# Patient Record
Sex: Female | Born: 1935 | Race: White | Hispanic: No | Smoking: Former smoker
Health system: Southern US, Community
[De-identification: ages and names within clinical notes are randomized; demographics above are authoritative.]

## PROBLEM LIST (undated history)

## (undated) DIAGNOSIS — E039 Hypothyroidism, unspecified: Secondary | ICD-10-CM

## (undated) DIAGNOSIS — E119 Type 2 diabetes mellitus without complications: Secondary | ICD-10-CM

## (undated) DIAGNOSIS — K222 Esophageal obstruction: Secondary | ICD-10-CM

## (undated) DIAGNOSIS — I739 Peripheral vascular disease, unspecified: Secondary | ICD-10-CM

## (undated) DIAGNOSIS — M1990 Unspecified osteoarthritis, unspecified site: Secondary | ICD-10-CM

## (undated) DIAGNOSIS — R0989 Other specified symptoms and signs involving the circulatory and respiratory systems: Secondary | ICD-10-CM

## (undated) DIAGNOSIS — I10 Essential (primary) hypertension: Secondary | ICD-10-CM

## (undated) DIAGNOSIS — I2510 Atherosclerotic heart disease of native coronary artery without angina pectoris: Secondary | ICD-10-CM

## (undated) DIAGNOSIS — K635 Polyp of colon: Secondary | ICD-10-CM

## (undated) DIAGNOSIS — K449 Diaphragmatic hernia without obstruction or gangrene: Secondary | ICD-10-CM

## (undated) DIAGNOSIS — E785 Hyperlipidemia, unspecified: Secondary | ICD-10-CM

## (undated) DIAGNOSIS — I5189 Other ill-defined heart diseases: Secondary | ICD-10-CM

## (undated) DIAGNOSIS — I639 Cerebral infarction, unspecified: Secondary | ICD-10-CM

## (undated) DIAGNOSIS — H9190 Unspecified hearing loss, unspecified ear: Secondary | ICD-10-CM

## (undated) DIAGNOSIS — D649 Anemia, unspecified: Secondary | ICD-10-CM

## (undated) DIAGNOSIS — F419 Anxiety disorder, unspecified: Secondary | ICD-10-CM

## (undated) DIAGNOSIS — Z9289 Personal history of other medical treatment: Secondary | ICD-10-CM

## (undated) DIAGNOSIS — I255 Ischemic cardiomyopathy: Secondary | ICD-10-CM

## (undated) DIAGNOSIS — K219 Gastro-esophageal reflux disease without esophagitis: Secondary | ICD-10-CM

## (undated) DIAGNOSIS — I219 Acute myocardial infarction, unspecified: Secondary | ICD-10-CM

## (undated) DIAGNOSIS — Z950 Presence of cardiac pacemaker: Secondary | ICD-10-CM

## (undated) DIAGNOSIS — K5730 Diverticulosis of large intestine without perforation or abscess without bleeding: Secondary | ICD-10-CM

## (undated) HISTORY — DX: Peripheral vascular disease, unspecified: I73.9

## (undated) HISTORY — DX: Anxiety disorder, unspecified: F41.9

## (undated) HISTORY — PX: BACK SURGERY: SHX140

## (undated) HISTORY — PX: ESOPHAGOGASTRODUODENOSCOPY (EGD) WITH ESOPHAGEAL DILATION: SHX5812

## (undated) HISTORY — PX: CHOLECYSTECTOMY OPEN: SUR202

## (undated) HISTORY — PX: TONSILLECTOMY: SUR1361

## (undated) HISTORY — DX: Cerebral infarction, unspecified: I63.9

## (undated) HISTORY — DX: Gastro-esophageal reflux disease without esophagitis: K21.9

## (undated) HISTORY — DX: Anemia, unspecified: D64.9

## (undated) HISTORY — DX: Other specified symptoms and signs involving the circulatory and respiratory systems: R09.89

## (undated) HISTORY — PX: UPPER GASTROINTESTINAL ENDOSCOPY: SHX188

## (undated) HISTORY — DX: Diaphragmatic hernia without obstruction or gangrene: K44.9

## (undated) HISTORY — DX: Unspecified osteoarthritis, unspecified site: M19.90

## (undated) HISTORY — DX: Acute myocardial infarction, unspecified: I21.9

## (undated) HISTORY — DX: Esophageal obstruction: K22.2

## (undated) HISTORY — DX: Diverticulosis of large intestine without perforation or abscess without bleeding: K57.30

## (undated) HISTORY — PX: COLONOSCOPY: SHX174

## (undated) HISTORY — PX: APPENDECTOMY: SHX54

## (undated) HISTORY — PX: CATARACT EXTRACTION W/ INTRAOCULAR LENS  IMPLANT, BILATERAL: SHX1307

## (undated) HISTORY — DX: Hyperlipidemia, unspecified: E78.5

## (undated) HISTORY — PX: INSERT / REPLACE / REMOVE PACEMAKER: SUR710

## (undated) HISTORY — DX: Polyp of colon: K63.5

## (undated) HISTORY — DX: Unspecified hearing loss, unspecified ear: H91.90

## (undated) HISTORY — PX: CORONARY ARTERY BYPASS GRAFT: SHX141

## (undated) HISTORY — PX: OOPHORECTOMY: SHX86

## (undated) HISTORY — DX: Essential (primary) hypertension: I10

## (undated) HISTORY — PX: ABDOMINAL HYSTERECTOMY: SHX81

---

## 1996-12-06 ENCOUNTER — Encounter (INDEPENDENT_AMBULATORY_CARE_PROVIDER_SITE_OTHER): Payer: Self-pay | Admitting: *Deleted

## 1996-12-06 LAB — CONVERTED CEMR LAB

## 1997-12-31 ENCOUNTER — Encounter (INDEPENDENT_AMBULATORY_CARE_PROVIDER_SITE_OTHER): Payer: Self-pay | Admitting: Gastroenterology

## 1997-12-31 DIAGNOSIS — K649 Unspecified hemorrhoids: Secondary | ICD-10-CM | POA: Insufficient documentation

## 1997-12-31 DIAGNOSIS — K5730 Diverticulosis of large intestine without perforation or abscess without bleeding: Secondary | ICD-10-CM | POA: Insufficient documentation

## 1998-09-20 ENCOUNTER — Ambulatory Visit (HOSPITAL_COMMUNITY): Admission: RE | Admit: 1998-09-20 | Discharge: 1998-09-20 | Payer: Self-pay | Admitting: *Deleted

## 1998-09-26 ENCOUNTER — Ambulatory Visit (HOSPITAL_COMMUNITY): Admission: RE | Admit: 1998-09-26 | Discharge: 1998-09-26 | Payer: Self-pay | Admitting: Internal Medicine

## 1998-09-26 ENCOUNTER — Encounter: Payer: Self-pay | Admitting: Internal Medicine

## 1999-02-09 ENCOUNTER — Other Ambulatory Visit: Admission: RE | Admit: 1999-02-09 | Discharge: 1999-02-09 | Payer: Self-pay | Admitting: *Deleted

## 1999-11-12 ENCOUNTER — Ambulatory Visit (HOSPITAL_COMMUNITY): Admission: RE | Admit: 1999-11-12 | Discharge: 1999-11-12 | Payer: Self-pay | Admitting: Orthopedic Surgery

## 1999-11-12 ENCOUNTER — Encounter: Payer: Self-pay | Admitting: Orthopedic Surgery

## 1999-12-31 ENCOUNTER — Encounter: Payer: Self-pay | Admitting: Orthopedic Surgery

## 1999-12-31 ENCOUNTER — Ambulatory Visit (HOSPITAL_COMMUNITY): Admission: RE | Admit: 1999-12-31 | Discharge: 1999-12-31 | Payer: Self-pay | Admitting: Orthopedic Surgery

## 2000-01-07 HISTORY — PX: LUMBAR DISC SURGERY: SHX700

## 2000-03-31 ENCOUNTER — Encounter: Payer: Self-pay | Admitting: Internal Medicine

## 2000-03-31 ENCOUNTER — Ambulatory Visit (HOSPITAL_COMMUNITY): Admission: RE | Admit: 2000-03-31 | Discharge: 2000-03-31 | Payer: Self-pay | Admitting: Internal Medicine

## 2000-04-05 ENCOUNTER — Encounter: Payer: Self-pay | Admitting: Internal Medicine

## 2000-04-05 ENCOUNTER — Ambulatory Visit (HOSPITAL_COMMUNITY): Admission: RE | Admit: 2000-04-05 | Discharge: 2000-04-05 | Payer: Self-pay | Admitting: Internal Medicine

## 2000-07-05 ENCOUNTER — Other Ambulatory Visit: Admission: RE | Admit: 2000-07-05 | Discharge: 2000-07-05 | Payer: Self-pay | Admitting: *Deleted

## 2000-09-16 ENCOUNTER — Encounter: Payer: Self-pay | Admitting: Orthopedic Surgery

## 2000-09-16 ENCOUNTER — Ambulatory Visit (HOSPITAL_COMMUNITY): Admission: RE | Admit: 2000-09-16 | Discharge: 2000-09-16 | Payer: Self-pay | Admitting: Orthopedic Surgery

## 2000-09-30 ENCOUNTER — Encounter: Payer: Self-pay | Admitting: Orthopedic Surgery

## 2000-09-30 ENCOUNTER — Ambulatory Visit (HOSPITAL_COMMUNITY): Admission: RE | Admit: 2000-09-30 | Discharge: 2000-09-30 | Payer: Self-pay | Admitting: Orthopedic Surgery

## 2000-10-21 ENCOUNTER — Ambulatory Visit (HOSPITAL_COMMUNITY): Admission: RE | Admit: 2000-10-21 | Discharge: 2000-10-21 | Payer: Self-pay | Admitting: Orthopedic Surgery

## 2000-10-21 ENCOUNTER — Encounter: Payer: Self-pay | Admitting: Orthopedic Surgery

## 2001-01-03 ENCOUNTER — Encounter: Admission: RE | Admit: 2001-01-03 | Discharge: 2001-01-03 | Payer: Self-pay | Admitting: Neurology

## 2001-01-03 ENCOUNTER — Encounter: Payer: Self-pay | Admitting: Neurology

## 2001-01-17 ENCOUNTER — Encounter: Admission: RE | Admit: 2001-01-17 | Discharge: 2001-01-17 | Payer: Self-pay | Admitting: Neurology

## 2001-01-17 ENCOUNTER — Encounter: Payer: Self-pay | Admitting: Neurology

## 2001-02-01 ENCOUNTER — Ambulatory Visit (HOSPITAL_COMMUNITY): Admission: RE | Admit: 2001-02-01 | Discharge: 2001-02-01 | Payer: Self-pay | Admitting: Neurology

## 2001-02-01 ENCOUNTER — Encounter: Payer: Self-pay | Admitting: Neurology

## 2001-02-16 ENCOUNTER — Ambulatory Visit (HOSPITAL_COMMUNITY): Admission: RE | Admit: 2001-02-16 | Discharge: 2001-02-16 | Payer: Self-pay | Admitting: Cardiology

## 2001-02-22 ENCOUNTER — Encounter: Payer: Self-pay | Admitting: Surgery

## 2001-02-24 ENCOUNTER — Encounter: Payer: Self-pay | Admitting: Surgery

## 2001-02-24 ENCOUNTER — Inpatient Hospital Stay (HOSPITAL_COMMUNITY): Admission: RE | Admit: 2001-02-24 | Discharge: 2001-02-28 | Payer: Self-pay | Admitting: Surgery

## 2001-02-25 ENCOUNTER — Encounter: Payer: Self-pay | Admitting: Surgery

## 2001-02-26 ENCOUNTER — Encounter: Payer: Self-pay | Admitting: Surgery

## 2001-06-27 ENCOUNTER — Encounter: Payer: Self-pay | Admitting: Internal Medicine

## 2001-06-27 ENCOUNTER — Ambulatory Visit (HOSPITAL_COMMUNITY): Admission: RE | Admit: 2001-06-27 | Discharge: 2001-06-27 | Payer: Self-pay | Admitting: Internal Medicine

## 2001-08-02 ENCOUNTER — Ambulatory Visit (HOSPITAL_COMMUNITY): Admission: RE | Admit: 2001-08-02 | Discharge: 2001-08-02 | Payer: Self-pay | Admitting: Neurological Surgery

## 2001-08-02 ENCOUNTER — Encounter: Payer: Self-pay | Admitting: Neurological Surgery

## 2001-08-24 ENCOUNTER — Encounter: Payer: Self-pay | Admitting: Neurological Surgery

## 2001-08-24 ENCOUNTER — Encounter: Admission: RE | Admit: 2001-08-24 | Discharge: 2001-08-24 | Payer: Self-pay | Admitting: Neurological Surgery

## 2001-12-01 ENCOUNTER — Encounter: Payer: Self-pay | Admitting: Neurological Surgery

## 2001-12-01 ENCOUNTER — Encounter: Admission: RE | Admit: 2001-12-01 | Discharge: 2001-12-01 | Payer: Self-pay | Admitting: Neurological Surgery

## 2002-04-25 ENCOUNTER — Ambulatory Visit (HOSPITAL_COMMUNITY): Admission: RE | Admit: 2002-04-25 | Discharge: 2002-04-25 | Payer: Self-pay | Admitting: Internal Medicine

## 2002-04-26 ENCOUNTER — Ambulatory Visit (HOSPITAL_COMMUNITY): Admission: RE | Admit: 2002-04-26 | Discharge: 2002-04-26 | Payer: Self-pay | Admitting: Internal Medicine

## 2002-04-26 ENCOUNTER — Encounter: Payer: Self-pay | Admitting: Internal Medicine

## 2002-05-30 ENCOUNTER — Ambulatory Visit (HOSPITAL_COMMUNITY): Admission: RE | Admit: 2002-05-30 | Discharge: 2002-05-30 | Payer: Self-pay | Admitting: Internal Medicine

## 2002-05-30 ENCOUNTER — Encounter: Payer: Self-pay | Admitting: Internal Medicine

## 2002-10-19 ENCOUNTER — Encounter: Admission: RE | Admit: 2002-10-19 | Discharge: 2002-10-19 | Payer: Self-pay | Admitting: Internal Medicine

## 2002-10-19 ENCOUNTER — Encounter: Payer: Self-pay | Admitting: Internal Medicine

## 2003-01-21 ENCOUNTER — Encounter: Payer: Self-pay | Admitting: Neurological Surgery

## 2003-01-21 ENCOUNTER — Encounter: Admission: RE | Admit: 2003-01-21 | Discharge: 2003-01-21 | Payer: Self-pay | Admitting: Neurological Surgery

## 2003-01-28 ENCOUNTER — Encounter: Payer: Self-pay | Admitting: Neurological Surgery

## 2003-01-28 ENCOUNTER — Encounter: Admission: RE | Admit: 2003-01-28 | Discharge: 2003-01-28 | Payer: Self-pay | Admitting: Neurological Surgery

## 2003-06-17 ENCOUNTER — Encounter: Payer: Self-pay | Admitting: Neurological Surgery

## 2003-06-17 ENCOUNTER — Encounter: Admission: RE | Admit: 2003-06-17 | Discharge: 2003-06-17 | Payer: Self-pay | Admitting: Neurological Surgery

## 2003-08-05 ENCOUNTER — Encounter: Admission: RE | Admit: 2003-08-05 | Discharge: 2003-08-05 | Payer: Self-pay | Admitting: Neurological Surgery

## 2003-08-05 ENCOUNTER — Encounter: Payer: Self-pay | Admitting: Neurological Surgery

## 2003-10-03 ENCOUNTER — Encounter: Admission: RE | Admit: 2003-10-03 | Discharge: 2003-10-03 | Payer: Self-pay | Admitting: Neurological Surgery

## 2003-12-15 ENCOUNTER — Emergency Department (HOSPITAL_COMMUNITY): Admission: AD | Admit: 2003-12-15 | Discharge: 2003-12-15 | Payer: Self-pay | Admitting: Family Medicine

## 2004-01-06 ENCOUNTER — Encounter: Admission: RE | Admit: 2004-01-06 | Discharge: 2004-01-06 | Payer: Self-pay | Admitting: Neurological Surgery

## 2004-05-06 ENCOUNTER — Emergency Department (HOSPITAL_COMMUNITY): Admission: EM | Admit: 2004-05-06 | Discharge: 2004-05-06 | Payer: Self-pay | Admitting: Family Medicine

## 2004-05-11 ENCOUNTER — Emergency Department (HOSPITAL_COMMUNITY): Admission: EM | Admit: 2004-05-11 | Discharge: 2004-05-11 | Payer: Self-pay | Admitting: Family Medicine

## 2004-06-10 ENCOUNTER — Ambulatory Visit (HOSPITAL_COMMUNITY): Admission: RE | Admit: 2004-06-10 | Discharge: 2004-06-10 | Payer: Self-pay | Admitting: Plastic Surgery

## 2004-06-10 ENCOUNTER — Encounter (INDEPENDENT_AMBULATORY_CARE_PROVIDER_SITE_OTHER): Payer: Self-pay | Admitting: Specialist

## 2004-06-10 ENCOUNTER — Ambulatory Visit (HOSPITAL_BASED_OUTPATIENT_CLINIC_OR_DEPARTMENT_OTHER): Admission: RE | Admit: 2004-06-10 | Discharge: 2004-06-10 | Payer: Self-pay | Admitting: Plastic Surgery

## 2004-06-11 ENCOUNTER — Encounter: Admission: RE | Admit: 2004-06-11 | Discharge: 2004-06-11 | Payer: Self-pay | Admitting: Neurological Surgery

## 2004-10-21 ENCOUNTER — Ambulatory Visit: Payer: Self-pay | Admitting: Internal Medicine

## 2004-11-18 ENCOUNTER — Encounter: Admission: RE | Admit: 2004-11-18 | Discharge: 2004-11-18 | Payer: Self-pay | Admitting: Neurological Surgery

## 2005-01-08 ENCOUNTER — Ambulatory Visit: Payer: Self-pay | Admitting: Internal Medicine

## 2005-01-28 ENCOUNTER — Encounter: Admission: RE | Admit: 2005-01-28 | Discharge: 2005-01-28 | Payer: Self-pay | Admitting: Neurological Surgery

## 2005-03-30 ENCOUNTER — Ambulatory Visit: Payer: Self-pay | Admitting: *Deleted

## 2005-04-05 ENCOUNTER — Ambulatory Visit: Payer: Self-pay | Admitting: Internal Medicine

## 2005-04-13 ENCOUNTER — Ambulatory Visit: Payer: Self-pay | Admitting: Internal Medicine

## 2005-05-22 ENCOUNTER — Emergency Department (HOSPITAL_COMMUNITY): Admission: EM | Admit: 2005-05-22 | Discharge: 2005-05-22 | Payer: Self-pay | Admitting: Emergency Medicine

## 2005-05-25 ENCOUNTER — Emergency Department (HOSPITAL_COMMUNITY): Admission: EM | Admit: 2005-05-25 | Discharge: 2005-05-25 | Payer: Self-pay | Admitting: Family Medicine

## 2005-07-14 ENCOUNTER — Emergency Department (HOSPITAL_COMMUNITY): Admission: EM | Admit: 2005-07-14 | Discharge: 2005-07-14 | Payer: Self-pay | Admitting: Emergency Medicine

## 2005-08-17 ENCOUNTER — Ambulatory Visit: Payer: Self-pay | Admitting: *Deleted

## 2005-08-24 ENCOUNTER — Ambulatory Visit: Payer: Self-pay | Admitting: Internal Medicine

## 2005-09-07 ENCOUNTER — Ambulatory Visit: Payer: Self-pay

## 2005-09-20 ENCOUNTER — Encounter: Admission: RE | Admit: 2005-09-20 | Discharge: 2005-09-20 | Payer: Self-pay | Admitting: Neurosurgery

## 2005-10-06 ENCOUNTER — Ambulatory Visit: Payer: Self-pay | Admitting: Internal Medicine

## 2005-10-18 ENCOUNTER — Ambulatory Visit: Payer: Self-pay | Admitting: Internal Medicine

## 2005-12-08 ENCOUNTER — Ambulatory Visit: Payer: Self-pay | Admitting: *Deleted

## 2005-12-15 ENCOUNTER — Ambulatory Visit: Payer: Self-pay | Admitting: Internal Medicine

## 2005-12-21 ENCOUNTER — Ambulatory Visit: Payer: Self-pay | Admitting: Internal Medicine

## 2006-01-14 ENCOUNTER — Ambulatory Visit: Payer: Self-pay | Admitting: Internal Medicine

## 2006-01-25 ENCOUNTER — Ambulatory Visit: Payer: Self-pay | Admitting: *Deleted

## 2006-02-16 ENCOUNTER — Encounter (HOSPITAL_COMMUNITY): Admission: RE | Admit: 2006-02-16 | Discharge: 2006-05-17 | Payer: Self-pay | Admitting: Obstetrics and Gynecology

## 2006-02-22 ENCOUNTER — Ambulatory Visit: Payer: Self-pay | Admitting: Internal Medicine

## 2006-03-15 ENCOUNTER — Encounter: Admission: RE | Admit: 2006-03-15 | Discharge: 2006-03-15 | Payer: Self-pay | Admitting: Neurosurgery

## 2006-04-14 ENCOUNTER — Ambulatory Visit: Payer: Self-pay | Admitting: Internal Medicine

## 2006-04-26 ENCOUNTER — Ambulatory Visit: Payer: Self-pay | Admitting: *Deleted

## 2006-04-28 ENCOUNTER — Ambulatory Visit: Payer: Self-pay | Admitting: Internal Medicine

## 2006-04-28 ENCOUNTER — Ambulatory Visit: Payer: Self-pay | Admitting: *Deleted

## 2006-05-18 ENCOUNTER — Encounter (HOSPITAL_COMMUNITY): Admission: RE | Admit: 2006-05-18 | Discharge: 2006-08-16 | Payer: Self-pay | Admitting: Obstetrics and Gynecology

## 2006-06-14 ENCOUNTER — Ambulatory Visit: Payer: Self-pay | Admitting: Internal Medicine

## 2006-06-28 ENCOUNTER — Ambulatory Visit (HOSPITAL_COMMUNITY): Admission: RE | Admit: 2006-06-28 | Discharge: 2006-06-28 | Payer: Self-pay | Admitting: Internal Medicine

## 2006-06-28 ENCOUNTER — Encounter: Payer: Self-pay | Admitting: Internal Medicine

## 2006-07-05 ENCOUNTER — Ambulatory Visit: Payer: Self-pay | Admitting: Internal Medicine

## 2006-09-07 ENCOUNTER — Encounter: Admission: RE | Admit: 2006-09-07 | Discharge: 2006-09-07 | Payer: Self-pay | Admitting: Neurosurgery

## 2006-11-01 ENCOUNTER — Ambulatory Visit: Payer: Self-pay | Admitting: *Deleted

## 2006-11-15 ENCOUNTER — Ambulatory Visit: Payer: Self-pay

## 2006-11-15 ENCOUNTER — Ambulatory Visit: Payer: Self-pay | Admitting: *Deleted

## 2006-12-01 ENCOUNTER — Encounter (HOSPITAL_COMMUNITY): Admission: RE | Admit: 2006-12-01 | Discharge: 2007-03-01 | Payer: Self-pay | Admitting: Obstetrics and Gynecology

## 2007-01-07 ENCOUNTER — Emergency Department (HOSPITAL_COMMUNITY): Admission: EM | Admit: 2007-01-07 | Discharge: 2007-01-08 | Payer: Self-pay | Admitting: Emergency Medicine

## 2007-01-16 ENCOUNTER — Emergency Department (HOSPITAL_COMMUNITY): Admission: EM | Admit: 2007-01-16 | Discharge: 2007-01-16 | Payer: Self-pay | Admitting: Family Medicine

## 2007-01-26 ENCOUNTER — Encounter: Payer: Self-pay | Admitting: Internal Medicine

## 2007-01-26 ENCOUNTER — Ambulatory Visit (HOSPITAL_COMMUNITY): Admission: EM | Admit: 2007-01-26 | Discharge: 2007-01-27 | Payer: Self-pay | Admitting: Emergency Medicine

## 2007-02-02 ENCOUNTER — Ambulatory Visit: Payer: Self-pay | Admitting: Internal Medicine

## 2007-02-16 ENCOUNTER — Ambulatory Visit (HOSPITAL_COMMUNITY): Admission: RE | Admit: 2007-02-16 | Discharge: 2007-02-16 | Payer: Self-pay | Admitting: Internal Medicine

## 2007-02-16 ENCOUNTER — Encounter: Payer: Self-pay | Admitting: Internal Medicine

## 2007-02-16 DIAGNOSIS — K449 Diaphragmatic hernia without obstruction or gangrene: Secondary | ICD-10-CM | POA: Insufficient documentation

## 2007-02-21 ENCOUNTER — Encounter: Admission: RE | Admit: 2007-02-21 | Discharge: 2007-02-21 | Payer: Self-pay | Admitting: Neurosurgery

## 2007-03-16 DIAGNOSIS — I252 Old myocardial infarction: Secondary | ICD-10-CM

## 2007-03-16 DIAGNOSIS — E1142 Type 2 diabetes mellitus with diabetic polyneuropathy: Secondary | ICD-10-CM

## 2007-03-16 DIAGNOSIS — E781 Pure hyperglyceridemia: Secondary | ICD-10-CM | POA: Insufficient documentation

## 2007-03-16 DIAGNOSIS — K222 Esophageal obstruction: Secondary | ICD-10-CM | POA: Insufficient documentation

## 2007-03-16 DIAGNOSIS — K219 Gastro-esophageal reflux disease without esophagitis: Secondary | ICD-10-CM

## 2007-03-28 ENCOUNTER — Ambulatory Visit: Payer: Self-pay | Admitting: *Deleted

## 2007-04-04 ENCOUNTER — Ambulatory Visit: Payer: Self-pay | Admitting: *Deleted

## 2007-04-04 LAB — CONVERTED CEMR LAB
Chloride: 104 meq/L (ref 96–112)
Creatinine, Ser: 0.9 mg/dL (ref 0.4–1.2)
GFR calc non Af Amer: 65 mL/min
Glucose, Bld: 115 mg/dL — ABNORMAL HIGH (ref 70–99)
Potassium: 4.3 meq/L (ref 3.5–5.1)
Sodium: 140 meq/L (ref 135–145)

## 2007-04-13 ENCOUNTER — Ambulatory Visit: Payer: Self-pay | Admitting: Internal Medicine

## 2007-04-13 ENCOUNTER — Observation Stay (HOSPITAL_COMMUNITY): Admission: EM | Admit: 2007-04-13 | Discharge: 2007-04-14 | Payer: Self-pay | Admitting: Emergency Medicine

## 2007-04-27 ENCOUNTER — Ambulatory Visit: Payer: Self-pay

## 2007-05-02 ENCOUNTER — Ambulatory Visit: Payer: Self-pay | Admitting: Cardiovascular Disease

## 2007-05-11 ENCOUNTER — Encounter: Payer: Self-pay | Admitting: Internal Medicine

## 2007-05-16 ENCOUNTER — Telehealth (INDEPENDENT_AMBULATORY_CARE_PROVIDER_SITE_OTHER): Payer: Self-pay | Admitting: *Deleted

## 2007-05-17 ENCOUNTER — Encounter: Payer: Self-pay | Admitting: Internal Medicine

## 2007-05-19 ENCOUNTER — Telehealth (INDEPENDENT_AMBULATORY_CARE_PROVIDER_SITE_OTHER): Payer: Self-pay | Admitting: *Deleted

## 2007-06-14 ENCOUNTER — Ambulatory Visit: Payer: Self-pay | Admitting: *Deleted

## 2007-06-14 ENCOUNTER — Ambulatory Visit: Payer: Self-pay | Admitting: Internal Medicine

## 2007-06-14 LAB — CONVERTED CEMR LAB
ALT: 33 units/L (ref 0–35)
AST: 29 units/L (ref 0–37)
Alkaline Phosphatase: 74 units/L (ref 39–117)
Cholesterol: 204 mg/dL (ref 0–200)
Total CHOL/HDL Ratio: 4.9
VLDL: 43 mg/dL — ABNORMAL HIGH (ref 0–40)

## 2007-06-19 ENCOUNTER — Telehealth (INDEPENDENT_AMBULATORY_CARE_PROVIDER_SITE_OTHER): Payer: Self-pay | Admitting: *Deleted

## 2007-07-11 ENCOUNTER — Encounter (HOSPITAL_COMMUNITY): Admission: RE | Admit: 2007-07-11 | Discharge: 2007-09-11 | Payer: Self-pay | Admitting: Obstetrics and Gynecology

## 2007-07-11 ENCOUNTER — Encounter: Payer: Self-pay | Admitting: Internal Medicine

## 2007-08-01 ENCOUNTER — Encounter: Admission: RE | Admit: 2007-08-01 | Discharge: 2007-08-01 | Payer: Self-pay | Admitting: Neurological Surgery

## 2007-09-04 ENCOUNTER — Ambulatory Visit: Payer: Self-pay | Admitting: Psychology

## 2007-09-04 ENCOUNTER — Encounter: Admission: RE | Admit: 2007-09-04 | Discharge: 2007-09-04 | Payer: Self-pay | Admitting: Neurology

## 2007-09-07 ENCOUNTER — Ambulatory Visit: Payer: Self-pay | Admitting: Internal Medicine

## 2007-09-07 DIAGNOSIS — I10 Essential (primary) hypertension: Secondary | ICD-10-CM

## 2007-09-07 LAB — CONVERTED CEMR LAB
HDL goal, serum: 40 mg/dL
LDL Goal: 100 mg/dL

## 2007-09-08 ENCOUNTER — Ambulatory Visit: Payer: Self-pay | Admitting: Internal Medicine

## 2007-09-12 ENCOUNTER — Encounter (INDEPENDENT_AMBULATORY_CARE_PROVIDER_SITE_OTHER): Payer: Self-pay | Admitting: *Deleted

## 2007-09-12 LAB — CONVERTED CEMR LAB
AST: 44 units/L — ABNORMAL HIGH (ref 0–37)
Basophils Absolute: 0 10*3/uL (ref 0.0–0.1)
Cholesterol: 185 mg/dL (ref 0–200)
Creatinine,U: 141.2 mg/dL
Eosinophils Absolute: 0.2 10*3/uL (ref 0.0–0.6)
Eosinophils Relative: 2.5 % (ref 0.0–5.0)
HDL: 35.7 mg/dL — ABNORMAL LOW (ref >39.0)
Lymphocytes Relative: 34.7 % (ref 12.0–46.0)
MCHC: 34.6 g/dL (ref 30.0–36.0)
MCV: 89.7 fL (ref 78.0–100.0)
Microalb, Ur: 1 mg/dL (ref 0.0–1.9)
Monocytes Relative: 10.1 % (ref 3.0–11.0)
Neutro Abs: 3.4 10*3/uL (ref 1.4–7.7)
Platelets: 266 10*3/uL (ref 150–400)
Potassium: 4.3 meq/L (ref 3.5–5.1)
RBC: 4.67 M/uL (ref 3.87–5.11)
TSH: 0.03 microintl units/mL — ABNORMAL LOW (ref 0.35–5.50)
Total CHOL/HDL Ratio: 5.2
Triglycerides: 181 mg/dL — ABNORMAL HIGH (ref 0–149)

## 2007-09-14 ENCOUNTER — Encounter: Payer: Self-pay | Admitting: Internal Medicine

## 2007-09-25 ENCOUNTER — Ambulatory Visit: Payer: Self-pay | Admitting: Internal Medicine

## 2007-09-25 DIAGNOSIS — E782 Mixed hyperlipidemia: Secondary | ICD-10-CM | POA: Insufficient documentation

## 2007-09-25 DIAGNOSIS — E039 Hypothyroidism, unspecified: Secondary | ICD-10-CM | POA: Insufficient documentation

## 2007-10-09 ENCOUNTER — Ambulatory Visit: Payer: Self-pay | Admitting: Cardiovascular Disease

## 2008-01-04 ENCOUNTER — Ambulatory Visit: Payer: Self-pay | Admitting: Internal Medicine

## 2008-01-04 ENCOUNTER — Encounter (INDEPENDENT_AMBULATORY_CARE_PROVIDER_SITE_OTHER): Payer: Self-pay | Admitting: *Deleted

## 2008-01-04 DIAGNOSIS — H9190 Unspecified hearing loss, unspecified ear: Secondary | ICD-10-CM | POA: Insufficient documentation

## 2008-01-04 DIAGNOSIS — I739 Peripheral vascular disease, unspecified: Secondary | ICD-10-CM

## 2008-01-07 LAB — CONVERTED CEMR LAB
Creatinine, Ser: 0.9 mg/dL (ref 0.4–1.2)
Microalb, Ur: 2.1 mg/dL — ABNORMAL HIGH (ref 0.0–1.9)

## 2008-01-08 ENCOUNTER — Encounter (INDEPENDENT_AMBULATORY_CARE_PROVIDER_SITE_OTHER): Payer: Self-pay | Admitting: *Deleted

## 2008-01-10 ENCOUNTER — Encounter: Payer: Self-pay | Admitting: Internal Medicine

## 2008-01-10 ENCOUNTER — Ambulatory Visit: Payer: Self-pay

## 2008-01-12 ENCOUNTER — Encounter: Admission: RE | Admit: 2008-01-12 | Discharge: 2008-01-12 | Payer: Self-pay | Admitting: Neurological Surgery

## 2008-02-01 ENCOUNTER — Ambulatory Visit: Payer: Self-pay | Admitting: Cardiology

## 2008-02-14 DIAGNOSIS — J209 Acute bronchitis, unspecified: Secondary | ICD-10-CM | POA: Insufficient documentation

## 2008-02-14 DIAGNOSIS — K208 Other esophagitis: Secondary | ICD-10-CM

## 2008-02-14 DIAGNOSIS — M1990 Unspecified osteoarthritis, unspecified site: Secondary | ICD-10-CM | POA: Insufficient documentation

## 2008-02-14 DIAGNOSIS — K5909 Other constipation: Secondary | ICD-10-CM

## 2008-02-14 DIAGNOSIS — F419 Anxiety disorder, unspecified: Secondary | ICD-10-CM

## 2008-02-21 ENCOUNTER — Ambulatory Visit: Payer: Self-pay | Admitting: Internal Medicine

## 2008-02-29 ENCOUNTER — Ambulatory Visit: Payer: Self-pay

## 2008-03-01 ENCOUNTER — Ambulatory Visit: Payer: Self-pay | Admitting: Internal Medicine

## 2008-03-02 LAB — CONVERTED CEMR LAB
BUN: 18 mg/dL (ref 6–23)
Creatinine, Ser: 0.8 mg/dL (ref 0.4–1.2)
Potassium: 4.6 meq/L (ref 3.5–5.1)

## 2008-03-04 ENCOUNTER — Encounter (INDEPENDENT_AMBULATORY_CARE_PROVIDER_SITE_OTHER): Payer: Self-pay | Admitting: *Deleted

## 2008-03-05 ENCOUNTER — Encounter (HOSPITAL_COMMUNITY): Admission: RE | Admit: 2008-03-05 | Discharge: 2008-03-05 | Payer: Self-pay | Admitting: Obstetrics and Gynecology

## 2008-03-07 ENCOUNTER — Telehealth: Payer: Self-pay | Admitting: Internal Medicine

## 2008-03-07 ENCOUNTER — Telehealth (INDEPENDENT_AMBULATORY_CARE_PROVIDER_SITE_OTHER): Payer: Self-pay | Admitting: *Deleted

## 2008-04-11 ENCOUNTER — Telehealth (INDEPENDENT_AMBULATORY_CARE_PROVIDER_SITE_OTHER): Payer: Self-pay | Admitting: *Deleted

## 2008-06-03 ENCOUNTER — Telehealth (INDEPENDENT_AMBULATORY_CARE_PROVIDER_SITE_OTHER): Payer: Self-pay | Admitting: *Deleted

## 2008-06-03 ENCOUNTER — Encounter: Payer: Self-pay | Admitting: Internal Medicine

## 2008-06-19 ENCOUNTER — Ambulatory Visit: Payer: Self-pay | Admitting: Internal Medicine

## 2008-06-19 DIAGNOSIS — R1310 Dysphagia, unspecified: Secondary | ICD-10-CM | POA: Insufficient documentation

## 2008-06-19 DIAGNOSIS — R1319 Other dysphagia: Secondary | ICD-10-CM | POA: Insufficient documentation

## 2008-06-25 ENCOUNTER — Ambulatory Visit (HOSPITAL_COMMUNITY): Admission: RE | Admit: 2008-06-25 | Discharge: 2008-06-25 | Payer: Self-pay | Admitting: Internal Medicine

## 2008-06-26 ENCOUNTER — Encounter: Payer: Self-pay | Admitting: Internal Medicine

## 2008-07-01 ENCOUNTER — Ambulatory Visit: Payer: Self-pay | Admitting: Internal Medicine

## 2008-07-05 ENCOUNTER — Encounter: Payer: Self-pay | Admitting: Internal Medicine

## 2008-07-08 ENCOUNTER — Telehealth (INDEPENDENT_AMBULATORY_CARE_PROVIDER_SITE_OTHER): Payer: Self-pay | Admitting: *Deleted

## 2008-07-11 ENCOUNTER — Telehealth (INDEPENDENT_AMBULATORY_CARE_PROVIDER_SITE_OTHER): Payer: Self-pay | Admitting: *Deleted

## 2008-07-17 ENCOUNTER — Ambulatory Visit: Payer: Self-pay | Admitting: Internal Medicine

## 2008-07-18 ENCOUNTER — Ambulatory Visit: Payer: Self-pay | Admitting: Internal Medicine

## 2008-07-21 LAB — CONVERTED CEMR LAB
ALT: 31 units/L (ref 0–35)
Cholesterol: 169 mg/dL (ref 0–200)
Eosinophils Relative: 4 % (ref 0.0–5.0)
HCT: 40.4 % (ref 36.0–46.0)
Hemoglobin: 14.3 g/dL (ref 12.0–15.0)
Hgb A1c MFr Bld: 6.9 % — ABNORMAL HIGH (ref 4.6–6.0)
Microalb Creat Ratio: 22.8 mg/g (ref 0.0–30.0)
Monocytes Absolute: 0.5 10*3/uL (ref 0.1–1.0)
Monocytes Relative: 8.5 % (ref 3.0–12.0)
Neutro Abs: 3.8 10*3/uL (ref 1.4–7.7)
Potassium: 4.2 meq/L (ref 3.5–5.1)
RDW: 12.5 % (ref 11.5–14.6)
Total CHOL/HDL Ratio: 5.7
Total Protein: 6.6 g/dL (ref 6.0–8.3)
WBC: 6.1 10*3/uL (ref 4.5–10.5)

## 2008-07-22 ENCOUNTER — Encounter (INDEPENDENT_AMBULATORY_CARE_PROVIDER_SITE_OTHER): Payer: Self-pay | Admitting: *Deleted

## 2008-07-25 ENCOUNTER — Encounter (INDEPENDENT_AMBULATORY_CARE_PROVIDER_SITE_OTHER): Payer: Self-pay | Admitting: *Deleted

## 2008-07-25 ENCOUNTER — Ambulatory Visit: Payer: Self-pay | Admitting: Internal Medicine

## 2008-07-25 LAB — CONVERTED CEMR LAB: LDL Goal: 70 mg/dL

## 2008-07-26 ENCOUNTER — Encounter: Admission: RE | Admit: 2008-07-26 | Discharge: 2008-07-26 | Payer: Self-pay | Admitting: Neurological Surgery

## 2008-07-29 ENCOUNTER — Encounter (INDEPENDENT_AMBULATORY_CARE_PROVIDER_SITE_OTHER): Payer: Self-pay | Admitting: *Deleted

## 2008-09-10 ENCOUNTER — Encounter: Payer: Self-pay | Admitting: Internal Medicine

## 2008-09-25 ENCOUNTER — Encounter: Payer: Self-pay | Admitting: Internal Medicine

## 2008-10-01 ENCOUNTER — Ambulatory Visit: Payer: Self-pay | Admitting: Cardiovascular Disease

## 2008-12-04 ENCOUNTER — Telehealth: Payer: Self-pay | Admitting: Internal Medicine

## 2009-01-02 ENCOUNTER — Encounter: Payer: Self-pay | Admitting: Internal Medicine

## 2009-01-03 ENCOUNTER — Telehealth (INDEPENDENT_AMBULATORY_CARE_PROVIDER_SITE_OTHER): Payer: Self-pay | Admitting: *Deleted

## 2009-01-14 ENCOUNTER — Encounter: Payer: Self-pay | Admitting: Internal Medicine

## 2009-01-16 ENCOUNTER — Encounter: Admission: RE | Admit: 2009-01-16 | Discharge: 2009-01-16 | Payer: Self-pay | Admitting: Neurological Surgery

## 2009-01-22 ENCOUNTER — Ambulatory Visit (HOSPITAL_COMMUNITY): Admission: RE | Admit: 2009-01-22 | Discharge: 2009-01-22 | Payer: Self-pay | Admitting: Obstetrics and Gynecology

## 2009-01-30 ENCOUNTER — Ambulatory Visit: Payer: Self-pay | Admitting: Internal Medicine

## 2009-01-30 DIAGNOSIS — E119 Type 2 diabetes mellitus without complications: Secondary | ICD-10-CM

## 2009-01-30 LAB — CONVERTED CEMR LAB
Bilirubin Urine: NEGATIVE
Glucose, Urine, Semiquant: NEGATIVE
Urobilinogen, UA: 0.2
pH: 6

## 2009-01-31 ENCOUNTER — Encounter: Payer: Self-pay | Admitting: Internal Medicine

## 2009-02-01 ENCOUNTER — Encounter: Payer: Self-pay | Admitting: Internal Medicine

## 2009-02-05 ENCOUNTER — Telehealth (INDEPENDENT_AMBULATORY_CARE_PROVIDER_SITE_OTHER): Payer: Self-pay | Admitting: *Deleted

## 2009-03-05 ENCOUNTER — Encounter: Admission: RE | Admit: 2009-03-05 | Discharge: 2009-03-05 | Payer: Self-pay | Admitting: Neurological Surgery

## 2009-03-12 ENCOUNTER — Encounter: Payer: Self-pay | Admitting: Internal Medicine

## 2009-03-31 ENCOUNTER — Telehealth: Payer: Self-pay | Admitting: Internal Medicine

## 2009-04-02 ENCOUNTER — Encounter: Payer: Self-pay | Admitting: Internal Medicine

## 2009-04-07 ENCOUNTER — Ambulatory Visit: Payer: Self-pay | Admitting: Internal Medicine

## 2009-04-08 LAB — CONVERTED CEMR LAB
AST: 33 units/L (ref 0–37)
Albumin: 3.9 g/dL (ref 3.5–5.2)
Alkaline Phosphatase: 34 units/L — ABNORMAL LOW (ref 39–117)
Basophils Absolute: 0 10*3/uL (ref 0.0–0.1)
Bilirubin, Direct: 0.2 mg/dL (ref 0.0–0.3)
Calcium: 9.4 mg/dL (ref 8.4–10.5)
GFR calc non Af Amer: 57.07 mL/min (ref >60)
Glucose, Bld: 126 mg/dL — ABNORMAL HIGH (ref 70–99)
Hemoglobin: 13.5 g/dL (ref 12.0–15.0)
Lymphocytes Relative: 29.6 % (ref 12.0–46.0)
Monocytes Relative: 8.3 % (ref 3.0–12.0)
Neutro Abs: 2.9 10*3/uL (ref 1.4–7.7)
RDW: 13.5 % (ref 11.5–14.6)
Sodium: 141 meq/L (ref 135–145)
Total Bilirubin: 0.7 mg/dL (ref 0.3–1.2)

## 2009-04-10 ENCOUNTER — Ambulatory Visit: Payer: Self-pay | Admitting: Internal Medicine

## 2009-04-21 ENCOUNTER — Telehealth: Payer: Self-pay | Admitting: Internal Medicine

## 2009-04-22 ENCOUNTER — Encounter: Payer: Self-pay | Admitting: Internal Medicine

## 2009-04-22 ENCOUNTER — Ambulatory Visit: Payer: Self-pay | Admitting: Internal Medicine

## 2009-05-19 ENCOUNTER — Telehealth (INDEPENDENT_AMBULATORY_CARE_PROVIDER_SITE_OTHER): Payer: Self-pay | Admitting: *Deleted

## 2009-05-21 ENCOUNTER — Encounter: Payer: Self-pay | Admitting: Internal Medicine

## 2009-05-22 ENCOUNTER — Telehealth (INDEPENDENT_AMBULATORY_CARE_PROVIDER_SITE_OTHER): Payer: Self-pay | Admitting: *Deleted

## 2009-06-05 ENCOUNTER — Encounter: Admission: RE | Admit: 2009-06-05 | Discharge: 2009-06-05 | Payer: Self-pay | Admitting: Neurological Surgery

## 2009-06-26 ENCOUNTER — Telehealth: Payer: Self-pay | Admitting: Internal Medicine

## 2009-06-27 ENCOUNTER — Telehealth (INDEPENDENT_AMBULATORY_CARE_PROVIDER_SITE_OTHER): Payer: Self-pay | Admitting: *Deleted

## 2009-07-02 ENCOUNTER — Encounter: Payer: Self-pay | Admitting: Internal Medicine

## 2009-07-31 ENCOUNTER — Encounter: Admission: RE | Admit: 2009-07-31 | Discharge: 2009-07-31 | Payer: Self-pay | Admitting: Orthopedic Surgery

## 2009-07-31 ENCOUNTER — Encounter: Payer: Self-pay | Admitting: Internal Medicine

## 2009-08-04 ENCOUNTER — Telehealth: Payer: Self-pay | Admitting: Cardiovascular Disease

## 2009-08-06 ENCOUNTER — Telehealth (INDEPENDENT_AMBULATORY_CARE_PROVIDER_SITE_OTHER): Payer: Self-pay | Admitting: *Deleted

## 2009-08-20 ENCOUNTER — Ambulatory Visit: Payer: Self-pay | Admitting: Internal Medicine

## 2009-09-24 DIAGNOSIS — I25810 Atherosclerosis of coronary artery bypass graft(s) without angina pectoris: Secondary | ICD-10-CM | POA: Insufficient documentation

## 2009-09-24 DIAGNOSIS — R0989 Other specified symptoms and signs involving the circulatory and respiratory systems: Secondary | ICD-10-CM

## 2009-10-09 ENCOUNTER — Ambulatory Visit: Payer: Self-pay | Admitting: Internal Medicine

## 2009-11-06 ENCOUNTER — Ambulatory Visit: Payer: Self-pay | Admitting: Cardiovascular Disease

## 2009-11-12 ENCOUNTER — Ambulatory Visit: Payer: Self-pay | Admitting: Cardiovascular Disease

## 2009-11-25 LAB — CONVERTED CEMR LAB
ALT: 19 units/L (ref 0–35)
AST: 24 units/L (ref 0–37)
Albumin: 4.1 g/dL (ref 3.5–5.2)
Alkaline Phosphatase: 71 units/L (ref 39–117)
Bilirubin, Direct: 0 mg/dL (ref 0.0–0.3)
Cholesterol: 191 mg/dL (ref 0–200)
Direct LDL: 116.8 mg/dL
HDL: 34 mg/dL — ABNORMAL LOW (ref >39.00)
Total Bilirubin: 1.2 mg/dL (ref 0.3–1.2)
Total CHOL/HDL Ratio: 6
Total Protein: 7 g/dL (ref 6.0–8.3)
Triglycerides: 313 mg/dL — ABNORMAL HIGH (ref 0.0–149.0)
VLDL: 62.6 mg/dL — ABNORMAL HIGH (ref 0.0–40.0)

## 2009-12-01 ENCOUNTER — Telehealth: Payer: Self-pay | Admitting: Internal Medicine

## 2009-12-02 ENCOUNTER — Encounter (INDEPENDENT_AMBULATORY_CARE_PROVIDER_SITE_OTHER): Payer: Self-pay | Admitting: *Deleted

## 2009-12-03 ENCOUNTER — Ambulatory Visit: Payer: Self-pay | Admitting: Internal Medicine

## 2009-12-03 ENCOUNTER — Encounter (INDEPENDENT_AMBULATORY_CARE_PROVIDER_SITE_OTHER): Payer: Self-pay | Admitting: *Deleted

## 2009-12-10 ENCOUNTER — Ambulatory Visit: Payer: Self-pay | Admitting: Internal Medicine

## 2009-12-11 ENCOUNTER — Ambulatory Visit: Payer: Self-pay | Admitting: Internal Medicine

## 2009-12-11 DIAGNOSIS — G479 Sleep disorder, unspecified: Secondary | ICD-10-CM | POA: Insufficient documentation

## 2009-12-17 ENCOUNTER — Telehealth: Payer: Self-pay | Admitting: Internal Medicine

## 2009-12-17 DIAGNOSIS — R259 Unspecified abnormal involuntary movements: Secondary | ICD-10-CM | POA: Insufficient documentation

## 2009-12-23 ENCOUNTER — Encounter (INDEPENDENT_AMBULATORY_CARE_PROVIDER_SITE_OTHER): Payer: Self-pay | Admitting: *Deleted

## 2009-12-30 ENCOUNTER — Telehealth: Payer: Self-pay | Admitting: Internal Medicine

## 2010-01-09 ENCOUNTER — Encounter: Payer: Self-pay | Admitting: Internal Medicine

## 2010-01-12 ENCOUNTER — Telehealth (INDEPENDENT_AMBULATORY_CARE_PROVIDER_SITE_OTHER): Payer: Self-pay | Admitting: *Deleted

## 2010-01-16 ENCOUNTER — Encounter: Payer: Self-pay | Admitting: Internal Medicine

## 2010-01-23 ENCOUNTER — Ambulatory Visit (HOSPITAL_COMMUNITY): Admission: RE | Admit: 2010-01-23 | Discharge: 2010-01-23 | Payer: Self-pay | Admitting: Obstetrics and Gynecology

## 2010-01-27 ENCOUNTER — Encounter: Payer: Self-pay | Admitting: Internal Medicine

## 2010-02-05 ENCOUNTER — Encounter: Payer: Self-pay | Admitting: Internal Medicine

## 2010-02-23 ENCOUNTER — Ambulatory Visit: Payer: Self-pay | Admitting: Cardiovascular Disease

## 2010-03-06 LAB — CONVERTED CEMR LAB
ALT: 16 units/L (ref 0–35)
AST: 17 units/L (ref 0–37)
Alkaline Phosphatase: 60 units/L (ref 39–117)
Bilirubin, Direct: 0.1 mg/dL (ref 0.0–0.3)
Direct LDL: 98.6 mg/dL
Total Bilirubin: 0.8 mg/dL (ref 0.3–1.2)
Total CHOL/HDL Ratio: 3
VLDL: 41.2 mg/dL — ABNORMAL HIGH (ref 0.0–40.0)

## 2010-03-13 ENCOUNTER — Encounter: Admission: RE | Admit: 2010-03-13 | Discharge: 2010-03-13 | Payer: Self-pay | Admitting: Neurological Surgery

## 2010-04-06 ENCOUNTER — Ambulatory Visit: Payer: Self-pay | Admitting: Internal Medicine

## 2010-04-07 ENCOUNTER — Ambulatory Visit: Payer: Self-pay | Admitting: Internal Medicine

## 2010-05-12 ENCOUNTER — Encounter: Admission: RE | Admit: 2010-05-12 | Discharge: 2010-05-12 | Payer: Self-pay | Admitting: Neurological Surgery

## 2010-05-28 ENCOUNTER — Ambulatory Visit: Payer: Self-pay | Admitting: Internal Medicine

## 2010-05-28 DIAGNOSIS — R079 Chest pain, unspecified: Secondary | ICD-10-CM | POA: Insufficient documentation

## 2010-06-01 LAB — CONVERTED CEMR LAB
Basophils Relative: 1.9 % (ref 0.0–3.0)
Creatinine, Ser: 0.7 mg/dL (ref 0.4–1.2)
Creatinine,U: 94.7 mg/dL
Eosinophils Absolute: 0.1 10*3/uL (ref 0.0–0.7)
Eosinophils Relative: 1.5 % (ref 0.0–5.0)
Hemoglobin: 13.8 g/dL (ref 12.0–15.0)
Lymphocytes Relative: 38.5 % (ref 12.0–46.0)
MCHC: 34 g/dL (ref 30.0–36.0)
MCV: 93 fL (ref 78.0–100.0)
Neutro Abs: 3.9 10*3/uL (ref 1.4–7.7)
Potassium: 3.7 meq/L (ref 3.5–5.1)
RBC: 4.36 M/uL (ref 3.87–5.11)
WBC: 8.2 10*3/uL (ref 4.5–10.5)

## 2010-06-02 ENCOUNTER — Ambulatory Visit: Payer: Self-pay | Admitting: Cardiovascular Disease

## 2010-06-09 ENCOUNTER — Telehealth (INDEPENDENT_AMBULATORY_CARE_PROVIDER_SITE_OTHER): Payer: Self-pay | Admitting: *Deleted

## 2010-06-10 ENCOUNTER — Encounter: Payer: Self-pay | Admitting: Cardiology

## 2010-06-10 ENCOUNTER — Ambulatory Visit: Payer: Self-pay

## 2010-06-10 ENCOUNTER — Encounter (HOSPITAL_COMMUNITY): Admission: RE | Admit: 2010-06-10 | Discharge: 2010-08-12 | Payer: Self-pay | Admitting: Cardiovascular Disease

## 2010-06-10 ENCOUNTER — Ambulatory Visit: Payer: Self-pay | Admitting: Cardiology

## 2010-07-14 ENCOUNTER — Telehealth (INDEPENDENT_AMBULATORY_CARE_PROVIDER_SITE_OTHER): Payer: Self-pay | Admitting: *Deleted

## 2010-07-28 ENCOUNTER — Encounter: Payer: Self-pay | Admitting: Internal Medicine

## 2010-08-04 ENCOUNTER — Encounter: Payer: Self-pay | Admitting: Internal Medicine

## 2010-08-04 ENCOUNTER — Ambulatory Visit: Payer: Self-pay | Admitting: Internal Medicine

## 2010-08-04 DIAGNOSIS — R05 Cough: Secondary | ICD-10-CM

## 2010-08-04 DIAGNOSIS — R059 Cough, unspecified: Secondary | ICD-10-CM | POA: Insufficient documentation

## 2010-08-13 ENCOUNTER — Ambulatory Visit: Payer: Self-pay | Admitting: Internal Medicine

## 2010-08-17 ENCOUNTER — Encounter: Payer: Self-pay | Admitting: Internal Medicine

## 2010-08-26 ENCOUNTER — Encounter: Admission: RE | Admit: 2010-08-26 | Discharge: 2010-08-26 | Payer: Self-pay | Admitting: Neurological Surgery

## 2010-11-24 ENCOUNTER — Encounter: Payer: Self-pay | Admitting: Internal Medicine

## 2010-12-10 ENCOUNTER — Ambulatory Visit
Admission: RE | Admit: 2010-12-10 | Discharge: 2010-12-10 | Payer: Self-pay | Source: Home / Self Care | Attending: Cardiovascular Disease | Admitting: Cardiovascular Disease

## 2010-12-10 ENCOUNTER — Encounter: Payer: Self-pay | Admitting: Cardiovascular Disease

## 2010-12-25 ENCOUNTER — Telehealth: Payer: Self-pay | Admitting: Internal Medicine

## 2010-12-26 ENCOUNTER — Encounter: Payer: Self-pay | Admitting: Interventional Radiology

## 2010-12-27 ENCOUNTER — Encounter: Payer: Self-pay | Admitting: Neurological Surgery

## 2010-12-28 ENCOUNTER — Ambulatory Visit
Admission: RE | Admit: 2010-12-28 | Discharge: 2010-12-28 | Payer: Self-pay | Source: Home / Self Care | Attending: Internal Medicine | Admitting: Internal Medicine

## 2010-12-29 ENCOUNTER — Telehealth: Payer: Self-pay | Admitting: Internal Medicine

## 2010-12-29 ENCOUNTER — Encounter (INDEPENDENT_AMBULATORY_CARE_PROVIDER_SITE_OTHER): Payer: Self-pay | Admitting: *Deleted

## 2010-12-30 ENCOUNTER — Ambulatory Visit
Admission: RE | Admit: 2010-12-30 | Discharge: 2010-12-30 | Payer: Self-pay | Source: Home / Self Care | Attending: Internal Medicine | Admitting: Internal Medicine

## 2010-12-30 ENCOUNTER — Encounter (INDEPENDENT_AMBULATORY_CARE_PROVIDER_SITE_OTHER): Payer: Self-pay | Admitting: *Deleted

## 2011-01-05 ENCOUNTER — Ambulatory Visit
Admission: RE | Admit: 2011-01-05 | Discharge: 2011-01-05 | Payer: Self-pay | Source: Home / Self Care | Attending: Internal Medicine | Admitting: Internal Medicine

## 2011-01-05 ENCOUNTER — Encounter: Payer: Self-pay | Admitting: Internal Medicine

## 2011-01-05 NOTE — Procedures (Signed)
Summary: Upper Endoscopy  Patient: [name] Note: All result statuses are Final unless otherwise noted.  Tests: (1) Upper Endoscopy (EGD)   EGD Upper Endoscopy       [facility].     [place], [place]  [address]           ENDOSCOPY PROCEDURE REPORT           PATIENT:  Janet Steele, Janet Steele  MR#:  [id number]     BIRTHDATE:  22-Jan-1929, 82 yrs. old  GENDER:  female           ENDOSCOPIST:  Docia Chuck. Geri Seminole, MD     Referred by:  .Direct Self,           PROCEDURE DATE:  12/10/2009     PROCEDURE:  EGD with balloon dilatation (TTS 18-19-20mm)     ASA CLASS:  Class III     INDICATIONS:  dysphagia, dilation of esophageal stricture           MEDICATIONS:   Fentanyl 75 mcg IV, Versed 8 mg IV, Benadryl 25 mg     IV     TOPICAL ANESTHETIC:  Exactacain Spray           DESCRIPTION OF PROCEDURE:   After the risks benefits and     alternatives of the procedure were thoroughly explained, informed     consent was obtained.  The LB GIF-H180 [id number] endoscope was     introduced through the mouth and advanced to the second portion of     the duodenum, without limitations.  The instrument was slowly     withdrawn as the mucosa was fully examined.     <<PROCEDUREIMAGES>>           A31mm ringlike stricture was found in the distal esophagus.  A     hiatal hernia was found.  Otherwise the examination was normal.     Retroflexed views revealed a hiatal hernia.           THERAPY: SEQUENTIAL TTS BALLOON (18-19-20MM); MILD RESISTANCE AND     TRACE HEME W/ 20MM ONLY. TOLERATED WELL           The scope was then withdrawn from the patient and the procedure     completed.           COMPLICATIONS:  None           ENDOSCOPIC IMPRESSION:     1) Stricture in the distal esophagus - S/P BALLOON DILATION TO     20MM     2) Hiatal hernia     3) Otherwise normal examination           RECOMMENDATIONS:     1) Clear liquids until 1PM, then soft foods rest of day. Resume  prior diet tomorrow.     2) continue current medications           ______________________________     Docia Chuck. Geri Seminole, MD           CC:  Hendricks Limes, MD, The Patient           n.     eSIGNED:   Docia Chuck. Geri Seminole at 12/10/2009 11:04 AM           Janet Steele, [id number]  Note: An exclamation mark (!) indicates a result that was not dispersed into the flowsheet. Document Creation Date: 12/10/2009 11:04 AM  _______________________________________________________________________  (1) Order result status: Final Collection or observation date-time: 12/10/2009 10:54 Requested date-time:  Receipt date-time:  Reported date-time:  Referring Physician:   Ordering Physician: Lavena Bullion [phone]) Specimen Source:  Source: Tawanna Cooler Order Number: [id number] Lab site:

## 2011-01-05 NOTE — Assessment & Plan Note (Signed)
Summary: cough/cbs   Vital Signs:  Patient profile:   77 year old female Weight:      142 pounds BMI:     27.15 Temp:     98.0 degrees F oral Pulse rate:   64 / minute Resp:     18 per minute BP sitting:   112 / 60  (left arm) Cuff size:   large  Vitals Entered By: Georgette Dover CMA (August 04, 2010 1:31 PM) CC: Cough x 4 months (only went away x 1 week)    Primary Care Rubylee Zamarripa:  Unice Cobble, MD  CC:  Cough x 4 months (only went away x 1 week) .  History of Present Illness:  Cough      This is a 77 year old woman who presents with Cough x 4 months.  The patient reports productive cough with thick white sputum , exertional dyspnea with stairs , and malaise, but denies pleuritic chest pain, shortness of breath, wheezing, fever, and hemoptysis.  Associated symtpoms include acid reflux symptoms.  The patient denies the following symptoms: cold/URI symptoms, sore throat, nasal congestion, chronic rhinitis, weight loss, and peripheral edema.  The cough is worse with lying down.  Ineffective prior treatments have included other asthma medication(Singul;air).  Risk factors include history of reflux.  Diagnostic testing to date has included CXR; today's film revealed NAD.Marland Kitchen    Current Medications (verified): 1)  Glimepiride 2 Mg Tabs (Glimepiride) .... Take 1 Am and 1/2 Pm, Appointment Due 2)  Crestor 40 Mg Tabs (Rosuvastatin Calcium) .... Take One Tablet By Mouth Daily. 3)  Freestyle Lite Test   Strp (Glucose Blood) .... Test 1-2 Times A Day 4)  Vitamin D 1000 Unit  Tabs (Cholecalciferol) .Marland Kitchen.. 1 By Mouth Once Daily 5)  Cymbalta 60 Mg  Cpep (Duloxetine Hcl) .... As Needed 6)  Metformin Hcl 500 Mg Tabs (Metformin Hcl) .Marland Kitchen.. 1 By Mouth Once Daily 7)  Nitroglycerin 0.4 Mg Subl (Nitroglycerin) .... One Tablet Under Tongue Every 5 Minutes As Needed For Chest Pain---May Repeat Times Three 8)  Levothyroxine Sodium 150 Mcg Tabs (Levothyroxine Sodium) .... Take 1/2 Tablet Daily 9)  Aspirin Ec 325  Mg Tbec (Aspirin) .... Take One Tablet By Mouth Daily 10)  Trazodone Hcl 50 Mg Tabs (Trazodone Hcl) .Marland Kitchen.. 1 By Mouth At Bedtime 11)  Reclast 5 Mg/145ml Soln (Zoledronic Acid) .... Once A Year 12)  Prochlorperazine Maleate 10 Mg Tabs (Prochlorperazine Maleate) .... Take 1 Tablet By Mouth Every 4-6 Hours As Needed Nausea  Allergies: 1)  ! Codeine 2)  ! Percodan 3)  ! Pcn 4)  ! Sulfa 5)  ! Morphine 6)  ! Talwin 7)  ! Demerol 8)  ! Ultram 9)  ! * Avelox 10)  ! Reglan 11)  ! Vicodin 12)  ! * Actos 13)  ! Lortab 14)  ! Metformin Hcl (Metformin Hcl) 15)  ! Metoprolol Succinate (Metoprolol Succinate)  Review of Systems General:  Denies chills and sweats. ENT:  No frontal headache , facial pain or purulence. GI:  Complains of indigestion; denies bloody stools and dark tarry stools.  Physical Exam  General:  well-nourished,in no acute distress; alert,appropriate and cooperative throughout examination Ears:  External ear exam shows no significant lesions or deformities.  Otoscopic examination reveals clear canals, tympanic membranes are intact bilaterally without bulging, retraction, inflammation or discharge. Hearing is grossly normal bilaterally. Nose:  External nasal examination shows no deformity or inflammation. Nasal mucosa are pink and moist without lesions or exudates.  Mouth:  Oral mucosa and oropharynx without lesions or exudates.  Teeth in  fair repair ; upper plate & lower partial Lungs:  Normal respiratory effort, chest expands symmetrically. Lungs are clear to auscultation, no crackles or wheezes. Heart:  normal rate, regular rhythm, no gallop, no rub, no JVD, no HJR, and grade 1/2 /6 systolic murmur.   Abdomen:  Bowel sounds positive,abdomen soft  slightly tender mid abdomen  without masses, organomegaly or hernias noted. Pulses:  R and L carotid,radial  and posterior tibial pulses are full and equal bilaterally. Decreased DPP  Extremities:  No cyanosis, edema. Slight  clubbing Neurologic:  alert & oriented X3.   Skin:  Dependent plethora RLE  Cervical Nodes:  No lymphadenopathy noted Axillary Nodes:  No palpable lymphadenopathy Psych:  memory intact for recent and remote, normally interactive, and good eye contact.     Impression & Recommendations:  Problem # 1:  COUGH (Z2878448.2)  probably due to #2  Orders: Misc. Referral (Misc. Ref)  Problem # 2:  GERD (ICD-530.81)  The following medications were removed from the medication list:    Nexium 40 Mg Cpdr (Esomeprazole magnesium) .Marland Kitchen... 1-2- times a day Her updated medication list for this problem includes:    Nexium 40 Mg Cpdr (Esomeprazole magnesium) .Marland Kitchen... 1 two times a day pre meals  Complete Medication List: 1)  Glimepiride 2 Mg Tabs (Glimepiride) .... Take 1 am and 1/2 pm, appointment due 2)  Crestor 40 Mg Tabs (Rosuvastatin calcium) .... Take one tablet by mouth daily. 3)  Freestyle Lite Test Strp (Glucose blood) .... Test 1-2 times a day 4)  Vitamin D 1000 Unit Tabs (Cholecalciferol) .Marland Kitchen.. 1 by mouth once daily 5)  Cymbalta 60 Mg Cpep (Duloxetine hcl) .... As needed 6)  Metformin Hcl 500 Mg Tabs (Metformin hcl) .Marland Kitchen.. 1 by mouth once daily 7)  Nitroglycerin 0.4 Mg Subl (Nitroglycerin) .... One tablet under tongue every 5 minutes as needed for chest pain---may repeat times three 8)  Levothyroxine Sodium 150 Mcg Tabs (Levothyroxine sodium) .... Take 1/2 tablet daily 9)  Aspirin Ec 325 Mg Tbec (Aspirin) .... Take one tablet by mouth daily 10)  Trazodone Hcl 50 Mg Tabs (Trazodone hcl) .Marland Kitchen.. 1 by mouth at bedtime 11)  Reclast 5 Mg/140ml Soln (Zoledronic acid) .... Once a year 44)  Prochlorperazine Maleate 10 Mg Tabs (Prochlorperazine maleate) .... Take 1 tablet by mouth every 4-6 hours as needed nausea 13)  Nexium 40 Mg Cpdr (Esomeprazole magnesium) .Marland Kitchen.. 1 two times a day pre meals  Patient Instructions: 1)  Hold Ranitidine ; take Nexium 30 min pre b'fast & eve meal. 2)  Avoid foods high in  acid (tomatoes, citrus juices, spicy foods). Avoid eating within two hours of lying down or before exercising. Do not over eat; try smaller more frequent meals. Elevate head of bed twelve inches when sleeping. Prescriptions: NEXIUM 40 MG CPDR (ESOMEPRAZOLE MAGNESIUM) 1 two times a day pre meals  #30 x 0   Entered and Authorized by:   Unice Cobble MD   Signed by:   Unice Cobble MD on 08/04/2010   Method used:   Samples Given   RxID:   [id number]

## 2011-01-05 NOTE — Progress Notes (Signed)
Summary: Controlled med refill request  Phone Note Refill Request Message from:  Patient  Refills Requested: Medication #1:  ZOLPIDEM TARTRATE 5 MG TABS 1 at bedtime as needed.   Last Refilled: 12/11/2009 Dr.Hopper patient states she is only taking every 3rd night and she really needs more of this med to help her with sleep. Would you like to increase the dispense number?   [facility]   Method Requested: Electronic Initial call taken by: Georgette Dover,  December 30, 2009 11:29 AM  Follow-up for Phone Call        #10 , 1 every 3rd night as needed only Follow-up by: Unice Cobble MD,  December 30, 2009 11:38 AM    Prescriptions: ZOLPIDEM TARTRATE 5 MG TABS (ZOLPIDEM TARTRATE) 1 at bedtime as needed  #10 x 0   Entered by:   Georgette Dover   Authorized by:   Unice Cobble MD   Signed by:   Georgette Dover on 12/30/2009   Method used:   Printed then faxed to ...       [facility] [address] [phone]* (retail)       [address]       [place], [place]  [address]       Ph: [phone] or [phone]       Fax: [phone]   RxID:   [id number]

## 2011-01-05 NOTE — Assessment & Plan Note (Signed)
Summary: coughing x3 months//lch   Vital Signs:  Patient profile:   77 year old female Weight:      136 pounds Temp:     98.4 degrees F oral Pulse rate:   60 / minute Resp:     17 per minute BP sitting:   106 / 62  (left arm) Cuff size:   large  Vitals Entered By: Georgette Dover (Apr 06, 2010 2:49 PM) CC: Cough x 3 months (vomitting at times) Comments REVIEWED MED LIST, PATIENT AGREED DOSE AND INSTRUCTION CORRECT    Primary Care Provider:  Unice Cobble, MD  CC:  Cough x 3 months (vomitting at times).  History of Present Illness:  Cough      This is a 77 year old woman who presents with Cough X 4 months . The patient reports productive cough with clear sputum, shortness of breath, exertional dyspnea, and malaise, but denies pleuritic chest pain, wheezing, fever, and hemoptysis.  Associated symtpoms include acid reflux symptoms.  The patient denies the following symptoms: cold/URI symptoms, sore throat, nasal congestion, chronic rhinitis, weight loss, and peripheral edema.  The cough is worse with lying down.  Ineffective prior treatments have included PPI once daily(previously on Nexium & Omeprazole).  Risk factors include history of reflux;S/P esophageal dilation 2010 by Dr Henrene Pastor.Now on OTC Zantac  daily. Neti pot, Nasonex  & Mucinex DM help. Not on ACE-I.  Allergies: 1)  ! Codeine 2)  ! Percodan 3)  ! Pcn 4)  ! Sulfa 5)  ! Morphine 6)  ! Talwin 7)  ! Demerol 8)  ! Ultram 9)  ! * Avelox 10)  ! Reglan 11)  ! Vicodin 12)  ! * Actos 13)  ! Lortab 14)  ! Metformin Hcl (Metformin Hcl) 15)  ! Metoprolol Succinate (Metoprolol Succinate)  Review of Systems ENT:  Complains of hoarseness; denies difficulty swallowing. GI:  Denies abdominal pain, bloody stools, and dark tarry stools. Allergy:  Complains of sneezing; denies itching eyes.  Physical Exam  General:  in no acute distress; alert,appropriate and cooperative throughout examination Ears:  External ear exam shows no  significant lesions or deformities.  Otoscopic examination reveals clear canals, tympanic membranes are intact bilaterally without bulging, retraction, inflammation or discharge. Hearing is grossly normal bilaterally. Nose:  External nasal examination shows no deformity or inflammation. Nasal mucosa are pink and moist without lesions or exudates. Mouth:  Oral mucosa and oropharynx without lesions or exudates.  Upper plate & lower partial Neck:  No deformities, masses, or tenderness noted. Lungs:  Normal respiratory effort, chest expands symmetrically. Lungs are clear to auscultation, no crackles or wheezes. Heart:  regular rhythm, bradycardia, and grade 1 /6 systolic murmur.   Skin:  Erythema of hands Cervical Nodes:  No lymphadenopathy noted Axillary Nodes:  No palpable lymphadenopathy Psych:  memory intact for recent and remote, normally interactive, and good eye contact.     Impression & Recommendations:  Problem # 1:  COUGH (L2106332.2) Very blikely related to chronic ERD Orders: T-2 View CXR ([id number])  Problem # 2:  RHINITIS ([id number])  Sneezing post cough  Her updated medication list for this problem includes:    Cetirizine Hcl 10 Mg Tabs (Cetirizine hcl) .Marland Kitchen... 1 at bedtime  Problem # 3:  GERD (ICD-530.81) S/P esophageal dilation 2010, Dr Henrene Pastor The following medications were removed from the medication list:    Nexium 40 Mg Cpdr (Esomeprazole magnesium) .Marland Kitchen... As needed    Prilosec Otc 20 Mg Tbec (Omeprazole magnesium) .Marland KitchenMarland KitchenMarland KitchenMarland Kitchen  As needed Her updated medication list for this problem includes:    Ranitidine Hcl 150 Mg Tabs (Ranitidine hcl) .Marland Kitchen... 1 two times a day pre meals  Complete Medication List: 1)  Glimepiride 2 Mg Tabs (Glimepiride) .... Take 1 am and 1/2 pm 2)  Crestor 40 Mg Tabs (Rosuvastatin calcium) .... Take one tablet by mouth daily. 3)  Freestyle Lite Test Strp (Glucose blood) .... Test 1-2 times a day 4)  Vitamin D 1000 Unit Tabs (Cholecalciferol) .Marland Kitchen.. 1 by mouth  once daily 5)  Cymbalta 60 Mg Cpep (Duloxetine hcl) .... As needed 6)  Metformin Hcl 500 Mg Tabs (Metformin hcl) .Marland Kitchen.. 1 by mouth once daily 7)  Nitroglycerin 0.4 Mg Subl (Nitroglycerin) .... One tablet under tongue every 5 minutes as needed for chest pain---may repeat times three 8)  Levothyroxine Sodium 150 Mcg Tabs (Levothyroxine sodium) .... Take 1/2 tablet daily 9)  Aspirin Ec 325 Mg Tbec (Aspirin) .... Take one tablet by mouth daily 10)  Zolpidem Tartrate 5 Mg Tabs (Zolpidem tartrate) .Marland Kitchen.. 1 at bedtime as needed 11)  Delsym 30 Mg/17ml Lqcr (Dextromethorphan polistirex) .... As needed cough 12)  Mucinex D 60-600 Mg Xr12h-tab (Pseudoephedrine-guaifenesin) .... As needed for cough 13)  Singulair 10 Mg Tabs (Montelukast sodium) .Marland Kitchen.. 1 each am for cough 14)  Cetirizine Hcl 10 Mg Tabs (Cetirizine hcl) .Marland Kitchen.. 1 at bedtime 15)  Ranitidine Hcl 150 Mg Tabs (Ranitidine hcl) .Marland Kitchen.. 1 two times a day pre meals  Patient Instructions: 1)  Avoid foods high in acid (tomatoes, citrus juices, spicy foods). Avoid eating within two hours of lying down or before exercising. Do not over eat; try smaller more frequent meals. Elevate head of bed twelve inches when sleeping.Call if no better with Ranitidine 150 mg two times a day pre meals. Prescriptions: RANITIDINE HCL 150 MG TABS (RANITIDINE HCL) 1 two times a day pre meals  #180 x 1   Entered and Authorized by:   Unice Cobble MD   Signed by:   Unice Cobble MD on 04/06/2010   Method used:   Print then Give to Patient   RxID:   [id number] SINGULAIR 10 MG TABS (MONTELUKAST SODIUM) 1 each am for cough  #30 x 5   Entered and Authorized by:   Unice Cobble MD   Signed by:   Unice Cobble MD on 04/06/2010   Method used:   Print then Give to Patient   RxID:   [id number]

## 2011-01-05 NOTE — Letter (Signed)
Summary: [facility]   Imported By: Edmonia James 08/18/2010 08:54:16  _____________________________________________________________________  External Attachment:    Type:   Image     Comment:   External Document

## 2011-01-05 NOTE — Progress Notes (Signed)
Summary: Referral Request to [facility]  Phone Note Call from Patient Call back at Home Phone [phone]   Caller: Patient Summary of Call: Patient would like a referral to [facility], patient said she spoke with Dr.Madelein Mahadeo sometime last week and should would like to persue treatment for her head.  Patient said when we call back to let her know about appointment do **NOT** leave where we are calling from on her VM, just leave the number for her to call back   Dr.Sadeel Fiddler please advise  Georgette Dover  December 17, 2009 3:03 PM   Follow-up for Phone Call        Patient already established w/Dr. Erling Cruz - [facility].  I faxed completed referral form, referral, and notes.  Now awaiting an appt. Follow-up by: Phylliss Bob [facility],  December 18, 2009 9:20 AM  Additional Follow-up for Phone Call Additional follow up Details #1::        PT APPT IS 01-16-2010 @ 9:30AM W/DR Jeanmarie Plant & MAILED LETTER.  WHEN I LM, PER PT REQUEST, I DID NOT STATE WHERE I WAS CALLING FROM OR WHAT I WAS CALLING ABOUT, JUST LEFT MY NAME & PHONE #  Phylliss Bob [facility]  December 23, 2009 10:08 AM   New Problems: ABNORMAL INVOLUNTARY MOVEMENTS (ICD-781.0)   New Problems: ABNORMAL INVOLUNTARY MOVEMENTS (ICD-781.0)

## 2011-01-05 NOTE — Miscellaneous (Signed)
Summary: Orders Update  Clinical Lists Changes  Problems: Added new problem of COUGH (ICD-786.2) Orders: Added new Test order of T-2 View CXR ([id number]) - Signed

## 2011-01-05 NOTE — Letter (Signed)
Summary: [facility]   Imported By: Edmonia James 02/24/2010 13:09:00  _____________________________________________________________________  External Attachment:    Type:   Image     Comment:   External Document

## 2011-01-05 NOTE — Miscellaneous (Signed)
Summary: Orders Update  Clinical Lists Changes  Orders: Added new Referral order of Neurology Referral (Neuro) - Signed

## 2011-01-05 NOTE — Assessment & Plan Note (Signed)
Summary: Cardiology Nuclear Study  Nuclear Med Background Indications for Stress Test: Evaluation for Ischemia, Graft Patency   History: Asthma, CABG, COPD, Echo, Heart Catheterization, Myocardial Infarction, Myocardial Perfusion Study  History Comments: '82 CABG x4; re-do '02 x2 '99 Echo: EF=55%, NL '08 MPS: EF=68%, piror infarct in Lat/post.Lat. wall w/ mild-mod P.I.  Symptoms: Chest Pain, Chest Pain with Exertion, Chest Pressure, Chest Pressure with Exertion, DOE, Fatigue, Palpitations, SOB    Nuclear Pre-Procedure Cardiac Risk Factors: Family History - CAD, History of Smoking, Hypertension, Lipids, NIDDM, PVD Caffeine/Decaff Intake: None NPO After: 7:00 PM Lungs: clear IV 0.9% NS with Angio Cath: 22g     IV Site: (R) AC IV Started by: Irven [place] RN Chest Size (in) 38     Cup Size C     Height (in): 60.75 Weight (lb): 134 BMI: 25.62  Nuclear Med Study 1 or 2 day study:  1 day     Stress Test Type:  Carlton Adam Reading MD:  Loralie Champagne, MD     Referring MD:  M.Cooper Resting Radionuclide:  Technetium 60m Tetrofosmin     Resting Radionuclide Dose:  11.0 mCi  Stress Radionuclide:  Technetium 17m Tetrofosmin     Stress Radionuclide Dose:  33.0 mCi   Stress Protocol   Lexiscan: 0.4 mg   Stress Test Technologist:  Perrin Maltese EMT-P     Nuclear Technologist:  Charlton Amor CNMT  Rest Procedure  Myocardial perfusion imaging was performed at rest 45 minutes following the intravenous administration of Myoview Technetium 39m Tetrofosmin.  Stress Procedure  The patient received IV Lexiscan 0.4 mg over 15-seconds.  Myoview injected at 30-seconds.  There were no significant changes and rare pvcs with infusion.  Quantitative spect images were obtained after a 45 minute delay.  QPS Raw Data Images:  Normal; no motion artifact; normal heart/lung ratio. Stress Images:  Mild to moderate perfusion defect involving the basal to mid inferolateral and anterolateral walls Rest  Images:  Same as stress but less pronounced perfusion defect Subtraction (SDS):  Mild to moderate partially reversible basal to mid inferolateral and anterolateral perfusion defect.   Transient Ischemic Dilatation:  1.02  (Normal <1.22)  Lung/Heart Ratio:  .33  (Normal <0.45)  Quantitative Gated Spect Images QGS EDV:  76 ml QGS ESV:  28 ml QGS EF:  63 % QGS cine images:  Normal wall motion   Overall Impression  Exercise Capacity: Lexiscan study BP Response: Normal blood pressure response. Clinical Symptoms: Headache ECG Impression: No significant ST segment change suggestive of ischemia. Overall Impression: Mild to moderate partially reversible basal to mid inferolateral and anterolateral perfusion defect.  Overall Impression Comments: This is suggestive of mixed ischemia and infarction in the basal to mid anterolateral and inferolateral walls.  EF is preserved.  I compared the old study from 2006 and there does not appear to be significant change from that time.   Appended Document: Cardiology Nuclear Study Reviewed - unchanged from 2006 study. continue with medical therapy unless progressive anginal symptoms.  Appended Document: Cardiology Nuclear Study Pt aware of results by phone.  Dr Burt Knack had also recommended having the pt try Isosorbide MN 30mg  daily.  I spoke with the pt about this medication and she does not want to try this medication because of possible HA.  The pt said she would think about it and if she does want to start Isosorbide MN then she will call back.  I told the pt to contact our office if she has any other  problems with chest pain. The pt states she has not had any episodes since last week.

## 2011-01-05 NOTE — Progress Notes (Signed)
Summary: nausea med  Phone Note Outgoing Call   Summary of Call: Gave husband a message to have pt. call me as she has requested a refill on her Compazine.He says she has been coughing a lot and is on Prednisone so he isn't sure why she requested Compazine. Initial call taken by: Abel Presto RN,  July 14, 2010 9:24 AM  Follow-up for Phone Call        Pt. says she was treated for sinus infection but has completed those meds. Has no acute symptoms but still gets intermittent nausea and has an episode now and  she is out of her med. which helps when nausea occurs. rx. re-newed. Follow-up by: Abel Presto RN,  July 14, 2010 12:05 PM

## 2011-01-05 NOTE — Progress Notes (Signed)
Summary: --Medication Concerns  Phone Note Call from Patient Call back at Home Phone [phone]   Caller: Patient Summary of Call: Message left on VM: Patient states that she would really appreciate it if Dr.Hopper would give her more than #10 for Ambien Refill. Patient said its only 5mg .  [facility]  Dr.Hopper Please advise.   Chrae Malloy  January 12, 2010 11:09 AM   Follow-up for Phone Call        Please see Dr Beacher May for sleep evaluation . I am concerned about risks due to multiple meds & health issues with this med  daily . I discussed the options with her husband 02/04 Follow-up by: Unice Cobble MD,  January 12, 2010 4:06 PM  Additional Follow-up for Phone Call Additional follow up Details #1::        tried to call pt no answer or machine will try again later.................Marland KitchenFelecia Deloach CMA  January 12, 2010 4:20 PM     Additional Follow-up for Phone Call Additional follow up Details #2::    pt aware...............................Marland KitchenFelecia Deloach CMA  January 12, 2010 5:37 PM

## 2011-01-05 NOTE — Letter (Signed)
Summary: Patient Declined Study/[facility]  Patient Declined Study/[facility] @ [place]   Imported By: Edmonia James 02/13/2010 08:50:14  _____________________________________________________________________  External Attachment:    Type:   Image     Comment:   External Document

## 2011-01-05 NOTE — Assessment & Plan Note (Signed)
Summary: med refill/cbs   Vital Signs:  Patient profile:   77 year old female Height:      60.75 inches Weight:      136.2 pounds Temp:     97.9 degrees F oral Pulse rate:   60 / minute Resp:     16 per minute BP sitting:   116 / 60  (left arm) Cuff size:   large  Vitals Entered By: Georgette Dover (May 28, 2010 2:30 PM) CC: Yearly follow-up , heart followed up on by heart Dr., Type 2 diabetes mellitus follow-up Comments REVIEWED MED LIST, PATIENT AGREED DOSE AND INSTRUCTION CORRECT    Primary Care Provider:  Unice Cobble, MD  CC:  Yearly follow-up , heart followed up on by heart Dr., and Type 2 diabetes mellitus follow-up.  History of Present Illness: Type 2 Diabetes Mellitus Follow-Up      This is a 77 year old woman who presents for Type 2 diabetes mellitus follow-up.  The patient reports  intermittent numbness of extremities RLE > LLE esp @ night, but denies polyuria, polydipsia, blurred vision, self managed hypoglycemia, weight loss, and weight gain.  Other symptoms include neuropathic pain (on Cymbalta with benefit). She had chest pain last week unresponsive to NTG.  The patient denies the following symptoms: vomiting, orthostatic symptoms, poor wound healing, intermittent claudication, vision loss, and foot ulcer.  Since the last visit the patient reports good dietary compliance with low carb diet, compliance with medications, not exercising regularly, and monitoring blood glucose.  The patient has been measuring capillary blood glucose before breakfast 91-126  and 2 hrs  after dinner < 135.  Since the last visit, the patient reports having had eye care by an ophthalmologist, cataract surgery,  but no foot care.  Complications from diabetes include ASCVD, PVD, and peripheral neuropathy.       The patient also complains of Chest pain last week.  The patient reports resting chest pain after lying down. Also she  describes intermittent  exertional chest pain  , SOB , and palpitations  after exercise such as walking & then climbing stairs quickly. She  denies nausea, vomiting, diaphoresis, dizziness, syncope, and indigestion.  The resting  pain is described as intermittent, pressure-like, and dull.  The pain is located in the substernal area.  Episodes of chest pain last 2-5 minutes.  The pain is brought on or made worse by lying down.  The pain is relieved or improved with eructation; "it was like a balloon deflating". She takes Zantac two times a day irregularly. She has appt with Dr Burt Knack next week.  Allergies: 1)  ! Codeine 2)  ! Percodan 3)  ! Pcn 4)  ! Sulfa 5)  ! Morphine 6)  ! Talwin 7)  ! Demerol 8)  ! Ultram 9)  ! * Avelox 10)  ! Reglan 11)  ! Vicodin 12)  ! * Actos 13)  ! Lortab 14)  ! Metformin Hcl (Metformin Hcl) 15)  ! Metoprolol Succinate (Metoprolol Succinate)  Past History:  Past Medical History: CAD, ARTERY BYPASS GRAFT (ICD-414.04)x 2 MYOCARDIAL INFARCTION, HX OF (ICD-412) HYPERTENSION, ESSENTIAL NOS (ICD-401.9) HYPERLIPIDEMIA (ICD-272.2) PVD (ICD-443.9) CAROTID BRUIT (ICD-785.9) GERD (ICD-530.81) ANXIETY (ICD-300.00) DIABETES MELLITUS, TYPE II, CONTROLLED, MILD (ICD-250.00) UNSPECIFIED MYALGIA AND MYOSITIS (ICD-729.1) DYSPHAGIA (ICD-787.29) EROSIVE ESOPHAGITIS (ICD-530.19), PMH of HEMORRHOIDS (ICD-455.6) CONSTIPATION, CHRONIC (ICD-564.09) DIVERTICULOSIS, COLON (ICD-562.10) HIATAL HERNIA (ICD-553.3) DEGENERATIVE JOINT DISEASE (ICD-715.90) ESOPHAGEAL STRICTURE (ICD-530.3), PMH of HEARING LOSS (ICD-389.9) HYPOTHYROIDISM (ICD-244.9)  Review of Systems ENT:  Complains  of difficulty swallowing; denies hoarseness; PMH of repeated dysphagia monitored by Dr. Henrene Pastor. GI:  Denies abdominal pain, bloody stools, and dark tarry stools.  Physical Exam  General:  in no acute distress; alert,appropriate and cooperative throughout examination Neck:  No deformities, masses, or tenderness noted. Lungs:  Normal respiratory effort, chest expands  symmetrically. Lungs are clear to auscultation, no crackles or wheezes. Heart:  Normal rate and regular rhythm. S1 and S2 normal without gallop, murmur, click, rub or other extra sounds.S4 Abdomen:  Bowel sounds positive,abdomen soft but minimally tender in epigastriumwithout masses, organomegaly or hernias noted. Pulses:  R and L carotid,radial  pulses are full and equal bilaterally. Decreased pedal pulses , esp DPP Extremities:  No  cyanosis, edema. Slight clubbing. Good nail health. Neurologic:  alert & oriented X3 and DTRs symmetrical and normal.   Skin:  Faint plethora R foot ; feet cool w/o ischemic change Psych:  memory intact for recent and remote, normally interactive, and good eye contact.     Impression & Recommendations:  Problem # 1:  DIABETES MELLITUS, TYPE II, CONTROLLED, MILD (ICD-250.00)  Her updated medication list for this problem includes:    Glimepiride 2 Mg Tabs (Glimepiride) .Marland Kitchen... Take 1 am and 1/2 pm, appointment due    Metformin Hcl 500 Mg Tabs (Metformin hcl) .Marland Kitchen... 1 by mouth once daily    Aspirin Ec 325 Mg Tbec (Aspirin) .Marland Kitchen... Take one tablet by mouth daily  Orders: Venipuncture HR:[id number]) TLB-Creatinine, Blood (82565-CREA) TLB-Potassium (K+) (84132-K) TLB-BUN (Urea Nitrogen) (84520-BUN) TLB-A1C / Hgb A1C (Glycohemoglobin) (83036-A1C) TLB-Microalbumin/Creat Ratio, Urine (82043-MALB)  Problem # 2:  CHEST PAIN (ICD-786.50) Both ERD & CAD components  suggested (see History)  Problem # 3:  HYPOTHYROIDISM (ICD-244.9)  Her updated medication list for this problem includes:    Levothyroxine Sodium 150 Mcg Tabs (Levothyroxine sodium) .Marland Kitchen... Take 1/2 tablet daily  Orders: TLB-TSH (Thyroid Stimulating Hormone) (84443-TSH)  Problem # 4:  HYPERTENSION, ESSENTIAL NOS (ICD-401.9) controlled Orders: TLB-Creatinine, Blood (82565-CREA) TLB-Potassium (K+) (84132-K) TLB-BUN (Urea Nitrogen) (84520-BUN)  Problem # 5:  DYSPHAGIA [id number]) as per Dr  Henrene Pastor Orders: TLB-CBC Platelet - w/Differential (85025-CBCD)  Complete Medication List: 1)  Glimepiride 2 Mg Tabs (Glimepiride) .... Take 1 am and 1/2 pm, appointment due 2)  Crestor 40 Mg Tabs (Rosuvastatin calcium) .... Take one tablet by mouth daily. 3)  Freestyle Lite Test Strp (Glucose blood) .... Test 1-2 times a day 4)  Vitamin D 1000 Unit Tabs (Cholecalciferol) .Marland Kitchen.. 1 by mouth once daily 5)  Cymbalta 60 Mg Cpep (Duloxetine hcl) .... As needed 6)  Metformin Hcl 500 Mg Tabs (Metformin hcl) .Marland Kitchen.. 1 by mouth once daily 7)  Nitroglycerin 0.4 Mg Subl (Nitroglycerin) .... One tablet under tongue every 5 minutes as needed for chest pain---may repeat times three 8)  Levothyroxine Sodium 150 Mcg Tabs (Levothyroxine sodium) .... Take 1/2 tablet daily 9)  Aspirin Ec 325 Mg Tbec (Aspirin) .... Take one tablet by mouth daily 10)  Ranitidine Hcl 150 Mg Tabs (Ranitidine hcl) .Marland Kitchen.. 1 two times a day pre meals 11)  Trazodone Hcl 50 Mg Tabs (Trazodone hcl) .Marland Kitchen.. 1 by mouth at bedtime  Other Orders: Pneumococcal Vaccine [id number]) Admin 1st Vaccine [phone])  Patient Instructions: 1)  Avoid foods high in acid (tomatoes, citrus juices, spicy foods). Avoid eating within two hours of lying down or before exercising. Do not over eat; try smaller more frequent meals. Elevate head of bed twelve inches when sleeping.   Immunizations Administered:  Pneumonia Vaccine:  Vaccine Type: Pneumovax (Medicare)    Site: right deltoid    Mfr: Merck    Dose: 0.5 ml    Route: IM    Given by: Georgette Dover    Exp. Date: 12/23/2011    Lot #: [id number]

## 2011-01-05 NOTE — Assessment & Plan Note (Signed)
Summary: chest pain   Visit Type:  Follow-up Primary Provider:  Unice Cobble, MD  CC:  Chest pains.  History of Present Illness: This is a 77 year old woman with coronary artery disease status post CABG, presenting for followup evaluation. She underwent initial coronary bypass surgery in 1982, and had a redo surgery in 2002. She continues to complain of exertional dyspnea, and relates this to deconditioning. She also admits to nocturnal chest pain, but denies exertional pain or pressure. She has not required nitroglycerin. Denies edema, palps, or other complaints.  Current Medications (verified): 1)  Glimepiride 2 Mg Tabs (Glimepiride) .... Take 1 Am and 1/2 Pm, Appointment Due 2)  Crestor 40 Mg Tabs (Rosuvastatin Calcium) .... Take One Tablet By Mouth Daily. 3)  Freestyle Lite Test   Strp (Glucose Blood) .... Test 1-2 Times A Day 4)  Vitamin D 1000 Unit  Tabs (Cholecalciferol) .Marland Kitchen.. 1 By Mouth Once Daily 5)  Cymbalta 60 Mg  Cpep (Duloxetine Hcl) .... As Needed 6)  Metformin Hcl 500 Mg Tabs (Metformin Hcl) .Marland Kitchen.. 1 By Mouth Once Daily 7)  Nitroglycerin 0.4 Mg Subl (Nitroglycerin) .... One Tablet Under Tongue Every 5 Minutes As Needed For Chest Pain---May Repeat Times Three 8)  Levothyroxine Sodium 150 Mcg Tabs (Levothyroxine Sodium) .... Take 1/2 Tablet Daily 9)  Aspirin Ec 325 Mg Tbec (Aspirin) .... Take One Tablet By Mouth Daily 10)  Nexium 40 Mg Cpdr (Esomeprazole Magnesium) .Marland Kitchen.. 1-2- Times A Day 11)  Trazodone Hcl 50 Mg Tabs (Trazodone Hcl) .Marland Kitchen.. 1 By Mouth At Bedtime 12)  Reclast 5 Mg/148ml Soln (Zoledronic Acid) .... Once A Year  Allergies: 1)  ! Codeine 2)  ! Percodan 3)  ! Pcn 4)  ! Sulfa 5)  ! Morphine 6)  ! Talwin 7)  ! Demerol 8)  ! Ultram 9)  ! * Avelox 10)  ! Reglan 11)  ! Vicodin 12)  ! * Actos 13)  ! Lortab 14)  ! Metformin Hcl (Metformin Hcl) 15)  ! Metoprolol Succinate (Metoprolol Succinate)  Past History:  Past medical history reviewed for relevance to  current acute and chronic problems.  Past Medical History: Reviewed history from 05/28/2010 and no changes required. CAD, ARTERY BYPASS GRAFT (ICD-414.04)x 2 MYOCARDIAL INFARCTION, HX OF (ICD-412) HYPERTENSION, ESSENTIAL NOS (ICD-401.9) HYPERLIPIDEMIA (ICD-272.2) PVD (ICD-443.9) CAROTID BRUIT (ICD-785.9) GERD (ICD-530.81) ANXIETY (ICD-300.00) DIABETES MELLITUS, TYPE II, CONTROLLED, MILD (ICD-250.00) UNSPECIFIED MYALGIA AND MYOSITIS (ICD-729.1) DYSPHAGIA (ICD-787.29) EROSIVE ESOPHAGITIS (ICD-530.19), PMH of HEMORRHOIDS (ICD-455.6) CONSTIPATION, CHRONIC (ICD-564.09) DIVERTICULOSIS, COLON (ICD-562.10) HIATAL HERNIA (ICD-553.3) DEGENERATIVE JOINT DISEASE (ICD-715.90) ESOPHAGEAL STRICTURE (ICD-530.3), PMH of HEARING LOSS (ICD-389.9) HYPOTHYROIDISM (ICD-244.9)  Review of Systems       Negative except as per HPI   Vital Signs:  Patient profile:   77 year old female Height:      60.75 inches Weight:      135 pounds BMI:     25.81 Pulse rate:   61 / minute Pulse rhythm:   regular Resp:     18 per minute BP sitting:   144 / 90  (left arm) Cuff size:   large  Vitals Entered By: Sidney Ace (June 02, 2010 4:27 PM)  Physical Exam  General:  Pt is alert and oriented, elderly woman, in no acute distress. HEENT: normal Neck: normal carotid upstrokes without bruits, JVP normal Lungs: CTA CV: RRR without murmur or gallop Abd: soft, NT, positive BS, no bruit, no organomegaly Ext: no clubbing, cyanosis, or edema. peripheral pulses 2+ and equal Skin: warm and  dry without rash    EKG  Procedure date:  06/02/2010  Findings:      NSR 61 bpm, nonspecific T wave abnormality  Impression & Recommendations:  Problem # 1:  CAD, ARTERY BYPASS GRAFT (ICD-414.04)  Pt wiht nocturnal chest pain, uncertain etiology. She has extensive CAD and 2 prior CABG surgeries. Recommend Myoview stress scan to rule-out significant ischemic burden. Otherwise continue current medical Rx with  high-dose crestor and ASA. Consider adding a long-acting nitrate. HR is borderline for starting a beta-blocker.  Problem # 2:  HYPERTENSION, ESSENTIAL NOS (ICD-401.9) stable. Her updated medication list for this problem includes:    Aspirin Ec 325 Mg Tbec (Aspirin) .Marland Kitchen... Take one tablet by mouth daily  BP today: 144/90 Prior BP: 116/60 (05/28/2010)  Prior 10 Yr Risk Heart Disease: N/A (09/07/2007)  Labs Reviewed: K+: 3.7 (05/28/2010) Creat: : 0.7 (05/28/2010)   Chol: 165 (02/23/2010)   HDL: 51.70 (02/23/2010)   LDL: DEL (07/17/2008)   TG: 206.0 (02/23/2010)  Problem # 3:  HYPERLIPIDEMIA (ICD-272.2) Lipids at goal on current therapy.  Her updated medication list for this problem includes:    Crestor 40 Mg Tabs (Rosuvastatin calcium) .Marland Kitchen... Take one tablet by mouth daily.  CHOL: 165 (02/23/2010)   LDL: DEL (07/17/2008)   HDL: 51.70 (02/23/2010)   TG: 206.0 (02/23/2010) CHOL (goal): 200 (09/07/2007)   LDL (goal): 70 (07/25/2008)   HDL (goal): 40 (09/07/2007)   TG (goal): 150 (09/07/2007)  Other Orders: EKG w/ Interpretation (93000) Nuclear Stress Test (Nuc Stress Test)  Patient Instructions: 1)  Your physician recommends that you continue on your current medications as directed. Please refer to the Current Medication list given to you today. 2)  Your physician wants you to follow-up in:  6 MONTHS. You will receive a reminder letter in the mail two months in advance. If you don't receive a letter, please call our office to schedule the follow-up appointment. 3)  Your physician has requested that you have a Lexiscan myoview.  For further information please visit [email or web address].  Please follow instruction sheet, as given.

## 2011-01-05 NOTE — Progress Notes (Signed)
Summary: Nuclear Pre-Procedure  Phone Note Outgoing Call Call back at [facility] Phone [phone]   Call placed by: Eliezer Lofts, EMT-P,  June 09, 2010 1:41 PM Action Taken: Phone Call Completed Summary of Call: Left message with information on Myoview Information Sheet (see scanned document for details).     Nuclear Med Background Indications for Stress Test: Evaluation for Ischemia, Graft Patency   History: Asthma, CABG, COPD, Echo, Heart Catheterization, Myocardial Infarction, Myocardial Perfusion Study  History Comments: '82 CABG x4; re-do '02 x2 '99 Echo: EF=55%, NL '08 MPS: EF=68%, piror infarct in Lat/post.Lat. wall w/ mild-mod P.I.  Symptoms: Chest Pain, Chest Pain with Exertion, Chest Pressure, Chest Pressure with Exertion    Nuclear Pre-Procedure Cardiac Risk Factors: Family History - CAD, History of Smoking, Hypertension, Lipids, NIDDM, PVD Height (in): 60.75

## 2011-01-05 NOTE — Letter (Signed)
Summary: Primary Care Consult Scheduled Letter  [facility]   [place], [place] [address]   Phone: [phone]  Fax: [phone]      12/23/2009 MRN: [id number]  [facility] [address] [place], [place]  [address]    Dear Ms. Merleen Nicely,    We have scheduled an appointment for you.  At the recommendation of Dr. Unice Cobble, we have scheduled you a consult with Dr. Morene Antu with [facility] on 01-16-2010 at 9:30am.  Their address is [address], [place], [place] [address]. The office phone number is [phone].  If this appointment day and time is not convenient for you, please feel free to call the office of the doctor you are being referred to at the number listed above and reschedule the appointment.    It is important for you to keep your scheduled appointments. We are here to make sure you are given good patient care.   Thank you,    Renee, Patient Care Coordinator [facility]

## 2011-01-05 NOTE — Miscellaneous (Signed)
Summary: Orders Update pft charges  Clinical Lists Changes  Orders: Added new Service order of Carbon Monoxide diffusing w/capacity (94720) - Signed Added new Service order of Lung Volumes (94240) - Signed Added new Service order of Spirometry (Pre & Post) (94060) - Signed 

## 2011-01-05 NOTE — Assessment & Plan Note (Signed)
Summary: DISCUSS LACK OF SLEEPING/KDC   Vital Signs:  Patient profile:   77 year old female Weight:      136.4 pounds Pulse rate:   64 / minute Resp:     18 per minute BP sitting:   104 / 66  (left arm) Cuff size:   regular  Vitals Entered By: Georgette Dover (December 11, 2009 4:07 PM) CC: Patient is here with concerns of being restless due to a lack of sleep. Patient states "Im a nervours wreck and very irritated, I can't go another day like this." Patient said she would like a med that is specifically for sleep. Patient would also like to discuss restarting Cymbalta Comments REVIEWED MED LIST, PATIENT AGREED DOSE AND INSTRUCTION CORRECT    Primary Care Provider:  Unice Cobble, MD  CC:  Patient is here with concerns of being restless due to a lack of sleep. Patient states "Im a nervours wreck and very irritated and I can't go another day like this." Patient said she would like a med that is specifically for sleep. Patient would also like to discuss restarting Cymbalta.  History of Present Illness: Unable to sleep or sleeping only 3 hours  since 10/2009 w/o trigger. Dr Erling Cruz Rxed Xanax 0.25 mg  at bedtime w/o benefit ; Trazadone  was then Rxed but  not taken  due to concerns about adverse effects. Cymbalta D/Ced in 09/2009 by Dr Irven Baltimore  to see if it  was cause of of  sweats.  Cymbalta  helped the color changes& burning in feet . Lyrica had caused diffuse swelling.  Allergies: 1)  ! Codeine 2)  ! Percodan 3)  ! Pcn 4)  ! Sulfa 5)  ! Morphine 6)  ! Talwin 7)  ! Demerol 8)  ! Ultram 9)  ! * Avelox 10)  ! Reglan 11)  ! Vicodin 12)  ! * Actos 13)  ! Lortab 14)  ! Metformin Hcl (Metformin Hcl) 15)  ! Metoprolol Succinate (Metoprolol Succinate)  Review of Systems General:  Complains of fatigue and sleep disorder. Resp:  Denies excessive snoring, hypersomnolence, and morning headaches. Psych:  Complains of anxiety; denies depression, easily angered, easily tearful, and  irritability.  Physical Exam  General:  Coarse jerking of head ; agitated about insomnia Eyes:  No lid lag  Neck:  No deformities, masses, or tenderness noted. Lungs:  Normal respiratory effort, chest expands symmetrically. Lungs are clear to auscultation, no crackles or wheezes. Heart:  Normal rate and regular rhythm. S1 and S2 normal without gallop, murmur, click, rub . Extremities:  No clubbing, cyanosis, edema. Plethora of hands Neurologic:  alert & oriented X3.   No tremor    Impression & Recommendations:  Problem # 1:  SLEEP DISORDER (ICD-780.50)  Complete Medication List: 1)  Glimepiride 2 Mg Tabs (Glimepiride) .... Take 1 am and 1/2 pm 2)  Crestor 40 Mg Tabs (Rosuvastatin calcium) .... Take one tablet by mouth daily. 3)  Freestyle Lite Test Strp (Glucose blood) .... Test 1-2 times a day 4)  Vitamin D 1000 Unit Tabs (Cholecalciferol) .Marland Kitchen.. 1 by mouth once daily 5)  Cymbalta 60 Mg Cpep (Duloxetine hcl) .... As needed 6)  Metformin Hcl 500 Mg Tabs (Metformin hcl) .Marland Kitchen.. 1 by mouth once daily 7)  Nitroglycerin 0.4 Mg Subl (Nitroglycerin) .... One tablet under tongue every 5 minutes as needed for chest pain---may repeat times three 8)  Levothyroxine Sodium 150 Mcg Tabs (Levothyroxine sodium) .... Take 1/2 tablet daily 9)  Aspirin Ec 325 Mg Tbec (Aspirin) .... Take one tablet by mouth daily 10)  Nexium 40 Mg Cpdr (Esomeprazole magnesium) .... As needed 11)  Prilosec Otc 20 Mg Tbec (Omeprazole magnesium) .... As needed 12)  Zolpidem Tartrate 5 Mg Tabs (Zolpidem tartrate) .Marland Kitchen.. 1 at bedtime as needed  Patient Instructions: 1)  Sleeping med trial as discussed. Prescriptions: CYMBALTA 60 MG  CPEP (DULOXETINE HCL) as needed  #30 Capsule x 5   Entered and Authorized by:   Unice Cobble MD   Signed by:   Unice Cobble MD on 12/11/2009   Method used:   Print then Give to Patient   RxID:   [id number] ZOLPIDEM TARTRATE 5 MG TABS (ZOLPIDEM TARTRATE) 1 at bedtime as needed  #10 x 0    Entered and Authorized by:   Unice Cobble MD   Signed by:   Unice Cobble MD on 12/11/2009   Method used:   Printed then faxed to ...       [facility] [address] [phone]* (retail)       [address]       [place], [place]  [address]       Ph: [phone] or [phone]       Fax: [phone]   RxID:   [id number]

## 2011-01-07 NOTE — Progress Notes (Signed)
Summary: Propofol sedation  Phone Note Call from Patient Call back at Home Phone [phone]   Caller: Patient Call For: Dr. Henrene Pastor Reason for Call: Talk to Nurse Summary of Call: Pt needs to speak with Malachy Mood again Initial call taken by: Martinique Johnson,  December 29, 2010 1:43 PM  Follow-up for Phone Call        Pt. says after thinking about having Propofol she said she would rather have conscious sedation because she had all of her problems before and did fine and she is afraid since her daughter was oversedated and almost died. Wants D.r. Henrene Pastor to be aware of her concern  but will agree to Propofol if he rec. it  I had explained your reasoning for Propofol when I scheduled her. Follow-up by: Abel Presto RN,  December 29, 2010 3:29 PM  Additional Follow-up for Phone Call Additional follow up Details #1::        I think propofol with CRNA is safer Additional Follow-up by: Irene Shipper MD,  December 29, 2010 3:34 PM    Additional Follow-up for Phone Call Additional follow up Details #2::    She is agreeable to Propofol sedation. Follow-up by: Abel Presto RN,  December 29, 2010 3:51 PM

## 2011-01-07 NOTE — Assessment & Plan Note (Signed)
Summary: f20m   Visit Type:  6 months follow up Primary Provider:  Unice Cobble, MD  CC:  No complaints.  History of Present Illness: This is a 77 year old woman with coronary artery disease status post CABG, presenting for followup evaluation. She underwent initial coronary bypass surgery in 1982, and had a redo surgery in 2002.       Current Medications (verified): 1)  Glimepiride 2 Mg Tabs (Glimepiride) .... Take 1 Am and 1/2 Pm, **labs Due Now** 2)  Crestor 40 Mg Tabs (Rosuvastatin Calcium) .... Take One Tablet By Mouth Daily. 3)  Freestyle Lite Test   Strp (Glucose Blood) .... Test 1-2 Times A Day 4)  Vitamin D 1000 Unit  Tabs (Cholecalciferol) .Marland Kitchen.. 1 By Mouth Once Daily 5)  Cymbalta 60 Mg  Cpep (Duloxetine Hcl) .... As Needed 6)  Metformin Hcl 500 Mg Tabs (Metformin Hcl) .Marland Kitchen.. 1 By Mouth Once Daily**appointment Due12/2011** 7)  Nitroglycerin 0.4 Mg Subl (Nitroglycerin) .... One Tablet Under Tongue Every 5 Minutes As Needed For Chest Pain---May Repeat Times Three 8)  Levothyroxine Sodium 150 Mcg Tabs (Levothyroxine Sodium) .... Take 1/2 Tablet Daily 9)  Aspirin Ec 325 Mg Tbec (Aspirin) .... Take One Tablet By Mouth Daily 10)  Trazodone Hcl 50 Mg Tabs (Trazodone Hcl) .... Take 1 Tablet By Mouth Two Times A Day 11)  Reclast 5 Mg/157ml Soln (Zoledronic Acid) .... Once A Year 12)  Prochlorperazine Maleate 10 Mg Tabs (Prochlorperazine Maleate) .... Take 1 Tablet By Mouth Every 4-6 Hours As Needed Nausea 13)  Nexium 40 Mg Cpdr (Esomeprazole Magnesium) .... Take 1 Tablet By Mouth Once A Day  Allergies: 1)  ! Codeine 2)  ! Percodan 3)  ! Pcn 4)  ! Sulfa 5)  ! Morphine 6)  ! Talwin 7)  ! Demerol 8)  ! Ultram 9)  ! * Avelox 10)  ! Reglan 11)  ! Vicodin 12)  ! * Actos 13)  ! Lortab 14)  ! Metformin Hcl (Metformin Hcl) 15)  ! Metoprolol Succinate (Metoprolol Succinate) 16)  ! Penicillin  Past History:  Past medical history reviewed for relevance to current acute and chronic  problems.  Past Medical History: Reviewed history from 05/28/2010 and no changes required. CAD, ARTERY BYPASS GRAFT (ICD-414.04)x 2 MYOCARDIAL INFARCTION, HX OF (ICD-412) HYPERTENSION, ESSENTIAL NOS (ICD-401.9) HYPERLIPIDEMIA (ICD-272.2) PVD (ICD-443.9) CAROTID BRUIT (ICD-785.9) GERD (ICD-530.81) ANXIETY (ICD-300.00) DIABETES MELLITUS, TYPE II, CONTROLLED, MILD (ICD-250.00) UNSPECIFIED MYALGIA AND MYOSITIS (ICD-729.1) DYSPHAGIA (ICD-787.29) EROSIVE ESOPHAGITIS (ICD-530.19), PMH of HEMORRHOIDS (ICD-455.6) CONSTIPATION, CHRONIC (ICD-564.09) DIVERTICULOSIS, COLON (ICD-562.10) HIATAL HERNIA (ICD-553.3) DEGENERATIVE JOINT DISEASE (ICD-715.90) ESOPHAGEAL STRICTURE (ICD-530.3), PMH of HEARING LOSS (ICD-389.9) HYPOTHYROIDISM (ICD-244.9)  Review of Systems       Bilateral foot numbness and pain secondary to neuropathy, otherwise negative except as per HPI   Vital Signs:  Patient profile:   77 year old female Height:      60.75 inches Weight:      139.50 pounds BMI:     26.67 Pulse rate:   61 / minute Pulse rhythm:   regular Resp:     18 per minute BP sitting:   120 / 64  (left arm) Cuff size:   regular  Vitals Entered By: Sidney Ace (December 10, 2010 2:36 PM)  Physical Exam  General:  General:  Pt is alert and oriented, elderly woman, in no acute distress. HEENT: normal Neck: normal carotid upstrokes without bruits, JVP normal Lungs: CTA CV: RRR without murmur or gallop Abd: soft, NT, positive BS,  no bruit, no organomegaly Ext: no clubbing, cyanosis, or edema. peripheral pulses 2+ and equal Skin: warm and dry without rash   Nuclear Study  Procedure date:  06/10/2010  Findings:      Overall Impression   Exercise Capacity: Lexiscan study BP Response: Normal blood pressure response. Clinical Symptoms: Headache ECG Impression: No significant ST segment change suggestive of ischemia. Overall Impression: Mild to moderate partially reversible basal to mid  inferolateral and anterolateral perfusion defect.  Overall Impression Comments: This is suggestive of mixed ischemia and infarction in the basal to mid anterolateral and inferolateral walls.  EF is preserved.  I compared the old study from 2006 and there does not appear to be significant change from that time.     Signed by Loralie Champagne, MD on 06/10/2010 at 4:39 PM  EKG  Procedure date:  12/10/2010  Findings:      NSR 61 bpm, inferior infarct age-indeterminate, ST-T changes consider inferolateral ischemia.  Impression & Recommendations:  Problem # 1:  CAD, ARTERY BYPASS GRAFT (ICD-414.04) Pt is stable without angina. Myoview reviewed and was unchanged compared with 2006 study. Continue current medical program.  Her updated medication list for this problem includes:    Nitroglycerin 0.4 Mg Subl (Nitroglycerin) ..... One tablet under tongue every 5 minutes as needed for chest pain---may repeat times three    Aspirin Ec 325 Mg Tbec (Aspirin) .Marland Kitchen... Take one tablet by mouth daily  Orders: EKG w/ Interpretation (93000)  Problem # 2:  HYPERTENSION, ESSENTIAL NOS (ICD-401.9) BP controlled.  Her updated medication list for this problem includes:    Aspirin Ec 325 Mg Tbec (Aspirin) .Marland Kitchen... Take one tablet by mouth daily  BP today: 120/64 Prior BP: 112/60 (08/04/2010)  Prior 10 Yr Risk Heart Disease: N/A (09/07/2007)  Labs Reviewed: K+: 3.7 (05/28/2010) Creat: : 0.7 (05/28/2010)   Chol: 165 (02/23/2010)   HDL: 51.70 (02/23/2010)   LDL: DEL (07/17/2008)   TG: 206.0 (02/23/2010)  Problem # 3:  HYPERLIPIDEMIA (ICD-272.2) Direct LDL 99 mg/dL. Pt tolerating high-dose statin Rx without adverse effects.  Her updated medication list for this problem includes:    Crestor 40 Mg Tabs (Rosuvastatin calcium) .Marland Kitchen... Take one tablet by mouth daily.  CHOL: 165 (02/23/2010)   LDL: DEL (07/17/2008)   HDL: 51.70 (02/23/2010)   TG: 206.0 (02/23/2010) CHOL (goal): 200 (09/07/2007)   LDL (goal): 70  (07/25/2008)   HDL (goal): 40 (09/07/2007)   TG (goal): 150 (09/07/2007)  Patient Instructions: 1)  Your physician recommends that you continue on your current medications as directed. Please refer to the Current Medication list given to you today. 2)  Your physician wants you to follow-up in:   1 YEAR. You will receive a reminder letter in the mail two months in advance. If you don't receive a letter, please call our office to schedule the follow-up appointment.  Appended Document: f29m HPI: She is doing well at present. Denies chest pain, dyspnea, palpitations, lightheadedness, or syncope. Has no specific complaints today. Reports good medication adherence.

## 2011-01-07 NOTE — Letter (Signed)
Summary: EGD Instructions  [facility]  [place], [place] [address]   Phone: [phone]  Fax: [phone]       Janet Steele    [birth year]    MRN: [id number]       Procedure Day [name]:  Duke Salvia 01/05/11     Arrival Time: 10:30am     Procedure Time: 11:30am     Location of Procedure:                    _ X _ [place] (4th Floor)   PREPARATION FOR ENDOSCOPY   On TUESDAY, 01/05/11  THE DAY OF THE PROCEDURE:  1.   No solid foods, milk or milk products are allowed after midnight the night before your procedure.  2.   Do not drink anything colored red or purple.  Avoid juices with pulp.  No orange juice.  3.  You may drink clear liquids until 9:30am , which is 2 hours before your procedure.                                                                                                CLEAR LIQUIDS INCLUDE: Water Jello Ice Popsicles Tea (sugar ok, no milk/cream) Powdered fruit flavored drinks Coffee (sugar ok, no milk/cream) Gatorade Juice: apple, white grape, white cranberry  Lemonade Clear bullion, consomm, broth Carbonated beverages (any kind) Strained chicken noodle soup Hard Candy   MEDICATION INSTRUCTIONS  Unless otherwise instructed, you should take regular prescription medications with a small sip of water as early as possible the morning of your procedure.  Diabetic patients - see separate instructions.            OTHER INSTRUCTIONS  You will need a responsible adult at least 75 years of age to accompany you and drive you home.   This person must remain in the waiting room during your procedure.  Wear loose fitting clothing that is easily removed.  Leave jewelry and other valuables at home.  However, you may wish to bring a book to read or an iPod/MP3 player to listen to music as you wait for your procedure to start.  Remove all body piercing jewelry and leave at home.  Total time from sign-in until  discharge is approximately 2-3 hours.  You should go home directly after your procedure and rest.  You can resume normal activities the day after your procedure.  The day of your procedure you should not:   Drive   Make legal decisions   Operate machinery   Drink alcohol   Return to work  You will receive specific instructions about eating, activities and medications before you leave.    The above instructions have been reviewed and explained to me by   Emerson Monte RN  December 30, 2010 2:07 PM     I fully understand and can verbalize these instructions _____________________________ Date _________

## 2011-01-07 NOTE — Miscellaneous (Signed)
Summary: [facility] Previsit/prep  Clinical Lists Changes  Allergies: Changed allergy or adverse reaction from PENICILLIN to PENICILLIN Observations: Added new observation of ALLERGY REV: Done (12/30/2010 13:28)

## 2011-01-07 NOTE — Progress Notes (Signed)
Summary: Pain in esophagus  Phone Note Call from Patient Call back at Home Phone [phone]   Call For: Dr Henrene Pastor Reason for Call: Talk to Nurse Summary of Call: Has pain in her esophagus and everytime she coughs she throws up, has a cough. Would like an appt as soon as possible. Initial call taken by: Irwin Brakeman [facility],  December 25, 2010 8:23 AM  Follow-up for Phone Call        Patient c/o dysphagia to liquids and solids.  patient reports symptoms have been worsening for the last few months.  She discribes it as a sticking sensation "between my brests".  She c/o continuous pain in her esophagus.  I have scheduled her for an appointment with Dr Henrene Pastor on Monday 12/28/10. Follow-up by: Barb Merino RN, [place],  December 25, 2010 8:53 AM

## 2011-01-07 NOTE — Progress Notes (Signed)
Summary: Possible testing  Phone Note Call from Patient Call back at Home Phone [phone]   Caller: Patient Call For: Dr. Henrene Pastor Reason for Call: Talk to Nurse Summary of Call: Pt was seen yesterday and wants to speak with nurse to discuss procedure we suggested she have Initial call taken by: Martinique Johnson,  December 29, 2010 9:43 AM  Follow-up for Phone Call         Pleas advise on what test  you discussed with [place]  and if she decides she wants it done should I go ahead and schedule it? Follow-up by: Abel Presto RN,  December 29, 2010 9:57 AM  Additional Follow-up for Phone Call Additional follow up Details #1::        We taked about repeat EGD with dilation. She wanted to wait and see how it went w/ new med first Additional Follow-up by: Irene Shipper MD,  December 29, 2010 10:20 AM    Additional Follow-up for Phone Call Additional follow up Details #2::    She wants to go aheadwith EGD/Dil because about 4 pm she starts coughing and then throws up.Can she be scheduled on 1/31 it is your propofol day but I don't have another spot.What type of dilators do you plan to use? Follow-up by: Abel Presto RN,  December 29, 2010 11:50 AM  Additional Follow-up for Phone Call Additional follow up Details #3:: Details for Additional Follow-up Action Taken: balloon dilators. she is a good propofol canidate " difficult to sedate and cardiopulmonary comorbidities" Additional Follow-up by: Irene Shipper MD,  December 29, 2010 12:03 PM  Scheduled for balloon/dil on1/31/12 at [facility] under Propofol.Will come for pre-visit tomorrow.

## 2011-01-07 NOTE — Assessment & Plan Note (Signed)
Summary: GERD and dysphagia   History of Present Illness Visit Type: Follow-up Visit Primary GI MD: Scarlette Shorts MD Primary Provider: Unice Cobble, MD Requesting Provider: na Chief Complaint: Pt c/o epigastric pain, dysphagia with solids and liquids and vomiting because feels like food or whatever she is drinkning is getting stuck  History of Present Illness:   77 year old female with multiple significant medical problems including severe coronary artery disease status post coronary artery bypass grafting on 2 separate occasions, hypertension, diabetes mellitus, dyslipidemia, hypothyroidism, and GERD with associated esophageal dysmotility and peptic stricture. She presents today with complaints of regurgitation with coughing, pyrosis, liquid and solid food dysphagia with associated chest discomfort. She was last seen December 10, 2009 when she underwent upper endoscopy with balloon dilation to a maximal diameter of 20 mm for complaints of dysphagia. She does not think that this was helpful. She was to have stayed on PPI but currently takes Zantac 75 once daily. She is not on Nexium currently. No lower GI complaints. Last colonoscopy May 2010   GI Review of Systems    Reports abdominal pain, dysphagia with liquids, dysphagia with solids, and  vomiting.     Location of  Abdominal pain: epigastric area.    Denies acid reflux, belching, bloating, chest pain, heartburn, loss of appetite, nausea, vomiting blood, weight loss, and  weight gain.        Denies anal fissure, black tarry stools, change in bowel habit, constipation, diarrhea, diverticulosis, fecal incontinence, heme positive stool, hemorrhoids, irritable bowel syndrome, jaundice, light color stool, liver problems, rectal bleeding, and  rectal pain.    Current Medications (verified): 1)  Glimepiride 2 Mg Tabs (Glimepiride) .... Take 1 Am and 1/2 Pm, **labs Due Now** 2)  Crestor 40 Mg Tabs (Rosuvastatin Calcium) .... Take One Tablet By Mouth  Daily. 3)  Freestyle Lite Test   Strp (Glucose Blood) .... Test 1-2 Times A Day 4)  Vitamin D 1000 Unit  Tabs (Cholecalciferol) .Marland Kitchen.. 1 By Mouth Once Daily 5)  Cymbalta 60 Mg  Cpep (Duloxetine Hcl) .... As Needed 6)  Metformin Hcl 500 Mg Tabs (Metformin Hcl) .Marland Kitchen.. 1 By Mouth Once Daily**appointment Due12/2011** 7)  Nitroglycerin 0.4 Mg Subl (Nitroglycerin) .... One Tablet Under Tongue Every 5 Minutes As Needed For Chest Pain---May Repeat Times Three 8)  Levothyroxine Sodium 150 Mcg Tabs (Levothyroxine Sodium) .... Take 1/2 Tablet Daily 9)  Aspirin Ec 325 Mg Tbec (Aspirin) .... Take One Tablet By Mouth Daily 10)  Trazodone Hcl 50 Mg Tabs (Trazodone Hcl) .... Take 2  Tablets By Mouth At Bedtime 11)  Reclast 5 Mg/189ml Soln (Zoledronic Acid) .... Once A Year 12)  Prochlorperazine Maleate 10 Mg Tabs (Prochlorperazine Maleate) .... Take 1 Tablet By Mouth Every 4-6 Hours As Needed Nausea 13)  Nexium 40 Mg Cpdr (Esomeprazole Magnesium) .... Take 1 Tablet By Mouth Once A Day  Allergies (verified): 1)  ! Codeine 2)  ! Percodan 3)  ! Pcn 4)  ! Sulfa 5)  ! Morphine 6)  ! Talwin 7)  ! Demerol 8)  ! Ultram 9)  ! * Avelox 10)  ! Reglan 11)  ! Vicodin 12)  ! * Actos 13)  ! Lortab 14)  ! Metformin Hcl (Metformin Hcl) 15)  ! Metoprolol Succinate (Metoprolol Succinate) 16)  ! Penicillin  Past History:  Past Medical History: COUGH (ICD-786.2) CHEST PAIN (ICD-786.50) ENCOUNTER FOR LONG-TERM USE OF OTHER MEDICATIONS (ICD-V58.69) ABNORMAL INVOLUNTARY MOVEMENTS (ICD-781.0) SLEEP DISORDER (ICD-780.50) CAD, ARTERY BYPASS GRAFT (ICD-414.04) x2 MYOCARDIAL  INFARCTION, HX OF (ICD-412) HYPERTENSION, ESSENTIAL NOS (ICD-401.9) HYPERLIPIDEMIA (ICD-272.2) PVD (ICD-443.9) CAROTID BRUIT (ICD-785.9) GERD (ICD-530.81) ANXIETY (ICD-300.00) DIABETES MELLITUS, TYPE II, CONTROLLED, MILD (ICD-250.00) DYSPHAGIA (ICD-787.29) EROSIVE ESOPHAGITIS (ICD-530.19) HEMORRHOIDS (ICD-455.6) CONSTIPATION, CHRONIC  (ICD-564.09) DIVERTICULOSIS, COLON (ICD-562.10) HIATAL HERNIA (ICD-553.3) DEGENERATIVE JOINT DISEASE (ICD-715.90) ASTHMATIC BRONCHITIS, ACUTE (ICD-466.0) ESOPHAGEAL STRICTURE (ICD-530.3) HEARING LOSS (ICD-389.9) HYPOTHYROIDISM (ICD-244.9) Family Hx of HX, FAMILY, MALIGNANCY, KIDNEY (ICD-V16.51) Family Hx of HX, FAMILY, STROKE (ICD-V17.1) Family Hx of AMI NOS, UNSPECIFIED (ICD-410.90) STRICTURE, ESOPHAGEAL (ICD-530.3) POLYNEUROPATHY, DIABETIC (ICD-357.2) HYPERTRIGLYCERIDEMIA (ICD-272.1)  Past Surgical History: Reviewed history from 09/24/2009 and no changes required. Appendectomy Cholecystectomy Hysterectomy Oophorectomy bilat Coronary artery bypass graft X4 (1982), 02/2002 CABG  x 2 Back surgery (01/2000) Laproscopy  Family History:  Mother died at age 38 with no history of coronary  artery disease.  Her father died at age 60 of an MI.  She has one sister  who has had coronary artery disease.     No FH of Colon Cancer:  Social History: Reviewed history from 09/24/2009 and no changes required.  She lives in [place], [place], with her   husband and is a retired Agricultural engineer.  She quit tobacco in 1977 with  minimal tobacco history.  She has never abused alcohol or drugs.      Review of Systems       The patient complains of allergy/sinus and cough.  The patient denies anemia, anxiety-new, arthritis/joint pain, back pain, blood in urine, breast changes/lumps, change in vision, confusion, coughing up blood, depression-new, fainting, fatigue, fever, headaches-new, hearing problems, heart murmur, heart rhythm changes, itching, menstrual pain, muscle pains/cramps, night sweats, nosebleeds, pregnancy symptoms, shortness of breath, skin rash, sleeping problems, sore throat, swelling of feet/legs, swollen lymph glands, thirst - excessive , urination - excessive , urination changes/pain, urine leakage, vision changes, and voice change.    Vital Signs:  Patient profile:   77 year  old female Height:      60.75 inches Weight:      127 pounds BMI:     24.28 BSA:     1.55 Pulse rate:   60 / minute Pulse rhythm:   regular BP sitting:   132 / 74  (left arm) Cuff size:   regular  Vitals Entered By: Hope Pigeon CMA (December 28, 2010 2:35 PM)  Physical Exam  General:  Well developed, well nourished, no acute distress. Head:  Normocephalic and atraumatic. Eyes:  PERRLA, no icterus. Mouth:  No deformity or lesions. Neck:  Supple; no masses or thyromegaly. Lungs:  Clear throughout to auscultation. Heart:  Regular rate and rhythm; no murmurs, rubs,  or bruits. Abdomen:  Soft, nontender and nondistended. No masses, hepatosplenomegaly or hernias noted. Normal bowel sounds. Pulses:  Normal pulses noted. Extremities:  no edema Neurologic:  Alert and  oriented x4.. Skin:  Intact without significant lesions or rashes. Psych:  Alert and cooperative. Normal mood and affect.   Impression & Recommendations:  Problem # 1:  GERD (ICD-530.81) GERD exacerbated by esophageal dysmotility. Currently with significant symptoms on Zantac 75 once daily.  Plan: #1. Reflux precautions. Told elevate head of bed since she has significant symptoms when lying at night #2. Dexilant 60 mg daily. 40 days of samples have been given for her to try. She will let us know after completing the samples, if they were helpful in any manner #3. Follow up p.r.n.  Problem # 2:  DYSPHAGIA [id number]) recurrent dysphagia. Liquid dysphagia likely due to dysmotility. Solid dysphagia either due to dysmotility and/or  known stricture. I offered her repeat dilation. She wishes to wait this time. If dysphagia worsens we will schedule upper endoscopy with esophageal dilation  Problem # 3:  ESOPHAGEAL STRICTURE (ICD-530.3) Assessment: Comment Only  Patient Instructions: 1)  Dexilant samples given to patient  1 by mouth once daily  2)  Please schedule a follow-up appointment as needed.  3)  Copy sent to :  Unice Cobble, MD 4)  The medication list was reviewed and reconciled.  All changed / newly prescribed medications were explained.  A complete medication list was provided to the patient / caregiver.

## 2011-01-07 NOTE — Letter (Signed)
Summary: Diabetic Instructions  [facility]  [place], [place] [address]   Phone: [phone]  Fax: [phone]    Janet Steele [birth year] MRN: [id number]   _ x _   ORAL DIABETIC MEDICATION INSTRUCTIONS  The day before your procedure:   Take your diabetic pill as you do normally  The day of your procedure:   Do not take your diabetic pill    We will check your blood sugar levels during the admission process and again in Recovery before discharging you home  ________________________________________________________________________

## 2011-01-08 NOTE — Consult Note (Signed)
Summary: [facility]  [facility]   Imported By: Edmonia James 01/22/2010 13:58:52  _____________________________________________________________________  External Attachment:    Type:   Image     Comment:   External Document

## 2011-01-13 NOTE — Procedures (Addendum)
Summary: Upper Endoscopy  Patient: [name] Note: All result statuses are Final unless otherwise noted.  Tests: (1) Upper Endoscopy (EGD)   EGD Upper Endoscopy       [facility]     [place], [place]  [address]           ENDOSCOPY PROCEDURE REPORT           PATIENT:  Janet Steele, Janet Steele  MR#:  [id number]     BIRTHDATE:  1929-03-14, 34 yrs. old  GENDER:  female           ENDOSCOPIST:  Docia Chuck. Geri Seminole, MD     Referred by:  Office           PROCEDURE DATE:  01/05/2011     PROCEDURE:  EGD with balloon dilatation - 18-19-20mm     ASA CLASS:  Class III     INDICATIONS:  dysphagia           MEDICATIONS:   MAC sedation, administered by CRNA; propofol 120mg      IV     TOPICAL ANESTHETIC:  none           DESCRIPTION OF PROCEDURE:   After the risks benefits and     alternatives of the procedure were thoroughly explained, informed     consent was obtained.  The LB [id number] endoscope was     introduced through the mouth and advanced to the second portion of     the duodenum, without limitations.  The instrument was slowly     withdrawn as the mucosa was fully examined.     <<PROCEDUREIMAGES>>           A 43mm ringlike stricture was found in the distal esophagus with     mild  Esophagitis .The stomach was normal.    Retroflexed views     revealed a moderate hiatal hernia.  The duodenum was normal.     THERAPY: SEQUENTIAL TTS BALLOON USED TO DILATE STRICTURE 18-19-20.     mINIMAL RESISTANCE AND HEME. TOLERATED WELL.  The scope was then     withdrawn from the patient and the procedure completed.           COMPLICATIONS:  None           ENDOSCOPIC IMPRESSION:     1) Stricture in the distal esophagus - S/P DILATION TO 20MM     2) Esophagitis in the distal esophagus     3) A hiatal hernia     4) GERD           RECOMMENDATIONS:     1) Clear liquids until 2 PM, then soft foods rest of day. Resume     prior diet tomorrow.     2) Continue  DEXILANT     3) Follow-up PRN           ______________________________     Docia Chuck. Geri Seminole, MD           CC:  Hendricks Limes, MD; The Patient           n.     eSIGNED:   Docia Chuck. Geri Seminole at 01/05/2011 11:50 AM           Reather Littler, [id number]  Note: An exclamation mark (!) indicates a result that was not dispersed into the flowsheet. Document Creation Date: 01/05/2011 11:07 PM _______________________________________________________________________  (1) Order  result status: Final Collection or observation date-time: 01/05/2011 11:40 Requested date-time:  Receipt date-time:  Reported date-time:  Referring Physician:   Ordering Physician: Lavena Bullion [phone]) Specimen Source:  Source: Tawanna Cooler Order Number: [id number] Lab site:

## 2011-02-10 ENCOUNTER — Encounter: Payer: Self-pay | Admitting: Internal Medicine

## 2011-02-10 ENCOUNTER — Telehealth: Payer: Self-pay | Admitting: Internal Medicine

## 2011-02-12 ENCOUNTER — Telehealth (INDEPENDENT_AMBULATORY_CARE_PROVIDER_SITE_OTHER): Payer: Self-pay | Admitting: *Deleted

## 2011-02-15 ENCOUNTER — Telehealth: Payer: Self-pay | Admitting: Internal Medicine

## 2011-02-16 NOTE — Progress Notes (Signed)
Summary: Medication  Phone Note Call from Patient Call back at Home Phone [phone]   Caller: Patient Call For: Dr. Henrene Pastor Reason for Call: Talk to Nurse Summary of Call: Needs a script for Dale...Marland KitchenMarland KitchenGrygla Initial call taken by: Webb Laws,  February 10, 2011 1:51 PM    Prescriptions: DEXILANT 60 MG CPDR (DEXLANSOPRAZOLE) 1 by mouth once daily  #30 x 11   Entered by:   Randye Lobo NCMA   Authorized by:   Irene Shipper MD   Signed by:   Randye Lobo NCMA on 02/10/2011   Method used:   [facility]        [facility] [address] [phone]* (retail)       [address]       [place], [place]  [address]       Ph: [phone] or [phone]       Fax: [phone]   RxID:   [id number]

## 2011-02-20 LAB — GLUCOSE, CAPILLARY: Glucose-Capillary: 145 mg/dL — ABNORMAL HIGH (ref 70–99)

## 2011-02-23 NOTE — Progress Notes (Signed)
Summary: resesnd rx  Phone Note Call from Patient Call back at Home Phone [phone]   Caller: Patient Call For: Dr Henrene Pastor Summary of Call: Patient states that her Dexilant was sent tot he wrong pharmacy, please [facility] it to [place] [phone] Initial call taken by: Ronalee Red,  February 15, 2011 2:09 PM  Follow-up for Phone Call        [facility] and telephoned in to [place].   Follow-up by: Randye Lobo NCMA,  February 15, 2011 2:40 PM    Prescriptions: DEXILANT 60 MG CPDR (DEXLANSOPRAZOLE) 1 by mouth once daily  #30 x 11   Entered by:   Randye Lobo NCMA   Authorized by:   Irene Shipper MD   Signed by:   Randye Lobo NCMA on 02/15/2011   Method used:   Telephoned to ...       [facility] (retail)       [address], [place]  [id number]       Ph: [phone]       Fax: [phone]   RxID:   [id number]

## 2011-02-23 NOTE — Progress Notes (Signed)
Summary: wants shingles shot-waiting for call 3/12--pt called back 3/12  Phone Note Call from Patient Call back at Home Phone [phone]   Caller: Patient Summary of Call: Pt left msg on voicemail would like to know if she is ok to have shingles shot.Malachi Bonds CMA  February 12, 2011 3:26 PM   Follow-up for Phone Call        the definite contraindication is gelatin or Neomycin allergy ; there is concern otherwise because of > 10 med allergies listed Follow-up by: Unice Cobble MD,  February 12, 2011 5:02 PM  Additional Follow-up for Phone Call Additional follow up Details #1::        left msg w/ husband to have patient return call.Marland KitchenMarland KitchenMalachi Bonds CMA  February 15, 2011 8:47 AM   spoke w/ patient aware of infomation.Malachi Bonds CMA  February 15, 2011 10:39 AM

## 2011-02-23 NOTE — Miscellaneous (Signed)
Summary: Shingles vaccine   Shingles vaccine   Imported By: Jamelle Haring 02/18/2011 10:27:49  _____________________________________________________________________  External Attachment:    Type:   Image     Comment:   External Document

## 2011-03-02 ENCOUNTER — Other Ambulatory Visit (INDEPENDENT_AMBULATORY_CARE_PROVIDER_SITE_OTHER): Payer: Medicare Other

## 2011-03-02 DIAGNOSIS — E119 Type 2 diabetes mellitus without complications: Secondary | ICD-10-CM

## 2011-03-02 LAB — HEMOGLOBIN A1C: Hgb A1c MFr Bld: 6.7 % — ABNORMAL HIGH (ref 4.6–6.5)

## 2011-04-02 ENCOUNTER — Other Ambulatory Visit: Payer: Self-pay | Admitting: Internal Medicine

## 2011-04-07 ENCOUNTER — Other Ambulatory Visit: Payer: Self-pay | Admitting: Internal Medicine

## 2011-04-20 NOTE — Assessment & Plan Note (Signed)
Janet Steele                         GASTROENTEROLOGY OFFICE NOTE   TIYANNA, ALERS                       MRN:          [id number]  DATE:06/14/2007                            DOB:          12/21/1928    HISTORY:  Collie Siad presents today for followup. She is a 77 year old with  coronary artery disease status post coronary artery bypass surgery x2,  hypertension, hyperlipidemia, gastroesophageal reflux disease  complicated by peptic stricture, hypothyroidism and chronic  constipation. I last saw the patient February 16, 2007 when she underwent  upper endoscopy for dysphagia and recent food impaction. Upper endoscopy  revealed a distal esophageal stricture which was injected with  triamcinolone. She was subsequently dilated with an 18-mm Savary  dilator. Since that time she has had no recurrent dysphagia. She states  she takes Nexium once daily. She continues intermittently to experience  breakthrough reflux at night. She will occasionally take an additional  Nexium. She tells me that she is compliant with reflux precautions  including elevation of the head of the bed avoiding large meals and  avoiding eating several hours before bedtime.   CURRENT MEDICATIONS:  Listed as aspirin, Nexium, glimepiride, vitamin  supplements, levothyroxine, Zocor, Compazine, and metoprolol. She also  takes Xanax and Cymbalta as well as Boniva.   PHYSICAL EXAMINATION:  GENERAL:  Well-appearing female in no acute  distress. She is alert and oriented. She has involuntary twitching of  her head and eyes.  VITAL SIGNS:  Blood pressure is 124/60, heart rate is 52 and regular,  weight is 141 pounds.  HEENT:  Sclera anicteric.  LUNGS:  Clear.  HEART:  Regular.  ABDOMEN:  Soft without tenderness, mass or hernia.   IMPRESSION:  Gastroesophageal reflux disease complicated by peptic  stricture, no recurrent dysphagia since her last dilation. Still with  some breakthrough reflux  symptoms despite once daily Nexium.   RECOMMENDATIONS:  1. Continue adherence to reflux precautions.  2. Continue Nexium once daily.  3. May use Nexium twice daily if breakthrough symptoms occur      frequently.  4. Ongoing general medical care with Dr. Linna Darner.  5. GI followup as needed.     Docia Chuck. Henrene Pastor, MD  Electronically Signed    JNP/MedQ  DD: 06/14/2007  DT: 06/15/2007  Job #: [id number]

## 2011-04-20 NOTE — Assessment & Plan Note (Signed)
[place]                            CARDIOLOGY OFFICE NOTE   Loreal, Valente Janet Steele                       MRN:          [id number]  DATE:05/02/2007                            DOB:          1929/02/04    Janet Steele was seen in outpatient follow-up at the [facility] Cardiology  office on May 02, 2007.  She is a delightful 77 year old woman who has  been a longtime patient of E. Raymond Gurney, MD.  I saw her at the time  of a recent hospitalization for chest pain.  She presented on May 8 with  an episode of upper abdominal and chest discomfort that occurred after  eating breakfast.  She took to sublingual nitroglycerin after the  discomfort started but they did not provide relief.  She was given an  additional nitroglycerin in the emergency department and that improved  her symptoms.  She was observed overnight and ruled out for a myocardial  infarction.  She did well the following day and was discharged home as  she was pain-free and had ruled out.  An outpatient adenosine Myoview  was performed, and this showed previous infarct in posterolateral wall  with mild to moderate peri-infarct ischemia.  The overall gated left  ventricular ejection fraction was 68%.  There was minimal change on this  study in comparison to the prior study from October 2006.   Since return home, Janet Steele has been doing well.  She complains of  generalized fatigue.  When questioned about chest pain or angina, she  admits to a feeling of tightness in her chest only when she really gets  in a hurry or has to run up the stairs, and otherwise she has no  symptoms during the course of the day.  When she does experience chest  pressure, it is very brief and it stops soon after she rests.  She has  not taken any sublingual nitroglycerin at home other than the one  episode as detailed.  She has no dyspnea or other complaints.   CURRENT MEDICATIONS:  1. Nexium 40 mg daily.  2.  Glimepiride.  3. Cymbalta.  4. Levothyroxine 150 mcg one-half on Monday, Wednesday, Friday and      Sunday and one full tablet on Tuesday, Thursday, and Saturday.  5. Aspirin 325 mg daily.  6. Simvastatin 80 mg daily.   Multiple allergies are listed and include MORPHINE, CODEINE, SULFA,  VICODIN, ULTRAM, PLETAL, PENICILLIN, DEMEROL, REGLAN, MORPHINE and  AVELOX.   PHYSICAL EXAMINATION:  GENERAL:  The patient is alert and oriented.  She  is in no acute distress.  VITAL SIGNS:  Weight is 137 pounds.  Blood pressure is 110/70 in the  right arm and 110/80 in the left arm, heart rate 68, respiratory rate is  16.  HEENT:  Normal.  NECK:  Normal carotid upstrokes without bruits.  Jugular venous pressure  is normal.  LUNGS:  Clear to auscultation bilaterally.  HEART:  Regular rate and rhythm without murmurs or gallops.  ABDOMEN:  Soft, nontender, no organomegaly.  EXTREMITIES:  No clubbing, cyanosis, or edema.  Peripheral pulses are 2+  and equal throughout.   ASSESSMENT:  Janet Steele is currently stable from a cardiovascular  standpoint.  She has known coronary artery disease and is status post  two bypass surgeries, the first of which was in 1983 and her redo  surgery was in 2002.  She has CCS class II stable angina.  Her stress  test results show a stable degree of posterolateral wall ischemia.  She  is not currently on much anti-ischemic therapy, and I elected to start  her on metoprolol ER 25 mg at bedtime.  If she tolerates this low dose,  I will plan on titrating her medication upward at her follow-up visit.  Her last lipid panel was in December of last year and showed a  cholesterol of 182 with triglycerides of 143 and HDL of 45 and LDL of  109.  This was on Vytorin 10/40 mg.  Now that she is on Zocor alone, we  will plan on rechecking her lipids and LFTs at the time of her return  visit and making necessary adjustments.   For follow-up I will plan on seeing Ms. Buelna back in 6  months or sooner  if any new problems arise.     Juanda Bond. Burt Knack, MD  Electronically Signed    MDC/MedQ  DD: 05/02/2007  DT: 05/02/2007  Job #: [id number]   cc:   Docia Chuck. Henrene Pastor, MD  Darrick Penna. Linna Darner, MD,FACP,FCCP

## 2011-04-20 NOTE — Assessment & Plan Note (Signed)
Janet Steele                            CARDIOLOGY OFFICE NOTE   Janet Steele                       MRN:          [id number]  DATE:02/01/2008                            DOB:          Aug 05, 1929    Janet Steele was seen in follow-up at [facility] Cardiology Office on  February 01, 2008.  This is a delightful 77 year old woman with coronary  artery disease and coronary bypass surgery x2 who presents today for  routine follow-up.  She had her initial CABG in 1992 with redo in 2002.  She is doing quite well from a symptomatic standpoint.  She specifically  denies chest pain, dyspnea or edema.  She has been very active lately  and has been doing about 2 miles a walking per day.  She has no symptoms  with this level of exertion.  She does complain of feeling of burning  on her feet and toes.  She underwent a lower extremity duplex scan on  February 4 that showed normal ABIs of 1.1 bilaterally, but she did have  abnormal toe brachial indices suggestive of a small vessel disease.  She  has started wearing socks at bedtime.  This helped alleviate her  symptoms almost completely.   CURRENT MEDICATIONS:  Aspirin 325 mg daily, Nexium 40 mg as needed,  glimepiride 2 mg the morning 1 mg in the evening, vitamin C, vitamin D,  metoprolol 25 mg at that time Crestor 20 mg daily, levothyroxine 75 mcg  daily, metformin 500 mg twice daily.   ALLERGIES:  Include MORPHINE, CODEINE, SULFA, VICODIN, ULTRAM, PLETAL,  PENICILLIN, DEMEROL, REGLAN and AVELOX.   EXAM:  The patient is alert and oriented.  She is in no acute distress.  Weight 138, blood pressure 120/74, heart rate 62, respiratory rate 16.  HEENT:  Normal.  NECK:  Normal carotid upstrokes with a right carotid bruit.  Jugular  venous pressure is normal.  LUNGS:  Clear to auscultation bilaterally.  HEART:  Regular rate and rhythm without murmurs or gallops.  ABDOMEN:  Soft, nontender no organomegaly.  EXTREMITIES:  No clubbing, cyanosis or edema.  Peripheral pulses 2+ and  equal throughout.   ASSESSMENT:  1. Coronary artery disease status post CABG.  Janet Steele is doing well      on her current therapy.  I would recommend any changes except for      reduction in her aspirin dose to 81 mg daily.  Otherwise she can      continue on her same medications without changes.  2. Dyslipidemia.  She is followed by Dr. Linna Darner and is treated with      Crestor.  The most recent copy of her lipids have on file are from      October 2008.  That showed a cholesterol of 185 with an LDL of 113      and HDL of 36.  Not sure if her Crestor dose was increased at that      point.  3. Diabetes managed by Dr. Linna Darner.  4. Carotid bruit.  Will check a carotid duplex to rule  out significant      carotid stenosis.   For follow-up I would like to see Janet Steele back in 6 months.     Juanda Bond. Burt Knack, MD  Electronically Signed    MDC/MedQ  DD: 02/01/2008  DT: 02/02/2008  Job #: [id number]   cc:   Darrick Penna. Linna Darner, MD,FACP,FCCP  Docia Chuck. Henrene Pastor, MD

## 2011-04-20 NOTE — Assessment & Plan Note (Signed)
[place]                         GASTROENTEROLOGY OFFICE NOTE   Janet Steele, Janet Steele                       MRN:          [id number]  DATE:09/08/2007                            DOB:          1929-04-21    REASON FOR EVALUATION:  Reflux.   HISTORY:  This is a 77 year old female with a history of hypertension,  diabetes, and severe coronary artery disease with coronary artery bypass  grafting with repeat bypass grafting, gastroesophageal reflux disease  complicated by peptic stricture.  Also, hypothyroidism and chronic  constipation.  The patient was last evaluated in the office June 14, 2007.  At that time, she was having breakthrough symptoms.  She was  asked to use Nexium for breakthrough.  Her last upper endoscopy was in  March.  At that time, triamcinolone was injected in her distal  stricture.  She was subsequently dilated with an 18 mm Savary dilator.  She has a 4 mm hiatal hernia.  She tells me now that her chief complaint  is that of breakthrough reflux and regurgitation.  She tells me that she  is compliant with twice-daily Nexium.  Despite this, she still gets  heartburn.  At night, she has regurgitation.  Also problems with chronic  cough.  She is having some recurrent dysphagia.  No other complaints.  No abdominal pain.   ALLERGIES:  MORPHINE, CODEINE, SULFA, VICODIN, ULTRAM, PLETAL,  PENICILLIN, DEMEROL, REGLAN, AVELOX.   CURRENT MEDICATIONS:  Aspirin.  Nexium.  Glimepiride.  Vitamin  supplements.  Levothyroxine.  Metoprolol.  Crestor.   PHYSICAL EXAM:  Well-appearing female in no acute distress.  Blood pressure 128/72, heart rate 70, weight 138 pounds.  HEENT:  Sclerae anicteric.  LUNGS:  Clear.  HEART:  Regular.  ABDOMEN:  Soft without tenderness, mass, or hernia.  Good bowel sounds  heard.   IMPRESSION:  1. Gastroesophageal reflux disease with significant symptoms despite      twice daily Nexium.  Breakthrough heartburn and  regurgitation.  2. History of peptic stricture requiring multiple esophageal dilations      as well as multiple endoscopies for acute food impactions.  3. Multiple significant medical problems including coronary artery      bypass grafting x2.   RECOMMENDATIONS:  This is an extremely difficult case.  This patient  would be best served with antireflux surgery.  Unfortunately, she tells  me that she is not interested, as she is not a surgical candidate due to  her heart disease (which may be true).  In terms of medical therapy, we  can increase her proton pump inhibitor to 3 times daily.  She is not a  candidate for Reglan, as she is allergic.  She tells me that she is  maximizing reflux precautions, as well as watching the portions of her  meals, the timing of her meals, and providing elevation of the head of  the bed.  We also discussed developing endoscopic techniques for reflux,  though these are not available to the community and only experimental.  As such, she will try t.i.d. Nexium and continue strict adherence to  reflux precautions.  I have given her multiple samples to assist with  drug cost concerns.     Docia Chuck. Henrene Pastor, MD  Electronically Signed    JNP/MedQ  DD: 09/08/2007  DT: 09/09/2007  Job #: [id number]   cc:   Darrick Penna. Linna Darner, MD,FACP,FCCP

## 2011-04-20 NOTE — Assessment & Plan Note (Signed)
[place]                            CARDIOLOGY OFFICE NOTE   Janet, Steele                       MRN:          [id number]  DATE:10/01/2008                            DOB:          Feb 16, 1929    Janet Steele was seen in followup at the [facility] Cardiology Office on  October 01, 2008.  She is a 77 year old woman with coronary artery  disease and 2 prior bypass surgeries who presents today for followup.  She underwent initial coronary bypass in 1992 and redo bypass in 2002.  She continues to do well from a symptomatic standpoint.  She denies  chest pain, dyspnea, orthopnea, PND, or edema.  She continues with an  active lifestyle.  At her last visit, she complained of burning  sensation in her feet and toes, but this is improved since she has  resumed Cymbalta.  She walks her dog regularly up to a distance of two  miles.   MEDICATIONS:  1. Nexium 40 mg daily as needed.  2. Glimepiride 2 mg one in the morning, one-half in the evening.  3. Metoprolol ER 25 mg at bedtime.  4. Crestor 20 mg daily.  5. Levothyroxine 75 mcg daily.  6. Metformin 500 mg twice daily.  7. Aspirin 81 mg daily.  8. Cymbalta 60 mg daily.  9. Vitamin D 1 g daily.   DRUG ALLERGIES:  MORPHINE, CODEINE, SULFA, VICODIN, ULTRAM, PLETAL,  PENICILLIN, DEMEROL, REGLAN, and AVELOX.   PHYSICAL EXAMINATION:  GENERAL:  The patient is alert and oriented.  She  is in no acute distress.  VITAL SIGNS:  Weight is 133 pounds, blood pressure 136/64, heart rate  57, respiratory rate 12.  HEENT:  Normal.  NECK:  Normal carotid upstrokes.  JVP normal.  There is a soft right  carotid bruit.  LUNGS:  Clear bilaterally.  HEART:  Regular rate and rhythm.  No murmurs or gallops.  ABDOMEN:  Soft, nontender.  No organomegaly.  EXTREMITIES:  No clubbing, cyanosis, or edema.  Pedal pulses are intact  and equal.   EKG shows normal sinus rhythm with age indeterminate inferoposterior  infarct,  unchanged from prior tracing.   ASSESSMENT:  1. Coronary artery disease status post coronary artery bypass      grafting.  The patient remains free of angina.  Her last stress      Myoview scan was in May 2008 and demonstrated previous infarct in      the lateral and posterolateral walls with mild-to-moderate peri-      infarct ischemia.  Gated left ventricular ejection fraction was      68%.  Continue on current medical regimen, which includes aspirin,      beta-blocker, and statin.  2. Dyslipidemia.  The patient is followed by Dr. Linna Darner.  She      continues on Crestor.  3. Type 2 diabetes.  4. Asymptomatic carotid bruit.  Carotid ultrasound from March 2009      showed no significant stenosis bilaterally.   Overall, Janet Steele remains remarkably stable with respect to her coronary  artery disease.  PLAN:  Followup in 12 months.     Juanda Bond. Burt Knack, MD  Electronically Signed    MDC/MedQ  DD: 10/01/2008  DT: 10/02/2008  Job #: [id number]   cc:   Darrick Penna. Linna Darner, MD,FACP,FCCP  Docia Chuck. Henrene Pastor, MD

## 2011-04-20 NOTE — H&P (Signed)
Janet, Steele NO.:  [id number]   MEDICAL RECORD NO.:  [id number]          PATIENT TYPE:  INP   LOCATION:  1823                         FACILITY:  [facility]   PHYSICIAN:  Shaune Pascal. Bensimhon, MDDATE OF BIRTH:  21-May-1929   DATE OF ADMISSION:  04/13/2007  DATE OF DISCHARGE:                              HISTORY & PHYSICAL   PRIMARY CARE PHYSICIAN:  Dr. Linna Darner.   PRIMARY CARDIOLOGIST:  Dr. Burt Knack   CHIEF COMPLAINT:  Chest pain.   HISTORY OF PRESENT ILLNESS:  [name] is a 77 year old female with a  history of coronary artery disease.  Yesterday she complained of a  sluggish feeling without any specific symptoms.  This a.m. she had the  same feeling and at approximately 12:30, which was an hour after she ate  a small bowl of oatmeal, she had onset of substernal chest pain.  She  states it radiated into her upper abdomen and back up into her chest.  There was no other radiation.  She took aspirin and some old sublingual  nitroglycerin x2.  The pain reached an 8/10 at its worst.  In the  emergency room she is currently at a 4/10.  She states that she has not  eaten since 11:30 this morning and drank only water today.  She did not  try any other medications for her pain.  She has some slight shortness  of breath with this as well as some nausea but no vomiting and  diaphoresis.  She states that she has had angina in the past that was  similar to these symptoms, but her MI symptoms were both left arm pain  with chest pain that did not start until later.  Currently she is still  having some discomfort but the discomfort is worse in her upper abdomen  and in her chest.  After one sublingual nitroglycerin for chest  discomfort was completely relieved but her abdominal discomfort is still  present.   PAST MEDICAL HISTORY:  1. Status post aortocoronary bypass surgery in 1982 with SVG to      diagonal and LAD, SVG to OM, SVG to PDA.  2. Status post aortocoronary  bypass surgery in 2002 with LIMA to LAD.  3. Status post cardiac catheterization in 2002 with SVG to OM total,      SVG to RCA patent with a 90% distal stenosis, SVG to diagonal      significantly deceased with the limbs from the diagonal to the LAD      patent and the LAD, OM and RCA all totaled proximally.  4. Preserved left ventricular function with an EF of 55% by Myoview in      2006, mild inferior defect with partial reversibility but unchanged      from a previous study, medical therapy recommended.  5. Status post MI in 65 and 1982.  6. Diabetes.  7. Hyperlipidemia  8. Family history of coronary artery disease.  9. Gastroesophageal reflux disease/esophageal stricture.  10.Hypothyroid.  11.Degenerative joint disease.  12.Diabetic neuropathy.   SURGICAL HISTORY:  She is status post cardiac catheterization  x2 as well  as bypass surgery x2, cholecystectomy, hysterectomy, back surgery and  EGD with dilatation.   ALLERGIES:  She gets a rash with MULTIPLE PAIN MEDICATIONS as well as  some ANTIBIOTICS.  She is allergic to REGLAN, PLENDIL, CODEINE,  MORPHINE, PERCODAN, SULFA, PENICILLIN, ULTRAM, HYDROCODONE, AVELOX,  TALWIN, DEMEROL.   CURRENT MEDICATIONS:  1. Synthroid 150 mcg daily.  2. Aspirin 325 mg daily.  3. Cymbalta 60 mg nightly.  4. Nexium 40 mg daily.  5. Xanax 0.5 mg nightly.  6. Glimepiride 2 mg tablets, half tablet on Monday, Wednesday and      Thursday with a one tablet on Tuesday Friday, Saturday, Sunday a.m.      and one half tablet nightly.  7. Zocor 20 mg daily.  8. Senna Plus two tablets nightly.  9. Dulcolax suppository p.r.n..   SOCIAL HISTORY:  She lives in [place], [place], with her  husband and is a retired Agricultural engineer.  She quit tobacco in 1977 with  minimal tobacco history.  She has never abused alcohol or drugs.   FAMILY HISTORY:  Mother died at age 47 with no history of coronary  artery disease.  Her father died at age 3 of an MI.  She  has one sister  who has had coronary artery disease.   REVIEW OF SYSTEMS:  Significant for mild vision loss.  She has seasonal  allergies.  She has had no recent fevers or chills.  She has had some  dizziness in the last couple of days.  She has chronic arthralgias.  She  has tripped and fallen a couple of times and has some sore areas from  that.  She has chronic constipation and reflux symptoms but they are  fairly well controlled.  Review of systems is otherwise negative.   PHYSICAL EXAMINATION:  VITAL SIGNS:  Temperature is 98.1, blood pressure  134/69, pulse 72, respiratory rate 24, O2 saturation 97% on room air.  GENERAL:  She is a well-developed elderly white female in no acute  distress.  HEENT:  Normal.  NECK:  There is no lymphadenopathy, thyromegaly, bruit or JVD noted.  CARDIOVASCULAR:  Her heart is regular in rate and rhythm with an S1 and  S2 and no significant murmur, rub or gallop is noted.  Distal pulses are  intact in all four extremities and no femoral bruits are appreciated.  SKIN:  No rashes are noted.  She has some areas of ecchymosis that are  healing appropriately.  ABDOMEN:  Soft and has active bowel sounds but is slightly tender to  palpation although there is no guarding.  EXTREMITIES:  There is no cyanosis, clubbing or edema noted.  MUSCULOSKELETAL:  There is no joint deformity or effusions and no spine  or CVA tenderness.  NEUROLOGIC:  She is alert and oriented.  Cranial nerves II-XII grossly  intact.   Chest x-ray is pending.   EKG is sinus bradycardia, rate 54 with slight ST depression which is  minimally changed from EKG dated August 2006.   IMPRESSION:  Ms. [name]'s history is concerning for angina, although  physical exam is much more consistent with a gastrointestinal problem.  She will be admitted and myocardial infarction will be ruled out.  We will also check an amylase and lipase.  She will be started on  intravenous nitroglycerin since a  sublingual nitroglycerin improved her  chest symptoms.  If her creatinine is normal, there is a low threshold  to scan her abdomen to rule  out gastrointestinal pathology or aortic  dissection.  She will need a cath if her gastrointestinal workup is  unrevealing or enzymes are positive.  She will be continued on her other  home medications.   Dr. Pierre Bali saw the patient and determined the plan of care.      Rosaria Ferries, PA-C      Shaune Pascal. Bensimhon, MD  Electronically Signed    RB/MEDQ  D:  04/13/2007  T:  04/13/2007  Job:  [id number]   cc:   Dr. Linna Darner

## 2011-04-20 NOTE — Discharge Summary (Signed)
NAMELINDA, VENTEICHER NO.:  [id number]   MEDICAL RECORD NO.:  [id number]          PATIENT TYPE:  INP   LOCATION:  2009                         FACILITY:  [facility]   PHYSICIAN:  Juanda Bond. Burt Knack, MD  DATE OF BIRTH:  [birth year]   DATE OF ADMISSION:  04/13/2007  DATE OF DISCHARGE:  04/14/2007                               DISCHARGE SUMMARY   PRIMARY CARE PHYSICIANS:  Primary cardiologist Dr. Burt Knack.  Primary care  Jermar Colter Dr. Linna Darner.  Primary gastroenterologist is Dr. Henrene Pastor.   PRINCIPAL DIAGNOSIS:  Chest and epigastric pain.   SECONDARY DIAGNOSES:  1. Coronary artery disease, status post coronary artery bypass      grafting times four in 1982 with redo coronary artery bypass      grafting times one in 2002.  2. Hyperlipidemia.  3. Type 2 diabetes mellitus.  4. Gastroesophageal reflux disease.  5. Esophageal stricture.  6. Hypothyroidism.  7. Degenerative joint disease.  8. Diabetic neuropathy.   ALLERGIES:  ALLERGIES TO MULTIPLE PAIN MEDICATIONS, MULTIPLE  ANTIBIOTICS:  REGLAN, PLENDIL, CODEINE, MORPHINE, PERCODAN, SULFA,  PENICILLIN, ULTRAM, HYDROCODONE, AVELOX, TALWIN AND DEMEROL.   PROCEDURE:  None.   HISTORY OF PRESENT ILLNESS:  A 77 year old Caucasian female with  problems with CAD as outlined above.  She was in her usual state of  health until Apr 13, 2007 when shortly after eating breakfast, she  developed upper abdominal and chest discomfort with radiation into her  back associated with mild shortness of breath, nausea and diaphoresis.  She took two sublingual nitroglycerin tablets, which she felt were old,  without much relief and then presented to the [facility] ED.  In the ED,  she did have some relief with nitroglycerin.  Her abdominal exam  revealed a slightly tender epigastric area with reproduction of the  discomfort.  She was placed on observation for rule out.   HOSPITAL COURSE:  The patient ruled out for MI.  She has been ambulating  today without any recurrent discomfort.  The plan is for discharge today  with follow-up outpatient Myoview in our office as well as follow-up  with Dr. Henrene Pastor in gastroenterology.   LABORATORY DATA:  Discharge labs:  Hemoglobin 13.5, hematocrit 39.7, WBC  16.3, platelets 282,000.  PT 13.1, INR 1.0, sodium of 140, potassium of  4.0, chloride 104, CO2 of 25, BUN 7, creatinine 0.59, glucose 124, total  bilirubin 1.1, alkaline phosphatase 74, AST 34, ALT 32, total protein  6.4, albumin 3.7, calcium 9.1, magnesium 2.0, amylase 34, hemoglobin A1C  6.2, lipase 27.  CK 43, MB 1.8, troponin-I is 0.02.  Total cholesterol  186, triglycerides 180, HDL 33, LDL of 117, TSH 0.077.   DISPOSITION:  The patient is being discharged home today in good  condition.   FOLLOW UP APPOINTMENTS:  She has a follow-up adenosine myogram on Apr 27, 2007 at 9:30 AM.  She is asked to follow-up with Dr. Sherren Mocha  on May 02, 2007 at 10:30 AM.  She is asked to follow-up with Dr. Linna Darner  in approximately one week with regards to hyperthyroidism with  low TSH  of 0.077 as we have noted staggered doses in the past.  We have asked  her to follow-up with Dr. Henrene Pastor in approximately two weeks for  epigastric and abdominal discomfort.   DISCHARGE MEDICATIONS:  1. Synthroid as previously prescribed.  2. Aspirin 325 mg daily.  3. Cymbalta 60 mg q.p.m.  4. Nexium 40 mg b.i.d.  5. Xanax 0.5 mg q.h.s.  6. Glimepiride 2 mg as previously prescribed.  7. Zocor 20 mg q.p.m.  8. Senna two tabs q.p.m.  9. Nitrocycline 0.4 mg sublingual p.r.n. chest pain.   Outstanding labs and studies:  None.   Duration of discharge encounter:  40 minutes including physician time.      Murray Hodgkins, ANP      Juanda Bond. Burt Knack, MD  Electronically Signed    CB/MEDQ  D:  04/14/2007  T:  04/14/2007  Job:  [id number]   cc:   Docia Chuck. Henrene Pastor, MD  Posey Boyer, MD

## 2011-04-20 NOTE — Assessment & Plan Note (Signed)
[facility]                         GASTROENTEROLOGY OFFICE NOTE   Janet Steele, Janet Steele                       MRN:          [id number]  DATE:02/21/2008                            DOB:          Jan 24, 1929    HISTORY:  Janet Steele presents today with complaints of worsening reflux.  She  is a 77 year old with hypertension, diabetes, severe coronary artery  disease with coronary artery bypass grafting and repeat coronary artery  bypass grafting, gastroesophageal reflux disease, complicated by  recurrent symptomatic peptic stricturing; hypothyroidism and chronic  constipation.  Patient was last evaluated in the office September 08, 2007,  for reflux.  See that dictation for details.  She has been on Nexium 40  mg b.i.d.  She takes her morning dose before breakfast and her evening  dose before bed.  She was doing well until January, when she felt  problems with nocturnal regurgitation and burning.  This lasted for  about two months and has since resolved.  She thought she would come in  today for a check-up.  She denies odynophagia.  She continues with  intermittent dysphagia to dry foods.  Her last endoscopy was performed  February 16, 2007.  At that time, her distal peptic stricture was injected  with triamcinolone.  She was subsequently dilated with an 18 mm Savary  dilator.  She did have her bed elevated, though does not have it  elevated at present.  Her appetite is good and weight fairly stable.   ALLERGIES:  Include MORPHINE, CODEINE, SULFA, VICODIN, ULTRAM, PLETAL,  PENICILLIN, DEMEROL, REGLAN, MORPHINE AND AVELOX.   CURRENT MEDICATIONS:  Nexium, glimepiride, vitamin C, vitamin D,  metoprolol, levothyroxine, metformin, aspirin, Cymbalta and Xanax.   PHYSICAL EXAM:  Finds a well-appearing female, in no acute distress.  Blood pressure is 120/62, heart rate is 80, weight is 138.4 pounds.  HEENT:  Sclerae anicteric, conjunctivae pink.  LUNGS:  Clear.  HEART:   Regular.  ABDOMEN:  Soft without tenderness, mass or hernia.   IMPRESSION:  1. Gastroesophageal reflux disease.  Recent problems representing      nocturnal flare.  Now resolved.  2. History of peptic stricture, status post triamcinolone injection      and Savary dilation, March of 2008.  Some dysphagia at present.  3. Multiple medical problems.   RECOMMENDATIONS:  1. Continue to take morning dose of Nexium 30 minutes before      breakfast.  However, I would like her to take her second dose of      Nexium 30 minutes before supper, as opposed to before bed.  2. Elevate the head of the bed six inches on bed blocks.  3. Avoid late-night meals.  4. Encouraged to consider repeat endoscopy with esophageal dilation,      as she is having recurrent dysphagia.  5. Antacids p.r.n.  6. Ongoing general medical care with Dr. Linna Darner.     Janet Chuck. Henrene Pastor, MD  Electronically Signed    JNP/MedQ  DD: 02/21/2008  DT: 02/21/2008  Job #: [id number]   cc:   Darrick Penna. Linna Darner, MD,FACP,FCCP

## 2011-04-20 NOTE — Assessment & Plan Note (Signed)
[facility]                            CARDIOLOGY OFFICE NOTE   Janet Steele, Janet Steele                       MRN:          [id number]  DATE:10/09/2007                            DOB:          July 19, 1929    PRIMARY CARDIOLOGIST:  Dr. Sherren Mocha.   PRIMARY CARE Janet Steele:  Dr. Linna Steele.   GI:  Dr. Henrene Steele.   PATIENT PROFILE:  A 77 year old Caucasian female with a prior history of  CAD status post CABG and redo CABG who presents for followup.   PROBLEM LIST:  1. CAD.      a.     Status post CABG in 1992 with redo bypass in 2002.      b.     Apr 27, 2007 adenosine Myoview.  EF 68%.  Previous infarct       on lateral and posterior lateral walls with mild to moderate peri-       infarct ischemia and very mild reversible defect in the mid to       distal anterior wall, which was felt to be shifting breast       attenuation.  There is minimal change from previous done in       October, 2006.  2. Hyperlipidemia.  3. Hypothyroidism.  4. Type 2 diabetes mellitus.  5. GERD.  6. History of peptic stricture requiring multiple esophageal      dilatations.   HISTORY OF PRESENT ILLNESS:  A 77 year old Caucasian female who presents  today for followup.  She was last seen by Dr. Burt Steele in May of this  year.  Since then, she has done well and has tried to remain active  without any chest pain, shortness of breath, or limitations in her usual  activities.  She says that, where she used to walk 2 miles a day, she is  now only walking every 2 to 3 times a week without any symptoms of  limitation, and plans to walk more often.   ALLERGIES:  MORPHINE, CODEINE, SULFA, VICODIN, AVELOX, ULTRAM, PLETAL,  PENICILLIN, DEMEROL, REGLAN, and INDOCIN.   CURRENT MEDICATIONS:  1. Nexium 40 mg daily.  2. Aspirin 325 mg daily.  3. Glimepiride 2 mg q. a.m. 1 mg q. p.m.  4. Vitamin C and D daily.  5. Synthroid 75 mcg daily.  6. Crestor 20 mg daily.  7. Metoprolol 25 mg  nightly.   PHYSICAL EXAM:  Blood pressure 134/72, heart rate 55, respirations 18,  weight 141 pounds, which is up about 4 pounds since her last visit.  [name] in no acute distress.  Alert and oriented x3.  NECK:  No bruits or JVD.  LUNGS:  Regular and unlabored.  Clear to auscultation.  CARDIAC:  Regular S1, S2.  No S3, S4 murmurs.  ABDOMEN:  Round, soft, and nontender, nondistended.  Bowel sounds  present x4.  EXTREMITIES:  Warm and dry, pink.  No cyanosis, clubbing, or edema.  Her  distal pulses are 2+ and equal bilaterally.   ACCESSORY CLINICAL FINDINGS:  EKG shows sinus bradycardia, rate of 55  beats per minute.  She has inferior Q waves as well as some small Q  waves in V4 through V6.  These are all old.  There are no acute ST-T  wave changes.  She had lab work drawn with Dr. Linna Steele on October 7.  Total cholesterol was 185, triglycerides 181, HDL 35.7, LDL 113.  Hemoglobin 14.5, hematocrit 41.9, WBCs 6.4, platelets 266,000,  hemoglobin A1c 6.7.  AST 44, ALT 57, BUN 15, creatinine 0.7, potassium  4.3, TSH 0.03.   ASSESSMENT AND PLAN:  1. Coronary artery disease.  The patient appears to be doing well from      this standpoint and denies any symptoms or limitations.  She      remains on aspirin, beta blocker, and Statin therapy.  2. Hyperlipidemia.  Recent lipid profile in October showed a total      cholesterol 185 with an LDL of 113.  She is on Crestor 20 mg with      mildly elevated AST and ALT, which will require followup.  3. Diabetes mellitus.  She is on an oral agent and followed by Dr.      Linna Steele.  4. Hypothyroidism.  Recent TSH was low, suggesting over-replacement      and her dose of Synthroid was cut in half by Dr. Linna Steele.  5. Gastroesophageal reflux disease, stable on Nexium.   DISPOSITION:  The patient will follow up with Dr. Burt Steele in 3 months or  sooner if necessary.      Murray Hodgkins, ANP  Electronically Signed      Juanda Bond.  Janet Knack, MD  Electronically Signed   CB/MedQ  DD: 10/09/2007  DT: 10/10/2007  Job #: [id number]

## 2011-04-23 NOTE — Discharge Summary (Signed)
[place]  Patient:    Janet Steele, Janet Steele                       MRN: [id number] Adm. Date:  [id number] Disc. Date: [id number] Attending:  Valla Leaver Dictator:   Sheliah Hatch, P.A. CCVanna Scotland Olevia Perches, M.D. [facility]  Darrick Penna. Linna Darner, M.D. [facility]   Discharge Summary  DATE OF BIRTH:  22-Sep-1929  SURGEON:  Gaye Pollack, M.D.  CARDIOLOGISTVanna Scotland Olevia Perches, M.D.  PRIMARY CARE PHYSICIAN:  Darrick Penna. Linna Darner, M.D.  ADMISSION DIAGNOSIS:  Severe native three-vessel coronary artery disease with occlusion of previous vein grafts.  DISCHARGE DIAGNOSIS:  Severe native three-vessel coronary artery disease with occlusion of previous vein grafts.  PREVIOUS MEDICAL HISTORY:  1. Coronary artery disease with CABG x 4 in 1982.  2. Recent abnormal Cardiolite on January 13, 2000.  3. Recent cardiac catheterization on February 16, 2001.  4. Hypertension.  5. Hypercholesterolemia.  6. Myocardial infarction x 2 in 1977 and 1982.  7. Degenerative joint disease and back pain.  8. Hypothyroidism.  9. GERD. 10. Smoking.  PROCEDURES:  Redo CABG x 1 on February 24, 2001 with LIMA to LAD.  BRIEF HISTORY:  [name] is a 77 year old white female who was referred to Dr. Cyndia Bent by Dr. Velora Heckler for evaluation of progression of her coronary artery disease.  She is status post CABG in 99 in [place], [place].  She has done well from a cardiac standpoint.  However, during a recent preoperative evaluation for back surgery a cardiogram showed abnormal ST changes.  She was subsequently catheterized which showed restenosis of the vein grafts.  Dr. Cyndia Bent evaluated the patient and the films and recommended redo CABG.  The risks, benefits, details, and alternatives were discussed and it was agreed to proceed.  HOSPITAL COURSE:  She came into the hospital for the elective procedure on February 24, 2001.  There were no complications.  She was transferred to SICU  in stable condition.  Postoperatively, she enjoyed a benign course with no complications.  Her Lopressor was discontinued secondary to bradycardia, asymptomatic.  She was transferred to unit 2000 where she made good progress working with cardiac rehab.  By February 28, 2001 she was doing very well.  She had no complaints.  Her vital signs were stable.  She was afebrile.  Wounds were healing well.  She was still a little volume-overloaded at 9 pounds over her preoperative weight.  She was deemed suitable and stable for discharge home and was subsequently discharged.  MEDICATIONS: 1. Lasix 40 mg one p.o. q.d. x 5 days. 2. KCl 20 mEq one p.o. q.d. x 5 days. 3. Ultram 50 mg 1-2 p.o. q.4-6h. p.r.n. for pain. 4. Enteric-coated aspirin 325 mg one p.o. q.d.  She is to resume her home medicines.  These included: 1. Synthroid 0.125 mg one p.o. q.d. 2. Lipitor 20 mg one p.o. q.d. 3. Altace 5 mg one p.o. q.d. 4. Vioxx 50 mg one p.o. q.d. 5. Fosamax. 6. Nexium. 7. Premarin 0.165 mg one p.o. q.d.  ALLERGIES:  CODEINE, MORPHINE, ______, PERCODAN, SULFA, PENICILLIN, and DEMEROL are listed as allergies.  SPECIAL INSTRUCTIONS:  ACTIVITY:  Ms. Parchment is told to do no driving, no lifting more than 10 pounds. She is told to walk daily and use her incentive spirometer daily.  WOUND CARE:  She is told to watch her wounds  for increasing redness, swelling, drainage, or fever, and to call the office if she notices anything unusual. She is told to clean her wounds gently daily with mild soap and water.  CONDITION:  Stable.  FOLLOW-UP: 1. She was told to call Dr. Olevia Perches to arrange a two-week appointment, at which time she was told to get a chest x-ray. 2. She was to see Dr. Cyndia Bent on Tuesday, March 21, 2001 at 10:30 a.m. DD:  03/13/01 TD:  03/13/01 Job: [id number]

## 2011-04-23 NOTE — Assessment & Plan Note (Signed)
[place]                           GASTROENTEROLOGY OFFICE NOTE   Janet Steele, Janet Steele                       MRN:          [id number]  DATE:06/14/2006                            DOB:          [birth year]    REFERRED BY:  The patient is self referred.   REASON FOR CONSULTATION:  Dysphagia and cough.   HISTORY:  This is a 77 year old female with a history of gastroesophageal  reflux disease complicated by erosive esophagitis, peptic stricture and food  impaction as well as chronic constipation and chronic back pain.  She also  has significant cardiac history and has had prior myocardial infarction as  well as redo coronary bypass grafting.  Other medical problems include  hyperlipidemia and diabetes.  I have not seen the patient since January  2005.  Her last upper endoscopy which was performed with esophageal  dilatation occurred in 1999.  She presents now with complaints of coughing x1 month as well, intermittent  solid food dysphagia with the occasional need to regurgitate food as well as  regurgitation without swallowing.  She has also had a sensation of  swallowing liquids and coughing or choking.  She states that she found a Z-  Pak at home which she took over the 4th of July and thinks this may have  helped her symptoms some.  She should be maintained on twice a day Nexium  though is maintained on Nexium about 4 times per week.  She states that  despite this, she has no heartburn or indigestion.  She has had no bleeding  or weight loss.  Her other general medical problems including her diabetes  have been stable by her report.   CURRENT MEDICATIONS:  1.  Nexium 40 mg about four times per week.  2.  Glimeprice 2 mg in the morning and 1 mg at night.  3.  Cymbalta 60 mg at night.  4.  Vytorin 10/40 mg daily.  5.  Allergy injections once weekly.  6.  Levothyroxine 150 mcg alternating with 75 mcg daily.  7.  Vitamin C.  8.  Vitamin D.  9.   Xanax p.r.n.   ALLERGIES OR DRUG INTOLERANCES:  MORPHINE, CODEINE, SULFA, VICODIN, AVALOX,  ULTRAM, PLENDIL, PENICILLIN and DEMEROL.   PHYSICAL EXAMINATION:  GENERAL:  Elderly female in no acute distress.  She  is alert and oriented.  She does seem to have an involuntarily associated  head movement.  VITAL SIGNS:  Blood pressure is 110/66, heart rate 70, weight is 127 pounds.  HEENT:  Sclerae are anicteric.  Conjunctivae are pink.  Oral mucosa is  intact.  No erythema at the posterior pharynx.  No thrush.  LUNGS:  The lungs are clear to auscultation and percussion.  HEART:  The heart is regular.  ABDOMEN:  The abdomen is soft and nontender with good bowel sounds.  No  organomegaly, mass or hernia.  EXTREMITIES:  The extremities are without edema.   IMPRESSION:  Gastroesophageal reflux disease.  Currently experiencing  problems with regurgitation and cough.  Also, recurrent solid food  dysphagia, likely due to peptic  stricture.   RECOMMENDATIONS:  1.  Increase Nexium to once daily (twice daily would be optimal though she      cannot afford twice daily therapy).  2.  Reflux precautions.  3.  Schedule upper endoscopy with esophageal dilatation.  The nature of the      procedure as well as the risks, benefits, and alternatives have been      reviewed.  She  understood and agreed to proceed.                                   Docia Chuck. Geri Seminole., MD   JNP/MedQ  DD:  06/14/2006  DT:  06/14/2006  Job #:  [id number]   cc:   Darrick Penna. Linna Darner, MD, FCCP  Signa Kell, MD, [place] Lucia Gaskins, MD

## 2011-04-23 NOTE — Assessment & Plan Note (Signed)
[place]                            CARDIOLOGY OFFICE NOTE   ANGLINA, GRAHAN                       MRN:          [id number]  DATE:03/28/2007                            DOB:          [birth year]    Mrs. Hanauer is a very pleasant 77 year old white married female with long  history of coronary artery disease and previous DMI.   She had a CABG in 1983, by Dr. Bronson Ing in [place] and re-do in  2002, by Dr. Gilford Raid. Her Myoview in October 2006, revealed mild  defect inferior wall with possible reversibility without change from two  years previously. The EF was 55%.   The patient also has a history of diabetes, hyperlipidemia.   She had lost, down to 122 a year, but now back up to 139.   Her LDL on simvastatin 80 is 109. She had been on Vytorin 10/80, but we  changed this to simvastatin 80.   The patient is having no cardiac symptoms.   MEDICATIONS:  1. Nexium 40.  2. Glimepiride 2 mg q a.m., 1 at h.s.  3. Cymbalta 60.  4. Allergy shots.  5. Levothyroxine 75 mg Thursday, Saturday and Sunday and 150 on      Tuesday, Wednesday and Friday.  6. Aspirin 362.  7. Boniva IV every three months.  8. Simvastatin 80.   The patient has had normal abdominal ultrasound in the past. Normal LV.   Blood pressure is 142/86, pulse 65 normal sinus rhythm.  GENERAL APPEARANCE: Normal. JVP is not elevated. Carotid pulse palpable  and equal without bruits.  LUNGS:  Clear.  CARDIAC:  No murmur or gallop. No  rub.  ABDOMEN: Liver, spleen, kidneys not palpable. No masses.  EXTREMITIES: Normal.   EKG: Reveals normal sinus rhythm of old inferior myocardial infarction  and unchanged.   DIAGNOSES:  As listed above.   I have suggested she increase her exercise and lose weight. Will recheck  her BMP today, her lipids and LFTs in three months and she will see my  associated Dr. Burt Knack for followup.     Signa Kell, MD, [facility]  Electronically  Signed    EJL/MedQ  DD: 03/28/2007  DT: 03/28/2007  Job #: [id number]

## 2011-04-23 NOTE — Op Note (Signed)
[place]  Patient:    NAZARET, ARRELLANO                       MRN: [id number] Proc. Date: 02/24/01 Adm. Date:  [id number] Attending:  Valla Leaver CC:         Vanna Scotland. Olevia Perches, M.D. [facility]  [facility]   Operative Report  PREOPERATIVE DIAGNOSIS:  Severe native three vessel coronary artery disease and high grade diagonal and left anterior descending vein graft stenosis, status post coronary bypass surgery in 1982.  POSTOPERATIVE DIAGNOSIS:  Severe native three vessel coronary artery disease and high grade diagonal and left anterior descending vein graft stenosis, status post coronary bypass surgery in 1982.  OPERATIVE PROCEDURE:  Redo median sternotomy, extracorporeal circulation, coronary artery bypass graft surgery x 1 using a left internal mammary artery graft to the left anterior descending coronary artery.  SURGEON:  Gaye Pollack, M.D.  ASSISTANT:  Nicole Kindred, P.A.-C.  ANESTHESIA:  General endotracheal.  CLINICAL HISTORY:  The patient is a 77 year old woman who underwent coronary artery bypass x 4 in 1982 in [place].  She had a sequential saphenous vein graft to the diagonal and LAD, a saphenous vein graft to the obtuse marginal, and saphenous vein graft to the distal right coronary artery or posterior descending branch.  She had done well and denies any symptoms at this time. She underwent a stress Cardiolite exam with Persantine on January 13, 2000, for cardiac evaluation prior to undergoing back surgery.  This showed mild inferolateral ischemia.  The study was clinically and electrically negative for ischemia.  Her ejection fraction was 67%.  She underwent cardiac catheterization on February 16, 2001, which showed severe native three vessel disease with occlusion of the proximal LAD, obtuse marginal, and right coronary artery.  The sequential saphenous vein graft to the diagonal and LAD had 30% and 40%  proximal stenosis, and then a severe 90% mid graft stenosis. The vein segment between the diagonal and LAD appeared normal.  The vein graft to the obtuse marginal was occluded.  There was no visualization of the obtuse marginal itself.  The vein graft to the right coronary artery appeared normal. It appeared to insert into the posterior descending branch.  There was a 90% stenosis between the posterior descending and a small posterolateral branch. The left ventricular ejection fraction was 55% with inferior hypokinesis. After review of the angiograms and examination of the patient, it was felt that redo coronary bypass surgery was the best treatment for this patient.  I told her that I thought the LAD and diagonal were certainly graftable arteries, but the obtuse marginal and posterolateral branch were small, and probably not graftable.  I discussed the operative procedure of coronary artery bypass graft surgery with her including alternatives, benefits, and risks including bleeding, possible blood transfusion, infection, stroke, myocardial infarction, and death.  She understood and agreed to proceed.  DESCRIPTION OF PROCEDURE:  The patient was taken to the operating room and placed on the table in the supine position.  After induction of general endotracheal anesthesia, a Foley catheter was placed in the bladder using sterile technique.  Then, the chest, abdomen, and both lower extremities were prepped and draped in the usual sterile manner.  The chest was reentered through the previous median sternotomy incision.  The pericardium had not been closed with the prior surgery, and the anterior surface of the right ventricle was adhesed to the  sternum.  Both sternal edges were retracted and the heart dissected off the back of the sternum using electrocautery.  Then, the left internal mammary artery was harvested from the chest wall as a pedicle graft.  This was a medium caliber vessel with  excellent blood flow through it.  Then, dissection was performed to expose the right atrium and ascending aorta. The previous vein grafts were identified and carefully preserved.  When the patient had been heparinized and when an adequate accurate declotting time was achieved, the distal ascending aorta was cannulated using a 20-French aortic cannula for arterial inflow.  Venous outflow was achieved using a two-stage venous cannula through the right atrial appendage.  An antegrade cardioplegia and vent cannula was inserted in the aortic root.  Attention was placed to cardiopulmonary bypass and the remainder of the heart was dissected from the pericardium.  The distal coronaries were identified. The LAD was a medium size graftable vessel with no visible distal disease in it.  The diagonal branch was likewise a medium size vessel with no distal disease in it.  The vein graft to the LAD was heavily diseased proximally, but the segment between the diagonal and the LAD was soft, purplish in color, and had no visible disease in it.  The obtuse marginal branch of the left circumflex coronary artery was a small diffusely diseased vessel with no visible lumen.  This was not graftable.  The posterior descending branch of the right coronary artery was a very small vessel that had no visible disease in it, but was certainly not a graftable size.  The vein graft to the right coronary artery was completely free of disease and was soft.  Then, the aorta was cross-clamped and 600 cc of cold blood antegrade cardioplegia was administered in the aortic root with quick arrest of the heart.  Systemic hypothermia at 20 degrees centigrade and topical hypothermia with ice saline was used.  A temperature probe was placed in the septum and an inflating pad in the pericardium.  Then, the distal anastomosis was performed.  The internal diameter of the LAD just beyond the previous anastomosis was about 2 mm.  The  conduit used was the  left internal mammary artery and this was brought through an opening made in the left pericardium anterior to the phrenic nerve.  It was anastomosed to the LAD in an end-to-side manner using continuous 8-0 Prolene suture.  The pedicle was tacked to the epicardium with 6-0 Prolene suture.  The patient was rewarmed to 37 degrees centigrade.  The clamp was removed from the mammary and pedicle.  There was rapid rewarming of the ventricular septum and return of spontaneous ventricular fibrillation.  The cross-clamp was removed with a time of 18 minutes.  The patient spontaneously converted to sinus rhythm.  Two temporary right ventricular and right atrial pacing wires were placed and brought out through the skin.  When the patient had rewarmed to 37 degrees centigrade, she was weaned from cardiopulmonary bypass on no inotropic agents.  Total bypass time was 54 minutes.  Cardiac function appeared excellent with a cardiac output of 6 L per minute.  Protamine was given and the venous and aortic cannula removed without difficulty.  Hemostasis was achieved.  Aprotinin was given for this case.  Four chest tubes were placed with bilateral pleural tubes, a tube in the posterior pericardium, and one in the anterior mediastinum.  There was no pericardium to close over the heart.  The sternum was closed with #  6 stainless steel wires.  The fascia was closed with a continuous #1 Vicryl suture.  The subcutaneous tissue was closed using continuous 2-0 Vicryl and the skin with 3-0 Vicryl subcuticular closure.  The lower extremity vein harvest site was closed in layers in a similar manner.  The sponge, needle, and instrument counts were correct according to the scrub nurse.  A dry sterile dressing was applied over the incision and around the chest tubes which were hooked to Pleuravac suction.  The patient remained hemodynamically stable and transported to the SICU in guarded, but  stable condition. DD:  02/24/01 TD:  02/26/01 Job: [id number]

## 2011-04-23 NOTE — Assessment & Plan Note (Signed)
[place]                                 ON-CALL NOTE   CYLEIGH, CIONI                       MRN:          [id number]  DATE:01/07/2007                            DOB:          June 09, 1929    PRIMARY GASTROENTEROLOGIST:  Docia Chuck. Henrene Pastor, M.D.   HISTORY:  Ms. Knodel called tonight at around 9:15.  She states that she  has had trouble with what sounds like bowel obstructions in the past as  well as esophageal food impaction.  She feels like her colon is twisted  and that she may have something also caught her esophagus.  She is  maintaining her secretions just fine.  Her  liquids go down.  She does  not sound distended to me, but I think to be safe, she should go the to  emergency room for evaluation.     Milus Banister, MD  Electronically Signed    DPJ/MedQ  DD: 01/07/2007  DT: 01/07/2007  Job #: [id number]   cc:   Docia Chuck. Henrene Pastor, MD

## 2011-04-23 NOTE — Assessment & Plan Note (Signed)
[place] OFFICE NOTE   NAME:NUTTFrancile, Pekala                       MRN:          [id number]  DATE:11/01/2006                            DOB:          06-21-1929    The patient is a 77 year old white married female with a long history of  coronary artery disease with previous DMI.  She had a CABG in 1983 with  a redo in 2002.  A Myoview in October 2006 revealed mild defect in  inferior wall with possible reversibility without change from two years  previously.  EF was 55%.  The patient also has history of diabetes and  hyperlipidemia.  She is having no cardiac symptoms and feels quite well,  though she is concerned that she gained approximately 20 pounds over the  summer.  Since May on our scale is 13 pounds.   MEDICATIONS:  1. Nexium 40.  2. Glimepiride 1 mg daily.  3. Cymbalta 60.  4. Vytorin 10/40.  5. Levothyroxine.  6. Aspirin.  7. Boniva which she takes IV every three months at [facility].   I should also note that she has had a chronic cough which she thinks is  somewhat improved since she has saw Dr. Henrene Pastor.   PHYSICAL EXAM:  Blood pressure 122/68, pulse 73 in normal sinus rhythm.  GENERAL APPEARANCE:  Normal.  JVP is dilated.  Carotid pulses are  palpable and equal without bruits.  LUNGS:  Clear.  CARDIAC:  Normal.  No murmur, gallop or rub.  ABDOMEN:  Normal.  Liver, spleen and kidney not palpated.  EXTREMITIES:  Normal with no edema.   EKG reveals normal sinus rhythm with old inferior MI and nonspecific ST  changes.  This is unchanged from prior EKG. She had not had an abdominal  ultrasound in the past.  We plan to perform this in the near future.  I  will see her back in six months.  She should have a BmP, lipids and  LFTs.     Signa Kell, MD, [facility]  Electronically Signed    EJL/MedQ  DD: 11/01/2006  DT: 11/01/2006  Job #: [id number]

## 2011-04-23 NOTE — Cardiovascular Report (Signed)
[place]. [facility]  Patient:    Janet Steele, Janet Steele                      MRN: [id number] Proc. Date: 02/16/01 Attending:  Vanna Steele. Janet Steele, M.D. [facility] CC:         Janet Steele, M.D. [facility]  Cardiopulmonary Lab  Janet Steele, M.D. [facility]   Cardiac Catheterization  CLINICAL HISTORY:  Janet Steele is 77 years old and had bypass surgery in [place] in 1982.  She has done well since that time.  She was scheduled for a back surgery in [place], and had a screening Cardiolite scan done prior to that, which showed anterior and anterolateral wall ischemia.  The ejection fraction was 67%.  She was seen by Janet Steele and scheduled for evaluation with catheterization and her back surgery was postponed.  PROCEDURAL NOTE:  The procedure was performed by the right femoral artery using arterial sheath and 6-French preformed coronary catheters.  A front wall arterial puncture was performed and Omnipaque contrast was used.  A JR4 catheter was used for injection of the vein graft to the circumflex and LAD. A right bypass catheter was used for injection of the vein graft to the right coronary artery.  A distal aortogram was performed to rule out abdominal aortic aneurysm.  The patient tolerated the procedure well and left the laboratory in satisfactory condition.  HEMODYNAMIC RESULTS: 1. Aortic pressure:  132/58 with mean 85. 2. Left ventricular pressure:  132/14.  ANGIOGRAPHIC RESULTS: 1. LEFT MAIN CORONARY ARTERY:  Free of significant disease. 2. LEFT ANTERIOR DESCENDING CORONARY:  Completely occluded after two    septal perforators. 3. CIRCUMFLEX CORONARY ARTERY:  Gave rise to an intermediate branch,    an AV branch and a marginal branch.  The marginal branch was    totally occluded. 4. RIGHT CORONARY ARTERY:  Totally occluded proximally. 5. GRAFTS:    a. The saphenous vein graft to the posterior descending branch of       the right coronary artery was patent and  functioned normally.    b. The vein graft also filled two posterolateral branches through       the posterior descending branch; but, there was a 90% stenosis       right at the ostium of the posterior descending branch with the       AV branch.    c. The saphenous vein graft to the diagonal branch of the LAD and the       LAD were patent; but there was a 90% stenosis at its mid portion.    d. There was a 40% left stenosis in the proximal to mid portion, with       an ulceration.  There was also irregularity in the proximal vein       graft with some irregularity and approximately 30% narrowing.    e. The saphenous vein graft to the marginal branch was completely       occluded near its origin, as compared to old.  LEFT VENTRICULOGRAPHY: Performed in the RAO projection, showed hypokinesis of the inferobasilar wall to the mid inferior wall.  The estimated ejection fraction was 55%.  SUBCLAVIAN INJECTION:  Showed patent subclavian artery and no internal mammary or vertebral stenosis.  DISTAL AORTOGRAPHY:  Showed patent renal arteries and no major aortoiliac obstruction; although the distal aorta was quite irregular.  CONCLUSIONS:  Coronary artery disease, status post coronary bypass graft surgery  in 1982, with: a. Total occlusion of a native LAD, right coronary artery and circumflex    marginal vessels. b. Patent vein graft to the posterior descending branch of the right coronary    artery, with 90% stenosis in a posterolateral branch. c. 90% stenosis in the vein graft to the diagonal branch of the LAD. d. Total occlusion of the vein graft to the marginal branch of the circumflex    artery, with inferior wall hypokinesis.  RECOMMENDATIONS:  Options include percutaneous intervention of the vein graft to the diagonal and LAD versus redo bypass surgery.  I reviewed these with Janet Steele.  Since she is 10 and otherwise in good health, I would favor a redo bypass.  Although we could  treat the saphenous vein graft with percutaneous intervention, the chances of progression of disease over the next few years is very high. DD:  02/16/01 TD:  02/16/01 Job: [id number]

## 2011-04-23 NOTE — Discharge Summary (Signed)
[place]  Patient:    Janet Steele, Janet Steele                      MRN: [id number] Adm. Date:  02/24/01 Attending:  Gaye Pollack, M.D. Dictator:   Sherrilyn Rist, P.A.-C. CC:         Signa Kell, M.D. [facility]  Darrick Penna. Linna Darner, M.D. [facility]   Discharge Summary  DATE OF BIRTH:  June 19, 1929  CHIEF COMPLAINT:  Coronary artery disease.  HISTORY OF PRESENT ILLNESS:  Janet Steele is a pleasant 77 year old white female referred by Dr. Velora Heckler for evaluation of three-vessel coronary artery disease, recurrent.  The patient is status post CABG in 85 in [place], [place].  Overall, the patient was doing well from a cardiac standpoint. However, during a recent preoperative evaluation for back surgery, an EKG showed abnormal ST changes.  Cardiac catheterization revealed restenosis of the obtuse marginal vein graft and high-grade restenosis of the LAD and diagonal vein grafts.  Dr. Cyndia Bent evaluated the patient and films and recommended redo CABG for management despite the patient being asymptomatic at this point.  No shortness of breath, dyspnea on exertion, or paroxysmal nocturnal dyspnea at this point.  No cough.  No sputum production or anginal symptoms.  No radiation to the arm or the jaw of any pain.  No nausea or vomiting.  No diaphoresis.  No fever or chills.  No symptoms of TIA, CVA, or amaurosis fugax.  PAST MEDICAL HISTORY: 1. CAD. 2. Hypertension. 3. Hypercholesterolemia. 4. Ejection fraction 67%. 5. Status post MI in 62 and 1982. 6. DJD, chronic lower back pain. 7. Hypothyroidism. 8. GERD symptoms.  PAST SURGICAL HISTORY:  Status post cardiac catheterization, Dr. Olevia Perches in 2002.  Status post CABG in 1982 in [place], [place].  Status post cholecystectomy.  Status post hysterectomy.  MEDICATIONS: 1. Synthroid 0.125 mg q.d. 2. Lipitor 20 mg q.d. 3. Altace 10 mg q.d. 4. Nexium 20 mg q.a.m. 5. Calcium, unknown dose q.d. 6.  Vitamin E and C, unknown dose q.d. 7. Vioxx 50 mg q.d. 8. Premarin 0.625 mg q.d.  ALLERGIES:  CODEINE, MORPHINE, TALWIN, PERANCON, PENICILLIN, SULFA, DEMEROL, HYDROCODONE, APAP.  REVIEW OF SYSTEMS:  See HPI and Past Medical History for significant positives.  FAMILY HISTORY:  Mother died at 61 of heart disease.  Sister, brother, and father died of heart disease.  SOCIAL HISTORY:  Married for 55 years, three children, one daughter in [place].  The patient works part time in [employer].  She quit tobacco in 1976.  She used to smoke one and one-half pack per day for about 30 years.  She drinks one glass of wine every night.  PHYSICAL EXAMINATION:  GENERAL:  A well-developed, well-nourished, 77 year old white female in no acute distress, alert and oriented x 3.  HEENT:  Normocephalic, atraumatic.  PERRLA. EOMI.  Left cataracts, no glaucoma or macular degeneration.  NECK:  Supple.  No JVD.  Left soft bruit.  No lymphadenopathy.  CHEST:  Symmetrical on inspiration.  Lungs clear to auscultation.  CARDIOVASCULAR:  Regular rate and rhythm without murmur.  There is a well-healed sternotomy site.  No rubs or gallops.  ABDOMEN:  Soft, nontender.  Bowel sounds present x 4.  No masses or bruits.  GU/RECTAL:  Deferred.  EXTREMITIES:  No clubbing, cyanosis, or edema.  SKIN:  No ulcerations.  There is a well-healed venectomy site on the left.  PERIPHERAL PULSES:  Carotid 2+ bilaterally.  Femoral, popliteal, dorsalis pedis, and posterior tibialis 2+ bilaterally.  NEUROLOGIC:  Nonfocal.  DTRs 2+ bilaterally.  Muscle strength 5/5.  Gait steady.  ASSESSMENT/PLAN:  A 77 year old white female with three-vessel coronary artery disease for redo coronary artery bypass graft on February 24, 2001, at [facility].  Dr. Cyndia Bent has seen and evaluated this patient prior to admission and has explained the risks and benefits involving the procedure, and the patient has agreed to  continue. DD:  02/22/01 TD:  02/22/01 Job: [id number]

## 2011-05-07 ENCOUNTER — Telehealth: Payer: Self-pay | Admitting: Internal Medicine

## 2011-05-07 MED ORDER — DULOXETINE HCL 60 MG PO CPEP: 60.0000 mg | ORAL_CAPSULE | ORAL | Status: DC | PRN

## 2011-05-07 NOTE — Telephone Encounter (Signed)
MED SENT IN 

## 2011-05-10 ENCOUNTER — Telehealth: Payer: Self-pay | Admitting: Internal Medicine

## 2011-05-10 MED ORDER — DULOXETINE HCL 60 MG PO CPEP: 60.0000 mg | ORAL_CAPSULE | ORAL | Status: DC | PRN

## 2011-05-10 NOTE — Telephone Encounter (Signed)
Went to wrong pharm.

## 2011-05-21 ENCOUNTER — Other Ambulatory Visit: Payer: Self-pay | Admitting: Neurological Surgery

## 2011-05-21 DIAGNOSIS — M545 Low back pain: Secondary | ICD-10-CM

## 2011-05-31 ENCOUNTER — Other Ambulatory Visit: Payer: Medicare Other

## 2011-06-10 ENCOUNTER — Other Ambulatory Visit: Payer: Self-pay | Admitting: Internal Medicine

## 2011-06-10 MED ORDER — DULOXETINE HCL 60 MG PO CPEP: 60.0000 mg | ORAL_CAPSULE | ORAL | Status: DC | PRN

## 2011-06-10 NOTE — Telephone Encounter (Signed)
RX sent to pharmacy  

## 2011-06-12 ENCOUNTER — Encounter: Payer: Self-pay | Admitting: Internal Medicine

## 2011-06-14 ENCOUNTER — Ambulatory Visit
Admission: RE | Admit: 2011-06-14 | Discharge: 2011-06-14 | Disposition: A | Discharge status: Home / Self Care | Payer: Medicare Other | Source: Ambulatory Visit | Attending: Neurological Surgery | Admitting: Neurological Surgery

## 2011-06-14 DIAGNOSIS — M545 Low back pain: Secondary | ICD-10-CM

## 2011-06-14 MED ORDER — METHYLPREDNISOLONE ACETATE 40 MG/ML IJ SUSP
120.0000 mg | Freq: Once | INTRAMUSCULAR | Status: AC
Start: 2011-06-14 — End: 2011-06-14
  Administered 2011-06-14: 120 mg via INTRA_ARTICULAR

## 2011-06-14 MED ORDER — METHYLPREDNISOLONE ACETATE 40 MG/ML IJ SUSP: 120.0000 mg | Freq: Once | INTRAMUSCULAR | Status: DC

## 2011-06-17 ENCOUNTER — Encounter: Payer: Self-pay | Admitting: Internal Medicine

## 2011-06-17 ENCOUNTER — Ambulatory Visit: Payer: Medicare Other | Admitting: Internal Medicine

## 2011-06-18 ENCOUNTER — Encounter: Payer: Self-pay | Admitting: Internal Medicine

## 2011-06-18 ENCOUNTER — Ambulatory Visit (INDEPENDENT_AMBULATORY_CARE_PROVIDER_SITE_OTHER): Payer: Medicare Other | Admitting: Internal Medicine

## 2011-06-18 VITALS — BP 110/70 | HR 60 | Wt 130.0 lb

## 2011-06-18 DIAGNOSIS — E781 Pure hyperglyceridemia: Secondary | ICD-10-CM

## 2011-06-18 DIAGNOSIS — E119 Type 2 diabetes mellitus without complications: Secondary | ICD-10-CM

## 2011-06-18 LAB — LIPID PANEL
Cholesterol: 166 mg/dL (ref 0–200)
HDL: 51 mg/dL (ref >39.00)
LDL Cholesterol: 90 mg/dL (ref 0–99)
Total CHOL/HDL Ratio: 3
Triglycerides: 127 mg/dL (ref 0.0–149.0)

## 2011-06-18 LAB — HEPATIC FUNCTION PANEL
ALT: 32 U/L (ref 0–35)
AST: 31 U/L (ref 0–37)
Bilirubin, Direct: 0.1 mg/dL (ref 0.0–0.3)
Total Bilirubin: 1.1 mg/dL (ref 0.3–1.2)
Total Protein: 7.4 g/dL (ref 6.0–8.3)

## 2011-06-18 NOTE — Progress Notes (Signed)
  Subjective:    Patient ID: Janet Steele, female    DOB: 02-20-1929, 77 y.o.   MRN: [id number]  HPI Her diabetes has been well controlled; she has seen Dr. Chalmers Cater. FBS  has been as low as 83 and as high as 137. She was asymptomatic with a value of 83.  Serial labs dating to 2/25/ 2011 to 02/29/ 2012 reveals slight progression of liver function test elevation. In February this year her AST was 72 and ALT 67. The respective values were 17 and 15 in February 2011.  Medications were reviewed; there is no specific medication which should cause this. She denies excess vitamin A, Tylenol, or alcohol. She went one glass of wine per day.    Review of Systems     Objective:   Physical Exam General appearance is one of good health and nourishment w/o distress.  Eyes: No conjunctival inflammation or scleral icterus is present.  Heart:  Slow  rate and regular rhythm. S1 and S2 normal without gallop, murmur, click, rub or other extra sounds     Lungs:Chest clear to auscultation; no wheezes, rhonchi,rales ,or rubs present.No increased work of breathing.   Abdomen: bowel sounds normal, soft and non-tender without masses, organomegaly or hernias noted.  No guarding or rebound   Skin:Warm & dry.  Intact without suspicious lesions or rashes ; no jaundice   Vasc: decreased DPP w/o ischemic changes           Assessment & Plan:   #1 mild elevation of liver function tests, progressive 2011 to  2012  #2 diabetes, controlled sounds excellent  Plan: Check hepatic function

## 2011-06-18 NOTE — Progress Notes (Signed)
  Subjective:    Patient ID: Janet Steele, female    DOB: [birth year], 77 y.o.   MRN: [id number]  HPI The initial entry concerning liver function tests elevation  was in error There is only one set of liver function tests and they were normal. She is on a significant dose of statin and her lipids and hepatic function have not been checked since March of 2011 according to the EMR data     Review of Systems     Objective:   Physical Exam        Assessment & Plan:

## 2011-06-18 NOTE — Patient Instructions (Addendum)
The most common cause of elevated triglycerides is the ingestion of sugar from high fructose corn syrup sources. You should consume less than 40 grams  of sugar per day from foods and drinks with high fructose corn syrup as number1, 2, 3, or #4 on the label. As TG go up, HDL or good cholesterol goes down. Also uric acid which causes gout will go up.

## 2011-06-18 NOTE — Progress Notes (Signed)
Addended byUnice Cobble F on: 06/18/2011 12:58 PM   Modules accepted: Orders

## 2011-06-18 NOTE — Progress Notes (Signed)
  Subjective:    Patient ID: Janet Steele, female    DOB: [birth year], 77 y.o.   MRN: [id number]  HPI    Review of Systems     Objective:   Physical Exam        Assessment & Plan:

## 2011-06-18 NOTE — Progress Notes (Signed)
  Subjective:    Patient ID: Janet Steele, female    DOB: November 11, 1929, 77 y.o.   MRN: [id number]  HPI Serial labs dating back to 02/24/2000 were reviewed. Liver function tests were actually normal. AST was 17 and ALT was 16. Triglycerides were 206 and March of 2011.  Her glucoses have ranged from 83-137. She was asymptomatic with the value of 83. She is being monitored by Dr.Balan. Her LDL was 98.6. A1c was 6.5% and June 2011 and 6.7% in March of 2012.    Review of Systems     Objective:   Physical Exam        Assessment & Plan:  Her lipids will be checked. If triglycerides are  elevated; but this will be made on the hyperintense course her sugars. The liver function tests will be evaluated.

## 2011-06-23 ENCOUNTER — Telehealth: Payer: Self-pay

## 2011-06-23 NOTE — Telephone Encounter (Signed)
I left message for with Janet Steele (Mrs.Bhat was unavailable) to discuss with his wife: Per Dr.Hopper we received order for back brace from [facility] and Dr.Hopper would like to know who ordered this ie- Neuro or Orthro (we would need to send this order to whom ever order back brace)

## 2011-06-24 NOTE — Telephone Encounter (Signed)
Spoke with patient and she indicated that she was on the phone with Janeece Riggers about her diabetes supplies and they asked her if she has any back problems and she responded yes(patient gets epidural injections 3 x yearly)  and they said they would send an order to the Doctor. Patient stated she does not need the back brace and to void the request for it .  (paper request shredded)

## 2011-06-28 ENCOUNTER — Ambulatory Visit (INDEPENDENT_AMBULATORY_CARE_PROVIDER_SITE_OTHER): Payer: Medicare Other | Admitting: Internal Medicine

## 2011-06-28 ENCOUNTER — Encounter: Payer: Self-pay | Admitting: Internal Medicine

## 2011-06-28 VITALS — BP 122/84 | HR 54 | Temp 97.7°F | Wt 129.0 lb

## 2011-06-28 DIAGNOSIS — J209 Acute bronchitis, unspecified: Secondary | ICD-10-CM

## 2011-06-28 MED ORDER — AZITHROMYCIN 250 MG PO TABS: ORAL_TABLET | ORAL | Status: AC

## 2011-06-28 MED ORDER — BENZONATATE 100 MG PO CAPS: 100.0000 mg | ORAL_CAPSULE | Freq: Three times a day (TID) | ORAL | Status: DC | PRN

## 2011-06-28 NOTE — Patient Instructions (Signed)
Mucinex DM for cough &  for thick secretions ;force NON dairy fluids for next 48 hrs. Use a Neti pot daily as needed for sinus congestion . Verify whether you can take  Tessalon  Perles for cough.

## 2011-06-28 NOTE — Progress Notes (Signed)
  Subjective:    Patient ID: Janet Steele, female    DOB: October 30, 1929, 77 y.o.   MRN: [id number]  HPI Cough Onset:7/18 as productive cough Extrinsic symptoms:itchy eyes, sneezing:no Infectious symptoms :fever, purulent secretions :yellow from throat & chest Chest symptoms: pain,  hemoptysis,dyspnea,wheezing:no GI symptoms: Dyspepsia, reflux:no Occupational/environmental exposures:no Smoking:quit 1977 ACE inhibitor:no Treatment/efficacy: Neti pot, Mucinex DM Past medical history/family history pulmonary disease: no    Review of Systems She denies nasal congestion/obstruction; nasal purulence; facial pain; anosmia; fatigue; fever; headache; halitosis; earache and dental pain.    Objective:   Physical Exam General appearance: well nourished; no acute distress or increased work of breathing is present.  No  lymphadenopathy about the head, neck, or axilla noted.   Eyes: No conjunctival inflammation or lid edema is present. There is no scleral icterus but sclerae slightly injected.  Ears:  External ear exam shows no significant lesions or deformities.  Otoscopic examination reveals clear canals, tympanic membranes are intact bilaterally without bulging, retraction, inflammation or discharge.Minor TM scarring  Nose:  External nasal examination shows no deformity or inflammation. Nasal mucosa are pink and moist without lesions or exudates. No septal dislocation or dislocation.No obstruction to airflow.   Oral exam: Oral hygiene is good; lips and gums are healthy appearing.There is no oropharyngeal erythema or exudate noted. Upper plate ; lower partial  Neck:  No deformities, thyromegaly, masses, or tenderness noted.    Heart:  Normal rate and regular rhythm. S1 and S2 normal without gallop, murmur, click, rub or other extra sounds. S4  Lungs:Chest clear to auscultation; no wheezes, rhonchi,rales ,or rubs present.No increased work of breathing.    Extremities:  No cyanosis, edema. Minor   clubbing  noted    Skin: Warm & dry w/o jaundice or tenting.         Assessment & Plan:  #1 bronchitis, acute. No history of suggest associated rhinosinusitis.  Plan: See orders

## 2011-08-25 ENCOUNTER — Telehealth: Payer: Self-pay | Admitting: Cardiovascular Disease

## 2011-08-25 DIAGNOSIS — E7800 Pure hypercholesterolemia, unspecified: Secondary | ICD-10-CM

## 2011-08-25 NOTE — Telephone Encounter (Signed)
Pt is on crestor and has reached donut hole and was wondering if we had samples or something cheaper she can take because it is $80

## 2011-08-26 NOTE — Telephone Encounter (Signed)
I spoke with the pt and she cannot afford Crestor ($83/month).  The pt said she has taken lipitor in the past without problems.  I instructed the pt to contact her pharmacy to find out how much atorvastatin would cost.  I will call the pt back to discuss cost of medication.

## 2011-08-27 MED ORDER — ATORVASTATIN CALCIUM 80 MG PO TABS: 80.0000 mg | ORAL_TABLET | Freq: Every day | ORAL | Status: DC

## 2011-08-27 NOTE — Telephone Encounter (Signed)
I called the pt's home and spoke with her husband.  The pt will be home after 12 Noon.

## 2011-08-27 NOTE — Telephone Encounter (Signed)
I spoke with the pt and made her aware of Dr Antionette Char recommendation to switch to Atorvastatin 80mg  daily.  Rx called into [facility].

## 2011-08-27 NOTE — Telephone Encounter (Signed)
The pt said that the pharmacy cannot tell her the cost of Atorvastatin.  The pt would like either Rx for Simvastatin or Atrovastatin called in per Dr Antionette Char recommendation. I will forward this information to Dr Burt Knack for review.

## 2011-08-30 ENCOUNTER — Encounter: Payer: Medicare Other | Admitting: Internal Medicine

## 2011-09-03 LAB — GLUCOSE, CAPILLARY: Glucose-Capillary: 128 — ABNORMAL HIGH

## 2011-09-09 ENCOUNTER — Ambulatory Visit (INDEPENDENT_AMBULATORY_CARE_PROVIDER_SITE_OTHER): Payer: Medicare Other | Admitting: Internal Medicine

## 2011-09-09 DIAGNOSIS — K449 Diaphragmatic hernia without obstruction or gangrene: Secondary | ICD-10-CM

## 2011-09-09 DIAGNOSIS — R1013 Epigastric pain: Secondary | ICD-10-CM

## 2011-09-09 DIAGNOSIS — R112 Nausea with vomiting, unspecified: Secondary | ICD-10-CM

## 2011-09-09 LAB — CBC WITH DIFFERENTIAL/PLATELET
Basophils Relative: 0.2 % (ref 0.0–3.0)
Eosinophils Absolute: 0.2 10*3/uL (ref 0.0–0.7)
HCT: 43 % (ref 36.0–46.0)
Hemoglobin: 14.4 g/dL (ref 12.0–15.0)
Lymphocytes Relative: 30.9 % (ref 12.0–46.0)
Lymphs Abs: 2 10*3/uL (ref 0.7–4.0)
MCHC: 33.6 g/dL (ref 30.0–36.0)
Monocytes Relative: 7.6 % (ref 3.0–12.0)
Neutro Abs: 3.9 10*3/uL (ref 1.4–7.7)
RBC: 4.76 Mil/uL (ref 3.87–5.11)

## 2011-09-09 LAB — HEPATIC FUNCTION PANEL
AST: 26 U/L (ref 0–37)
Albumin: 4.4 g/dL (ref 3.5–5.2)
Alkaline Phosphatase: 77 U/L (ref 39–117)
Total Protein: 7.2 g/dL (ref 6.0–8.3)

## 2011-09-09 LAB — AMYLASE: Amylase: 43 U/L (ref 27–131)

## 2011-09-09 NOTE — Patient Instructions (Addendum)
The triggers for nausea or "yucky"  include stress; the "aspirin family" ; alcohol; peppermint; and caffeine (coffee, tea, cola, and chocolate). The aspirin family would include aspirin and the nonsteroidal agents such as ibuprofen &  Naproxen. Tylenol would not cause reflux. If having symptoms ; food & drink should be avoided for @ least 2 hours before going to bed.   Take the omeprazole 30 minutes before breakfast and 30 minutes before the evening meal. If symptoms fail to resolve; Dr. Henrene Pastor should be seen.

## 2011-09-09 NOTE — Progress Notes (Signed)
  Subjective:    Patient ID: Janet Steele, female    DOB: Nov 11, 1929, 77 y.o.   MRN: [id number]  HPI ABDOMINAL PAIN: Location: epigastrium  Onset: 10 days ago   Radiation: no  Severity: up to 8 Quality: "yucky" & cramping  Duration: up to hrs  Better with: none  Worse with: water intake Symptoms Nausea/Vomiting: yes, vomited X 4  Diarrhea: no, loose stool  9 days ago Constipation: no  Melena/BRBPR: no  Hematemesis: no  Anorexia: yes,  Fever/Chills: no, but "hot & cold"  Dysuria/hematuria/pyuria: no  Wt loss: yes, 2#  EtOH use: no  NSAIDs/ASA: Aleve intermittently for headache  Past Surgeries: upper endoscopy: hiatal hernia; Dr Henrene Pastor. Note: off Dexilant "for months". She's been taking omeprazole as needed with some benefit       Review of Systems     Objective:   Physical Exam General appearance is one of good health and nourishment w/o distress.  Eyes: No conjunctival inflammation or scleral icterus is present.  Oral exam: Upper denture/ lower partial; lips and gums are healthy appearing.There is no oropharyngeal erythema or exudate noted.   Heart:  Normal rate and regular rhythm. S1 and S2 normal without gallop, murmur, click, rub.S4 with slurring    Lungs:Chest clear to auscultation; no wheezes, rhonchi,rales ,or rubs present.No increased work of breathing.   Abdomen: bowel sounds normal, soft  But tender in epigastrium  without masses, organomegaly or hernias noted.  No guarding or rebound   Skin:Warm & dry.  Intact without suspicious lesions or rashes ; no jaundice or tenting. Plethora of fingers  Lymphatic: No lymphadenopathy is noted about the head, neck, axilla  areas.              Assessment & Plan:  #1 epigastric pain in the context of past history of hernia  #2 nausea and vomiting  Plan: See orders and recommendations

## 2011-09-13 ENCOUNTER — Other Ambulatory Visit: Payer: Self-pay | Admitting: Internal Medicine

## 2011-09-13 MED ORDER — LEVOTHYROXINE SODIUM 150 MCG PO TABS: ORAL_TABLET | ORAL | Status: DC

## 2011-09-13 NOTE — Telephone Encounter (Signed)
RX sent, patient needs to have TSH 244.9

## 2011-09-20 ENCOUNTER — Telehealth: Payer: Self-pay | Admitting: Internal Medicine

## 2011-09-20 LAB — COMPREHENSIVE METABOLIC PANEL
ALT: 34
AST: 26
Alkaline Phosphatase: 82
CO2: 28
Calcium: 9.7
Chloride: 106
GFR calc Af Amer: >60
GFR calc non Af Amer: >60
Glucose, Bld: 92
Potassium: 4.1
Sodium: 140
Total Bilirubin: 1.1

## 2011-09-20 LAB — TSH: TSH: 0.055 — ABNORMAL LOW

## 2011-09-20 LAB — BASIC METABOLIC PANEL
Calcium: 9.6
GFR calc Af Amer: >60
GFR calc non Af Amer: >60
Potassium: 4.5
Sodium: 139

## 2011-09-20 LAB — CBC
Hemoglobin: 13.8
RBC: 4.51
WBC: 6.5

## 2011-09-20 LAB — VITAMIN B12: Vitamin B-12: 379 (ref 211–911)

## 2011-09-20 NOTE — Telephone Encounter (Signed)
Pt aware of appt date and time

## 2011-09-20 NOTE — Telephone Encounter (Signed)
Left message for pt to call back. Pt scheduled to see Amy Esterwood PA 09/23/11@3 :30pm.

## 2011-09-20 NOTE — Telephone Encounter (Signed)
Have her see midlevel this Thursday (let her know that I am supervising and I can see her after midlevel)

## 2011-09-20 NOTE — Telephone Encounter (Signed)
Pt last seen 12/28/10 OV for gerd, dysphagia. EGD done 01/05/11. Pt states that she saw Dr. Linna Darner last week and that she feels everything that she eats is getting stuck where her hiatal hernia is. Pt states Dr. Linna Darner also told her she may have gastritis and that she needs to see Dr. Henrene Pastor. Pt states she thinks there is something "bad wrong with her." Requesting to be seen. Offered pt an appt with the midlevel and pt states "No I have to see Dr. Henrene Pastor." Let her know there were no appts available in the near future and that Dr. Henrene Pastor would probably want her to see a midlevel and she refused. Dr. Henrene Pastor please advise.

## 2011-09-23 ENCOUNTER — Ambulatory Visit (INDEPENDENT_AMBULATORY_CARE_PROVIDER_SITE_OTHER): Payer: Medicare Other | Admitting: Physician Assistant

## 2011-09-23 ENCOUNTER — Encounter: Payer: Self-pay | Admitting: Physician Assistant

## 2011-09-23 DIAGNOSIS — K219 Gastro-esophageal reflux disease without esophagitis: Secondary | ICD-10-CM

## 2011-09-23 DIAGNOSIS — K5904 Chronic idiopathic constipation: Secondary | ICD-10-CM

## 2011-09-23 DIAGNOSIS — R1310 Dysphagia, unspecified: Secondary | ICD-10-CM

## 2011-09-23 DIAGNOSIS — K5909 Other constipation: Secondary | ICD-10-CM

## 2011-09-23 NOTE — Patient Instructions (Signed)
We have given you samples of Aciphex to take, 1 tab 30 min before breakfast.   We have scheduled the procedure with Dr. Scarlette Shorts for 09-28-2011. Directions provided.

## 2011-09-23 NOTE — Progress Notes (Signed)
Subjective:    Patient ID: Janet Steele, female    DOB: May 05, 1929, 77 y.o.   MRN: [id number]  HPI Janet Steele is a very nice 77 year old white female well-known to Dr. Henrene Pastor with history of chronic GERD, and esophageal stricture. She also has diverticular disease and chronic constipation. Her last endoscopy was done in January of 2012 for dysphagia and she underwent balloon dilation 18 through 20 mm. She says her swallowing improved for a couple of months and then gradually began having difficulty again. Over the past 2-3 weeks she has been having more significant problems and says basically she's been eating only liquids and thick liquids. She had an episode of odynophagia as well and was started on a trial of Prilosec per Dr. Linna Darner I believe. However she reports that the Prilosec got stuck and she had to regurgitate it and has not taken any since. At this point she is not complaining of odynophagia. Her appetite has been fair she does feel that she is lost a few pounds. She had also been having some abdominal discomfort and distention which she thinks was secondary to constipation. She says she was very bloated but after taking senna and Gas-X she has had good bowel movements and feels better.  Recent labs with CBC and hepatic panel were both normal.    Review of Systems  Constitutional: Negative.   HENT: Positive for trouble swallowing.   Eyes: Negative.   Respiratory: Negative.   Cardiovascular: Negative.   Gastrointestinal: Positive for abdominal pain, constipation and abdominal distention.  Genitourinary: Negative.   Musculoskeletal: Negative.   Skin: Negative.   Neurological: Negative.   Hematological: Negative.   Psychiatric/Behavioral: Negative.    Outpatient Prescriptions Prior to Visit  Medication Sig Dispense Refill  . aspirin 325 MG EC tablet Take 325 mg by mouth daily.        Marland Kitchen atorvastatin (LIPITOR) 80 MG tablet Take 1 tablet (80 mg total) by mouth daily.  90 tablet  3  .  benzonatate (TESSALON PERLES) 100 MG capsule Take 1 capsule (100 mg total) by mouth 3 (three) times daily as needed for cough.  15 capsule  0  . Cholecalciferol (VITAMIN D) 1000 UNITS capsule Take 1,000 Units by mouth daily.        . DULoxetine (CYMBALTA) 60 MG capsule Take 1 capsule (60 mg total) by mouth as needed.  30 capsule  2  . glimepiride (AMARYL) 2 MG tablet TAKE ONE TABLET BY MOUTH EVERY MORNING AND TAKE ONE-HALF TABLET BY MOUTH EVERY EVENING  45 tablet  3  . glucose blood (FREESTYLE LITE) test strip 1 each by Other route as needed. Use as instructed       . levothyroxine (SYNTHROID, LEVOTHROID) 150 MCG tablet 1/2 tablet daily  90 tablet  0  . metFORMIN (GLUCOPHAGE) 500 MG tablet TAKE ONE TABLET BY MOUTH EVERY DAY  90 tablet  3  . nitroGLYCERIN (NITROSTAT) 0.4 MG SL tablet Place 0.4 mg under the tongue every 5 (five) minutes as needed.        . prochlorperazine (COMPAZINE) 10 MG tablet Take 10 mg by mouth every 6 (six) hours as needed.        . traZODone (DESYREL) 50 MG tablet Take 50 mg by mouth 3 (three) times daily.       . zoledronic acid (RECLAST) 5 MG/100ML SOLN Inject 5 mg into the vein once.        Marland Kitchen dexlansoprazole (DEXILANT) 60 MG capsule Take 60 mg by  mouth daily.         Allergies  Allergen Reactions  . Codeine     REACTION: rash  . Hydrocodone-Acetaminophen     REACTION: unspecified  . Meperidine Hcl     REACTION: itching  . Metformin     REACTION: nervous  . Metoclopramide Hcl     REACTION: unspecified  . Metoprolol Succinate     REACTION: nervous  . Morphine     REACTION: hives  . Moxifloxacin     REACTION: itching  . Oxycodone-Aspirin     REACTION: rash  . Penicillins     REACTION: itching/rash  . Pentazocine Lactate     REACTION: ? nausea  . Pioglitazone     REACTION: bloating  . Sulfonamide Derivatives     REACTION: itching, rash  . Tramadol Hcl     REACTION: unspecified       Objective:   Physical Exam Well-developed elderly white female in  no acute distress, pleasant. She has involuntary twitching movements HEENT; nontraumatic normocephalic EOMI PERRLA sclera anicteric,Neck; Supple no JVD, Cardiovascular; regular rate and rhythm with S1-S2 occasional ectopic Pulmonary; clear bilaterally, Abdomen; soft, nontender, has multiple incisional scars, bowel sounds active, no palpable mass or hepatosplenomegaly. Rectal; not done, skin warm dry benign without lesion, Psych; mood and affect normal and appropriate        Assessment & Plan:  #26 77 year old female with recurrent dysphagia likely secondary to recurrent distal esophageal stricture. Patient has also had some odynophagia consistent with esophagitis.  Plan; Will give her   a trial of AcipHex 20 mg by mouth daily, samples given. This is a very small tablet which should not be difficult for her to swallow. Schedule for upper endoscopy with Dr. Henrene Pastor with repeat balloon dilation.  #2 chronic constipation/diverticulosis/IBS Continue supportive management with Gas-X and laxatives as needed.

## 2011-09-23 NOTE — Progress Notes (Signed)
Patient seen along with Amy Esterwood. Agree with plans for upper endoscopy with balloon dilation of the esophagus. She needs propofol sedation

## 2011-09-28 ENCOUNTER — Ambulatory Visit (AMBULATORY_SURGERY_CENTER): Payer: Medicare Other | Admitting: Internal Medicine

## 2011-09-28 ENCOUNTER — Encounter: Payer: Self-pay | Admitting: Internal Medicine

## 2011-09-28 VITALS — BP 108/58 | HR 53 | Temp 98.0°F | Resp 15 | Ht 61.0 in | Wt 130.0 lb

## 2011-09-28 DIAGNOSIS — K219 Gastro-esophageal reflux disease without esophagitis: Secondary | ICD-10-CM

## 2011-09-28 DIAGNOSIS — K222 Esophageal obstruction: Secondary | ICD-10-CM

## 2011-09-28 MED ORDER — SODIUM CHLORIDE 0.9 % IV SOLN: 500.0000 mL | INTRAVENOUS | Status: DC

## 2011-09-28 NOTE — Patient Instructions (Signed)
Discharge instructions given with verbal understanding.  Handouts on stricture,hiatal hernia,gerd, and a dilatation diet given.  Resume previous medications.

## 2011-09-29 ENCOUNTER — Telehealth: Payer: Self-pay | Admitting: *Deleted

## 2011-09-29 NOTE — Telephone Encounter (Signed)

## 2011-10-11 ENCOUNTER — Emergency Department (HOSPITAL_COMMUNITY)
Admission: EM | Admit: 2011-10-11 | Discharge: 2011-10-11 | Disposition: A | Discharge status: Home / Self Care | Payer: Medicare Other | Source: Home / Self Care

## 2011-10-11 NOTE — ED Notes (Signed)
Registration staff [facility] called stating that patient's husband picked her up and she has chosen to receive care at another facility.  [place] states patient walked to car of her own power.

## 2011-10-11 NOTE — ED Notes (Signed)
Called to the waiting area to evaluate this pt. Same is c/o "not feeling good. I have chills sometimes. I feel like I could throw up sometimes too. I know I've got the flu." Pt. COA x 4, speaks in lengthy multiple word sentences, denies CP & SOB. Pt. verbally begging me "please take me back.Marland KitchenMarland KitchenI don't want to wait that long." I explained to the pt that at this time I could not validate taking her in front of other people that have been here longer. Pt provided with a wheelchair, offered a blanket (denied same). Instructed the pt to inform registration staff if her condition changed. She verbalized her understanding of said instructions.

## 2011-10-20 ENCOUNTER — Other Ambulatory Visit: Payer: Self-pay | Admitting: Internal Medicine

## 2011-10-20 MED ORDER — DULOXETINE HCL 60 MG PO CPEP: 60.0000 mg | ORAL_CAPSULE | ORAL | Status: DC | PRN

## 2011-10-20 NOTE — Telephone Encounter (Signed)
RX sent

## 2011-11-03 ENCOUNTER — Other Ambulatory Visit: Payer: Self-pay | Admitting: Neurological Surgery

## 2011-11-03 DIAGNOSIS — M549 Dorsalgia, unspecified: Secondary | ICD-10-CM

## 2011-11-10 ENCOUNTER — Other Ambulatory Visit: Payer: Medicare Other

## 2011-11-11 ENCOUNTER — Encounter: Payer: Self-pay | Admitting: Internal Medicine

## 2011-11-11 ENCOUNTER — Ambulatory Visit (INDEPENDENT_AMBULATORY_CARE_PROVIDER_SITE_OTHER): Payer: Medicare Other | Admitting: Internal Medicine

## 2011-11-11 VITALS — BP 124/80 | HR 68 | Temp 98.2°F | Wt 127.0 lb

## 2011-11-11 DIAGNOSIS — R4182 Altered mental status, unspecified: Secondary | ICD-10-CM

## 2011-11-11 DIAGNOSIS — R5383 Other fatigue: Secondary | ICD-10-CM

## 2011-11-11 DIAGNOSIS — G454 Transient global amnesia: Secondary | ICD-10-CM

## 2011-11-11 DIAGNOSIS — D6489 Other specified anemias: Secondary | ICD-10-CM

## 2011-11-11 DIAGNOSIS — E039 Hypothyroidism, unspecified: Secondary | ICD-10-CM

## 2011-11-11 DIAGNOSIS — G589 Mononeuropathy, unspecified: Secondary | ICD-10-CM

## 2011-11-11 DIAGNOSIS — G629 Polyneuropathy, unspecified: Secondary | ICD-10-CM

## 2011-11-11 DIAGNOSIS — R5381 Other malaise: Secondary | ICD-10-CM

## 2011-11-11 DIAGNOSIS — I10 Essential (primary) hypertension: Secondary | ICD-10-CM

## 2011-11-11 NOTE — Progress Notes (Signed)
  Subjective:    Patient ID: Janet Steele, female    DOB: 1929/11/01, 77 y.o.   MRN: [id number]  HPI Memory Loss  :   Onset: 12/5 she awoke approximately 9:30 in the morning. She stated that her husband thought that she was" confused  & looked terrible". He instructed her to go back to bed which she did. She slept until 11:30 AM. Upon awakening  she had no memory of the events earlier that morning. She had no loss of consciousness, syncope, or seizure stigmata. She awoke approximately 1:00 this morning and remember that she had not gone to a funeral 12/5. She still does not remember the events described to her by her husband. No cardiac prodrome of heart racing, heart irregularity, or palpitations. She has had a frontal headache the evening before that the events. This resolved with 2 Bufferin. She denied symptoms of numbness & tingling; weakness; Vertigo;gait dysfunction/falling ; tremor; or urine / stool incontinence.    Review of Systems no  Diaphoresis, chest pain,  hemoptysis , or dyspnea .       Objective:   Physical Exam    Gen. appearance: Well-nourished, in no distress Eyes: Extraocular motion intact, field of vision normal, vision grossly intact, no nystagmus ENT: Canals clear, tympanic membranes dull w/o lesions, tuning fork exam normal, hearing grossly decreased bilaterally Neck: Normal range of motion, no masses, normal thyroid Cardiovascular: Rate and rhythm normal; no murmurs, gallops .S 4 Muscle skeletal:  tone, &  strength normal Neuro:no cranial nerve deficit, deep tendon  reflexes normal, gait normal except decreased arm swing Lymph: No cervical or axillary LA Skin: Warm and dry without suspicious lesions or rashes Psych: no anxiety or mood change. Normally interactive and cooperative.   Many mental state examination: 26/30        Assessment & Plan:  #1 transitory global amnesia  #2 past medical history of movement disorders for which she has seen Dr. Erling Cruz  #3  hypertension, controlled  #4 hypothyroidism  Plan: See orders recommendations.  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 Nonspecific ST-T wave changes. EKG appears stable compared to 12/10/10.

## 2011-11-11 NOTE — Patient Instructions (Signed)
.  Share results with Dr Erling Cruz

## 2011-11-12 LAB — BASIC METABOLIC PANEL
BUN: 17 mg/dL (ref 6–23)
CO2: 23 mEq/L (ref 19–32)
Glucose, Bld: 124 mg/dL — ABNORMAL HIGH (ref 70–99)
Potassium: 4.1 mEq/L (ref 3.5–5.1)
Sodium: 138 mEq/L (ref 135–145)

## 2011-12-01 ENCOUNTER — Ambulatory Visit
Admission: RE | Admit: 2011-12-01 | Discharge: 2011-12-01 | Disposition: A | Discharge status: Home / Self Care | Payer: Medicare Other | Source: Ambulatory Visit | Attending: Neurological Surgery | Admitting: Neurological Surgery

## 2011-12-01 DIAGNOSIS — M549 Dorsalgia, unspecified: Secondary | ICD-10-CM

## 2011-12-01 MED ORDER — METHYLPREDNISOLONE ACETATE 40 MG/ML INJ SUSP (RADIOLOG
120.0000 mg | Freq: Once | INTRAMUSCULAR | Status: AC
  Administered 2011-12-01: 120 mg via INTRA_ARTICULAR

## 2011-12-01 MED ORDER — IOHEXOL 180 MG/ML  SOLN
1.0000 mL | Freq: Once | INTRAMUSCULAR | Status: AC | PRN
  Administered 2011-12-01: 1 mL via INTRA_ARTICULAR

## 2011-12-01 NOTE — Progress Notes (Signed)
Ambulated w/ only minimal assistance.  Gait steady.  Reviewed discharge instructions w/ patient.  Pt states that she understands.  1440  Dr Maree Erie here to see pt.  1450  Pt discharged to home.  Husband here to drive.

## 2011-12-17 ENCOUNTER — Encounter: Payer: Self-pay | Admitting: Internal Medicine

## 2011-12-17 ENCOUNTER — Ambulatory Visit (INDEPENDENT_AMBULATORY_CARE_PROVIDER_SITE_OTHER): Payer: Medicare Other | Admitting: Internal Medicine

## 2011-12-17 VITALS — BP 128/72 | HR 64 | Ht 61.0 in | Wt 130.2 lb

## 2011-12-17 DIAGNOSIS — K219 Gastro-esophageal reflux disease without esophagitis: Secondary | ICD-10-CM

## 2011-12-17 DIAGNOSIS — R1310 Dysphagia, unspecified: Secondary | ICD-10-CM | POA: Diagnosis not present

## 2011-12-17 DIAGNOSIS — K222 Esophageal obstruction: Secondary | ICD-10-CM

## 2011-12-17 MED ORDER — OMEPRAZOLE MAGNESIUM 20 MG PO TBEC: 20.0000 mg | DELAYED_RELEASE_TABLET | Freq: Every day | ORAL | Status: DC

## 2011-12-17 NOTE — Patient Instructions (Signed)
You have been given some samples of Prilosec.  Follow up as needed

## 2011-12-17 NOTE — Progress Notes (Signed)
HISTORY OF PRESENT ILLNESS:  Janet Steele is a 78 y.o. female with multiple significant medical problems who is followed in the esophagus principally for GERD complicated by peptic stricture and chronic dysphagia due to stricture and dysmotility. She was last seen 09/28/2011 when she underwent upper endoscopy with balloon dilation of the esophagus to a maximum diameter of 20 mm. This for dysphagia. Shortly after that, she discontinued her proton pump inhibitor therapy for unclear reasons. About 7 weeks ago she began to have severe heartburn and indigestion. About 4 weeks ago this progressed to include severe chest discomfort. She had omeprazole at home and took 40 mg. The discomfort resolved in about 12 hours. Thereafter, she continued on omeprazole 20 mg daily with no recurrence of symptoms until yesterday when she noticed heartburn after eating chocolate. She denies significant recurrent dysphagia. "Choking" with water.  REVIEW OF SYSTEMS:  All non-GI ROS negative.  Past Medical History  Diagnosis Date  . Thyroid disease   . Hyperlipidemia   . GERD (gastroesophageal reflux disease)   . Anxiety   . Diabetes mellitus   . Hypertension   . PVD (peripheral vascular disease)   . Carotid bruit   . Other dysphagia   . Other esophagitis   . Hemorrhoid   . Other constipation   . Colon, diverticulosis   . Hiatal hernia   . Osteoarthrosis, unspecified whether generalized or localized, unspecified site   . Degenerative joint disease   . Esophageal stricture   . Unspecified hearing loss     Past Surgical History  Procedure Date  . Appendectomy   . Cholecystectomy   . Abdominal hysterectomy   . Oophorectomy     bilateral  . Coronary artery bypass graft     x4(1982),02-2002 CABG X2  . Back surgery 01-2000    Social History YONINA BASTION  reports that she has quit smoking. She has never used smokeless tobacco. She reports that she does not drink alcohol or use illicit drugs.  family  history includes Heart attack in her father and Heart disease in her sister.  There is no history of Colon cancer.  Allergies  Allergen Reactions  . Codeine     REACTION: rash  . Hydrocodone-Acetaminophen     REACTION: unspecified  . Meperidine Hcl     REACTION: itching  . Metformin     REACTION: nervous  . Metoclopramide Hcl     REACTION: unspecified  . Metoprolol Succinate     REACTION: nervous  . Morphine     REACTION: hives  . Moxifloxacin     REACTION: itching  . Oxycodone-Aspirin     REACTION: rash  . Penicillins     REACTION: itching/rash  . Pentazocine Lactate     REACTION: ? nausea  . Pioglitazone     REACTION: bloating  . Sulfonamide Derivatives     REACTION: itching, rash  . Tramadol Hcl     REACTION: unspecified       PHYSICAL EXAMINATION: Vital signs: BP 128/72  Pulse 64  Ht 5\' 1"  (1.549 m)  Wt 130 lb 3.2 oz (59.058 kg)  BMI 24.60 kg/m2  SpO2 95% General: Well-developed, well-nourished, no acute distress HEENT: Sclerae are anicteric, conjunctiva pink. Oral mucosa intact Lungs: Clear Heart: Regular Abdomen: soft, nontender, nondistended, no obvious ascites, no peritoneal signs, normal bowel sounds. No organomegaly. Extremities: No edema Psychiatric: alert and oriented x3. Cooperative    ASSESSMENT:  #1. Chronic GERD. Recent exacerbation symptoms after coming off PPI therapy #  2. History of peptic stricture #3. Esophageal dysmotility    PLAN:  #1. Reflux precautions #2. Omeprazole 20 mg daily. Multiple samples have been provided. She prefers purchasing omeprazole OTC. I reminded her, that she should take this daily. She may take the medication twice a day for refractory symptoms or breakthrough symptoms. #3. GI followup when necessary

## 2011-12-23 ENCOUNTER — Encounter: Payer: Self-pay | Admitting: Cardiovascular Disease

## 2011-12-23 ENCOUNTER — Ambulatory Visit (INDEPENDENT_AMBULATORY_CARE_PROVIDER_SITE_OTHER): Payer: Medicare Other | Admitting: Cardiovascular Disease

## 2011-12-23 DIAGNOSIS — E782 Mixed hyperlipidemia: Secondary | ICD-10-CM

## 2011-12-23 DIAGNOSIS — I25810 Atherosclerosis of coronary artery bypass graft(s) without angina pectoris: Secondary | ICD-10-CM | POA: Diagnosis not present

## 2011-12-23 DIAGNOSIS — R55 Syncope and collapse: Secondary | ICD-10-CM | POA: Diagnosis not present

## 2011-12-23 NOTE — Progress Notes (Signed)
HPI:  78 year old woman presenting for followup evaluation. The patient has coronary artery disease status post coronary bypass surgery in 1982 and redo CABG in 2002.  Her last nuclear scan in 2011 showed a gated left ventricular ejection fraction of 63% with normal wall motion. There was suggestion of mixed ischemia and infarction in the basal to mid anterolateral and inferolateral walls. There was no change from the last study done in 2006.  The patient complains of dizziness with positional changes. This is been progressive over the last few months. She has not had frank syncope but she feels very lightheaded when she stands up. She drinks plenty of water by her report. She does not salt her food. She denies chest pain or pressure. She denies dyspnea, orthopnea, PND, or edema.  Outpatient Encounter Prescriptions as of 12/23/2011  Medication Sig Dispense Refill  . aspirin 325 MG EC tablet Take 325 mg by mouth daily.        Marland Kitchen atorvastatin (LIPITOR) 80 MG tablet Take 1 tablet (80 mg total) by mouth daily.  90 tablet  3  . DULoxetine (CYMBALTA) 60 MG capsule Take 1 capsule (60 mg total) by mouth as needed.  30 capsule  6  . glimepiride (AMARYL) 2 MG tablet TAKE ONE TABLET BY MOUTH EVERY MORNING AND TAKE ONE-HALF TABLET BY MOUTH EVERY EVENING  45 tablet  3  . glucose blood (FREESTYLE LITE) test strip 1 each by Other route as needed. Use as instructed       . levothyroxine (SYNTHROID, LEVOTHROID) 150 MCG tablet 1/2 tablet daily  90 tablet  0  . metFORMIN (GLUCOPHAGE) 500 MG tablet TAKE ONE TABLET BY MOUTH EVERY DAY  90 tablet  3  . nitroGLYCERIN (NITROSTAT) 0.4 MG SL tablet Place 0.4 mg under the tongue every 5 (five) minutes as needed.        Marland Kitchen omeprazole (PRILOSEC OTC) 20 MG tablet Take 1 tablet (20 mg total) by mouth daily.  28 tablet  1  . traZODone (DESYREL) 50 MG tablet Take 50 mg by mouth 3 (three) times daily.       Marland Kitchen DISCONTD: prochlorperazine (COMPAZINE) 10 MG tablet Take 10 mg by mouth every 6  (six) hours as needed.        Marland Kitchen DISCONTD: CRESTOR 40 MG tablet       . DISCONTD: zoledronic acid (RECLAST) 5 MG/100ML SOLN Inject 5 mg into the vein once.          Allergies  Allergen Reactions  . Codeine     REACTION: rash  . Hydrocodone-Acetaminophen     REACTION: unspecified  . Meperidine Hcl     REACTION: itching  . Metformin     REACTION: nervous  . Metoclopramide Hcl     REACTION: unspecified  . Metoprolol Succinate     REACTION: nervous  . Morphine     REACTION: hives  . Moxifloxacin     REACTION: itching  . Oxycodone-Aspirin     REACTION: rash  . Penicillins     REACTION: itching/rash  . Pentazocine Lactate     REACTION: ? nausea  . Pioglitazone     REACTION: bloating  . Sulfonamide Derivatives     REACTION: itching, rash  . Tramadol Hcl     REACTION: unspecified    Past Medical History  Diagnosis Date  . Thyroid disease   . Hyperlipidemia   . GERD (gastroesophageal reflux disease)   . Anxiety   . Diabetes mellitus   . Hypertension   .  PVD (peripheral vascular disease)   . Carotid bruit   . Other dysphagia   . Other esophagitis   . Hemorrhoid   . Other constipation   . Colon, diverticulosis   . Hiatal hernia   . Osteoarthrosis, unspecified whether generalized or localized, unspecified site   . Degenerative joint disease   . Esophageal stricture   . Unspecified hearing loss     ROS: Negative except as per HPI  BP 126/84  Pulse 64  Ht 5\' 1"  (1.549 m)  Wt 59.476 kg (131 lb 1.9 oz)  BMI 24.78 kg/m2  PHYSICAL EXAM: Pt is alert and oriented, NAD HEENT: normal Neck: JVP - normal, carotids 2+= without bruits Lungs: CTA bilaterally CV: RRR with grade 2/6 systolic murmur at the right upper sternal border Abd: soft, NT, Positive BS, no hepatomegaly Ext: no C/C/E, distal pulses intact and equal Skin: warm/dry no rash  EKG:  November 11, 2011: Normal sinus rhythm with nonspecific IVCD, age indeterminate inferior infarct.  ASSESSMENT AND  PLAN:

## 2011-12-23 NOTE — Patient Instructions (Signed)
Your physician wants you to follow-up in: 6 MONTHS. You will receive a reminder letter in the mail two months in advance. If you don't receive a letter, please call our office to schedule the follow-up appointment.  Your physician has requested that you have a carotid duplex. This test is an ultrasound of the carotid arteries in your neck. It looks at blood flow through these arteries that supply the brain with blood. Allow one hour for this exam. There are no restrictions or special instructions.  Your physician has requested that you have an echocardiogram. Echocardiography is a painless test that uses sound waves to create images of your heart. It provides your doctor with information about the size and shape of your heart and how well your heart's chambers and valves are working. This procedure takes approximately one hour. There are no restrictions for this procedure.  Your physician recommends that you continue on your current medications as directed. Please refer to the Current Medication list given to you today.

## 2011-12-23 NOTE — Assessment & Plan Note (Signed)
Lipids reviewed. The patient is tolerating high-dose atorvastatin. Will continue with the same.

## 2011-12-23 NOTE — Assessment & Plan Note (Signed)
The patient is stable without angina. She will continue on her current medical program.

## 2011-12-23 NOTE — Assessment & Plan Note (Signed)
The patient is on no antihypertensive medication. She may be developing mild autonomic dysfunction. Considering her cardiac murmur, have recommended a 2-D echocardiogram to rule out valvular disease or outflow tract obstruction. Also recommended a carotid ultrasound to rule out hemodynamically significant carotid disease.  She was advised to continue to drink plenty of fluids and had a small amount of salt to her foods. We discussed the importance of caution with positional changes.

## 2012-01-05 ENCOUNTER — Ambulatory Visit (HOSPITAL_COMMUNITY): Payer: Medicare Other | Attending: Cardiology | Admitting: Radiology

## 2012-01-05 DIAGNOSIS — E785 Hyperlipidemia, unspecified: Secondary | ICD-10-CM | POA: Insufficient documentation

## 2012-01-05 DIAGNOSIS — R55 Syncope and collapse: Secondary | ICD-10-CM | POA: Diagnosis not present

## 2012-01-05 DIAGNOSIS — I739 Peripheral vascular disease, unspecified: Secondary | ICD-10-CM | POA: Diagnosis not present

## 2012-01-05 DIAGNOSIS — E782 Mixed hyperlipidemia: Secondary | ICD-10-CM

## 2012-01-05 DIAGNOSIS — R0989 Other specified symptoms and signs involving the circulatory and respiratory systems: Secondary | ICD-10-CM | POA: Insufficient documentation

## 2012-01-05 DIAGNOSIS — E119 Type 2 diabetes mellitus without complications: Secondary | ICD-10-CM | POA: Insufficient documentation

## 2012-01-05 DIAGNOSIS — I25810 Atherosclerosis of coronary artery bypass graft(s) without angina pectoris: Secondary | ICD-10-CM

## 2012-01-05 DIAGNOSIS — Z87891 Personal history of nicotine dependence: Secondary | ICD-10-CM | POA: Insufficient documentation

## 2012-01-12 ENCOUNTER — Encounter (INDEPENDENT_AMBULATORY_CARE_PROVIDER_SITE_OTHER): Payer: Medicare Other | Admitting: Cardiology

## 2012-01-12 DIAGNOSIS — I6529 Occlusion and stenosis of unspecified carotid artery: Secondary | ICD-10-CM

## 2012-01-12 DIAGNOSIS — E782 Mixed hyperlipidemia: Secondary | ICD-10-CM

## 2012-01-12 DIAGNOSIS — R55 Syncope and collapse: Secondary | ICD-10-CM

## 2012-01-12 DIAGNOSIS — I25810 Atherosclerosis of coronary artery bypass graft(s) without angina pectoris: Secondary | ICD-10-CM

## 2012-01-20 DIAGNOSIS — H01009 Unspecified blepharitis unspecified eye, unspecified eyelid: Secondary | ICD-10-CM | POA: Diagnosis not present

## 2012-01-20 DIAGNOSIS — H04129 Dry eye syndrome of unspecified lacrimal gland: Secondary | ICD-10-CM | POA: Diagnosis not present

## 2012-02-03 DIAGNOSIS — L259 Unspecified contact dermatitis, unspecified cause: Secondary | ICD-10-CM | POA: Diagnosis not present

## 2012-02-23 DIAGNOSIS — F29 Unspecified psychosis not due to a substance or known physiological condition: Secondary | ICD-10-CM | POA: Diagnosis not present

## 2012-03-09 DIAGNOSIS — E039 Hypothyroidism, unspecified: Secondary | ICD-10-CM | POA: Diagnosis not present

## 2012-03-09 DIAGNOSIS — G4700 Insomnia, unspecified: Secondary | ICD-10-CM | POA: Diagnosis not present

## 2012-03-09 DIAGNOSIS — E7800 Pure hypercholesterolemia, unspecified: Secondary | ICD-10-CM | POA: Diagnosis not present

## 2012-03-20 ENCOUNTER — Encounter: Payer: Self-pay | Admitting: Internal Medicine

## 2012-03-20 ENCOUNTER — Ambulatory Visit (INDEPENDENT_AMBULATORY_CARE_PROVIDER_SITE_OTHER): Payer: Medicare Other | Admitting: Internal Medicine

## 2012-03-20 VITALS — BP 122/68 | HR 70 | Temp 98.1°F | Wt 131.0 lb

## 2012-03-20 DIAGNOSIS — M62838 Other muscle spasm: Secondary | ICD-10-CM | POA: Diagnosis not present

## 2012-03-20 DIAGNOSIS — L749 Eccrine sweat disorder, unspecified: Secondary | ICD-10-CM

## 2012-03-20 DIAGNOSIS — M6240 Contracture of muscle, unspecified site: Secondary | ICD-10-CM

## 2012-03-20 DIAGNOSIS — L748 Other eccrine sweat disorders: Secondary | ICD-10-CM

## 2012-03-20 DIAGNOSIS — R6889 Other general symptoms and signs: Secondary | ICD-10-CM

## 2012-03-20 NOTE — Progress Notes (Signed)
  Subjective:    Patient ID: Janet Steele, female    DOB: March 23, 1929, 78 y.o.   MRN: [id number]  HPI Her major issue issue is "being hot" for 2 and  1/2 weeks associated with profuse sweating. She also had nausea with loss of appetite.  She denies any swallowing difficulties; she's had no abdominal pain, melena, or rectal bleeding. She has not had a signs of infection such as purulent secretions from chest, diarrhea,  dysuria or pyuria.  She had labs done her endocrinologist last week; thyroid dose  was increased. That physician also prescribed one has to; she has not had this filled as it was not covered by her insurance       Review of Systems She has had exacerbation of involuntary movements of both her head, trunk and extremities. She denies taking any medications for nausea such as Phenergan or Compazine.  She has seen Dr. Erling Cruz for the involuntary movements                  Objective:   Physical Exam The most striking finding is a voluntary jerking of her trunk and limbs; she also has some head trauma she he has no lymphadenopathy about the neck or the  axilla  Chest is clear with no increased work of breathing  She exhibits an S4 with slurring  She has no organomegaly or masses or abdominal tenderness. Bowel sounds are present  Deep tendon reflexes are equal.  Pedal pulses, particularly the dorsalis pedis pulses are decreased.    There is no jaundice or scleral icterus.  No increase in temperature the skin is. She has plethora of her hands        Assessment & Plan:  #1 involuntary movements  #2 subjective increased body temperature with sweats  Plan: See orders and recommendations. Coordination is critical among her endocrinologist, neurologist and myself. I would question possible rare conditions such as hyperthermia but she is afebrile at this time.

## 2012-03-20 NOTE — Patient Instructions (Signed)
Stop the generic Lipitor until labs are back. I will contact your other doctors to optimally assess the present issues. Please try to go on My Chart within the next 24 hours to allow me to release the results directly to you.

## 2012-03-21 LAB — CBC WITH DIFFERENTIAL/PLATELET
Basophils Relative: 0.2 % (ref 0.0–3.0)
Eosinophils Relative: 1.6 % (ref 0.0–5.0)
HCT: 43.2 % (ref 36.0–46.0)
Lymphs Abs: 2.2 10*3/uL (ref 0.7–4.0)
MCHC: 33.5 g/dL (ref 30.0–36.0)
MCV: 91.6 fl (ref 78.0–100.0)
Monocytes Absolute: 0.4 10*3/uL (ref 0.1–1.0)
Neutro Abs: 5.4 10*3/uL (ref 1.4–7.7)
RBC: 4.71 Mil/uL (ref 3.87–5.11)
WBC: 8.1 10*3/uL (ref 4.5–10.5)

## 2012-03-21 LAB — BASIC METABOLIC PANEL
BUN: 18 mg/dL (ref 6–23)
CO2: 26 mEq/L (ref 19–32)
Calcium: 9.7 mg/dL (ref 8.4–10.5)
Creatinine, Ser: 0.9 mg/dL (ref 0.4–1.2)
Glucose, Bld: 124 mg/dL — ABNORMAL HIGH (ref 70–99)

## 2012-03-24 ENCOUNTER — Telehealth: Payer: Self-pay

## 2012-03-24 NOTE — Telephone Encounter (Signed)
Message left on voicemail: Patient would like to inform Dr.Hopper that when he talks to [facility] make sure he stresses that her excessive tremors and involuntary muscle activity.   Patient states there is no need to call her back

## 2012-03-28 NOTE — Telephone Encounter (Signed)
Records were faxed to Dr. love. I requested that these be reFAXed  with a copy of this phone note

## 2012-03-28 NOTE — Telephone Encounter (Signed)
Dr.Hopper informed

## 2012-04-11 DIAGNOSIS — Z1231 Encounter for screening mammogram for malignant neoplasm of breast: Secondary | ICD-10-CM | POA: Diagnosis not present

## 2012-04-12 DIAGNOSIS — H10409 Unspecified chronic conjunctivitis, unspecified eye: Secondary | ICD-10-CM | POA: Diagnosis not present

## 2012-04-13 ENCOUNTER — Other Ambulatory Visit: Payer: Self-pay | Admitting: Neurological Surgery

## 2012-04-13 DIAGNOSIS — M545 Low back pain: Secondary | ICD-10-CM

## 2012-04-17 ENCOUNTER — Ambulatory Visit
Admission: RE | Admit: 2012-04-17 | Discharge: 2012-04-17 | Disposition: A | Discharge status: Home / Self Care | Payer: Medicare Other | Source: Ambulatory Visit | Attending: Neurological Surgery | Admitting: Neurological Surgery

## 2012-04-17 VITALS — BP 125/58 | HR 45

## 2012-04-17 DIAGNOSIS — M47817 Spondylosis without myelopathy or radiculopathy, lumbosacral region: Secondary | ICD-10-CM | POA: Diagnosis not present

## 2012-04-17 DIAGNOSIS — M545 Low back pain: Secondary | ICD-10-CM

## 2012-04-17 MED ORDER — METHYLPREDNISOLONE ACETATE 40 MG/ML INJ SUSP (RADIOLOG
120.0000 mg | Freq: Once | INTRAMUSCULAR | Status: AC
  Administered 2012-04-17: 120 mg via EPIDURAL

## 2012-04-17 MED ORDER — IOHEXOL 180 MG/ML  SOLN
1.0000 mL | Freq: Once | INTRAMUSCULAR | Status: AC | PRN
  Administered 2012-04-17: 1 mL via EPIDURAL

## 2012-04-21 ENCOUNTER — Encounter: Payer: Self-pay | Admitting: Internal Medicine

## 2012-04-26 DIAGNOSIS — E039 Hypothyroidism, unspecified: Secondary | ICD-10-CM | POA: Diagnosis not present

## 2012-06-14 DIAGNOSIS — H10409 Unspecified chronic conjunctivitis, unspecified eye: Secondary | ICD-10-CM | POA: Diagnosis not present

## 2012-06-16 ENCOUNTER — Encounter: Payer: Self-pay | Admitting: Cardiovascular Disease

## 2012-06-16 ENCOUNTER — Ambulatory Visit (INDEPENDENT_AMBULATORY_CARE_PROVIDER_SITE_OTHER): Payer: Medicare Other | Admitting: Cardiovascular Disease

## 2012-06-16 VITALS — BP 106/64 | HR 57 | Ht 60.0 in | Wt 132.0 lb

## 2012-06-16 DIAGNOSIS — E7800 Pure hypercholesterolemia, unspecified: Secondary | ICD-10-CM | POA: Diagnosis not present

## 2012-06-16 MED ORDER — ATORVASTATIN CALCIUM 80 MG PO TABS: 80.0000 mg | ORAL_TABLET | Freq: Every day | ORAL | Status: DC

## 2012-06-16 NOTE — Progress Notes (Signed)
HPI:  78 year old woman presenting for followup evaluation. The patient has extensive CAD and is status post redo CABG in 2002. Her last nuclear stress test was in 2011 and this demonstrated mixed ischemia and infarction in the basal to mid anterolateral and inferolateral walls. This is unchanged from her 2006 study. When she was seen last in January 2013 she complained of postural dizziness. An echocardiogram was done and it showed no major abnormalities. Carotid duplex scan demonstrated less than 40% carotid stenosis bilaterally.  The patient has multiple complaints today. She complains of vision loss, involuntary head movements, and aching all over. She's had no chest pain or pressure. She feels like she "gives out "when she goes up stairs. She denies orthopnea, PND, or edema. She denies palpitations or syncope.  Outpatient Encounter Prescriptions as of 06/16/2012  Medication Sig Dispense Refill  . aspirin 325 MG EC tablet Take 325 mg by mouth daily.        Marland Kitchen atorvastatin (LIPITOR) 80 MG tablet Take 1 tablet (80 mg total) by mouth daily.  90 tablet  3  . DULoxetine (CYMBALTA) 60 MG capsule Take 1 capsule (60 mg total) by mouth as needed.  30 capsule  6  . glimepiride (AMARYL) 2 MG tablet Take 2 mg by mouth daily before breakfast.       . glucose blood (FREESTYLE LITE) test strip 1 each by Other route as needed. Use as instructed       . levothyroxine (SYNTHROID, LEVOTHROID) 150 MCG tablet Take 150 mcg by mouth daily.      . metFORMIN (GLUCOPHAGE) 500 MG tablet Take 500 mg by mouth daily with breakfast.       . nitroGLYCERIN (NITROSTAT) 0.4 MG SL tablet Place 0.4 mg under the tongue every 5 (five) minutes as needed.        Marland Kitchen omeprazole (PRILOSEC OTC) 20 MG tablet Take 1 tablet (20 mg total) by mouth daily.  28 tablet  1  . traZODone (DESYREL) 50 MG tablet Take 150 mg by mouth at bedtime as needed.       Marland Kitchen DISCONTD: levothyroxine (SYNTHROID, LEVOTHROID) 150 MCG tablet 1/2 tablet daily  90 tablet   0    Allergies  Allergen Reactions  . Codeine     REACTION: rash  . Morphine     REACTION: hives  . Penicillins     REACTION: itching/rash  . Sulfonamide Derivatives     REACTION: itching, rash  . Hydrocodone-Acetaminophen     REACTION: unspecified  . Meperidine Hcl     REACTION: itching  . Metformin     REACTION: nervous  . Metoclopramide Hcl     REACTION: unspecified  . Metoprolol Succinate     REACTION: nervous  . Moxifloxacin     REACTION: itching  . Oxycodone-Aspirin     REACTION: rash  . Pentazocine Lactate     REACTION: ? nausea  . Pioglitazone     REACTION: bloating  . Tramadol Hcl     REACTION: unspecified    Past Medical History  Diagnosis Date  . Thyroid disease   . Hyperlipidemia   . GERD (gastroesophageal reflux disease)   . Anxiety   . Diabetes mellitus   . Hypertension   . PVD (peripheral vascular disease)   . Carotid bruit   . Other dysphagia   . Other esophagitis   . Hemorrhoid   . Other constipation   . Colon, diverticulosis   . Hiatal hernia   . Osteoarthrosis, unspecified  whether generalized or localized, unspecified site   . Degenerative joint disease   . Esophageal stricture   . Unspecified hearing loss     ROS: Negative except as per HPI  BP 106/64  Pulse 57  Ht 5' (1.524 m)  Wt 59.875 kg (132 lb)  BMI 25.78 kg/m2  SpO2 97%  PHYSICAL EXAM: Pt is alert and oriented, NAD, but she makes involuntary head shaking movements HEENT: normal Neck: JVP - normal, carotids 2+= without bruits Lungs: CTA bilaterally CV: RRR without murmur or gallop Abd: soft, NT, Positive BS, no hepatomegaly Ext: no C/C/E, distal pulses intact and equal Skin: warm/dry no rash  EKG:  Sinus rhythm with nonspecific intraventricular conduction delay, age indeterminate inferior infarct, and ST and T wave abnormality consider anterolateral ischemia.  ASSESSMENT AND PLAN: 1. CAD status post CABG. Most recent bypass surgery was in 2002. Her nuclear scan  results were reviewed from 2011 they were stable compared to her 2006 result. We'll continue with current medical management.  2. Myalgias. I agree that she should have a statin holiday. Will hold atorvastatin and call her in 3-4 weeks to see how she is doing.  Sherren Mocha 06/16/2012 6:01 PM

## 2012-06-16 NOTE — Patient Instructions (Addendum)
Your physician wants you to follow-up in: 6 months with Dr Emelda Fear will receive a reminder letter in the mail two months in advance. If you don't receive a letter, please call our office to schedule the follow-up appointment.  Your physician has recommended you make the following change in your medication:  1) DO NOT take Lipitor for 4 weeks---then call us and let us know if you are feeling better  [phone]

## 2012-06-20 DIAGNOSIS — E119 Type 2 diabetes mellitus without complications: Secondary | ICD-10-CM | POA: Diagnosis not present

## 2012-06-20 DIAGNOSIS — H04129 Dry eye syndrome of unspecified lacrimal gland: Secondary | ICD-10-CM | POA: Diagnosis not present

## 2012-06-20 DIAGNOSIS — R259 Unspecified abnormal involuntary movements: Secondary | ICD-10-CM | POA: Diagnosis not present

## 2012-06-23 ENCOUNTER — Other Ambulatory Visit: Payer: Self-pay | Admitting: Dermatology

## 2012-06-23 ENCOUNTER — Ambulatory Visit (INDEPENDENT_AMBULATORY_CARE_PROVIDER_SITE_OTHER): Payer: Medicare Other | Admitting: Internal Medicine

## 2012-06-23 ENCOUNTER — Encounter: Payer: Self-pay | Admitting: Internal Medicine

## 2012-06-23 VITALS — BP 118/66 | HR 71 | Temp 97.9°F | Wt 132.4 lb

## 2012-06-23 DIAGNOSIS — R5383 Other fatigue: Secondary | ICD-10-CM | POA: Diagnosis not present

## 2012-06-23 DIAGNOSIS — T148XXA Other injury of unspecified body region, initial encounter: Secondary | ICD-10-CM | POA: Diagnosis not present

## 2012-06-23 DIAGNOSIS — E039 Hypothyroidism, unspecified: Secondary | ICD-10-CM | POA: Diagnosis not present

## 2012-06-23 DIAGNOSIS — C44621 Squamous cell carcinoma of skin of unspecified upper limb, including shoulder: Secondary | ICD-10-CM | POA: Diagnosis not present

## 2012-06-23 DIAGNOSIS — L259 Unspecified contact dermatitis, unspecified cause: Secondary | ICD-10-CM | POA: Diagnosis not present

## 2012-06-23 DIAGNOSIS — D485 Neoplasm of uncertain behavior of skin: Secondary | ICD-10-CM | POA: Diagnosis not present

## 2012-06-23 DIAGNOSIS — R5381 Other malaise: Secondary | ICD-10-CM | POA: Diagnosis not present

## 2012-06-23 DIAGNOSIS — H579 Unspecified disorder of eye and adnexa: Secondary | ICD-10-CM

## 2012-06-23 DIAGNOSIS — D0460 Carcinoma in situ of skin of unspecified upper limb, including shoulder: Secondary | ICD-10-CM | POA: Diagnosis not present

## 2012-06-23 DIAGNOSIS — L988 Other specified disorders of the skin and subcutaneous tissue: Secondary | ICD-10-CM | POA: Diagnosis not present

## 2012-06-23 LAB — TSH: TSH: 24.32 u[IU]/mL — ABNORMAL HIGH (ref 0.35–5.50)

## 2012-06-23 LAB — CBC WITH DIFFERENTIAL/PLATELET
Basophils Absolute: 0 10*3/uL (ref 0.0–0.1)
HCT: 43.2 % (ref 36.0–46.0)
Lymphs Abs: 1.7 10*3/uL (ref 0.7–4.0)
Monocytes Absolute: 0.4 10*3/uL (ref 0.1–1.0)
Monocytes Relative: 5.5 % (ref 3.0–12.0)
Neutrophils Relative %: 66.8 % (ref 43.0–77.0)
Platelets: 256 10*3/uL (ref 150.0–400.0)
RDW: 14.8 % — ABNORMAL HIGH (ref 11.5–14.6)

## 2012-06-23 NOTE — Progress Notes (Signed)
  Subjective:    Patient ID: Janet Steele, female    DOB: 1929-11-01, 79 y.o.   MRN: [id number]  HPI She describes a haziness in her vision and itching in her eyes. Her Ophthalmologist recommended she have "urine and blood tests ".  Her most recent lab studies were 03/20/12. Glucose was 124; she is followed by Dr. Chalmers Cater, endocrinologist. All other studies were negative or normal except for a TSH of 11.93 on 11/12/11.    Review of Systems She is describing fatigue. She believes that she had thyroid function test done approximately 6 weeks ago at her endocrinologist's  office and these were "fine". Her cardiologist stopped her 80 mg of atorvastatin last week because of myalgias.  She does describe significant bruising or bleeding     Objective:   Physical Exam Gen.:  well-nourished; in no acute distress Eyes: Extraocular motion intact; no lid lag or proptosis Neck: Thyroid without nodules or enlargement Heart: Normal rhythm and rate without significant murmur, gallop, or extra heart sounds. S4 with slight slurring Lungs: Chest clear to auscultation without rales,rales, wheezes  Abdomen: No tenderness, masses, organomegaly Neuro:Deep tendon reflexes are equal and within normal limits; no tremor  Skin: Warm and dry without significant lesions or rashes; no onycholysis. Bruising is noted over the dorsum of the hands. Feet are cool  Pulses: No carotid bruits present. Dorsalis pedis pulses are decreased Psych: Normally communicative and interactive; no abnormal mood or affect clinically.         Assessment & Plan:  #1 visual dysfunction  #2 fatigue  #3 hypothyroidism; no current TSH in EMR  #4 bruising  Plan: See orders and recommendations

## 2012-06-23 NOTE — Patient Instructions (Addendum)
Please try to go on My Chart within the next 24 hours to allow me to release the results directly to you. Share results with all MDs seen

## 2012-07-04 ENCOUNTER — Telehealth: Payer: Self-pay | Admitting: *Deleted

## 2012-07-04 NOTE — Telephone Encounter (Signed)
Pt husband calling for the results of labs done on 06-23-12. Please advise

## 2012-07-05 ENCOUNTER — Ambulatory Visit (INDEPENDENT_AMBULATORY_CARE_PROVIDER_SITE_OTHER): Payer: Medicare Other | Admitting: Internal Medicine

## 2012-07-05 ENCOUNTER — Encounter (HOSPITAL_COMMUNITY): Payer: Self-pay | Admitting: *Deleted

## 2012-07-05 ENCOUNTER — Inpatient Hospital Stay (HOSPITAL_COMMUNITY)
Admission: EM | Admit: 2012-07-05 | Discharge: 2012-07-07 | DRG: 244 | Disposition: A | Discharge status: Home / Self Care | Payer: Medicare Other | Attending: Internal Medicine | Admitting: Internal Medicine

## 2012-07-05 VITALS — BP 100/58 | HR 34 | Temp 97.6°F | Wt 129.0 lb

## 2012-07-05 DIAGNOSIS — F3289 Other specified depressive episodes: Secondary | ICD-10-CM | POA: Diagnosis present

## 2012-07-05 DIAGNOSIS — K222 Esophageal obstruction: Secondary | ICD-10-CM

## 2012-07-05 DIAGNOSIS — R05 Cough: Secondary | ICD-10-CM

## 2012-07-05 DIAGNOSIS — R1319 Other dysphagia: Secondary | ICD-10-CM

## 2012-07-05 DIAGNOSIS — K449 Diaphragmatic hernia without obstruction or gangrene: Secondary | ICD-10-CM | POA: Diagnosis present

## 2012-07-05 DIAGNOSIS — Z79899 Other long term (current) drug therapy: Secondary | ICD-10-CM

## 2012-07-05 DIAGNOSIS — Z7982 Long term (current) use of aspirin: Secondary | ICD-10-CM | POA: Diagnosis not present

## 2012-07-05 DIAGNOSIS — E1149 Type 2 diabetes mellitus with other diabetic neurological complication: Secondary | ICD-10-CM | POA: Diagnosis present

## 2012-07-05 DIAGNOSIS — E119 Type 2 diabetes mellitus without complications: Secondary | ICD-10-CM | POA: Diagnosis present

## 2012-07-05 DIAGNOSIS — E782 Mixed hyperlipidemia: Secondary | ICD-10-CM

## 2012-07-05 DIAGNOSIS — I252 Old myocardial infarction: Secondary | ICD-10-CM | POA: Diagnosis not present

## 2012-07-05 DIAGNOSIS — R259 Unspecified abnormal involuntary movements: Secondary | ICD-10-CM

## 2012-07-05 DIAGNOSIS — Z950 Presence of cardiac pacemaker: Secondary | ICD-10-CM

## 2012-07-05 DIAGNOSIS — I10 Essential (primary) hypertension: Secondary | ICD-10-CM | POA: Diagnosis not present

## 2012-07-05 DIAGNOSIS — R42 Dizziness and giddiness: Secondary | ICD-10-CM

## 2012-07-05 DIAGNOSIS — I25810 Atherosclerosis of coronary artery bypass graft(s) without angina pectoris: Secondary | ICD-10-CM | POA: Diagnosis not present

## 2012-07-05 DIAGNOSIS — I2510 Atherosclerotic heart disease of native coronary artery without angina pectoris: Secondary | ICD-10-CM | POA: Diagnosis not present

## 2012-07-05 DIAGNOSIS — Z87891 Personal history of nicotine dependence: Secondary | ICD-10-CM

## 2012-07-05 DIAGNOSIS — E781 Pure hyperglyceridemia: Secondary | ICD-10-CM

## 2012-07-05 DIAGNOSIS — K5909 Other constipation: Secondary | ICD-10-CM | POA: Diagnosis present

## 2012-07-05 DIAGNOSIS — I498 Other specified cardiac arrhythmias: Secondary | ICD-10-CM

## 2012-07-05 DIAGNOSIS — K219 Gastro-esophageal reflux disease without esophagitis: Secondary | ICD-10-CM | POA: Diagnosis present

## 2012-07-05 DIAGNOSIS — G479 Sleep disorder, unspecified: Secondary | ICD-10-CM

## 2012-07-05 DIAGNOSIS — I739 Peripheral vascular disease, unspecified: Secondary | ICD-10-CM | POA: Diagnosis present

## 2012-07-05 DIAGNOSIS — E876 Hypokalemia: Secondary | ICD-10-CM | POA: Diagnosis not present

## 2012-07-05 DIAGNOSIS — F329 Major depressive disorder, single episode, unspecified: Secondary | ICD-10-CM | POA: Diagnosis present

## 2012-07-05 DIAGNOSIS — R001 Bradycardia, unspecified: Secondary | ICD-10-CM

## 2012-07-05 DIAGNOSIS — F411 Generalized anxiety disorder: Secondary | ICD-10-CM | POA: Diagnosis present

## 2012-07-05 DIAGNOSIS — R531 Weakness: Secondary | ICD-10-CM | POA: Diagnosis present

## 2012-07-05 DIAGNOSIS — R5383 Other fatigue: Secondary | ICD-10-CM | POA: Diagnosis not present

## 2012-07-05 DIAGNOSIS — E039 Hypothyroidism, unspecified: Secondary | ICD-10-CM | POA: Diagnosis not present

## 2012-07-05 DIAGNOSIS — K5730 Diverticulosis of large intestine without perforation or abscess without bleeding: Secondary | ICD-10-CM

## 2012-07-05 DIAGNOSIS — R5381 Other malaise: Secondary | ICD-10-CM | POA: Diagnosis not present

## 2012-07-05 DIAGNOSIS — E785 Hyperlipidemia, unspecified: Secondary | ICD-10-CM | POA: Diagnosis present

## 2012-07-05 DIAGNOSIS — R079 Chest pain, unspecified: Secondary | ICD-10-CM

## 2012-07-05 DIAGNOSIS — H9190 Unspecified hearing loss, unspecified ear: Secondary | ICD-10-CM | POA: Diagnosis present

## 2012-07-05 DIAGNOSIS — Z951 Presence of aortocoronary bypass graft: Secondary | ICD-10-CM

## 2012-07-05 DIAGNOSIS — R55 Syncope and collapse: Secondary | ICD-10-CM

## 2012-07-05 DIAGNOSIS — J209 Acute bronchitis, unspecified: Secondary | ICD-10-CM

## 2012-07-05 DIAGNOSIS — E1142 Type 2 diabetes mellitus with diabetic polyneuropathy: Secondary | ICD-10-CM | POA: Diagnosis present

## 2012-07-05 DIAGNOSIS — M1990 Unspecified osteoarthritis, unspecified site: Secondary | ICD-10-CM | POA: Diagnosis present

## 2012-07-05 DIAGNOSIS — R0989 Other specified symptoms and signs involving the circulatory and respiratory systems: Secondary | ICD-10-CM

## 2012-07-05 DIAGNOSIS — J90 Pleural effusion, not elsewhere classified: Secondary | ICD-10-CM | POA: Diagnosis not present

## 2012-07-05 DIAGNOSIS — K649 Unspecified hemorrhoids: Secondary | ICD-10-CM

## 2012-07-05 HISTORY — DX: Other ill-defined heart diseases: I51.89

## 2012-07-05 HISTORY — DX: Hypothyroidism, unspecified: E03.9

## 2012-07-05 HISTORY — DX: Atherosclerotic heart disease of native coronary artery without angina pectoris: I25.10

## 2012-07-05 LAB — COMPREHENSIVE METABOLIC PANEL
ALT: 12 U/L (ref 0–35)
Albumin: 3.7 g/dL (ref 3.5–5.2)
Alkaline Phosphatase: 83 U/L (ref 39–117)
Potassium: 3.9 mEq/L (ref 3.5–5.1)
Sodium: 136 mEq/L (ref 135–145)
Total Protein: 6.4 g/dL (ref 6.0–8.3)

## 2012-07-05 LAB — GLUCOSE, CAPILLARY: Glucose-Capillary: 131 mg/dL — ABNORMAL HIGH (ref 70–99)

## 2012-07-05 LAB — CBC
Hemoglobin: 12.3 g/dL (ref 12.0–15.0)
MCH: 29.3 pg (ref 26.0–34.0)
MCHC: 33.5 g/dL (ref 30.0–36.0)
MCHC: 34.1 g/dL (ref 30.0–36.0)
Platelets: 252 10*3/uL (ref 150–400)
RBC: 4.6 MIL/uL (ref 3.87–5.11)
RDW: 14.4 % (ref 11.5–15.5)

## 2012-07-05 LAB — CREATININE, SERUM
Creatinine, Ser: 0.84 mg/dL (ref 0.50–1.10)
GFR calc non Af Amer: 64 mL/min — ABNORMAL LOW (ref >90)

## 2012-07-05 LAB — CARDIAC PANEL(CRET KIN+CKTOT+MB+TROPI)
Relative Index: 3.3 — ABNORMAL HIGH (ref 0.0–2.5)
Troponin I: <0.3 ng/mL (ref <0.30)

## 2012-07-05 LAB — LIPID PANEL
LDL Cholesterol: 234 mg/dL — ABNORMAL HIGH (ref 0–99)
Total CHOL/HDL Ratio: 10.4 RATIO
VLDL: 66 mg/dL — ABNORMAL HIGH (ref 0–40)

## 2012-07-05 MED ORDER — NITROGLYCERIN 0.4 MG SL SUBL: 0.4000 mg | SUBLINGUAL_TABLET | SUBLINGUAL | Status: DC | PRN

## 2012-07-05 MED ORDER — ACETAMINOPHEN 325 MG PO TABS: 650.0000 mg | ORAL_TABLET | Freq: Four times a day (QID) | ORAL | Status: DC | PRN

## 2012-07-05 MED ORDER — GUANFACINE HCL 1 MG PO TABS
1.5000 mg | ORAL_TABLET | Freq: Every day | ORAL | Status: DC
  Filled 2012-07-05: qty 2

## 2012-07-05 MED ORDER — GUANFACINE HCL 1 MG PO TABS
1.0000 mg | ORAL_TABLET | Freq: Every day | ORAL | Status: DC
  Administered 2012-07-06: 22:00:00 1 mg via ORAL
  Filled 2012-07-05 (×3): qty 1

## 2012-07-05 MED ORDER — LEVOTHYROXINE SODIUM 100 MCG PO TABS
100.0000 ug | ORAL_TABLET | Freq: Every day | ORAL | Status: DC
  Administered 2012-07-06 – 2012-07-07 (×2): 100 ug via ORAL
  Filled 2012-07-05 (×4): qty 1

## 2012-07-05 MED ORDER — ACETAMINOPHEN 650 MG RE SUPP: 650.0000 mg | Freq: Four times a day (QID) | RECTAL | Status: DC | PRN

## 2012-07-05 MED ORDER — ONDANSETRON HCL 4 MG PO TABS: 4.0000 mg | ORAL_TABLET | Freq: Four times a day (QID) | ORAL | Status: DC | PRN

## 2012-07-05 MED ORDER — ASPIRIN EC 325 MG PO TBEC
325.0000 mg | DELAYED_RELEASE_TABLET | Freq: Every day | ORAL | Status: DC
  Administered 2012-07-05 – 2012-07-07 (×3): 325 mg via ORAL
  Filled 2012-07-05 (×3): qty 1

## 2012-07-05 MED ORDER — ONDANSETRON HCL 4 MG/2ML IJ SOLN: 4.0000 mg | Freq: Four times a day (QID) | INTRAMUSCULAR | Status: DC | PRN

## 2012-07-05 MED ORDER — TRAZODONE HCL 150 MG PO TABS
150.0000 mg | ORAL_TABLET | Freq: Every evening | ORAL | Status: DC | PRN
  Administered 2012-07-05: 150 mg via ORAL
  Filled 2012-07-05: qty 1

## 2012-07-05 MED ORDER — ASPIRIN 81 MG PO CHEW
CHEWABLE_TABLET | ORAL | Status: AC
  Filled 2012-07-05: qty 4

## 2012-07-05 MED ORDER — INSULIN ASPART 100 UNIT/ML ~~LOC~~ SOLN
0.0000 [IU] | Freq: Three times a day (TID) | SUBCUTANEOUS | Status: DC
  Administered 2012-07-06 (×2): 1 [IU] via SUBCUTANEOUS
  Administered 2012-07-07 (×2): 2 [IU] via SUBCUTANEOUS

## 2012-07-05 MED ORDER — SODIUM CHLORIDE 0.9 % IJ SOLN
3.0000 mL | Freq: Two times a day (BID) | INTRAMUSCULAR | Status: DC
  Administered 2012-07-05 – 2012-07-06 (×2): 3 mL via INTRAVENOUS

## 2012-07-05 MED ORDER — PANTOPRAZOLE SODIUM 40 MG PO TBEC
40.0000 mg | DELAYED_RELEASE_TABLET | Freq: Every day | ORAL | Status: DC
  Administered 2012-07-06 – 2012-07-07 (×2): 40 mg via ORAL
  Filled 2012-07-05 (×2): qty 1

## 2012-07-05 MED ORDER — HEPARIN SODIUM (PORCINE) 5000 UNIT/ML IJ SOLN
5000.0000 [IU] | Freq: Three times a day (TID) | INTRAMUSCULAR | Status: DC
  Administered 2012-07-05 – 2012-07-06 (×3): 5000 [IU] via SUBCUTANEOUS
  Filled 2012-07-05 (×5): qty 1

## 2012-07-05 NOTE — ED Provider Notes (Signed)
I saw and evaluated the patient, reviewed the resident's note and I agree with the findings and plan. 78 year old, female, with a history of myocardial infarction approximately 20 years ago, presents emergency department with bradycardia causing, lightheadedness.  She was seen by her primary care physician, for the lightheadedness.  He noticed her bradycardia and sent her here.  She denies pain anywhere.  She has not been sick recently.  On, examination.  She is in no distress.  Her neurological function is normal.  She has no carotid bruits.  Her lungs are clear.  She has a slow heart beat without murmurs.  Her abdomen is benign.  We will get EKG, and laboratory testing, and consult cardiology, for possible placement of a pacemaker  Barbara Cower, MD 07/05/12 1711

## 2012-07-05 NOTE — ED Notes (Signed)
Pt inquiring about the next steps of her visit; asked RN and informed pt that she is waiting on the cardiologist to come and see her; pt acknowledges and understands

## 2012-07-05 NOTE — ED Notes (Signed)
Feeling awful, weak, dizzy. Duration: x 2 weeks. Pt. Does jerk when she gets upset. No cp.

## 2012-07-05 NOTE — Progress Notes (Signed)
  Subjective:    Patient ID: Janet Steele, female    DOB: Sep 09, 1929, 78 y.o.   MRN: [id number]  HPI She continues to have non positional dizziness. Her most recent labs were reviewed. The CBC and differential was normal except for slightly elevated RDW of no significance. Her TSH was 24.3 to, up from a value of 11.93 on 11/12/11.  She states when the TSH was 11.93 she was on a half a pill of thyroid but was increased to a whole pill. Despite this history, as noted her TSH is now 24.37. she states that her Endocrinologist performed a thyroid test recently and she was notified that it " was fine"  She states that she is taking the same brand and dose of thyroid without change in how it is administered. Unfortunately she did not bring the actual pill bottles. She states it is prescribed by Dr. Chalmers Cater.  She brought her circular pill dispenser. What she describes as her diabetes pill is a small aqua green pill with 93 on one side and 72 55 on the other. This was in the section of the  pill dispenser label as "levothyroxin 150." There is also a chamber labeled" thyroid" which contains a small light pill with L 7 on one side and M on the other.   Review of Systems she describes "hazy" vision with this dizziness; she has been evaluated by her ophthalmologist      Objective:   Physical Exam  Her head myoclonic jerking has increased since last visit. Oriented X 3. Her  blood pressures are low; there is no exacerbation of this with position change. Pulse recheck 36. No carotid bruits.      Assessment & Plan:  #1 dizziness with relative hypotension  #2 profound bradycardia; EKG reveals profound sinus bradycardia with a rate of 36. I reviewed her last cardiology note; that time her pulse was 57 #3 severe hypothyroidism based on the TSH of 24.32.The pathophysiology of hypothyroidism  was discussed along with the TSH goals.   Plan: #1 and we'll contact her cardiologist this is a candidate for a  pacer  #2The only way to address this apparent profound hypothyroidism is to review the actual pill bottles and verify the prescribing physician . Additionally she was given copies of her labs to provide continuity of care  #3 Isometrics will be recommended prior to standing

## 2012-07-05 NOTE — Consult Note (Signed)
Cardiology Consult Note   Patient ID: YAKELIN HINDMON MRN: [id number], DOB/AGE: March 31, 1929   Admit date: 07/05/2012 Date of Consult: 07/05/2012  Primary Physician: Unice Cobble, MD Primary Cardiologist: Sherren Mocha, MD  Pt. Profile: Ms. Moomaw is a 78yo female with PMHx significant for CAD (s/p CABG 1982, redo 2002; nuc stress test 2011- mixed infarction/ischemia in basal to mid anterolateral and inferolateral walls, unchanged from 2006, medically managed), chronic diastolic CHF, type 2 DM, HTN, PVD and hypothyroidism who was admitted to [facility] on 07/05/12 for bradycardia and weakness in the setting of uncontrolled hypothyroidism.   Reason for consult: evaluation/management of sinus bradycardia in setting of hypothyroidism  RECENT CARDIAC STUDIES  2D ECHO 12/2011:   Study Conclusions  - Left ventricle: The cavity size was normal. Wall thickness was normal. Systolic function was normal. The estimated ejection fraction was in the range of 55% to 60%. Wall motion was normal; there were no regional wall motion abnormalities. Doppler parameters are consistent with abnormal left ventricular relaxation (grade 1 diastolic dysfunction). - Aortic valve: There was no stenosis. - Mitral valve: Mildly calcified annulus. Mild regurgitation. - Left atrium: The atrium was moderately dilated. - Right ventricle: The cavity size was normal. Systolic function was normal. - Pulmonary arteries: PA systolic pressure [id number]. - Systemic veins: IVC measured 1.9 cm with normal respirophasic variation, suggesting RA pressure 6-10 mmHg. Impressions:  - Normal LV size and systolic function, EF 0000000. Normal RV size and systolic function. Moderate left atrial enlargement. Mild mitral regurgitation.   LEXISCAN MYOVIEW - 06/2010  Overall Impression  Exercise Capacity: Lexiscan study  BP Response: Normal blood pressure response.  Clinical Symptoms: Headache  ECG Impression: No  significant ST segment change suggestive of ischemia.  Overall Impression: Mild to moderate partially reversible basal to mid inferolateral and anterolateral perfusion defect.  Overall Impression Comments: This is suggestive of mixed ischemia and infarction in the basal to mid anterolateral and inferolateral walls. EF is preserved. I compared the old study from 2006 and there does not appear to be significant change from that time.  Appended Document: Cardiology Nuclear Study  Reviewed - unchanged from 2006 study. continue with medical therapy unless progressive anginal symptoms.  HPI:  The patient reports experiencing progressive fatigue and weakness over the past 3 weeks, but most noticeably the last 10 days. She states she just did not have energy. She notes experiencing one syncopal episode after standing suddenly in the bathroom where she lost consciousness for several seconds. She hit her back and left leg, but not her head. She denies chest pain, shortness of breath, palpitations, lightheadedness, n/v, PND, LE edema, orthopnea, fevers, chills, diarrhea, sick contacts or active bleeding.   Upon ED arrival, EKG and telemetry reveals sinus bradycardia with some junctional beats (HR down to 30s) without ischemia changes. Trop-I WNL. BMET and CBC WNL. TSH on 06/23/2012 elevated at 24.32.   Problem List: Past Medical History  Diagnosis Date  . Thyroid disease   . Hyperlipidemia   . GERD (gastroesophageal reflux disease)   . Anxiety   . Diabetes mellitus   . Hypertension   . PVD (peripheral vascular disease)   . Carotid bruit   . Other dysphagia   . Other esophagitis   . Hemorrhoid   . Other constipation   . Colon, diverticulosis   . Hiatal hernia   . Osteoarthrosis, unspecified whether generalized or localized, unspecified site   . Degenerative joint disease   . Esophageal stricture   .  Unspecified hearing loss   . Diastolic dysfunction     Past Surgical History  Procedure Date    . Appendectomy   . Cholecystectomy   . Abdominal hysterectomy   . Oophorectomy     bilateral  . Coronary artery bypass graft     x4(1982),02-2002 CABG X2  . Back surgery 01-2000     Allergies:  Allergies  Allergen Reactions  . Codeine     REACTION: rash  . Morphine     REACTION: hives  . Penicillins     REACTION: itching/rash  . Sulfonamide Derivatives     REACTION: itching, rash  . Hydrocodone-Acetaminophen     REACTION: unspecified  . Meperidine Hcl     REACTION: itching  . Metformin     REACTION: nervous  . Metoclopramide Hcl     REACTION: unspecified  . Metoprolol Succinate     REACTION: nervous  . Moxifloxacin     REACTION: itching  . Oxycodone-Aspirin     REACTION: rash  . Pentazocine Lactate     REACTION: ? nausea  . Pioglitazone     REACTION: bloating  . Tramadol Hcl     REACTION: unspecified    Home Medications: Prior to Admission medications   Medication Sig Start Date End Date Taking? Authorizing Provider  aspirin 325 MG EC tablet Take 325 mg by mouth daily.     Yes Historical Provider, MD  Bepotastine Besilate 1.5 % SOLN Place 1 drop into both eyes daily as needed. For itchy eyes   Yes Historical Provider, MD  DULoxetine (CYMBALTA) 60 MG capsule Take 60 mg by mouth daily as needed. For foot pain 10/20/11 10/19/12 Yes Hendricks Limes, MD  glimepiride (AMARYL) 2 MG tablet Take 2 mg by mouth daily before breakfast.  04/02/11  Yes Hendricks Limes, MD  guanFACINE (TENEX) 1 MG tablet Take 1.5 mg by mouth at bedtime.    Yes Historical Provider, MD  levothyroxine (SYNTHROID, LEVOTHROID) 88 MCG tablet Take 88 mcg by mouth daily.   Yes Historical Provider, MD  loteprednol (ALREX) 0.2 % SUSP Place 1 drop into both eyes daily as needed. For itchy eyes   Yes Historical Provider, MD  metFORMIN (GLUCOPHAGE) 500 MG tablet Take 500 mg by mouth daily with breakfast.  04/07/11  Yes Hendricks Limes, MD  nitroGLYCERIN (NITROSTAT) 0.4 MG SL tablet Place 0.4 mg under the  tongue every 5 (five) minutes as needed. For chest pain   Yes Historical Provider, MD  omeprazole (PRILOSEC OTC) 20 MG tablet Take 20 mg by mouth daily as needed. For reflux 12/17/11 12/16/12 Yes Irene Shipper, MD  traZODone (DESYREL) 50 MG tablet Take 150 mg by mouth at bedtime as needed.    Yes Historical Provider, MD    Inpatient Medications:      (Not in a hospital admission)  Family History  Problem Relation Age of Onset  . Heart attack Father   . Heart disease Sister   . Colon cancer Neg Hx      History   Social History  . Marital Status: Married    Spouse Name: N/A    Number of Children: N/A  . Years of Education: N/A   Occupational History  . Retired     Social History Main Topics  . Smoking status: Former Research scientist (life sciences)  . Smokeless tobacco: Never Used   Comment: Quit in 1977   . Alcohol Use: No  . Drug Use: No  . Sexually Active: Not on file  Other Topics Concern  . Not on file   Social History Narrative  . No narrative on file     Review of Systems: General: positive for fatigue, weakness, negative for chills, fever, night sweats or weight changes.  Cardiovascular:  negative for chest pain, dyspnea on exertion, edema, orthopnea, palpitations, paroxysmal nocturnal dyspnea or shortness of breath Dermatological: negative for rash Respiratory: negative for cough or wheezing Urologic: negative for hematuria Abdominal:  negative for nausea, vomiting, diarrhea, bright red blood per rectum, melena, or hematemesis Neurologic: positive for syncope, negative for visual changes or dizziness All other systems reviewed and are otherwise negative except as noted above.  Physical Exam: Blood pressure 117/42, pulse 35, resp. rate 17, SpO2 99.00%.    General: Elderly, well developed, well nourished, in no acute distress. Head: Normocephalic, atraumatic, sclera non-icteric, no xanthomas, nares are without discharge.  Neck: Negative for carotid bruits. JVD not  elevated. Lungs: Clear bilaterally to auscultation without wheezes, rales, or rhonchi. Breathing is unlabored. Heart: Bradycardic, regular rhythm, with S1 S2. No murmurs, rubs, or gallops appreciated. Abdomen: Soft, non-tender, non-distended with normoactive bowel sounds. No hepatomegaly. No rebound/guarding. No obvious abdominal masses. Msk:  Strength and tone appears normal for age. Extremities: No clubbing, cyanosis or edema.  Distal pedal pulses are 2+ and equal bilaterally. Neuro: Alert and oriented X 3. Moves all extremities spontaneously. Psych:  Responds to questions appropriately with a normal affect.  Labs: Recent Labs  [facility] 07/05/12 1624   WBC 7.2   HGB 12.3   HCT 36.7   MCV 86.4   PLT 231   Lab 07/05/12 1624  NA 136  K 3.9  CL 100  CO2 22  BUN 20  CREATININE 0.98  CALCIUM 9.2  PROT 6.4  BILITOT 1.0  ALKPHOS 83  ALT 12  AST 17  AMYLASE --  LIPASE --  GLUCOSE 169*   Recent Labs  Basename 07/05/12 1624   CKTOTAL --   CKMB --   CKMBINDEX --   TROPONINI <0.30   Radiology/Studies: No results found.  EKG: sinus bradycardia, 36 bpm, evidence of questionable U waves, intervals WNL  ASSESSMENT:   1. Sinus bradycardia 2. Hypothyroidism 3. CAD 4. Chronic diastolic CHF 5. HTN 6. Type 2 DM 7. PVD  DISCUSSION/PLAN:   Patient presents with 3 weeks of worsening fatigue and weakness. Of concern is this episode of syncope during this time. Will need to monitor on telemetry. She does have a history of hypothyroidism on Synthroid. Recent TSH in 06/23/12 returned at 24. This will need to be better controlled and is likely contributing to her underlying bradycardia. She is normotensive, hemodynamically stable and asymptomatic otherwise in the ED. No evidence of unstable angina during this time. Does not endorse heart failure symptoms. She is euvolemic on exam. Troponin WNL. Cycle cardiac biomarkers and will monitor on telemetry. Hold off on rate-control agents.  Hypothyroidism and chronic comorbidity management by the primary team.  Signed, R. Valeria Batman, PA-C 07/05/2012, 6:33 PM   Attending note:  Patient seen and examined. Reviewed records and database as recorded by Mr. Arguello. Ms. Dipirro presents in referral from office visit with Dr. Linna Darner at which time severe bradycardia was noted. Patient states that she has been fatigued and weak over the last month, had an episode where she fell and briefly passed out while walking to her bathroom approximately one week ago. She was noted to have a heart rate in the 30s, confirmed on telemetry in the ER with some  junctional beats but baseline sinus bradycardia noted. ECG also confirms this with normal PR interval and QRS of 100 ms. No clear evidence of pauses or high-grade heart block seen. She denies any chest pain.  Blood pressure has been elevated during observation in the ER. She is otherwise comfortable with clear lung fields, slow rate on cardiac exam with soft murmur and no gallop, no pitting edema. Lab work shows potassium 3.9, BUN 20, creatinine 0.9, initial troponin I less than 0.30, hemoglobin 12.3, platelets 231, TSH 24.3.  She is being admitted to the hospitalist service. Patient does have evidence of hypothyroidism, states that she has been compliant with Synthroid 88 mcg daily, followed by an endocrinologist. Although hypothyroidism is possible etiology for sinus bradycardia, is not entirely clear that this is a definite explanation. She reports symptoms over the last month, her heart rate was 57 when she saw Dr. Burt Knack back on 7/12, also had a recent episode of apparent syncope. Perhaps she has conduction system disease and pauses that have not yet been seen. She is not on any heart rate lowering medications.  Further management of hypothyroidism per primary team. From cardiac perspective would continue telemetry monitoring, followup ECG in the morning. She had an echocardiogram done in January  that revealed normal LV function. May need to ask for EP consultation if heart rate does not improve.  Satira Sark, M.D., F.A.C.C.

## 2012-07-05 NOTE — H&P (Addendum)
PCP:   Unice Cobble, MD   Chief Complaint:  Weakness, low heart rate.   HPI: This is an 78 year old female, with known history of CAD, s/p aortocoronary bypass surgery in 1982 with SVG to diagonal and LAD, SVG to OM, SVG to PDA, s/p aortocoronary bypass surgery in 2002 with LIMA to LAD, EF of 55% by Myoview in 2006, s/p MI in 1977 and 1982, type 2 diabetes mellitus with neuropathy, hyperlipidemia, myoclonic spasms, GERD/esophageal stricture, chronic constipation, hypothyroidism, degenerative joint disease. Patient complains of progressive weakness over the past on month, worse in the last 2 weeks. Lately, she has also been plagued by dizziness. She was seen by her PMD on 06/23/12, when TSH was found to be 24.32, up from a value of 11.93 on 11/12/11. According to patient, her Synthroid was increased from 44 mcg daily, by Dr Chalmers Cater, to 88 mcg daily, about a month and a half ago. She insists that she has been compliant. Today, felt so bad, that she called PMD, was seen in office, and HR found to be in 45s. She was referred to the ED.    Allergies:   Allergies  Allergen Reactions  . Codeine     REACTION: rash  . Morphine     REACTION: hives  . Penicillins     REACTION: itching/rash  . Sulfonamide Derivatives     REACTION: itching, rash  . Hydrocodone-Acetaminophen     REACTION: unspecified  . Meperidine Hcl     REACTION: itching  . Metformin     REACTION: nervous  . Metoclopramide Hcl     REACTION: unspecified  . Metoprolol Succinate     REACTION: nervous  . Moxifloxacin     REACTION: itching  . Oxycodone-Aspirin     REACTION: rash  . Pentazocine Lactate     REACTION: ? nausea  . Pioglitazone     REACTION: bloating  . Tramadol Hcl     REACTION: unspecified      Past Medical History  Diagnosis Date  . Thyroid disease   . Hyperlipidemia   . GERD (gastroesophageal reflux disease)   . Anxiety   . Diabetes mellitus   . Hypertension   . PVD (peripheral vascular disease)   .  Carotid bruit   . Other dysphagia   . Other esophagitis   . Hemorrhoid   . Other constipation   . Colon, diverticulosis   . Hiatal hernia   . Osteoarthrosis, unspecified whether generalized or localized, unspecified site   . Degenerative joint disease   . Esophageal stricture   . Unspecified hearing loss   . Diastolic dysfunction     Past Surgical History  Procedure Date  . Appendectomy   . Cholecystectomy   . Abdominal hysterectomy   . Oophorectomy     bilateral  . Coronary artery bypass graft     x4(1982),02-2002 CABG X2  . Back surgery 01-2000    Prior to Admission medications   Medication Sig Start Date End Date Taking? Authorizing Provider  aspirin 325 MG EC tablet Take 325 mg by mouth daily.     Yes Historical Provider, MD  Bepotastine Besilate 1.5 % SOLN Place 1 drop into both eyes daily as needed. For itchy eyes   Yes Historical Provider, MD  DULoxetine (CYMBALTA) 60 MG capsule Take 60 mg by mouth daily as needed. For foot pain 10/20/11 10/19/12 Yes Hendricks Limes, MD  glimepiride (AMARYL) 2 MG tablet Take 2 mg by mouth daily before breakfast.  04/02/11  Yes Hendricks Limes, MD  guanFACINE (TENEX) 1 MG tablet Take 1.5 mg by mouth at bedtime.    Yes Historical Provider, MD  levothyroxine (SYNTHROID, LEVOTHROID) 88 MCG tablet Take 88 mcg by mouth daily.   Yes Historical Provider, MD  loteprednol (ALREX) 0.2 % SUSP Place 1 drop into both eyes daily as needed. For itchy eyes   Yes Historical Provider, MD  metFORMIN (GLUCOPHAGE) 500 MG tablet Take 500 mg by mouth daily with breakfast.  04/07/11  Yes Hendricks Limes, MD  nitroGLYCERIN (NITROSTAT) 0.4 MG SL tablet Place 0.4 mg under the tongue every 5 (five) minutes as needed. For chest pain   Yes Historical Provider, MD  omeprazole (PRILOSEC OTC) 20 MG tablet Take 20 mg by mouth daily as needed. For reflux 12/17/11 12/16/12 Yes Irene Shipper, MD  traZODone (DESYREL) 50 MG tablet Take 150 mg by mouth at bedtime as needed.    Yes  Historical Provider, MD    Social History: Married, has 3 offspring. She reports that she has quit smoking in 1977. She has never used smokeless tobacco. She reports that she does not drink alcohol or use illicit drugs.  Family History  Problem Relation Age of Onset  . Heart attack Father   . Heart disease Sister   . Colon cancer Neg Hx     Review of Systems:  As per HPI and chief complaint. Patent has fatigue, diminished appetite, denies weight loss, fever, chills, headache, blurred vision, difficulty in speaking, dysphagia, chest pain, cough, shortness of breath, orthopnea, paroxysmal nocturnal dyspnea, nausea, diaphoresis, abdominal pain, vomiting, diarrhea, belching, heartburn, hematemesis, melena, dysuria, nocturia, urinary frequency, hematochezia, lower extremity swelling, pain, or redness. The rest of the systems review is negative.  Physical Exam:  General:  Patient does not appear to be in obvious acute distress. Alert, communicative, fully oriented, talking in complete sentences, not short of breath at rest.  HEENT:  No clinical pallor, no jaundice, no conjunctival injection or discharge. Hydration status is satisfactory.  NECK:  Supple, JVP not seen, no carotid bruits, no palpable lymphadenopathy, no palpable goiter. CHEST:  Clinically clear to auscultation, no wheezes, no crackles. HEART:  Sounds 1 and 2 heard, normal, regular, bradycardic, no murmurs. ABDOMEN:  Full, soft, non-tender, no palpable organomegaly, no palpable masses, normal bowel sounds. GENITALIA:  Not examined. LOWER EXTREMITIES:  No pitting edema, palpable peripheral pulses. MUSCULOSKELETAL SYSTEM:  Generalized osteoarthritic changes, otherwise, normal. CENTRAL NERVOUS SYSTEM:  No focal neurologic deficit on gross examination.  Labs on Admission:  Results for orders placed during the hospital encounter of 07/05/12 (from the past 48 hour(s))  CBC     Status: Normal   Collection Time   07/05/12  4:24 PM       Component Value Range Comment   WBC 7.2  4.0 - 10.5 K/uL    RBC 4.25  3.87 - 5.11 MIL/uL    Hemoglobin 12.3  12.0 - 15.0 g/dL    HCT 36.7  36.0 - 46.0 %    MCV 86.4  78.0 - 100.0 fL    MCH 28.9  26.0 - 34.0 pg    MCHC 33.5  30.0 - 36.0 g/dL    RDW 14.4  11.5 - 15.5 %    Platelets 231  150 - 400 K/uL   COMPREHENSIVE METABOLIC PANEL     Status: Abnormal   Collection Time   07/05/12  4:24 PM      Component Value Range Comment  Sodium 136  135 - 145 mEq/L    Potassium 3.9  3.5 - 5.1 mEq/L    Chloride 100  96 - 112 mEq/L    CO2 22  19 - 32 mEq/L    Glucose, Bld 169 (*) 70 - 99 mg/dL    BUN 20  6 - 23 mg/dL    Creatinine, Ser 0.98  0.50 - 1.10 mg/dL    Calcium 9.2  8.4 - 10.5 mg/dL    Total Protein 6.4  6.0 - 8.3 g/dL    Albumin 3.7  3.5 - 5.2 g/dL    AST 17  0 - 37 U/L    ALT 12  0 - 35 U/L    Alkaline Phosphatase 83  39 - 117 U/L    Total Bilirubin 1.0  0.3 - 1.2 mg/dL    GFR calc non Af Amer 53 (*) >90 mL/min    GFR calc Af Amer 61 (*) >90 mL/min   TROPONIN I     Status: Normal   Collection Time   07/05/12  4:24 PM      Component Value Range Comment   Troponin I <0.30  <0.30 ng/mL     Radiological Exams on Admission: No results found.  Assessment/Plan Active Problems:  1. Weakness generalized: Patient has had progressive weakness and fatigue, for at least one month. This is likely attributable to her profound hypothyroidism, and should respond to adequate replacement therapy. For completeness, we shall also check cortisol levels.   2. Hypothyroidism: This is the likely culprit, for patient's presenting problems. She was seen by her PMD on 06/23/12, when TSH was found to be 24.32, up from a value of 11.93 on 11/12/11. We shall increase Synthroid dose to 100 microgram, with a view to further increase, after one week. More rapid escalation is not adviseable, in view of her age and history of CAD.  3. Bradycardia: Patient presented with a significant bradycardia, with HR of 34-44.  She has been symptomatic of late, with dizziness, but denies chest pain or shortness of breath. Hypothyroidism is of course contributory, but there is also concern for sino-artrial disease. Dr Domenic Polite, cardiologist, has been consulted, in case a pacemaker proves necessary. Meanwhile, will cycle cardiac enzymes. 4. Diabetes Mellitus: This is type 2, and appears sub-optimally controlled, based on random blood glucose. We shall hold oral hypoglycemics for now, and manage with diet/SSI.  5. HTN (hypertension): BP is borderline, so will observe only for now, and avoid antihypertensives.  6. CAD (coronary artery disease): Patient has a known history of CAD, s/p aortocoronary bypass surgery in 1982 with SVG to diagonal and LAD, SVG to OM, SVG to PDA, s/p aortocoronary bypass surgery in 2002 with LIMA to LAD, EF of 55% by Myoview in 2006, s/p MI in 1977 and 1982. She has no chest pain or dyspnea at this ime, so doubt ACS.  7. Hyperlipidemia: We shall check lipid profile.  8. GERD (gastroesophageal reflux disease): Appears asymptomatic at this time, on PPI.   Further management will depend on clinical course.    Time Spent on Admission: 45 mins.   Janet Steele,CHRISTOPHER 07/05/2012, 6:31 PM

## 2012-07-05 NOTE — ED Notes (Signed)
Pt denies complaints at this tie report called pt ready for transport to 2600

## 2012-07-05 NOTE — ED Provider Notes (Signed)
History     CSN: [id number]  Arrival date & time 07/05/12  37   First MD Initiated Contact with Patient 07/05/12 1501      Chief Complaint  Patient presents with  . Bradycardia  . Weakness  . Dizziness    (Consider location/radiation/quality/duration/timing/severity/associated sxs/prior treatment) Patient is a 78 y.o. female presenting with general illness. The history is provided by the patient and medical records.  Illness  The current episode started more than 1 week ago. The onset was gradual. The problem occurs continuously. The problem has been unchanged. The problem is moderate. Nothing relieves the symptoms. Nothing aggravates the symptoms. Pertinent negatives include no fever, no double vision, no photophobia, no abdominal pain, no nausea, no vomiting, no congestion, no headaches, no rhinorrhea, no sore throat, no cough, no URI and no rash. Recently, medical care has been given by the PCP. Services received include tests performed.    Past Medical History  Diagnosis Date  . Thyroid disease   . Hyperlipidemia   . GERD (gastroesophageal reflux disease)   . Anxiety   . Diabetes mellitus   . Hypertension   . PVD (peripheral vascular disease)   . Carotid bruit   . Other dysphagia   . Other esophagitis   . Hemorrhoid   . Other constipation   . Colon, diverticulosis   . Hiatal hernia   . Osteoarthrosis, unspecified whether generalized or localized, unspecified site   . Degenerative joint disease   . Esophageal stricture   . Unspecified hearing loss   . Diastolic dysfunction     Past Surgical History  Procedure Date  . Appendectomy   . Cholecystectomy   . Abdominal hysterectomy   . Oophorectomy     bilateral  . Coronary artery bypass graft     x4(1982),02-2002 CABG X2  . Back surgery 01-2000    Family History  Problem Relation Age of Onset  . Heart attack Father   . Heart disease Sister   . Colon cancer Neg Hx     History  Substance Use Topics  .  Smoking status: Former Research scientist (life sciences)  . Smokeless tobacco: Never Used   Comment: Quit in 1977   . Alcohol Use: No    OB History    Grav Para Term Preterm Abortions TAB SAB Ect Mult Living                  Review of Systems  Constitutional: Positive for fatigue. Negative for fever, chills and diaphoresis.  HENT: Negative for congestion, sore throat and rhinorrhea.   Eyes: Negative for double vision and photophobia.  Respiratory: Negative for cough and shortness of breath.   Cardiovascular: Negative for chest pain and palpitations.  Gastrointestinal: Negative for nausea, vomiting and abdominal pain.  Genitourinary: Negative for dysuria.  Musculoskeletal: Negative for myalgias, back pain and arthralgias.  Skin: Negative for color change and rash.  Neurological: Positive for dizziness. Negative for light-headedness and headaches.  Psychiatric/Behavioral: Negative for confusion.  All other systems reviewed and are negative.    Allergies  Codeine; Morphine; Penicillins; Sulfonamide derivatives; Hydrocodone-acetaminophen; Meperidine hcl; Metformin; Metoclopramide hcl; Metoprolol succinate; Moxifloxacin; Oxycodone-aspirin; Pentazocine lactate; Pioglitazone; and Tramadol hcl  Home Medications   Current Outpatient Rx  Name Route Sig Dispense Refill  . ASPIRIN 325 MG PO TBEC Oral Take 325 mg by mouth daily.      . DULOXETINE HCL 60 MG PO CPEP Oral Take 1 capsule (60 mg total) by mouth as needed. 30 capsule 6  .  GLIMEPIRIDE 2 MG PO TABS Oral Take 2 mg by mouth daily before breakfast.     . GLUCOSE BLOOD VI STRP Other 1 each by Other route as needed. Use as instructed     . GUANFACINE HCL 1 MG PO TABS Oral Take 1 mg by mouth. 1 1/2 Pills by mouth at bedtime (RX'ed by Dr.Love)    . LEVOTHYROXINE SODIUM 150 MCG PO TABS Oral Take 150 mcg by mouth daily.    Marland Kitchen LOTEPREDNOL ETABONATE 0.2 % OP SUSP  1 drop at bedtime.    Marland Kitchen METFORMIN HCL 500 MG PO TABS Oral Take 500 mg by mouth daily with breakfast.       . NITROGLYCERIN 0.4 MG SL SUBL Sublingual Place 0.4 mg under the tongue every 5 (five) minutes as needed.      Marland Kitchen OMEPRAZOLE MAGNESIUM 20 MG PO TBEC Oral Take 1 tablet (20 mg total) by mouth daily. 28 tablet 1    Multiple samples of Prilosec given to patient  . TRAZODONE HCL 50 MG PO TABS Oral Take 150 mg by mouth at bedtime as needed.       BP 132/44  Pulse 44  Physical Exam  Nursing note and vitals reviewed. Constitutional: She is oriented to person, place, and time. She appears well-developed and well-nourished.  HENT:  Head: Normocephalic and atraumatic.  Eyes: EOM are normal. Pupils are equal, round, and reactive to light.  Neck: No thyromegaly present.  Cardiovascular: Regular rhythm, normal heart sounds and intact distal pulses.  Bradycardia present.   Pulmonary/Chest: Effort normal and breath sounds normal. No respiratory distress.  Abdominal: Soft. She exhibits no distension. There is no tenderness.  Musculoskeletal: She exhibits no edema (arms or legs).  Neurological: She is alert and oriented to person, place, and time. She has normal strength. No cranial nerve deficit or sensory deficit. Coordination normal. GCS eye subscore is 4. GCS verbal subscore is 5. GCS motor subscore is 6.  Reflex Scores:      Bicep reflexes are 2+ on the right side and 2+ on the left side.      Patellar reflexes are 2+ on the right side and 2+ on the left side. Skin: Skin is warm and dry. No rash noted.  Psychiatric: She has a normal mood and affect.    ED Course  Procedures (including critical care time)  Labs Reviewed  COMPREHENSIVE METABOLIC PANEL - Abnormal; Notable for the following:    Glucose, Bld 169 (*)     GFR calc non Af Amer 53 (*)     GFR calc Af Amer 61 (*)     All other components within normal limits   No results found.   Date: 07/05/2012  Rate: 36  Rhythm: sinus bradycardia  QRS Axis: right  Intervals: normal  ST/T Wave abnormalities: nonspecific ST/T changes ST  depressions V5, V6, II, TWI III, aVF  Conduction Disutrbances:none  Narrative Interpretation:   Old EKG Reviewed: changes noted new bradycardia; similar ST/T changes    1. Weakness generalized   2. Bradycardia   3. Diabetes mellitus   4. Hypothyroidism   5. CAD (coronary artery disease)   6. HTN (hypertension)       MDM  Female who presents today from her PCPs office for bradycardia. The patient states that for the past week or so she has been feeling generalized fatigue and weakness. She also endorses feeling a spinning sensation when she is standing, however review of records indicate that she has been  previously evaluated several months ago for postural dizziness. She states that this has not changed recently, and has no feelings of dizziness when at rest. She does note that about one week ago she had a syncopal episode which occurred just after she got up to walk to the bathroom; she was not seen at that point in time. She denies any recent infectious complaints, no chest pain, no shortness of breath, no focal symptoms. She saw her regular doctor today, and is happy bradycardic in the 30s, so was sent here for further evaluation. Of note the patient does have a history of hypothyroidism, but has not had a recent change in her medication; review of labs show that this has previously been poorly controlled. The patient is well-appearing on exam, no signs of inducible nystagmus or vertigo. She is currently bradycardic, but the remainder of her vital signs are unremarkable. We'll check lab work to evaluate for possible underlying etiology, the patient will require admission.       Marcelino Scot, MD 07/06/12 0130

## 2012-07-05 NOTE — Patient Instructions (Addendum)
TSH (Thyroid Stimulating Hormone) normal range = 0.35- 5.50. Ideal value is 1-3. A  Value below 0.35 indicates excessive thyroid function or excessive thyroid medication supplementation (HYPERthyroid state) & a value > 5.50 indicates suboptimal thyroid function (HYPOthyroid state) . Your present value of 24.32 suggested severe hypothyroidism. It is critical to verify the actual  dose of thyroid medication and prescribing physician. Repeat the isometric exercises discussed 4- 5 times prior to standing if you've been seated for a period of time.

## 2012-07-05 NOTE — ED Notes (Signed)
Pt given a cup of ice water per RN

## 2012-07-06 ENCOUNTER — Encounter (HOSPITAL_COMMUNITY): Payer: Self-pay | Admitting: Internal Medicine

## 2012-07-06 ENCOUNTER — Encounter (HOSPITAL_COMMUNITY)
Admission: EM | Disposition: A | Discharge status: Home / Self Care | Payer: Self-pay | Source: Home / Self Care | Attending: Internal Medicine

## 2012-07-06 ENCOUNTER — Encounter: Payer: Self-pay | Admitting: Cardiovascular Disease

## 2012-07-06 DIAGNOSIS — R259 Unspecified abnormal involuntary movements: Secondary | ICD-10-CM | POA: Diagnosis not present

## 2012-07-06 DIAGNOSIS — I25810 Atherosclerosis of coronary artery bypass graft(s) without angina pectoris: Secondary | ICD-10-CM

## 2012-07-06 DIAGNOSIS — I498 Other specified cardiac arrhythmias: Secondary | ICD-10-CM

## 2012-07-06 DIAGNOSIS — R5383 Other fatigue: Secondary | ICD-10-CM | POA: Diagnosis not present

## 2012-07-06 HISTORY — PX: PERMANENT PACEMAKER INSERTION: SHX5480

## 2012-07-06 LAB — COMPREHENSIVE METABOLIC PANEL
ALT: 11 U/L (ref 0–35)
Alkaline Phosphatase: 81 U/L (ref 39–117)
BUN: 19 mg/dL (ref 6–23)
Chloride: 102 mEq/L (ref 96–112)
GFR calc Af Amer: 76 mL/min — ABNORMAL LOW (ref >90)
Glucose, Bld: 113 mg/dL — ABNORMAL HIGH (ref 70–99)
Potassium: 3.4 mEq/L — ABNORMAL LOW (ref 3.5–5.1)
Sodium: 138 mEq/L (ref 135–145)
Total Bilirubin: 0.8 mg/dL (ref 0.3–1.2)
Total Protein: 6.2 g/dL (ref 6.0–8.3)

## 2012-07-06 LAB — HEMOGLOBIN A1C
Hgb A1c MFr Bld: 7.1 % — ABNORMAL HIGH (ref <5.7)
Mean Plasma Glucose: 157 mg/dL — ABNORMAL HIGH (ref <117)

## 2012-07-06 LAB — CARDIAC PANEL(CRET KIN+CKTOT+MB+TROPI)
CK, MB: 3.9 ng/mL (ref 0.3–4.0)
Relative Index: 3.6 — ABNORMAL HIGH (ref 0.0–2.5)
Relative Index: 4 — ABNORMAL HIGH (ref 0.0–2.5)
Troponin I: <0.3 ng/mL (ref <0.30)

## 2012-07-06 LAB — CBC
HCT: 37.6 % (ref 36.0–46.0)
Hemoglobin: 13.2 g/dL (ref 12.0–15.0)
MCHC: 35.1 g/dL (ref 30.0–36.0)
RBC: 4.4 MIL/uL (ref 3.87–5.11)
WBC: 8.1 10*3/uL (ref 4.0–10.5)

## 2012-07-06 LAB — SODIUM, URINE, RANDOM: Sodium, Ur: 103 mEq/L

## 2012-07-06 LAB — GLUCOSE, CAPILLARY: Glucose-Capillary: 148 mg/dL — ABNORMAL HIGH (ref 70–99)

## 2012-07-06 SURGERY — PERMANENT PACEMAKER INSERTION
Anesthesia: LOCAL

## 2012-07-06 MED ORDER — SODIUM CHLORIDE 0.9 % IJ SOLN: 3.0000 mL | Freq: Two times a day (BID) | INTRAMUSCULAR | Status: DC

## 2012-07-06 MED ORDER — DIPHENHYDRAMINE HCL 50 MG/ML IJ SOLN
12.5000 mg | Freq: Once | INTRAMUSCULAR | Status: AC
  Administered 2012-07-06: 12.5 mg via INTRAVENOUS

## 2012-07-06 MED ORDER — POTASSIUM CHLORIDE CRYS ER 20 MEQ PO TBCR
40.0000 meq | EXTENDED_RELEASE_TABLET | Freq: Once | ORAL | Status: AC
  Administered 2012-07-06: 40 meq via ORAL
  Filled 2012-07-06: qty 2

## 2012-07-06 MED ORDER — SODIUM CHLORIDE 0.9 % IJ SOLN: 3.0000 mL | INTRAMUSCULAR | Status: DC | PRN

## 2012-07-06 MED ORDER — GLIMEPIRIDE 2 MG PO TABS
2.0000 mg | ORAL_TABLET | Freq: Every day | ORAL | Status: DC
  Administered 2012-07-07: 08:00:00 2 mg via ORAL
  Filled 2012-07-06 (×2): qty 1

## 2012-07-06 MED ORDER — CHLORHEXIDINE GLUCONATE 4 % EX LIQD
60.0000 mL | Freq: Once | CUTANEOUS | Status: DC
  Filled 2012-07-06: qty 60

## 2012-07-06 MED ORDER — SODIUM CHLORIDE 0.9 % IV SOLN: 250.0000 mL | INTRAVENOUS | Status: DC | PRN

## 2012-07-06 MED ORDER — SODIUM CHLORIDE 0.9 % IR SOLN
80.0000 mg | Status: DC
  Filled 2012-07-06: qty 2

## 2012-07-06 MED ORDER — VANCOMYCIN HCL IN DEXTROSE 1-5 GM/200ML-% IV SOLN
1000.0000 mg | INTRAVENOUS | Status: DC
  Filled 2012-07-06: qty 200

## 2012-07-06 MED ORDER — MIDAZOLAM HCL 5 MG/5ML IJ SOLN
INTRAMUSCULAR | Status: AC
  Filled 2012-07-06: qty 5

## 2012-07-06 MED ORDER — DIPHENHYDRAMINE HCL 50 MG/ML IJ SOLN
INTRAMUSCULAR | Status: AC
  Filled 2012-07-06: qty 1

## 2012-07-06 MED ORDER — SODIUM CHLORIDE 0.45 % IV SOLN
INTRAVENOUS | Status: DC
  Administered 2012-07-06: 10 mL/h via INTRAVENOUS

## 2012-07-06 MED ORDER — SODIUM CHLORIDE 0.9 % IV SOLN
INTRAVENOUS | Status: DC
  Administered 2012-07-06: 50 mL/h via INTRAVENOUS

## 2012-07-06 MED ORDER — ATORVASTATIN CALCIUM 10 MG PO TABS
10.0000 mg | ORAL_TABLET | Freq: Every day | ORAL | Status: DC
  Filled 2012-07-06: qty 1

## 2012-07-06 MED ORDER — VANCOMYCIN HCL IN DEXTROSE 1-5 GM/200ML-% IV SOLN
1000.0000 mg | Freq: Two times a day (BID) | INTRAVENOUS | Status: AC
  Administered 2012-07-06: 19:00:00 1000 mg via INTRAVENOUS
  Filled 2012-07-06: qty 200

## 2012-07-06 MED ORDER — LIDOCAINE HCL (PF) 1 % IJ SOLN
INTRAMUSCULAR | Status: AC
  Filled 2012-07-06: qty 60

## 2012-07-06 MED ORDER — FENTANYL CITRATE 0.05 MG/ML IJ SOLN
INTRAMUSCULAR | Status: AC
  Filled 2012-07-06: qty 2

## 2012-07-06 MED ORDER — ONDANSETRON HCL 4 MG/2ML IJ SOLN: 4.0000 mg | Freq: Four times a day (QID) | INTRAMUSCULAR | Status: DC | PRN

## 2012-07-06 MED ORDER — ZOLPIDEM TARTRATE 5 MG PO TABS
5.0000 mg | ORAL_TABLET | Freq: Every evening | ORAL | Status: DC | PRN
  Administered 2012-07-06: 23:00:00 5 mg via ORAL
  Filled 2012-07-06: qty 1

## 2012-07-06 MED ORDER — DULOXETINE HCL 60 MG PO CPEP
60.0000 mg | ORAL_CAPSULE | Freq: Every day | ORAL | Status: DC
  Administered 2012-07-06 – 2012-07-07 (×2): 60 mg via ORAL
  Filled 2012-07-06 (×2): qty 1

## 2012-07-06 MED ORDER — ACETAMINOPHEN 325 MG PO TABS
325.0000 mg | ORAL_TABLET | ORAL | Status: DC | PRN
  Administered 2012-07-06 – 2012-07-07 (×3): 650 mg via ORAL
  Filled 2012-07-06 (×2): qty 2

## 2012-07-06 NOTE — Care Management Note (Signed)
  Page 1 of 1   07/06/2012     12:42:11 PM   CARE MANAGEMENT NOTE 07/06/2012  Patient:  Janet Steele, Janet Steele   Account Number:  [id number]  Date Initiated:  07/06/2012  Documentation initiated by:  Felix Ahmadi Assessment:   78 yr-old female adm with dx of bradycardia; lives with spouse.     DC Planning Services  CM consult       07/06/12 1100 Fraser Busche RN MSN CCM Per pt, she and spouse manage without difficulty, are both independent and have supportive son in the area as well. States she has Medicare Part D plan for meds and can afford copays.  No d/c needs identified.

## 2012-07-06 NOTE — Telephone Encounter (Signed)
[facility] at [facility] , husband wants to talk with cooper re being told she needs a pacemaker

## 2012-07-06 NOTE — Progress Notes (Signed)
Utilization Review Completed.  Janet Steele, [place] T 07/06/2012

## 2012-07-06 NOTE — Progress Notes (Signed)
Patient Name: Janet Steele Date of Encounter: 07/06/2012  Active Problems:  Weakness generalized  Hypothyroidism  Bradycardia  HTN (hypertension)  CAD (coronary artery disease)  Hyperlipidemia  GERD (gastroesophageal reflux disease)  Diabetes mellitus    SUBJECTIVE: No more dizziness or presyncope. No chest pain.  OBJECTIVE Filed Vitals:   07/05/12 2100 07/05/12 2308 07/06/12 0311 07/06/12 0747  BP: 140/55 127/40 133/48 117/43  Pulse: 50 46 49 33  Temp:  98.1 F (36.7 C) 98 F (36.7 C) 97.7 F (36.5 C)  TempSrc:  Oral Oral Oral  Resp: 18 19 17 13   Height:      Weight:      SpO2: 100% 96% 97% 97%    Intake/Output Summary (Last 24 hours) at 07/06/12 0919 Last data filed at 07/06/12 [id number]  Gross per 24 hour  Intake    549 ml  Output      0 ml  Net    549 ml   Weight change:  Filed Weights   07/05/12 2011  Weight: 133 lb 2.5 oz (60.4 kg)     PHYSICAL EXAM General: Well developed, well nourished, female in no acute distress. Head: Normocephalic, atraumatic.  Neck: Supple without bruits, JVD not elevated. Lungs:  Resp regular and unlabored, CTA bilaterally. Heart: RRR, S1, S2, no S3, S4, 2/6 systolic murmur. Abdomen: Soft, non-tender, non-distended, BS + x 4.  Extremities: No clubbing, cyanosis, no edema.  Neuro: Alert and oriented X 3. Moves all extremities spontaneously. Psych: Normal affect.  LABS: CBC: Basename 07/06/12 0305 07/05/12 1934  WBC 8.1 8.1  NEUTROABS -- --  HGB 13.2 13.5  HCT 37.6 39.6  MCV 85.5 86.1  PLT 237 252   INR:No results found for this basename: INR in the last 72 hours Basic Metabolic Panel: Basename [id number] 0305 07/05/12 1934 07/05/12 1624  NA 138 -- 136  K 3.4* -- 3.9  CL 102 -- 100  CO2 24 -- 22  GLUCOSE 113* -- 169*  BUN 19 -- 20  CREATININE 0.82 0.84 --  CALCIUM 9.2 -- 9.2  MG -- 2.1 --  PHOS -- -- --   Liver Function Tests: Basename 07/06/12 0305 07/05/12 1624  AST 16 17  ALT 11 12  ALKPHOS 81 83    BILITOT 0.8 1.0  PROT 6.2 6.4  ALBUMIN 3.5 3.7   Cardiac Enzymes: Basename 07/06/12 0305 07/05/12 1933 07/05/12 1624  CKTOTAL 109 120 --  CKMB 3.9 3.9 --  CKMBINDEX -- -- --  TROPONINI <0.30 <0.30 <0.30   Hemoglobin A1C: Basename 07/05/12 1934  HGBA1C 7.1*   Fasting Lipid Panel: Basename 07/05/12 1934  CHOL 332*  HDL 32*  LDLCALC 234*  TRIG 331*  CHOLHDL 10.4  LDLDIRECT --   Thyroid Function Tests: Basename 07/05/12 1624  TSH 25.018*  T4TOTAL 6.0  T3FREE --  THYROIDAB --    TELE: SR, S brady - frequently in the 30s, once to 29; possible 2nd degree block at times but there are not clearly extra p waves.  ECG: 06-Jul-2012 08:08:53  Marked sinus bradycardia Rightward axis Cannot rule out Inferior infarct , age undetermined Abnormal ECG Vent. rate 32 BPM PR interval 164 ms QRS duration 98 ms QT/QTc 536/391 ms P-R-T axes 84 91 -49   Current Medications:   . aspirin  325 mg Oral Daily  . guanFACINE  1 mg Oral QHS  . heparin  5,000 Units Subcutaneous Q8H  . insulin aspart  0-9 Units Subcutaneous TID WC  . levothyroxine  100 mcg Oral QAC breakfast  . pantoprazole  40 mg Oral Q0600  . sodium chloride  3 mL Intravenous Q12H  . DISCONTD: guanFACINE  1.5 mg Oral QHS      ASSESSMENT AND PLAN: Bradycardia - may be cause of weakness. Will have EP eval for possible PPM. Keep NPO in case can do this pm.  Hypokalemia - 40 meq x 1, otherwise per primary MD.  Otherwise, per primary MD. Active Problems:  Weakness generalized  Hypothyroidism  Bradycardia  HTN (hypertension)  CAD (coronary artery disease)  Hyperlipidemia  GERD (gastroesophageal reflux disease)  Diabetes mellitus   Signed, Rosaria Ferries , PA-C 9:19 AM 07/06/2012  Patient seen, examined. Available data reviewed. Agree with findings, assessment, and plan as outlined by Rosaria Ferries, PA-C. Pt with symptomatic bradycardia. Fairly major change in heart rate compared to her baseline with heart  rate in 30's or 40's. I would for her to have an EP evaluation for consideration of permanent pacing considering her known, extensive CAD, and multitude of symptoms that may be related to bradycardia. Major question regarding her thyroid status with elevated TSH and normal fT4. Appreciate Dr Jackalyn Lombard consult in advance.  Sherren Mocha, M.D. 07/06/2012 2:12 PM

## 2012-07-06 NOTE — Plan of Care (Signed)
Problem: Phase II Progression Outcomes Goal: Progress activity as tolerated unless otherwise ordered Outcome: Progressing Pt able to ambulate to bathroom and perform hygiene. Pt reported dizziness however vital signs were stable during hygiene activities.

## 2012-07-06 NOTE — Progress Notes (Addendum)
Pt transferred for permanent pace maker procedure.  Pt vital signs prior to transport were: HR 41, BP 132/39 (62), Oxygen Sats 98%.  Pt was transported with 0.45 NS and 0.9 NS IV fluids infusing.  Pt's son Coralyn Mark is traveling with pt, and has her lower dentures.  Per Dr. Rayann Heman, pt will proceed with procedure without having PT/INR results available.  Consent has been obtained and the procedure was explained to the pt by Dr. Rayann Heman.  Pt is informed however anxious with son at bedside.

## 2012-07-06 NOTE — Consult Note (Signed)
ELECTROPHYSIOLOGY CONSULT NOTE     Primary Care Physician: Unice Cobble, MD Referring Physician:  Dr Burt Knack  Admit Date: 07/05/2012  Reason for consultation: symptomatic bradycardia  Janet Steele is a 78 y.o. female with a h/o CAD s/p CABG, bradycardia, and hypothyroidism who presents with symptomatic bradycardia.  He reports progressive dizziness and fatigue for several weeks.  She reports that two weeks ago that she passed out while walking to the bathroom at 2am.  She has been evaluated by Dr Burt Knack previously and has had all chronotropic agents held.  She is felt to require beta blockers long term for management of CAD.   She has also had longstanding difficulty with management of hypothyroidism.  I spoke with Dr Chalmers Cater today who knows the patient very well.  She checked the patients TSH 04/26/12 and found it to be 1.1 at that time.  This admit, the TSH is now 24.  Per Dr Chalmers Cater, the patient has had similar fluctuations in her TFTs in the past which is felt to be predominantly due to compliance/ adherance.  Today, she denies symptoms of palpitations, chest pain, shortness of breath, orthopnea, PND, lower extremity edema, or neurologic sequela. The patient is tolerating medications without difficulties and is otherwise without complaint today.   Past Medical History  Diagnosis Date  . Hypothyroidism   . Hyperlipidemia   . GERD (gastroesophageal reflux disease)   . Anxiety   . Diabetes mellitus   . Hypertension   . PVD (peripheral vascular disease)   . Carotid bruit   . Other dysphagia   . Other esophagitis   . Hemorrhoid   . Other constipation   . Colon, diverticulosis   . Hiatal hernia   . Osteoarthrosis, unspecified whether generalized or localized, unspecified site   . Degenerative joint disease   . Esophageal stricture   . Unspecified hearing loss   . Diastolic dysfunction   . CAD (coronary artery disease)    Past Surgical History  Procedure Date  . Appendectomy   .  Cholecystectomy   . Abdominal hysterectomy   . Oophorectomy     bilateral  . Coronary artery bypass graft     x4(1982),02-2002 CABG X2  . Back surgery 01-2000       . aspirin  325 mg Oral Daily  . chlorhexidine  60 mL Topical Once  . gentamicin irrigation  80 mg Irrigation On Call  . guanFACINE  1 mg Oral QHS  . insulin aspart  0-9 Units Subcutaneous TID WC  . levothyroxine  100 mcg Oral QAC breakfast  . pantoprazole  40 mg Oral Q0600  . sodium chloride  3 mL Intravenous Q12H  . vancomycin  1,000 mg Intravenous On Call  . DISCONTD: guanFACINE  1.5 mg Oral QHS  . DISCONTD: heparin  5,000 Units Subcutaneous Q8H      . sodium chloride 10 mL/hr (07/06/12 1545)  . sodium chloride 50 mL/hr (07/06/12 1549)    Allergies  Allergen Reactions  . Codeine     REACTION: rash  . Morphine     REACTION: hives  . Penicillins     REACTION: itching/rash  . Sulfonamide Derivatives     REACTION: itching, rash  . Hydrocodone-Acetaminophen     REACTION: unspecified  . Meperidine Hcl     REACTION: itching  . Metformin     REACTION: nervous  . Metoclopramide Hcl     REACTION: unspecified  . Metoprolol Succinate     REACTION: nervous  . Moxifloxacin  REACTION: itching  . Oxycodone-Aspirin     REACTION: rash  . Pentazocine Lactate     REACTION: ? nausea  . Pioglitazone     REACTION: bloating  . Tramadol Hcl     REACTION: unspecified    History   Social History  . Marital Status: Married    Spouse Name: N/A    Number of Children: N/A  . Years of Education: N/A   Occupational History  . Retired     Social History Main Topics  . Smoking status: Former Smoker    Types: Cigarettes    Quit date: 07/05/1976  . Smokeless tobacco: Never Used   Comment: Quit in 1977   . Alcohol Use: 2.4 oz/week    4 Glasses of wine per week  . Drug Use: No  . Sexually Active: No   Other Topics Concern  . Not on file   Social History Narrative  . No narrative on file    Family  History  Problem Relation Age of Onset  . Heart attack Father   . Heart disease Sister   . Colon cancer Neg Hx     ROS- All systems are reviewed and negative except as per the HPI above  Physical Exam: Telemetry: Filed Vitals:   07/06/12 1200 07/06/12 1327 07/06/12 1329 07/06/12 1331  BP: 104/36 126/46 124/43 115/45  Pulse: 41 43 42   Temp: 97.7 F (36.5 C)     TempSrc: Oral     Resp: 19 21 19    Height:      Weight:      SpO2: 95%       GEN- The patient is elderly appearing, alert and oriented x 3 today.   Head- normocephalic, atraumatic Eyes-  Sclera clear, conjunctiva pink Ears- hearing intact Oropharynx- clear Neck- supple,  Lungs- Clear to ausculation bilaterally, normal work of breathing Heart- brady regular rhythm GI- soft, NT, ND, + BS Extremities- no clubbing, cyanosis, or edema MS- age appropriate atrophy Skin- no rash or lesion Psych- euthymic mood, full affect Neuro- strength and sensation are intact  EKG-  Sinus bradycardia  Labs:   Lab Results  Component Value Date   WBC 8.1 07/06/2012   HGB 13.2 07/06/2012   HCT 37.6 07/06/2012   MCV 85.5 07/06/2012   PLT 237 07/06/2012    Lab 07/06/12 0305  NA 138  K 3.4*  CL 102  CO2 24  BUN 19  CREATININE 0.82  CALCIUM 9.2  PROT 6.2  BILITOT 0.8  ALKPHOS 81  ALT 11  AST 16  GLUCOSE 113*   Lab Results  Component Value Date   CKTOTAL 101 07/06/2012   CKMB 4.0 07/06/2012   TROPONINI <0.30 07/06/2012    Lab Results  Component Value Date   CHOL 332* 07/05/2012   CHOL 166 06/18/2011   CHOL 165 02/23/2010   Lab Results  Component Value Date   HDL 32* 07/05/2012   HDL 51.00 06/18/2011   HDL 51.70 02/23/2010   Lab Results  Component Value Date   LDLCALC 234* 07/05/2012   LDLCALC 90 06/18/2011   LDLCALC 113* 09/07/2007   Lab Results  Component Value Date   TRIG 331* 07/05/2012   TRIG 127.0 06/18/2011   TRIG 206.0* 02/23/2010   Lab Results  Component Value Date   CHOLHDL 10.4 07/05/2012   CHOLHDL 3 06/18/2011    CHOLHDL 3 02/23/2010   Lab Results  Component Value Date   LDLDIRECT 98.6 02/23/2010   LDLDIRECT 116.8 11/12/2009  LDLDIRECT 93.9 07/17/2008     ASSESSMENT AND PLAN:    1. Bradycardia The patient presents with clearly symptomatic bradycardia.   She is on no chronotropic agents.  She has longstanding hypothyroidism with adherence to medicine issues which has made treatment very difficult.  I have spoken at length with Dr Chalmers Cater, Dr Burt Knack, and also the hospitalist staff.  The patient is felt to require beta blockers long term for management of her CAD.  In addition, Dr Chalmers Cater has recommended that I proceed with pacemaker implant as her hypothyroidism is longstanding and therapy has been quite limited.  The patient has symptomatic bradycardia.  I would therefore recommend pacemaker implantation at this time.  Risks, benefits, alternatives to pacemaker implantation were discussed in detail with the patient today. The patient understands that the risks include but are not limited to bleeding, infection, pneumothorax, perforation, tamponade, vascular damage, renal failure, MI, stroke, death,  and lead dislodgement and wishes to proceed. We will therefore schedule the procedure at the next available time.  2. Hypothyroidism Per Dr Chalmers Cater, we should continue synthroid 88 mcg daily.  This dose should not be increased and compliance should be encouraged.  I have passed this message on to the hospitalist team.  3. CAD Stable Consider restarting beta blockers as an outpatient as able    Thompson Grayer, MD 07/06/2012  3:53 PM

## 2012-07-06 NOTE — Progress Notes (Addendum)
Pt was up performing hygiene with assistance and stated that she felt dizzy.  Pt was able to rest her head and reported that she felt better.  Pt ambulated with assistance back to bed and reports "I feel fine now".   Vital signs were taken :  HR 44, Oxygen Sat 99, RR 17, BP  124/53.  [place]  notified.  Nurse will continue to monitor.

## 2012-07-06 NOTE — Op Note (Signed)
SURGEON:  Thompson Grayer, MD     PREPROCEDURE DIAGNOSIS:  Symptomatic Bradycardia    POSTPROCEDURE DIAGNOSIS:  Symptomatic Bradycardia     PROCEDURES:   1.   Pacemaker implantation.     INTRODUCTION: Janet Steele is a 78 y.o. female  with a history of bradycardia who presents today for pacemaker implantation.  The patient reports intermittent episodes of dizziness and fatigue over the past few weeks.  She is on no chronotropic agents.  Her Endocrinologist suggests that her thyroid function is chronically depressed and medical therapy is limited by adherence.  She recommends that we proceed with pacemaker implantation for symptomatic bradycardia.   The patient therefore presents today for pacemaker implantation.     DESCRIPTION OF PROCEDURE:  Informed written consent was obtained, and   the patient was brought to the electrophysiology lab in a fasting state.  The patient received IV Versed as sedation for the procedure today.  The patients left chest was prepped and draped in the usual sterile fashion by the EP lab staff. The skin overlying the left deltopectoral region was infiltrated with lidocaine for local analgesia.  A 4-cm incision was made over the left deltopectoral region.  A left subcutaneous pacemaker pocket was fashioned using a combination of sharp and blunt dissection. Electrocautery was required to assure hemostasis.    RA/RV Lead Placement: The left axillary vein was therefore cannulated.  Through the left axillary vein, a Medtronic model [id number] (serial number [id number]) right atrial lead and a Medtronic model [id number] (serial number [id number]) right ventricular lead were advanced with fluoroscopic visualization into the right atrial appendage and right ventricular apex positions respectively.  Initial atrial lead P- waves measured 1.6 mV with impedance of 697 ohms and a threshold of 1.3 V at 0.5 msec.  Right ventricular lead R-waves measured 14 mV with an impedance of 755 ohms and  a threshold of 0.3 V at 0.5 msec.  Both leads   were secured to the pectoralis fascia using #2-0 silk over the suture sleeves.   Device Placement:  The leads were then connected to a Medtronic Adapta L model ADDRL 1 (serial number [id number]) pacemaker.  The pocket was irrigated with copious gentamicin solution.  The pacemaker was then placed into the pocket.  The pocket was then closed in 2 layers with 2.0 Vicryl suture for the subcutaneous and subcuticular layers.  Steri- Strips and a sterile dressing were then applied.  There were no early apparent complications.     CONCLUSIONS:   1. Successful implantation of a Medtronic Adapta L dual-chamber pacemaker for symptomatic bradycardia  2. No early apparent complications.           Thompson Grayer, MD 07/06/2012 5:41 PM

## 2012-07-06 NOTE — Telephone Encounter (Signed)
This encounter was created in error - please disregard.

## 2012-07-06 NOTE — Progress Notes (Signed)
Pt noted with HR as low as 28 and low 30's, she denies any symptoms. Also noted head twitching. Baltazar Najjar, NP notified and aware.

## 2012-07-06 NOTE — Progress Notes (Addendum)
[facility] PROGRESS NOTE  Janet Steele [id number] DOB: [birth year] DOA: 07/05/2012 PCP: Unice Cobble, MD  Assessment/Plan: Active Problems:  Weakness generalized Suspected to be related to bradycardia. Has undergone placement of dual chamber pacemaker with noted improvement in BP.    Bradycardia Possibly related to hypothyroidism. Will need to discuss compliance issues and ask family members or HHRN to f/u on med adherence.    Hypothyroidism Appreciate cardiology assistance will cont current dosing as recommended by Dr Chalmers Cater. Her TSH was normal when last checked by Dr Chalmers Cater on current dose of Synthroid.    HTN (hypertension) BP increasing after pacer placement - can likely start B blocker is no complications with pacer function occur.    CAD (coronary artery disease) As above. Cont ASA- reason for full dose?    Hyperlipidemia Related to hypothyroidism- will start statin with her h/o CAD and PVD   GERD (gastroesophageal reflux disease) Cont PPI   Diabetes mellitus Resume Amaryl in AM  Depression Resume Cymbalta and Trazodone.  Code Status: full code Disposition Plan: follow in SDU   Brief narrative: This is an 78 year old female, with known history of CAD, s/p aortocoronary bypass surgery in 1982 with SVG to diagonal and LAD, SVG to OM, SVG to PDA, s/p aortocoronary bypass surgery in 2002 with LIMA to LAD, EF of 55% by Myoview in 2006, s/p MI in 1977 and 1982, type 2 diabetes mellitus with neuropathy, hyperlipidemia, myoclonic spasms, GERD/esophageal stricture, chronic constipation, hypothyroidism, degenerative joint disease. Patient complains of progressive weakness over the past on month, worse in the last 2 weeks. Lately, she has also been plagued by dizziness. She was seen by her PMD on 06/23/12, when TSH was found to be 24.32, up from a value of 11.93 on 11/12/11. According to patient, her Synthroid was increased from 44 mcg daily, by Dr Chalmers Cater, to 88 mcg daily,  about a month and a half ago. She insists that she has been compliant.  On 7/31 she felt so bad, that she called PMD, was seen in office, and HR found to be in 54s. She was referred to the ED.     Consultants:  [facility]ler cardiology  Procedures:  Dual chamber pacemaker placement - 8/1  Antibiotics:  Vancomycin x 1 on 7/1  HPI/Subjective: Pt admits to feeling better since being in the hospital. She does not feels as lightheaded as she did at home. She has  jerking of her head/neck which she states she has had on and off for years.  Objective: Filed Vitals:   07/06/12 1815 07/06/12 1830 07/06/12 1900 07/06/12 1944  BP: 152/71 124/63 149/66 160/62  Pulse: 62 83  66  Temp:    97.5 F (36.4 C)  TempSrc:    Axillary  Resp: 17 19 19 18   Height:      Weight:      SpO2: 100% 73%  98%    Intake/Output Summary (Last 24 hours) at 07/06/12 2052 Last data filed at 07/06/12 1946  Gross per 24 hour  Intake    609 ml  Output      0 ml  Net    609 ml    Exam:   General:  Frail elderly female, alert and conversant, no distress  Cardiovascular: RRR, bradycardic  Respiratory: CTA b/l  Abdomen: soft, ntm nd, bs+  Ext: no c/c/e  Data Reviewed: Basic Metabolic Panel:  Lab [id number] 0305 07/05/12 1934 07/05/12 1624  NA 138 -- 136  K 3.4* -- 3.9  CL 102 -- 100  CO2 24 -- 22  GLUCOSE 113* -- 169*  BUN 19 -- 20  CREATININE 0.82 0.84 0.98  CALCIUM 9.2 -- 9.2  MG -- 2.1 --  PHOS -- -- --   Liver Function Tests:  Lab 07/06/12 0305 07/05/12 1624  AST 16 17  ALT 11 12  ALKPHOS 81 83  BILITOT 0.8 1.0  PROT 6.2 6.4  ALBUMIN 3.5 3.7   No results found for this basename: LIPASE:5,AMYLASE:5 in the last 168 hours No results found for this basename: AMMONIA:5 in the last 168 hours CBC:  Lab 07/06/12 0305 07/05/12 1934 07/05/12 1624  WBC 8.1 8.1 7.2  NEUTROABS -- -- --  HGB 13.2 13.5 12.3  HCT 37.6 39.6 36.7  MCV 85.5 86.1 86.4  PLT 237 252 231   Cardiac Enzymes:  Lab  07/06/12 1050 07/06/12 0305 07/05/12 1933 07/05/12 1624  CKTOTAL 101 109 120 --  CKMB 4.0 3.9 3.9 --  CKMBINDEX -- -- -- --  TROPONINI <0.30 <0.30 <0.30 <0.30   BNP (last 3 results) No results found for this basename: PROBNP:3 in the last 8760 hours CBG:  Lab 07/06/12 1228 07/06/12 1158 07/06/12 0808 07/05/12 2122  GLUCAP 145* 142* 138* 131*    Recent Results (from the past 240 hour(s))  MRSA PCR SCREENING     Status: Normal   Collection Time   07/05/12  8:16 PM      Component Value Range Status Comment   MRSA by PCR NEGATIVE  NEGATIVE Final      Studies: No results found.  Scheduled Meds:   . aspirin  325 mg Oral Daily  . fentaNYL      . guanFACINE  1 mg Oral QHS  . insulin aspart  0-9 Units Subcutaneous TID WC  . levothyroxine  100 mcg Oral QAC breakfast  . lidocaine      . midazolam      . pantoprazole  40 mg Oral Q0600  . vancomycin  1,000 mg Intravenous Q12H  . DISCONTD: chlorhexidine  60 mL Topical Once  . DISCONTD: gentamicin irrigation  80 mg Irrigation On Call  . DISCONTD: guanFACINE  1.5 mg Oral QHS  . DISCONTD: heparin  5,000 Units Subcutaneous Q8H  . DISCONTD: sodium chloride  3 mL Intravenous Q12H  . DISCONTD: sodium chloride  3 mL Intravenous Q12H  . DISCONTD: vancomycin  1,000 mg Intravenous On Call   Continuous Infusions:   . DISCONTD: sodium chloride 10 mL/hr (07/06/12 1545)  . DISCONTD: sodium chloride 50 mL/hr (07/06/12 1549)    ________________________________________________________________________  Time spent: 30 min    [facility] Pager [phone] If 8PM-8AM, please contact night-coverage at [email or web address], password [facility] 07/06/2012, 8:52 PM  LOS: 1 day

## 2012-07-07 ENCOUNTER — Inpatient Hospital Stay (HOSPITAL_COMMUNITY): Payer: Medicare Other

## 2012-07-07 ENCOUNTER — Encounter: Payer: Self-pay | Admitting: *Deleted

## 2012-07-07 DIAGNOSIS — J90 Pleural effusion, not elsewhere classified: Secondary | ICD-10-CM | POA: Diagnosis not present

## 2012-07-07 DIAGNOSIS — I498 Other specified cardiac arrhythmias: Secondary | ICD-10-CM | POA: Diagnosis not present

## 2012-07-07 DIAGNOSIS — Z950 Presence of cardiac pacemaker: Secondary | ICD-10-CM | POA: Insufficient documentation

## 2012-07-07 DIAGNOSIS — E039 Hypothyroidism, unspecified: Secondary | ICD-10-CM | POA: Diagnosis not present

## 2012-07-07 DIAGNOSIS — E782 Mixed hyperlipidemia: Secondary | ICD-10-CM | POA: Diagnosis not present

## 2012-07-07 DIAGNOSIS — R5383 Other fatigue: Secondary | ICD-10-CM | POA: Diagnosis not present

## 2012-07-07 LAB — BASIC METABOLIC PANEL
CO2: 23 mEq/L (ref 19–32)
Chloride: 99 mEq/L (ref 96–112)
Sodium: 133 mEq/L — ABNORMAL LOW (ref 135–145)

## 2012-07-07 LAB — GLUCOSE, CAPILLARY
Glucose-Capillary: 185 mg/dL — ABNORMAL HIGH (ref 70–99)
Glucose-Capillary: 164 mg/dL — ABNORMAL HIGH (ref 70–99)

## 2012-07-07 MED ORDER — YOU HAVE A PACEMAKER BOOK
Freq: Once | Status: AC
  Administered 2012-07-07: 14:00:00
  Filled 2012-07-07: qty 1

## 2012-07-07 MED ORDER — YOU HAVE A PACEMAKER BOOK
Freq: Once | Status: AC
  Administered 2012-07-07: 10:00:00
  Filled 2012-07-07: qty 1

## 2012-07-07 MED ORDER — ACETAMINOPHEN 325 MG PO TABS: 325.0000 mg | ORAL_TABLET | ORAL | Status: AC | PRN

## 2012-07-07 MED ORDER — ROSUVASTATIN CALCIUM 5 MG PO TABS: 5.0000 mg | ORAL_TABLET | Freq: Every day | ORAL | Status: DC

## 2012-07-07 NOTE — Progress Notes (Signed)
   ELECTROPHYSIOLOGY ROUNDING NOTE    Patient Name: Janet Steele Date of Encounter: 07-07-2012    SUBJECTIVE:Patient without chest pain or shortness of breath.  Moderate incisional soreness.  S/p PPM implant 07-06-2012 for symptomatic bradycardia.  TELEMETRY: Reviewed telemetry pt in atrial pacing with intrinsic ventricular conduction Filed Vitals:   07/06/12 1944 07/06/12 2320 07/07/12 0152 07/07/12 0643  BP: 160/62 147/54  140/62  Pulse: 66 71  61  Temp: 97.5 F (36.4 C) 97.9 F (36.6 C)  97.9 F (36.6 C)  TempSrc: Axillary Oral  Oral  Resp: 18 30  19   Height:      Weight:   132 lb 0.9 oz (59.9 kg)   SpO2: 98% 98%  96%    Intake/Output Summary (Last 24 hours) at 07/07/12 [id number] Last data filed at 07/06/12 1946  Gross per 24 hour  Intake    426 ml  Output      0 ml  Net    426 ml    LABS: Basic Metabolic Panel:  Basename 07/07/12 0510 07/06/12 0305 07/05/12 1934  NA 133* 138 --  K 4.3 3.4* --  CL 99 102 --  CO2 23 24 --  GLUCOSE 158* 113* --  BUN 11 19 --  CREATININE 0.81 0.82 --  CALCIUM 9.1 9.2 --  MG -- -- 2.1  PHOS -- -- --   Liver Function Tests:  [facility] 07/06/12 0305 07/05/12 1624  AST 16 17  ALT 11 12  ALKPHOS 81 83  BILITOT 0.8 1.0  PROT 6.2 6.4  ALBUMIN 3.5 3.7   CBC:  Basename 07/06/12 0305 07/05/12 1934  WBC 8.1 8.1  NEUTROABS -- --  HGB 13.2 13.5  HCT 37.6 39.6  MCV 85.5 86.1  PLT 237 252   Cardiac Enzymes:  Basename 07/06/12 1050 07/06/12 0305 07/05/12 1933  CKTOTAL 101 109 120  CKMB 4.0 3.9 3.9  CKMBINDEX -- -- --  TROPONINI <0.30 <0.30 <0.30   Thyroid Function Tests:  Basename 07/05/12 1624  TSH 25.018*  T4TOTAL 6.0  T3FREE --  THYROIDAB --   Radiology/Studies:  Final result pending, leads in stable position.  PHYSICAL EXAM Left chest with oozing on steri-strips.  New pressure dressing applied.  Patient instructed to leave in place for 2 days.   DEVICE INTERROGATION: Device interrogated by industry.  Lead values  including impedence, sensing, threshold within normal values.    Wound care, arm mobility, restrictions reviewed with patient.    Routine follow up scheduled.  Assessment/Plan per Dr Lovena Le.   A/P  S/p PPM. Stable. [place] for discharge.  Cambell Stanek, M.D.

## 2012-07-07 NOTE — Discharge Summary (Addendum)
Physician Discharge Summary  Janet Steele [id number] DOB: [birth year] DOA: 07/05/2012  PCP: Unice Cobble, MD  Admit date: 07/05/2012 Discharge date: 07/07/2012  Recommendations for Outpatient Follow-up:  1. Pt started on low dose Rosuvastatin  for dyslipidemia- please f/u for side effects.  2. Husband noted patient has insomnia- I have not adjusted any medications and have recommended they f/u with her PCP for this.  Discharge Diagnoses:  Active Problems:  Weakness generalized  Hypothyroidism  Bradycardia  HTN (hypertension)  CAD (coronary artery disease)  Hyperlipidemia  GERD (gastroesophageal reflux disease)  Diabetes mellitus   Discharge Condition: stable Diet recommendation: low sodium, low fat and low carb  Wt Readings from Last 3 Encounters:  07/07/12 59.9 kg (132 lb 0.9 oz)  07/07/12 59.9 kg (132 lb 0.9 oz)  07/05/12 58.514 kg (129 lb)    History of present illness:  This is an 78 year old female, with known history of CAD, s/p aortocoronary bypass surgery in 1982 with SVG to diagonal and LAD, SVG to OM, SVG to PDA, s/p aortocoronary bypass surgery in 2002 with LIMA to LAD, EF of 55% by Myoview in 2006, s/p MI in 1977 and 1982, type 2 diabetes mellitus with neuropathy, hyperlipidemia, myoclonic spasms, GERD/esophageal stricture, chronic constipation, hypothyroidism, degenerative joint disease. Patient complains of progressive weakness over the past on month, worse in the last 2 weeks. Lately, she has also been plagued by dizziness. She was seen by her PMD on 06/23/12, when TSH was found to be 24.32, up from a value of 11.93 on 11/12/11. According to patient, her Synthroid was increased from 44 mcg daily, by Dr Chalmers Cater, to 88 mcg daily, about a month and a half ago. She insists that she has been compliant.  On 7/31 she felt so bad that she called PMD- was seen in office-HR found to be in 59s. She was referred to the ED.    Hospital Course:  Weakness generalized  Suspected  to be related to bradycardia. Has undergone placement of dual chamber pacemaker with noted improvement in BP. However, she feels no difference yet in her "tiredness".   Bradycardia  Possibly related to hypothyroidism.   Hypothyroidism  Appreciate cardiology assistance will cont current dosing as recommended by Dr Chalmers Cater. Her TSH was normal when last checked by Dr Chalmers Cater on current dose of Synthroid.   Non-Adherance with medications? Have discussed compliance issues with her- she states she has a weekly pill organizer at home. I have spoken with her husband who notes that she does have mild memory problems. He states that he will monitor her medication intake more closely from here on.   HTN (hypertension)  BP increasing after pacer placement - can likely start B blocker as outpatient if  no complications with pacer function occur.   CAD (coronary artery disease)  As above. Cont ASA which she has been on for "years".  Hyperlipidemia  Related to hypothyroidism? - will start statin with her h/o CAD and PVD   GERD (gastroesophageal reflux disease)  Cont PPI   Diabetes mellitus  Continue home meds  Depression  Resume Cymbalta and Trazodone.    Procedures: Dual chamber pacemaker placement - 8/1   Consultations: [place] cardiology  Discharge Exam: Filed Vitals:   07/07/12 1228  BP: 144/60  Pulse: 70  Temp: 97.4 F (36.3 C)  Resp: 17   Filed Vitals:   07/07/12 0152 07/07/12 0643 07/07/12 0752 07/07/12 1228  BP:  140/62 133/70 144/60  Pulse:  61 60 70  Temp:  97.9 F (36.6 C) 97.7 F (36.5 C) 97.4 F (36.3 C)  TempSrc:  Oral Oral Oral  Resp:  19 20 17   Height:      Weight: 59.9 kg (132 lb 0.9 oz)     SpO2:  96% 96% 96%    General: Frail elderly female, alert and conversant, no distress  Cardiovascular: RRR, bradycardic  Respiratory: CTA b/l  Abdomen: soft, ntm nd, bs+  Ext: no c/c/e  Discharge Instructions  Discharge Orders    Future Appointments: Provider:  Department: Dept Phone: Center:   07/17/2012 1:15 PM Andrez Grime, [place] [facility]   10/18/2012 2:15 PM Thompson Grayer, MD [place] [phone] [facility]     Future Orders Please Complete By Expires   Diet - low sodium heart healthy      Comments:   Also low fat and low carb diet   Increase activity slowly        Medication List  As of 07/07/2012 12:59 PM   TAKE these medications         acetaminophen 325 MG tablet   Commonly known as: TYLENOL   Take 1-2 tablets (325-650 mg total) by mouth every 4 (four) hours as needed for pain or fever.      ALREX 0.2 % Susp   Generic drug: loteprednol   Place 1 drop into both eyes daily as needed. For itchy eyes      aspirin 325 MG EC tablet   Take 325 mg by mouth daily.      Bepotastine Besilate 1.5 % Soln   Place 1 drop into both eyes daily as needed. For itchy eyes      DULoxetine 60 MG capsule   Commonly known as: CYMBALTA   Take 60 mg by mouth daily as needed. For foot pain      glimepiride 2 MG tablet   Commonly known as: AMARYL   Take 2 mg by mouth daily before breakfast.      guanFACINE 1 MG tablet   Commonly known as: TENEX   Take 1.5 mg by mouth at bedtime.      levothyroxine 88 MCG tablet   Commonly known as: SYNTHROID, LEVOTHROID   Take 88 mcg by mouth daily.      metFORMIN 500 MG tablet   Commonly known as: GLUCOPHAGE   Take 500 mg by mouth daily with breakfast.      nitroGLYCERIN 0.4 MG SL tablet   Commonly known as: NITROSTAT   Place 0.4 mg under the tongue every 5 (five) minutes as needed. For chest pain      omeprazole 20 MG tablet   Commonly known as: PRILOSEC OTC   Take 20 mg by mouth daily as needed. For reflux      rosuvastatin 5 MG tablet   Commonly known as: CRESTOR   Take 1 tablet (5 mg total) by mouth daily.      traZODone 50 MG tablet   Commonly known as: DESYREL   Take 150 mg by mouth at bedtime as needed.           Follow-up Information      Follow up with EDMISTEN, BROOKE, PA-C on 07/17/2012. (At 1:15 PM for wound check)    Contact information:   [place] [phone]       Follow up with Thompson Grayer, MD on 10/18/2012. (At 2:15 PM )  Contact information:   Press [employer] [address] [place] [phone]           The results of significant diagnostics from this hospitalization (including imaging, microbiology, ancillary and laboratory) are listed below for reference.    Significant Diagnostic Studies: Dg Chest 2 View  07/07/2012  *RADIOLOGY REPORT*  Clinical Data: Post pacemaker insertion.  Soreness.  CHEST - 2 VIEW  Comparison: 08/04/2010  Findings: Lateral view degraded by patient arm position.  Pacer with leads at right atrium and right ventricle.  No lead discontinuity.  Midline trachea.  Borderline cardiomegaly. Atherosclerosis in the transverse aorta.  Trace right pleural fluid, thickening blunts the costophrenic angle on the frontal. No pneumothorax.  Right apical pleural thickening. Clear lungs.  IMPRESSION:  1.  Appropriate position of dual lead pacer, without pneumothorax. 2. No acute cardiopulmonary disease. 3.  Trace right pleural fluid or thickening.  Original Report Authenticated By: Areta Haber, M.D.    Microbiology: Recent Results (from the past 240 hour(s))  MRSA PCR SCREENING     Status: Normal   Collection Time   07/05/12  8:16 PM      Component Value Range Status Comment   MRSA by PCR NEGATIVE  NEGATIVE Final      Labs: Basic Metabolic Panel:  Lab [id number] 0510 07/06/12 0305 07/05/12 1934 07/05/12 1624  NA 133* 138 -- 136  K 4.3 3.4* -- 3.9  CL 99 102 -- 100  CO2 23 24 -- 22  GLUCOSE 158* 113* -- 169*  BUN 11 19 -- 20  CREATININE 0.81 0.82 0.84 0.98  CALCIUM 9.1 9.2 -- 9.2  MG -- -- 2.1 --  PHOS -- -- -- --   Liver Function Tests:  Lab 07/06/12 0305 07/05/12 1624   AST 16 17  ALT 11 12  ALKPHOS 81 83  BILITOT 0.8 1.0  PROT 6.2 6.4  ALBUMIN 3.5 3.7   No results found for this basename: LIPASE:5,AMYLASE:5 in the last 168 hours No results found for this basename: AMMONIA:5 in the last 168 hours CBC:  Lab 07/06/12 0305 07/05/12 1934 07/05/12 1624  WBC 8.1 8.1 7.2  NEUTROABS -- -- --  HGB 13.2 13.5 12.3  HCT 37.6 39.6 36.7  MCV 85.5 86.1 86.4  PLT 237 252 231   Cardiac Enzymes:  Lab 07/06/12 1050 07/06/12 0305 07/05/12 1933 07/05/12 1624  CKTOTAL 101 109 120 --  CKMB 4.0 3.9 3.9 --  CKMBINDEX -- -- -- --  TROPONINI <0.30 <0.30 <0.30 <0.30   BNP: BNP (last 3 results) No results found for this basename: PROBNP:3 in the last 8760 hours CBG:  Lab 07/07/12 0749 07/06/12 2148 07/06/12 1228 07/06/12 1158 07/06/12 0808  GLUCAP 185* 148* 145* 142* 138*    Time coordinating discharge: >45 minutes  Signed:  [place]  [facility] 07/07/2012, 12:59 PM

## 2012-07-07 NOTE — Telephone Encounter (Signed)
These were discussed at her office visit and copy provided

## 2012-07-07 NOTE — ED Provider Notes (Signed)
I saw and evaluated the patient, reviewed the resident's note and I agree with the findings and plan.  Barbara Cower, MD 07/07/12 [phone]

## 2012-07-10 ENCOUNTER — Telehealth: Payer: Self-pay | Admitting: Cardiovascular Disease

## 2012-07-10 NOTE — Telephone Encounter (Signed)
Fu msg  Pt called back and said she wanted [facility] on [address]

## 2012-07-10 NOTE — Telephone Encounter (Signed)
I spoke with the pt and made her aware that per discharge summary she should be taking levothyroxine 86mcg daily.  I made her aware that Dr Chalmers Cater did not want to increase this medication. I also asked the pt to contact her PCP about insomnia and medical treatment.  At this time the pt will touch base with Dr Almetta Lovely office to see when she will need follow-up labs.

## 2012-07-10 NOTE — Telephone Encounter (Signed)
Pt needs ambien to help her sleep she is only sleeping 2hrs a night she only takes a half a pill and her shoulder is really sore. Pt was to get her synthroid increased and she still has not heard from anyone regarding this and it was not called in yet. Please call into [facility] on [address].

## 2012-07-17 ENCOUNTER — Ambulatory Visit (INDEPENDENT_AMBULATORY_CARE_PROVIDER_SITE_OTHER): Payer: Medicare Other | Admitting: Cardiology

## 2012-07-17 ENCOUNTER — Encounter: Payer: Self-pay | Admitting: Internal Medicine

## 2012-07-17 ENCOUNTER — Encounter: Payer: Self-pay | Admitting: Cardiology

## 2012-07-17 VITALS — BP 122/66 | HR 80 | Ht 61.0 in | Wt 132.0 lb

## 2012-07-17 DIAGNOSIS — Z950 Presence of cardiac pacemaker: Secondary | ICD-10-CM | POA: Diagnosis not present

## 2012-07-17 DIAGNOSIS — I495 Sick sinus syndrome: Secondary | ICD-10-CM | POA: Diagnosis not present

## 2012-07-17 LAB — PACEMAKER DEVICE OBSERVATION
AL IMPEDENCE PM: 566 Ohm
AL THRESHOLD: 0.5 V
BATTERY VOLTAGE: 2.79 V
RV LEAD AMPLITUDE: 16 mv
RV LEAD IMPEDENCE PM: 646 Ohm

## 2012-07-17 NOTE — Progress Notes (Signed)
Device check only. See PaceArt report.

## 2012-07-24 ENCOUNTER — Other Ambulatory Visit: Payer: Self-pay | Admitting: *Deleted

## 2012-07-24 MED ORDER — FREESTYLE LANCETS MISC: Status: AC

## 2012-07-24 MED ORDER — GLUCOSE BLOOD VI STRP: ORAL_STRIP | Status: AC

## 2012-07-24 NOTE — Telephone Encounter (Signed)
Pt aware Rx sent.  

## 2012-07-27 DIAGNOSIS — H04129 Dry eye syndrome of unspecified lacrimal gland: Secondary | ICD-10-CM | POA: Diagnosis not present

## 2012-07-27 DIAGNOSIS — H2640 Unspecified secondary cataract: Secondary | ICD-10-CM | POA: Diagnosis not present

## 2012-07-27 DIAGNOSIS — Z961 Presence of intraocular lens: Secondary | ICD-10-CM | POA: Diagnosis not present

## 2012-07-27 DIAGNOSIS — E119 Type 2 diabetes mellitus without complications: Secondary | ICD-10-CM | POA: Diagnosis not present

## 2012-08-16 ENCOUNTER — Telehealth: Payer: Self-pay | Admitting: General Practice

## 2012-08-16 ENCOUNTER — Other Ambulatory Visit: Payer: Self-pay | Admitting: Internal Medicine

## 2012-08-16 ENCOUNTER — Other Ambulatory Visit: Payer: Self-pay | Admitting: General Practice

## 2012-08-16 MED ORDER — ROSUVASTATIN CALCIUM 5 MG PO TABS: 5.0000 mg | ORAL_TABLET | Freq: Every day | ORAL | Status: DC

## 2012-08-16 NOTE — Telephone Encounter (Signed)
Called in crestor 5 mg tablets.

## 2012-08-17 ENCOUNTER — Ambulatory Visit (INDEPENDENT_AMBULATORY_CARE_PROVIDER_SITE_OTHER): Payer: Medicare Other | Admitting: *Deleted

## 2012-08-17 DIAGNOSIS — I442 Atrioventricular block, complete: Secondary | ICD-10-CM

## 2012-08-17 MED ORDER — CLINDAMYCIN HCL 300 MG PO CAPS: 300.0000 mg | ORAL_CAPSULE | Freq: Three times a day (TID) | ORAL | Status: DC

## 2012-08-17 NOTE — Progress Notes (Signed)
Small stitch abscess noted.  Packed with iodoform and covered.  Patient to start clindamycin 300mg  one po tid for 5 days per Dr. Caryl Comes.  ROV 08/21/12.

## 2012-08-21 ENCOUNTER — Ambulatory Visit (INDEPENDENT_AMBULATORY_CARE_PROVIDER_SITE_OTHER): Payer: Medicare Other | Admitting: *Deleted

## 2012-08-21 DIAGNOSIS — I442 Atrioventricular block, complete: Secondary | ICD-10-CM

## 2012-08-21 DIAGNOSIS — R0989 Other specified symptoms and signs involving the circulatory and respiratory systems: Secondary | ICD-10-CM

## 2012-08-21 MED ORDER — CLINDAMYCIN HCL 300 MG PO CAPS: 300.0000 mg | ORAL_CAPSULE | Freq: Three times a day (TID) | ORAL | Status: AC

## 2012-08-21 NOTE — Progress Notes (Signed)
Wound re-check today for stitch abscess.  Site much improved.   Clindamycin renewed for 5 days per Dr. Rayann Heman.  ROV 08/28/12.

## 2012-08-22 ENCOUNTER — Other Ambulatory Visit: Payer: Self-pay | Admitting: Neurological Surgery

## 2012-08-22 DIAGNOSIS — M545 Low back pain: Secondary | ICD-10-CM

## 2012-08-28 ENCOUNTER — Ambulatory Visit (INDEPENDENT_AMBULATORY_CARE_PROVIDER_SITE_OTHER): Payer: Medicare Other | Admitting: *Deleted

## 2012-08-28 DIAGNOSIS — I442 Atrioventricular block, complete: Secondary | ICD-10-CM

## 2012-08-28 LAB — PACEMAKER DEVICE OBSERVATION

## 2012-08-28 NOTE — Progress Notes (Signed)
Wound check only

## 2012-09-12 DIAGNOSIS — E7800 Pure hypercholesterolemia, unspecified: Secondary | ICD-10-CM | POA: Diagnosis not present

## 2012-09-12 DIAGNOSIS — E039 Hypothyroidism, unspecified: Secondary | ICD-10-CM | POA: Diagnosis not present

## 2012-09-12 DIAGNOSIS — Z23 Encounter for immunization: Secondary | ICD-10-CM | POA: Diagnosis not present

## 2012-09-12 DIAGNOSIS — G609 Hereditary and idiopathic neuropathy, unspecified: Secondary | ICD-10-CM | POA: Diagnosis not present

## 2012-09-18 ENCOUNTER — Other Ambulatory Visit: Payer: Self-pay | Admitting: Dermatology

## 2012-09-18 DIAGNOSIS — Z85828 Personal history of other malignant neoplasm of skin: Secondary | ICD-10-CM | POA: Diagnosis not present

## 2012-09-18 DIAGNOSIS — D485 Neoplasm of uncertain behavior of skin: Secondary | ICD-10-CM | POA: Diagnosis not present

## 2012-09-18 DIAGNOSIS — L738 Other specified follicular disorders: Secondary | ICD-10-CM | POA: Diagnosis not present

## 2012-09-18 DIAGNOSIS — C44319 Basal cell carcinoma of skin of other parts of face: Secondary | ICD-10-CM | POA: Diagnosis not present

## 2012-09-18 DIAGNOSIS — L821 Other seborrheic keratosis: Secondary | ICD-10-CM | POA: Diagnosis not present

## 2012-09-27 ENCOUNTER — Ambulatory Visit
Admission: RE | Admit: 2012-09-27 | Discharge: 2012-09-27 | Disposition: A | Discharge status: Home / Self Care | Payer: Medicare Other | Source: Ambulatory Visit | Attending: Neurological Surgery | Admitting: Neurological Surgery

## 2012-09-27 DIAGNOSIS — M47817 Spondylosis without myelopathy or radiculopathy, lumbosacral region: Secondary | ICD-10-CM | POA: Diagnosis not present

## 2012-09-27 DIAGNOSIS — M545 Low back pain: Secondary | ICD-10-CM

## 2012-09-27 MED ORDER — IOHEXOL 180 MG/ML  SOLN
1.0000 mL | Freq: Once | INTRAMUSCULAR | Status: AC | PRN
  Administered 2012-09-27: 1 mL via EPIDURAL

## 2012-09-27 MED ORDER — METHYLPREDNISOLONE ACETATE 40 MG/ML INJ SUSP (RADIOLOG
120.0000 mg | Freq: Once | INTRAMUSCULAR | Status: AC
  Administered 2012-09-27: 120 mg via EPIDURAL

## 2012-10-10 DIAGNOSIS — C44111 Basal cell carcinoma of skin of unspecified eyelid, including canthus: Secondary | ICD-10-CM | POA: Diagnosis not present

## 2012-10-16 ENCOUNTER — Ambulatory Visit (INDEPENDENT_AMBULATORY_CARE_PROVIDER_SITE_OTHER): Payer: Medicare Other | Admitting: Internal Medicine

## 2012-10-16 ENCOUNTER — Encounter: Payer: Self-pay | Admitting: Internal Medicine

## 2012-10-16 VITALS — BP 138/78 | HR 87 | Ht 61.0 in | Wt 127.4 lb

## 2012-10-16 DIAGNOSIS — I25810 Atherosclerosis of coronary artery bypass graft(s) without angina pectoris: Secondary | ICD-10-CM

## 2012-10-16 DIAGNOSIS — R001 Bradycardia, unspecified: Secondary | ICD-10-CM

## 2012-10-16 DIAGNOSIS — I498 Other specified cardiac arrhythmias: Secondary | ICD-10-CM

## 2012-10-16 LAB — PACEMAKER DEVICE OBSERVATION
AL AMPLITUDE: 5.6 mv
ATRIAL PACING PM: 95
BAMS-0001: 150 {beats}/min
RV LEAD THRESHOLD: 0.5 V
VENTRICULAR PACING PM: 0

## 2012-10-16 NOTE — Patient Instructions (Addendum)
Your physician wants you to follow-up in: 9 months with Dr Allred You will receive a reminder letter in the mail two months in advance. If you don't receive a letter, please call our office to schedule the follow-up appointment.  

## 2012-10-16 NOTE — Progress Notes (Signed)
PCP: Unice Cobble, MD Primary Cardiologist:  Dr Sibyl Parr NERY Janet Steele is a 78 y.o. female who presents today for routine electrophysiology followup.  Since her pacemaker implantation 81/13, the patient reports doing very well.   Her energy is much improved. Today, she denies symptoms of palpitations, chest pain, shortness of breath,  lower extremity edema, dizziness, presyncope, or syncope.  The patient is otherwise without complaint today.   Past Medical History  Diagnosis Date  . Hypothyroidism   . Hyperlipidemia   . GERD (gastroesophageal reflux disease)   . Anxiety   . Diabetes mellitus   . Hypertension   . PVD (peripheral vascular disease)   . Carotid bruit   . Other dysphagia   . Other esophagitis   . Hemorrhoid   . Other constipation   . Colon, diverticulosis   . Hiatal hernia   . Osteoarthrosis, unspecified whether generalized or localized, unspecified site   . Degenerative joint disease   . Esophageal stricture   . Unspecified hearing loss   . Diastolic dysfunction   . CAD (coronary artery disease)    Past Surgical History  Procedure Date  . Appendectomy   . Cholecystectomy   . Abdominal hysterectomy   . Oophorectomy     bilateral  . Coronary artery bypass graft     x4(1982),02-2002 CABG X2  . Back surgery 01-2000  . Pacemaker insertion 07/06/12    MDT Adapta L implanted by Dr Rayann Heman for symptomatic bradycardia    Current Outpatient Prescriptions  Medication Sig Dispense Refill  . acetaminophen (TYLENOL) 325 MG tablet Take 1-2 tablets (325-650 mg total) by mouth every 4 (four) hours as needed for pain or fever.      Marland Kitchen aspirin 325 MG EC tablet Take 325 mg by mouth daily.        . Bepotastine Besilate 1.5 % SOLN Place 1 drop into both eyes daily as needed. For itchy eyes      . DULoxetine (CYMBALTA) 60 MG capsule Take 60 mg by mouth daily as needed. For foot pain      . glimepiride (AMARYL) 2 MG tablet Take 2 mg by mouth daily before breakfast.       . glucose  blood (FREESTYLE LITE) test strip Test once a day or as needed. Dx 250.00 Freestyle lite test strip  100 each  2  . guanFACINE (TENEX) 1 MG tablet Take 1.5 mg by mouth at bedtime.       . Lancets (FREESTYLE) lancets Test once a day or as needed. Dx 250.00 Freestyle lite lancet  100 each  2  . levothyroxine (SYNTHROID, LEVOTHROID) 88 MCG tablet Take 88 mcg by mouth daily.      Marland Kitchen loteprednol (ALREX) 0.2 % SUSP Place 1 drop into both eyes daily as needed. For itchy eyes      . metFORMIN (GLUCOPHAGE) 500 MG tablet Take 500 mg by mouth daily with breakfast.       . nitroGLYCERIN (NITROSTAT) 0.4 MG SL tablet Place 0.4 mg under the tongue every 5 (five) minutes as needed. For chest pain      . omeprazole (PRILOSEC OTC) 20 MG tablet Take 20 mg by mouth daily as needed. For reflux      . rosuvastatin (CRESTOR) 5 MG tablet Take 1 tablet (5 mg total) by mouth daily.  30 tablet  1  . traZODone (DESYREL) 50 MG tablet Take 50 mg by mouth at bedtime as needed. Take 1 1/2 as needed  Physical Exam: Filed Vitals:   10/16/12 1427  BP: 138/78  Pulse: 87  Height: 5\' 1"  (1.549 m)  Weight: 127 lb 6.4 oz (57.788 kg)    GEN- The patient is well appearing, alert and oriented x 3 today.   Head- normocephalic, atraumatic Eyes-  Sclera clear, conjunctiva pink Ears- hearing intact Oropharynx- clear Lungs- Clear to ausculation bilaterally, normal work of breathing Chest- pacemaker pocket is well healed Heart- Regular rate and rhythm, no murmurs, rubs or gallops, PMI not laterally displaced GI- soft, NT, ND, + BS Extremities- no clubbing, cyanosis, or edema  Pacemaker interrogation- reviewed in detail today,  See PACEART report  Assessment and Plan:  1. Bradycardia Dizziness is much improved s/p PPM Normal pacemaker function See Pace Art report No changes today  2.  CAD No ischemic symptoms No changes  Return to the device clinic in 9 months

## 2012-10-18 ENCOUNTER — Encounter: Payer: Medicare Other | Admitting: Internal Medicine

## 2012-10-18 DIAGNOSIS — M545 Low back pain: Secondary | ICD-10-CM | POA: Diagnosis not present

## 2012-11-03 ENCOUNTER — Other Ambulatory Visit: Payer: Self-pay | Admitting: Internal Medicine

## 2012-11-03 NOTE — Telephone Encounter (Signed)
Rx sent.    MW 

## 2012-11-07 ENCOUNTER — Ambulatory Visit (INDEPENDENT_AMBULATORY_CARE_PROVIDER_SITE_OTHER): Payer: Medicare Other | Admitting: Internal Medicine

## 2012-11-07 ENCOUNTER — Encounter: Payer: Self-pay | Admitting: Internal Medicine

## 2012-11-07 VITALS — BP 112/68 | HR 86 | Ht 61.0 in | Wt 133.8 lb

## 2012-11-07 DIAGNOSIS — K219 Gastro-esophageal reflux disease without esophagitis: Secondary | ICD-10-CM

## 2012-11-07 DIAGNOSIS — R1110 Vomiting, unspecified: Secondary | ICD-10-CM

## 2012-11-07 DIAGNOSIS — R1310 Dysphagia, unspecified: Secondary | ICD-10-CM

## 2012-11-07 DIAGNOSIS — K222 Esophageal obstruction: Secondary | ICD-10-CM | POA: Diagnosis not present

## 2012-11-07 MED ORDER — DEXLANSOPRAZOLE 60 MG PO CPDR: 60.0000 mg | DELAYED_RELEASE_CAPSULE | Freq: Every day | ORAL | Status: DC

## 2012-11-07 NOTE — Patient Instructions (Addendum)
We have given you some samples of Dexilant to try.  Let us know if this works for you  We have given you some information on reflux precautions

## 2012-11-07 NOTE — Progress Notes (Signed)
HISTORY OF PRESENT ILLNESS:  Janet Steele is a 78 y.o. female with multiple significant medical problems as listed below. She is followed in this office principally for GERD complicated by peptic stricture. As well, chronic dysphagia secondary to significant dysmotility as well as stricture. She also has moderate hiatal hernia. She was last seen in this office 12/17/2011. See that dictation for details. Reflux symptoms were felt to be exacerbated by medical noncompliance with PPI therapy. She resumes samples and has since been on Prilosec OTC 20 mg daily. This does help some reflux symptoms. Her chief complaint is that of significant regurgitation. Generally clear mucus substance. Occasionally food substance. This occurs about 4 times per week. There is burning in the substernal region with discomfort after episodes. She denies significant solid food dysphagia. Mostly has trouble with liquids , particularly water. Her last upper endoscopy was performed October 2012. The esophagus was dilated to a maximal diameter of 20 mm. Review of outside laboratories from August shows normal CBC with hemoglobin 13.2. Normal LFTs. Recent pacemaker replacement. She has gained 3 pounds since her last visit.  REVIEW OF SYSTEMS:  All non-GI ROS negative except for confusion, cough, excessive urination  Past Medical History  Diagnosis Date  . Hypothyroidism   . Hyperlipidemia   . GERD (gastroesophageal reflux disease)   . Anxiety   . Diabetes mellitus   . Hypertension   . PVD (peripheral vascular disease)   . Carotid bruit   . Other dysphagia   . Other esophagitis   . Hemorrhoid   . Other constipation   . Colon, diverticulosis   . Hiatal hernia   . Osteoarthrosis, unspecified whether generalized or localized, unspecified site   . Degenerative joint disease   . Esophageal stricture   . Unspecified hearing loss   . Diastolic dysfunction   . CAD (coronary artery disease)     Past Surgical History   Procedure Date  . Appendectomy   . Cholecystectomy   . Abdominal hysterectomy   . Oophorectomy     bilateral  . Coronary artery bypass graft     x4(1982),02-2002 CABG X2  . Back surgery 01-2000  . Pacemaker insertion 07/06/12    MDT Adapta L implanted by Dr Rayann Heman for symptomatic bradycardia    Social History Janet Steele  reports that she quit smoking about 36 years ago. Her smoking use included Cigarettes. She has never used smokeless tobacco. She reports that she drinks about 2.4 ounces of alcohol per week. She reports that she does not use illicit drugs.  family history includes Heart attack in her father and Heart disease in her sister.  There is no history of Colon cancer.  Allergies  Allergen Reactions  . Codeine     REACTION: rash  . Morphine     REACTION: hives  . Penicillins     REACTION: itching/rash  . Sulfonamide Derivatives     REACTION: itching, rash  . Hydrocodone-Acetaminophen     REACTION: unspecified  . Meperidine Hcl     REACTION: itching  . Metformin     REACTION: nervous  . Metoclopramide Hcl     REACTION: unspecified  . Metoprolol Succinate     REACTION: nervous  . Moxifloxacin     REACTION: itching  . Oxycodone-Aspirin     REACTION: rash  . Pentazocine Lactate     REACTION: ? nausea  . Pioglitazone     REACTION: bloating  . Tramadol Hcl     REACTION: unspecified  PHYSICAL EXAMINATION: Vital signs: BP 112/68  Pulse 86  Ht 5\' 1"  (1.549 m)  Wt 133 lb 12.8 oz (60.691 kg)  BMI 25.28 kg/m2  SpO2 98% General: Well-developed, well-nourished, no acute distress HEENT: Sclerae are anicteric, conjunctiva pink. Oral mucosa intact Lungs: Clear Heart: Regular Abdomen: soft, nontender, nondistended, no obvious ascites, no peritoneal signs, normal bowel sounds. No organomegaly. No succussion splash. Extremities: No edema Psychiatric: alert and oriented x3. Cooperative    ASSESSMENT:  #1. Chronic GERD with hiatal hernia. Principal  problem at this time is regurgitation with pyrosis. #2. Chronic dysphagia with esophageal dysmotility #3. History of peptic stricture requiring dilation. No significant solid food dysphagia at this time #4. Multiple medical problems   PLAN:  #1. Long discussion, as we have had previously, regarding her anatomic and physiologic abnormalities. She is not a candidate for antireflux surgery for multiple reasons. As such, she is relegated to medical therapy, strict reflux precautions, and as needed esophageal dilation. At this point, I would like to place her on Dexilant 60 mg daily. Because of cost related issues, I have given her one-month supply. If this is helpful, then she can take Prilosec OTC twice a day. As well, we have discussed reflux precautions with attention to smaller more frequent meals, elevation of the bed, and avoidance of activity after meals. Reflux precautions sheet literature provided. She will followup as needed

## 2012-11-08 ENCOUNTER — Encounter: Payer: Self-pay | Admitting: Internal Medicine

## 2012-11-08 ENCOUNTER — Ambulatory Visit (INDEPENDENT_AMBULATORY_CARE_PROVIDER_SITE_OTHER): Payer: Medicare Other | Admitting: Internal Medicine

## 2012-11-08 ENCOUNTER — Other Ambulatory Visit: Payer: Self-pay | Admitting: Internal Medicine

## 2012-11-08 VITALS — BP 126/72 | HR 83 | Temp 98.0°F | Wt 133.2 lb

## 2012-11-08 DIAGNOSIS — R259 Unspecified abnormal involuntary movements: Secondary | ICD-10-CM | POA: Diagnosis not present

## 2012-11-08 DIAGNOSIS — R350 Frequency of micturition: Secondary | ICD-10-CM

## 2012-11-08 DIAGNOSIS — E119 Type 2 diabetes mellitus without complications: Secondary | ICD-10-CM | POA: Diagnosis not present

## 2012-11-08 DIAGNOSIS — R05 Cough: Secondary | ICD-10-CM

## 2012-11-08 MED ORDER — BENZONATATE 200 MG PO CAPS: 200.0000 mg | ORAL_CAPSULE | Freq: Three times a day (TID) | ORAL | Status: DC | PRN

## 2012-11-08 NOTE — Progress Notes (Signed)
  Subjective:    Patient ID: Janet Steele, female    DOB: 03-06-1929, 78 y.o.   MRN: [id number]  HPI  She has several issues. She's had frequent urination for 2 months without associated flank pain, dysuria, pyuria, or hematuria. She denies fever, chills, or sweats  Her diabetes is monitored by Dr Chalmers Cater, endocrinologist; she does not know last A1c.  She also has a cough with thick sputum intermittently for the last 3-4 years.Tessalon perles have been of benefit previously. The cough is followed by sneezing x2. She denies associated itchy, watery eyes. Her last chest x-ray was in August following the pacemaker placement. She is not on an ACE inhibitor    Review of Systems She describes extreme nervousness despite taking trazodone from her neurologist, Dr. Erling Cruz     Objective:   Physical Exam General :adequate nourishment w/o distress. Severe recurrent, involuntary  jerking head movements  Eyes: No conjunctival inflammation or scleral icterus is present.  Oral exam: Upper denture & lower partial; lips and gums are healthy appearing.There is no oropharyngeal erythema or exudate noted.   Heart:  Normal rate and regular rhythm. S1 and S2 normal without gallop,  click, rub or other extra sounds  . Flow murmur   Lungs:Minor dry rales RLL; no wheezes, rhonchi,or rubs present.No increased work of breathing.   Abdomen: bowel sounds normal, soft and non-tender without masses, organomegaly or hernias noted.  No guarding or rebound   Skin:Warm & dry.  Intact without suspicious lesions or rashes ; no jaundice or tenting  Lymphatic: No lymphadenopathy is noted about the head, neck, axilla.   MS: no flank tenderness to percussion ; neg SLR            Assessment & Plan:  #1 urinary frequency in the context of diabetes of unknown control  #2 recurrent cough, productive  #3 anxiety  #4 involuntary head movement  Plan: See orders and recommendations

## 2012-11-08 NOTE — Patient Instructions (Addendum)
Order for x-rays entered into  the computer; these will be performed at [address] across from [facility]. No appointment is necessary.   If you activate My Chart; the results can be released to you as soon as they populate from the lab. If you choose not to use this program; the labs have to be reviewed, copied & mailed   causing a delay in getting the results to you.     

## 2012-11-09 DIAGNOSIS — R350 Frequency of micturition: Secondary | ICD-10-CM | POA: Diagnosis not present

## 2012-11-09 DIAGNOSIS — H04129 Dry eye syndrome of unspecified lacrimal gland: Secondary | ICD-10-CM | POA: Diagnosis not present

## 2012-11-09 LAB — POCT URINALYSIS DIPSTICK
Glucose, UA: NEGATIVE
Ketones, UA: NEGATIVE
Protein, UA: NEGATIVE
Spec Grav, UA: 1.01

## 2012-11-09 NOTE — Telephone Encounter (Signed)
Fasting Lipid/Hep 272.4

## 2012-11-09 NOTE — Addendum Note (Signed)
Addended by: Modena Morrow D on: 11/09/2012 09:03 AM   Modules accepted: Orders

## 2012-11-10 LAB — HEMOGLOBIN A1C: Hgb A1c MFr Bld: 7.4 % — ABNORMAL HIGH (ref 4.6–6.5)

## 2012-11-11 LAB — URINE CULTURE: Colony Count: NO GROWTH

## 2012-11-16 ENCOUNTER — Telehealth: Payer: Self-pay | Admitting: *Deleted

## 2012-11-16 NOTE — Telephone Encounter (Signed)
Pt left VM wanting to know the result of labs because she is unable to access my chart. Discuss with patient.   Entered by Hendricks Limes, MD at 11/12/2012 11:02 AM As there are NOT more than 100,000 colonies of a single bacteria; a true urinary tract infection is NOT present . Hopp   Entered by Hendricks Limes, MD at 11/10/2012 5:44 PM A1c assesses average 24 hour glucose over prior 6-12 weeks. A1c GOALS:   Good diabetic control: 6.5-7 %   Fair diabetic control: 7-8 %   Poor diabetic control: greater than 8 % ( except with additional factors such as advanced age; significant coronary or neurologic disease,etc).Share results with Dr Chalmers Cater

## 2012-11-17 DIAGNOSIS — R259 Unspecified abnormal involuntary movements: Secondary | ICD-10-CM | POA: Diagnosis not present

## 2012-11-17 DIAGNOSIS — G609 Hereditary and idiopathic neuropathy, unspecified: Secondary | ICD-10-CM | POA: Diagnosis not present

## 2012-11-22 DIAGNOSIS — H01009 Unspecified blepharitis unspecified eye, unspecified eyelid: Secondary | ICD-10-CM | POA: Diagnosis not present

## 2012-11-22 DIAGNOSIS — H00019 Hordeolum externum unspecified eye, unspecified eyelid: Secondary | ICD-10-CM | POA: Diagnosis not present

## 2012-12-18 DIAGNOSIS — B0230 Zoster ocular disease, unspecified: Secondary | ICD-10-CM | POA: Diagnosis not present

## 2012-12-18 DIAGNOSIS — B029 Zoster without complications: Secondary | ICD-10-CM | POA: Diagnosis not present

## 2012-12-25 DIAGNOSIS — B0230 Zoster ocular disease, unspecified: Secondary | ICD-10-CM | POA: Diagnosis not present

## 2013-01-08 ENCOUNTER — Encounter: Payer: Self-pay | Admitting: Nurse Practitioner

## 2013-01-08 ENCOUNTER — Telehealth: Payer: Self-pay | Admitting: Internal Medicine

## 2013-01-08 ENCOUNTER — Ambulatory Visit (INDEPENDENT_AMBULATORY_CARE_PROVIDER_SITE_OTHER): Payer: Medicare Other | Admitting: Nurse Practitioner

## 2013-01-08 VITALS — BP 112/72 | HR 66 | Ht 61.0 in | Wt 137.0 lb

## 2013-01-08 DIAGNOSIS — K219 Gastro-esophageal reflux disease without esophagitis: Secondary | ICD-10-CM

## 2013-01-08 DIAGNOSIS — K222 Esophageal obstruction: Secondary | ICD-10-CM

## 2013-01-08 NOTE — Progress Notes (Signed)
01/08/2013 Janet Steele [id number] March 26, 1929   History of Present Illness:  Patient is an 79 year old female known to Dr. Henrene Pastor. She has multiple significant medical problems, multiple allergies. Patient is followed by Dr. Henrene Pastor for history of GERD complicated by peptic stricture. She also has chronic dysphasia secondary to dysmotility. Her last EGD with dilation was October 2012 at which time a large caliber ringlike stricture was found in the distal esophagus. A hiatal hernia was seen. Balloon dilation and performed to dilate the stricture. She has been on daily PPI therapy for years but ran out of medication approximately 8 days ago. Patient states she has done well from a swallowing standpoint until about one month ago. Almost everything she eats gets stuck in her, including water. When food is lodged in the esophagus that causes discomfort . The pain with dysphasia is not a new problem.  For relief she usually leans over the sink and regurgitates esophageal contents.   Current Medications, Allergies, Past Medical History, Past Surgical History, Family History and Social History were reviewed in [employer] record.   Physical Exam: General: Well developed , white   female in no acute distress Head: Normocephalic and atraumatic Mouth: No oral lesions or exudate Eyes:  sclerae anicteric, conjunctiva pink  Ears: Normal auditory acuity Lungs: Clear throughout to auscultation Heart: Regular rate and rhythm Abdomen: Soft, non tender and non distended. No masses, no hepatomegaly. Normal bowel sounds Musculoskeletal: Symmetrical with no gross deformities  Extremities: No edema  Neurological: Alert oriented x 4, grossly nonfocal Psychological:  Alert and cooperative. Normal mood and affect  Assessment and Recommendations:  1. recurrent dysphasia, mainly to solids but liquids can be problematic as well. Patient has history of esophageal dysmotility but need to rule out  recurrent esophageal stricture. For further evaluation and treatment patient will be scheduled for EGD with dilation with Dr. Henrene Pastor.  The benefits, risks, and potential complications of EGD with possible biopsies and/or dilation were discussed with the patient and she agrees to proceed.   2. GERD, longstanding. She has been out of PPI for 8 days. Samples of PPI given.

## 2013-01-08 NOTE — Telephone Encounter (Signed)
Pt states she is having difficulty swallowing, states she vomits what she eats. Pt reports she even had trouble with a few soft pears last night. Pt scheduled to see Tye Savoy NP today at 2pm per pt request. Pt had another appt, she requested the appt time.

## 2013-01-08 NOTE — Patient Instructions (Addendum)
You have been scheduled for an endoscopy with propofol. Please follow written instructions given to you at your visit today. If you use inhalers (even only as needed) or a CPAP machine, please bring them with you on the day of your procedure.  We are giving you Dexilant samples today. Take 1 every morning 30 minutes before breakfast

## 2013-01-09 ENCOUNTER — Telehealth: Payer: Self-pay | Admitting: Internal Medicine

## 2013-01-09 DIAGNOSIS — H10509 Unspecified blepharoconjunctivitis, unspecified eye: Secondary | ICD-10-CM | POA: Diagnosis not present

## 2013-01-09 NOTE — Telephone Encounter (Signed)
Pt was seen by Tye Savoy NP yesterday for dysphagia. Pt has been scheduled for egd with dil 01/26/13. Pt states she cannot wait that long. Pt states even swallowing water is difficult, she ate some jello last night and had to spit it out. Dr. Henrene Pastor would you like to work this pt in sooner? Please advise.

## 2013-01-09 NOTE — Telephone Encounter (Signed)
Spoke with Dr. Henrene Pastor and pt is scheduled for EGD with dil 01/10/13@2pm . Pt to arrive at 1pm. Pt aware of prep instructions and appt date and time.

## 2013-01-10 ENCOUNTER — Ambulatory Visit (AMBULATORY_SURGERY_CENTER): Payer: Medicare Other | Admitting: Internal Medicine

## 2013-01-10 ENCOUNTER — Encounter: Payer: Self-pay | Admitting: Internal Medicine

## 2013-01-10 VITALS — BP 117/77 | HR 60 | Temp 97.4°F | Resp 16 | Ht 61.0 in | Wt 137.0 lb

## 2013-01-10 DIAGNOSIS — E119 Type 2 diabetes mellitus without complications: Secondary | ICD-10-CM | POA: Diagnosis not present

## 2013-01-10 DIAGNOSIS — K222 Esophageal obstruction: Secondary | ICD-10-CM | POA: Diagnosis not present

## 2013-01-10 DIAGNOSIS — K219 Gastro-esophageal reflux disease without esophagitis: Secondary | ICD-10-CM

## 2013-01-10 DIAGNOSIS — I2510 Atherosclerotic heart disease of native coronary artery without angina pectoris: Secondary | ICD-10-CM | POA: Diagnosis not present

## 2013-01-10 DIAGNOSIS — R1310 Dysphagia, unspecified: Secondary | ICD-10-CM | POA: Diagnosis not present

## 2013-01-10 LAB — GLUCOSE, CAPILLARY
Glucose-Capillary: 160 mg/dL — ABNORMAL HIGH (ref 70–99)
Glucose-Capillary: 147 mg/dL — ABNORMAL HIGH (ref 70–99)

## 2013-01-10 MED ORDER — SODIUM CHLORIDE 0.9 % IV SOLN: 500.0000 mL | INTRAVENOUS | Status: DC

## 2013-01-10 NOTE — Patient Instructions (Signed)
Dilated esophagus. Dilation diet today. Resume current medications today. Call us with any questions or concerns. Thank you!!  YOU HAD AN ENDOSCOPIC PROCEDURE TODAY AT [facility]: Refer to the procedure report that was given to you for any specific questions about what was found during the examination.  If the procedure report does not answer your questions, please call your gastroenterologist to clarify.  If you requested that your care partner not be given the details of your procedure findings, then the procedure report has been included in a sealed envelope for you to review at your convenience later.  YOU SHOULD EXPECT: Some feelings of bloating in the abdomen. Passage of more gas than usual.  Walking can help get rid of the air that was put into your GI tract during the procedure and reduce the bloating. If you had a lower endoscopy (such as a colonoscopy or flexible sigmoidoscopy) you may notice spotting of blood in your stool or on the toilet paper. If you underwent a bowel prep for your procedure, then you may not have a normal bowel movement for a few days.  DIET: Dilation diet today!!! See handout. Clear liquids for 1 hour,then soft diet rest of today, then resume prior diet tomorrow. Drink plenty of fluids but you should avoid alcoholic beverages for 24 hours.  ACTIVITY: Your care partner should take you home directly after the procedure.  You should plan to take it easy, moving slowly for the rest of the day.  You can resume normal activity the day after the procedure however you should NOT DRIVE or use heavy machinery for 24 hours (because of the sedation medicines used during the test).    SYMPTOMS TO REPORT IMMEDIATELY: A gastroenterologist can be reached at any hour.  During normal business hours, 8:30 AM to 5:00 PM Monday through Friday, call [phone].  After hours and on weekends, please call the GI answering service at [phone] who will take a message and  have the physician on call contact you.   Following upper endoscopy (EGD)  Vomiting of blood or coffee ground material  New chest pain or pain under the shoulder blades  Painful or persistently difficult swallowing  New shortness of breath  Fever of 100F or higher  Black, tarry-looking stools  FOLLOW UP: If any biopsies were taken you will be contacted by phone or by letter within the next 1-3 weeks.  Call your gastroenterologist if you have not heard about the biopsies in 3 weeks.  Our staff will call the home number listed on your records the next business day following your procedure to check on you and address any questions or concerns that you may have at that time regarding the information given to you following your procedure. This is a courtesy call and so if there is no answer at the home number and we have not heard from you through the emergency physician on call, we will assume that you have returned to your regular daily activities without incident.  SIGNATURES/CONFIDENTIALITY: You and/or your care partner have signed paperwork which will be entered into your electronic medical record.  These signatures attest to the fact that that the information above on your After Visit Summary has been reviewed and is understood.  Full responsibility of the confidentiality of this discharge information lies with you and/or your care-partner.

## 2013-01-10 NOTE — Op Note (Signed)
[facility]  [address] [place], [address]   ENDOSCOPY PROCEDURE REPORT  PATIENT: Janet Steele, Janet Steele  MR#: [id number] BIRTHDATE: [birth year] , 54  yrs. old GENDER: Female ENDOSCOPIST: Eustace Quail, MD REFERRED BY:  .  Self / Office PROCEDURE DATE:  01/10/2013 PROCEDURE:  Balloon dilation of esophagus  - 18-19-20mm ASA CLASS:     Class III INDICATIONS:  Dysphagia.   Therapeutic procedure. MEDICATIONS: MAC sedation, administered by CRNA and propofol (Diprivan) 100mg  IV TOPICAL ANESTHETIC: Cetacaine Spray  DESCRIPTION OF PROCEDURE: After the risks benefits and alternatives of the procedure were thoroughly explained, informed consent was obtained.  The LB [id number] endoscope was introduced through the mouth and advanced to the second portion of the duodenum. Without limitations.  The instrument was slowly withdrawn as the mucosa was fully examined.      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  Normal stomach and duodenum.  Hiatal heria on retroflexion.  THERAPY: TTS sequential balloon dilation 18-19-20mm.  Heme and ring disruption with largest diameter only.  Tolerated well.         The scope was then withdrawn from the patient and the procedure completed.  COMPLICATIONS: There were no complications. ENDOSCOPIC IMPRESSION: 1. 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 S/P dilation to 57mm 2. GERD.   RECOMMENDATIONS: 1.  Clear liquids until , then soft foods rest iof day.  Resume prior diet tomorrow. 2.  Continue current meds  REPEAT EXAM:  eSigned:  Eustace Quail, MD 01/10/2013 2:48 PM [id number] Chanda Busing, MD and The Patient

## 2013-01-10 NOTE — Progress Notes (Signed)
Called to room to assist during endoscopic procedure.  Patient ID and intended procedure confirmed with present staff. Received instructions for my participation in the procedure from the performing physician. ewm 

## 2013-01-10 NOTE — Progress Notes (Signed)
Patient did not experience any of the following events: a burn prior to discharge; a fall within the facility; wrong site/side/patient/procedure/implant event; or a hospital transfer or hospital admission upon discharge from the facility. (G8907) Patient did not have preoperative order for IV antibiotic SSI prophylaxis. (G8918)  

## 2013-01-11 ENCOUNTER — Telehealth: Payer: Self-pay

## 2013-01-11 NOTE — Telephone Encounter (Signed)
  Follow up Call-  Call back number 01/10/2013 09/28/2011  Post procedure Call Back phone  # [phone]  [phone]  Permission to leave phone message Yes -     Patient questions:  Do you have a fever, pain , or abdominal swelling? no Pain Score  0 *  Have you tolerated food without any problems? yes  Have you been able to return to your normal activities? yes  Do you have any questions about your discharge instructions: Diet   no Medications  no Follow up visit  no  Do you have questions or concerns about your Care? no  Actions: * If pain score is 4 or above: No action needed, pain <4.

## 2013-01-13 ENCOUNTER — Other Ambulatory Visit: Payer: Self-pay | Admitting: Internal Medicine

## 2013-01-15 ENCOUNTER — Telehealth: Payer: Self-pay | Admitting: Internal Medicine

## 2013-01-15 MED ORDER — DULOXETINE HCL 60 MG PO CPEP: 60.0000 mg | ORAL_CAPSULE | Freq: Every day | ORAL | Status: DC

## 2013-01-15 NOTE — Telephone Encounter (Signed)
RX sent

## 2013-01-15 NOTE — Telephone Encounter (Signed)
refill  CYMBALTA 60 MG TAKE 1 CAPSULE DAILY AS  NEEDED #30 last fill 12.3.13

## 2013-01-20 ENCOUNTER — Other Ambulatory Visit: Payer: Self-pay

## 2013-01-26 ENCOUNTER — Encounter: Payer: Medicare Other | Admitting: Internal Medicine

## 2013-02-26 DIAGNOSIS — H10509 Unspecified blepharoconjunctivitis, unspecified eye: Secondary | ICD-10-CM | POA: Diagnosis not present

## 2013-03-12 DIAGNOSIS — H10509 Unspecified blepharoconjunctivitis, unspecified eye: Secondary | ICD-10-CM | POA: Diagnosis not present

## 2013-03-12 DIAGNOSIS — H01009 Unspecified blepharitis unspecified eye, unspecified eyelid: Secondary | ICD-10-CM | POA: Diagnosis not present

## 2013-03-22 ENCOUNTER — Telehealth: Payer: Self-pay | Admitting: Internal Medicine

## 2013-03-22 NOTE — Telephone Encounter (Signed)
I spoke with patient and offered her to be seen at another facility due to both MD's at [facility] completely booked. Patient insisted that she see's Dr.Hopper for he knows her history , has treated her for years and always gives her the best care. Patient then stated she would like to wait and see Dr.Hopper on Monday 03/26/13, I assured patient that [facility] is full of good doctors and all have access to her history to give her the best care, patient agreed to be seen at [facility] with Webb Silversmith tomorrow (11:45 am), patient requested a late morning appointment.

## 2013-03-22 NOTE — Telephone Encounter (Signed)
Patient Information:  Caller Name: Rennata  Phone: [phone]  Patient: Janet Steele, Janet Steele  Gender: Female  DOB: [birth year]  Age: 79 Years  PCP: Unice Cobble  Office Follow Up:  Does the office need to follow up with this patient?: Yes  Instructions For The Office: Insists on appointment with Dr. Linna Darner and asks for appointment on 4/18.   Symptoms  Reason For Call & Symptoms: Patient calling, states "I am sick".  Frequent cough. Audible wheezing heard over the phone.  Caller uncertain about fever.   Go to ED Now per Cough protocol due to Chest ain present when not coughing.  Reviewed Health History In EMR: Yes  Reviewed Medications In EMR: Yes  Reviewed Allergies In EMR: Yes  Reviewed Surgeries / Procedures: Yes  Date of Onset of Symptoms: 03/14/2013  Treatments Tried: Cough medicine, Mucinex, increased liquid intake  Treatments Tried Worked: No  Guideline(s) Used:  Cough  Disposition Per Guideline:   Go to ED Now  Reason For Disposition Reached:   Chest pain present when not coughing  Advice Given:  Coughing Spasms:  Drink warm fluids. Inhale warm mist (Reason: both relax the airway and loosen up the phlegm).  Call Back If:  Difficulty breathing  You become worse.  Patient Refused Recommendation:  Patient Refused Care Advice  Insists on appointment with Dr. Linna Darner.

## 2013-03-23 ENCOUNTER — Ambulatory Visit: Payer: Medicare Other | Admitting: Internal Medicine

## 2013-03-26 ENCOUNTER — Ambulatory Visit (INDEPENDENT_AMBULATORY_CARE_PROVIDER_SITE_OTHER): Payer: Medicare Other | Admitting: Internal Medicine

## 2013-03-26 ENCOUNTER — Telehealth: Payer: Self-pay | Admitting: General Practice

## 2013-03-26 ENCOUNTER — Encounter: Payer: Self-pay | Admitting: Internal Medicine

## 2013-03-26 VITALS — BP 122/78 | HR 77 | Temp 98.2°F | Wt 133.0 lb

## 2013-03-26 DIAGNOSIS — E1149 Type 2 diabetes mellitus with other diabetic neurological complication: Secondary | ICD-10-CM | POA: Diagnosis not present

## 2013-03-26 DIAGNOSIS — J209 Acute bronchitis, unspecified: Secondary | ICD-10-CM | POA: Diagnosis not present

## 2013-03-26 DIAGNOSIS — R05 Cough: Secondary | ICD-10-CM

## 2013-03-26 LAB — CBC WITH DIFFERENTIAL/PLATELET
Basophils Absolute: 0 10*3/uL (ref 0.0–0.1)
Basophils Relative: 0.1 % (ref 0.0–3.0)
Eosinophils Absolute: 0.2 10*3/uL (ref 0.0–0.7)
Hemoglobin: 14.8 g/dL (ref 12.0–15.0)
MCHC: 33.6 g/dL (ref 30.0–36.0)
MCV: 87.5 fl (ref 78.0–100.0)
Monocytes Absolute: 0.6 10*3/uL (ref 0.1–1.0)
Neutro Abs: 5.1 10*3/uL (ref 1.4–7.7)
Neutrophils Relative %: 63.6 % (ref 43.0–77.0)
RBC: 5.05 Mil/uL (ref 3.87–5.11)
RDW: 13.9 % (ref 11.5–14.6)

## 2013-03-26 LAB — HEMOGLOBIN A1C: Hgb A1c MFr Bld: 7.6 % — ABNORMAL HIGH (ref 4.6–6.5)

## 2013-03-26 MED ORDER — BENZONATATE 200 MG PO CAPS: 200.0000 mg | ORAL_CAPSULE | Freq: Three times a day (TID) | ORAL | Status: DC | PRN

## 2013-03-26 MED ORDER — AZITHROMYCIN 250 MG PO TABS: ORAL_TABLET | ORAL | Status: DC

## 2013-03-26 MED ORDER — PREDNISONE 20 MG PO TABS: 20.0000 mg | ORAL_TABLET | Freq: Two times a day (BID) | ORAL | Status: DC

## 2013-03-26 NOTE — Telephone Encounter (Signed)
Hopp please advise  

## 2013-03-26 NOTE — Telephone Encounter (Signed)
Pt called stating she seen Dr. Linna Darner today. Advised that she thought she was suppose to receive an Rx for Tessalon  To help with her cough. This was not discussed per his office note. Please advise.

## 2013-03-26 NOTE — Telephone Encounter (Signed)
I am sorry; prescription did not go through. This is for generic Tessalon 200 mg one every 8 hours as needed for cough dispense 20

## 2013-03-26 NOTE — Patient Instructions (Addendum)
Order for x-rays entered into  the computer; these will be performed at [place]. across from [facility]. No appointment is necessary.

## 2013-03-26 NOTE — Telephone Encounter (Signed)
Rx sent tried to call Pt no answer phone just rang

## 2013-03-26 NOTE — Progress Notes (Signed)
  Subjective:    Patient ID: Janet Steele, female    DOB: 1929-10-07, 79 y.o.   MRN: [id number]  HPI  Symptoms began 03/19/13 as cough productive of thick sputum. Despite Mucinex DM for cough has persisted and has been associated with some shortness of breath and wheezing. Additionally with  cough she's noted some dizziness.  She believes that she had fever associated with chills and sweats 4/16.  No PMH of asthma; smoked age 18- 66, up to 1/2 ppd    Review of Systems There had been no extrinsic symptoms of itchy, watery eyes, sneezing. She denies frontal headache, facial pain, nasal purulence, dental pain, pelvic pain, or otic discharge.  No glucose checks X 4 days. She sees Dr Chalmers Cater  Last EKG 8/13 : QT/QTc 428/430      Objective:   Physical Exam  General appearance:good health ;well nourished; no acute distress or increased work of breathing is present.  No  lymphadenopathy about the head, neck, or axilla noted.   Eyes: No conjunctival inflammation or lid edema is present.   Ears:  External ear exam shows no significant lesions or deformities.  Otoscopic examination reveals clear canals, tympanic membranes are intact bilaterally without bulging, retraction, inflammation or discharge.  Nose:  External nasal examination shows no deformity or inflammation. Nasal mucosa are pink and moist without lesions or exudates. No septal dislocation or deviation.No obstruction to airflow.   Oral exam: Dental hygiene is good; lips and gums are healthy appearing.There is no oropharyngeal erythema or exudate noted.   Neck:  No deformities,  masses, or tenderness noted.   Supple with full range of motion without pain.   Heart:  Normal rate and regular rhythm. S1 and S2 normal without gallop, murmur, click, or rub. S4 w/o other extra sounds.   Lungs:Diffuse , homogenous musical wheezing wheezes. Overall breath sounds decreased w/o rhonchi,rales ,or rubs .No increased work of breathing.     Extremities:  No cyanosis or  Edema. Slight clubbing  noted   Neuro: involuntary jerks of body & head   Skin: Warm & dry          Assessment & Plan:  #1 acute bronchitis with bronchospasm  #2 multiple drug allergies including penicillin, sulfa, and codeine  #3 diabetes questionable control  Plan: See orders & recommendations

## 2013-03-27 NOTE — Telephone Encounter (Signed)
Discuss with patient husband Rx sent to pharmacy.

## 2013-03-29 ENCOUNTER — Ambulatory Visit (INDEPENDENT_AMBULATORY_CARE_PROVIDER_SITE_OTHER)
Admission: RE | Admit: 2013-03-29 | Discharge: 2013-03-29 | Disposition: A | Discharge status: Home / Self Care | Payer: Medicare Other | Source: Ambulatory Visit | Attending: Internal Medicine | Admitting: Internal Medicine

## 2013-03-29 DIAGNOSIS — J209 Acute bronchitis, unspecified: Secondary | ICD-10-CM

## 2013-03-29 DIAGNOSIS — J449 Chronic obstructive pulmonary disease, unspecified: Secondary | ICD-10-CM | POA: Diagnosis not present

## 2013-04-05 ENCOUNTER — Ambulatory Visit (INDEPENDENT_AMBULATORY_CARE_PROVIDER_SITE_OTHER): Payer: Medicare Other | Admitting: Internal Medicine

## 2013-04-05 ENCOUNTER — Encounter: Payer: Self-pay | Admitting: Internal Medicine

## 2013-04-05 VITALS — BP 110/66 | HR 80 | Temp 97.5°F | Wt 128.0 lb

## 2013-04-05 DIAGNOSIS — T50905A Adverse effect of unspecified drugs, medicaments and biological substances, initial encounter: Secondary | ICD-10-CM

## 2013-04-05 DIAGNOSIS — T50904A Poisoning by unspecified drugs, medicaments and biological substances, undetermined, initial encounter: Secondary | ICD-10-CM

## 2013-04-05 DIAGNOSIS — R112 Nausea with vomiting, unspecified: Secondary | ICD-10-CM | POA: Diagnosis not present

## 2013-04-05 DIAGNOSIS — R413 Other amnesia: Secondary | ICD-10-CM

## 2013-04-05 DIAGNOSIS — R259 Unspecified abnormal involuntary movements: Secondary | ICD-10-CM | POA: Diagnosis not present

## 2013-04-05 NOTE — Patient Instructions (Signed)
Review and correct the record as indicated. Please share record with all medical staff seen.  

## 2013-04-05 NOTE — Progress Notes (Signed)
  Subjective:    Patient ID: Janet Steele, female    DOB: 05/01/1929, 79 y.o.   MRN: [id number]  HPI  The asthmatic bronchitis related symptoms have resolved with the Zithromax, generic Tessalon Perles, and prednisone. The prednisone was stopped 4/27 or 4/28 because of nausea and vomiting last week up to 3 X/ night .This resolved as of  4/29 .  Last week she had profound memory loss, unable to remember friends' names and phone numbers. She was unaware of the acute memory deficit.     Review of Systems   Her balance has been unstable. She denies any significant headache , head trauma, or loss of consciousness.  She initially stated that her fasting sugars were "probably" 170 but then stated that they were "not that high". She has a followup visit with her endocrinologist next week.        Objective:   Physical Exam   General appearance :adequately nourished w/o distress.  Eyes: No conjunctival inflammation or scleral icterus is present.  Oral exam: Upper denture; lower partial in need of repair.Lips and gums are healthy appearing.There is no oropharyngeal erythema or exudate noted. Tongue moist  Heart:  Normal rate and regular rhythm. S1 and S2 normal without gallop, murmur, click, rub or other extra sounds     Lungs:Chest essentially  clear to auscultation; minor basilar rales  present.No increased work of breathing.   Abdomen: bowel sounds normal, soft and  Slightly tender diffusely ("it's always sore ") without masses, organomegaly or hernias noted.  No guarding or rebound   Skin:Warm & dry.  Intact without suspicious lesions or rashes ; no jaundice or tenting  Lymphatic: No lymphadenopathy is noted about the head, neck, axilla.   Intermittent myoclonic jerking.  Mini-Mental Status exam 27/30          Assessment & Plan:  #1 acute memory deficit by history in the context of nausea and vomiting attributed to prednisone. Mild memory impairment as per Mini-Mental  Status exam. Rule out steroid psychosis versus acute electrolyte imbalance.  #2 asthmatic bronchitis resolved  #3 involuntary muscle activity  Plan: See orders

## 2013-04-08 ENCOUNTER — Other Ambulatory Visit: Payer: Self-pay | Admitting: Internal Medicine

## 2013-04-09 ENCOUNTER — Other Ambulatory Visit: Payer: Self-pay | Admitting: Internal Medicine

## 2013-04-09 DIAGNOSIS — E785 Hyperlipidemia, unspecified: Secondary | ICD-10-CM

## 2013-04-09 DIAGNOSIS — T887XXA Unspecified adverse effect of drug or medicament, initial encounter: Secondary | ICD-10-CM

## 2013-04-09 LAB — BASIC METABOLIC PANEL WITH GFR
BUN: 16 mg/dL (ref 6–23)
CO2: 28 meq/L (ref 19–32)
Calcium: 9.6 mg/dL (ref 8.4–10.5)
Chloride: 99 meq/L (ref 96–112)
Creatinine, Ser: 1.2 mg/dL (ref 0.4–1.2)
GFR: 46.67 mL/min — ABNORMAL LOW
Glucose, Bld: 136 mg/dL — ABNORMAL HIGH (ref 70–99)
Potassium: 4.5 meq/L (ref 3.5–5.1)
Sodium: 133 meq/L — ABNORMAL LOW (ref 135–145)

## 2013-04-09 NOTE — Telephone Encounter (Signed)
Med filled for 30 days. Pt due for labs.

## 2013-05-01 ENCOUNTER — Other Ambulatory Visit: Payer: Self-pay

## 2013-05-01 MED ORDER — GUANFACINE HCL 1 MG PO TABS: 1.5000 mg | ORAL_TABLET | Freq: Every day | ORAL | Status: DC

## 2013-05-01 NOTE — Telephone Encounter (Signed)
Former [facility] patient assigned to Dr Rexene Alberts.

## 2013-05-03 ENCOUNTER — Other Ambulatory Visit: Payer: Self-pay | Admitting: *Deleted

## 2013-05-03 ENCOUNTER — Encounter: Payer: Self-pay | Admitting: Neurology

## 2013-05-03 ENCOUNTER — Ambulatory Visit (INDEPENDENT_AMBULATORY_CARE_PROVIDER_SITE_OTHER): Payer: Medicare Other | Admitting: Neurology

## 2013-05-03 VITALS — BP 114/74 | HR 79 | Temp 98.1°F | Ht 61.0 in | Wt 135.0 lb

## 2013-05-03 DIAGNOSIS — F952 Tourette's disorder: Secondary | ICD-10-CM

## 2013-05-03 NOTE — Patient Instructions (Addendum)
Remember to drink plenty of fluid, eat healthy meals and do not skip any meals. Try to eat protein with a every meal and eat a healthy snack such as fruit or nuts in between meals. Try to keep a regular sleep-wake schedule and try to exercise daily, particularly in the form of walking, 20-30 minutes a day, if you can.   As far as your medications are concerned, I would like to suggest no changes. Please call us for refills on your guanfacine when you need it.   As far as diagnostic testing: no new test today.   I would like to see you back in 6 months, sooner if we need to. Please call us with any interim questions, concerns, problems, updates or refill requests.  Richardson Landry is my clinical assistant and will answer any of your questions and relay your messages to me and also relay most of my messages to you.  Our phone number is [phone]. We also have an after hours call service for urgent matters and there is a physician on-call for urgent questions. For any emergencies you know to call 911 or go to the nearest emergency room.

## 2013-05-03 NOTE — Progress Notes (Signed)
Subjective:    Patient ID: Janet Steele is a 79 y.o. female.  HPI  Interim history:   Janet Steele is a very pleasant 79 year old right-handed woman who presents for followup consultation of her abnormal involuntary movements since age 24. She is unaccompanied today. This is her first visit with me and she previously followed with Dr. Morene Antu and was last seen by him on 11/17/2012, at which time Dr. Erling Cruz felt that she was stable in her exam and that her involuntary movements were less than in the past. He did not increase her guanfacine at the time. She has an underlying medical history of heart disease, status post bypass surgery in 1982 with 5 vessels and repeat two-vessel bypass surgery in 2002, hyperlipidemia, diabetes, thyroid disease, lower back disease with L4-5 hemilaminectomy for right L5 radiculopathy. 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 She is currently on vitamin D, guanfacine 1 mg strength 1-1/2 pills at night, levothyroxine, daily aspirin, Amaryl, Cymbalta, trazodone, Dexilant and metformin.   I reviewed Dr. Tressia Danas prior notes and the patient's records and below is a summary of that review:  79 year old right-handed woman with a history of RLS, involuntary head and facial movement is and denies an essential tremor, right hemilaminectomy, heart disease, sleep disorder, axonal neuropathy, diabetes, anxiety and memory problems. She has exhibited tic-like movements in her face which began in childhood. At age 66 or 51 she was evaluated at the [facility]. She was told that she had brain damage. She was taken off of Xanax. There is no history of coprolalia. She does make grunting sounds. There is no family history of movement disorder. She had some response with trazodone and a trial of clonidine for Tourette's  syndrome was not effective and caused GI problems. She has been on guanfacine since April of last year.   She has no new complaints. She has no other new symptoms. Her RLS symptoms are only mildly bothersome at night. Her involuntary movements do not bother her and she reports no recent falls.    Her Past Medical History Is Significant For: Past Medical History  Diagnosis Date  . Hypothyroidism   . Hyperlipidemia   . GERD (gastroesophageal reflux disease)   . Anxiety   . Diabetes mellitus   . Hypertension   . PVD (peripheral vascular disease)   . Carotid bruit   . Other dysphagia   . Other esophagitis   . Hemorrhoid   . Other constipation   . Colon, diverticulosis   . Hiatal hernia   . Osteoarthrosis, unspecified whether generalized or localized, unspecified site   . Degenerative joint disease   . Esophageal stricture   . Unspecified hearing loss   . Diastolic dysfunction   . CAD (coronary artery disease)   . Shingles     Her Past Surgical History Is Significant For: Past Surgical History  Procedure Laterality Date  . Appendectomy    . Cholecystectomy    . Abdominal hysterectomy    . Oophorectomy      bilateral  . Coronary artery bypass graft      x4(1982),02-2002 CABG X2  . Back surgery  01-2000  . Pacemaker insertion  07/06/12    MDT Adapta L implanted by Dr Rayann Heman for symptomatic bradycardia    Her Family History Is Significant For: Family History  Problem Relation  Age of Onset  . Heart attack Father   . Heart disease Sister   . Colon cancer Neg Hx     Her Social History Is Significant For: History   Social History  . Marital Status: Married    Spouse Name: N/A    Number of Children: N/A  . Years of Education: N/A   Occupational History  . Retired     Social History Main Topics  . Smoking status: Former Smoker    Types: Cigarettes    Quit date: 07/05/1976  . Smokeless tobacco: Never Used     Comment: smoked age18- 1975, up to 1/2 ppd  . Alcohol  Use: 2.4 oz/week    4 Glasses of wine per week  . Drug Use: No  . Sexually Active: No   Other Topics Concern  . Not on file   Social History Narrative  . No narrative on file    Her Allergies Are:  Allergies  Allergen Reactions  . Codeine     REACTION: rash  . Morphine     REACTION: hives  . Penicillins     REACTION: itching/rash  . Sulfonamide Derivatives     REACTION: itching, rash  . Hydrocodone-Acetaminophen     REACTION: unspecified  . Meperidine Hcl     REACTION: itching  . Metformin     REACTION: nervous  . Metoclopramide Hcl     REACTION: unspecified  . Metoprolol Succinate     REACTION: nervous  . Moxifloxacin     REACTION: itching  . Oxycodone-Aspirin     REACTION: rash  . Pentazocine Lactate     REACTION: ? nausea  . Pioglitazone     REACTION: bloating  . Prednisone     MEMORY LOSS   . Tramadol Hcl     REACTION: unspecified  :   Her Current Medications Are:  Outpatient Encounter Prescriptions as of 05/03/2013  Medication Sig Dispense Refill  . acetaminophen (TYLENOL) 325 MG tablet Take 1-2 tablets (325-650 mg total) by mouth every 4 (four) hours as needed for pain or fever.      Marland Kitchen aspirin 325 MG EC tablet Take 325 mg by mouth daily.        Marland Kitchen azithromycin (ZITHROMAX) 250 MG tablet       . benzonatate (TESSALON) 200 MG capsule       . CRESTOR 5 MG tablet take 1 tablet by mouth once daily  30 tablet  0  . DULoxetine (CYMBALTA) 60 MG capsule Take 1 capsule (60 mg total) by mouth daily.  30 capsule  5  . FML LIQUIFILM 0.1 % ophthalmic suspension       . glimepiride (AMARYL) 2 MG tablet Take 2 mg by mouth daily before breakfast.       . glucose blood (FREESTYLE LITE) test strip Test once a day or as needed. Dx 250.00 Freestyle lite test strip  100 each  2  . guanFACINE (TENEX) 1 MG tablet Take 1.5 tablets (1.5 mg total) by mouth at bedtime.  45 tablet  0  . Lancets (FREESTYLE) lancets Test once a day or as needed. Dx 250.00 Freestyle lite lancet  100  each  2  . levothyroxine (SYNTHROID, LEVOTHROID) 88 MCG tablet Take 88 mcg by mouth daily.      Marland Kitchen loteprednol (ALREX) 0.2 % SUSP Place 1 drop into both eyes daily as needed. For itchy eyes      . metFORMIN (GLUCOPHAGE) 500 MG tablet Take 500 mg by mouth  daily with breakfast.       . nitroGLYCERIN (NITROSTAT) 0.4 MG SL tablet Place 0.4 mg under the tongue every 5 (five) minutes as needed. For chest pain      . predniSONE (DELTASONE) 20 MG tablet       . traZODone (DESYREL) 50 MG tablet Take 50 mg by mouth at bedtime as needed. Take 1 1/2 as needed       No facility-administered encounter medications on file as of 05/03/2013.   Review of Systems  All other systems reviewed and are negative.    Objective:  Neurologic Exam  Physical Exam Physical Examination:   Filed Vitals:   05/03/13 1357  BP: 114/74  Pulse: 79  Temp: 98.1 F (36.7 C)    General Examination: The patient is a very pleasant 79 y.o. female in no acute distress. She appears well-developed and well-nourished and well groomed.   HEENT: Normocephalic, atraumatic, pupils are equal, round and reactive to light and accommodation. Extraocular tracking is good without limitation to gaze excursion or nystagmus noted. Normal smooth pursuit is noted. Hearing is grossly intact. She makes intermittent, very mild grunting sounds and sniffing sounds and has intermittent head shaking tics and excessive eye blinking tics at times. Face is symmetric with normal facial animation and normal facial sensation otherwise. Speech is clear with no dysarthria noted. There is no hypophonia. There is no lip, neck/head, jaw or voice tremor. Neck is supple with full range of passive and active motion. There are no carotid bruits on auscultation. Oropharynx exam reveals: mild mouth dryness, adequate dental hygiene and no significant airway crowding. Mallampati is class II. Tongue protrudes centrally and palate elevates symmetrically.  Chest: Clear to  auscultation without wheezing, rhonchi or crackles noted.  Heart: S1+S2+0, regular and normal without murmurs, rubs or gallops noted.   Abdomen: Soft, non-tender and non-distended with normal bowel sounds appreciated on auscultation.  Extremities: There is no pitting edema in the distal lower extremities bilaterally.   Skin: Warm and dry without trophic changes noted. There are no varicose veins, but she has spider veins and unremarkable scars from vein harvesting for her CABG.   Musculoskeletal: exam reveals no obvious joint deformities, tenderness or joint swelling or erythema.   Neurologically:  Mental status: The patient is awake, alert and oriented in all 4 spheres. Her memory, attention, language and knowledge are mildly impaired. There is no aphasia, agnosia, apraxia or anomia. Speech is clear with normal prosody and enunciation. Thought process is linear. Mood is congruent and affect is normal.  Cranial nerves are as described above under HEENT exam. In addition, shoulder shrug is normal with equal shoulder height noted. Motor exam: Normal bulk, strength and tone is noted. There is no drift, tremor or rebound. She has intermittent truncal jerk-like movements that resemble myoclonus but is most likely intact. She is able to suppress the movement to some degree. Romberg is negative. Reflexes are 2+ throughout. Fine motor skills are intact with normal finger taps, normal hand movements, normal rapid alternating patting, normal foot taps and normal foot agility.  Cerebellar testing shows no dysmetria or intention tremor on finger to nose testing. There is no truncal or gait ataxia.  Sensory exam is intact to light touch, pinprick, vibration, temperature sense in the upper and lower extremities.  Gait, station and balance are unremarkable. No veering to one side is noted. No leaning to one side is noted. Posture is age-appropriate and stance is narrow based. No problems turning are noted.  She  has preserved arm swing bilaterally.               Assessment and Plan:   Assessment and Plan:  In summary, Janet Steele is a very pleasant 79 y.o.-year old female with a  long-standing history of phonic and motor tics, most likely Tourette's syndrome. She has tried medications in the past and is currently on guanfacine. I will not make any changes to her medication she has remained stable in the past several months. She is encouraged to exercise regularly in the form of walking. She is advised to keep well hydrated since she did look a little on the dry side. She is advised to follow up in 6 months from now, sooner if the need arises. She did not require refill on 1 was seen today and is encouraged to call for a refill in the future. She is encouraged to followup with her primary care physician routinely as well. She was in agreement.

## 2013-05-09 DIAGNOSIS — H10509 Unspecified blepharoconjunctivitis, unspecified eye: Secondary | ICD-10-CM | POA: Diagnosis not present

## 2013-05-15 ENCOUNTER — Telehealth: Payer: Self-pay | Admitting: General Practice

## 2013-05-15 ENCOUNTER — Other Ambulatory Visit: Payer: Self-pay | Admitting: Neurological Surgery

## 2013-05-15 DIAGNOSIS — M545 Low back pain: Secondary | ICD-10-CM

## 2013-05-15 NOTE — Telephone Encounter (Signed)
Trazodone refill request Last OV 04-05-2013 Med does not show as [place] filling.  Please advise.

## 2013-05-15 NOTE — Telephone Encounter (Signed)
Please verify the prescribing physician; this may have come from her neurologist

## 2013-05-16 NOTE — Telephone Encounter (Signed)
Per pharmacy this medication was last filled by Dr. Erling Cruz (retired). Spoke with Pt husband who stated that she is still seen by that office. Notified them that they would have to contact them for medication refill.

## 2013-05-16 NOTE — Telephone Encounter (Signed)
Called [facility] (closed until 9am). Will try then to see who has been prescribing meds.

## 2013-05-16 NOTE — Telephone Encounter (Signed)
This can be filled until she sees Dr Tat, our neurologist. Dr Tat can determine whether it is appropriate to continue this medication or whether it should be changed.

## 2013-05-16 NOTE — Telephone Encounter (Signed)
Patient is calling back. States that Dr. Tressia Danas former office is telling her to call our office to fill prescription. Patient does not understand why getting her medication filled is so complicated. Does patient need an appointment in order to have this medication filled?

## 2013-05-17 ENCOUNTER — Telehealth: Payer: Self-pay | Admitting: Internal Medicine

## 2013-05-17 ENCOUNTER — Other Ambulatory Visit: Payer: Self-pay | Admitting: General Practice

## 2013-05-17 ENCOUNTER — Ambulatory Visit
Admission: RE | Admit: 2013-05-17 | Discharge: 2013-05-17 | Disposition: A | Discharge status: Home / Self Care | Payer: Medicare Other | Source: Ambulatory Visit | Attending: Neurological Surgery | Admitting: Neurological Surgery

## 2013-05-17 DIAGNOSIS — M545 Low back pain: Secondary | ICD-10-CM

## 2013-05-17 DIAGNOSIS — M47817 Spondylosis without myelopathy or radiculopathy, lumbosacral region: Secondary | ICD-10-CM | POA: Diagnosis not present

## 2013-05-17 MED ORDER — METHYLPREDNISOLONE ACETATE 40 MG/ML INJ SUSP (RADIOLOG
120.0000 mg | Freq: Once | INTRAMUSCULAR | Status: AC
  Administered 2013-05-17: 120 mg via EPIDURAL

## 2013-05-17 MED ORDER — IOHEXOL 180 MG/ML  SOLN
1.0000 mL | Freq: Once | INTRAMUSCULAR | Status: AC | PRN
  Administered 2013-05-17: 1 mL via EPIDURAL

## 2013-05-17 MED ORDER — TRAZODONE HCL 150 MG PO TABS: ORAL_TABLET | ORAL | Status: DC

## 2013-05-17 MED ORDER — TRAZODONE HCL 50 MG PO TABS: 50.0000 mg | ORAL_TABLET | Freq: Every evening | ORAL | Status: DC | PRN

## 2013-05-17 NOTE — Telephone Encounter (Signed)
Per Previous note Pt was notified that Rx was filled. Called Pt back to advise her med was filled. Pt states that she was not made aware of this but will pick up Rx.

## 2013-05-17 NOTE — Telephone Encounter (Signed)
Med filled and pt notified.  

## 2013-05-17 NOTE — Telephone Encounter (Signed)
Patient states that her neurologist will not prescribe the trazadone for her. Patient would like someone to call her back. She states Dr. Linna Darner will need to fill this from now on.

## 2013-06-02 ENCOUNTER — Other Ambulatory Visit: Payer: Self-pay | Admitting: Internal Medicine

## 2013-06-04 NOTE — Telephone Encounter (Signed)
Refill done.  

## 2013-06-27 ENCOUNTER — Other Ambulatory Visit: Payer: Self-pay | Admitting: Internal Medicine

## 2013-07-11 DIAGNOSIS — H2640 Unspecified secondary cataract: Secondary | ICD-10-CM | POA: Diagnosis not present

## 2013-07-11 DIAGNOSIS — H35379 Puckering of macula, unspecified eye: Secondary | ICD-10-CM | POA: Diagnosis not present

## 2013-07-11 DIAGNOSIS — Z961 Presence of intraocular lens: Secondary | ICD-10-CM | POA: Diagnosis not present

## 2013-07-11 DIAGNOSIS — E119 Type 2 diabetes mellitus without complications: Secondary | ICD-10-CM | POA: Diagnosis not present

## 2013-07-17 ENCOUNTER — Other Ambulatory Visit: Payer: Self-pay | Admitting: Internal Medicine

## 2013-07-17 NOTE — Telephone Encounter (Signed)
Future orders placed 

## 2013-07-23 ENCOUNTER — Other Ambulatory Visit: Payer: Self-pay | Admitting: Neurological Surgery

## 2013-07-23 DIAGNOSIS — M545 Low back pain: Secondary | ICD-10-CM

## 2013-08-14 ENCOUNTER — Ambulatory Visit: Payer: Medicare Other | Admitting: Internal Medicine

## 2013-08-15 ENCOUNTER — Other Ambulatory Visit: Payer: Medicare Other

## 2013-08-21 ENCOUNTER — Encounter: Payer: Self-pay | Admitting: Internal Medicine

## 2013-08-21 ENCOUNTER — Ambulatory Visit (INDEPENDENT_AMBULATORY_CARE_PROVIDER_SITE_OTHER): Payer: Medicare Other | Admitting: Internal Medicine

## 2013-08-21 VITALS — BP 135/82 | HR 75 | Temp 98.0°F | Resp 13 | Wt 140.0 lb

## 2013-08-21 DIAGNOSIS — I951 Orthostatic hypotension: Secondary | ICD-10-CM

## 2013-08-21 DIAGNOSIS — G479 Sleep disorder, unspecified: Secondary | ICD-10-CM | POA: Diagnosis not present

## 2013-08-21 NOTE — Progress Notes (Signed)
  Subjective:    Patient ID: Janet Steele, female    DOB: 10-29-1929, 79 y.o.   MRN: [id number]  HPI   Over the last 12-18 months she describes loss of energy in the context of sleep disruption.  She will go to sleep at 9:30-10:30 PM using trazodone as an aide. She will subsequently awakened at least one time during the period 1 AM-5 AM, and is unable to go back to sleep.  Occasionally she will read or  watches TV in bed but not on a consistent basis  She is taking a 2 hour daytime nap on a regular basis  She denies intake of significance stimulants such as decongestants, diet pills, nicotine, caffeine.    Review of Systems There are no travel or work factors in her sleep disruption. She denies being cold that she snores excessively or has stigmata of sleep apnea. No exercise program.  The neurologist note 05/03/13 was reviewed; the Tourette syndrome variant with suggested.Recommendations included a  regular exercise program.  She does describe some postural hypotension symptoms     Objective:   Physical Exam  General appearance:adequately nourished; no acute distress or increased work of breathing is present.  No  lymphadenopathy about the head, neck, or axilla noted.   Eyes: No conjunctival inflammation or lid edema is present.   Ears:  External ear exam shows no significant lesions or deformities.  Otoscopic examination reveals clear canals, tympanic membranes are intact bilaterally without bulging, retraction, inflammation or discharge.  Nose:  External nasal examination shows no deformity or inflammation. Nasal mucosa are pink and moist without lesions or exudates. No septal dislocation or deviation.No obstruction to airflow.   Oral exam: Dentures; lips and gums are healthy appearing.There is no oropharyngeal erythema or exudate noted.   Neck:  No deformities, thyromegaly, masses, or tenderness noted.    Heart:  Normal rate and regular rhythm. S1 and S2 normal without  gallop, murmur, click, rub or other extra sounds.   Lungs:Chest clear to auscultation; no wheezes, rhonchi,rales ,or rubs present.No increased work of breathing.    Extremities:  No cyanosis, edema, or clubbing  noted    Skin: Warm & dry .  Intermittent involuntary head jerks         Assessment & Plan:  #1sleep dysfunction #2 postural hypotension See Plan

## 2013-08-21 NOTE — Patient Instructions (Addendum)
To prevent sleep dysfunction follow these instructions for sleep hygiene. Do not read, watch TV, or eat in bed. Do not get into bed until you are ready to turn off the light &  to go to sleep. Do not ingest stimulants ( decongestants, diet pills, nicotine, caffeine) after the evening meal.Do not take daytime naps.Cardiovascular exercise, this can be as simple a program as walking or water aerobics, is recommended 30-45 minutes 3-4 times per week. If you're not exercising you should take 6-8 weeks to build up to this level. Perform isometric exercise of calves  ( while seated go up on toes to count of 5 & then onto heels for 5 count). Repeat  4- 5 times prior to standing if you've been seated or supine for any significant period of time as BP drops with such positions.

## 2013-08-22 ENCOUNTER — Ambulatory Visit
Admission: RE | Admit: 2013-08-22 | Discharge: 2013-08-22 | Disposition: A | Discharge status: Home / Self Care | Payer: Medicare Other | Source: Ambulatory Visit | Attending: Neurological Surgery | Admitting: Neurological Surgery

## 2013-08-22 ENCOUNTER — Inpatient Hospital Stay: Admission: RE | Admit: 2013-08-22 | Payer: Medicare Other | Source: Ambulatory Visit

## 2013-08-22 DIAGNOSIS — M545 Low back pain: Secondary | ICD-10-CM | POA: Diagnosis not present

## 2013-08-22 DIAGNOSIS — M47817 Spondylosis without myelopathy or radiculopathy, lumbosacral region: Secondary | ICD-10-CM | POA: Diagnosis not present

## 2013-08-22 MED ORDER — IOHEXOL 180 MG/ML  SOLN
1.0000 mL | Freq: Once | INTRAMUSCULAR | Status: AC | PRN
  Administered 2013-08-22: 1 mL via EPIDURAL

## 2013-08-22 MED ORDER — METHYLPREDNISOLONE ACETATE 40 MG/ML INJ SUSP (RADIOLOG
120.0000 mg | Freq: Once | INTRAMUSCULAR | Status: AC
  Administered 2013-08-22: 120 mg via EPIDURAL

## 2013-08-25 DIAGNOSIS — Z23 Encounter for immunization: Secondary | ICD-10-CM | POA: Diagnosis not present

## 2013-08-28 ENCOUNTER — Telehealth: Payer: Self-pay | Admitting: Internal Medicine

## 2013-08-28 NOTE — Telephone Encounter (Signed)
Refill: trazadone 150 mg tablet. Take 1 and 1/2 tablet by mouth at bedtime if needed. Qty 42. Last fill 07-17-13

## 2013-08-28 NOTE — Telephone Encounter (Signed)
Please verify not Rxed by Neurology (previously Dr Alonna Minium these abnormal clinical findings persist, appropriate workup will be completed. The patient understands that follow up is required to elucidate the situation. I Rx this refill OK. Thanks

## 2013-08-29 MED ORDER — TRAZODONE HCL 150 MG PO TABS: ORAL_TABLET | ORAL | Status: DC

## 2013-08-29 NOTE — Telephone Encounter (Signed)
OK x 1 

## 2013-08-29 NOTE — Telephone Encounter (Signed)
Spoke with Neuro. State that per pt she would prefer to get meds all filled with one provider. Have received last two refills from our office. Med filled per [facility] and pt notified of the need for keeping appt with Neuro in Dec.

## 2013-09-10 ENCOUNTER — Other Ambulatory Visit: Payer: Self-pay | Admitting: Internal Medicine

## 2013-09-10 NOTE — Telephone Encounter (Signed)
crestor refill sent to pharmacy

## 2013-09-19 ENCOUNTER — Other Ambulatory Visit: Payer: Self-pay | Admitting: Internal Medicine

## 2013-09-20 ENCOUNTER — Other Ambulatory Visit: Payer: Self-pay | Admitting: *Deleted

## 2013-09-20 MED ORDER — DULOXETINE HCL 60 MG PO CPEP: 60.0000 mg | ORAL_CAPSULE | Freq: Every day | ORAL | Status: DC

## 2013-09-20 NOTE — Telephone Encounter (Signed)
Cymbalta refill sent to pharmacy

## 2013-09-27 DIAGNOSIS — H10509 Unspecified blepharoconjunctivitis, unspecified eye: Secondary | ICD-10-CM | POA: Diagnosis not present

## 2013-10-02 ENCOUNTER — Other Ambulatory Visit: Payer: Self-pay | Admitting: Neurological Surgery

## 2013-10-02 DIAGNOSIS — M545 Low back pain: Secondary | ICD-10-CM

## 2013-10-08 ENCOUNTER — Ambulatory Visit
Admission: RE | Admit: 2013-10-08 | Discharge: 2013-10-08 | Disposition: A | Discharge status: Home / Self Care | Payer: Medicare Other | Source: Ambulatory Visit | Attending: Neurological Surgery | Admitting: Neurological Surgery

## 2013-10-08 DIAGNOSIS — M545 Low back pain: Secondary | ICD-10-CM

## 2013-10-08 DIAGNOSIS — M48061 Spinal stenosis, lumbar region without neurogenic claudication: Secondary | ICD-10-CM | POA: Diagnosis not present

## 2013-10-08 DIAGNOSIS — M47817 Spondylosis without myelopathy or radiculopathy, lumbosacral region: Secondary | ICD-10-CM | POA: Diagnosis not present

## 2013-10-08 MED ORDER — IOHEXOL 180 MG/ML  SOLN
1.0000 mL | Freq: Once | INTRAMUSCULAR | Status: AC | PRN
  Administered 2013-10-08: 1 mL via EPIDURAL

## 2013-10-08 MED ORDER — METHYLPREDNISOLONE ACETATE 40 MG/ML INJ SUSP (RADIOLOG
120.0000 mg | Freq: Once | INTRAMUSCULAR | Status: AC
  Administered 2013-10-08: 120 mg via EPIDURAL

## 2013-10-11 ENCOUNTER — Other Ambulatory Visit: Payer: Self-pay

## 2013-10-18 DIAGNOSIS — M4310 Spondylolisthesis, site unspecified: Secondary | ICD-10-CM | POA: Diagnosis not present

## 2013-11-01 ENCOUNTER — Emergency Department (HOSPITAL_COMMUNITY): Payer: Medicare Other

## 2013-11-01 ENCOUNTER — Encounter (HOSPITAL_COMMUNITY): Payer: Self-pay | Admitting: Emergency Medicine

## 2013-11-01 ENCOUNTER — Emergency Department (HOSPITAL_COMMUNITY)
Admission: EM | Admit: 2013-11-01 | Discharge: 2013-11-01 | Disposition: A | Discharge status: Home / Self Care | Payer: Medicare Other | Attending: Emergency Medicine | Admitting: Emergency Medicine

## 2013-11-01 DIAGNOSIS — Y929 Unspecified place or not applicable: Secondary | ICD-10-CM | POA: Insufficient documentation

## 2013-11-01 DIAGNOSIS — Z87891 Personal history of nicotine dependence: Secondary | ICD-10-CM | POA: Diagnosis not present

## 2013-11-01 DIAGNOSIS — R296 Repeated falls: Secondary | ICD-10-CM | POA: Insufficient documentation

## 2013-11-01 DIAGNOSIS — Z8719 Personal history of other diseases of the digestive system: Secondary | ICD-10-CM | POA: Insufficient documentation

## 2013-11-01 DIAGNOSIS — Z951 Presence of aortocoronary bypass graft: Secondary | ICD-10-CM | POA: Diagnosis not present

## 2013-11-01 DIAGNOSIS — Z950 Presence of cardiac pacemaker: Secondary | ICD-10-CM | POA: Insufficient documentation

## 2013-11-01 DIAGNOSIS — F29 Unspecified psychosis not due to a substance or known physiological condition: Secondary | ICD-10-CM | POA: Diagnosis not present

## 2013-11-01 DIAGNOSIS — R059 Cough, unspecified: Secondary | ICD-10-CM | POA: Diagnosis not present

## 2013-11-01 DIAGNOSIS — E039 Hypothyroidism, unspecified: Secondary | ICD-10-CM | POA: Insufficient documentation

## 2013-11-01 DIAGNOSIS — M1990 Unspecified osteoarthritis, unspecified site: Secondary | ICD-10-CM | POA: Diagnosis not present

## 2013-11-01 DIAGNOSIS — S0990XA Unspecified injury of head, initial encounter: Secondary | ICD-10-CM | POA: Diagnosis not present

## 2013-11-01 DIAGNOSIS — Z8669 Personal history of other diseases of the nervous system and sense organs: Secondary | ICD-10-CM | POA: Diagnosis not present

## 2013-11-01 DIAGNOSIS — Y939 Activity, unspecified: Secondary | ICD-10-CM | POA: Insufficient documentation

## 2013-11-01 DIAGNOSIS — E119 Type 2 diabetes mellitus without complications: Secondary | ICD-10-CM | POA: Diagnosis not present

## 2013-11-01 DIAGNOSIS — Z9181 History of falling: Secondary | ICD-10-CM | POA: Insufficient documentation

## 2013-11-01 DIAGNOSIS — R300 Dysuria: Secondary | ICD-10-CM | POA: Diagnosis not present

## 2013-11-01 DIAGNOSIS — Z79899 Other long term (current) drug therapy: Secondary | ICD-10-CM | POA: Diagnosis not present

## 2013-11-01 DIAGNOSIS — Z792 Long term (current) use of antibiotics: Secondary | ICD-10-CM | POA: Insufficient documentation

## 2013-11-01 DIAGNOSIS — Z7982 Long term (current) use of aspirin: Secondary | ICD-10-CM | POA: Diagnosis not present

## 2013-11-01 DIAGNOSIS — R5381 Other malaise: Secondary | ICD-10-CM | POA: Insufficient documentation

## 2013-11-01 DIAGNOSIS — R0602 Shortness of breath: Secondary | ICD-10-CM | POA: Insufficient documentation

## 2013-11-01 DIAGNOSIS — Z8619 Personal history of other infectious and parasitic diseases: Secondary | ICD-10-CM | POA: Insufficient documentation

## 2013-11-01 DIAGNOSIS — R410 Disorientation, unspecified: Secondary | ICD-10-CM

## 2013-11-01 DIAGNOSIS — I2510 Atherosclerotic heart disease of native coronary artery without angina pectoris: Secondary | ICD-10-CM | POA: Insufficient documentation

## 2013-11-01 DIAGNOSIS — R05 Cough: Secondary | ICD-10-CM | POA: Diagnosis not present

## 2013-11-01 DIAGNOSIS — R531 Weakness: Secondary | ICD-10-CM

## 2013-11-01 DIAGNOSIS — Z880 Allergy status to penicillin: Secondary | ICD-10-CM | POA: Insufficient documentation

## 2013-11-01 DIAGNOSIS — R4182 Altered mental status, unspecified: Secondary | ICD-10-CM | POA: Diagnosis not present

## 2013-11-01 DIAGNOSIS — F411 Generalized anxiety disorder: Secondary | ICD-10-CM | POA: Insufficient documentation

## 2013-11-01 DIAGNOSIS — I10 Essential (primary) hypertension: Secondary | ICD-10-CM | POA: Insufficient documentation

## 2013-11-01 LAB — URINALYSIS, ROUTINE W REFLEX MICROSCOPIC
Glucose, UA: NEGATIVE mg/dL
Hgb urine dipstick: NEGATIVE
Protein, ur: NEGATIVE mg/dL
pH: 6 (ref 5.0–8.0)

## 2013-11-01 LAB — COMPREHENSIVE METABOLIC PANEL
ALT: 22 U/L (ref 0–35)
Albumin: 3.6 g/dL (ref 3.5–5.2)
Alkaline Phosphatase: 102 U/L (ref 39–117)
BUN: 13 mg/dL (ref 6–23)
Calcium: 9.9 mg/dL (ref 8.4–10.5)
Chloride: 91 mEq/L — ABNORMAL LOW (ref 96–112)
Creatinine, Ser: 0.9 mg/dL (ref 0.50–1.10)
GFR calc Af Amer: 67 mL/min — ABNORMAL LOW (ref >90)

## 2013-11-01 LAB — CBC WITH DIFFERENTIAL/PLATELET
Basophils Absolute: 0 10*3/uL (ref 0.0–0.1)
Basophils Relative: 0 % (ref 0–1)
Eosinophils Relative: 1 % (ref 0–5)
HCT: 39.2 % (ref 36.0–46.0)
Hemoglobin: 14 g/dL (ref 12.0–15.0)
MCH: 30.8 pg (ref 26.0–34.0)
MCHC: 35.7 g/dL (ref 30.0–36.0)
MCV: 86.3 fL (ref 78.0–100.0)
Monocytes Absolute: 0.5 10*3/uL (ref 0.1–1.0)
Monocytes Relative: 6 % (ref 3–12)
Neutro Abs: 6.3 10*3/uL (ref 1.7–7.7)
RDW: 13.7 % (ref 11.5–15.5)

## 2013-11-01 LAB — URINE MICROSCOPIC-ADD ON

## 2013-11-01 MED ORDER — SODIUM CHLORIDE 0.9 % IV BOLUS (SEPSIS)
500.0000 mL | Freq: Once | INTRAVENOUS | Status: AC
  Administered 2013-11-01: 500 mL via INTRAVENOUS

## 2013-11-01 NOTE — ED Provider Notes (Signed)
Care assumed at the change of shift pending CT head which was reviewed and negative. Pt and husband eager to go home. Advised close PCP followup for further evaluation.   Janet Steele B. Karle Starch, MD 11/01/13 1736

## 2013-11-01 NOTE — ED Notes (Signed)
Pt ambulated in hall without assist. Per pt's husband pt walking wnl

## 2013-11-01 NOTE — ED Provider Notes (Signed)
CSN: [id number]     Arrival date & time 11/01/13  1249 History   First MD Initiated Contact with Patient 11/01/13 1257     Chief Complaint  Patient presents with  . Altered Mental Status  . Fall   (Consider location/radiation/quality/duration/timing/severity/associated sxs/prior Treatment) HPI Comments: Patient brought to the emergency department by husband for evaluation of weakness and confusion. Husband reports that over the last 2 or 3 weeks she has had progressively worsening confusion and forgetfulness. In the last 24 hours has become extremely weak and has fallen several times. Husband reports that she is so weak that he had trouble getting her up today. Patient denies any pain or injury. She has not had any chest pain. She has had cough and mild shortness of breath. There also has been pain with urination for the last 2 weeks.  Patient is a 79 y.o. female presenting with altered mental status and fall.  Altered Mental Status Presenting symptoms: confusion   Associated symptoms: weakness (generalized)   Associated symptoms: no headaches   Fall Associated symptoms include shortness of breath. Pertinent negatives include no headaches.    Past Medical History  Diagnosis Date  . Hypothyroidism   . Hyperlipidemia   . GERD (gastroesophageal reflux disease)   . Anxiety   . Diabetes mellitus   . Hypertension   . PVD (peripheral vascular disease)   . Carotid bruit   . Other dysphagia   . Other esophagitis   . Hemorrhoid   . Other constipation   . Colon, diverticulosis   . Hiatal hernia   . Osteoarthrosis, unspecified whether generalized or localized, unspecified site   . Degenerative joint disease   . Esophageal stricture   . Unspecified hearing loss   . Diastolic dysfunction   . CAD (coronary artery disease)   . Shingles    Past Surgical History  Procedure Laterality Date  . Appendectomy    . Cholecystectomy    . Abdominal hysterectomy    . Oophorectomy     bilateral  . Coronary artery bypass graft      x4(1982),02-2002 CABG X2  . Back surgery  01-2000  . Pacemaker insertion  07/06/12    MDT Adapta L implanted by Dr Rayann Heman for symptomatic bradycardia   Family History  Problem Relation Age of Onset  . Heart attack Father   . Heart disease Sister   . Colon cancer Neg Hx    History  Substance Use Topics  . Smoking status: Former Smoker    Types: Cigarettes    Quit date: 07/05/1976  . Smokeless tobacco: Never Used     Comment: smoked age18- 1977, up to 1/2 ppd  . Alcohol Use: 2.4 oz/week    4 Glasses of wine per week   OB History   Grav Para Term Preterm Abortions TAB SAB Ect Mult Living                 Review of Systems  Respiratory: Positive for cough and shortness of breath.   Genitourinary: Positive for dysuria.  Neurological: Positive for weakness (generalized). Negative for headaches.  Psychiatric/Behavioral: Positive for confusion.  All other systems reviewed and are negative.    Allergies  Codeine; Morphine; Penicillins; Prednisone; Sulfonamide derivatives; Hydrocodone-acetaminophen; Metoclopramide hcl; Tramadol hcl; Meperidine hcl; Metformin; Metoprolol succinate; Moxifloxacin; Oxycodone-aspirin; Pentazocine lactate; and Pioglitazone  Home Medications   Current Outpatient Rx  Name  Route  Sig  Dispense  Refill  . aspirin 325 MG EC tablet   Oral  Take 325 mg by mouth daily.           Marland Kitchen azithromycin (ZITHROMAX) 250 MG tablet               . benzonatate (TESSALON) 200 MG capsule               . CRESTOR 5 MG tablet      take 1 tablet by mouth once daily   30 tablet   5     Pt due for labs.   . DULoxetine (CYMBALTA) 60 MG capsule      take 1 capsule by mouth once daily   30 capsule   5   . DULoxetine (CYMBALTA) 60 MG capsule   Oral   Take 1 capsule (60 mg total) by mouth daily.   30 capsule   5   . glimepiride (AMARYL) 2 MG tablet   Oral   Take 2 mg by mouth daily before breakfast.           . guanFACINE (TENEX) 1 MG tablet      take 1 and 1/2 tablets by mouth once daily at bedtime   45 tablet   5   . levothyroxine (SYNTHROID, LEVOTHROID) 88 MCG tablet   Oral   Take 88 mcg by mouth daily.         Marland Kitchen loteprednol (ALREX) 0.2 % SUSP   Both Eyes   Place 1 drop into both eyes daily as needed. For itchy eyes         . metFORMIN (GLUCOPHAGE) 500 MG tablet   Oral   Take 500 mg by mouth daily with breakfast.          . nitroGLYCERIN (NITROSTAT) 0.4 MG SL tablet   Sublingual   Place 0.4 mg under the tongue every 5 (five) minutes as needed. For chest pain         . traZODone (DESYREL) 150 MG tablet      Take 1 and 1/2 tablets by mouth at bedtime.   45 tablet   2    BP 113/49  Pulse 73  Temp(Src) 98.8 F (37.1 C)  Resp 24  SpO2 96% Physical Exam  Constitutional: She is oriented to person, place, and time. She appears well-developed and well-nourished. No distress.  HENT:  Head: Normocephalic and atraumatic.  Right Ear: Hearing normal.  Left Ear: Hearing normal.  Nose: Nose normal.  Mouth/Throat: Oropharynx is clear and moist and mucous membranes are normal.  Eyes: Conjunctivae and EOM are normal. Pupils are equal, round, and reactive to light.  Neck: Normal range of motion. Neck supple.  Cardiovascular: Regular rhythm, S1 normal and S2 normal.  Exam reveals no gallop and no friction rub.   No murmur heard. Pulmonary/Chest: Effort normal and breath sounds normal. No respiratory distress. She exhibits no tenderness.  Abdominal: Soft. Normal appearance and bowel sounds are normal. There is no hepatosplenomegaly. There is no tenderness. There is no rebound, no guarding, no tenderness at McBurney's point and negative Murphy's sign. No hernia.  Musculoskeletal: Normal range of motion.  Neurological: She is alert and oriented to person, place, and time. She has normal strength. No cranial nerve deficit or sensory deficit. Coordination normal. GCS eye subscore  is 4. GCS verbal subscore is 5. GCS motor subscore is 6.  Skin: Skin is warm, dry and intact. No rash noted. No cyanosis.  Psychiatric: She has a normal mood and affect. Her speech is normal and behavior is normal. Thought content  normal.    ED Course  Procedures (including critical care time) Labs Review Labs Reviewed  CBC WITH DIFFERENTIAL - Abnormal; Notable for the following:    Neutrophils Relative % 82 (*)    Lymphocytes Relative 10 (*)    All other components within normal limits  COMPREHENSIVE METABOLIC PANEL - Abnormal; Notable for the following:    Sodium 128 (*)    Chloride 91 (*)    Glucose, Bld 168 (*)    AST 72 (*)    GFR calc non Af Amer 58 (*)    GFR calc Af Amer 67 (*)    All other components within normal limits  PRO B NATRIURETIC PEPTIDE - Abnormal; Notable for the following:    Pro B Natriuretic peptide (BNP) 567.7 (*)    All other components within normal limits  URINALYSIS, ROUTINE W REFLEX MICROSCOPIC - Abnormal; Notable for the following:    Leukocytes, UA TRACE (*)    All other components within normal limits  TROPONIN I  URINE MICROSCOPIC-ADD ON   Imaging Review No results found.  EKG Interpretation    Date/Time:  Thursday November 01 2013 12:58:46 EST Ventricular Rate:  73 PR Interval:  152 QRS Duration: 115 QT Interval:  389 QTC Calculation: 429 R Axis:   94 Text Interpretation:  Age not entered, assumed to be  79 years old for purpose of ECG interpretation Sinus rhythm Probable left atrial enlargement Nonspecific intraventricular conduction delay Inferior infarct, age indeterminate Lateral leads are also involved Baseline wander in lead(s) I II aVR V1 V2 V3 V4 inferior-lateral changes are new since last tracing Confirmed by Kimon Loewen  MD, [place] ([id number]) on 11/01/2013 1:06:00 PM            MDM  Diagnosis: 1. Contusion 2. Generalized weakness  She presents to the ER for evaluation of generalized weakness with confusion. Husband  reports that she has had some confusion for a while, but no last couple of weeks symptoms have changed. She has fallen because of the generalized weakness. There has not been any vomiting or diarrhea. No focal symptoms. Patient denies any complaints at time of arrival, although she apparently did complain of dysuria earlier. However, urinalysis was unremarkable. She was given a fluid bolus as she has not been eating or drinking, appears well. CAT scan of the head was performed and was negative. Patient discharged into care of her husband.    Orpah Greek, MD 11/05/13 [phone]

## 2013-11-01 NOTE — ED Notes (Signed)
Pt returned from X-ray.  

## 2013-11-01 NOTE — ED Notes (Signed)
Per family pt  Has been having confusion increasing and falling alot

## 2013-11-01 NOTE — ED Notes (Signed)
Phlebotomy at bedside.

## 2013-11-05 ENCOUNTER — Ambulatory Visit: Payer: Medicare Other | Admitting: Neurology

## 2013-11-08 ENCOUNTER — Ambulatory Visit: Payer: Medicare Other | Admitting: Internal Medicine

## 2013-11-21 ENCOUNTER — Other Ambulatory Visit: Payer: Self-pay | Admitting: *Deleted

## 2013-11-21 MED ORDER — TRAZODONE HCL 150 MG PO TABS: ORAL_TABLET | ORAL | Status: DC

## 2013-12-10 ENCOUNTER — Ambulatory Visit (INDEPENDENT_AMBULATORY_CARE_PROVIDER_SITE_OTHER): Payer: Medicare Other | Admitting: Internal Medicine

## 2013-12-10 ENCOUNTER — Encounter: Payer: Self-pay | Admitting: Internal Medicine

## 2013-12-10 VITALS — BP 131/74 | HR 95 | Temp 98.0°F | Resp 18 | Wt 139.0 lb

## 2013-12-10 DIAGNOSIS — R112 Nausea with vomiting, unspecified: Secondary | ICD-10-CM

## 2013-12-10 DIAGNOSIS — IMO0001 Reserved for inherently not codable concepts without codable children: Secondary | ICD-10-CM

## 2013-12-10 DIAGNOSIS — G479 Sleep disorder, unspecified: Secondary | ICD-10-CM | POA: Diagnosis not present

## 2013-12-10 DIAGNOSIS — R0609 Other forms of dyspnea: Secondary | ICD-10-CM | POA: Diagnosis not present

## 2013-12-10 DIAGNOSIS — R0600 Dyspnea, unspecified: Secondary | ICD-10-CM

## 2013-12-10 DIAGNOSIS — R0989 Other specified symptoms and signs involving the circulatory and respiratory systems: Secondary | ICD-10-CM | POA: Diagnosis not present

## 2013-12-10 DIAGNOSIS — E871 Hypo-osmolality and hyponatremia: Secondary | ICD-10-CM

## 2013-12-10 NOTE — Patient Instructions (Signed)
Your next office appointment will be determined based upon review of your pending labs &/ or x-rays. Those instructions will be transmitted to you  by mail .  I recommend a Sleep Medicine consultation to determine optimal therapy; please inform me if you have a physician preference.  Followup as needed for your this acute issue. Please report any significant change in your symptoms.

## 2013-12-10 NOTE — Progress Notes (Signed)
Subjective:    Patient ID: Janet Steele, female    DOB: May 22, 1929, 80 y.o.   MRN: [id number]  HPI   She describes nausea and vomiting approximately 2 times a week; there is no specific food trigger. She also describes  diffuse soreness.She has occasional , sharp mid abdominal pain lasting up to 1 hour.    She was seen in the emergency room 11/01/13 with generalized weakness which resulted in a fall. At that time her sodium was only 128; GFR 58; BNP 567.7. CT scan of the head was negative for acute injury.    Review of Systems   She has no dysphagia. She has  abnormal bruising or bleeding with minor trauma. She has no difficulty stopping bleeding with injury. She denies epistaxis, hemoptysis, hematuria, melena, or rectal bleeding.She has no documented significant weight loss. Disrupted sleep as difficulty going to sleep & frequent awakening with "shortness of breath " (see BNP above). "I must pound on my chest".  She and her husband sleep in separate rooms and she is unaware of any excess snoring or apnea. Significant headaches denied    Objective:   Physical Exam Gen.: Adequately nourished in appearance. Cooperative throughout exam; but rambling , disjointed history.  Head: Normocephalic without obvious abnormalities;no alopecia  Eyes: No corneal or conjunctival inflammation noted. Pupils equal round reactive to light and accommodation. Extraocular motion intact.  Ears: External  ear exam reveals no significant lesions or deformities. Canals clear .TMs normal. Hearing is grossly decreased bilaterally. Nose: External nasal exam reveals no deformity or inflammation. Nasal mucosa are pink and moist. No lesions or exudates noted.  Mouth: Oral mucosa and oropharynx reveal no lesions or exudates. Dentures in good repair. Neck: No deformities, masses, or tenderness noted.  Thyroid small. Lungs: Normal respiratory effort; chest expands symmetrically. Lungs are clear to auscultation without  rales, wheezes, or increased work of breathing. Heart: Normal rate and rhythm. Normal S1 and S2. No gallop, click, or rub. S4 w/o murmur. Abdomen: Bowel sounds normal; abdomen soft and nontender. No masses, organomegaly or hernias noted.                                 Musculoskeletal/extremities:    Accentuated curvature of upper thoracic spine. No clubbing, cyanosis, edema, or significant extremity  deformity noted. Tone  normal. Hand joints normal . Fingernail health good. Able to lie down & sit up with minimal help. Negative SLR bilaterally Vascular: Carotid, radial artery, dorsalis pedis and  posterior tibial pulses are  equal. Decreased DPP.No bruits present. Neurologic: December 31, 2013. She knew Biochemist, clinical.Deep tendon reflexes symmetrical and normal.   Skin:Diffuse bruising Lymph: No cervical, axillary lymphadenopathy present. Psych: Mood and affect are normal; but easily distracted & frustrated. Normally interactive                                                                                        Assessment & Plan:  #1 intermittent nausea and vomiting  #2 sleep disorder; rule out sleep apnea  #3 paroxysmal nocturnal dyspnea, possibly related to #2  #  4 hyponatremia  Plan: See orders Referral to Dr Beacher May declined

## 2013-12-11 LAB — CBC WITH DIFFERENTIAL/PLATELET
BASOS PCT: 0.5 % (ref 0.0–3.0)
Basophils Absolute: 0 10*3/uL (ref 0.0–0.1)
EOS PCT: 6 % — AB (ref 0.0–5.0)
Eosinophils Absolute: 0.5 10*3/uL (ref 0.0–0.7)
HCT: 40.1 % (ref 36.0–46.0)
Hemoglobin: 13.2 g/dL (ref 12.0–15.0)
Lymphocytes Relative: 18.6 % (ref 12.0–46.0)
Lymphs Abs: 1.5 10*3/uL (ref 0.7–4.0)
MCHC: 33 g/dL (ref 30.0–36.0)
MCV: 89.9 fl (ref 78.0–100.0)
Monocytes Absolute: 0.4 10*3/uL (ref 0.1–1.0)
Monocytes Relative: 4.5 % (ref 3.0–12.0)
NEUTROS ABS: 5.6 10*3/uL (ref 1.4–7.7)
NEUTROS PCT: 70.4 % (ref 43.0–77.0)
Platelets: 315 10*3/uL (ref 150.0–400.0)
RBC: 4.46 Mil/uL (ref 3.87–5.11)
RDW: 13.4 % (ref 11.5–14.6)
WBC: 8 10*3/uL (ref 4.5–10.5)

## 2013-12-11 LAB — HEPATIC FUNCTION PANEL
ALBUMIN: 3.9 g/dL (ref 3.5–5.2)
ALK PHOS: 81 U/L (ref 39–117)
ALT: 16 U/L (ref 0–35)
AST: 23 U/L (ref 0–37)
BILIRUBIN DIRECT: 0.1 mg/dL (ref 0.0–0.3)
Total Bilirubin: 0.8 mg/dL (ref 0.3–1.2)
Total Protein: 6.7 g/dL (ref 6.0–8.3)

## 2013-12-11 LAB — BASIC METABOLIC PANEL
BUN: 11 mg/dL (ref 6–23)
CALCIUM: 9.5 mg/dL (ref 8.4–10.5)
CO2: 28 meq/L (ref 19–32)
CREATININE: 0.9 mg/dL (ref 0.4–1.2)
Chloride: 99 mEq/L (ref 96–112)
GFR: 61.32 mL/min (ref >60.00)
GLUCOSE: 135 mg/dL — AB (ref 70–99)
Potassium: 4.5 mEq/L (ref 3.5–5.1)
Sodium: 134 mEq/L — ABNORMAL LOW (ref 135–145)

## 2013-12-11 LAB — SEDIMENTATION RATE: Sed Rate: 0 mm/hr (ref 0–22)

## 2013-12-11 LAB — CK: Total CK: 54 U/L (ref 7–177)

## 2013-12-11 LAB — BRAIN NATRIURETIC PEPTIDE: Pro B Natriuretic peptide (BNP): 56 pg/mL (ref 0.0–100.0)

## 2013-12-14 DIAGNOSIS — L738 Other specified follicular disorders: Secondary | ICD-10-CM | POA: Diagnosis not present

## 2013-12-14 DIAGNOSIS — Z85828 Personal history of other malignant neoplasm of skin: Secondary | ICD-10-CM | POA: Diagnosis not present

## 2013-12-19 ENCOUNTER — Other Ambulatory Visit: Payer: Self-pay | Admitting: *Deleted

## 2013-12-19 DIAGNOSIS — H10509 Unspecified blepharoconjunctivitis, unspecified eye: Secondary | ICD-10-CM | POA: Diagnosis not present

## 2013-12-19 MED ORDER — BENZONATATE 200 MG PO CAPS: 200.0000 mg | ORAL_CAPSULE | Freq: Two times a day (BID) | ORAL | Status: DC | PRN

## 2014-01-02 DIAGNOSIS — H35379 Puckering of macula, unspecified eye: Secondary | ICD-10-CM | POA: Diagnosis not present

## 2014-01-02 DIAGNOSIS — Z961 Presence of intraocular lens: Secondary | ICD-10-CM | POA: Diagnosis not present

## 2014-01-02 DIAGNOSIS — E119 Type 2 diabetes mellitus without complications: Secondary | ICD-10-CM | POA: Diagnosis not present

## 2014-01-02 DIAGNOSIS — H2640 Unspecified secondary cataract: Secondary | ICD-10-CM | POA: Diagnosis not present

## 2014-01-09 ENCOUNTER — Other Ambulatory Visit (HOSPITAL_COMMUNITY): Payer: Self-pay | Admitting: Cardiology

## 2014-01-09 DIAGNOSIS — I6529 Occlusion and stenosis of unspecified carotid artery: Secondary | ICD-10-CM

## 2014-01-15 ENCOUNTER — Ambulatory Visit (HOSPITAL_COMMUNITY): Payer: Medicare Other | Attending: Internal Medicine

## 2014-01-15 DIAGNOSIS — I6529 Occlusion and stenosis of unspecified carotid artery: Secondary | ICD-10-CM

## 2014-01-15 DIAGNOSIS — I658 Occlusion and stenosis of other precerebral arteries: Secondary | ICD-10-CM | POA: Diagnosis not present

## 2014-01-15 DIAGNOSIS — Z951 Presence of aortocoronary bypass graft: Secondary | ICD-10-CM | POA: Insufficient documentation

## 2014-01-15 DIAGNOSIS — E119 Type 2 diabetes mellitus without complications: Secondary | ICD-10-CM | POA: Insufficient documentation

## 2014-01-15 DIAGNOSIS — R42 Dizziness and giddiness: Secondary | ICD-10-CM | POA: Insufficient documentation

## 2014-01-15 DIAGNOSIS — Z87891 Personal history of nicotine dependence: Secondary | ICD-10-CM | POA: Insufficient documentation

## 2014-01-15 DIAGNOSIS — I2510 Atherosclerotic heart disease of native coronary artery without angina pectoris: Secondary | ICD-10-CM | POA: Diagnosis not present

## 2014-01-15 DIAGNOSIS — I10 Essential (primary) hypertension: Secondary | ICD-10-CM | POA: Insufficient documentation

## 2014-01-15 DIAGNOSIS — R0989 Other specified symptoms and signs involving the circulatory and respiratory systems: Secondary | ICD-10-CM | POA: Insufficient documentation

## 2014-01-15 DIAGNOSIS — E785 Hyperlipidemia, unspecified: Secondary | ICD-10-CM | POA: Diagnosis not present

## 2014-01-29 ENCOUNTER — Ambulatory Visit: Payer: Medicare Other | Admitting: Internal Medicine

## 2014-02-04 ENCOUNTER — Ambulatory Visit (INDEPENDENT_AMBULATORY_CARE_PROVIDER_SITE_OTHER): Payer: Medicare Other | Admitting: Internal Medicine

## 2014-02-04 ENCOUNTER — Telehealth: Payer: Self-pay | Admitting: *Deleted

## 2014-02-04 ENCOUNTER — Encounter: Payer: Self-pay | Admitting: Internal Medicine

## 2014-02-04 VITALS — BP 95/65 | HR 72 | Temp 98.0°F | Wt 129.0 lb

## 2014-02-04 DIAGNOSIS — R259 Unspecified abnormal involuntary movements: Secondary | ICD-10-CM

## 2014-02-04 DIAGNOSIS — G479 Sleep disorder, unspecified: Secondary | ICD-10-CM | POA: Diagnosis not present

## 2014-02-04 DIAGNOSIS — I6529 Occlusion and stenosis of unspecified carotid artery: Secondary | ICD-10-CM | POA: Diagnosis not present

## 2014-02-04 MED ORDER — TRAZODONE HCL 150 MG PO TABS: ORAL_TABLET | ORAL | Status: DC

## 2014-02-04 NOTE — Assessment & Plan Note (Signed)
Consult Dr Carles Collet

## 2014-02-04 NOTE — Patient Instructions (Signed)
Please review the medication list in the After Visit Summary provided.Please verify the medication name (this may be  brand or generic) & correct dosage. Write the name of the prescribing physician to the right of the medication and share this with all medical staff seen at each appointment. This will help provide continuity of care; help optimize therapeutic interventions;and help prevent drug:drug adverse reaction.

## 2014-02-04 NOTE — Progress Notes (Signed)
Pre visit review using our clinic review tool, if applicable. No additional management support is needed unless otherwise documented below in the visit note. 

## 2014-02-04 NOTE — Telephone Encounter (Signed)
Requesting Trazodone 150mg  Take 1 and 1/2 tablets by mouth at bedtime if needed. Last refill:01-06-14;#45 Last OV:12-10-13 Please advise.//AB/CMA

## 2014-02-04 NOTE — Progress Notes (Signed)
   Subjective:    Patient ID: Janet Steele, female    DOB: Apr 13, 1929, 80 y.o.   MRN: [id number]  HPI   Her husband feels her memory loss has been an issue for at least a year. It is manifested by such activities as losing things; repeating the same question repeatedly; & forgetting recent conversations.  The family is concerned of what they have stopped her from driving  Both the patient and family expressed frustration about this issue  I wanted her to return with her husband as her history at the last office visit was markedly disjointed, rambling and confusing       Review of Systems  She is taking large doses of trazodone which she believes is for sleep.  Her husband states that she is hypersomnolent and he has difficulty keeping her awake.  The last neurology note suggests that this may have been for her movement disorder for which she previously saw Dr. Erling Steele.  That neurology note was reviewed and entry made in the problem list summarizing the evaluation to date.  Interesting is the fact that her daughter was a Scientist, water quality outside [place]     Objective:   Physical Exam  Gen. appearance: adequately nourished, in no distress Eyes: Extraocular motion intact, field of vision normal, vision grossly intact, no nystagmus Neuro: She exhibits intermittent involuntary jerking motions of her head and neck.  Mini-Mental Status exam revealed a score of 28/30. She missed the year and was also unable to repeat "no it's, hands, or buts".  She was unable to identify the prescribing physicians for her medications. Lymph: No cervical or axillary LA Skin: Warm and dry without suspicious lesions or rashes Psych: She became very frustrated and expressed concerns about the high dose of trazodone. She stated "many of my friends are on this"       Assessment & Plan:  #1 movement disorder; this has been diagnosed as Tourette's. There's been no history of foul, offensive  verbal outburst.  #2 sleep disorder questionable  etiology.  Plan: I will refer her for movement disorder evaluation. I will refill the trazodone one time. If Dr. Carles Steele feels the trazodone is appropriate it can be refilled. If there are concerns about this medication; referral for sleep evaluation be indicated

## 2014-02-04 NOTE — Telephone Encounter (Signed)
OV this afternoon 

## 2014-02-13 ENCOUNTER — Ambulatory Visit (INDEPENDENT_AMBULATORY_CARE_PROVIDER_SITE_OTHER): Payer: Medicare Other | Admitting: Cardiovascular Disease

## 2014-02-13 ENCOUNTER — Encounter: Payer: Self-pay | Admitting: Cardiovascular Disease

## 2014-02-13 VITALS — BP 138/62 | HR 84 | Ht 61.0 in | Wt 129.0 lb

## 2014-02-13 DIAGNOSIS — I25810 Atherosclerosis of coronary artery bypass graft(s) without angina pectoris: Secondary | ICD-10-CM | POA: Diagnosis not present

## 2014-02-13 DIAGNOSIS — E782 Mixed hyperlipidemia: Secondary | ICD-10-CM | POA: Diagnosis not present

## 2014-02-13 DIAGNOSIS — I739 Peripheral vascular disease, unspecified: Secondary | ICD-10-CM

## 2014-02-13 DIAGNOSIS — I498 Other specified cardiac arrhythmias: Secondary | ICD-10-CM | POA: Diagnosis not present

## 2014-02-13 DIAGNOSIS — R001 Bradycardia, unspecified: Secondary | ICD-10-CM

## 2014-02-13 DIAGNOSIS — I10 Essential (primary) hypertension: Secondary | ICD-10-CM

## 2014-02-13 DIAGNOSIS — Z950 Presence of cardiac pacemaker: Secondary | ICD-10-CM | POA: Diagnosis not present

## 2014-02-13 LAB — HEPATIC FUNCTION PANEL
ALK PHOS: 75 U/L (ref 39–117)
ALT: 14 U/L (ref 0–35)
AST: 19 U/L (ref 0–37)
Albumin: 4.1 g/dL (ref 3.5–5.2)
BILIRUBIN TOTAL: 0.8 mg/dL (ref 0.3–1.2)
Bilirubin, Direct: 0.1 mg/dL (ref 0.0–0.3)
Total Protein: 6.8 g/dL (ref 6.0–8.3)

## 2014-02-13 LAB — LIPID PANEL
Cholesterol: 214 mg/dL — ABNORMAL HIGH (ref 0–200)
HDL: 36.6 mg/dL — ABNORMAL LOW (ref >39.00)
LDL Cholesterol: 138 mg/dL — ABNORMAL HIGH (ref 0–99)
Total CHOL/HDL Ratio: 6
Triglycerides: 198 mg/dL — ABNORMAL HIGH (ref 0.0–149.0)
VLDL: 39.6 mg/dL (ref 0.0–40.0)

## 2014-02-13 NOTE — Patient Instructions (Signed)
Your physician recommends that you have lab work today - cholesterol screening  Your physician recommends that you continue on your current medications as directed. Please refer to the Current Medication list given to you today.  Your physician wants you to follow-up in: 1 year with Dr. Burt Knack.  You will receive a reminder letter in the mail two months in advance. If you don't receive a letter, please call our office to schedule the follow-up appointment.

## 2014-02-13 NOTE — Progress Notes (Signed)
HPI:  80 year old woman presenting for followup evaluation. The patient has extensive coronary artery disease and underwent redo CABG in 2002. Her last nuclear stress test in 2011 demonstrated mixed ischemia and infarction in the anterolateral and inferolateral walls. This was unchanged from her 2006 study and ongoing medical therapy was recommended. She underwent permanent pacemaker placement last year for symptomatic bradycardia. Her last carotid duplex scan one month ago showed stable mild bilateral carotid stenosis. Lipids were last checked in 2013 her cholesterol was very high at 332, LDL is 234, HDL 32, and triglycerides 331.  From a cardiac perspective the patient is doing okay. She denies chest pain or shortness of breath. She feels like she slow down a little bit over the last few years. She denies palpitations or edema.  Outpatient Encounter Prescriptions as of 02/13/2014  Medication Sig  . aspirin 325 MG EC tablet Take 325 mg by mouth daily.    . benzonatate (TESSALON) 200 MG capsule Take 1 capsule (200 mg total) by mouth 2 (two) times daily as needed for cough.  . CRESTOR 5 MG tablet take 1 tablet by mouth once daily  . DULoxetine (CYMBALTA) 60 MG capsule Take 60 mg by mouth daily.  Marland Kitchen glimepiride (AMARYL) 2 MG tablet Take 2 mg by mouth daily before breakfast.   . guanFACINE (TENEX) 1 MG tablet take 1 and 1/2 tablets by mouth once daily at bedtime  . levothyroxine (SYNTHROID, LEVOTHROID) 88 MCG tablet Take 88 mcg by mouth daily.  Marland Kitchen loteprednol (ALREX) 0.2 % SUSP Place 1 drop into both eyes daily as needed. For itchy eyes  . nitroGLYCERIN (NITROSTAT) 0.4 MG SL tablet Place 0.4 mg under the tongue every 5 (five) minutes as needed. For chest pain  . traZODone (DESYREL) 150 MG tablet Take 1 and 1/2 tablets by mouth at bedtime.  . metFORMIN (GLUCOPHAGE) 500 MG tablet Take 500 mg by mouth daily with breakfast.     Allergies  Allergen Reactions  . Codeine Rash  . Morphine Hives  .  Penicillins Itching and Rash  . Prednisone Other (See Comments)    MEMORY LOSS   . Sulfonamide Derivatives Itching and Rash  . Hydrocodone-Acetaminophen   . Metoclopramide Hcl   . Tramadol Hcl   . Meperidine Hcl Itching  . Metformin Other (See Comments)    nervous  . Metoprolol Succinate Other (See Comments)    nervous  . Moxifloxacin Itching  . Oxycodone-Aspirin Rash  . Pentazocine Lactate Nausea Only  . Pioglitazone Other (See Comments)    bloating    Past Medical History  Diagnosis Date  . Hypothyroidism   . Hyperlipidemia   . GERD (gastroesophageal reflux disease)   . Anxiety   . Diabetes mellitus   . Hypertension   . PVD (peripheral vascular disease)   . Carotid bruit   . Other dysphagia   . Other esophagitis   . Hemorrhoid   . Other constipation   . Colon, diverticulosis   . Hiatal hernia   . Osteoarthrosis, unspecified whether generalized or localized, unspecified site   . Degenerative joint disease   . Esophageal stricture   . Unspecified hearing loss   . Diastolic dysfunction   . CAD (coronary artery disease)   . Shingles     ROS: Negative except as per HPI  BP 138/62  Pulse 84  Ht 5\' 1"  (1.549 m)  Wt 129 lb (58.514 kg)  BMI 24.39 kg/m2  PHYSICAL EXAM: Pt is alert and oriented, pleasant elderly  woman in NAD HEENT: normal Neck: JVP - normal, carotids 2+= without bruits Lungs: CTA bilaterally CV: RRR without murmur or gallop Abd: soft, NT, Positive BS, no hepatomegaly Ext: no C/C/E, distal pulses intact and equal Skin: warm/dry no rash  EKG:  Electronic atrial pacemaker 84 beats per minute, rightward axis, age indeterminate inferior infarct, nonspecific ST and T wave abnormality.  ASSESSMENT AND PLAN: 1. Coronary artery disease, native vessel. The patient is stable without symptoms of angina. She will continue on aspirin and Crestor.  2. Hyperlipidemia. Lipids were markedly elevated back in July 2013. She has had some problems with statin  intolerance. She's currently taking low-dose Crestor. Will repeat her lipid panel.  3. Symptomatic bradycardia status post permanent pacemaker. The patient is atrial paced on her ECG. She has upcoming followup with Dr. Rayann Heman.  Sherren Mocha 02/13/2014 2:52 PM

## 2014-02-14 ENCOUNTER — Encounter: Payer: Self-pay | Admitting: Neurology

## 2014-02-14 ENCOUNTER — Ambulatory Visit (INDEPENDENT_AMBULATORY_CARE_PROVIDER_SITE_OTHER): Payer: Medicare Other | Admitting: Neurology

## 2014-02-14 VITALS — BP 124/66 | HR 88 | Resp 16 | Ht 61.0 in | Wt 136.2 lb

## 2014-02-14 DIAGNOSIS — G4700 Insomnia, unspecified: Secondary | ICD-10-CM | POA: Diagnosis not present

## 2014-02-14 DIAGNOSIS — I25810 Atherosclerosis of coronary artery bypass graft(s) without angina pectoris: Secondary | ICD-10-CM

## 2014-02-14 DIAGNOSIS — F952 Tourette's disorder: Secondary | ICD-10-CM | POA: Diagnosis not present

## 2014-02-14 DIAGNOSIS — R413 Other amnesia: Secondary | ICD-10-CM | POA: Diagnosis not present
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
## 2014-02-20 ENCOUNTER — Ambulatory Visit (INDEPENDENT_AMBULATORY_CARE_PROVIDER_SITE_OTHER): Payer: Medicare Other | Admitting: Internal Medicine

## 2014-02-20 VITALS — BP 112/70 | HR 70 | Temp 97.4°F | Resp 14

## 2014-02-20 DIAGNOSIS — G479 Sleep disorder, unspecified: Secondary | ICD-10-CM

## 2014-02-20 DIAGNOSIS — I25810 Atherosclerosis of coronary artery bypass graft(s) without angina pectoris: Secondary | ICD-10-CM | POA: Diagnosis not present

## 2014-02-20 NOTE — Progress Notes (Signed)
   Subjective:    Patient ID: Janet Steele, female    DOB: 02-Nov-1929, 80 y.o.   MRN: [id number]  HPI She is here to follow up on Trazodone dosage; denies any acute issues today.  Her husband is in the room and reports she sleeps most of the day, often until 4 or 5pm unless she has an appointment.  He reports she reads and watches TV throughout the night.   Review of Systems Denies fever, chills, nausea, vomiting.     Objective:   Physical Exam        Assessment & Plan:

## 2014-02-20 NOTE — Patient Instructions (Signed)
I recommend a Sleep Disorder consultation  With Dr Beacher May to determine optimal therapy; please inform me if you have a physician preference.

## 2014-02-20 NOTE — Progress Notes (Signed)
   Subjective:    Patient ID: Janet Steele, female    DOB: [birth year], 80 y.o.   MRN: [id number]  HPI She is here to follow up on Trazodone dosage; denies any acute issues today.  Her husband is in the room and reports she sleeps most of the day, often until 4 or 5pm unless she has an appointment.  He reports she reads and watches TV throughout the night.   Review of Systems Denies fever, chills, nausea, vomiting.     Objective:   Physical Exam General appearance :good nourishment w/o distress.  Eyes: No conjunctival inflammation or scleral icterus is present.  Oral exam: Dental hygiene is good; lips are dry and gums are healthy appearing.There is no oropharyngeal erythema or exudate noted.   Heart:  Normal rate and regular rhythm. S1 and S2 normal without gallop, murmur, click, rub or other extra sounds     Lungs:Chest clear to auscultation; no wheezes, rhonchi,rales ,or rubs present.No increased work of breathing.   Abdomen: bowel sounds normal, soft and non-tender without masses, organomegaly or hernias noted.  No guarding or rebound .  Musculoskeletal: Able to lie flat and sit up without help.   Skin:Warm & dry.  Intact without suspicious lesions or rashes ; no jaundice or tenting  Lymphatic: No lymphadenopathy is noted about the head, neck, axilla     Assessment & Plan:  #1 she has a sleep disorder which has not been clarified. Her low oxygen saturations on room air and the lack of observed sleep patterns worries me for possible underlying sleep apnea. Certainly she does have some other variant sleep disorder.  I expressed my discomfort with large doses of the trazodone as has Dr. Carles Collet.  I recommend consultation with Dr. Beacher May to define the cause of the sleep disorder and it buys [place] as to the most efficacious and safest therapy.

## 2014-02-20 NOTE — Assessment & Plan Note (Signed)
Consultation with Dr Beacher May

## 2014-03-02 ENCOUNTER — Inpatient Hospital Stay (HOSPITAL_COMMUNITY)
Admission: EM | Admit: 2014-03-02 | Discharge: 2014-03-06 | DRG: 281 | Disposition: A | Discharge status: Home / Self Care | Payer: Medicare Other | Attending: Cardiovascular Disease | Admitting: Cardiovascular Disease

## 2014-03-02 ENCOUNTER — Emergency Department (HOSPITAL_COMMUNITY): Payer: Medicare Other

## 2014-03-02 ENCOUNTER — Encounter (HOSPITAL_COMMUNITY): Payer: Self-pay | Admitting: Emergency Medicine

## 2014-03-02 DIAGNOSIS — Z66 Do not resuscitate: Secondary | ICD-10-CM | POA: Diagnosis present

## 2014-03-02 DIAGNOSIS — R0789 Other chest pain: Secondary | ICD-10-CM | POA: Diagnosis not present

## 2014-03-02 DIAGNOSIS — Z87891 Personal history of nicotine dependence: Secondary | ICD-10-CM | POA: Diagnosis not present

## 2014-03-02 DIAGNOSIS — R9431 Abnormal electrocardiogram [ECG] [EKG]: Secondary | ICD-10-CM

## 2014-03-02 DIAGNOSIS — Z7982 Long term (current) use of aspirin: Secondary | ICD-10-CM

## 2014-03-02 DIAGNOSIS — Z885 Allergy status to narcotic agent status: Secondary | ICD-10-CM

## 2014-03-02 DIAGNOSIS — I25810 Atherosclerosis of coronary artery bypass graft(s) without angina pectoris: Secondary | ICD-10-CM | POA: Diagnosis present

## 2014-03-02 DIAGNOSIS — I739 Peripheral vascular disease, unspecified: Secondary | ICD-10-CM | POA: Diagnosis present

## 2014-03-02 DIAGNOSIS — H9190 Unspecified hearing loss, unspecified ear: Secondary | ICD-10-CM | POA: Diagnosis present

## 2014-03-02 DIAGNOSIS — I2582 Chronic total occlusion of coronary artery: Secondary | ICD-10-CM | POA: Diagnosis present

## 2014-03-02 DIAGNOSIS — E119 Type 2 diabetes mellitus without complications: Secondary | ICD-10-CM | POA: Diagnosis present

## 2014-03-02 DIAGNOSIS — I10 Essential (primary) hypertension: Secondary | ICD-10-CM | POA: Diagnosis present

## 2014-03-02 DIAGNOSIS — I2510 Atherosclerotic heart disease of native coronary artery without angina pectoris: Secondary | ICD-10-CM | POA: Diagnosis present

## 2014-03-02 DIAGNOSIS — I252 Old myocardial infarction: Secondary | ICD-10-CM

## 2014-03-02 DIAGNOSIS — Z888 Allergy status to other drugs, medicaments and biological substances status: Secondary | ICD-10-CM

## 2014-03-02 DIAGNOSIS — F411 Generalized anxiety disorder: Secondary | ICD-10-CM | POA: Diagnosis present

## 2014-03-02 DIAGNOSIS — Z79899 Other long term (current) drug therapy: Secondary | ICD-10-CM

## 2014-03-02 DIAGNOSIS — R079 Chest pain, unspecified: Secondary | ICD-10-CM

## 2014-03-02 DIAGNOSIS — F0280 Dementia in other diseases classified elsewhere without behavioral disturbance: Secondary | ICD-10-CM | POA: Diagnosis present

## 2014-03-02 DIAGNOSIS — I214 Non-ST elevation (NSTEMI) myocardial infarction: Secondary | ICD-10-CM | POA: Diagnosis not present

## 2014-03-02 DIAGNOSIS — Y832 Surgical operation with anastomosis, bypass or graft as the cause of abnormal reaction of the patient, or of later complication, without mention of misadventure at the time of the procedure: Secondary | ICD-10-CM | POA: Diagnosis present

## 2014-03-02 DIAGNOSIS — R413 Other amnesia: Secondary | ICD-10-CM | POA: Diagnosis not present

## 2014-03-02 DIAGNOSIS — M1990 Unspecified osteoarthritis, unspecified site: Secondary | ICD-10-CM | POA: Diagnosis present

## 2014-03-02 DIAGNOSIS — Z880 Allergy status to penicillin: Secondary | ICD-10-CM | POA: Diagnosis not present

## 2014-03-02 DIAGNOSIS — K222 Esophageal obstruction: Secondary | ICD-10-CM | POA: Diagnosis present

## 2014-03-02 DIAGNOSIS — F952 Tourette's disorder: Secondary | ICD-10-CM | POA: Diagnosis present

## 2014-03-02 DIAGNOSIS — I517 Cardiomegaly: Secondary | ICD-10-CM | POA: Diagnosis not present

## 2014-03-02 DIAGNOSIS — E039 Hypothyroidism, unspecified: Secondary | ICD-10-CM | POA: Diagnosis present

## 2014-03-02 DIAGNOSIS — T82897A Other specified complication of cardiac prosthetic devices, implants and grafts, initial encounter: Secondary | ICD-10-CM | POA: Diagnosis present

## 2014-03-02 DIAGNOSIS — I498 Other specified cardiac arrhythmias: Secondary | ICD-10-CM | POA: Diagnosis present

## 2014-03-02 DIAGNOSIS — K219 Gastro-esophageal reflux disease without esophagitis: Secondary | ICD-10-CM | POA: Diagnosis not present

## 2014-03-02 DIAGNOSIS — Z950 Presence of cardiac pacemaker: Secondary | ICD-10-CM

## 2014-03-02 DIAGNOSIS — E785 Hyperlipidemia, unspecified: Secondary | ICD-10-CM | POA: Diagnosis present

## 2014-03-02 DIAGNOSIS — I200 Unstable angina: Secondary | ICD-10-CM | POA: Diagnosis present

## 2014-03-02 DIAGNOSIS — Z8249 Family history of ischemic heart disease and other diseases of the circulatory system: Secondary | ICD-10-CM

## 2014-03-02 LAB — CBC
HEMATOCRIT: 46 % (ref 36.0–46.0)
HEMOGLOBIN: 15.8 g/dL — AB (ref 12.0–15.0)
MCH: 29.6 pg (ref 26.0–34.0)
MCHC: 34.3 g/dL (ref 30.0–36.0)
MCV: 86.1 fL (ref 78.0–100.0)
Platelets: 263 10*3/uL (ref 150–400)
RBC: 5.34 MIL/uL — ABNORMAL HIGH (ref 3.87–5.11)
RDW: 13.5 % (ref 11.5–15.5)
WBC: 7.3 10*3/uL (ref 4.0–10.5)

## 2014-03-02 LAB — I-STAT TROPONIN, ED: Troponin i, poc: 0.01 ng/mL (ref 0.00–0.08)

## 2014-03-02 LAB — BASIC METABOLIC PANEL
BUN: 15 mg/dL (ref 6–23)
CHLORIDE: 98 meq/L (ref 96–112)
CO2: 25 meq/L (ref 19–32)
Calcium: 9.8 mg/dL (ref 8.4–10.5)
Creatinine, Ser: 0.9 mg/dL (ref 0.50–1.10)
GFR calc Af Amer: 67 mL/min — ABNORMAL LOW (ref >90)
GFR calc non Af Amer: 58 mL/min — ABNORMAL LOW (ref >90)
GLUCOSE: 178 mg/dL — AB (ref 70–99)
POTASSIUM: 4.4 meq/L (ref 3.7–5.3)
Sodium: 138 mEq/L (ref 137–147)

## 2014-03-02 LAB — GLUCOSE, CAPILLARY: GLUCOSE-CAPILLARY: 88 mg/dL (ref 70–99)

## 2014-03-02 LAB — TROPONIN I: TROPONIN I: 0.46 ng/mL — AB (ref <0.30)

## 2014-03-02 MED ORDER — SODIUM CHLORIDE 0.9 % IV BOLUS (SEPSIS)
1000.0000 mL | Freq: Once | INTRAVENOUS | Status: AC
  Administered 2014-03-02: 1000 mL via INTRAVENOUS

## 2014-03-02 MED ORDER — ASPIRIN EC 81 MG PO TBEC: 81.0000 mg | DELAYED_RELEASE_TABLET | Freq: Every day | ORAL | Status: DC

## 2014-03-02 MED ORDER — NITROGLYCERIN 0.4 MG SL SUBL: 0.4000 mg | SUBLINGUAL_TABLET | SUBLINGUAL | Status: DC | PRN

## 2014-03-02 MED ORDER — ASPIRIN 81 MG PO CHEW: 324.0000 mg | CHEWABLE_TABLET | ORAL | Status: AC

## 2014-03-02 MED ORDER — NITROGLYCERIN 0.4 MG SL SUBL
0.4000 mg | SUBLINGUAL_TABLET | SUBLINGUAL | Status: DC | PRN
  Administered 2014-03-02 – 2014-03-06 (×15): 0.4 mg via SUBLINGUAL
  Filled 2014-03-02 (×2): qty 1

## 2014-03-02 MED ORDER — METOPROLOL TARTRATE 12.5 MG HALF TABLET
12.5000 mg | ORAL_TABLET | Freq: Two times a day (BID) | ORAL | Status: DC
  Administered 2014-03-02 – 2014-03-03 (×2): 12.5 mg via ORAL
  Filled 2014-03-02 (×3): qty 1

## 2014-03-02 MED ORDER — ASPIRIN EC 325 MG PO TBEC
325.0000 mg | DELAYED_RELEASE_TABLET | Freq: Every day | ORAL | Status: DC
  Administered 2014-03-03 – 2014-03-06 (×4): 325 mg via ORAL
  Filled 2014-03-02 (×4): qty 1

## 2014-03-02 MED ORDER — LOTEPREDNOL ETABONATE 0.5 % OP SUSP
1.0000 [drp] | Freq: Every day | OPHTHALMIC | Status: DC | PRN
  Filled 2014-03-02: qty 5

## 2014-03-02 MED ORDER — NITROGLYCERIN IN D5W 200-5 MCG/ML-% IV SOLN
2.0000 ug/min | Freq: Once | INTRAVENOUS | Status: AC
  Administered 2014-03-02: 5 ug/min via INTRAVENOUS
  Filled 2014-03-02: qty 250

## 2014-03-02 MED ORDER — HEPARIN BOLUS VIA INFUSION
3000.0000 [IU] | Freq: Once | INTRAVENOUS | Status: AC
  Administered 2014-03-02: 3000 [IU] via INTRAVENOUS
  Filled 2014-03-02: qty 3000

## 2014-03-02 MED ORDER — ALPRAZOLAM 0.25 MG PO TABS
0.2500 mg | ORAL_TABLET | Freq: Once | ORAL | Status: AC
  Administered 2014-03-02: 0.25 mg via ORAL

## 2014-03-02 MED ORDER — TRAZODONE HCL 100 MG PO TABS
225.0000 mg | ORAL_TABLET | Freq: Every day | ORAL | Status: DC
  Administered 2014-03-02 – 2014-03-04 (×3): 225 mg via ORAL
  Filled 2014-03-02 (×5): qty 1

## 2014-03-02 MED ORDER — INSULIN ASPART 100 UNIT/ML ~~LOC~~ SOLN
0.0000 [IU] | Freq: Three times a day (TID) | SUBCUTANEOUS | Status: DC
Start: 2014-03-03 — End: 2014-03-06
  Administered 2014-03-03 – 2014-03-05 (×4): 2 [IU] via SUBCUTANEOUS
  Administered 2014-03-05: 1 [IU] via SUBCUTANEOUS

## 2014-03-02 MED ORDER — GUANFACINE HCL ER 1 MG PO TB24: 1.5000 mg | ORAL_TABLET | Freq: Every day | ORAL | Status: DC

## 2014-03-02 MED ORDER — LEVOTHYROXINE SODIUM 88 MCG PO TABS
88.0000 ug | ORAL_TABLET | Freq: Every day | ORAL | Status: DC
  Administered 2014-03-03 – 2014-03-06 (×4): 88 ug via ORAL
  Filled 2014-03-02 (×5): qty 1

## 2014-03-02 MED ORDER — NITROGLYCERIN IN D5W 200-5 MCG/ML-% IV SOLN
2.0000 ug/min | INTRAVENOUS | Status: DC
  Administered 2014-03-02: 10 ug/min via INTRAVENOUS
  Administered 2014-03-02: 20 ug/min via INTRAVENOUS
  Administered 2014-03-02: 30 ug/min via INTRAVENOUS
  Administered 2014-03-02: 15 ug/min via INTRAVENOUS

## 2014-03-02 MED ORDER — HEPARIN (PORCINE) IN NACL 100-0.45 UNIT/ML-% IJ SOLN
1000.0000 [IU]/h | INTRAMUSCULAR | Status: AC
  Administered 2014-03-02 – 2014-03-03 (×2): 950 [IU]/h via INTRAVENOUS
  Filled 2014-03-02 (×3): qty 250

## 2014-03-02 MED ORDER — FENTANYL CITRATE 0.05 MG/ML IJ SOLN: 25.0000 ug | Freq: Once | INTRAMUSCULAR | Status: DC

## 2014-03-02 MED ORDER — ATORVASTATIN CALCIUM 80 MG PO TABS
80.0000 mg | ORAL_TABLET | Freq: Every day | ORAL | Status: DC
  Administered 2014-03-03 – 2014-03-04 (×2): 80 mg via ORAL
  Filled 2014-03-02 (×4): qty 1

## 2014-03-02 MED ORDER — ALPRAZOLAM 0.25 MG PO TABS
0.2500 mg | ORAL_TABLET | Freq: Every evening | ORAL | Status: DC | PRN
  Administered 2014-03-03 – 2014-03-05 (×3): 0.25 mg via ORAL
  Filled 2014-03-02 (×4): qty 1

## 2014-03-02 MED ORDER — GUANFACINE HCL 1 MG PO TABS
1.5000 mg | ORAL_TABLET | Freq: Every day | ORAL | Status: DC
  Administered 2014-03-02 – 2014-03-04 (×2): 1.5 mg via ORAL
  Filled 2014-03-02 (×5): qty 2

## 2014-03-02 MED ORDER — BENZONATATE 100 MG PO CAPS
200.0000 mg | ORAL_CAPSULE | ORAL | Status: DC | PRN
  Administered 2014-03-04 – 2014-03-06 (×3): 200 mg via ORAL
  Filled 2014-03-02 (×3): qty 2

## 2014-03-02 MED ORDER — DULOXETINE HCL 60 MG PO CPEP
60.0000 mg | ORAL_CAPSULE | Freq: Every day | ORAL | Status: DC
  Administered 2014-03-03 – 2014-03-06 (×4): 60 mg via ORAL
  Filled 2014-03-02 (×4): qty 1

## 2014-03-02 MED ORDER — ASPIRIN 300 MG RE SUPP
300.0000 mg | RECTAL | Status: AC
  Filled 2014-03-02: qty 1

## 2014-03-02 MED ORDER — FENTANYL CITRATE 0.05 MG/ML IJ SOLN
50.0000 ug | Freq: Once | INTRAMUSCULAR | Status: AC
  Administered 2014-03-02: 50 ug via INTRAVENOUS
  Filled 2014-03-02: qty 2

## 2014-03-02 NOTE — ED Notes (Signed)
Dinner tray ordered for pt

## 2014-03-02 NOTE — ED Notes (Signed)
Pt sating 91-92% on RA.  Pt sts she does not wear oxygen at home.  Pt placed on 2L [place].

## 2014-03-02 NOTE — ED Notes (Signed)
Report called to [facility], RN 2H.

## 2014-03-02 NOTE — ED Notes (Signed)
Pt with another episode of chest pain.  Pt sts "I'm hurting".  Pt given 1 nitro.  Pt still reporting pain.  Cardiologist paged.  Awaiting phone call. Will continue to monitor.

## 2014-03-02 NOTE — ED Notes (Signed)
Pt reporting chest pain again 5/10.  EDPA made aware.  Second sublingual nitro given.  BP 145/62

## 2014-03-02 NOTE — H&P (Addendum)
Patient ID: Janet Steele MRN: [id number], DOB/AGE: 78-Feb-1930   Admit date: 03/02/2014   Primary Physician: Unice Cobble, MD Primary Cardiologist: Sherren Mocha, MD  Pt. Profile: 89F with CAD s/p CABG (4v CABG in 1982 s/p re-do 3v CABG in 2003, last nuclear stress test in 2011 demonstrated mixed ischemia and infarction in the anterolateral and inferolateral walls, stable from 2006), s/p dual chamber PPM (07/06/12) for symptomatic bradycardia, DM, HTN, HLD, PVD, Tourettes, alzheimer's dementia who presents with typical chest pain.   Problem List  Past Medical History  Diagnosis Date  . Hypothyroidism   . Hyperlipidemia   . GERD (gastroesophageal reflux disease)   . Anxiety   . Diabetes mellitus   . Hypertension   . PVD (peripheral vascular disease)   . Carotid bruit   . Other dysphagia   . Other esophagitis   . Hemorrhoid   . Other constipation   . Colon, diverticulosis   . Hiatal hernia   . Osteoarthrosis, unspecified whether generalized or localized, unspecified site   . Degenerative joint disease   . Esophageal stricture   . Unspecified hearing loss   . Diastolic dysfunction   . CAD (coronary artery disease)   . Shingles     Past Surgical History  Procedure Laterality Date  . Appendectomy    . Cholecystectomy    . Abdominal hysterectomy    . Oophorectomy      bilateral  . Coronary artery bypass graft      x4(1982),02-2002 CABG X2  . Back surgery  01-2000  . Pacemaker insertion  07/06/12    MDT Adapta L implanted by Dr Rayann Heman for symptomatic bradycardia     Allergies  Allergies  Allergen Reactions  . Prednisone Other (See Comments)    MEMORY LOSS   . Codeine Rash  . Morphine Hives, Itching and Rash  . Penicillins Itching and Rash  . Sulfonamide Derivatives Itching and Rash  . Hydrocodone-Acetaminophen Other (See Comments)    unknown  . Meperidine Hcl Itching  . Metformin Other (See Comments)    nervous  . Metoclopramide Hcl Itching and Rash  .  Metoprolol Succinate Other (See Comments)    nervous  . Moxifloxacin Itching  . Oxycodone-Aspirin Rash  . Pentazocine Lactate Nausea Only  . Pioglitazone Other (See Comments)    bloating  . Tramadol Hcl Other (See Comments)    unknown    HPI  89F with CAD s/p CABG (4v CABG in 1982 s/p re-do 3v CABG in 2003, last nuclear stress test in 2011 demonstrated mixed ischemia and infarction in the anterolateral and inferolateral walls, stable from 2006), s/p dual chamber PPM (07/06/12) for symptomatic bradycardia, DM, HTN, HLD, PVD, Tourettes, alzheimer's dementia who presents with chest pain.   She states that last night while getting ready for bed she developed chest pain "all over" her chest with radiation to her left elbow. Stated it felt like a "truck sitting on my chest." No associated symptoms. Not modified by inspiration, palpation, or movement. Max severity 3/10 at that time. Pain subsided after 30-45 minutes without intervention. States it felt like MI pain for 2003.   This morning while reading the paper she developed the same chest pain but this time was more severe - 9/10. It lasted all day long and at 3pm her husband brought her in to the ED for evaluation.   In ED: P 77, BP 129/99. ECG was unchanged. She was given 2 SL NTG with resolution of symptoms. Initial Tn (-).  Cardiology consulted for admission. At the end of my interview she reported a recurrence of the chest pain. She was given a SL NTG and the pain resolved.   Of note, she has been out of her home SL NTG for months. States she never takes it so did not refill the rx.   Home Medications  Prior to Admission medications   Medication Sig Start Date End Date Taking? Authorizing Provider  aspirin 325 MG EC tablet Take 325 mg by mouth daily.     Yes Historical Provider, MD  benzonatate (TESSALON) 200 MG capsule Take 1 capsule (200 mg total) by mouth 2 (two) times daily as needed for cough. 12/19/13  Yes Hendricks Limes, MD    DULoxetine (CYMBALTA) 60 MG capsule Take 60 mg by mouth daily. 09/20/13  Yes Hendricks Limes, MD  glimepiride (AMARYL) 2 MG tablet Take 2 mg by mouth daily before breakfast.  04/02/11  Yes Hendricks Limes, MD  guanFACINE (INTUNIV) 1 MG TB24 Take 1.5 mg by mouth daily.   Yes Historical Provider, MD  levothyroxine (SYNTHROID, LEVOTHROID) 88 MCG tablet Take 88 mcg by mouth daily.   Yes Historical Provider, MD  loteprednol (ALREX) 0.2 % SUSP Place 1 drop into both eyes daily as needed. For itchy eyes   Yes Historical Provider, MD  metFORMIN (GLUCOPHAGE) 500 MG tablet Take 500 mg by mouth daily with breakfast.  04/07/11  Yes Hendricks Limes, MD  nitroGLYCERIN (NITROSTAT) 0.4 MG SL tablet Place 0.4 mg under the tongue every 5 (five) minutes as needed. For chest pain   Yes Historical Provider, MD  rosuvastatin (CRESTOR) 5 MG tablet Take 5 mg by mouth daily.   Yes Historical Provider, MD  traZODone (DESYREL) 150 MG tablet Take 225 mg by mouth at bedtime.   Yes Historical Provider, MD    Family History  Family History  Problem Relation Age of Onset  . Heart attack Father   . Heart disease Sister   . Colon cancer Neg Hx     Social History  History   Social History  . Marital Status: Married    Spouse Name: N/A    Number of Children: N/A  . Years of Education: N/A   Occupational History  . Retired     Social History Main Topics  . Smoking status: Former Smoker    Types: Cigarettes    Quit date: 07/05/1976  . Smokeless tobacco: Never Used     Comment: smoked age18- 1977, up to 1/2 ppd  . Alcohol Use: 2.4 oz/week    4 Glasses of wine per week  . Drug Use: No  . Sexual Activity: No   Other Topics Concern  . Not on file   Social History Narrative  . No narrative on file     Review of Systems General:  No chills, fever, night sweats or weight changes.  Cardiovascular:   dyspnea on exertion, edema, orthopnea, palpitations, paroxysmal nocturnal dyspnea. Dermatological: No rash,  lesions/masses Respiratory: No cough, dyspnea Urologic: No hematuria, dysuria Abdominal:   No nausea, vomiting, diarrhea, bright red blood per rectum, melena, or hematemesis Neurologic:  No visual changes, wkns, changes in mental status. All other systems reviewed and are otherwise negative except as noted above.  Physical Exam  Blood pressure 103/50, pulse 62, temperature 97.4 F (36.3 C), temperature source Oral, resp. rate 16, SpO2 96.00%.  General: Pleasant, NAD Psych: Normal affect. Neuro: Alert. Moves all extremities spontaneously. HEENT: Normal  Neck: Supple without bruits  or JVD. Lungs:  Resp regular and unlabored, CTA. Heart: RRR no s3, s4, or murmurs. Abdomen: Soft, non-tender, non-distended, BS + x 4.  Extremities: No clubbing, cyanosis or edema. DP/PT/Radials 2+ and equal bilaterally.  Labs  Troponin [facility] Test)  Recent Labs  03/02/14 1547  TROPIPOC 0.01   No results found for this basename: CKTOTAL, CKMB, TROPONINI,  in the last 72 hours Lab Results  Component Value Date   WBC 7.3 03/02/2014   HGB 15.8* 03/02/2014   HCT 46.0 03/02/2014   MCV 86.1 03/02/2014   PLT 263 03/02/2014    Recent Labs Lab 03/02/14 1540  NA 138  K 4.4  CL 98  CO2 25  BUN 15  CREATININE 0.90  CALCIUM 9.8  GLUCOSE 178*   Lab Results  Component Value Date   CHOL 214* 02/13/2014   HDL 36.60* 02/13/2014   LDLCALC 138* 02/13/2014   TRIG 198.0* 02/13/2014   No results found for this basename: DDIMER     Radiology/Studies  No results found.  TTE 01/05/12 - Left ventricle: The cavity size was normal. Wall thickness was normal. Systolic function was normal. The estimated ejection fraction was in the range of 55% to 60%. Wall motion was normal; there were no regional wall motion abnormalities. Doppler parameters are consistent with abnormal left ventricular relaxation (grade 1 diastolic dysfunction). - Aortic valve: There was no stenosis. - Mitral valve: Mildly  calcified annulus. Mild regurgitation. - Left atrium: The atrium was moderately dilated. - Right ventricle: The cavity size was normal. Systolic function was normal. - Pulmonary arteries: PA systolic pressure [id number]. - Systemic veins: IVC measured 1.9 cm with normal respirophasic variation, suggesting RA pressure 6-10 mmHg. Impressions: - Normal LV size and systolic function, EF 0000000. Normal RV size and systolic function. Moderate left atrial enlargement. Mild mitral regurgitation.   ECG A-paced. Inferior and lateral STD and TWI. Similar to prior on 02/13/14.    ASSESSMENT AND PLAN 57F with CAD s/p CABG (4v CABG in 1982 s/p re-do 3v CABG in 2003, last nuclear stress test in 2011 demonstrated mixed ischemia and infarction in the anterolateral and inferolateral walls, stable from 2006), s/p dual chamber PPM (07/06/12) for symptomatic bradycardia, DM, HTN, HLD, PVD, Tourettes, alzheimer's dementia who presents with typical chest pain.    The angina is typical and consistent with her prior MIs. The pain has recurred once here already. She is reluctant to undergo a cardiac cath because of her tics. She states that the more she knows she must stay still, the less she is able to stay still. She may require anesthesia if a cardiac cath is required.   Of note, she does not recall feeling "nervous" after taking metoprolol in the past and is willing to try it again.   1. UA. Based on biomarkers and future recurrences will need to determine cath vs. stress. Given her complex coronary situation (s/p re-do CABG) a stress might be helpful in guiding PCI even if ultimate plan is for cath.   - start metoprolol 12.5mg  BID - ASA - atorva 80mg  - start UFH - PRN SL NTG - A1c, lipids - TTE - NPO in case stress is pursued - Hold oral hypogylcemics; AC/HS FS with RISS  Confirmed DNR  Signed, Lamar Sprinkles, MD 03/02/2014, 6:53 PM   ADDENDUM Patient has now had 3rd recurrence of CP. Will plan  to start nitro gtt, admit to stepdown, and plan for cardiac cath

## 2014-03-02 NOTE — ED Notes (Signed)
Per representative from [employer], pt's pacemaker is functioning with no atrial or ventricular fast paced rhythms noted.  Sts pt's heart rate has been maintained in the 60's.  Battery life is good.

## 2014-03-02 NOTE — ED Notes (Signed)
Pt states shes been having chest pain today radiating down her L arm today, states "it feels like when i had my 2 heart attacks." she reports nausea but denies SOB.

## 2014-03-02 NOTE — ED Notes (Signed)
Report called to Hilda Blades, RN 3W.

## 2014-03-02 NOTE — ED Provider Notes (Signed)
CSN: [id number]     Arrival date & time 03/02/14  1532 History   First MD Initiated Contact with Patient 03/02/14 1554     Chief Complaint  Patient presents with  . Chest Pain     (Consider location/radiation/quality/duration/timing/severity/associated sxs/prior Treatment) The history is provided by the patient. No language interpreter was used.  Janet Steele is an 80 y/o F with PMHx of CABG x 4 in 1982 and 3 in 2003, PM placed in 07/2012 MDT Adapta L implanted, hypothyroidism, GERD, anxuety, PVD, Carotid bruits, CAD presenting to the ED with chest pain that started last night abruptly described as a "truck sitting on my chest" with associated shortness of breath and dizziness with mild nausea. Patient reported that the chest pain got progressively worse this morning. Reported increase in pressure. Stated that the pain radiated down the left arm with a mild dull aching sensation. Reported that she has had intermittent dizziness with the pain as well as nausea. Reported that she has an appointment with Dr. Rayann Heman Monday. Reported that she took ASA, but ran out of her nitro for a couple of months that she would use as needed for chest pain. Denied weakness, blood thinners, numbness, tingling, headache, blurred vision, sudden loss of vision, vomiting, fainting.  PCP Dr. Linna Darner Cardio Dr. Burt Knack  Past Medical History  Diagnosis Date  . Hypothyroidism   . Hyperlipidemia   . GERD (gastroesophageal reflux disease)   . Anxiety   . Diabetes mellitus   . Hypertension   . PVD (peripheral vascular disease)   . Carotid bruit   . Other dysphagia   . Other esophagitis   . Hemorrhoid   . Other constipation   . Colon, diverticulosis   . Hiatal hernia   . Osteoarthrosis, unspecified whether generalized or localized, unspecified site   . Degenerative joint disease   . Esophageal stricture   . Unspecified hearing loss   . Diastolic dysfunction   . CAD (coronary artery disease)   . Shingles     Past Surgical History  Procedure Laterality Date  . Appendectomy    . Cholecystectomy    . Abdominal hysterectomy    . Oophorectomy      bilateral  . Coronary artery bypass graft      x4(1982),02-2002 CABG X2  . Back surgery  01-2000  . Pacemaker insertion  07/06/12    MDT Adapta L implanted by Dr Rayann Heman for symptomatic bradycardia   Family History  Problem Relation Age of Onset  . Heart attack Father   . Heart disease Sister   . Colon cancer Neg Hx    History  Substance Use Topics  . Smoking status: Former Smoker    Types: Cigarettes    Quit date: 07/05/1976  . Smokeless tobacco: Never Used     Comment: smoked age18- 1977, up to 1/2 ppd  . Alcohol Use: 2.4 oz/week    4 Glasses of wine per week   OB History   Grav Para Term Preterm Abortions TAB SAB Ect Mult Living                 Review of Systems  Constitutional: Negative for fever and chills.  Respiratory: Positive for shortness of breath. Negative for chest tightness.   Cardiovascular: Positive for chest pain.  Gastrointestinal: Positive for nausea. Negative for vomiting, abdominal pain, diarrhea and constipation.  Musculoskeletal: Negative for back pain and neck pain.  Neurological: Positive for dizziness. Negative for weakness and numbness.  All other  systems reviewed and are negative.      Allergies  Codeine; Morphine; Penicillins; Prednisone; Sulfonamide derivatives; Hydrocodone-acetaminophen; Metoclopramide hcl; Tramadol hcl; Meperidine hcl; Metformin; Metoprolol succinate; Moxifloxacin; Oxycodone-aspirin; Pentazocine lactate; and Pioglitazone  Home Medications   Current Outpatient Rx  Name  Route  Sig  Dispense  Refill  . aspirin 325 MG EC tablet   Oral   Take 325 mg by mouth daily.           . benzonatate (TESSALON) 200 MG capsule   Oral   Take 1 capsule (200 mg total) by mouth 2 (two) times daily as needed for cough.   20 capsule   0   . CRESTOR 5 MG tablet      take 1 tablet by mouth  once daily   30 tablet   5     Pt due for labs.   . DULoxetine (CYMBALTA) 60 MG capsule   Oral   Take 60 mg by mouth daily.         Marland Kitchen glimepiride (AMARYL) 2 MG tablet   Oral   Take 2 mg by mouth daily before breakfast.          . guanFACINE (TENEX) 1 MG tablet      take 1 and 1/2 tablets by mouth once daily at bedtime   45 tablet   5   . levothyroxine (SYNTHROID, LEVOTHROID) 88 MCG tablet   Oral   Take 88 mcg by mouth daily.         Marland Kitchen loteprednol (ALREX) 0.2 % SUSP   Both Eyes   Place 1 drop into both eyes daily as needed. For itchy eyes         . metFORMIN (GLUCOPHAGE) 500 MG tablet   Oral   Take 500 mg by mouth daily with breakfast.          . nitroGLYCERIN (NITROSTAT) 0.4 MG SL tablet   Sublingual   Place 0.4 mg under the tongue every 5 (five) minutes as needed. For chest pain         . traZODone (DESYREL) 150 MG tablet      Take 1 and 1/2 tablets by mouth at bedtime.   45 tablet   0     Refilled only until seen by Dr Tat    BP 129/99  Pulse 77  Temp(Src) 97.4 F (36.3 C) (Oral)  Resp 20  SpO2 97% Physical Exam  Nursing note and vitals reviewed. Constitutional: She is oriented to person, place, and time. She appears well-developed and well-nourished. No distress.  HENT:  Head: Normocephalic and atraumatic.  Mouth/Throat: No oropharyngeal exudate.  Dry mucous membranes  Eyes: Conjunctivae and EOM are normal. Pupils are equal, round, and reactive to light. Right eye exhibits no discharge. Left eye exhibits no discharge.  Cardiovascular: Normal rate, regular rhythm and normal heart sounds.  Exam reveals no friction rub.   No murmur heard. Pulses:      Radial pulses are 2+ on the right side, and 2+ on the left side.       Dorsalis pedis pulses are 2+ on the right side, and 2+ on the left side.  Cap refill < 3 seconds Negative leg swelling or pitting edema noted bilaterally to the lower extremities bilaterally Negative Homan's sign  bilaterally  Negative lower extremity asymmetry noted  Pulmonary/Chest: Effort normal and breath sounds normal. No respiratory distress. She has no wheezes. She has no rales.  Patient is able to speak in full sentences  without difficulty  Negative use of accessory muscles Negative stridor  Musculoskeletal: Normal range of motion.  Full ROM to upper and lower extremities without difficulty noted, negative ataxia noted.  Neurological: She is alert and oriented to person, place, and time. No cranial nerve deficit. She exhibits normal muscle tone. Coordination normal.  Skin: Skin is warm and dry. No rash noted. She is not diaphoretic. No erythema.  Psychiatric: She has a normal mood and affect. Her behavior is normal. Thought content normal.    ED Course  Procedures (including critical care time)  6:43 PM This provider spoke with Dr. Ernestina Patches, [facility] - discussed case, history, and labs in great detail. Recommended cardiology to be consulted.   6:53 PM This provider spoke with Dr. Tommi Rumps, Cardiology - discussed case, history, presentation, labs. Cardiology to see patient.   7:19 PM Cardiology in room assessing patient.   7:32 PM This provider spoke with Dr. Tommi Rumps from Cardiology - reported that patient will be admitted to the hospital under Cardiology.   Results for orders placed during the hospital encounter of 03/02/14  CBC      Result Value Ref Range   WBC 7.3  4.0 - 10.5 K/uL   RBC 5.34 (*) 3.87 - 5.11 MIL/uL   Hemoglobin 15.8 (*) 12.0 - 15.0 g/dL   HCT 46.0  36.0 - 46.0 %   MCV 86.1  78.0 - 100.0 fL   MCH 29.6  26.0 - 34.0 pg   MCHC 34.3  30.0 - 36.0 g/dL   RDW 13.5  11.5 - 15.5 %   Platelets 263  150 - 400 K/uL  BASIC METABOLIC PANEL      Result Value Ref Range   Sodium 138  137 - 147 mEq/L   Potassium 4.4  3.7 - 5.3 mEq/L   Chloride 98  96 - 112 mEq/L   CO2 25  19 - 32 mEq/L   Glucose, Bld 178 (*) 70 - 99 mg/dL   BUN 15  6 - 23 mg/dL   Creatinine, Ser 0.90  0.50 - 1.10  mg/dL   Calcium 9.8  8.4 - 10.5 mg/dL   GFR calc non Af Amer 58 (*) >90 mL/min   GFR calc Af Amer 67 (*) >90 mL/min  I-STAT TROPOININ, ED      Result Value Ref Range   Troponin i, poc 0.01  0.00 - 0.08 ng/mL   Comment 3             Labs Review Labs Reviewed  CBC  BASIC METABOLIC PANEL  I-STAT TROPOININ, ED   Imaging Review No results found.   EKG Interpretation   Date/Time:  Saturday March 02 2014 15:35:32 EDT Ventricular Rate:  83 PR Interval:  132 QRS Duration: 98 QT Interval:  382 QTC Calculation: 448 R Axis:   83 Text Interpretation:  Electronic atrial pacemaker ST \\T \ T wave  abnormality, consider inferolateral ischemia Abnormal ECG No significant  change since last tracing Confirmed by BEATON  MD, ROBERT ([id number]) on  03/02/2014 3:39:15 PM      MDM   Final diagnoses:  None   Medications  nitroGLYCERIN (NITROSTAT) SL tablet 0.4 mg (0.4 mg Sublingual Given 03/02/14 1732)  fentaNYL (SUBLIMAZE) injection 50 mcg (not administered)  sodium chloride 0.9 % bolus 1,000 mL (1,000 mLs Intravenous New Bag/Given 03/02/14 1705)   Filed Vitals:   03/02/14 1745 03/02/14 1800 03/02/14 1857 03/02/14 1900  BP: 103/50 131/63 116/91 113/56  Pulse: 62 72 67 61  Temp:  TempSrc:      Resp: 16 18 18 19   SpO2: 96% 99% 100% 100%    Patient presenting to the ED with chest pain that started yesterday described as a "truck on my chest" with associated shortness of breath, dizziness with radiation to the left arm. Reported that she took ASA today. Reported that the chest pain got worse this morning. Reported intermittent dizziness with feeling nauseous this afternoon. Denied fainting, vomiting. Patient has a long history of CAD - CABG x 4 in 1982 and x 3 in 2003. Patient followed by Dr. Burt Knack. PM placed in 07/2012.  Alert and oriented. GCS 15. Heart rate and rhythm normal. Lungs clear to auscultation to upper and lower lobes bilaterally. Radial and DP pulses 2+ bilaterally. Cap refill  < 3 seconds. Negative leg swelling or pitting edema noted to the lower extremities bilaterally - negative Homan's sign. Patient is able to speak in full sentences without difficulty - negative stridor.  EKG noted electronic atrial pacemaker ST/T-wave abnormality-negative abnormal EKG noted. Negative changes noted to tracing. MDT PM interrogated with negative acute findings noted. Troponin negative elevation. CBC negative elevated WBC. Hgb noted to be elevated - hemoconcentrated. BMP negative findings noted.  Patient has at least 2 SL nitro while in the ED setting with chest pain continuously coming back. Patient was seen and assessed by Cardiology - patient to be admitted to the hospital under the care of cardiology regarding chest pain, suspicion to be angina - patient to be admitted secondary to high risk factors and strong cardiac history. Patient admitted to [facility] secondary to being placed on Nitro drip - admitted to Cardiology. Discussed labs in great detail with patient and husband - discussed plan for admission which both agreed to. Patient stable for transfer.   Jamse Mead, PA-C 03/03/14 [phone]

## 2014-03-02 NOTE — ED Notes (Signed)
Cardiology at bedside.

## 2014-03-02 NOTE — ED Notes (Signed)
Per cardiology, if pt has another episode of pain, pt is to be laid flat.

## 2014-03-02 NOTE — Progress Notes (Signed)
ANTICOAGULATION CONSULT NOTE - Initial Consult  Pharmacy Consult for heparin Indication: chest pain/ACS  Allergies  Allergen Reactions  . Prednisone Other (See Comments)    MEMORY LOSS   . Codeine Rash  . Morphine Hives, Itching and Rash  . Penicillins Itching and Rash  . Sulfonamide Derivatives Itching and Rash  . Hydrocodone-Acetaminophen Other (See Comments)    unknown  . Meperidine Hcl Itching  . Metformin Other (See Comments)    nervous  . Metoclopramide Hcl Itching and Rash  . Metoprolol Succinate Other (See Comments)    nervous  . Moxifloxacin Itching  . Oxycodone-Aspirin Rash  . Pentazocine Lactate Nausea Only  . Pioglitazone Other (See Comments)    bloating  . Tramadol Hcl Other (See Comments)    unknown    Patient Measurements:   Heparin Dosing Weight: 61.8 kg  Vital Signs: Temp: 97.4 F (36.3 C) (03/28 1538) Temp src: Oral (03/28 1538) BP: 108/63 mmHg (03/28 2120) Pulse Rate: 60 (03/28 2120)  Labs:  Recent Labs  03/02/14 1540  HGB 15.8*  HCT 46.0  PLT 263  CREATININE 0.90    The CrCl is unknown because both a height and weight (above a minimum accepted value) are required for this calculation.   Medical History: Past Medical History  Diagnosis Date  . Hypothyroidism   . Hyperlipidemia   . GERD (gastroesophageal reflux disease)   . Anxiety   . Diabetes mellitus   . Hypertension   . PVD (peripheral vascular disease)   . Carotid bruit   . Other dysphagia   . Other esophagitis   . Hemorrhoid   . Other constipation   . Colon, diverticulosis   . Hiatal hernia   . Osteoarthrosis, unspecified whether generalized or localized, unspecified site   . Degenerative joint disease   . Esophageal stricture   . Unspecified hearing loss   . Diastolic dysfunction   . CAD (coronary artery disease)   . Shingles     Medications:  Prescriptions prior to admission  Medication Sig Dispense Refill  . aspirin 325 MG EC tablet Take 325 mg by mouth  daily.        . benzonatate (TESSALON) 200 MG capsule Take 1 capsule (200 mg total) by mouth 2 (two) times daily as needed for cough.  20 capsule  0  . DULoxetine (CYMBALTA) 60 MG capsule Take 60 mg by mouth daily.      Marland Kitchen glimepiride (AMARYL) 2 MG tablet Take 2 mg by mouth daily before breakfast.       . guanFACINE (INTUNIV) 1 MG TB24 Take 1.5 mg by mouth daily.      Marland Kitchen levothyroxine (SYNTHROID, LEVOTHROID) 88 MCG tablet Take 88 mcg by mouth daily.      Marland Kitchen loteprednol (ALREX) 0.2 % SUSP Place 1 drop into both eyes daily as needed. For itchy eyes      . metFORMIN (GLUCOPHAGE) 500 MG tablet Take 500 mg by mouth daily with breakfast.       . nitroGLYCERIN (NITROSTAT) 0.4 MG SL tablet Place 0.4 mg under the tongue every 5 (five) minutes as needed. For chest pain      . rosuvastatin (CRESTOR) 5 MG tablet Take 5 mg by mouth daily.      . traZODone (DESYREL) 150 MG tablet Take 225 mg by mouth at bedtime.        Assessment: 80 yo lady to start heparin for CP.  She was not on anticoagulants PTA.  Baseline Hg 15.8 Goal of  Therapy:  Heparin level 0.3-0.7 units/ml Monitor platelets by anticoagulation protocol: Yes   Plan:  Heparin 3000 unit bolus and drip at 950 units/hr Check heparin level 6 hours after start Daily HL and CBC while on heparin.   Haizel Gatchell, [place] 03/02/2014,9:54 PM

## 2014-03-02 NOTE — ED Notes (Signed)
Pt reporting increased radiation down left arm.  Pt wincing in bed in obvious pain.

## 2014-03-03 DIAGNOSIS — I2582 Chronic total occlusion of coronary artery: Secondary | ICD-10-CM | POA: Diagnosis not present

## 2014-03-03 DIAGNOSIS — Z950 Presence of cardiac pacemaker: Secondary | ICD-10-CM | POA: Diagnosis not present

## 2014-03-03 DIAGNOSIS — R413 Other amnesia: Secondary | ICD-10-CM

## 2014-03-03 DIAGNOSIS — I214 Non-ST elevation (NSTEMI) myocardial infarction: Secondary | ICD-10-CM

## 2014-03-03 DIAGNOSIS — I517 Cardiomegaly: Secondary | ICD-10-CM

## 2014-03-03 DIAGNOSIS — T82897A Other specified complication of cardiac prosthetic devices, implants and grafts, initial encounter: Secondary | ICD-10-CM | POA: Diagnosis not present

## 2014-03-03 DIAGNOSIS — I25810 Atherosclerosis of coronary artery bypass graft(s) without angina pectoris: Secondary | ICD-10-CM | POA: Diagnosis not present

## 2014-03-03 DIAGNOSIS — K222 Esophageal obstruction: Secondary | ICD-10-CM | POA: Diagnosis not present

## 2014-03-03 LAB — CBC
HCT: 38.2 % (ref 36.0–46.0)
Hemoglobin: 13.2 g/dL (ref 12.0–15.0)
MCH: 29.7 pg (ref 26.0–34.0)
MCHC: 34.6 g/dL (ref 30.0–36.0)
MCV: 85.8 fL (ref 78.0–100.0)
PLATELETS: 218 10*3/uL (ref 150–400)
RBC: 4.45 MIL/uL (ref 3.87–5.11)
RDW: 13.5 % (ref 11.5–15.5)
WBC: 7 10*3/uL (ref 4.0–10.5)

## 2014-03-03 LAB — TROPONIN I
TROPONIN I: 0.93 ng/mL — AB (ref <0.30)
Troponin I: 0.7 ng/mL (ref <0.30)

## 2014-03-03 LAB — BASIC METABOLIC PANEL
BUN: 11 mg/dL (ref 6–23)
CALCIUM: 8.4 mg/dL (ref 8.4–10.5)
CO2: 22 mEq/L (ref 19–32)
Chloride: 102 mEq/L (ref 96–112)
Creatinine, Ser: 0.8 mg/dL (ref 0.50–1.10)
GFR, EST AFRICAN AMERICAN: 77 mL/min — AB (ref >90)
GFR, EST NON AFRICAN AMERICAN: 67 mL/min — AB (ref >90)
Glucose, Bld: 146 mg/dL — ABNORMAL HIGH (ref 70–99)
POTASSIUM: 4.2 meq/L (ref 3.7–5.3)
Sodium: 136 mEq/L — ABNORMAL LOW (ref 137–147)

## 2014-03-03 LAB — LIPID PANEL
CHOLESTEROL: 190 mg/dL (ref 0–200)
HDL: 27 mg/dL — ABNORMAL LOW (ref >39)
LDL Cholesterol: 107 mg/dL — ABNORMAL HIGH (ref 0–99)
Total CHOL/HDL Ratio: 7 RATIO
Triglycerides: 279 mg/dL — ABNORMAL HIGH (ref <150)
VLDL: 56 mg/dL — ABNORMAL HIGH (ref 0–40)

## 2014-03-03 LAB — HEMOGLOBIN A1C
Hgb A1c MFr Bld: 7.6 % — ABNORMAL HIGH (ref <5.7)
Mean Plasma Glucose: 171 mg/dL — ABNORMAL HIGH (ref <117)

## 2014-03-03 LAB — HEPARIN LEVEL (UNFRACTIONATED)
HEPARIN UNFRACTIONATED: 0.31 [IU]/mL (ref 0.30–0.70)
Heparin Unfractionated: 0.38 IU/mL (ref 0.30–0.70)

## 2014-03-03 LAB — PROTIME-INR
INR: 1.06 (ref 0.00–1.49)
Prothrombin Time: 13.6 seconds (ref 11.6–15.2)

## 2014-03-03 LAB — GLUCOSE, CAPILLARY
GLUCOSE-CAPILLARY: 109 mg/dL — AB (ref 70–99)
GLUCOSE-CAPILLARY: 135 mg/dL — AB (ref 70–99)
GLUCOSE-CAPILLARY: 197 mg/dL — AB (ref 70–99)
Glucose-Capillary: 142 mg/dL — ABNORMAL HIGH (ref 70–99)

## 2014-03-03 LAB — MRSA PCR SCREENING: MRSA by PCR: NEGATIVE

## 2014-03-03 MED ORDER — METOPROLOL TARTRATE 25 MG PO TABS
25.0000 mg | ORAL_TABLET | Freq: Two times a day (BID) | ORAL | Status: DC
  Administered 2014-03-04 – 2014-03-06 (×4): 25 mg via ORAL
  Filled 2014-03-03 (×8): qty 1

## 2014-03-03 MED ORDER — ISOSORBIDE MONONITRATE ER 30 MG PO TB24
30.0000 mg | ORAL_TABLET | Freq: Every day | ORAL | Status: DC
  Administered 2014-03-03 – 2014-03-04 (×2): 30 mg via ORAL
  Filled 2014-03-03 (×3): qty 1

## 2014-03-03 MED ORDER — SODIUM CHLORIDE 0.9 % IV SOLN: INTRAVENOUS | Status: DC

## 2014-03-03 NOTE — Progress Notes (Signed)
03/03/14-1219-J.Deone Omahoney,RN,BSN      Admit to inpatient order noted for 03/02/14, however patient still showing in observation status. Bill stop and note entered. Duplicate order entered to change patient to inpatient status.

## 2014-03-03 NOTE — ED Provider Notes (Signed)
Medical screening examination/treatment/procedure(s) were performed by non-physician practitioner and as supervising physician I was immediately available for consultation/collaboration.   EKG Interpretation   Date/Time:  Saturday March 02 2014 15:35:32 EDT Ventricular Rate:  83 PR Interval:  132 QRS Duration: 98 QT Interval:  382 QTC Calculation: 448 R Axis:   83 Text Interpretation:  Electronic atrial pacemaker ST \\T \ T wave  abnormality, consider inferolateral ischemia Abnormal ECG No significant  change since last tracing Confirmed by BEATON  MD, ROBERT ([id number]) on  03/02/2014 3:39:15 PM        Tanna Furry, MD 03/03/14 2021

## 2014-03-03 NOTE — Progress Notes (Signed)
   Cardiologist: Dr. Burt Knack  Subjective:  Complains of chest pain off and on. IV infiltrated.  Spoke to husband.   Objective:  Vital Signs in the last 24 hours: Temp:  [97.1 F (36.2 C)-98.1 F (36.7 C)] 98.1 F (36.7 C) (03/29 1121) Pulse Rate:  [56-134] 66 (03/29 1200) Resp:  [15-28] 22 (03/29 1200) BP: (82-156)/(38-99) 125/61 mmHg (03/29 1200) SpO2:  [92 %-100 %] 96 % (03/29 1200) Weight:  [136 lb 11 oz (62 kg)] 136 lb 11 oz (62 kg) (03/29 0044)  Intake/Output from previous day: 03/28 0701 - 03/29 0700 In: 238.7 [P.O.:120; I.V.:118.7] Out: 550 [Urine:550]   Physical Exam: General: Well developed, elderly, in no acute distress. Head:  Normocephalic and atraumatic. Lungs: Clear to auscultation and percussion. Heart: Normal S1 and S2.  Soft systolic murmur RUSB, rubs or gallops.  Abdomen: soft, non-tender, positive bowel sounds. Extremities: No clubbing or cyanosis. No edema. Neurologic: Alert, non focal, mild dementia.    Lab Results:  Recent Labs  03/02/14 1540 03/03/14 0330  WBC 7.3 7.0  HGB 15.8* 13.2  PLT 263 218    Recent Labs  03/02/14 1540 03/03/14 0330  NA 138 136*  K 4.4 4.2  CL 98 102  CO2 25 22  GLUCOSE 178* 146*  BUN 15 11  CREATININE 0.90 0.80    Recent Labs  03/03/14 0330 03/03/14 1130  TROPONINI 0.93* 0.70*   Hepatic Function Panel   Recent Labs  03/03/14 0330  CHOL 190   Imaging: Dg Chest 2 View  03/02/2014   CLINICAL DATA:  Chest pain.  Left arm weakness.  EXAM: CHEST  2 VIEW  COMPARISON:  11/01/2013  FINDINGS: Insert COPD left pacer is unchanged. Prior CABG. Scarring in the left base/lingula, unchanged. No acute opacities. No effusions. No acute bony abnormality.  IMPRESSION: No acute cardiopulmonary disease.  COPD/ chronic changes.   Electronically Signed   By: Rolm Baptise M.D.   On: 03/02/2014 18:59   Personally viewed.   Telemetry: No adverse rhythms. Personally viewed.   EKG:  Biphasic T wave ant precordial  leads. A paced.   Cardiac Studies:  ECHO 2013 - normal EF  Assessment/Plan:   80 year old with CABG, CAD, +trop, NSEMI - mild, dementia.   1) NSTEMI  - Discussed with her at length catheterization and she clearly does not want to proceed with this. She stated over and over that she does not want a stent and she states that she can not have a stent. I called her husband and discussed as well. After careful deliberation we have decided to continue with aggressive medical management. In elderly female population with mild Troponin elevation, there may be minimal difference in long term outcome with invasive vs. Non invasive strategy.   - Heparin IV  - Starting IMDUR 30  - Increased metop to 25 BID (anti anginal)  2) CAD  - post CABG  - mild troponin elevation which is now coming down.   - increased risk over next 30 days  - aggressive secondary prevention.  Will continue to keep family updated.  DNR    SKAINS, [place] 03/03/2014, 12:24 PM

## 2014-03-03 NOTE — Progress Notes (Signed)
CRITICAL VALUE ALERT  Critical value received:  Troponin 0.46  Date of notification:  03/03/2014   Time of notification:  0000  Critical value read back:yes  Nurse who received alert:  Mike Craze   MD notified (1st page):  Dr Tommi Rumps   Time of first page:  0015  MD notified (2nd page):  Time of second page:  Responding MD:    Time MD responded:

## 2014-03-03 NOTE — Progress Notes (Signed)
Arrived to room for PIV restart, pt yelling loudly for nurse.  Staff RN entering room, pt needs to use BSC.  Will return to later to assess pt for PIV, if pt cooperative.

## 2014-03-03 NOTE — Progress Notes (Signed)
  Echocardiogram 2D Echocardiogram has been performed.  Mauricio Po 03/03/2014, 6:02 PM

## 2014-03-03 NOTE — Progress Notes (Signed)
03-03-14    1110am   IV Team Note;  Called to restart iv for pt getting heparin and nitro; staff attempted without success; pt very particular about where she wants her iv;  Right forearm is quite swollen, bruised from infiltration;  No other access noted, except in her left ant forearm area;  Pt does not want her iv put in her left forearm;  Another IV RN to see pt when able;  RN aware of no access and the heparin and nitro have been off for some time;    Raynelle Fanning RN IV Team

## 2014-03-03 NOTE — Progress Notes (Signed)
ANTICOAGULATION CONSULT NOTE - Follow Up Consult  Pharmacy Consult for Heparin Indication: chest pain/ACS  Allergies  Allergen Reactions  . Prednisone Other (See Comments)    MEMORY LOSS   . Codeine Rash  . Morphine Hives, Itching and Rash  . Penicillins Itching and Rash  . Sulfonamide Derivatives Itching and Rash  . Hydrocodone-Acetaminophen Other (See Comments)    unknown  . Meperidine Hcl Itching  . Metformin Other (See Comments)    nervous  . Metoclopramide Hcl Itching and Rash  . Metoprolol Succinate Other (See Comments)    nervous  . Moxifloxacin Itching  . Oxycodone-Aspirin Rash  . Pentazocine Lactate Nausea Only  . Pioglitazone Other (See Comments)    bloating  . Tramadol Hcl Other (See Comments)    unknown    Patient Measurements: Height: 5\' 1"  (154.9 cm) Weight: 136 lb 11 oz (62 kg) IBW/kg (Calculated) : 47.8 Heparin Dosing Weight: 62 kg  Vital Signs: Temp: 97.8 F (36.6 C) (03/29 2000) Temp src: Oral (03/29 2000) BP: 88/47 mmHg (03/29 2000) Pulse Rate: 65 (03/29 2000)  Labs:  Recent Labs  03/02/14 1540 03/02/14 2245 03/03/14 0330 03/03/14 1130 03/03/14 2016  HGB 15.8*  --  13.2  --   --   HCT 46.0  --  38.2  --   --   PLT 263  --  218  --   --   LABPROT  --   --  13.6  --   --   INR  --   --  1.06  --   --   HEPARINUNFRC  --   --  0.38  --  0.31  CREATININE 0.90  --  0.80  --   --   TROPONINI  --  0.46* 0.93* 0.70*  --     Estimated Creatinine Clearance: 45.8 ml/min (by C-G formula based on Cr of 0.8).   Medications:  Heparin @ 950 units/hr (9.5 ml/hr)  Assessment: 80 y.o. F who continues on heparin for NSTEMI. It was noted that the patient lost IV access earlier today and was restarted around 1300 after regaining access. The heparin level this evening is therapeutic (HL 0.31, goal of 0.3-0.7). No overt s/sx of bleeding noted. Will continue at current rate.  Goal of Therapy:  Heparin level 0.3-0.7 units/ml Monitor platelets by  anticoagulation protocol: Yes   Plan:  1. Continue heparin at 950 units/hr (9.5 ml/hr) 2. Will continue to monitor for any signs/symptoms of bleeding and will follow up with heparin level in the a.m.   Alycia Rossetti, PharmD, BCPS Clinical Pharmacist Pager: [phone] 03/03/2014 9:25 PM

## 2014-03-03 NOTE — Progress Notes (Signed)
Utilization review completed.  P.J. Asani Deniston,RN,BSN Case Manager 

## 2014-03-04 ENCOUNTER — Other Ambulatory Visit: Payer: Self-pay | Admitting: Internal Medicine

## 2014-03-04 ENCOUNTER — Encounter: Payer: Medicare Other | Admitting: Internal Medicine

## 2014-03-04 ENCOUNTER — Encounter (HOSPITAL_COMMUNITY): Payer: Self-pay | Admitting: Interventional Cardiology

## 2014-03-04 DIAGNOSIS — I252 Old myocardial infarction: Secondary | ICD-10-CM | POA: Diagnosis not present

## 2014-03-04 DIAGNOSIS — T82897A Other specified complication of cardiac prosthetic devices, implants and grafts, initial encounter: Secondary | ICD-10-CM | POA: Diagnosis not present

## 2014-03-04 DIAGNOSIS — I2582 Chronic total occlusion of coronary artery: Secondary | ICD-10-CM | POA: Diagnosis not present

## 2014-03-04 DIAGNOSIS — I214 Non-ST elevation (NSTEMI) myocardial infarction: Secondary | ICD-10-CM | POA: Diagnosis not present

## 2014-03-04 DIAGNOSIS — K222 Esophageal obstruction: Secondary | ICD-10-CM | POA: Diagnosis not present

## 2014-03-04 DIAGNOSIS — R9431 Abnormal electrocardiogram [ECG] [EKG]: Secondary | ICD-10-CM | POA: Diagnosis not present

## 2014-03-04 DIAGNOSIS — I25810 Atherosclerosis of coronary artery bypass graft(s) without angina pectoris: Secondary | ICD-10-CM | POA: Diagnosis not present

## 2014-03-04 LAB — GLUCOSE, CAPILLARY
Glucose-Capillary: 153 mg/dL — ABNORMAL HIGH (ref 70–99)
Glucose-Capillary: 160 mg/dL — ABNORMAL HIGH (ref 70–99)
Glucose-Capillary: 222 mg/dL — ABNORMAL HIGH (ref 70–99)
Glucose-Capillary: 173 mg/dL — ABNORMAL HIGH (ref 70–99)

## 2014-03-04 LAB — CBC
HCT: 37.9 % (ref 36.0–46.0)
Hemoglobin: 13.1 g/dL (ref 12.0–15.0)
MCH: 29.4 pg (ref 26.0–34.0)
MCHC: 34.6 g/dL (ref 30.0–36.0)
MCV: 85.2 fL (ref 78.0–100.0)
Platelets: 225 10*3/uL (ref 150–400)
RBC: 4.45 MIL/uL (ref 3.87–5.11)
RDW: 13.5 % (ref 11.5–15.5)
WBC: 6.2 10*3/uL (ref 4.0–10.5)

## 2014-03-04 LAB — HEPARIN LEVEL (UNFRACTIONATED): Heparin Unfractionated: 0.33 IU/mL (ref 0.30–0.70)

## 2014-03-04 MED ORDER — NITROGLYCERIN 0.4 MG SL SUBL: 0.4000 mg | SUBLINGUAL_TABLET | SUBLINGUAL | Status: DC | PRN

## 2014-03-04 NOTE — Progress Notes (Signed)
ANTICOAGULATION CONSULT NOTE - Follow Up Consult  Pharmacy Consult for Heparin Indication: chest pain/ACS  Allergies  Allergen Reactions  . Prednisone Other (See Comments)    MEMORY LOSS   . Codeine Rash  . Morphine Hives, Itching and Rash  . Penicillins Itching and Rash  . Sulfonamide Derivatives Itching and Rash  . Hydrocodone-Acetaminophen Other (See Comments)    unknown  . Meperidine Hcl Itching  . Metformin Other (See Comments)    nervous  . Metoclopramide Hcl Itching and Rash  . Metoprolol Succinate Other (See Comments)    nervous  . Moxifloxacin Itching  . Oxycodone-Aspirin Rash  . Pentazocine Lactate Nausea Only  . Pioglitazone Other (See Comments)    bloating  . Tramadol Hcl Other (See Comments)    unknown    Patient Measurements: Height: 5\' 1"  (154.9 cm) Weight: 136 lb 11 oz (62 kg) IBW/kg (Calculated) : 47.8 Heparin Dosing Weight: 62 kg  Vital Signs: Temp: 98.1 F (36.7 C) (03/30 0819) Temp src: Oral (03/30 0819) BP: 115/47 mmHg (03/30 0819) Pulse Rate: 56 (03/30 0400)  Labs:  Recent Labs  03/02/14 1540 03/02/14 2245 03/03/14 0330 03/03/14 1130 03/03/14 2016 03/04/14 0340  HGB 15.8*  --  13.2  --   --  13.1  HCT 46.0  --  38.2  --   --  37.9  PLT 263  --  218  --   --  225  LABPROT  --   --  13.6  --   --   --   INR  --   --  1.06  --   --   --   HEPARINUNFRC  --   --  0.38  --  0.31 0.33  CREATININE 0.90  --  0.80  --   --   --   TROPONINI  --  0.46* 0.93* 0.70*  --   --     Estimated Creatinine Clearance: 45.8 ml/min (by C-G formula based on Cr of 0.8).   Medications:  Heparin @ 950 units/hr (9.5 ml/hr)  Assessment: 80 y.o. F who continues on heparin for NSTEMI. The heparin level this AM is therapeutic (HL 0.33, goal of 0.3-0.7). Patient refused stent and plan is for aggressive medical management. Pt reports no s/s of bleeding. CBC remains stable. Will increase rate since HL at lower end of goal range   Goal of Therapy:  Heparin  level 0.3-0.7 units/ml Monitor platelets by anticoagulation protocol: Yes   Plan:  1. Increase heparin infusion to 1000 units/hr  2. Will continue to monitor for any signs/symptoms of bleeding and will follow up daily HL   Albertina Parr, PharmD.  Clinical Pharmacist Pager [phone]

## 2014-03-04 NOTE — Progress Notes (Addendum)
SUBJECTIVE: No further chest pain   OBJECTIVE:   Vitals:   Filed Vitals:   03/04/14 0000 03/04/14 0400 03/04/14 0819 03/04/14 1200  BP: 100/47 95/45 115/47 102/51  Pulse: 64 56    Temp: 98.4 F (36.9 C) 97.9 F (36.6 C) 98.1 F (36.7 C) 98.1 F (36.7 C)  TempSrc: Oral Oral Oral Oral  Resp: 23     Height:      Weight:      SpO2: 97% 95% 94% 93%   I&O's:   Intake/Output Summary (Last 24 hours) at 03/04/14 1358 Last data filed at 03/04/14 1300  Gross per 24 hour  Intake    985 ml  Output    725 ml  Net    260 ml   TELEMETRY: Reviewed telemetry pt in normal sinus rhythm:     PHYSICAL EXAM General: Well developed, well nourished, in no acute distress Head: Normal cephalic and atramatic  Lungs:  Clear bilaterally to auscultation and percussion. Heart:  HRRR S1 S2 No JVD.  No abdominal bruits. No femoral bruits. Abdomen: abdomen soft and non-tender Msk:  Back normal, normal gait. Normal strength and tone for age. Extremities:  No edema.  DP +1 Neuro: Alert  Psych:  Normal affect, responds appropriately   LABS: Basic Metabolic Panel:  Recent Labs  03/02/14 1540 03/03/14 0330  NA 138 136*  K 4.4 4.2  CL 98 102  CO2 25 22  GLUCOSE 178* 146*  BUN 15 11  CREATININE 0.90 0.80  CALCIUM 9.8 8.4   Liver Function Tests: No results found for this basename: AST, ALT, ALKPHOS, BILITOT, PROT, ALBUMIN,  in the last 72 hours No results found for this basename: LIPASE, AMYLASE,  in the last 72 hours CBC:  Recent Labs  03/03/14 0330 03/04/14 0340  WBC 7.0 6.2  HGB 13.2 13.1  HCT 38.2 37.9  MCV 85.8 85.2  PLT 218 225   Cardiac Enzymes:  Recent Labs  03/02/14 2245 03/03/14 0330 03/03/14 1130  TROPONINI 0.46* 0.93* 0.70*   BNP: No components found with this basename: POCBNP,  D-Dimer: No results found for this basename: DDIMER,  in the last 72 hours Hemoglobin A1C:  Recent Labs  03/02/14 2230  HGBA1C 7.6*   Fasting Lipid Panel:  Recent Labs  03/03/14 0330  CHOL 190  HDL 27*  LDLCALC 107*  TRIG 279*  CHOLHDL 7.0   Thyroid Function Tests: No results found for this basename: TSH, T4TOTAL, FREET3, T3FREE, THYROIDAB,  in the last 72 hours Anemia Panel: No results found for this basename: VITAMINB12, FOLATE, FERRITIN, TIBC, IRON, RETICCTPCT,  in the last 72 hours Coag Panel:   Lab Results  Component Value Date   INR 1.06 03/03/2014    RADIOLOGY: Dg Chest 2 View  03/02/2014   CLINICAL DATA:  Chest pain.  Left arm weakness.  EXAM: CHEST  2 VIEW  COMPARISON:  11/01/2013  FINDINGS: Insert COPD left pacer is unchanged. Prior CABG. Scarring in the left base/lingula, unchanged. No acute opacities. No effusions. No acute bony abnormality.  IMPRESSION: No acute cardiopulmonary disease.  COPD/ chronic changes.   Electronically Signed   By: Rolm Baptise M.D.   On: 03/02/2014 18:59      ASSESSMENT: NSTEMI  PLAN:  Known coronary artery disease with bypass surgery many years ago followed by redo. She was clear yesterday that she did not want a cardiac cath. Despite her non-STEMI. She is a little more hesitant today. She is wondering if that may be  the best option. She has been symptom free. We discussed the options of medical therapy versus cardiac cath. She is still hoping to avoid a cardiac cath. We'll continue long-acting nitrates and beta blocker. She has been up moving around in her room. If she has any further symptoms, we'll reconsider cardiac cath. Will stop heparin tonight after 48 hours total of anticoagulation. If he remains symptom free with walking off of the heparin, we'll plan on discharge tomorrow.  Continue atorvastatin given CAD. LDL target 70.  Echocardiogram revealed preserved LV systolic function.  No signs of heart failure despite MI.  Abnormal ECG: More pronounced anterior ST-T wave changes on recent ECG.  Jettie Booze., MD  03/04/2014  1:58 PM

## 2014-03-05 ENCOUNTER — Encounter (HOSPITAL_COMMUNITY): Payer: Self-pay | Admitting: Interventional Cardiology

## 2014-03-05 ENCOUNTER — Encounter (HOSPITAL_COMMUNITY)
Admission: EM | Disposition: A | Discharge status: Home / Self Care | Payer: Self-pay | Source: Home / Self Care | Attending: Cardiovascular Disease

## 2014-03-05 DIAGNOSIS — I214 Non-ST elevation (NSTEMI) myocardial infarction: Secondary | ICD-10-CM | POA: Diagnosis not present

## 2014-03-05 DIAGNOSIS — T82897A Other specified complication of cardiac prosthetic devices, implants and grafts, initial encounter: Secondary | ICD-10-CM | POA: Diagnosis not present

## 2014-03-05 DIAGNOSIS — I2510 Atherosclerotic heart disease of native coronary artery without angina pectoris: Secondary | ICD-10-CM | POA: Diagnosis not present

## 2014-03-05 DIAGNOSIS — K222 Esophageal obstruction: Secondary | ICD-10-CM | POA: Diagnosis not present

## 2014-03-05 DIAGNOSIS — I2582 Chronic total occlusion of coronary artery: Secondary | ICD-10-CM | POA: Diagnosis not present

## 2014-03-05 HISTORY — PX: LEFT HEART CATHETERIZATION WITH CORONARY/GRAFT ANGIOGRAM: SHX5450

## 2014-03-05 LAB — GLUCOSE, CAPILLARY
GLUCOSE-CAPILLARY: 156 mg/dL — AB (ref 70–99)
Glucose-Capillary: 159 mg/dL — ABNORMAL HIGH (ref 70–99)

## 2014-03-05 LAB — CBC
HCT: 40.3 % (ref 36.0–46.0)
Hemoglobin: 13.8 g/dL (ref 12.0–15.0)
MCH: 28.9 pg (ref 26.0–34.0)
MCHC: 34.2 g/dL (ref 30.0–36.0)
MCV: 84.5 fL (ref 78.0–100.0)
Platelets: 226 10*3/uL (ref 150–400)
RBC: 4.77 MIL/uL (ref 3.87–5.11)
RDW: 13.5 % (ref 11.5–15.5)
WBC: 5.6 10*3/uL (ref 4.0–10.5)

## 2014-03-05 SURGERY — LEFT HEART CATHETERIZATION WITH CORONARY/GRAFT ANGIOGRAM
Anesthesia: LOCAL

## 2014-03-05 MED ORDER — HEPARIN (PORCINE) IN NACL 2-0.9 UNIT/ML-% IJ SOLN
INTRAMUSCULAR | Status: AC
  Filled 2014-03-05: qty 1000

## 2014-03-05 MED ORDER — SODIUM CHLORIDE 0.9 % IV SOLN: 250.0000 mL | INTRAVENOUS | Status: DC | PRN

## 2014-03-05 MED ORDER — NITROGLYCERIN 0.2 MG/ML ON CALL CATH LAB
INTRAVENOUS | Status: AC
  Filled 2014-03-05: qty 1

## 2014-03-05 MED ORDER — ISOSORBIDE MONONITRATE ER 60 MG PO TB24
60.0000 mg | ORAL_TABLET | Freq: Every day | ORAL | Status: DC
  Administered 2014-03-05: 60 mg via ORAL
  Filled 2014-03-05: qty 1

## 2014-03-05 MED ORDER — SODIUM CHLORIDE 0.9 % IJ SOLN
3.0000 mL | Freq: Two times a day (BID) | INTRAMUSCULAR | Status: DC
  Administered 2014-03-05: 3 mL via INTRAVENOUS

## 2014-03-05 MED ORDER — SODIUM CHLORIDE 0.9 % IV SOLN
10.0000 mL/h | INTRAVENOUS | Status: DC
  Administered 2014-03-05: 10 mL/h via INTRAVENOUS

## 2014-03-05 MED ORDER — ONDANSETRON HCL 4 MG/2ML IJ SOLN: 4.0000 mg | Freq: Four times a day (QID) | INTRAMUSCULAR | Status: DC | PRN

## 2014-03-05 MED ORDER — SODIUM CHLORIDE 0.9 % IV SOLN: 1.0000 mL/kg/h | INTRAVENOUS | Status: AC

## 2014-03-05 MED ORDER — HEPARIN (PORCINE) IN NACL 100-0.45 UNIT/ML-% IJ SOLN
1000.0000 [IU]/h | INTRAMUSCULAR | Status: DC
  Administered 2014-03-05: 1000 [IU]/h via INTRAVENOUS
  Filled 2014-03-05 (×2): qty 250

## 2014-03-05 MED ORDER — LIDOCAINE HCL (PF) 1 % IJ SOLN
INTRAMUSCULAR | Status: AC
  Filled 2014-03-05: qty 30

## 2014-03-05 MED ORDER — ACETAMINOPHEN 325 MG PO TABS: 650.0000 mg | ORAL_TABLET | ORAL | Status: DC | PRN

## 2014-03-05 MED ORDER — ISOSORBIDE MONONITRATE ER 60 MG PO TB24
120.0000 mg | ORAL_TABLET | Freq: Every day | ORAL | Status: DC
  Administered 2014-03-06: 120 mg via ORAL
  Filled 2014-03-05: qty 2

## 2014-03-05 MED ORDER — MIDAZOLAM HCL 2 MG/2ML IJ SOLN
INTRAMUSCULAR | Status: AC
  Filled 2014-03-05: qty 2

## 2014-03-05 MED ORDER — SODIUM CHLORIDE 0.9 % IJ SOLN: 3.0000 mL | INTRAMUSCULAR | Status: DC | PRN

## 2014-03-05 MED ORDER — FENTANYL CITRATE 0.05 MG/ML IJ SOLN
INTRAMUSCULAR | Status: AC
  Filled 2014-03-05: qty 2

## 2014-03-05 MED ORDER — ASPIRIN 81 MG PO CHEW: 81.0000 mg | CHEWABLE_TABLET | ORAL | Status: DC

## 2014-03-05 MED ORDER — SODIUM CHLORIDE 0.9 % IV SOLN: INTRAVENOUS | Status: DC

## 2014-03-05 NOTE — Progress Notes (Signed)
ANTICOAGULATION CONSULT NOTE - Follow Up Consult> restart heparin  Pharmacy Consult for Heparin Indication: chest pain/ACS  Allergies  Allergen Reactions  . Prednisone Other (See Comments)    MEMORY LOSS   . Codeine Rash  . Morphine Hives, Itching and Rash  . Penicillins Itching and Rash  . Sulfonamide Derivatives Itching and Rash  . Hydrocodone-Acetaminophen Other (See Comments)    unknown  . Meperidine Hcl Itching  . Metformin Other (See Comments)    nervous  . Metoclopramide Hcl Itching and Rash  . Metoprolol Succinate Other (See Comments)    nervous  . Moxifloxacin Itching  . Oxycodone-Aspirin Rash  . Pentazocine Lactate Nausea Only  . Pioglitazone Other (See Comments)    bloating  . Tramadol Hcl Other (See Comments)    unknown    Patient Measurements: Height: 5\' 1"  (154.9 cm) Weight: 136 lb 11 oz (62 kg) IBW/kg (Calculated) : 47.8 Heparin Dosing Weight: 62 kg  Vital Signs: Temp: 98 F (36.7 C) (03/31 0813) Temp src: Oral (03/31 0813) BP: 100/56 mmHg (03/31 0823) Pulse Rate: 65 (03/31 0804)  Labs:  Recent Labs  03/02/14 1540 03/02/14 2245 03/03/14 0330 03/03/14 1130 03/03/14 2016 03/04/14 0340 03/05/14 0350  HGB 15.8*  --  13.2  --   --  13.1 13.8  HCT 46.0  --  38.2  --   --  37.9 40.3  PLT 263  --  218  --   --  225 226  LABPROT  --   --  13.6  --   --   --   --   INR  --   --  1.06  --   --   --   --   HEPARINUNFRC  --   --  0.38  --  0.31 0.33  --   CREATININE 0.90  --  0.80  --   --   --   --   TROPONINI  --  0.46* 0.93* 0.70*  --   --   --     Estimated Creatinine Clearance: 45.8 ml/min (by C-G formula based on Cr of 0.8).   Medications:  Heparin stopped last night. 2 episodes of CP.  Assessment: 80 y.o. F admitted 03/02/2014  with CP, now with recurrent CP and plan to go to cath lab today.  Will resume at 1000 units/hr, goal of 0.3-0.7). Pt reports no s/s of bleeding. CBC remains stable.   CV: CAD h/o CABG:  SBP 100-130, HR 60s  BB,  ASA, IMDUR dose increased to 60, atorva, guanfacine  Goal of Therapy:  Heparin level 0.3-0.7 units/ml Monitor platelets by anticoagulation protocol: Yes   Plan:  1. Restart heparin infusion to 1000 units/hr  2. Follow up after cath.  Thank you for allowing pharmacy to be a part of this patients care team.  Rowe Robert Pharm.D., BCPS Clinical Pharmacist 03/05/2014 8:26 AM Pager: [phone] Phone: [phone]

## 2014-03-05 NOTE — Progress Notes (Signed)
Called Cardiology on call, and informed them of pt having chest pain. Pt reported that chest pain quickly resolved after sublingual ntg. Will continue to monitor. Maree Krabbe RN

## 2014-03-05 NOTE — CV Procedure (Signed)
Left Heart Catheterization with Coronary and Bypass Angiography Report  MASHONDA PASLAY  80 y.o.  female Jan 22, 1929  Procedure Date: 03/05/2014  Referring Physician: Irish Lack, MD Primary Cardiologist: Sherren Mocha, M.D.  INDICATIONS: Acute coronary syndrome in this 80 year old with history of coronary artery bypass grafting 1982 and 2002  PROCEDURE: 1. Left heart catheterization; 2. Coronary angiography; 3. Left ventriculography; 4. Saphenous vein graft angiography; 5. Internal mammary artery angiography  CONSENT:  The risks, benefits, and details of the procedure were explained in detail to the patient. Risks including death, stroke, heart attack, kidney injury, allergy, limb ischemia, bleeding and radiation injury were discussed.  The patient verbalized understanding and wanted to proceed.  Informed written consent was obtained.  PROCEDURE TECHNIQUE:  After Xylocaine anesthesia a 5 French sheath was placed in the right femoral artery with a single anterior needle wall stick.  Coronary angiography was done using a 5 French A2 MP, left bypass graft catheter, 4 cm left Judkins catheter, and a 5 [place] internal mammary artery catheters.  Left ventriculography was done using the 5 French A2 multipurpose catheter and hand injection.   The arterial sheath was removed and hemostasis achieved by manual compression.  MEDICATIONS: Versed 1 mg IV and fentanyl 50 mcg IV    CONTRAST:  Total of 145 cc.  COMPLICATIONS:  None   HEMODYNAMICS:  Aortic pressure 87/47 mmHg; LV pressure 87/0 mmHg; LVEDP 8 mm mercury  ANGIOGRAPHIC DATA:   The left main coronary artery is patent.  The left anterior descending artery is totally occluded in the mid vessel and 99% in the proximal vessel. The proximal lesion is new. The vascular territory is small.  The left circumflex artery is totally occluded as home prior angiogram in 2002.  The right coronary artery is totally occluded.Marland Kitchen  BYPASS GRAFT  ANGIOGRAPHY:   The saphenous vein graft to the diagonal/LAD is totally occluded.  The saphenous vein graft to the circumflex is totally occluded.  The saphenous vein graft to the PDA is widely patent. There is diffuse disease in the connection between the PDA and 2 left ventricular branches. There is also moderate diffuse disease in the PDA beyond the graft insertion site. No high-grade focal stenoses are noted. Progression of the disease in the segment between the PDA and left ventricular branches is noted.  LIMA to the LAD is widely patent with a large diagonal also filling. The LAD supplies collaterals to the circumflex.   PCI RESULTS: Not performed  LEFT VENTRICULOGRAM:  Left ventricular angiogram was done in the 30 RAO projection and revealed severe inferobasal and mid inferior wall hypokinesis with an ejection fraction of 35-40% no significant mitral regurgitation is identified.   IMPRESSIONS:  1. Total occlusion of LAD, circumflex, and RCA. The LAD occlusion is mid vessel and there is a new 99% proximal stenosis. The vascular territory supplied between the 2 stenoses in the LAD is relatively small. This may be a source of some of the patient's angina.  2. Total occlusion of the saphenous vein graft to the circumflex; total occlusion of the saphenous vein graft to the diagonal/LAD; and widely patent saphenous vein graft to the PDA.  3. The saphenous vein graft to the PDA as mentioned above is widely patent and there is progression of disease in the continuation of the right coronary proximal to the origin of 2 left ventricular branches (the continuation LV branches fill retrograde off the PDA proximal to the graft insertion site).  4. Patent LIMA  to the LAD   RECOMMENDATION:  Continue aggressive medical therapy.

## 2014-03-05 NOTE — H&P (Signed)
Chart reviewed. Had CABG in 1882 and 2002. Now has refractory angina. Needs cath to define therapy and hopefully provide therapy to control symptoms. Explained to her that DNR would need to be rescinded. She is agreeable. Plan to proceed shortly. Risk of stroke, death, MI, kidney failure, limb ischemia, etc discussed and accepted.Cath Lab Visit (complete for each Cath Lab visit)  Clinical Evaluation Leading to the Procedure:   ACS: yes  Non-ACS:    Anginal Classification: CCS IV  Anti-ischemic medical therapy: Maximal Therapy (2 or more classes of medications)  Non-Invasive Test Results: No non-invasive testing performed  Prior CABG: Previous CABG

## 2014-03-05 NOTE — Progress Notes (Addendum)
SUBJECTIVE: Heparin turned off at 10 PM.  More chest pain at 2AM and 8AM.  OBJECTIVE:   Vitals:   Filed Vitals:   03/04/14 2300 03/05/14 0400 03/05/14 0804 03/05/14 0813  BP: 117/65 129/56 161/75 104/55  Pulse: 61 66 65   Temp: 98.4 F (36.9 C) 98.1 F (36.7 C)  98 F (36.7 C)  TempSrc: Oral Oral  Oral  Resp:   16 21  Height:      Weight:      SpO2: 92% 94% 94% 96%   I&O's:    Intake/Output Summary (Last 24 hours) at 03/05/14 [id number] Last data filed at 03/05/14 [id number]  Gross per 24 hour  Intake 1154.5 ml  Output   2850 ml  Net -1695.5 ml   TELEMETRY: Reviewed telemetry pt in normal sinus rhythm:     PHYSICAL EXAM General: Well developed, well nourished, in no acute distress Head: Normal cephalic and atramatic  Lungs:  Clear bilaterally to auscultation and percussion. Heart:  HRRR S1 S2 No JVD.  No abdominal bruits. No femoral bruits. Abdomen: abdomen soft and non-tender Msk:  Back normal, normal gait. Normal strength and tone for age. Extremities:  No edema.  DP +1 Neuro: Alert  Psych:  Normal affect, responds appropriately   LABS: Basic Metabolic Panel:  Recent Labs  03/02/14 1540 03/03/14 0330  NA 138 136*  K 4.4 4.2  CL 98 102  CO2 25 22  GLUCOSE 178* 146*  BUN 15 11  CREATININE 0.90 0.80  CALCIUM 9.8 8.4   Liver Function Tests: No results found for this basename: AST, ALT, ALKPHOS, BILITOT, PROT, ALBUMIN,  in the last 72 hours No results found for this basename: LIPASE, AMYLASE,  in the last 72 hours CBC:  Recent Labs  03/04/14 0340 03/05/14 0350  WBC 6.2 5.6  HGB 13.1 13.8  HCT 37.9 40.3  MCV 85.2 84.5  PLT 225 226   Cardiac Enzymes:  Recent Labs  03/02/14 2245 03/03/14 0330 03/03/14 1130  TROPONINI 0.46* 0.93* 0.70*   BNP: No components found with this basename: POCBNP,  D-Dimer: No results found for this basename: DDIMER,  in the last 72 hours Hemoglobin A1C:  Recent Labs  03/02/14 2230  HGBA1C 7.6*   Fasting Lipid  Panel:  Recent Labs  03/03/14 0330  CHOL 190  HDL 27*  LDLCALC 107*  TRIG 279*  CHOLHDL 7.0   Thyroid Function Tests: No results found for this basename: TSH, T4TOTAL, FREET3, T3FREE, THYROIDAB,  in the last 72 hours Anemia Panel: No results found for this basename: VITAMINB12, FOLATE, FERRITIN, TIBC, IRON, RETICCTPCT,  in the last 72 hours Coag Panel:   Lab Results  Component Value Date   INR 1.06 03/03/2014    RADIOLOGY: Dg Chest 2 View  03/02/2014   CLINICAL DATA:  Chest pain.  Left arm weakness.  EXAM: CHEST  2 VIEW  COMPARISON:  11/01/2013  FINDINGS: Insert COPD left pacer is unchanged. Prior CABG. Scarring in the left base/lingula, unchanged. No acute opacities. No effusions. No acute bony abnormality.  IMPRESSION: No acute cardiopulmonary disease.  COPD/ chronic changes.   Electronically Signed   By: Rolm Baptise M.D.   On: 03/02/2014 18:59      ASSESSMENT: NSTEMI  PLAN:  Known coronary artery disease with bypass surgery many years ago followed by redo. She was clear two days ago that she did not want a cardiac cath. Despite her non-STEMI. She was a little less hesitant yesterday. Now that she has  had 2 episodes of pain relieved with NTG, she is willing to have a cardiac cath.  We'll increase long-acting nitrates and beta blocker. Restart heparin now. Questions about cath answered.  She wants to inform her husband herself.  Continue atorvastatin given CAD. LDL target 70.  Echocardiogram revealed preserved LV systolic function.  No signs of heart failure despite MI.  Abnormal ECG: More pronounced anterior ST-T wave changes on recent ECG.  Jettie Booze., MD  03/05/2014  8:23 AM

## 2014-03-05 NOTE — Progress Notes (Signed)
Pt has been educated multiple times on how to use the call light. She calls frequently, but also fails to call. Pt has been found on several occasions getting out of the bed, with her leads off. Bed alarm is on, and pt educated on fall risk again.

## 2014-03-06 DIAGNOSIS — I214 Non-ST elevation (NSTEMI) myocardial infarction: Secondary | ICD-10-CM | POA: Diagnosis not present

## 2014-03-06 DIAGNOSIS — I25810 Atherosclerosis of coronary artery bypass graft(s) without angina pectoris: Secondary | ICD-10-CM | POA: Diagnosis not present

## 2014-03-06 DIAGNOSIS — K219 Gastro-esophageal reflux disease without esophagitis: Secondary | ICD-10-CM | POA: Diagnosis not present

## 2014-03-06 LAB — POCT ACTIVATED CLOTTING TIME: Activated Clotting Time: 110 seconds

## 2014-03-06 MED ORDER — ACETAMINOPHEN 325 MG PO TABS
650.0000 mg | ORAL_TABLET | Freq: Four times a day (QID) | ORAL | Status: DC | PRN
  Administered 2014-03-06: 650 mg via ORAL
  Filled 2014-03-06 (×2): qty 2

## 2014-03-06 MED ORDER — PANTOPRAZOLE SODIUM 40 MG PO TBEC: 40.0000 mg | DELAYED_RELEASE_TABLET | Freq: Every day | ORAL | Status: DC

## 2014-03-06 MED ORDER — ISOSORBIDE MONONITRATE ER 120 MG PO TB24: 120.0000 mg | ORAL_TABLET | Freq: Every day | ORAL | Status: DC

## 2014-03-06 MED ORDER — PANTOPRAZOLE SODIUM 40 MG PO TBEC
40.0000 mg | DELAYED_RELEASE_TABLET | Freq: Every day | ORAL | Status: DC
  Administered 2014-03-06: 40 mg via ORAL
  Filled 2014-03-06: qty 1

## 2014-03-06 MED ORDER — ATORVASTATIN CALCIUM 80 MG PO TABS
80.0000 mg | ORAL_TABLET | Freq: Every day | ORAL | Status: DC
Start: 2014-03-06 — End: 2014-12-02

## 2014-03-06 MED ORDER — GI COCKTAIL ~~LOC~~
30.0000 mL | Freq: Three times a day (TID) | ORAL | Status: DC | PRN
  Administered 2014-03-06: 30 mL via ORAL
  Filled 2014-03-06: qty 30

## 2014-03-06 NOTE — Discharge Summary (Signed)
Physician Discharge Summary  Patient ID: Janet Steele MRN: [id number] DOB/AGE: 1929/10/17 80 y.o.  Admit date: 03/02/2014 Discharge date: 03/06/2014  Admission Diagnoses: NSTEMI  Discharge Diagnoses:  Active Problems:   Chest pain   Nonspecific abnormal electrocardiogram (ECG) (EKG)   NSTEMI (non-ST elevated myocardial infarction)   Discharged Condition: stable  Hospital Course: The patient is 80 y/o female, followed by Dr. Burt Knack, with CAD, s/p CABG (4v CABG in 1982 s/p re-do 3v CABG in 2003, last nuclear stress test in 2011 demonstrated mixed ischemia and infarction in the anterolateral and inferolateral walls, stable from 2006), s/p dual chamber PPM (07/06/12) for symptomatic bradycardia, DM, HTN, HLD, PVD, Tourettes, alzheimer's dementia who presented to [facility] on 03/02/14 with typical chest pain, consistent with her prior MIs.  Initial EKG demonstrated inferolateral ST and T wave abnormalities. Cardiac enzymes were cycled and returned positive x 2 at 0.46 and 0.93. A 2D echo demonstrated an EF of 50-55%, hypokinesis of the septal wall and grade I diastolic dysfunction.  IV heparin was initiated an her pain resolved. Initially, she was reluctant to pursue a LHC, however due to recurrent CP, she decided to proceed. The LHC was performed by Dr. Tamala Julian. Angiographic findings are as follows:  1. Total occlusion of LAD, circumflex, and RCA. The LAD occlusion is mid vessel and there is a new 99% proximal stenosis. The vascular territory supplied between the 2 stenoses in the LAD is relatively small. This may be a source of some of the patient's angina.  2. Total occlusion of the saphenous vein graft to the circumflex; total occlusion of the saphenous vein graft to the diagonal/LAD; and widely patent saphenous vein graft to the PDA.  3. The saphenous vein graft to the PDA as mentioned above is widely patent and there is progression of disease in the continuation of the right coronary proximal to the  origin of 2 left ventricular branches (the continuation LV branches fill retrograde off the PDA proximal to the graft insertion site).  4. Patent LIMA to the LAD 5. Left Ventriculogram: EF of 35-40% with severe inferobasal and mid inferior wall hypokinesis  Given that there were no good targets for revascularization, aggressive medical therapy was recommended. She left the cath lab in stable condition. She was placed on high dose ASA, 325 mg, high dose Lipitor, 80 mg, 25 mg of Lopressor BID and high dose Imdur, 120 mg daily.  She had no post cath complications. She had no further recurrence of chest pain. She ambulated w/o difficulty. She was last seen and examined by Dr. Irish Lack, who determined she was stable for discharge home. She is scheduled to f/u with Dr. Burt Knack on 02/19/14.   Consults: None  Significant Diagnostic Studies:    2D Echo 03/03/14 Study Conclusions  - Left ventricle: The cavity size was normal. Systolic function was normal. The estimated ejection fraction was in the range of 50% to 55%. Hypokinesis of septal wall (post CABG). Doppler parameters are consistent with abnormal left ventricular relaxation (grade 1 diastolic dysfunction). - Left atrium: The atrium was mildly dilated. Impressions:  - When compared to prior, EF is mildly reduced.   LHC 03/05/14 Angiographic data listed above   Treatments: See Hospital Course  Discharge Exam: Blood pressure 105/51, pulse 66, temperature 98.6 F (37 C), temperature source Oral, resp. rate 24, height 5\' 1"  (1.549 m), weight 136 lb 11 oz (62 kg), SpO2 96.00%.   Disposition: 01-Home or Self Care      Discharge Orders  Future Appointments Provider Department Dept Phone   03/22/2014 3:00 PM Sherren Mocha, MD [facility] [phone]   Future Orders Complete By Expires   Diet - low sodium heart healthy  As directed    Discharge instructions  As directed    Comments:     Wait until Friday 03/08/14  to resume Metformin   Increase activity slowly  As directed        Medication List    STOP taking these medications       rosuvastatin 5 MG tablet  Commonly known as:  CRESTOR  Replaced by:  atorvastatin 80 MG tablet      TAKE these medications       ALREX 0.2 % Susp  Generic drug:  loteprednol  Place 1 drop into both eyes daily as needed. For itchy eyes     aspirin 325 MG EC tablet  Take 325 mg by mouth daily.     atorvastatin 80 MG tablet  Commonly known as:  LIPITOR  Take 1 tablet (80 mg total) by mouth daily at 6 PM.     benzonatate 200 MG capsule  Commonly known as:  TESSALON  Take 1 capsule (200 mg total) by mouth 2 (two) times daily as needed for cough.     DULoxetine 60 MG capsule  Commonly known as:  CYMBALTA  Take 60 mg by mouth daily.     glimepiride 2 MG tablet  Commonly known as:  AMARYL  Take 2 mg by mouth daily before breakfast.     guanFACINE 1 MG tablet  Commonly known as:  TENEX  Take 1.5 mg by mouth at bedtime.     isosorbide mononitrate 120 MG 24 hr tablet  Commonly known as:  IMDUR  Take 1 tablet (120 mg total) by mouth daily.     levothyroxine 88 MCG tablet  Commonly known as:  SYNTHROID, LEVOTHROID  Take 88 mcg by mouth daily.     metFORMIN 500 MG tablet  Commonly known as:  GLUCOPHAGE  Take 500 mg by mouth daily with breakfast.     nitroGLYCERIN 0.4 MG SL tablet  Commonly known as:  NITROSTAT  Place 1 tablet (0.4 mg total) under the tongue every 5 (five) minutes as needed. For chest pain     pantoprazole 40 MG tablet  Commonly known as:  PROTONIX  Take 1 tablet (40 mg total) by mouth daily.     traZODone 150 MG tablet  Commonly known as:  DESYREL  Take 225 mg by mouth at bedtime.       Follow-up Information   Follow up with Sherren Mocha, MD On 03/22/2014. (3:00 pm )    Specialty:  Cardiology   Contact information:   [id number] [place] [address] [phone]      TIME SPENT ON DISCHARGE,  INCLUDING PHYSICIAN TIME: 40 MINUTES, going over meds and plan Signed: Lyda Jester 03/06/2014, 3:18 PM   I have examined the patient and reviewed assessment and plan and discussed with patient.  Agree with above as stated.  Aggressive medical therapy for several occluded bypass grafts.  No good revascularization targets.  Avis Tirone S.

## 2014-03-06 NOTE — Progress Notes (Signed)
Pt complaining of chest pain the same as the previous time, again associated with eating. Pt refused maalox again. Pt stated she wanted SL NTG. SL NTG given. Pt states her pain is going away. Notified Dr. Colon Flattery, new order for protonix ordered. VSS. Will continue to monitor closely.

## 2014-03-06 NOTE — Progress Notes (Signed)
CARDIAC REHAB PHASE I   PRE:  Rate/Rhythm: 61 SR  BP:  Supine: 107/53  Sitting:   Standing:    SaO2: 93 RA  MODE:  Ambulation: 350 ft   POST:  Rate/Rhythm: 93 R  BP:  Supine:   Sitting: 115/57   Standing:    SaO2:  1035-1115  On arrival pt in bed, explained to her that I was here to get her out of bed and walk. She stated that she walked ion room and was OOB in chair yesterday and it caused her to have pain. "I am not getting up." I was able to convince pt that she was going to have to get OOB and walk to see if she did have cp. Assisted X 1 with hand held assist to ambulate. Gait steady, slow pace. Pt disoriented to time, some comments confused. Pt was able to walk 350 feet without c/o of cp or SOB.VS stable Pt to recliner after walk with call light in reach.  Rodney Langton RN 03/06/2014 11:17 AM

## 2014-03-06 NOTE — Progress Notes (Signed)
SUBJECTIVE: Heparin turned off at 10 PM.  More chest pain at 2AM and 8AM.  OBJECTIVE:   Vitals:   Filed Vitals:   03/06/14 0500 03/06/14 0624 03/06/14 0737 03/06/14 1227  BP: 169/61 134/56 136/55 105/51  Pulse: 64 67 66   Temp:   98 F (36.7 C) 98.6 F (37 C)  TempSrc:   Oral Oral  Resp: 26 22 14 24   Height:      Weight:      SpO2: 96% 97% 92% 96%   I&O's:    Intake/Output Summary (Last 24 hours) at 03/06/14 1422 Last data filed at 03/06/14 0600  Gross per 24 hour  Intake    703 ml  Output   1100 ml  Net   -397 ml   TELEMETRY: Reviewed telemetry pt in normal sinus rhythm:     PHYSICAL EXAM General: Well developed, well nourished, in no acute distress Head: Normal cephalic and atramatic  Lungs:  Clear bilaterally to auscultation and percussion. Heart:  HRRR S1 S2 No JVD.  No abdominal bruits. No femoral bruits. Abdomen: abdomen soft and non-tender Msk:  Back normal, normal gait. Normal strength and tone for age. Extremities:  No edema.  No right groin hematoma.  Palpable PT pulse Neuro: Alert  Psych:  Normal affect, responds appropriately   LABS: Basic Metabolic Panel: No results found for this basename: NA, K, CL, CO2, GLUCOSE, BUN, CREATININE, CALCIUM, MG, PHOS,  in the last 72 hours Liver Function Tests: No results found for this basename: AST, ALT, ALKPHOS, BILITOT, PROT, ALBUMIN,  in the last 72 hours No results found for this basename: LIPASE, AMYLASE,  in the last 72 hours CBC:  Recent Labs  03/04/14 0340 03/05/14 0350  WBC 6.2 5.6  HGB 13.1 13.8  HCT 37.9 40.3  MCV 85.2 84.5  PLT 225 226   Cardiac Enzymes: No results found for this basename: CKTOTAL, CKMB, CKMBINDEX, TROPONINI,  in the last 72 hours BNP: No components found with this basename: POCBNP,  D-Dimer: No results found for this basename: DDIMER,  in the last 72 hours Hemoglobin A1C: No results found for this basename: HGBA1C,  in the last 72 hours Fasting Lipid Panel: No results  found for this basename: CHOL, HDL, LDLCALC, TRIG, CHOLHDL, LDLDIRECT,  in the last 72 hours Thyroid Function Tests: No results found for this basename: TSH, T4TOTAL, FREET3, T3FREE, THYROIDAB,  in the last 72 hours Anemia Panel: No results found for this basename: VITAMINB12, FOLATE, FERRITIN, TIBC, IRON, RETICCTPCT,  in the last 72 hours Coag Panel:   Lab Results  Component Value Date   INR 1.06 03/03/2014    RADIOLOGY: Dg Chest 2 View  03/02/2014   CLINICAL DATA:  Chest pain.  Left arm weakness.  EXAM: CHEST  2 VIEW  COMPARISON:  11/01/2013  FINDINGS: Insert COPD left pacer is unchanged. Prior CABG. Scarring in the left base/lingula, unchanged. No acute opacities. No effusions. No acute bony abnormality.  IMPRESSION: No acute cardiopulmonary disease.  COPD/ chronic changes.   Electronically Signed   By: Rolm Baptise M.D.   On: 03/02/2014 18:59      ASSESSMENT: NSTEMI  PLAN:  Known coronary artery disease with bypass surgery many years ago followed by redo. She had a cardiac cath showing no good targets for revascualrization. Medical therapy recommended.  She walked without chest pain.  SHe had pain earlier relieved with a GI cocktail.   Continue atorvastatin given CAD. LDL target 70.  Echocardiogram revealed preserved LV  systolic function.  No signs of heart failure despite MI.  OK for d/c.  Patient wants to go home.  Jettie Booze., MD  03/06/2014  2:22 PM

## 2014-03-06 NOTE — Progress Notes (Signed)
Pt complaining of the same chest pain that brought her in, however it was not as severe, after eating a cracker. This RN questioned if it was indigestion or chest pain. The pt stated she didn't know. This RN offered maalox, pt declined and stated she wanted SL NTG. 1 SL NTG given and patient stated she was pain free.

## 2014-03-07 ENCOUNTER — Telehealth: Payer: Self-pay | Admitting: *Deleted

## 2014-03-07 NOTE — Telephone Encounter (Signed)
   The nitroglycerin comes in 25 pill bottles. It can be refilled x2. This is how my prescription is written  If the nitroglycerin is needed frequently; the cardiologist needs to know & other formulations of the nitroglycerin need to be considered

## 2014-03-07 NOTE — Telephone Encounter (Signed)
pts husband called requesting #100 Nitrostat.  States he would like to have multiple bottles to have on hand at one time.  Please advise

## 2014-03-11 NOTE — Telephone Encounter (Signed)
Spoke with pts husband.  He said he spoke with Dr Linna Darner about this.

## 2014-03-13 ENCOUNTER — Other Ambulatory Visit: Payer: Self-pay | Admitting: Internal Medicine

## 2014-03-13 ENCOUNTER — Telehealth: Payer: Self-pay | Admitting: *Deleted

## 2014-03-13 DIAGNOSIS — I214 Non-ST elevation (NSTEMI) myocardial infarction: Secondary | ICD-10-CM

## 2014-03-13 NOTE — Telephone Encounter (Signed)
pts husband called states pt was to be referred to [facility] Exercise program.  Requesting status.

## 2014-03-15 ENCOUNTER — Telehealth: Payer: Self-pay | Admitting: Cardiovascular Disease

## 2014-03-15 NOTE — Telephone Encounter (Signed)
Patient is completely out of metformin. Please call into [facility] ( [address]) [phone]

## 2014-03-18 ENCOUNTER — Telehealth: Payer: Self-pay

## 2014-03-18 MED ORDER — METFORMIN HCL 500 MG PO TABS: 500.0000 mg | ORAL_TABLET | Freq: Every day | ORAL | Status: DC

## 2014-03-18 NOTE — Telephone Encounter (Signed)
The patient called and is hoping to get a new rx for Metformin   Pharmacy - [facility] on [address] [phone]

## 2014-03-22 ENCOUNTER — Ambulatory Visit (INDEPENDENT_AMBULATORY_CARE_PROVIDER_SITE_OTHER): Payer: Medicare Other | Admitting: Cardiovascular Disease

## 2014-03-22 ENCOUNTER — Encounter: Payer: Self-pay | Admitting: Cardiovascular Disease

## 2014-03-22 VITALS — BP 116/76 | HR 80 | Ht 61.0 in | Wt 132.8 lb

## 2014-03-22 DIAGNOSIS — I25810 Atherosclerosis of coronary artery bypass graft(s) without angina pectoris: Secondary | ICD-10-CM

## 2014-03-22 DIAGNOSIS — E782 Mixed hyperlipidemia: Secondary | ICD-10-CM | POA: Diagnosis not present

## 2014-03-22 NOTE — Progress Notes (Signed)
HPI:  80 year old woman presenting for followup evaluation. She has a history of extensive CAD status post redo CABG, initially in 1982 and most recent surgery in 2003. She presented 2 weeks ago with acute coronary syndrome. She ruled in for a small non-ST elevation MI. Chills melena underwent cardiac catheterization demonstrating occlusion of her native coronary arteries with continued patency of the LIMA to LAD and saphenous vein graft to PDA. There is diffuse stenosis between the PDA and the 2 LV branches of the distal right coronary artery. There were collaterals supply the left circumflex distribution. There were no targets amenable to revascularization. The patient statin drug was changed to atorvastatin 80 mg daily. Her medical therapy was continued. She returns today for hospital followup evaluation.  The patient has had no further anginal symptoms. She denies chest pain, chest pressure, or shortness of breath. She's been concerned that she has some blockages and wonders about her prognosis with this. She denies edema or palpitations.  Outpatient Encounter Prescriptions as of 03/22/2014  Medication Sig  . aspirin 325 MG EC tablet Take 325 mg by mouth daily.    Marland Kitchen atorvastatin (LIPITOR) 80 MG tablet Take 1 tablet (80 mg total) by mouth daily at 6 PM.  . benzonatate (TESSALON) 200 MG capsule Take 1 capsule (200 mg total) by mouth 2 (two) times daily as needed for cough.  . DULoxetine (CYMBALTA) 60 MG capsule Take 60 mg by mouth daily.  Marland Kitchen glimepiride (AMARYL) 2 MG tablet Take 2 mg by mouth daily before breakfast.   . guanFACINE (TENEX) 1 MG tablet Take 1.5 mg by mouth at bedtime.  . isosorbide mononitrate (IMDUR) 120 MG 24 hr tablet Take 1 tablet (120 mg total) by mouth daily.  Marland Kitchen levothyroxine (SYNTHROID, LEVOTHROID) 88 MCG tablet Take 88 mcg by mouth daily.  . metFORMIN (GLUCOPHAGE) 500 MG tablet Take 1 tablet (500 mg total) by mouth daily with breakfast.  . nitroGLYCERIN (NITROSTAT) 0.4 MG  SL tablet Place 1 tablet (0.4 mg total) under the tongue every 5 (five) minutes as needed. For chest pain  . pantoprazole (PROTONIX) 40 MG tablet Take 1 tablet (40 mg total) by mouth daily.  . [DISCONTINUED] loteprednol (ALREX) 0.2 % SUSP Place 1 drop into both eyes daily as needed. For itchy eyes  . [DISCONTINUED] traZODone (DESYREL) 150 MG tablet Take 225 mg by mouth at bedtime.    Allergies  Allergen Reactions  . Prednisone Other (See Comments)    MEMORY LOSS   . Codeine Rash  . Morphine Hives, Itching and Rash  . Penicillins Itching and Rash  . Sulfonamide Derivatives Itching and Rash  . Hydrocodone-Acetaminophen Other (See Comments)    unknown  . Meperidine Hcl Itching  . Metformin Other (See Comments)    nervous  . Metoclopramide Hcl Itching and Rash  . Metoprolol Succinate Other (See Comments)    nervous  . Moxifloxacin Itching  . Oxycodone-Aspirin Rash  . Pentazocine Lactate Nausea Only  . Pioglitazone Other (See Comments)    bloating  . Tramadol Hcl Other (See Comments)    unknown    Past Medical History  Diagnosis Date  . Hypothyroidism   . Hyperlipidemia   . GERD (gastroesophageal reflux disease)   . Anxiety   . Diabetes mellitus   . Hypertension   . PVD (peripheral vascular disease)   . Carotid bruit   . Other dysphagia   . Other esophagitis   . Hemorrhoid   . Other constipation   . Colon,  diverticulosis   . Hiatal hernia   . Osteoarthrosis, unspecified whether generalized or localized, unspecified site   . Degenerative joint disease   . Esophageal stricture   . Unspecified hearing loss   . Diastolic dysfunction   . CAD (coronary artery disease)   . Shingles   . Nonspecific abnormal electrocardiogram (ECG) (EKG)   . Nonspecific abnormal electrocardiogram (ECG) (EKG)    ROS: Negative except as per HPI  BP 116/76  Pulse 80  Ht 5\' 1"  (1.549 m)  Wt 132 lb 12.8 oz (60.238 kg)  BMI 25.11 kg/m2  PHYSICAL EXAM: Pt is alert and oriented, pleasant  elderly woman in NAD HEENT: normal, involuntary tic noted Neck: JVP - normal, carotids 2+= without bruits Lungs: CTA bilaterally CV: RRR without murmur or gallop Abd: soft, NT, Positive BS, no hepatomegaly Ext: no C/C/E, distal pulses intact and equal Skin: warm/dry no rash  2-D echocardiogram 03/03/2014: Study Conclusions  - Left ventricle: The cavity size was normal. Systolic function was normal. The estimated ejection fraction was in the range of 50% to 55%. Hypokinesis of septal wall (post CABG). Doppler parameters are consistent with abnormal left ventricular relaxation (grade 1 diastolic dysfunction). - Left atrium: The atrium was mildly dilated. Impressions:  - When compared to prior, EF is mildly reduced.  ASSESSMENT AND PLAN: 1. Coronary artery disease, native vessel and bypass graft disease. Catheterization results noted. The patient appears to be stable on her current medical program which includes aspirin, isosorbide, and atorvastatin. She has multiple allergies and these include beta blockers. I will see her back in 3 months.  2. Hyperlipidemia. Statin drug was changed to atorvastatin 80 mg. Recommend repeat lipids and LFTs in 2 months.  Sherren Mocha 03/22/2014 3:16 PM

## 2014-03-22 NOTE — Patient Instructions (Signed)
Your physician recommends that you return for a FASTING LIPID and LIVER in 2 MONTHS--nothing to eat or drink after midnight, lab opens at 7:30 am  Your physician wants you to follow-up in: 3 MONTHS with Dr Burt Knack.  You will receive a reminder letter in the mail two months in advance. If you don't receive a letter, please call our office to schedule the follow-up appointment.  Your physician recommends that you continue on your current medications as directed. Please refer to the Current Medication list given to you today.

## 2014-04-01 DIAGNOSIS — Z85828 Personal history of other malignant neoplasm of skin: Secondary | ICD-10-CM | POA: Diagnosis not present

## 2014-04-03 ENCOUNTER — Telehealth: Payer: Self-pay | Admitting: Cardiovascular Disease

## 2014-04-03 MED ORDER — ISOSORBIDE MONONITRATE ER 60 MG PO TB24: 60.0000 mg | ORAL_TABLET | Freq: Every day | ORAL | Status: DC

## 2014-04-03 NOTE — Telephone Encounter (Signed)
New problem    Pt needs a call back about her medication.  Pt feels sick after taking them.     Thanks.

## 2014-04-03 NOTE — Telephone Encounter (Signed)
I spoke with the pt and she complains of a headache after taking her medications and her head just does not feel right. The pt feels like 3 of her medications (Metformin, Isosorbide MN or Pantoprazole) could be causing symptoms. I made the pt aware that Isosorbide MN can cause a headache.  I made her aware of the reason for this medication and she said that she does not have angina.  The pt recently went into the hospital with CP and an MI.  Medical therapy was recommended after cardiac catheterization. I instructed the pt to decrease Isosorbide MN to 60mg  daily and call the office in 1-2 weeks if her symptoms continue.  Pt agreed with plan.  I will forward this message to Dr Burt Knack for review.

## 2014-04-04 NOTE — Telephone Encounter (Signed)
Agree with plan. May need to reduce isosorbide further if sx's persist. thx

## 2014-04-15 DIAGNOSIS — IMO0002 Reserved for concepts with insufficient information to code with codable children: Secondary | ICD-10-CM | POA: Diagnosis not present

## 2014-04-15 DIAGNOSIS — Z85828 Personal history of other malignant neoplasm of skin: Secondary | ICD-10-CM | POA: Diagnosis not present

## 2014-04-24 ENCOUNTER — Encounter: Payer: Self-pay | Admitting: Internal Medicine

## 2014-04-24 ENCOUNTER — Ambulatory Visit (INDEPENDENT_AMBULATORY_CARE_PROVIDER_SITE_OTHER): Payer: Medicare Other | Admitting: Internal Medicine

## 2014-04-24 VITALS — BP 110/70 | HR 86 | Temp 98.2°F | Wt 128.8 lb

## 2014-04-24 DIAGNOSIS — Z950 Presence of cardiac pacemaker: Secondary | ICD-10-CM

## 2014-04-24 DIAGNOSIS — I214 Non-ST elevation (NSTEMI) myocardial infarction: Secondary | ICD-10-CM

## 2014-04-24 DIAGNOSIS — I25810 Atherosclerosis of coronary artery bypass graft(s) without angina pectoris: Secondary | ICD-10-CM

## 2014-04-24 DIAGNOSIS — G479 Sleep disorder, unspecified: Secondary | ICD-10-CM | POA: Diagnosis not present

## 2014-04-24 DIAGNOSIS — I10 Essential (primary) hypertension: Secondary | ICD-10-CM | POA: Diagnosis not present

## 2014-04-24 MED ORDER — LOSARTAN POTASSIUM 50 MG PO TABS: 50.0000 mg | ORAL_TABLET | Freq: Every day | ORAL | Status: DC

## 2014-04-24 NOTE — Progress Notes (Signed)
Pre visit review using our clinic review tool, if applicable. No additional management support is needed unless otherwise documented below in the visit note. 

## 2014-04-24 NOTE — Assessment & Plan Note (Signed)
   She is insistent that I give her sleep medicines. I've given her -2 options. The first option is that she see Dr Beacher May, the neuro sleep specialist. Second option is that she see the physician of one of her friends who apparently prescribes trazodone.  Because of its black box warning; prescribing trazodone by me  is not an option based on Beer's studies.

## 2014-04-24 NOTE — Patient Instructions (Signed)
To prevent sleep dysfunction follow these instructions for sleep hygiene. Do not read, watch TV, or eat in bed. Do not get into bed until you are ready to turn off the light &  to go to sleep. Do not ingest stimulants ( decongestants, diet pills, nicotine, caffeine) after the evening meal.Do not take daytime naps.Cardiovascular exercise, this can be as simple a program as walking, is recommended 30-45 minutes 3-4 times per week. If you're not exercising you should take 6-8 weeks to build up to this level. The Sleep Medicine  referral will be scheduled and you'll be notified of the time.

## 2014-04-24 NOTE — Progress Notes (Signed)
   Subjective:    Patient ID: Janet Steele, female    DOB: 10-Jul-1929, 80 y.o.   MRN: [id number]  HPI   Her hospital course related to the non-STEMI was reviewed & Problem List updated I had received communication from her pharmacy that generic Tenex was  contraindicated as it is high risk due to its centrally acting antihypertensive effect.  She is here in attempt to have her trazodone renewed for her chronic sleep disorder. The risks associated with this medicine were discussed with her as has been done on @ least 2 occasions in past.  Her family states that she has been much more alert and communicative since this was discontinued. They said prior to that she was sleeping during the day and was profoundly difficult to arouse.  She is adamant that this is only thing that helps her sleep. She keeps repeating that "many of my friends are on this ,so it must be safe".    Review of Systems  Since discharge she is not having angina symptoms, shortness of breath, edema, paroxysmal nocturnal dyspnea.       Objective:   Physical Exam  She is alert and very agitated as she talks about the trazodone.  She has intermittent brisk lateral jerking of her head.  There is no scleral icterus or conjunctivitis  She has no lymphadenopathy about the neck or axilla  Thyroid normal to palpation  An S4 is noted with no murmurs or gallops  Minor rales are noted without increased work of breathing  Abdomen is protuberant.  There is a wound over the mid abdomen which has been dressed (Dermatologist being seen)  Dorsalis pedis pulses are decreased  There is dependent rubor of the feet which reverses with elevation        Assessment & Plan:  #1 high risk medications include Tenex and the trazodone. This was explained to her. She accepted this information readily about Tenex but not trazodone.She remains adamant that this is critical for her to sleep  Plan: I discussed the options I am  willing to pursue with her. The first option would be assessment by sleep specialist for their opinion about the trazodone. The other option would be for her to seek a second opinion from the physician of one of her friends who has no issue with the trazodone  The Tenex will be discontinued and losartan initiated at low dose.

## 2014-04-24 NOTE — Assessment & Plan Note (Signed)
Change Tenex to Losartan  Due to documented risk of this centrally acting agent.  Metoprolol is not an option as it is on her list of allergies/intolerances.

## 2014-05-03 ENCOUNTER — Encounter: Payer: Self-pay | Admitting: Neurology

## 2014-05-03 ENCOUNTER — Ambulatory Visit (INDEPENDENT_AMBULATORY_CARE_PROVIDER_SITE_OTHER): Payer: Medicare Other | Admitting: Neurology

## 2014-05-03 VITALS — BP 116/69 | HR 62 | Resp 17 | Ht 60.75 in | Wt 132.0 lb

## 2014-05-03 DIAGNOSIS — F19982 Other psychoactive substance use, unspecified with psychoactive substance-induced sleep disorder: Secondary | ICD-10-CM

## 2014-05-03 DIAGNOSIS — I25810 Atherosclerosis of coronary artery bypass graft(s) without angina pectoris: Secondary | ICD-10-CM

## 2014-05-03 MED ORDER — TRAZODONE HCL 50 MG PO TABS: 50.0000 mg | ORAL_TABLET | Freq: Every evening | ORAL | Status: DC | PRN

## 2014-05-03 MED ORDER — TRAZODONE HCL 50 MG PO TABS: 50.0000 mg | ORAL_TABLET | Freq: Every day | ORAL | Status: DC

## 2014-05-03 MED ORDER — SUVOREXANT 15 MG PO TABS: 15.0000 mg | ORAL_TABLET | Freq: Every evening | ORAL | Status: DC

## 2014-05-03 NOTE — Patient Instructions (Signed)

## 2014-05-03 NOTE — Addendum Note (Signed)
Addended by: Larey Seat on: 05/03/2014 01:54 PM   Modules accepted: Orders

## 2014-05-03 NOTE — Progress Notes (Addendum)
[facility]  Provider:  Larey Seat, M D  Referring Provider: Hendricks Limes, MD Primary Care Physician:  Janet Cobble, MD  Chief Complaint  Patient presents with  . New Evaluation    Room 10  . Sleep consult    HPI:  Janet Steele is a 80 y.o. caucasian, right handed, married  female , who is seen here as a referral  from Dr. Linna Steele for a sleep evaluation,   This patient is followed by Dr. Burt Steele for cardiology, and Dr.Hopper for internal medicine. Janet Steele has been married for over 65 years and lives with her husband. She has former [employer] volunteer- 4 years of college. She states that she has been a lifelong insomniac.  She was a child and teenager with insomnia, missed school due to sleep deprivation.  She has a nervous tick since age 53 , and [facility] With her previous physicians she had tried already many as sleep aids with very little success. The patient was hospitalized for her cardiac disease when she was first placed on Ambien but is many years ago.  She successfully discontinued the use of Ambien, tried amitriptyline,  but found of no other medication for a long time that would help her to initiate sleep. Finally, he tried trazodone and she slept well . Dr Janet Steele felt not comfortable prescribing the medication for her.  Since than she takes Unisom, OTC , 2-3 a night and feels daytime drowsy. She has a very dry mouth since she takes antihistamines, She begun to develop restless legs. She is hot at night and irritable in daytime. She feels exhausted , and a  little angry at her physicians. She had her first heart attack in 1977 while playing tennis, than another in 1983 and followed by a CABG , 2003 another surgery and pacemaker implanted in 2013. No history of CVA or TIA.   I explained sleep hygiene, sleep routines to her and she seems to have known it all, but not all was implemented.   She goes to the  bedroom 11 PM and it takes very long for her to fall asleep. tossing and turning, reads when she cannot sleep. Sometimes watch TV in the bedroom.  She rises about 9 or 11 AM , that is variable , depending on how she slept. She has only 1-4 hours of sleep by her perception. She has trouble to attend early morning appointments , and this was interpreted to reflect sleep aid " hang over".  She does not nap in daytime. Feels hypervigilant. Bedroom is cool, quiet, dark. No pets in the bed. No pain interrupting  Sleep, nocturia.  She doesn't drink caffeine but than stated she drinks decaffeinated ice tea- 3-4 glasses  , one glass of red wine at dinner -her only  ETOH, non smoker . She has a stable BMI for years, no facial or neck surgeries or traumas.  We discussed several medications and strategies for insomnia treatment as well as a sleep work up for other causes.     Review of Systems: Out of a complete 14 system review, the patient complains of only the following symptoms, and all other reviewed systems are negative. Irritable, insomnia. Dry dry mouth .  Epworth was 3 and FSS was 27, GDS one point.    History   Social History  . Marital Status: Married    Spouse Name: [name]    Number of Children: 3  .  Years of Education: 16   Occupational History  . Retired    .     Social History Main Topics  . Smoking status: Former Smoker    Types: Cigarettes    Quit date: 07/05/1976  . Smokeless tobacco: Never Used     Comment: smoked age18- 1977, up to 1/2 ppd  . Alcohol Use: 2.4 oz/week    4 Glasses of wine per week  . Drug Use: No  . Sexual Activity: No   Other Topics Concern  . Not on file   Social History Narrative   Patient is married [name]) and lives at home with her husband.   Patient has three adult children.   Patient is retired.   Patient has a college education.   Patient is right-handed.   Patient does not drink any caffeine.    Family History  Problem Relation Age  of Onset  . Heart attack Father   . Heart disease Sister   . Colon cancer Neg Hx     Past Medical History  Diagnosis Date  . Hypothyroidism   . Hyperlipidemia   . GERD (gastroesophageal reflux disease)   . Anxiety   . Diabetes mellitus   . Hypertension   . PVD (peripheral vascular disease)   . Carotid bruit   . Other dysphagia   . Other esophagitis   . Hemorrhoid   . Other constipation   . Colon, diverticulosis   . Hiatal hernia   . Osteoarthrosis, unspecified whether generalized or localized, unspecified site   . Degenerative joint disease   . Esophageal stricture   . Unspecified hearing loss   . Diastolic dysfunction   . CAD (coronary artery disease)   . Shingles   . Nonspecific abnormal electrocardiogram (ECG) (EKG)   . Nonspecific abnormal electrocardiogram (ECG) (EKG)     Past Surgical History  Procedure Laterality Date  . Appendectomy    . Cholecystectomy    . Abdominal hysterectomy    . Oophorectomy      bilateral  . Coronary artery bypass graft      x4(1982),02-2002 CABG X2  . Back surgery  01-2000  . Pacemaker insertion  07/06/12    MDT Adapta L implanted by Dr Rayann Heman for symptomatic bradycardia    Current Outpatient Prescriptions  Medication Sig Dispense Refill  . aspirin 325 MG EC tablet Take 325 mg by mouth daily.        Marland Kitchen atorvastatin (LIPITOR) 80 MG tablet Take 1 tablet (80 mg total) by mouth daily at 6 PM.  30 tablet  5  . benzonatate (TESSALON) 200 MG capsule Take 1 capsule (200 mg total) by mouth 2 (two) times daily as needed for cough.  20 capsule  0  . DULoxetine (CYMBALTA) 60 MG capsule Take 60 mg by mouth daily.      Marland Kitchen glimepiride (AMARYL) 2 MG tablet Take 2 mg by mouth daily before breakfast.       . isosorbide mononitrate (IMDUR) 60 MG 24 hr tablet Take 1 tablet (60 mg total) by mouth daily.  30 tablet  5  . levothyroxine (SYNTHROID, LEVOTHROID) 88 MCG tablet Take 88 mcg by mouth daily.      Marland Kitchen losartan (COZAAR) 50 MG tablet Take 1 tablet (50  mg total) by mouth daily.  30 tablet  5  . metFORMIN (GLUCOPHAGE) 500 MG tablet Take 1 tablet (500 mg total) by mouth daily with breakfast.  90 tablet  3  . nitroGLYCERIN (NITROSTAT) 0.4 MG SL tablet  Place 1 tablet (0.4 mg total) under the tongue every 5 (five) minutes as needed. For chest pain  25 tablet  5  . pantoprazole (PROTONIX) 40 MG tablet Take 1 tablet (40 mg total) by mouth daily.  30 tablet  5  . traMADol (ULTRAM) 50 MG tablet        No current facility-administered medications for this visit.    Allergies as of 05/03/2014 - Review Complete 05/03/2014  Allergen Reaction Noted  . Prednisone Other (See Comments) 04/05/2013  . Codeine Rash 12/03/2009  . Morphine Hives, Itching, and Rash 12/03/2009  . Penicillins Itching and Rash 12/10/2010  . Sulfonamide derivatives Itching and Rash 12/03/2009  . Hydrocodone-acetaminophen Other (See Comments) 01/04/2008  . Meperidine hcl Itching 12/03/2009  . Metformin Other (See Comments)   . Metoclopramide hcl Itching and Rash 12/03/2009  . Metoprolol succinate Other (See Comments)   . Moxifloxacin Itching 12/03/2009  . Oxycodone-aspirin Rash 12/03/2009  . Pentazocine lactate Nausea Only 12/03/2009  . Pioglitazone Other (See Comments) 01/04/2008  . Tramadol hcl Other (See Comments) 12/03/2009    Vitals: BP 116/69  Pulse 62  Resp 17  Ht 5' 0.75" (1.543 m)  Wt 132 lb (59.875 kg)  BMI 25.15 kg/m2 Last Weight:  Wt Readings from Last 1 Encounters:  05/03/14 132 lb (59.875 kg)   Last Height:   Ht Readings from Last 1 Encounters:  05/03/14 5' 0.75" (1.543 m)    Physical exam:  General: The patient is awake, alert and appears not in acute distress. The patient is well groomed. Head: Normocephalic, atraumatic. Neck is supple. Mallampati 2 , neck circumference: 14 inches , dry mouth, she appears to constantly chew.  Cardiovascular:  Regular rate and rhythm , without  murmurs or carotid bruit, and without distended neck  veins. Respiratory: Lungs are clear to auscultation. Skin:  Without evidence of edema, or rash Trunk: BMI is normal posture.  Neurologic exam : The patient is awake and alert, oriented to place and time.  Memory subjective described as intact.  There is a reduced  attention span & concentration ability.  Speech is fluent without dysarthria, dysphonia or aphasia. Mood and affect - she appears impulsive.  Cranial nerves: Pupils are equal and briskly reactive to light. Funduscopic exam without   evidence of pallor or edema.  Extraocular movements in vertical and horizontal planes intact and without nystagmus. Visual fields by finger perimetry are intact. Hearing impaired .  Facial sensation intact to fine touch. Facial motor strength is symmetric and tongue and uvula move midline.  Motor exam:  Normal tone and normal muscle bulk and symmetric normal strength in all extremities.  Sensory:  Fine touch, pinprick and vibration were tested in all extremities. Proprioception is  normal.  Coordination: Rapid alternating movements in the fingers/hands is tested and normal. Finger-to-nose maneuver tested  without evidence of ataxia, dysmetria or tremor.  Gait and station: Patient walks without assistive device ,climbs up to the exam table.  She is restless, Strength within normal limits. Stance is stable and normal. Deep tendon reflexes: in the  upper and lower extremities are symmetric and intact. Babinski maneuver response is  downgoing.   Assessment:  After physical and neurologic examination, review of laboratory studies, imaging, neurophysiology testing and pre-existing records, assessment is  1) patient with chronic insomnia and long term dependence of sleep aids. The least addictive and not EKG changing medication would be TRAZODONE.  She was never evaluated for sleep disorders, there is evidence of snoring ,  dry mouth.  Epworth was 3 and FSS was 27, GDS one point.   Plan:  Treatment plan and  additional workup :  PSG ordered. Trazodone resumed , she used to take 125 mg, which is a lot . I  will  Try belsomra  1 mg samples.  If no success, use 50 mg trazodone  to start with.  60 minutes visit with sleep education and sleep hygiene education. Questions answered.

## 2014-05-07 DIAGNOSIS — IMO0002 Reserved for concepts with insufficient information to code with codable children: Secondary | ICD-10-CM | POA: Diagnosis not present

## 2014-05-07 DIAGNOSIS — Z85828 Personal history of other malignant neoplasm of skin: Secondary | ICD-10-CM | POA: Diagnosis not present

## 2014-05-15 ENCOUNTER — Other Ambulatory Visit (INDEPENDENT_AMBULATORY_CARE_PROVIDER_SITE_OTHER): Payer: Medicare Other

## 2014-05-15 DIAGNOSIS — E782 Mixed hyperlipidemia: Secondary | ICD-10-CM

## 2014-05-15 DIAGNOSIS — I25810 Atherosclerosis of coronary artery bypass graft(s) without angina pectoris: Secondary | ICD-10-CM | POA: Diagnosis not present

## 2014-05-15 LAB — HEPATIC FUNCTION PANEL
ALBUMIN: 4.1 g/dL (ref 3.5–5.2)
ALK PHOS: 77 U/L (ref 39–117)
ALT: 20 U/L (ref 0–35)
AST: 21 U/L (ref 0–37)
Bilirubin, Direct: 0 mg/dL (ref 0.0–0.3)
TOTAL PROTEIN: 7 g/dL (ref 6.0–8.3)
Total Bilirubin: 1.1 mg/dL (ref 0.2–1.2)

## 2014-05-15 LAB — LIPID PANEL
CHOL/HDL RATIO: 6
Cholesterol: 212 mg/dL — ABNORMAL HIGH (ref 0–200)
HDL: 32.8 mg/dL — AB (ref >39.00)
LDL Cholesterol: 143 mg/dL — ABNORMAL HIGH (ref 0–99)
NonHDL: 179.2
Triglycerides: 182 mg/dL — ABNORMAL HIGH (ref 0.0–149.0)
VLDL: 36.4 mg/dL (ref 0.0–40.0)

## 2014-05-22 ENCOUNTER — Other Ambulatory Visit: Payer: Medicare Other

## 2014-06-24 ENCOUNTER — Other Ambulatory Visit: Payer: Self-pay | Admitting: Neurology

## 2014-06-24 DIAGNOSIS — Z85828 Personal history of other malignant neoplasm of skin: Secondary | ICD-10-CM | POA: Diagnosis not present

## 2014-06-24 DIAGNOSIS — L299 Pruritus, unspecified: Secondary | ICD-10-CM | POA: Diagnosis not present

## 2014-06-24 DIAGNOSIS — L259 Unspecified contact dermatitis, unspecified cause: Secondary | ICD-10-CM | POA: Diagnosis not present

## 2014-06-26 ENCOUNTER — Other Ambulatory Visit: Payer: Self-pay | Admitting: Internal Medicine

## 2014-06-26 NOTE — Telephone Encounter (Signed)
#   30, R X 2

## 2014-07-02 ENCOUNTER — Ambulatory Visit (INDEPENDENT_AMBULATORY_CARE_PROVIDER_SITE_OTHER): Payer: Medicare Other | Admitting: Cardiovascular Disease

## 2014-07-02 ENCOUNTER — Encounter: Payer: Self-pay | Admitting: Cardiovascular Disease

## 2014-07-02 VITALS — BP 102/68 | HR 80 | Wt 130.0 lb

## 2014-07-02 DIAGNOSIS — I2510 Atherosclerotic heart disease of native coronary artery without angina pectoris: Secondary | ICD-10-CM | POA: Diagnosis not present

## 2014-07-02 DIAGNOSIS — E782 Mixed hyperlipidemia: Secondary | ICD-10-CM | POA: Diagnosis not present

## 2014-07-02 DIAGNOSIS — I25810 Atherosclerosis of coronary artery bypass graft(s) without angina pectoris: Secondary | ICD-10-CM

## 2014-07-02 NOTE — Progress Notes (Signed)
HPI:  80 year old woman presenting for followup evaluation. She has a history of extensive CAD status post redo CABG, initially in 1982 and most recent surgery in 2003. She most recently presented with non-STEMI in April of this year. She underwent cardiac catheterization demonstrating occlusion of her native coronary arteries with continued patency of the LIMA to LAD and saphenous vein graft to PDA. There is diffuse stenosis between the PDA and the 2 LV branches of the distal right coronary artery. There were collaterals supply the left circumflex distribution. There were no targets amenable to revascularization.  The patient reports no recent cardiac symptoms. She does note that she has completely intolerant to heat. She is not able to walk during the summer months. She's not been following a prudent diet. She states that she's been sedentary. She denies any recent chest pain or pressure. She has mild dyspnea with exertion and no recent change. No edema, orthopnea, PND, lightheadedness, or syncope.    Outpatient Encounter Prescriptions as of 07/02/2014  Medication Sig  . aspirin 325 MG EC tablet Take 325 mg by mouth daily.    Marland Kitchen atorvastatin (LIPITOR) 80 MG tablet Take 1 tablet (80 mg total) by mouth daily at 6 PM.  . benzonatate (TESSALON) 200 MG capsule Take 1 capsule (200 mg total) by mouth 2 (two) times daily as needed for cough.  . DULoxetine (CYMBALTA) 60 MG capsule take 1 capsule by mouth once daily  . glimepiride (AMARYL) 2 MG tablet Take 2 mg by mouth daily before breakfast.   . isosorbide mononitrate (IMDUR) 60 MG 24 hr tablet Take 1 tablet (60 mg total) by mouth daily.  Marland Kitchen levothyroxine (SYNTHROID, LEVOTHROID) 88 MCG tablet Take 88 mcg by mouth daily.  Marland Kitchen losartan (COZAAR) 50 MG tablet Take 1 tablet (50 mg total) by mouth daily.  . metFORMIN (GLUCOPHAGE) 500 MG tablet Take 1 tablet (500 mg total) by mouth daily with breakfast.  . nitroGLYCERIN (NITROSTAT) 0.4 MG SL tablet Place 1  tablet (0.4 mg total) under the tongue every 5 (five) minutes as needed. For chest pain  . pantoprazole (PROTONIX) 40 MG tablet Take 1 tablet (40 mg total) by mouth daily.  . Suvorexant (BELSOMRA) 15 MG TABS Take 15 mg by mouth Nightly.  . traZODone (DESYREL) 50 MG tablet take 1 tablet by mouth at bedtime if needed for sleep  . [DISCONTINUED] DULoxetine (CYMBALTA) 60 MG capsule Take 60 mg by mouth daily.  . [DISCONTINUED] traMADol (ULTRAM) 50 MG tablet Take 50 mg by mouth as needed.   . [DISCONTINUED] traZODone (DESYREL) 50 MG tablet Take 1 tablet (50 mg total) by mouth at bedtime as needed for sleep.    Allergies  Allergen Reactions  . Prednisone Other (See Comments)    MEMORY LOSS   . Codeine Rash  . Morphine Hives, Itching and Rash  . Penicillins Itching and Rash  . Sulfonamide Derivatives Itching and Rash  . Hydrocodone-Acetaminophen Other (See Comments)    unknown  . Meperidine Hcl Itching  . Metformin Other (See Comments)    nervous  . Metoclopramide Hcl Itching and Rash  . Metoprolol Succinate Other (See Comments)    nervous  . Moxifloxacin Itching  . Oxycodone-Aspirin Rash  . Pentazocine Lactate Nausea Only  . Pioglitazone Other (See Comments)    bloating  . Tramadol Hcl Other (See Comments)    unknown    Past Medical History  Diagnosis Date  . Hypothyroidism   . Hyperlipidemia   . GERD (gastroesophageal reflux  disease)   . Anxiety   . Diabetes mellitus   . Hypertension   . PVD (peripheral vascular disease)   . Carotid bruit   . Other dysphagia   . Other esophagitis   . Hemorrhoid   . Other constipation   . Colon, diverticulosis   . Hiatal hernia   . Osteoarthrosis, unspecified whether generalized or localized, unspecified site   . Degenerative joint disease   . Esophageal stricture   . Unspecified hearing loss   . Diastolic dysfunction   . CAD (coronary artery disease)   . Shingles   . Nonspecific abnormal electrocardiogram (ECG) (EKG)   .  Nonspecific abnormal electrocardiogram (ECG) (EKG)     ROS: Negative except as per HPI  BP 102/68  Pulse 80  Wt 130 lb (58.968 kg)  PHYSICAL EXAM: Pt is alert and oriented, NAD HEENT: normal Neck: JVP - normal, carotids 2+= without bruits Lungs: CTA bilaterally CV: RRR with soft systolic ejection murmur at the left sternal border Abd: soft, NT, Positive BS, no hepatomegaly Ext: no C/C/E, distal pulses intact and equal Skin: warm/dry no rash  ASSESSMENT AND PLAN: 1. Coronary atherosclerosis, native vessel. The patient appears stable with no recent symptoms of angina. She will continue on her current medical program.  2. Hyperlipidemia. Patient takes atorvastatin 80 mg daily. Most recent lipids were reviewed. She is not at goal. Advise major changes in lifestyle with focus on dietary improvement. Will repeat lipids in 6 months.  Sherren Mocha 07/02/2014 2:40 PM

## 2014-07-02 NOTE — Patient Instructions (Signed)
Your physician wants you to follow-up in: 6 MONTHS with Dr Burt Knack. You will receive a reminder letter in the mail two months in advance. If you don't receive a letter, please call our office to schedule the follow-up appointment.  Your physician recommends that you return for a FASTING LIPID and LIVER profile in 6 MONTHS--nothing to eat or drink after midnight.   Your physician recommends that you continue on your current medications as directed. Please refer to the Current Medication list given to you today.

## 2014-08-29 ENCOUNTER — Other Ambulatory Visit: Payer: Self-pay | Admitting: Neurological Surgery

## 2014-08-29 DIAGNOSIS — M4310 Spondylolisthesis, site unspecified: Secondary | ICD-10-CM

## 2014-09-02 ENCOUNTER — Telehealth: Payer: Self-pay | Admitting: Neurology

## 2014-09-02 NOTE — Telephone Encounter (Signed)
Spoke to patient and she is requesting an appointment with Dr. Brett Fairy for undesirable head movement and jerking, which is keeping her up.  She was previously seen for sleep evaluation.  Please advise if we need a new referral for new problem.

## 2014-09-02 NOTE — Telephone Encounter (Signed)
This is a movement disorder, not sleep- Would she like to see Dr Krista Blue or Jaynee Eagles. CD

## 2014-09-03 NOTE — Telephone Encounter (Signed)
I will forward message to Ammie for scheduling appointment since I will be out of office for a week.

## 2014-09-03 NOTE — Telephone Encounter (Signed)
Movement disorder, I sent you this message yesterday.  I will ask Dr Jaynee Eagles to see this patient.

## 2014-09-03 NOTE — Telephone Encounter (Signed)
I think maybe she meant Dr. Rexene Alberts instead of me; Dr. Rexene Alberts is specialty trained in movement disorders. But I am more than happy to see patient if dr Rexene Alberts is booked since my schedule is very open. Let's talk when I see you.

## 2014-09-03 NOTE — Telephone Encounter (Signed)
I can see pt

## 2014-09-05 NOTE — Telephone Encounter (Signed)
Patient calling again, states that she would like an appointment as soon as possible with Dr. Brett Fairy, please return call and advise.

## 2014-09-05 NOTE — Telephone Encounter (Signed)
Called to schedule appt per Dr Rexene Alberts, lt Vm message for call back

## 2014-09-05 NOTE — Telephone Encounter (Signed)
Spoke with patient and she said that she wanted to schedule her appt with Dr Brett Fairy as she had previously requested,does not wish to change

## 2014-09-05 NOTE — Telephone Encounter (Signed)
Patient received message, states that she wants to see Dr. Brett Fairy, please return call and advise.

## 2014-09-06 NOTE — Telephone Encounter (Signed)
Please put her in for a new problem visit.

## 2014-09-06 NOTE — Telephone Encounter (Signed)
Called patient appointment was scheduled on 09/10/14 at 1 pm.

## 2014-09-10 ENCOUNTER — Other Ambulatory Visit: Payer: Medicare Other

## 2014-09-10 ENCOUNTER — Ambulatory Visit: Payer: Self-pay | Admitting: Neurology

## 2014-09-12 ENCOUNTER — Encounter: Payer: Self-pay | Admitting: Neurology

## 2014-09-12 ENCOUNTER — Ambulatory Visit (INDEPENDENT_AMBULATORY_CARE_PROVIDER_SITE_OTHER): Payer: Medicare Other | Admitting: Neurology

## 2014-09-12 VITALS — BP 100/61 | HR 87 | Resp 17 | Ht 60.0 in | Wt 132.0 lb

## 2014-09-12 DIAGNOSIS — G4701 Insomnia due to medical condition: Secondary | ICD-10-CM

## 2014-09-12 DIAGNOSIS — F951 Chronic motor or vocal tic disorder: Secondary | ICD-10-CM | POA: Insufficient documentation

## 2014-09-12 DIAGNOSIS — I25810 Atherosclerosis of coronary artery bypass graft(s) without angina pectoris: Secondary | ICD-10-CM | POA: Diagnosis not present

## 2014-09-12 MED ORDER — TRAZODONE HCL 50 MG PO TABS: ORAL_TABLET | ORAL | Status: DC

## 2014-09-12 MED ORDER — SUVOREXANT 15 MG PO TABS: 15.0000 mg | ORAL_TABLET | Freq: Every evening | ORAL | Status: DC

## 2014-09-12 NOTE — Patient Instructions (Addendum)
Pure motor tic , affecting the patient at night, waking her from sleep .Trazodone tablets What is this medicine? TRAZODONE (TRAZ oh done) is used to treat depression. This medicine may be used for other purposes; ask your health care provider or pharmacist if you have questions. COMMON BRAND NAME(S): Desyrel What should I tell my health care provider before I take this medicine? They need to know if you have any of these conditions: -attempted suicide or thinking about it -bipolar disorder -bleeding problems -glaucoma -heart disease, or previous heart attack -irregular heart beat -kidney or liver disease -low levels of sodium in the blood -an unusual or allergic reaction to trazodone, other medicines, foods, dyes or preservatives -pregnant or trying to get pregnant -breast-feeding How should I use this medicine? Take this medicine by mouth with a glass of water. Follow the directions on the prescription label. Take this medicine shortly after a meal or a light snack. Take your medicine at regular intervals. Do not take your medicine more often than directed. Do not stop taking this medicine suddenly except upon the advice of your doctor. Stopping this medicine too quickly may cause serious side effects or your condition may worsen. A special MedGuide will be given to you by the pharmacist with each prescription and refill. Be sure to read this information carefully each time. Talk to your pediatrician regarding the use of this medicine in children. Special care may be needed. Overdosage: If you think you have taken too much of this medicine contact a poison control center or emergency room at once. NOTE: This medicine is only for you. Do not share this medicine with others. What if I miss a dose? If you miss a dose, take it as soon as you can. If it is almost time for your next dose, take only that dose. Do not take double or extra doses. What may interact with this medicine? Do not take  this medicine with any of the following medications: -certain medicines for fungal infections like fluconazole, itraconazole, ketoconazole, posaconazole, voriconazole -cisapride -dofetilide -dronedarone -linezolid -MAOIs like Carbex, Eldepryl, Marplan, Nardil, and Parnate -mesoridazine -methylene blue (injected into a vein) -pimozide -saquinavir -thioridazine -ziprasidone This medicine may also interact with the following medications: -alcohol -antiviral medicines for HIV or AIDS -aspirin and aspirin-like medicines -barbiturates like phenobarbital -certain medicines for blood pressure, heart disease, irregular heart beat -certain medicines for depression, anxiety, or psychotic disturbances -certain medicines for migraine headache like almotriptan, eletriptan, frovatriptan, naratriptan, rizatriptan, sumatriptan, zolmitriptan -certain medicines for seizures like carbamazepine and phenytoin -certain medicines for sleep -certain medicines that treat or prevent blood clots like dalteparin, enoxaparin, warfarin -digoxin -fentanyl -lithium -NSAIDS, medicines for pain and inflammation, like ibuprofen or naproxen -other medicines that prolong the QT interval (cause an abnormal heart rhythm) -rasagiline -supplements like St. John's wort, kava kava, valerian -tramadol -tryptophan This list may not describe all possible interactions. Give your health care provider a list of all the medicines, herbs, non-prescription drugs, or dietary supplements you use. Also tell them if you smoke, drink alcohol, or use illegal drugs. Some items may interact with your medicine. What should I watch for while using this medicine? Tell your doctor if your symptoms do not get better or if they get worse. Visit your doctor or health care professional for regular checks on your progress. Because it may take several weeks to see the full effects of this medicine, it is important to continue your treatment as  prescribed by your doctor. Patients and their families  should watch out for new or worsening thoughts of suicide or depression. Also watch out for sudden changes in feelings such as feeling anxious, agitated, panicky, irritable, hostile, aggressive, impulsive, severely restless, overly excited and hyperactive, or not being able to sleep. If this happens, especially at the beginning of treatment or after a change in dose, call your health care professional. Dennis Bast may get drowsy or dizzy. Do not drive, use machinery, or do anything that needs mental alertness until you know how this medicine affects you. Do not stand or sit up quickly, especially if you are an older patient. This reduces the risk of dizzy or fainting spells. Alcohol may interfere with the effect of this medicine. Avoid alcoholic drinks. This medicine may cause dry eyes and blurred vision. If you wear contact lenses you may feel some discomfort. Lubricating drops may help. See your eye doctor if the problem does not go away or is severe. Your mouth may get dry. Chewing sugarless gum, sucking hard candy and drinking plenty of water may help. Contact your doctor if the problem does not go away or is severe. What side effects may I notice from receiving this medicine? Side effects that you should report to your doctor or health care professional as soon as possible: -allergic reactions like skin rash, itching or hives, swelling of the face, lips, or tongue -fast, irregular heartbeat -feeling faint or lightheaded, falls -painful erections or other sexual dysfunction -suicidal thoughts or other mood changes -trembling Side effects that usually do not require medical attention (report to your doctor or health care professional if they continue or are bothersome): -constipation -headache -muscle aches or pains -nausea, vomiting -unusually weak or tired This list may not describe all possible side effects. Call your doctor for medical advice  about side effects. You may report side effects to FDA at 1-800-FDA-1088. Where should I keep my medicine? Keep out of the reach of children. Store at room temperature between 15 and 30 degrees C (59 to 86 degrees F). Protect from light. Keep container tightly closed. Throw away any unused medicine after the expiration date. NOTE: This sheet is a summary. It may not cover all possible information. If you have questions about this medicine, talk to your doctor, pharmacist, or health care provider.  2015, [employer] Standard. (2013-06-25 15:46:28)

## 2014-09-12 NOTE — Progress Notes (Signed)
[facility]  Provider:  Larey Seat, M D  Referring Provider: Hendricks Limes, MD Primary Care Physician:  Unice Cobble, MD  Chief Complaint  Patient presents with  . Insomnia    f/u, pt has new problem, Rm 10    HPI:  Janet Steele is a 80 y.o. caucasian, right handed, married  female , who was seen here as a referral from Dr. Linna Darner for a sleep evaluation, Now would like to address her movement disorder. She has rhythmic neck movements that wake her from sleep. Her cat "has left the usual space on the pillow and is now at her hip, for the patient a sign that these movements have begun to bother her. The patient moves her head abrupt in a twitching movement to the left and right.  No tilt , no flexion. Has some leg twitching, too.  She increased her trazodone to 50 mg at night after Belsomra did not work as well.  She wants to increase it further , but I think this may be the medication that makes her twitch. She is on Cymbalta, too. This medication mix and her history of CAD make a narrow choice of tic treatment medication . She cannot use ORAP, CLONIDINE. She may benefit from botox, yet her mouth will get even dryer.     LAST VISIT :   This patient is followed by Dr. Burt Knack for cardiology, and Dr.Hopper for internal medicine.  Mrs. Riggins has been married for over 30 years and lives with her husband. She has former [employer] and has a 4 years college degree. She states that she has been a lifelong insomniac.  She was a child and teenager with insomnia, missed school due to sleep deprivation. She has " a nervous tick"  since age 86 , and was seen at the [facility] clinic. With her previous physicians she had tried already many as sleep aids with very little success. The patient was hospitalized for her cardiac disease when she was first placed on Ambien but is many years ago.  She successfully discontinued the use of Ambien, tried  amitriptyline,  but found of no other medication for a long time that would help her to initiate sleep. Finally, he tried Trazodone and she slept well . Dr Linna Darner felt not comfortable prescribing the medication for her. Since then, she takes Unisom, OTC , 2-3 a night and now felt daytime drowsy. She has a very dry mouth since she takes antihistamines.  She begun to develop restless legs. She feels hot at night , diaphoretic and feels  irritable in daytime. She feels exhausted and a little angry at her physicians.  She had her first heart attack in 1977 while playing tennis, than another in 1983 and followed by a CABG , 2003 another surgery and pacemaker implanted in 2013. No history of CVA or TIA.  I explained sleep hygiene, sleep routines to her and she seems to have known it all, but not all was implemented.   She goes to the bedroom 11 PM and it takes very long for her to fall asleep. tossing and turning, reads when she cannot sleep. Sometimes watch TV in the bedroom.  She rises about 9 or 11 AM , that is variable, depending on how she slept. She has only 1-4 hours of sleep by her perception. She has trouble to attend early morning appointments , and this was interpreted to reflect sleep aid " hang over".  She does not nap in daytime.  Feels hypervigilant. Bedroom is cool, quiet, dark. No pets in the bed. No pain interrupting Sleep, nocturia.  She doesn't drink caffeine but than stated she drinks decaffeinated ice tea- 3-4 glasses , one glass of red wine at dinner -her only  ETOH, non smoker . She has a stable BMI for years, no facial or neck surgeries or traumas.   We discussed several medications and strategies for insomnia treatment as well as a sleep work up for other causes.     Review of Systems: Out of a complete 14 system review, the patient complains of only the following symptoms, and all other reviewed systems are negative. Irritable, insomnia is improved . very dry mouth . Licking ,    Abrupt tick like movements of the face and head- non verbal /non vocal ticks, not TOURETTE -   Epworth was 7 and FSS was 27, GDS 4 points.     History   Social History  . Marital Status: Married    Spouse Name: Janet Steele    Number of Children: 3  . Years of Education: 16   Occupational History  . Retired    .     Social History Main Topics  . Smoking status: Former Smoker    Types: Cigarettes    Quit date: 07/05/1976  . Smokeless tobacco: Never Used     Comment: smoked age18- 1977, up to 1/2 ppd  . Alcohol Use: 2.4 oz/week    4 Glasses of wine per week  . Drug Use: No  . Sexual Activity: No   Other Topics Concern  . Not on file   Social History Narrative   Patient is married Janet Steele) and lives at home with her husband.   Patient has three adult children.   Patient is retired.   Patient has a college education.   Patient is right-handed.   Patient does not drink any caffeine.    Family History  Problem Relation Age of Onset  . Heart attack Father   . Heart disease Sister   . Colon cancer Neg Hx     Past Medical History  Diagnosis Date  . Hypothyroidism   . Hyperlipidemia   . GERD (gastroesophageal reflux disease)   . Anxiety   . Diabetes mellitus   . Hypertension   . PVD (peripheral vascular disease)   . Carotid bruit   . Other dysphagia   . Other esophagitis   . Hemorrhoid   . Other constipation   . Colon, diverticulosis   . Hiatal hernia   . Osteoarthrosis, unspecified whether generalized or localized, unspecified site   . Degenerative joint disease   . Esophageal stricture   . Unspecified hearing loss   . Diastolic dysfunction   . CAD (coronary artery disease)   . Shingles   . Nonspecific abnormal electrocardiogram (ECG) (EKG)   . Nonspecific abnormal electrocardiogram (ECG) (EKG)     Past Surgical History  Procedure Laterality Date  . Appendectomy    . Cholecystectomy    . Abdominal hysterectomy    . Oophorectomy      bilateral  .  Coronary artery bypass graft      x4(1982),02-2002 CABG X2  . Back surgery  01-2000  . Pacemaker insertion  07/06/12    MDT Adapta L implanted by Dr Rayann Heman for symptomatic bradycardia    Current Outpatient Prescriptions  Medication Sig Dispense Refill  . aspirin 325 MG EC tablet Take 325 mg by mouth daily.        Marland Kitchen  atorvastatin (LIPITOR) 80 MG tablet Take 1 tablet (80 mg total) by mouth daily at 6 PM.  30 tablet  5  . DULoxetine (CYMBALTA) 60 MG capsule take 1 capsule by mouth once daily  30 capsule  2  . glimepiride (AMARYL) 2 MG tablet Take 2 mg by mouth daily before breakfast.       . isosorbide mononitrate (IMDUR) 60 MG 24 hr tablet Take 1 tablet (60 mg total) by mouth daily.  30 tablet  5  . levothyroxine (SYNTHROID, LEVOTHROID) 88 MCG tablet Take 88 mcg by mouth daily.      Marland Kitchen losartan (COZAAR) 50 MG tablet Take 1 tablet (50 mg total) by mouth daily.  30 tablet  5  . metFORMIN (GLUCOPHAGE) 500 MG tablet Take 1 tablet (500 mg total) by mouth daily with breakfast.  90 tablet  3  . nitroGLYCERIN (NITROSTAT) 0.4 MG SL tablet Place 1 tablet (0.4 mg total) under the tongue every 5 (five) minutes as needed. For chest pain  25 tablet  5  . pantoprazole (PROTONIX) 40 MG tablet Take 1 tablet (40 mg total) by mouth daily.  30 tablet  5  . Suvorexant (BELSOMRA) 15 MG TABS Take 15 mg by mouth Nightly.  9 tablet  0  . traZODone (DESYREL) 50 MG tablet Prn HS , 1.5 tab at night by mouth.  145 tablet  2  . triamcinolone cream (KENALOG) 0.1 %       . benzonatate (TESSALON) 200 MG capsule Take 1 capsule (200 mg total) by mouth 2 (two) times daily as needed for cough.  20 capsule  0   No current facility-administered medications for this visit.    Allergies as of 09/12/2014 - Review Complete 09/12/2014  Allergen Reaction Noted  . Prednisone Other (See Comments) 04/05/2013  . Codeine Rash 12/03/2009  . Morphine Hives, Itching, and Rash 12/03/2009  . Penicillins Itching and Rash 12/10/2010  . Sulfonamide  derivatives Itching and Rash 12/03/2009  . Hydrocodone-acetaminophen Other (See Comments) 01/04/2008  . Meperidine hcl Itching 12/03/2009  . Metoclopramide hcl Itching and Rash 12/03/2009  . Metoprolol succinate Other (See Comments)   . Moxifloxacin Itching 12/03/2009  . Oxycodone-aspirin Rash 12/03/2009  . Pentazocine lactate Nausea Only 12/03/2009  . Pioglitazone Other (See Comments) 01/04/2008  . Tramadol hcl Other (See Comments) 12/03/2009    Vitals: BP 100/61  Pulse 87  Resp 17  Ht 5' (1.524 m)  Wt 132 lb (59.875 kg)  BMI 25.78 kg/m2 Last Weight:  Wt Readings from Last 1 Encounters:  09/12/14 132 lb (59.875 kg)   Last Height:   Ht Readings from Last 1 Encounters:  09/12/14 5' (1.524 m)    Physical exam:  General: The patient is awake, alert and appears not in acute distress. The patient is well groomed. Head: Normocephalic, atraumatic. Neck is supple. Mallampati 2 , neck circumference: 14 inches , dry mouth, she appears to constantly chew.  Cardiovascular:  Regular rate and rhythm , without  murmurs or carotid bruit, and without distended neck veins. Respiratory: Lungs are clear to auscultation. Skin:  Without evidence of edema, or rash Trunk: BMI is normal posture.  Neurologic exam : The patient is awake and alert, oriented to place and time.  Memory subjective described as intact.  There is a reduced  attention span & concentration ability.  Speech is fluent without dysarthria, dysphonia or aphasia. Mood and affect - she appears impulsive.  Cranial nerves: Pupils are equal and briskly reactive to light. Funduscopic  exam without   evidence of pallor or edema.  Extraocular movements in vertical and horizontal planes intact and without nystagmus. Visual fields by finger perimetry are intact. Hearing impaired .  Facial sensation intact to fine touch. Facial motor strength is symmetric and tongue and uvula move midline.  Motor exam:  Normal tone and normal muscle bulk  and symmetric normal strength in all extremities.  Sensory:  Fine touch, pinprick and vibration were tested in all extremities. Proprioception is  normal.  Coordination: Rapid alternating movements in the fingers/hands is tested and normal. Finger-to-nose maneuver tested  without evidence of ataxia, dysmetria or tremor.  Gait and station: Patient walks without assistive device ,climbs up to the exam table.  She is restless, Strength within normal limits. Stance is stable and normal.  Deep tendon reflexes: in the  upper and lower extremities are symmetric and intact. Babinski maneuver response is  downgoing.   Assessment:  After physical and neurologic examination, review of laboratory studies, imaging, neurophysiology testing and pre-existing records, assessment is  1) patient with chronic insomnia and long term dependence of sleep aids. The least addictive and not EKG changing medication would be TRAZODONE.  She was never evaluated for sleep disorders, there is evidence of snoring , dry mouth.  Epworth was 3 and FSS was 27, GDS one point.   Plan:  Treatment plan and additional workup :  PSG ordered.  Trazodone resumed , she used to take 125 mg, which is a lot . Now on 50 mg with option of 25 mg extra as needed.  belsomra  15 mg ordered  Botox evaluation ordered, Tic treatment choices are limited   68minutes visit with sleep education and sleep hygiene education.  Questions answered.

## 2014-09-16 ENCOUNTER — Other Ambulatory Visit: Payer: Self-pay | Admitting: Neurology

## 2014-09-16 ENCOUNTER — Ambulatory Visit
Admission: RE | Admit: 2014-09-16 | Discharge: 2014-09-16 | Disposition: A | Discharge status: Home / Self Care | Payer: Medicare Other | Source: Ambulatory Visit | Attending: Neurological Surgery | Admitting: Neurological Surgery

## 2014-09-16 VITALS — BP 121/56 | HR 81

## 2014-09-16 DIAGNOSIS — M4310 Spondylolisthesis, site unspecified: Secondary | ICD-10-CM

## 2014-09-16 DIAGNOSIS — M545 Low back pain: Secondary | ICD-10-CM | POA: Diagnosis not present

## 2014-09-16 MED ORDER — IOHEXOL 180 MG/ML  SOLN
1.0000 mL | Freq: Once | INTRAMUSCULAR | Status: AC | PRN
  Administered 2014-09-16: 1 mL via EPIDURAL

## 2014-09-16 MED ORDER — METHYLPREDNISOLONE ACETATE 40 MG/ML INJ SUSP (RADIOLOG
120.0000 mg | Freq: Once | INTRAMUSCULAR | Status: AC
  Administered 2014-09-16: 120 mg via EPIDURAL

## 2014-09-16 NOTE — Telephone Encounter (Signed)
[place] Pharmacist at [facility] [phone],  Questioning if amount could be changed from 9 tablet to 10 tablets of Suvorexant (BELSOMRA) 15 MG TABS.  Medication is pre packed for 10 a pack.  Please call and advise.

## 2014-09-16 NOTE — Discharge Instructions (Signed)

## 2014-09-16 NOTE — Telephone Encounter (Signed)
Request forwarded to provider for approval since this is a controlled substance drug.

## 2014-09-17 MED ORDER — SUVOREXANT 15 MG PO TABS: 15.0000 mg | ORAL_TABLET | Freq: Every evening | ORAL | Status: DC

## 2014-09-17 NOTE — Telephone Encounter (Signed)
Rx signed and faxed.

## 2014-09-27 DIAGNOSIS — Z23 Encounter for immunization: Secondary | ICD-10-CM | POA: Diagnosis not present

## 2014-10-16 ENCOUNTER — Encounter: Payer: Self-pay | Admitting: Neurology

## 2014-10-30 ENCOUNTER — Encounter: Payer: Self-pay | Admitting: Neurology

## 2014-10-30 ENCOUNTER — Ambulatory Visit (INDEPENDENT_AMBULATORY_CARE_PROVIDER_SITE_OTHER): Payer: Medicare Other | Admitting: Neurology

## 2014-10-30 VITALS — BP 109/68 | HR 89 | Ht 62.0 in | Wt 186.0 lb

## 2014-10-30 DIAGNOSIS — I25810 Atherosclerosis of coronary artery bypass graft(s) without angina pectoris: Secondary | ICD-10-CM | POA: Diagnosis not present

## 2014-10-30 DIAGNOSIS — F951 Chronic motor or vocal tic disorder: Secondary | ICD-10-CM | POA: Diagnosis not present

## 2014-10-30 NOTE — Progress Notes (Signed)
PATIENT: Janet Steele DOB: 07/02/1929  HISTORICAL  AVENA HILLIER is a 80 years old right-handed Caucasian female, referred by Dr. Brett Fairy for evaluation of possible EMG guided Botox treatment for her abnormal facial movement, and neck jerking movement.  She had a past medical history of coronary artery disease, status post open heart surgery, pacemaker placement, hypertension, diabetes, hyperlipidemia, see sleep specialist to Dr. Brett Fairy for insomnia, which has improved with trazodone treatment  She reported a history of motor tics since teenager, she has intermittent body jerking movement, grunting sound, over the years, she has been very self-consciousness about her abnormal movement, she always trying to find backseat in the church.  Over the past few years, in addition to her abrupt body jerking movement, she also developed this neck jerking movement, usually, she has chronic neck jerking movement to her right side, she can temporarily suppress it, but always came back in event.  Over the years, she has tried different medications, see different specialists, including [facility] consultations, without improving her symptoms  REVIEW OF SYSTEMS: Full 14 system review of systems performed and notable only for back pain, bruises  ALLERGIES: Allergies  Allergen Reactions  . Codeine Rash  . Morphine Hives, Itching and Rash  . Penicillins Itching and Rash  . Prednisone Other (See Comments)    MEMORY LOSS   . Sulfonamide Derivatives Itching and Rash  . Hydrocodone-Acetaminophen Other (See Comments)    unknown  . Meperidine Hcl Itching  . Metoclopramide Hcl Itching and Rash  . Metoprolol Succinate Other (See Comments)    nervous  . Moxifloxacin Itching  . Oxycodone-Aspirin Rash  . Pentazocine Lactate Nausea Only  . Pioglitazone Other (See Comments)    bloating  . Tramadol Hcl Other (See Comments)    unknown    HOME MEDICATIONS: Current Outpatient Prescriptions on File  Prior to Visit  Medication Sig Dispense Refill  . aspirin 325 MG EC tablet Take 325 mg by mouth daily.      Marland Kitchen atorvastatin (LIPITOR) 80 MG tablet Take 1 tablet (80 mg total) by mouth daily at 6 PM. 30 tablet 5  . benzonatate (TESSALON) 200 MG capsule Take 1 capsule (200 mg total) by mouth 2 (two) times daily as needed for cough. 20 capsule 0  . DULoxetine (CYMBALTA) 60 MG capsule take 1 capsule by mouth once daily 30 capsule 2  . glimepiride (AMARYL) 2 MG tablet Take 2 mg by mouth daily before breakfast.     . isosorbide mononitrate (IMDUR) 60 MG 24 hr tablet Take 1 tablet (60 mg total) by mouth daily. 30 tablet 5  . levothyroxine (SYNTHROID, LEVOTHROID) 88 MCG tablet Take 88 mcg by mouth daily.    Marland Kitchen losartan (COZAAR) 50 MG tablet Take 1 tablet (50 mg total) by mouth daily. 30 tablet 5  . metFORMIN (GLUCOPHAGE) 500 MG tablet Take 1 tablet (500 mg total) by mouth daily with breakfast. 90 tablet 3  . nitroGLYCERIN (NITROSTAT) 0.4 MG SL tablet Place 1 tablet (0.4 mg total) under the tongue every 5 (five) minutes as needed. For chest pain 25 tablet 5  . pantoprazole (PROTONIX) 40 MG tablet Take 1 tablet (40 mg total) by mouth daily. 30 tablet 5  . Suvorexant (BELSOMRA) 15 MG TABS Take 15 mg by mouth Nightly. 10 tablet 0  . traZODone (DESYREL) 50 MG tablet Prn HS , 1.5 tab at night by mouth. 145 tablet 2  . triamcinolone cream (KENALOG) 0.1 %  No current facility-administered medications on file prior to visit.    PAST MEDICAL HISTORY: Past Medical History  Diagnosis Date  . Hypothyroidism   . Hyperlipidemia   . GERD (gastroesophageal reflux disease)   . Anxiety   . Diabetes mellitus   . Hypertension   . PVD (peripheral vascular disease)   . Carotid bruit   . Other dysphagia   . Other esophagitis   . Hemorrhoid   . Other constipation   . Colon, diverticulosis   . Hiatal hernia   . Osteoarthrosis, unspecified whether generalized or localized, unspecified site   . Degenerative  joint disease   . Esophageal stricture   . Unspecified hearing loss   . Diastolic dysfunction   . CAD (coronary artery disease)   . Shingles   . Nonspecific abnormal electrocardiogram (ECG) (EKG)   . Nonspecific abnormal electrocardiogram (ECG) (EKG)     PAST SURGICAL HISTORY: Past Surgical History  Procedure Laterality Date  . Appendectomy    . Cholecystectomy    . Abdominal hysterectomy    . Oophorectomy      bilateral  . Coronary artery bypass graft      x4(1982),02-2002 CABG X2  . Back surgery  01-2000  . Pacemaker insertion  07/06/12    MDT Adapta L implanted by Dr Rayann Heman for symptomatic bradycardia    FAMILY HISTORY: Family History  Problem Relation Age of Onset  . Heart attack Father   . Heart disease Sister   . Colon cancer Neg Hx     SOCIAL HISTORY:  History   Social History  . Marital Status: Married    Spouse Name: [name]    Number of Children: 3  . Years of Education: 16   Occupational History  . Retired    .     Social History Main Topics  . Smoking status: Former Smoker    Types: Cigarettes    Quit date: 07/05/1976  . Smokeless tobacco: Never Used     Comment: smoked age18- 1977, up to 1/2 ppd  . Alcohol Use: 2.4 oz/week    4 Glasses of wine per week  . Drug Use: No  . Sexual Activity: No   Other Topics Concern  . Not on file   Social History Narrative   Patient is married [name]) and lives at home with her husband.   Patient has three adult children.   Patient is retired.   Patient has a college education.   Patient is right-handed.   Patient does not drink any caffeine.     PHYSICAL EXAM   Filed Vitals:   10/30/14 1609  BP: 109/68  Pulse: 89  Height: 5\' 2"  (1.575 m)  Weight: 186 lb (84.369 kg)    Not recorded      Body mass index is 34.01 kg/(m^2).   Generalized: In no acute distress  Neck: Supple, no carotid bruits   Cardiac: Regular rate rhythm  Pulmonary: Clear to auscultation  bilaterally  Musculoskeletal: No deformity  Neurological examination  Mentation: Alert oriented to time, place, history taking, and causual conversation, she has intermittent grunting sound, body flexion quick jerking movement, frequent neck jerking movement to the right side, occasional left facial twist movement  Cranial nerve II-XII: Pupils were equal round reactive to light. Extraocular movements were full.  Visual field were full on confrontational test. Bilateral fundi were sharp.  Facial sensation and strength were normal. Hearing was intact to finger rubbing bilaterally. Uvula tongue midline.  Head turning and shoulder shrug  and were normal and symmetric.Tongue protrusion into cheek strength was normal.  Motor: Normal tone, bulk and strength.  Sensory: Intact to fine touch, pinprick, preserved vibratory sensation, and proprioception at toes.  Coordination: Normal finger to nose, heel-to-shin bilaterally there was no truncal ataxia  Gait: Rising up from seated position without assistance, normal stance, without trunk ataxia, moderate stride, good arm swing, smooth turning,   Romberg signs: Negative  Deep tendon reflexes: Brachioradialis 2/2, biceps 2/2, triceps 2/2, patellar 2/2, Achilles 2/2, plantar responses were flexor bilaterally.   DIAGNOSTIC DATA (LABS, IMAGING, TESTING) - I reviewed patient records, labs, notes, testing and imaging myself where available.  Lab Results  Component Value Date   WBC 5.6 03/05/2014   HGB 13.8 03/05/2014   HCT 40.3 03/05/2014   MCV 84.5 03/05/2014   PLT 226 03/05/2014      Component Value Date/Time   NA 136* 03/03/2014 0330   K 4.2 03/03/2014 0330   CL 102 03/03/2014 0330   CO2 22 03/03/2014 0330   GLUCOSE 146* 03/03/2014 0330   BUN 11 03/03/2014 0330   CREATININE 0.80 03/03/2014 0330   CALCIUM 8.4 03/03/2014 0330   PROT 7.0 05/15/2014 0905   ALBUMIN 4.1 05/15/2014 0905   AST 21 05/15/2014 0905   ALT 20 05/15/2014 0905    ALKPHOS 77 05/15/2014 0905   BILITOT 1.1 05/15/2014 0905   GFRNONAA 67* 03/03/2014 0330   GFRAA 77* 03/03/2014 0330   Lab Results  Component Value Date   CHOL 212* 05/15/2014   HDL 32.80* 05/15/2014   LDLCALC 143* 05/15/2014   LDLDIRECT 98.6 02/23/2010   TRIG 182.0* 05/15/2014   CHOLHDL 6 05/15/2014   Lab Results  Component Value Date   HGBA1C 7.6* 03/02/2014   Lab Results  Component Value Date   VITAMINB12 437 11/12/2011    ASSESSMENT AND PLAN  SHAKEENA CANO is a 80 y.o. female with long standing history of voice, and motor tics, in recent few years, she also developed neck jerking movement, most consistent with motor takes, there was also some left facial motor tics, she has tried and failed multiple different medications in the past, she also has past medical history of hypertension, diabetes, hyperlipidemia, coronary artery disease, status post CABG, pacemaker placement, not a good candidate for polypharmacy treatment  1, Botox injection should be reasonable choice if it can be approved by her insurance company, 2. Initiate preauthorization process 3, I will call her for result in 2-3 weeks   Marcial Pacas, M.D. Ph.D.  [facility] [address], [place],  [address] [phone]

## 2014-11-05 ENCOUNTER — Telehealth: Payer: Self-pay | Admitting: Cardiovascular Disease

## 2014-11-05 NOTE — Telephone Encounter (Signed)
Follow Up  Pt husband called to make an appt for his wife. Made the appt for him with Ellen Henri on 11/08/2014 @ 2:30 but he will not disclose why the appt is needed and requests to speak with lauren only. Please call

## 2014-11-05 NOTE — Telephone Encounter (Signed)
I spoke with the pt and she is aware of the appointment that her husband scheduled for her on 11/08/14.

## 2014-11-07 ENCOUNTER — Other Ambulatory Visit: Payer: Self-pay | Admitting: Cardiology

## 2014-11-08 ENCOUNTER — Encounter: Payer: Self-pay | Admitting: Cardiology

## 2014-11-08 ENCOUNTER — Ambulatory Visit (INDEPENDENT_AMBULATORY_CARE_PROVIDER_SITE_OTHER): Payer: Medicare Other | Admitting: Cardiology

## 2014-11-08 VITALS — BP 83/49 | HR 65 | Ht 62.0 in | Wt 135.1 lb

## 2014-11-08 DIAGNOSIS — I214 Non-ST elevation (NSTEMI) myocardial infarction: Secondary | ICD-10-CM

## 2014-11-08 MED ORDER — RANOLAZINE ER 500 MG PO TB12: 500.0000 mg | ORAL_TABLET | Freq: Two times a day (BID) | ORAL | Status: DC

## 2014-11-08 NOTE — Patient Instructions (Addendum)
.  Your physician has recommended you make the following change in your medication:    START TAKING 25 MG OF LOSARTAN  START TAKING RENEXA 500 MG TWICE A DAY    Your physician recommends that you schedule a follow-up appointment in: WITH DR COOPER IN 3 TO 4 WEEKS IF POSSIBLE   IF YOU BECOME LIGHTHEADED DIZZY OR HAVE ANY SEVERE CHEST PAIN AND YOUR SUBLINGUAL NITROGLYCERIN DOES NOT HELP YOU  PLEASE GO TO [place]

## 2014-11-08 NOTE — Progress Notes (Signed)
11/08/2014 Janet Steele   06/25/1929  [id number]  Primary Physician Unice Cobble, MD Primary Cardiologist: Dr. Burt Knack  HPI:  Janet Steele is an 80 year old female, followed by Dr. Burt Knack, who presents to clinic today for evaluation after experiencing intermittent episodes of chest discomfort 1 week ago. She has a history of extensive CAD status post redo CABG, initially in 1982 and most recent surgery in 2003. She most recently presented with non-STEMI in April of this year. She underwent cardiac catheterization demonstrating occlusion of her native coronary arteries with continued patency of the LIMA to LAD and saphenous vein graft to PDA. There is diffuse stenosis between the PDA and the 2 LV branches of the distal right coronary artery. There were collaterals supply the left circumflex distribution. There were no targets amenable to revascularization. Since that time, she has been managed with aspirin, Imdur, atorvastatin and Cozaar. She is not on beta blocker therapy due to allergy/intolerance.  Today in clinic, she reports 3 days of intermittent chest discomfort. The first episode occurred last Friday, November 27. She noted substernal chest pressure that was worse with exertion. It was similar to her prior anginal symptoms however less severe. She had no other associated symptoms, denying dyspnea, diaphoresis, nausea, vomiting, syncope/near-syncope. The episode was relieved was SL nitroglycerin. She continued to have several recurrent episodes throughout the weekend. She notes that her most severe episode occurred on Sunday, November 29. This episode required 3 nitroglycerin tablets before resolving. She did not seek emergency medical evaluation at that time. Since then, she denies any recurrent episodes. She reports full medication compliance and has not missed any doses of her nitrate. No  chest pain in clinic today. EKG demonstrates T-wave abnormalities in the anterior lateral leads, however  this is unchanged compared to prior tracings. She is atrially paced with ventricular rate of 64 bpm. She is hypotensive in clinic today with systolic blood pressure in the 80s. She denies dizziness, lightheadedness, syncope/near syncope, however she states she that she has been very fatigued/tired.  Current Outpatient Prescriptions  Medication Sig Dispense Refill  . aspirin 325 MG EC tablet Take 325 mg by mouth daily.      Marland Kitchen atorvastatin (LIPITOR) 80 MG tablet Take 1 tablet (80 mg total) by mouth daily at 6 PM. 30 tablet 5  . DULoxetine (CYMBALTA) 60 MG capsule take 1 capsule by mouth once daily 30 capsule 2  . glimepiride (AMARYL) 2 MG tablet Take 2 mg by mouth daily before breakfast.     . isosorbide mononitrate (IMDUR) 60 MG 24 hr tablet Take 1 tablet (60 mg total) by mouth daily. 30 tablet 5  . levothyroxine (SYNTHROID, LEVOTHROID) 88 MCG tablet Take 88 mcg by mouth daily.    Marland Kitchen losartan (COZAAR) 50 MG tablet Take 1 tablet (50 mg total) by mouth daily. 30 tablet 5  . metFORMIN (GLUCOPHAGE) 500 MG tablet Take 1 tablet (500 mg total) by mouth daily with breakfast. 90 tablet 3  . nitroGLYCERIN (NITROSTAT) 0.4 MG SL tablet Place 1 tablet (0.4 mg total) under the tongue every 5 (five) minutes as needed. For chest pain 25 tablet 5  . pantoprazole (PROTONIX) 40 MG tablet Take 1 tablet (40 mg total) by mouth daily. 30 tablet 5  . traZODone (DESYREL) 50 MG tablet Prn HS , 1.5 tab at night by mouth. 145 tablet 2   No current facility-administered medications for this visit.    Allergies  Allergen Reactions  . Codeine Rash  . Morphine Hives,  Itching and Rash  . Penicillins Itching and Rash  . Prednisone Other (See Comments)    MEMORY LOSS   . Sulfonamide Derivatives Itching and Rash  . Hydrocodone-Acetaminophen Other (See Comments)    unknown  . Meperidine Hcl Itching  . Metoclopramide Hcl Itching and Rash  . Metoprolol Succinate Other (See Comments)    nervous  . Moxifloxacin Itching  .  Oxycodone-Aspirin Rash  . Pentazocine Lactate Nausea Only  . Pioglitazone Other (See Comments)    bloating  . Tramadol Hcl Other (See Comments)    unknown    History   Social History  . Marital Status: Married    Spouse Name: [name]    Number of Children: 3  . Years of Education: 16   Occupational History  . Retired    .     Social History Main Topics  . Smoking status: Former Smoker    Types: Cigarettes    Quit date: 07/05/1976  . Smokeless tobacco: Never Used     Comment: smoked age18- 1977, up to 1/2 ppd  . Alcohol Use: 2.4 oz/week    4 Glasses of wine per week  . Drug Use: No  . Sexual Activity: No   Other Topics Concern  . Not on file   Social History Narrative   Patient is married [name]) and lives at home with her husband.   Patient has three adult children.   Patient is retired.   Patient has a college education.   Patient is right-handed.   Patient does not drink any caffeine.     Review of Systems: General: negative for chills, fever, night sweats or weight changes.  Cardiovascular: negative for chest pain, dyspnea on exertion, edema, orthopnea, palpitations, paroxysmal nocturnal dyspnea or shortness of breath Dermatological: negative for rash Respiratory: negative for cough or wheezing Urologic: negative for hematuria Abdominal: negative for nausea, vomiting, diarrhea, bright red blood per rectum, melena, or hematemesis Neurologic: negative for visual changes, syncope, or dizziness All other systems reviewed and are otherwise negative except as noted above.    Blood pressure 83/49, pulse 65, height 5\' 2"  (1.575 m), weight 135 lb 1.9 oz (61.29 kg).  General appearance: alert, cooperative and no distress Neck: no carotid bruit and no JVD Lungs: clear to auscultation bilaterally Heart: regular rate and rhythm, S1, S2 normal, no murmur, click, rub or gallop Extremities: no LEE Pulses: 2+ and symmetric Skin: warm and dry Neurologic: Grossly  normal  EKG anterior lateral T-wave abnormalities noted on EKG, however unchanged compared to prior tracing. Atrially paced rhythm. Heart rate 64 bpm.  ASSESSMENT AND PLAN:   1. Chest pain: Patient has an extensive CAD history with initial CABG in 1982 and redo in 2003 as well as a non-STEMI in April of this year with left heart catheterization demonstrating occlusion of her native coronary arteries with continued patency of the LIMA to LAD and saphenous vein graft. There was diffuse stenosis between the PDA and the 2 LV branches of the distal right coronary artery however there were collaterals supplying the left circumflex distribution. There were no targets amenable to revascularization. Recent chest pain episodes are concerning for unstable angina, in that they have occurred at rest and are somewhat worsened with exertion. She is currently on 60 mg of Imdur once daily. She is not on beta blocker therapy due to allergy/intolerance. Unfortunately, we are unable to further titrate her Imdur due to hypotension. I have discussed case with Dr. Angelena Form. We have decided to place  her on a trial of Ranexa, 500 mg twice a day. We'll have her follow-up with Dr. Burt Knack in 3 weeks for reevaluation. She has been instructed to seek emergency medical attention if she develops recurrent anginal symptoms unrelieved with sublingual nitroglycerin. She verbalized understanding.  2. Hypotension: Systolic blood pressure is in the 80s. Besides fatigue, she denies other symptoms including dizziness, lightheadedness, syncope/near-syncope. I decided to decrease her losartan from 50 mg to 25 mg and we will keep her current dose of Imdur the same given anginal symptoms. We will also check a CBC to rule out anemia as cause of her hypotension/fatigue/chest pain. She has been instructed to notify our office if she develops any symptoms. She verbalized understanding. Her blood pressure will be reassessed at her follow-up office visit  with Dr. Burt Knack.   PLAN  add Ranexa 500 mg twice a day for recurrent angina. Decrease losartan and 25 mg daily due to hypotension. Check CBC. Follow-up with Dr. Burt Knack in 3 weeks for reevaluation.  Cordia Miklos, BRITTAINYPA-C 11/08/2014 3:00 PM

## 2014-11-14 ENCOUNTER — Other Ambulatory Visit: Payer: Self-pay | Admitting: Neurological Surgery

## 2014-11-14 ENCOUNTER — Encounter (HOSPITAL_COMMUNITY): Payer: Self-pay | Admitting: Internal Medicine

## 2014-11-14 DIAGNOSIS — M4806 Spinal stenosis, lumbar region: Secondary | ICD-10-CM | POA: Diagnosis not present

## 2014-11-14 DIAGNOSIS — Z6825 Body mass index (BMI) 25.0-25.9, adult: Secondary | ICD-10-CM | POA: Diagnosis not present

## 2014-11-14 DIAGNOSIS — M546 Pain in thoracic spine: Secondary | ICD-10-CM | POA: Diagnosis not present

## 2014-11-14 DIAGNOSIS — M4316 Spondylolisthesis, lumbar region: Secondary | ICD-10-CM | POA: Diagnosis not present

## 2014-11-14 DIAGNOSIS — M48061 Spinal stenosis, lumbar region without neurogenic claudication: Secondary | ICD-10-CM

## 2014-11-19 ENCOUNTER — Telehealth: Payer: Self-pay | Admitting: Cardiovascular Disease

## 2014-11-19 NOTE — Telephone Encounter (Signed)
New message     Talk to a nurse regarding her last lab test

## 2014-11-19 NOTE — Telephone Encounter (Signed)
Called patient back. She wanted to know what was low at her last visit. Advised that her BP was low and the provider advised that she decrease losartan from 50 mg to 25 mg. She states she will cut in half and take one half of a losartan every day.

## 2014-12-02 ENCOUNTER — Other Ambulatory Visit: Payer: Self-pay

## 2014-12-02 MED ORDER — ATORVASTATIN CALCIUM 80 MG PO TABS: 80.0000 mg | ORAL_TABLET | Freq: Every day | ORAL | Status: DC

## 2014-12-02 NOTE — Telephone Encounter (Signed)
Rx sent to pharmacy   

## 2014-12-03 ENCOUNTER — Ambulatory Visit
Admission: RE | Admit: 2014-12-03 | Discharge: 2014-12-03 | Disposition: A | Discharge status: Home / Self Care | Payer: Medicare Other | Source: Ambulatory Visit | Attending: Neurological Surgery | Admitting: Neurological Surgery

## 2014-12-03 DIAGNOSIS — M4806 Spinal stenosis, lumbar region: Secondary | ICD-10-CM | POA: Diagnosis not present

## 2014-12-03 DIAGNOSIS — M4316 Spondylolisthesis, lumbar region: Secondary | ICD-10-CM | POA: Diagnosis not present

## 2014-12-03 DIAGNOSIS — M5126 Other intervertebral disc displacement, lumbar region: Secondary | ICD-10-CM | POA: Diagnosis not present

## 2014-12-03 DIAGNOSIS — M48061 Spinal stenosis, lumbar region without neurogenic claudication: Secondary | ICD-10-CM

## 2014-12-03 DIAGNOSIS — M5137 Other intervertebral disc degeneration, lumbosacral region: Secondary | ICD-10-CM | POA: Diagnosis not present

## 2014-12-04 ENCOUNTER — Ambulatory Visit (INDEPENDENT_AMBULATORY_CARE_PROVIDER_SITE_OTHER): Payer: Medicare Other | Admitting: Neurology

## 2014-12-04 DIAGNOSIS — G243 Spasmodic torticollis: Secondary | ICD-10-CM | POA: Insufficient documentation

## 2014-12-04 DIAGNOSIS — F959 Tic disorder, unspecified: Secondary | ICD-10-CM

## 2014-12-04 MED ORDER — ONABOTULINUMTOXINA 100 UNITS IJ SOLR
100.0000 [IU] | Freq: Once | INTRAMUSCULAR | Status: AC
  Administered 2014-12-04: 100 [IU] via INTRAMUSCULAR

## 2014-12-04 NOTE — Progress Notes (Signed)
PATIENT: Janet Steele DOB: [birth year]  HISTORICAL  Janet Steele is a 80 years old right-handed Caucasian female, referred by Dr. Brett Fairy for evaluation of possible EMG guided Botox treatment for her abnormal facial movement, and neck jerking movement.  She had a past medical history of coronary artery disease, status post open heart surgery, pacemaker placement, hypertension, diabetes, hyperlipidemia, see sleep specialist to Dr. Brett Fairy for insomnia, which has improved with trazodone treatment  She reported a history of motor tics since teenager, she has intermittent body jerking movement, grunting sound, over the years, she has been very self-consciousness about her abnormal movement, she always trying to find backseat in the church.  Over the past few years, in addition to her abrupt body jerking movement, she also developed this neck jerking movement, usually, she has chronic neck jerking movement to her right side, she can temporarily suppress it, but always came back in event.  Over the years, she has tried different medications, see different specialists, including [facility] consultations, without improving her symptoms  UPDATE 12/04/2014: She came in for EMG guided Botox injection for the first time, potential side effect explained, including muscle weakness, distant spread, trouble swallowing, consent form was signed.  REVIEW OF SYSTEMS: Full 14 system review of systems performed and notable only for as above  ALLERGIES: Allergies  Allergen Reactions  . Codeine Rash  . Morphine Hives, Itching and Rash  . Penicillins Itching and Rash  . Prednisone Other (See Comments)    MEMORY LOSS   . Sulfonamide Derivatives Itching and Rash  . Hydrocodone-Acetaminophen Other (See Comments)    unknown  . Meperidine Hcl Itching  . Metoclopramide Hcl Itching and Rash  . Metoprolol Succinate Other (See Comments)    nervous  . Moxifloxacin Itching  . Oxycodone-Aspirin Rash  .  Pentazocine Lactate Nausea Only  . Pioglitazone Other (See Comments)    bloating  . Tramadol Hcl Other (See Comments)    unknown    HOME MEDICATIONS: Current Outpatient Prescriptions on File Prior to Visit  Medication Sig Dispense Refill  . aspirin 325 MG EC tablet Take 325 mg by mouth daily.      Marland Kitchen atorvastatin (LIPITOR) 80 MG tablet Take 1 tablet (80 mg total) by mouth daily at 6 PM. 30 tablet 5  . DULoxetine (CYMBALTA) 60 MG capsule take 1 capsule by mouth once daily 30 capsule 2  . glimepiride (AMARYL) 2 MG tablet Take 2 mg by mouth daily before breakfast.     . isosorbide mononitrate (IMDUR) 60 MG 24 hr tablet Take 1 tablet (60 mg total) by mouth daily. 30 tablet 5  . levothyroxine (SYNTHROID, LEVOTHROID) 88 MCG tablet Take 88 mcg by mouth daily.    Marland Kitchen losartan (COZAAR) 50 MG tablet Take 1 tablet (50 mg total) by mouth daily. 30 tablet 5  . metFORMIN (GLUCOPHAGE) 500 MG tablet Take 1 tablet (500 mg total) by mouth daily with breakfast. 90 tablet 3  . nitroGLYCERIN (NITROSTAT) 0.4 MG SL tablet Place 1 tablet (0.4 mg total) under the tongue every 5 (five) minutes as needed. For chest pain 25 tablet 5  . pantoprazole (PROTONIX) 40 MG tablet take 1 tablet by mouth once daily 30 tablet 3  . ranolazine (RANEXA) 500 MG 12 hr tablet Take 1 tablet (500 mg total) by mouth 2 (two) times daily. 60 tablet 5  . traZODone (DESYREL) 50 MG tablet Prn HS , 1.5 tab at night by mouth. 145 tablet 2   No  current facility-administered medications on file prior to visit.    PAST MEDICAL HISTORY: Past Medical History  Diagnosis Date  . Hypothyroidism   . Hyperlipidemia   . GERD (gastroesophageal reflux disease)   . Anxiety   . Diabetes mellitus   . Hypertension   . PVD (peripheral vascular disease)   . Carotid bruit   . Other dysphagia   . Other esophagitis   . Hemorrhoid   . Other constipation   . Colon, diverticulosis   . Hiatal hernia   . Osteoarthrosis, unspecified whether generalized or  localized, unspecified site   . Degenerative joint disease   . Esophageal stricture   . Unspecified hearing loss   . Diastolic dysfunction   . CAD (coronary artery disease)   . Shingles   . Nonspecific abnormal electrocardiogram (ECG) (EKG)   . Nonspecific abnormal electrocardiogram (ECG) (EKG)     PAST SURGICAL HISTORY: Past Surgical History  Procedure Laterality Date  . Appendectomy    . Cholecystectomy    . Abdominal hysterectomy    . Oophorectomy      bilateral  . Coronary artery bypass graft      x4(1982),02-2002 CABG X2  . Back surgery  01-2000  . Pacemaker insertion  07/06/12    MDT Adapta L implanted by Dr Rayann Heman for symptomatic bradycardia  . Permanent pacemaker insertion N/A 07/06/2012    Procedure: PERMANENT PACEMAKER INSERTION;  Surgeon: Thompson Grayer, MD;  Location: [facility] CATH LAB;  Service: Cardiovascular;  Laterality: N/A;  . Left heart catheterization with coronary/graft angiogram N/A 03/05/2014    Procedure: LEFT HEART CATHETERIZATION WITH Beatrix Fetters;  Surgeon: Sinclair Grooms, MD;  Location[facility] C[facility] LAB;  Service: Cardiovascular;  Laterality: N/A;    FAMILY HISTORY: Family History  Problem Relation Age of Onset  . Heart attack Father   . Heart disease Sister   . Colon cancer Neg Hx     SOCIAL HISTORY:  History   Social History  . Marital Status: Married    Spouse Name: [name]    Number of Children: 3  . Years of Education: 16   Occupational History  . Retired    .     Social History Main Topics  . Smoking status: Former Smoker    Types: Cigarettes    Quit date: 07/05/1976  . Smokeless tobacco: Never Used     Comment: smoked age18- 1977, up to 1/2 ppd  . Alcohol Use: 2.4 oz/week    4 Glasses of wine per week  . Drug Use: No  . Sexual Activity: No   Other Topics Concern  . Not on file   Social History Narrative   Patient is married [name]) and lives at home with her husband.   Patient has three adult children.   Patient is  retired.   Patient has a college education.   Patient is right-handed.   Patient does not drink any caffeine.     PHYSICAL EXAM   Filed Vitals:    Not recorded      Cannot calculate BMI with a height equal to zero.   Generalized: In no acute distress  Neck: Supple, no carotid bruits   Cardiac: Regular rate rhythm  Pulmonary: Clear to auscultation bilaterally  Musculoskeletal: No deformity  Neurological examination  Mentation: Alert oriented to time, place, history taking, and causual conversation, she has intermittent grunting sound, body flexion quick jerking movement, frequent neck jerking movement to the right side, occasional left facial twist movement  Cranial  nerve II-XII: Pupils were equal round reactive to light. Extraocular movements were full.  Visual field were full on confrontational test. Bilateral fundi were sharp.  Facial sensation and strength were normal. Hearing was intact to finger rubbing bilaterally. Uvula tongue midline.  Head turning and shoulder shrug and were normal and symmetric.Tongue protrusion into cheek strength was normal.  Motor: Normal tone, bulk and strength.  Sensory: Intact to fine touch, pinprick, preserved vibratory sensation, and proprioception at toes.  Coordination: Normal finger to nose, heel-to-shin bilaterally there was no truncal ataxia  Gait: Rising up from seated position without assistance, normal stance, without trunk ataxia, moderate stride, good arm swing, smooth turning,   Romberg signs: Negative  Deep tendon reflexes: Brachioradialis 2/2, biceps 2/2, triceps 2/2, patellar 2/2, Achilles 2/2, plantar responses were flexor bilaterally.   DIAGNOSTIC DATA (LABS, IMAGING, TESTING) - I reviewed patient records, labs, notes, testing and imaging myself where available.  Lab Results  Component Value Date   WBC 5.6 03/05/2014   HGB 13.8 03/05/2014   HCT 40.3 03/05/2014   MCV 84.5 03/05/2014   PLT 226 03/05/2014        Component Value Date/Time   NA 136* 03/03/2014 0330   K 4.2 03/03/2014 0330   CL 102 03/03/2014 0330   CO2 22 03/03/2014 0330   GLUCOSE 146* 03/03/2014 0330   BUN 11 03/03/2014 0330   CREATININE 0.80 03/03/2014 0330   CALCIUM 8.4 03/03/2014 0330   PROT 7.0 05/15/2014 0905   ALBUMIN 4.1 05/15/2014 0905   AST 21 05/15/2014 0905   ALT 20 05/15/2014 0905   ALKPHOS 77 05/15/2014 0905   BILITOT 1.1 05/15/2014 0905   GFRNONAA 67* 03/03/2014 0330   GFRAA 77* 03/03/2014 0330   Lab Results  Component Value Date   CHOL 212* 05/15/2014   HDL 32.80* 05/15/2014   LDLCALC 143* 05/15/2014   LDLDIRECT 98.6 02/23/2010   TRIG 182.0* 05/15/2014   CHOLHDL 6 05/15/2014   Lab Results  Component Value Date   HGBA1C 7.6* 03/02/2014   Lab Results  Component Value Date   VITAMINB12 437 11/12/2011    ASSESSMENT AND PLAN  ARKISHA PENFOLD is a 80 y.o. female with long standing history of voice, and motor tics, in recent few years, she also developed neck jerking movement, most consistent with motor tics, there was also some left facial motor tics, there was also a component of cervical dystonia, she has mild left tilt, left shoulder elevation  she has tried and failed multiple different medications in the past, she also has past medical history of hypertension, diabetes, hyperlipidemia, coronary artery disease, status post CABG, pacemaker placement, not a good candidate for polypharmacy treatment  Under EMG guidance, 100 units of Botox was injected  Left sternocleidomastoid 30 units Left longissimus 10 Left Levator scapular 10  Right levator scapular 10 Right splenius capitis 40  Patient tolerate the injection well, will come back to clinic in 3 months for repeat injection   Marcial Pacas, M.D. Ph.D.  [facility] [address] [place], [place] [address] [phone]

## 2014-12-11 ENCOUNTER — Telehealth: Payer: Self-pay | Admitting: Neurology

## 2014-12-11 NOTE — Telephone Encounter (Signed)
Patient is calling because she had Botox last week and since yesterday patient's neck is really swollen very big. Is this normal? Can patient do anything to decrease the swelling? Please call and advise. Thank you.

## 2014-12-12 ENCOUNTER — Ambulatory Visit: Payer: Medicare Other | Admitting: Adult Health

## 2014-12-12 ENCOUNTER — Ambulatory Visit (INDEPENDENT_AMBULATORY_CARE_PROVIDER_SITE_OTHER): Payer: Self-pay | Admitting: Neurology

## 2014-12-12 ENCOUNTER — Encounter: Payer: Self-pay | Admitting: Neurology

## 2014-12-12 VITALS — BP 110/68 | HR 71 | Ht 62.0 in | Wt 176.0 lb

## 2014-12-12 DIAGNOSIS — F959 Tic disorder, unspecified: Secondary | ICD-10-CM

## 2014-12-12 DIAGNOSIS — G243 Spasmodic torticollis: Secondary | ICD-10-CM

## 2014-12-12 DIAGNOSIS — F951 Chronic motor or vocal tic disorder: Secondary | ICD-10-CM

## 2014-12-12 NOTE — Telephone Encounter (Signed)
I have called her, she complains of bilateral anterior neck swelling, symmetric since her BOTOX injection in Dec 30th.  Hinton Dyer, I have called her to come in to let me check on her today, let front desk know

## 2014-12-12 NOTE — Progress Notes (Signed)
Marland Kitchen    PATIENT: Janet Steele DOB: Apr 26, 1929  HISTORICAL  Janet Steele is a 81 years old right-handed Caucasian female, referred by Dr. Brett Fairy for evaluation of possible EMG guided Botox treatment for her abnormal facial movement, and neck jerking movement.  She had a past medical history of coronary artery disease, status post open heart surgery, pacemaker placement, hypertension, diabetes, hyperlipidemia, see sleep specialist to Dr. Brett Fairy for insomnia, which has improved with trazodone treatment  She reported a history of motor tics since teenager, she has intermittent body jerking movement, grunting sound, over the years, she has been very self-consciousness about her abnormal movement, she always trying to find backseat in the church.  Over the past few years, in addition to her abrupt body jerking movement, she also developed this neck jerking movement, usually, she has chronic neck jerking movement to her right side, she can temporarily suppress it, but always came back in event.  Over the years, she has tried different medications, see different specialists, including [facility] consultations, without improving her symptoms  UPDATE 12/04/2014: She came in for EMG guided Botox injection for the first time, potential side effect explained, including muscle weakness, distant spread, trouble swallowing, consent form was signed.  Update December 12 2014: She had her first EMG guided Botox injection in December 04 2014, used 100 units, 30 units was in left sternocleidomastoid, 70 units was injected into bilateral posterior cervical muscles,  She called office today, complains of bilateral anterior cervical region excessive tissues, swelling sensation, no swallowing difficulty, no double vision, no chewing difficulty,   REVIEW OF SYSTEMS: Full 14 system review of systems performed and notable only for as above  ALLERGIES: Allergies  Allergen Reactions  . Codeine Rash  .  Morphine Hives, Itching and Rash  . Penicillins Itching and Rash  . Prednisone Other (See Comments)    MEMORY LOSS   . Sulfonamide Derivatives Itching and Rash  . Hydrocodone-Acetaminophen Other (See Comments)    unknown  . Meperidine Hcl Itching  . Metoclopramide Hcl Itching and Rash  . Metoprolol Succinate Other (See Comments)    nervous  . Moxifloxacin Itching  . Oxycodone-Aspirin Rash  . Pentazocine Lactate Nausea Only  . Pioglitazone Other (See Comments)    bloating  . Tramadol Hcl Other (See Comments)    unknown    HOME MEDICATIONS: Current Outpatient Prescriptions on File Prior to Visit  Medication Sig Dispense Refill  . aspirin 325 MG EC tablet Take 325 mg by mouth daily.      Marland Kitchen atorvastatin (LIPITOR) 80 MG tablet Take 1 tablet (80 mg total) by mouth daily at 6 PM. 30 tablet 5  . DULoxetine (CYMBALTA) 60 MG capsule take 1 capsule by mouth once daily 30 capsule 2  . glimepiride (AMARYL) 2 MG tablet Take 2 mg by mouth daily before breakfast.     . isosorbide mononitrate (IMDUR) 60 MG 24 hr tablet Take 1 tablet (60 mg total) by mouth daily. 30 tablet 5  . levothyroxine (SYNTHROID, LEVOTHROID) 88 MCG tablet Take 88 mcg by mouth daily.    Marland Kitchen losartan (COZAAR) 50 MG tablet Take 1 tablet (50 mg total) by mouth daily. 30 tablet 5  . metFORMIN (GLUCOPHAGE) 500 MG tablet Take 1 tablet (500 mg total) by mouth daily with breakfast. 90 tablet 3  . nitroGLYCERIN (NITROSTAT) 0.4 MG SL tablet Place 1 tablet (0.4 mg total) under the tongue every 5 (five) minutes as needed. For chest pain 25 tablet 5  .  pantoprazole (PROTONIX) 40 MG tablet take 1 tablet by mouth once daily 30 tablet 3  . ranolazine (RANEXA) 500 MG 12 hr tablet Take 1 tablet (500 mg total) by mouth 2 (two) times daily. 60 tablet 5  . traMADol (ULTRAM) 50 MG tablet   0  . traZODone (DESYREL) 50 MG tablet Prn HS , 1.5 tab at night by mouth. 145 tablet 2   No current facility-administered medications on file prior to visit.     PAST MEDICAL HISTORY: Past Medical History  Diagnosis Date  . Hypothyroidism   . Hyperlipidemia   . GERD (gastroesophageal reflux disease)   . Anxiety   . Diabetes mellitus   . Hypertension   . PVD (peripheral vascular disease)   . Carotid bruit   . Other dysphagia   . Other esophagitis   . Hemorrhoid   . Other constipation   . Colon, diverticulosis   . Hiatal hernia   . Osteoarthrosis, unspecified whether generalized or localized, unspecified site   . Degenerative joint disease   . Esophageal stricture   . Unspecified hearing loss   . Diastolic dysfunction   . CAD (coronary artery disease)   . Shingles   . Nonspecific abnormal electrocardiogram (ECG) (EKG)   . Nonspecific abnormal electrocardiogram (ECG) (EKG)     PAST SURGICAL HISTORY: Past Surgical History  Procedure Laterality Date  . Appendectomy    . Cholecystectomy    . Abdominal hysterectomy    . Oophorectomy      bilateral  . Coronary artery bypass graft      x4(1982),02-2002 CABG X2  . Back surgery  01-2000  . Pacemaker insertion  07/06/12    MDT Adapta L implanted by Dr Rayann Heman for symptomatic bradycardia  . Permanent pacemaker insertion N/A 07/06/2012    Procedure: PERMANENT PACEMAKER INSERTION;  Surgeon: Thompson Grayer, MD;  Location: [facility] CATH LAB;  Service: Cardiovascular;  Laterality: N/A;  . Left heart catheterization with coronary/graft angiogram N/A 03/05/2014    Procedure: LEFT HEART CATHETERIZATION WITH Beatrix Fetters;  Surgeon: Sinclair Grooms, MD;  Location[facility] C[facility] LAB;  Service: Cardiovascular;  Laterality: N/A;    FAMILY HISTORY: Family History  Problem Relation Age of Onset  . Heart attack Father   . Heart disease Sister   . Colon cancer Neg Hx     SOCIAL HISTORY:  History   Social History  . Marital Status: Married    Spouse Name: [name]    Number of Children: 3  . Years of Education: 16   Occupational History  . Retired    .     Social History Main Topics  .  Smoking status: Former Smoker    Types: Cigarettes    Quit date: 07/05/1976  . Smokeless tobacco: Never Used     Comment: smoked age18- 1977, up to 1/2 ppd  . Alcohol Use: 2.4 oz/week    4 Glasses of wine per week  . Drug Use: No  . Sexual Activity: No   Other Topics Concern  . Not on file   Social History Narrative   Patient is married [name]) and lives at home with her husband.   Patient has three adult children.   Patient is retired.   Patient has a college education.   Patient is right-handed.   Patient does not drink any caffeine.     PHYSICAL EXAM   Filed Vitals:   12/12/14 1408  BP: 110/68  Pulse: 71  Height: 5\' 2"  (1.575 m)  Weight: 176 lb (79.833 kg)    Not recorded      Body mass index is 32.18 kg/(m^2).   Generalized: In no acute distress  Neck: Supple, no carotid bruits   Cardiac: Regular rate rhythm  Pulmonary: Clear to auscultation bilaterally  Musculoskeletal: No deformity,  Neurological examination  Mentation: Alert oriented to time, place, history taking, and causual conversation, she has intermittent grunting sound, body flexion quick jerking movement, frequent neck jerking movement to the right side, occasional left facial twist movement Redundant anterior cervical soft tissues  Cranial nerve II-XII: Pupils were equal round reactive to light. Extraocular movements were full.  Visual field were full on confrontational test. Bilateral fundi were sharp.  Facial sensation and strength were normal. Hearing was intact to finger rubbing bilaterally. Uvula tongue midline.  Head turning and shoulder shrug and were normal and symmetric.Tongue protrusion into cheek strength was normal.  Motor: Normal tone, bulk and strength.  Sensory: Intact to fine touch, pinprick, preserved vibratory sensation, and proprioception at toes.  Coordination: Normal finger to nose, heel-to-shin bilaterally there was no truncal ataxia  Gait: Rising up from seated  position without assistance, normal stance, without trunk ataxia, moderate stride, good arm swing, smooth turning,   Romberg signs: Negative  Deep tendon reflexes: Brachioradialis 2/2, biceps 2/2, triceps 2/2, patellar 2/2, Achilles 2/2, plantar responses were flexor bilaterally.   DIAGNOSTIC DATA (LABS, IMAGING, TESTING) - I reviewed patient records, labs, notes, testing and imaging myself where available.  Lab Results  Component Value Date   WBC 5.6 03/05/2014   HGB 13.8 03/05/2014   HCT 40.3 03/05/2014   MCV 84.5 03/05/2014   PLT 226 03/05/2014      Component Value Date/Time   NA 136* 03/03/2014 0330   K 4.2 03/03/2014 0330   CL 102 03/03/2014 0330   CO2 22 03/03/2014 0330   GLUCOSE 146* 03/03/2014 0330   BUN 11 03/03/2014 0330   CREATININE 0.80 03/03/2014 0330   CALCIUM 8.4 03/03/2014 0330   PROT 7.0 05/15/2014 0905   ALBUMIN 4.1 05/15/2014 0905   AST 21 05/15/2014 0905   ALT 20 05/15/2014 0905   ALKPHOS 77 05/15/2014 0905   BILITOT 1.1 05/15/2014 0905   GFRNONAA 67* 03/03/2014 0330   GFRAA 77* 03/03/2014 0330   Lab Results  Component Value Date   CHOL 212* 05/15/2014   HDL 32.80* 05/15/2014   LDLCALC 143* 05/15/2014   LDLDIRECT 98.6 02/23/2010   TRIG 182.0* 05/15/2014   CHOLHDL 6 05/15/2014   Lab Results  Component Value Date   HGBA1C 7.6* 03/02/2014   Lab Results  Component Value Date   VITAMINB12 437 11/12/2011    ASSESSMENT AND PLAN  ARSHIA NUNLEY is a 81 y.o. female with long standing history of voice, and motor tics, in recent few years, she also developed neck jerking movement, most consistent with motor tics, there was also some left facial motor tics, there was also a component of cervical dystonia, she has mild left tilt, left shoulder elevation  she has tried and failed multiple different medications in the past, she also has past medical history of hypertension, diabetes, hyperlipidemia, coronary artery disease, status post CABG, pacemaker  placement, not a good candidate for polypharmacy treatment  She received EMG guided Botox injection December 30 20 15, now complains of anterior cervical region swollen sensation, which is due to age related redundant tissue, there was no evidence of swallowing difficulty, to suggest side effect from Botox,  She is to return  to clinic in 3 months for repeat injection    Marcial Pacas, M.D. Ph.D.  [facility] [address] [place], [place] [address] [phone]

## 2014-12-12 NOTE — Telephone Encounter (Signed)
Will forward to Dr.Yan .

## 2014-12-12 NOTE — Telephone Encounter (Signed)
Done I will let front desk know.

## 2014-12-14 ENCOUNTER — Other Ambulatory Visit: Payer: Self-pay | Admitting: Cardiology

## 2015-01-03 ENCOUNTER — Ambulatory Visit: Payer: Self-pay | Admitting: Neurology

## 2015-01-08 ENCOUNTER — Ambulatory Visit: Payer: Self-pay | Admitting: Neurology

## 2015-01-09 DIAGNOSIS — E119 Type 2 diabetes mellitus without complications: Secondary | ICD-10-CM | POA: Diagnosis not present

## 2015-01-09 DIAGNOSIS — Z961 Presence of intraocular lens: Secondary | ICD-10-CM | POA: Diagnosis not present

## 2015-01-09 DIAGNOSIS — H35372 Puckering of macula, left eye: Secondary | ICD-10-CM | POA: Diagnosis not present

## 2015-01-09 DIAGNOSIS — H26493 Other secondary cataract, bilateral: Secondary | ICD-10-CM | POA: Diagnosis not present

## 2015-01-21 DIAGNOSIS — M4317 Spondylolisthesis, lumbosacral region: Secondary | ICD-10-CM | POA: Diagnosis not present

## 2015-01-23 ENCOUNTER — Other Ambulatory Visit: Payer: Self-pay | Admitting: Neurological Surgery

## 2015-01-23 DIAGNOSIS — M4317 Spondylolisthesis, lumbosacral region: Secondary | ICD-10-CM

## 2015-01-24 ENCOUNTER — Other Ambulatory Visit: Payer: Self-pay | Admitting: Neurological Surgery

## 2015-01-24 ENCOUNTER — Ambulatory Visit
Admission: RE | Admit: 2015-01-24 | Discharge: 2015-01-24 | Disposition: A | Discharge status: Home / Self Care | Payer: Medicare Other | Source: Ambulatory Visit | Attending: Neurological Surgery | Admitting: Neurological Surgery

## 2015-01-24 ENCOUNTER — Other Ambulatory Visit (HOSPITAL_COMMUNITY): Payer: Self-pay | Admitting: Cardiology

## 2015-01-24 DIAGNOSIS — M4317 Spondylolisthesis, lumbosacral region: Secondary | ICD-10-CM

## 2015-01-24 DIAGNOSIS — M545 Low back pain: Secondary | ICD-10-CM | POA: Diagnosis not present

## 2015-01-24 DIAGNOSIS — I6523 Occlusion and stenosis of bilateral carotid arteries: Secondary | ICD-10-CM

## 2015-01-24 MED ORDER — METHYLPREDNISOLONE ACETATE 40 MG/ML INJ SUSP (RADIOLOG
120.0000 mg | Freq: Once | INTRAMUSCULAR | Status: AC
  Administered 2015-01-24: 120 mg via EPIDURAL

## 2015-01-24 MED ORDER — IOHEXOL 180 MG/ML  SOLN
1.0000 mL | Freq: Once | INTRAMUSCULAR | Status: AC | PRN
  Administered 2015-01-24: 1 mL via EPIDURAL

## 2015-01-28 ENCOUNTER — Encounter: Payer: Self-pay | Admitting: *Deleted

## 2015-01-28 ENCOUNTER — Ambulatory Visit (HOSPITAL_COMMUNITY): Payer: Medicare Other | Attending: Cardiology | Admitting: *Deleted

## 2015-01-28 DIAGNOSIS — I6523 Occlusion and stenosis of bilateral carotid arteries: Secondary | ICD-10-CM | POA: Diagnosis present

## 2015-01-28 NOTE — Progress Notes (Signed)
Carotid Duplex Exam Performed 

## 2015-01-29 ENCOUNTER — Encounter: Payer: Self-pay | Admitting: Adult Health

## 2015-01-29 ENCOUNTER — Ambulatory Visit (INDEPENDENT_AMBULATORY_CARE_PROVIDER_SITE_OTHER): Payer: Medicare Other | Admitting: Adult Health

## 2015-01-29 VITALS — BP 115/73 | HR 80 | Ht 64.0 in | Wt 131.0 lb

## 2015-01-29 DIAGNOSIS — I6523 Occlusion and stenosis of bilateral carotid arteries: Secondary | ICD-10-CM

## 2015-01-29 DIAGNOSIS — F951 Chronic motor or vocal tic disorder: Secondary | ICD-10-CM | POA: Diagnosis not present

## 2015-01-29 DIAGNOSIS — G4701 Insomnia due to medical condition: Secondary | ICD-10-CM

## 2015-01-29 NOTE — Progress Notes (Signed)
PATIENT: Janet Steele DOB: 1929/03/17  REASON FOR VISIT: follow up- insomnia, chronic motor tic. HISTORY FROM: patient  HISTORY OF PRESENT ILLNESS: Janet Steele is a 81 year old female with a history of chronic insomnia and chronic motor tic. She returns today for follow-up. The patient is currently taking trazodone 50 mg at bedtime. She was advised that she could take an extra 25 mg as needed. Patient also has Belsomra 50 mg but states that did not work. She reports that her insomnia has been adequately treated with trazodone. She goes to bed around 11 PM and arises at 9 AM.  The patient does have a motor tic located in the neck and head. She sees Dr. Krista Blue for Botox treatments. Patient does have movements of the tongue and a constantly licking of her lips. Patient states that she started doing this about 6 months ago. She states that its due to dry mouth and chapped lips- she states that she can control it? Otherwise no new symptoms.   HISTORY 09/12/14: Janet Steele is a 81 y.o. caucasian, right handed, married female , who was seen here as a referral from Dr. Linna Darner for a sleep evaluation, Now would like to address her movement disorder. She has rhythmic neck movements that wake her from sleep. Her cat "has left the usual space on the pillow and is now at her hip, for the patient a sign that these movements have begun to bother her. The patient moves her head abrupt in a twitching movement to the left and right. No tilt , no flexion. Has some leg twitching, too.  She increased her trazodone to 50 mg at night after Belsomra did not work as well.  She wants to increase it further , but I think this may be the medication that makes her twitch. She is on Cymbalta, too. This medication mix and her history of CAD make a narrow choice of tic treatment medication . She cannot use ORAP, CLONIDINE. She may benefit from botox, yet her mouth will get even dryer.  REVIEW OF SYSTEMS: Out of a complete 14  system review of symptoms, the patient complains only of the following symptoms, and all other reviewed systems are negative.  Eye itching, bruise/bleed easily, anemia  ALLERGIES: Allergies  Allergen Reactions  . Codeine Rash  . Morphine Hives, Itching and Rash  . Penicillins Itching and Rash  . Prednisone Other (See Comments)    MEMORY LOSS   . Sulfonamide Derivatives Itching and Rash  . Hydrocodone-Acetaminophen Other (See Comments)    unknown  . Meperidine Hcl Itching  . Metoclopramide Hcl Itching and Rash  . Metoprolol Succinate Other (See Comments)    nervous  . Moxifloxacin Itching  . Oxycodone-Aspirin Rash  . Pentazocine Lactate Nausea Only  . Pioglitazone Other (See Comments)    bloating  . Tramadol Hcl Other (See Comments)    unknown    HOME MEDICATIONS: Outpatient Prescriptions Prior to Visit  Medication Sig Dispense Refill  . aspirin 325 MG EC tablet Take 325 mg by mouth daily.      Marland Kitchen atorvastatin (LIPITOR) 80 MG tablet Take 1 tablet (80 mg total) by mouth daily at 6 PM. 30 tablet 5  . DULoxetine (CYMBALTA) 60 MG capsule take 1 capsule by mouth once daily 30 capsule 2  . glimepiride (AMARYL) 2 MG tablet Take 2 mg by mouth daily before breakfast.     . isosorbide mononitrate (IMDUR) 120 MG 24 hr tablet take 1 tablet by  mouth once daily 30 tablet 5  . levothyroxine (SYNTHROID, LEVOTHROID) 88 MCG tablet Take 88 mcg by mouth daily.    Marland Kitchen losartan (COZAAR) 50 MG tablet Take 1 tablet (50 mg total) by mouth daily. 30 tablet 5  . metFORMIN (GLUCOPHAGE) 500 MG tablet Take 1 tablet (500 mg total) by mouth daily with breakfast. 90 tablet 3  . nitroGLYCERIN (NITROSTAT) 0.4 MG SL tablet Place 1 tablet (0.4 mg total) under the tongue every 5 (five) minutes as needed. For chest pain 25 tablet 5  . pantoprazole (PROTONIX) 40 MG tablet take 1 tablet by mouth once daily 30 tablet 3  . ranolazine (RANEXA) 500 MG 12 hr tablet Take 1 tablet (500 mg total) by mouth 2 (two) times daily.  60 tablet 5  . traMADol (ULTRAM) 50 MG tablet   0  . traZODone (DESYREL) 50 MG tablet Prn HS , 1.5 tab at night by mouth. 145 tablet 2   No facility-administered medications prior to visit.    PAST MEDICAL HISTORY: Past Medical History  Diagnosis Date  . Hypothyroidism   . Hyperlipidemia   . GERD (gastroesophageal reflux disease)   . Anxiety   . Diabetes mellitus   . Hypertension   . PVD (peripheral vascular disease)   . Carotid bruit   . Other dysphagia   . Other esophagitis   . Hemorrhoid   . Other constipation   . Colon, diverticulosis   . Hiatal hernia   . Osteoarthrosis, unspecified whether generalized or localized, unspecified site   . Degenerative joint disease   . Esophageal stricture   . Unspecified hearing loss   . Diastolic dysfunction   . CAD (coronary artery disease)   . Shingles   . Nonspecific abnormal electrocardiogram (ECG) (EKG)   . Nonspecific abnormal electrocardiogram (ECG) (EKG)     PAST SURGICAL HISTORY: Past Surgical History  Procedure Laterality Date  . Appendectomy    . Cholecystectomy    . Abdominal hysterectomy    . Oophorectomy      bilateral  . Coronary artery bypass graft      x4(1982),02-2002 CABG X2  . Back surgery  01-2000  . Pacemaker insertion  07/06/12    MDT Adapta L implanted by Dr Rayann Heman for symptomatic bradycardia  . Permanent pacemaker insertion N/A 07/06/2012    Procedure: PERMANENT PACEMAKER INSERTION;  Surgeon: Thompson Grayer, MD;  Location: [facility] CATH LAB;  Service: Cardiovascular;  Laterality: N/A;  . Left heart catheterization with coronary/graft angiogram N/A 03/05/2014    Procedure: LEFT HEART CATHETERIZATION WITH Beatrix Fetters;  Surgeon: Sinclair Grooms, MD;  Location: [facility] CATH LAB;  Service: Cardiovascular;  Laterality: N/A;    FAMILY HISTORY: Family History  Problem Relation Age of Onset  . Heart attack Father   . Heart disease Sister   . Colon cancer Neg Hx     SOCIAL HISTORY: History   Social  History  . Marital Status: Married    Spouse Name: [name]  . Number of Children: 3  . Years of Education: 16   Occupational History  . Retired    .     Social History Main Topics  . Smoking status: Former Smoker    Types: Cigarettes    Quit date: 07/05/1976  . Smokeless tobacco: Never Used     Comment: smoked age18- 1977, up to 1/2 ppd  . Alcohol Use: 2.4 oz/week    4 Glasses of wine per week  . Drug Use: No  . Sexual Activity:  No   Other Topics Concern  . Not on file   Social History Narrative   Patient is married [name]) and lives at home with her husband.   Patient has three adult children.   Patient is retired.   Patient has a college education.   Patient is right-handed.   Patient does not drink any caffeine.      PHYSICAL EXAM  Filed Vitals:   01/29/15 1328  BP: 115/73  Pulse: 80  Height: 5\' 4"  (1.626 m)  Weight: 131 lb (59.421 kg)   Body mass index is 22.47 kg/(m^2).  Generalized: Well developed, in no acute distress   Neurological examination  Mentation: Alert oriented to time, place, history taking. Follows all commands speech and language fluent Cranial nerve II-XII: Pupils were equal round reactive to light. Extraocular movements were full, visual field were full on confrontational test. Facial sensation and strength were normal. Uvula tongue midline. Head turning and shoulder shrug  were normal and symmetric. Constant licking of lips. Motor: The motor testing reveals 5 over 5 strength of all 4 extremities. Good symmetric motor tone is noted throughout.  Sensory: Sensory testing is intact to soft touch on all 4 extremities. No evidence of extinction is noted.  Coordination: Cerebellar testing reveals good finger-nose-finger and heel-to-shin bilaterally.  Gait and station: Gait is normal. Tandem gait is slightly unsteady. Romberg is negative. No drift is seen.  Reflexes: Deep tendon reflexes are symmetric and normal bilaterally.  Marland Kitchen   DIAGNOSTIC  DATA (LABS, IMAGING, TESTING) - I reviewed patient records, labs, notes, testing and imaging myself where available.      ASSESSMENT AND PLAN 81 y.o. year old female  has a past medical history of Hypothyroidism; Hyperlipidemia; GERD (gastroesophageal reflux disease); Anxiety; Diabetes mellitus; Hypertension; PVD (peripheral vascular disease); Carotid bruit; Other dysphagia; Other esophagitis; Hemorrhoid; Other constipation; Colon, diverticulosis; Hiatal hernia; Osteoarthrosis, unspecified whether generalized or localized, unspecified site; Degenerative joint disease; Esophageal stricture; Unspecified hearing loss; Diastolic dysfunction; CAD (coronary artery disease); Shingles; Nonspecific abnormal electrocardiogram (ECG) (EKG); and Nonspecific abnormal electrocardiogram (ECG) (EKG). here with:  1. Chronic insomnia 2. Motor tic  The patient is doing well on trazodone. She states her insomnia has been controlled. She did not feel that the Belsomra was beneficial. She has stopped taking this. The patient will let us know if her symptoms worsen or she develops new symptoms. She follows up with Dr. Krista Blue for motor tic and receives Botox injections. She will follow-up in 6 months or sooner if needed.  Ward Givens, MSN, NP-C 01/29/2015, 1:28 PM [facility] [address], [place], [place] [address] [phone]  Note: This document was prepared with digital dictation and possible smart phrase technology. Any transcriptional errors that result from this process are unintentional.

## 2015-01-29 NOTE — Patient Instructions (Signed)
Continue Trazodone.  Follow up with Dr. Krista Blue regarding botox for motor tic

## 2015-01-29 NOTE — Progress Notes (Signed)
I agree with the assessment and plan as directed by NP .The patient is known to me .   Millisa Giarrusso, MD  

## 2015-02-04 ENCOUNTER — Other Ambulatory Visit: Payer: Self-pay | Admitting: Internal Medicine

## 2015-02-04 NOTE — Telephone Encounter (Signed)
OK X 3 mos 

## 2015-02-07 ENCOUNTER — Encounter: Payer: Self-pay | Admitting: Cardiovascular Disease

## 2015-02-07 ENCOUNTER — Ambulatory Visit (INDEPENDENT_AMBULATORY_CARE_PROVIDER_SITE_OTHER): Payer: Medicare Other | Admitting: Cardiovascular Disease

## 2015-02-07 VITALS — BP 102/68 | HR 64 | Ht 64.0 in | Wt 130.0 lb

## 2015-02-07 DIAGNOSIS — I208 Other forms of angina pectoris: Secondary | ICD-10-CM | POA: Diagnosis not present

## 2015-02-07 MED ORDER — RANOLAZINE ER 1000 MG PO TB12: 1000.0000 mg | ORAL_TABLET | Freq: Two times a day (BID) | ORAL | Status: DC

## 2015-02-07 NOTE — Progress Notes (Signed)
Cardiology Office Note   Date:  02/07/2015   ID:  Janet Steele, DOB 01/08/1929, MRN [id number]  PCP:  Unice Cobble, MD  Cardiologist:  Sherren Mocha, MD    No chief complaint on file.    History of Present Illness: Janet Steele is a 81 y.o. female who presents for follow-up of coronary artery disease.  She has a history of extensive CAD status post redo CABG, initially in 1982 and most recent surgery in 2003. She most recently presented with non-STEMI in April of this year. She underwent cardiac catheterization demonstrating occlusion of her native coronary arteries with continued patency of the LIMA to LAD and saphenous vein graft to PDA. There is diffuse stenosis between the PDA and the 2 LV branches of the distal right coronary artery. There were collaterals supply the left circumflex distribution. There were no targets amenable to revascularization. Since that time, she has been managed with aspirin, Imdur, atorvastatin and Cozaar. She is not on beta blocker therapy due to allergy/intolerance. When she was seen last, Ranexa was added to her medical program for further treatment of chest pain.  The patient returns today. She continues to have symptoms of angina with exertion. She describes substernal chest pressure with climbing stairs. She has to sit down and rest and her symptoms resolved over several minutes. She takes nitroglycerin intermittently. States that some week she takes none at all and other times she has to take it more frequently. There is associated shortness of breath. She denies lightheadedness or syncope. She reports no other changes.  The patient has chronic low back problems. She has been evaluated by Dr. Ellene Route. He has concerns about progression of her lumbar spine disease, but the patient tells me today she absolutely will not have surgery. She has had some recent spinal injections at symptoms are improved.   Past Medical History  Diagnosis Date  .  Hypothyroidism   . Hyperlipidemia   . GERD (gastroesophageal reflux disease)   . Anxiety   . Diabetes mellitus   . Hypertension   . PVD (peripheral vascular disease)   . Carotid bruit   . Other dysphagia   . Other esophagitis   . Hemorrhoid   . Other constipation   . Colon, diverticulosis   . Hiatal hernia   . Osteoarthrosis, unspecified whether generalized or localized, unspecified site   . Degenerative joint disease   . Esophageal stricture   . Unspecified hearing loss   . Diastolic dysfunction   . CAD (coronary artery disease)   . Shingles   . Nonspecific abnormal electrocardiogram (ECG) (EKG)   . Nonspecific abnormal electrocardiogram (ECG) (EKG)     Past Surgical History  Procedure Laterality Date  . Appendectomy    . Cholecystectomy    . Abdominal hysterectomy    . Oophorectomy      bilateral  . Coronary artery bypass graft      x4(1982),02-2002 CABG X2  . Back surgery  01-2000  . Pacemaker insertion  07/06/12    MDT Adapta L implanted by Dr Rayann Heman for symptomatic bradycardia  . Permanent pacemaker insertion N/A 07/06/2012    Procedure: PERMANENT PACEMAKER INSERTION;  Surgeon: Thompson Grayer, MD;  Location: [facility] CATH LAB;  Service: Cardiovascular;  Laterality: N/A;  . Left heart catheterization with coronary/graft angiogram N/A 03/05/2014    Procedure: LEFT HEART CATHETERIZATION WITH Beatrix Fetters;  Surgeon: Sinclair Grooms, MD;  Location: [facility] CATH LAB;  Service: Cardiovascular;  Laterality: N/A;  Current Outpatient Prescriptions  Medication Sig Dispense Refill  . aspirin 325 MG EC tablet Take 325 mg by mouth daily.      Marland Kitchen atorvastatin (LIPITOR) 80 MG tablet Take 1 tablet (80 mg total) by mouth daily at 6 PM. 30 tablet 5  . DULoxetine (CYMBALTA) 60 MG capsule take 1 capsule by mouth once daily 30 capsule 2  . glimepiride (AMARYL) 2 MG tablet Take 2 mg by mouth daily before breakfast.     . isosorbide mononitrate (IMDUR) 120 MG 24 hr tablet take 1 tablet by  mouth once daily 30 tablet 5  . levothyroxine (SYNTHROID, LEVOTHROID) 88 MCG tablet Take 88 mcg by mouth daily.    Marland Kitchen losartan (COZAAR) 50 MG tablet Take 1 tablet (50 mg total) by mouth daily. 30 tablet 5  . metFORMIN (GLUCOPHAGE) 500 MG tablet Take 1 tablet (500 mg total) by mouth daily with breakfast. 90 tablet 3  . nitroGLYCERIN (NITROSTAT) 0.4 MG SL tablet Place 1 tablet (0.4 mg total) under the tongue every 5 (five) minutes as needed. For chest pain 25 tablet 5  . pantoprazole (PROTONIX) 40 MG tablet take 1 tablet by mouth once daily 30 tablet 3  . ranolazine (RANEXA) 1000 MG SR tablet Take 1 tablet (1,000 mg total) by mouth 2 (two) times daily. 30 tablet 11  . traMADol (ULTRAM) 50 MG tablet Take 50 mg by mouth every 6 (six) hours as needed for moderate pain (back pain).   0  . traZODone (DESYREL) 50 MG tablet Prn HS , 1.5 tab at night by mouth. 145 tablet 2   No current facility-administered medications for this visit.    Allergies:   Codeine; Morphine; Penicillins; Prednisone; Sulfonamide derivatives; Hydrocodone-acetaminophen; Meperidine hcl; Metoclopramide hcl; Metoprolol succinate; Moxifloxacin; Oxycodone-aspirin; Pentazocine lactate; Pioglitazone; and Tramadol hcl   Social History:  The patient  reports that she quit smoking about 38 years ago. Her smoking use included Cigarettes. She has never used smokeless tobacco. She reports that she drinks about 2.4 oz of alcohol per week. She reports that she does not use illicit drugs.   Family History:  The patient's family history includes Heart attack in her father; Heart disease in her sister. There is no history of Colon cancer.    ROS:  Please see the history of present illness.  Positive for back pain. Otherwise, review of systems is negative.   PHYSICAL EXAM: VS:  BP 102/68 mmHg  Pulse 64  Ht 5\' 4"  (1.626 m)  Wt 130 lb (58.968 kg)  BMI 22.30 kg/m2  SpO2 96% , BMI Body mass index is 22.3 kg/(m^2). GEN: Well nourished, well  developed, in no acute distress HEENT: normal Neck: no JVD, no masses, no carotid bruits Cardiac: RRR with a soft systolic ejection murmur at the left lower sternal border            Respiratory:  clear to auscultation bilaterally, normal work of breathing GI: soft, nontender, nondistended, + BS MS: no deformity or atrophy Ext: no pretibial edema Skin: warm and dry, no rash Neuro:  Strength and sensation are intact Psych: euthymic mood, full affect  EKG:  EKG is not ordered today  Recent Labs: 03/03/2014: BUN 11; Creatinine 0.80; Potassium 4.2; Sodium 136* 03/05/2014: Hemoglobin 13.8; Platelets 226 05/15/2014: ALT 20   Lipid Panel     Component Value Date/Time   CHOL 212* 05/15/2014 0905   TRIG 182.0* 05/15/2014 0905   HDL 32.80* 05/15/2014 0905   CHOLHDL 6 05/15/2014 [id number]  VLDL 36.4 05/15/2014 0905   LDLCALC 143* 05/15/2014 0905   LDLDIRECT 98.6 02/23/2010 1255      Wt Readings from Last 3 Encounters:  02/07/15 130 lb (58.968 kg)  01/29/15 131 lb (59.421 kg)  12/12/14 176 lb (79.833 kg)    ASSESSMENT AND PLAN: Coronary artery disease, native vessel, with CCS class III exertional angina. I reviewed her medical program today carefully. I also reviewed her cardiac catheterization images and report. Unfortunately, I agree that there are no areas treated with PCI. The patient has already had 2 coronary bypass surgeries. Her blood pressure is relatively low and I do not think she would tolerate the addition of amlodipine to her medical regimen. I recommended that she increase ranolazine to 1000 mg twice daily. She will continue on high-dose isosorbide 120 mg daily. I will see her back in 3 months for follow-up evaluation. She will call if her anginal symptoms worsen.  Current medicines are reviewed with the patient today.  The patient does not have concerns regarding medicines.  The following changes have been made:  Increase Ranexa 1000 mg twice daily  Labs/ tests ordered today  include:  No orders of the defined types were placed in this encounter.    Disposition:   FU 3 months  Signed, Sherren Mocha, MD  02/07/2015 2:10 PM    [facility] [place], [place], [place]  [address] Phone: [phone]; Fax: [phone]

## 2015-02-07 NOTE — Patient Instructions (Addendum)
Your physician has recommended you make the following change in your medication:  1) Increase RANEXA to 1000mg  tablet twice a day  Your physician recommends that you schedule a follow-up appointment in: 3 months with Dr. Burt Knack.

## 2015-02-19 ENCOUNTER — Telehealth: Payer: Self-pay | Admitting: Cardiovascular Disease

## 2015-02-19 MED ORDER — NITROGLYCERIN 0.4 MG SL SUBL: 0.4000 mg | SUBLINGUAL_TABLET | SUBLINGUAL | Status: DC | PRN

## 2015-02-19 MED ORDER — RANOLAZINE ER 500 MG PO TB12
500.0000 mg | ORAL_TABLET | Freq: Two times a day (BID) | ORAL | Status: DC
Start: 2015-02-19 — End: 2015-03-11

## 2015-02-19 NOTE — Telephone Encounter (Signed)
New message      Pt c/o medication issue:  1. Name of Medication: ranexa 2. How are you currently taking this medication (dosage and times per day)? 1000 mg bid 3. Are you having a reaction (difficulty breathing--STAT)? no  4. What is your medication issue? Pt only took 1 pill---felt nausea, vomited and very weak----pt states she is not taking another pill.  Please call

## 2015-02-19 NOTE — Telephone Encounter (Signed)
I spoke with the pt and she has not been able to tolerate the Ranexa 1000mg  twice a day due to nausea and vomiting. The pt would like to go back to Ranexa 500mg  twice a day. The pt currently has a supply of this medication at home (medication list updated). The pt will call the office with any other questions or concerns.

## 2015-02-24 ENCOUNTER — Other Ambulatory Visit: Payer: Self-pay

## 2015-02-24 DIAGNOSIS — I10 Essential (primary) hypertension: Secondary | ICD-10-CM

## 2015-02-24 MED ORDER — LOSARTAN POTASSIUM 50 MG PO TABS: 50.0000 mg | ORAL_TABLET | Freq: Every day | ORAL | Status: DC

## 2015-02-25 ENCOUNTER — Other Ambulatory Visit: Payer: Self-pay

## 2015-02-26 ENCOUNTER — Encounter: Payer: Self-pay | Admitting: Internal Medicine

## 2015-02-26 ENCOUNTER — Ambulatory Visit (INDEPENDENT_AMBULATORY_CARE_PROVIDER_SITE_OTHER): Payer: Medicare Other | Admitting: Internal Medicine

## 2015-02-26 VITALS — BP 124/68 | HR 82 | Ht 61.0 in | Wt 124.0 lb

## 2015-02-26 DIAGNOSIS — I208 Other forms of angina pectoris: Secondary | ICD-10-CM | POA: Diagnosis not present

## 2015-02-26 DIAGNOSIS — R001 Bradycardia, unspecified: Secondary | ICD-10-CM

## 2015-02-26 DIAGNOSIS — I495 Sick sinus syndrome: Secondary | ICD-10-CM | POA: Diagnosis not present

## 2015-02-26 DIAGNOSIS — I25810 Atherosclerosis of coronary artery bypass graft(s) without angina pectoris: Secondary | ICD-10-CM | POA: Diagnosis not present

## 2015-02-26 LAB — MDC_IDC_ENUM_SESS_TYPE_INCLINIC
Battery Impedance: 133 Ohm
Battery Remaining Longevity: 132 mo
Battery Voltage: 2.79 V
Brady Statistic AP VP Percent: 0 %
Brady Statistic AP VS Percent: 86 %
Brady Statistic AS VP Percent: 0 %
Brady Statistic AS VS Percent: 14 %
Lead Channel Impedance Value: 578 Ohm
Lead Channel Pacing Threshold Amplitude: 0.75 V
Lead Channel Pacing Threshold Amplitude: 0.75 V
Lead Channel Pacing Threshold Pulse Width: 0.4 ms
Lead Channel Pacing Threshold Pulse Width: 0.4 ms
Lead Channel Sensing Intrinsic Amplitude: 2.8 mV
Lead Channel Sensing Intrinsic Amplitude: 22.4 mV
Lead Channel Setting Pacing Amplitude: 2 V
Lead Channel Setting Pacing Amplitude: 2.5 V
Lead Channel Setting Sensing Sensitivity: 5.6 mV
MDC IDC MSMT LEADCHNL RA IMPEDANCE VALUE: 427 Ohm
MDC IDC SESS DTM: 20160323153625
MDC IDC SET LEADCHNL RV PACING PULSEWIDTH: 0.4 ms

## 2015-02-26 NOTE — Patient Instructions (Signed)
Your physician wants you to follow-up in: 12 months with Chanetta Marshall, NP You will receive a reminder letter in the mail two months in advance. If you don't receive a letter, please call our office to schedule the follow-up appointment.   Remote monitoring is used to monitor your Pacemaker or ICD from home. This monitoring reduces the number of office visits required to check your device to one time per year. It allows [place] to keep an eye on the functioning of your device to ensure it is working properly. You are scheduled for a device check from home on 05/28/15. You may send your transmission at any time that day. If you have a wireless device, the transmission will be sent automatically. After your physician reviews your transmission, you will receive a postcard with your next transmission date.

## 2015-02-26 NOTE — Progress Notes (Signed)
Electrophysiology Office Note   Date:  02/26/2015   ID:  Janet Steele, DOB [birth year], MRN [id number]  PCP:  Unice Cobble, MD  Cardiologist:  Dr Burt Knack Primary Electrophysiologist: Thompson Grayer, MD    Chief Complaint  Patient presents with  . Follow-up    Bradycardia     History of Present Illness: Janet Steele is a 81 y.o. female who presents today for electrophysiology evaluation.   She is doing very well.  She had chest pain when she saw Dr Burt Knack last.  He increased her ranexa however she did not tolerate.  Presently, her angina is well controlled.  She remains active for her age. Today, she denies symptoms of palpitations, shortness of breath, orthopnea, PND, lower extremity edema, claudication, dizziness, presyncope, syncope, bleeding, or neurologic sequela. The patient is tolerating medications without difficulties and is otherwise without complaint today.    Past Medical History  Diagnosis Date  . Hypothyroidism   . Hyperlipidemia   . GERD (gastroesophageal reflux disease)   . Anxiety   . Diabetes mellitus   . Hypertension   . PVD (peripheral vascular disease)   . Carotid bruit   . Other dysphagia   . Other esophagitis   . Hemorrhoid   . Other constipation   . Colon, diverticulosis   . Hiatal hernia   . Osteoarthrosis, unspecified whether generalized or localized, unspecified site   . Degenerative joint disease   . Esophageal stricture   . Unspecified hearing loss   . Diastolic dysfunction   . CAD (coronary artery disease)   . Shingles   . Nonspecific abnormal electrocardiogram (ECG) (EKG)   . Nonspecific abnormal electrocardiogram (ECG) (EKG)    Past Surgical History  Procedure Laterality Date  . Appendectomy    . Cholecystectomy    . Abdominal hysterectomy    . Oophorectomy      bilateral  . Coronary artery bypass graft      x4(1982),02-2002 CABG X2  . Back surgery  01-2000  . Pacemaker insertion  07/06/12    MDT Adapta L implanted by Dr Rayann Heman  for symptomatic bradycardia  . Permanent pacemaker insertion N/A 07/06/2012    Procedure: PERMANENT PACEMAKER INSERTION;  Surgeon: Thompson Grayer, MD;  Location: [facility] CATH LAB;  Service: Cardiovascular;  Laterality: N/A;  . Left heart catheterization with coronary/graft angiogram N/A 03/05/2014    Procedure: LEFT HEART CATHETERIZATION WITH Beatrix Fetters;  Surgeon: Sinclair Grooms, MD;  Location: [facility] CATH LAB;  Service: Cardiovascular;  Laterality: N/A;     Current Outpatient Prescriptions  Medication Sig Dispense Refill  . aspirin 325 MG EC tablet Take 325 mg by mouth daily.      Marland Kitchen atorvastatin (LIPITOR) 80 MG tablet Take 1 tablet (80 mg total) by mouth daily at 6 PM. 30 tablet 5  . DULoxetine (CYMBALTA) 60 MG capsule take 1 capsule by mouth once daily 30 capsule 2  . glimepiride (AMARYL) 2 MG tablet Take 2 mg by mouth daily before breakfast.     . isosorbide mononitrate (IMDUR) 120 MG 24 hr tablet take 1 tablet by mouth once daily 30 tablet 5  . levothyroxine (SYNTHROID, LEVOTHROID) 75 MCG tablet Take 1 tablet by mouth daily.  0  . losartan (COZAAR) 50 MG tablet Take 1 tablet (50 mg total) by mouth daily. 30 tablet 5  . metFORMIN (GLUCOPHAGE) 500 MG tablet Take 1 tablet (500 mg total) by mouth daily with breakfast. 90 tablet 3  . nitroGLYCERIN (NITROSTAT) 0.4 MG SL  tablet Place 1 tablet (0.4 mg total) under the tongue every 5 (five) minutes as needed. For chest pain 25 tablet 5  . pantoprazole (PROTONIX) 40 MG tablet take 1 tablet by mouth once daily 30 tablet 3  . ranolazine (RANEXA) 500 MG 12 hr tablet Take 1 tablet (500 mg total) by mouth 2 (two) times daily.    . traMADol (ULTRAM) 50 MG tablet Take 50 mg by mouth every 6 (six) hours as needed for moderate pain (back pain).   0  . traZODone (DESYREL) 50 MG tablet Take 75 mg by mouth at bedtime as needed for sleep.     No current facility-administered medications for this visit.    Allergies:   Codeine; Morphine; Penicillins;  Prednisone; Sulfonamide derivatives; Hydrocodone-acetaminophen; Meperidine hcl; Metoclopramide hcl; Metoprolol succinate; Moxifloxacin; Oxycodone-aspirin; Pentazocine lactate; Pioglitazone; and Tramadol hcl   Social History:  The patient  reports that she quit smoking about 38 years ago. Her smoking use included Cigarettes. She has never used smokeless tobacco. She reports that she drinks about 2.4 oz of alcohol per week. She reports that she does not use illicit drugs.   Family History:  The patient's family history includes Heart attack in her father; Heart disease in her sister. There is no history of Colon cancer.    ROS:  Please see the history of present illness.   All other systems are reviewed and negative.    PHYSICAL EXAM: VS:  BP 124/68 mmHg  Pulse 82  Ht 5\' 1"  (1.549 m)  Wt 124 lb (56.246 kg)  BMI 23.44 kg/m2 , BMI Body mass index is 23.44 kg/(m^2). GEN: Well nourished, well developed, in no acute distress HEENT: normal Neck: no JVD, carotid bruits, or masses Cardiac: RRR; no murmurs, rubs, or gallops,no edema  Respiratory:  clear to auscultation bilaterally, normal work of breathing GI: soft, nontender, nondistended, + BS MS: no deformity or atrophy Skin: warm and dry, device pocket is well healed Neuro:  Strength and sensation are intact Psych: euthymic mood, full affect  Device interrogation is reviewed today in detail.  See PaceArt for details.   Recent Labs: 03/03/2014: BUN 11; Creatinine 0.80; Potassium 4.2; Sodium 136* 03/05/2014: Hemoglobin 13.8; Platelets 226 05/15/2014: ALT 20    Lipid Panel     Component Value Date/Time   CHOL 212* 05/15/2014 0905   TRIG 182.0* 05/15/2014 0905   HDL 32.80* 05/15/2014 0905   CHOLHDL 6 05/15/2014 0905   VLDL 36.4 05/15/2014 0905   LDLCALC 143* 05/15/2014 0905   LDLDIRECT 98.6 02/23/2010 1255     Wt Readings from Last 3 Encounters:  02/26/15 124 lb (56.246 kg)  02/07/15 130 lb (58.968 kg)  01/29/15 131 lb (59.421  kg)     ASSESSMENT AND PLAN:  1.  Sick sinus syndrome Normal pacemaker function See Pace Art report No changes today  2. CAD No ischemic symptoms  carelink  Current medicines are reviewed at length with the patient today.   The patient does not have concerns regarding her medicines.  The following changes were made today:  none   Follow-up with EP NP in 1 year  Signed, Thompson Grayer, MD  02/26/2015 12:47 PM     [address] [place] [id number] [phone] (office) [phone] (fax)

## 2015-03-11 ENCOUNTER — Telehealth: Payer: Self-pay | Admitting: Cardiovascular Disease

## 2015-03-11 MED ORDER — RANOLAZINE ER 500 MG PO TB12: 500.0000 mg | ORAL_TABLET | Freq: Two times a day (BID) | ORAL | Status: DC

## 2015-03-11 NOTE — Telephone Encounter (Signed)
New problem   Pt stated the nurse was suppose to call her when her Ranexa 500mg  is finished. Pt is calling in today because she has no pills left and need nurse to call her.

## 2015-03-11 NOTE — Telephone Encounter (Signed)
I called the pt's home and left a message on voicemail that I have sent a Rx to her pharmacy for Ranexa.  I advised that she contact the office with any other questions or concerns.

## 2015-03-12 ENCOUNTER — Encounter: Payer: Self-pay | Admitting: Neurology

## 2015-03-12 ENCOUNTER — Ambulatory Visit (INDEPENDENT_AMBULATORY_CARE_PROVIDER_SITE_OTHER): Payer: Medicare Other | Admitting: Neurology

## 2015-03-12 VITALS — BP 123/73 | HR 85 | Ht 61.0 in | Wt 131.0 lb

## 2015-03-12 DIAGNOSIS — G4701 Insomnia due to medical condition: Secondary | ICD-10-CM

## 2015-03-12 DIAGNOSIS — I208 Other forms of angina pectoris: Secondary | ICD-10-CM | POA: Diagnosis not present

## 2015-03-12 DIAGNOSIS — F959 Tic disorder, unspecified: Secondary | ICD-10-CM | POA: Diagnosis not present

## 2015-03-12 NOTE — Progress Notes (Signed)
Marland Kitchen    PATIENT: Janet Steele DOB: 1929/09/01  HISTORICAL  KALEISHA JUREWICZ is a 81 years old right-handed Caucasian female, referred by Dr. Brett Fairy for evaluation of possible EMG guided Botox treatment for her abnormal facial movement, and neck jerking movement.  She had a past medical history of coronary artery disease, status post open heart surgery, pacemaker placement, hypertension, diabetes, hyperlipidemia, see sleep specialist to Dr. Brett Fairy for insomnia, which has improved with trazodone treatment  She reported a history of motor tics since teenager, she has intermittent body jerking movement, grunting sound, over the years, she has been very self-consciousness about her abnormal movement, she always trying to find backseat in the church.  Over the past few years, in addition to her abrupt body jerking movement, she also developed this neck jerking movement, usually, she has chronic neck jerking movement to her right side, she can temporarily suppress it, but always came back in event.  Over the years, she has tried different medications, see different specialists, including [facility] consultations, without improving her symptoms  UPDATE 12/04/2014: She came in for EMG guided Botox injection for the first time, potential side effect explained, including muscle weakness, distant spread, trouble swallowing, consent form was signed.  Update December 12 2014: She had her first EMG guided Botox injection in December 04 2014, used 100 units, 30 units was in left sternocleidomastoid, 70 units was injected into bilateral posterior cervical muscles,  She called office today, complains of bilateral anterior cervical region excessive tissues, swelling sensation, no swallowing difficulty, no double vision, no chewing difficulty,  UPDATE April 6th 2016:  Her only EMG guided BOTOX injection in Dec 2015 did not help her at all, she has difficulty moving her neck, has to use hot pad,   She has  no trouble walking,  chewing, or swallowing.  REVIEW OF SYSTEMS: Full 14 system review of systems performed and notable only for as above  ALLERGIES: Allergies  Allergen Reactions  . Codeine Rash  . Morphine Hives, Itching and Rash  . Penicillins Itching and Rash  . Prednisone Other (See Comments)    MEMORY LOSS   . Sulfonamide Derivatives Itching and Rash  . Hydrocodone-Acetaminophen Other (See Comments)    unknown  . Meperidine Hcl Itching  . Metoclopramide Hcl Itching and Rash  . Metoprolol Succinate Other (See Comments)    nervous  . Moxifloxacin Itching  . Oxycodone-Aspirin Rash  . Pentazocine Lactate Nausea Only  . Pioglitazone Other (See Comments)    bloating  . Tramadol Hcl Other (See Comments)    Unknown, patient says she tolerates medication with no problems. 02/07/2015 TW CMA     HOME MEDICATIONS: Current Outpatient Prescriptions on File Prior to Visit  Medication Sig Dispense Refill  . aspirin 325 MG EC tablet Take 325 mg by mouth daily.      Marland Kitchen atorvastatin (LIPITOR) 80 MG tablet Take 1 tablet (80 mg total) by mouth daily at 6 PM. 30 tablet 5  . DULoxetine (CYMBALTA) 60 MG capsule take 1 capsule by mouth once daily 30 capsule 2  . glimepiride (AMARYL) 2 MG tablet Take 2 mg by mouth daily before breakfast.     . isosorbide mononitrate (IMDUR) 120 MG 24 hr tablet take 1 tablet by mouth once daily 30 tablet 5  . levothyroxine (SYNTHROID, LEVOTHROID) 75 MCG tablet Take 1 tablet by mouth daily.  0  . losartan (COZAAR) 50 MG tablet Take 1 tablet (50 mg total) by mouth daily. 30 tablet  5  . metFORMIN (GLUCOPHAGE) 500 MG tablet Take 1 tablet (500 mg total) by mouth daily with breakfast. 90 tablet 3  . nitroGLYCERIN (NITROSTAT) 0.4 MG SL tablet Place 1 tablet (0.4 mg total) under the tongue every 5 (five) minutes as needed. For chest pain 25 tablet 5  . pantoprazole (PROTONIX) 40 MG tablet take 1 tablet by mouth once daily 30 tablet 3  . ranolazine (RANEXA) 500 MG 12 hr  tablet Take 1 tablet (500 mg total) by mouth 2 (two) times daily. 60 tablet 11  . traMADol (ULTRAM) 50 MG tablet Take 50 mg by mouth every 6 (six) hours as needed for moderate pain (back pain).   0  . traZODone (DESYREL) 50 MG tablet Take 75 mg by mouth at bedtime as needed for sleep.     No current facility-administered medications on file prior to visit.    PAST MEDICAL HISTORY: Past Medical History  Diagnosis Date  . Hypothyroidism   . Hyperlipidemia   . GERD (gastroesophageal reflux disease)   . Anxiety   . Diabetes mellitus   . Hypertension   . PVD (peripheral vascular disease)   . Carotid bruit   . Other dysphagia   . Other esophagitis   . Hemorrhoid   . Other constipation   . Colon, diverticulosis   . Hiatal hernia   . Osteoarthrosis, unspecified whether generalized or localized, unspecified site   . Degenerative joint disease   . Esophageal stricture   . Unspecified hearing loss   . Diastolic dysfunction   . CAD (coronary artery disease)   . Shingles   . Nonspecific abnormal electrocardiogram (ECG) (EKG)   . Nonspecific abnormal electrocardiogram (ECG) (EKG)     PAST SURGICAL HISTORY: Past Surgical History  Procedure Laterality Date  . Appendectomy    . Cholecystectomy    . Abdominal hysterectomy    . Oophorectomy      bilateral  . Coronary artery bypass graft      x4(1982),02-2002 CABG X2  . Back surgery  01-2000  . Pacemaker insertion  07/06/12    MDT Adapta L implanted by Dr Rayann Heman for symptomatic bradycardia  . Permanent pacemaker insertion N/A 07/06/2012    Procedure: PERMANENT PACEMAKER INSERTION;  Surgeon: Thompson Grayer, MD;  Location: [facility] CATH LAB;  Service: Cardiovascular;  Laterality: N/A;  . Left heart catheterization with coronary/graft angiogram N/A 03/05/2014    Procedure: LEFT HEART CATHETERIZATION WITH Beatrix Fetters;  Surgeon: Sinclair Grooms, MD;  Location: [facility] CATH LAB;  Service: Cardiovascular;  Laterality: N/A;    FAMILY  HISTORY: Family History  Problem Relation Age of Onset  . Heart attack Father   . Heart disease Sister   . Colon cancer Neg Hx     SOCIAL HISTORY:  History   Social History  . Marital Status: Married    Spouse Name: [name]  . Number of Children: 3  . Years of Education: 16   Occupational History  . Retired    .     Social History Main Topics  . Smoking status: Former Smoker    Types: Cigarettes    Quit date: 07/05/1976  . Smokeless tobacco: Never Used     Comment: smoked age18- 1977, up to 1/2 ppd  . Alcohol Use: 2.4 oz/week    4 Glasses of wine per week  . Drug Use: No  . Sexual Activity: No   Other Topics Concern  . Not on file   Social History Narrative   Patient  is married [name]) and lives at home with her husband.   Patient has three adult children.   Patient is retired.   Patient has a college education.   Patient is right-handed.   Patient does not drink any caffeine.     PHYSICAL EXAM   Filed Vitals:   03/12/15 1342  BP: 123/73  Pulse: 85  Height: 5\' 1"  (1.549 m)  Weight: 131 lb (59.421 kg)    Not recorded      Body mass index is 24.76 kg/(m^2).   Generalized: In no acute distress  Neck: Supple, no carotid bruits   Cardiac: Regular rate rhythm  Pulmonary: Clear to auscultation bilaterally  Musculoskeletal: No deformity,  Neurological examination  Mentation: Alert oriented to time, place, history taking, and causual conversation, she has intermittent grunting sound, body flexion quick jerking movement, frequent neck jerking movement to the right side, occasional left facial twist movement Redundant anterior cervical soft tissues  Cranial nerve II-XII: Pupils were equal round reactive to light. Extraocular movements were full.  Visual field were full on confrontational test. Bilateral fundi were sharp.  Facial sensation and strength were normal. Hearing was intact to finger rubbing bilaterally. Uvula tongue midline.  Head turning  and shoulder shrug and were normal and symmetric.Tongue protrusion into cheek strength was normal.  Motor: Normal tone, bulk and strength.  Sensory: Intact to fine touch, pinprick, preserved vibratory sensation, and proprioception at toes.  Coordination: Normal finger to nose, heel-to-shin bilaterally there was no truncal ataxia  Gait: Rising up from seated position without assistance, normal stance, without trunk ataxia, moderate stride, good arm swing, smooth turning,   Romberg signs: Negative  Deep tendon reflexes: Brachioradialis 2/2, biceps 2/2, triceps 2/2, patellar 2/2, Achilles 2/2, plantar responses were flexor bilaterally.   DIAGNOSTIC DATA (LABS, IMAGING, TESTING) - I reviewed patient records, labs, notes, testing and imaging myself where available.  Lab Results  Component Value Date   WBC 5.6 03/05/2014   HGB 13.8 03/05/2014   HCT 40.3 03/05/2014   MCV 84.5 03/05/2014   PLT 226 03/05/2014      Component Value Date/Time   NA 136* 03/03/2014 0330   K 4.2 03/03/2014 0330   CL 102 03/03/2014 0330   CO2 22 03/03/2014 0330   GLUCOSE 146* 03/03/2014 0330   BUN 11 03/03/2014 0330   CREATININE 0.80 03/03/2014 0330   CALCIUM 8.4 03/03/2014 0330   PROT 7.0 05/15/2014 0905   ALBUMIN 4.1 05/15/2014 0905   AST 21 05/15/2014 0905   ALT 20 05/15/2014 0905   ALKPHOS 77 05/15/2014 0905   BILITOT 1.1 05/15/2014 0905   GFRNONAA 67* 03/03/2014 0330   GFRAA 77* 03/03/2014 0330   Lab Results  Component Value Date   CHOL 212* 05/15/2014   HDL 32.80* 05/15/2014   LDLCALC 143* 05/15/2014   LDLDIRECT 98.6 02/23/2010   TRIG 182.0* 05/15/2014   CHOLHDL 6 05/15/2014   Lab Results  Component Value Date   HGBA1C 7.6* 03/02/2014   Lab Results  Component Value Date   VITAMINB12 437 11/12/2011    ASSESSMENT AND PLAN  DONESHA DUPREY is a 81 y.o. female with long standing history of voice, and motor tics, in recent few years, she also developed neck jerking movement, most  consistent with motor tics, there was also some left facial motor tics, there was also a component of cervical dystonia, she has mild left tilt, left shoulder elevation  She has tried and failed multiple different medications in the past, she  also has past medical history of hypertension, diabetes, hyperlipidemia, coronary artery disease, status post CABG, pacemaker placement, not a good candidate for polypharmacy treatment  She tried EMG guided BOTOX injection once for her motor tic, without improving her symptoms, does not want to continue with injections.   Marcial Pacas, M.D. Ph.D.  [facility] [address] [place], [place] [address] [phone]

## 2015-03-13 NOTE — Telephone Encounter (Signed)
I spoke with the pt's husband and he picked up her Ranexa yesterday from the pharmacy.

## 2015-03-24 DIAGNOSIS — L821 Other seborrheic keratosis: Secondary | ICD-10-CM | POA: Diagnosis not present

## 2015-03-24 DIAGNOSIS — L218 Other seborrheic dermatitis: Secondary | ICD-10-CM | POA: Diagnosis not present

## 2015-03-24 DIAGNOSIS — Z85828 Personal history of other malignant neoplasm of skin: Secondary | ICD-10-CM | POA: Diagnosis not present

## 2015-03-28 ENCOUNTER — Other Ambulatory Visit: Payer: Self-pay | Admitting: Neurological Surgery

## 2015-03-28 DIAGNOSIS — M4317 Spondylolisthesis, lumbosacral region: Secondary | ICD-10-CM

## 2015-04-10 ENCOUNTER — Other Ambulatory Visit: Payer: Self-pay | Admitting: Neurological Surgery

## 2015-04-10 ENCOUNTER — Ambulatory Visit
Admission: RE | Admit: 2015-04-10 | Discharge: 2015-04-10 | Disposition: A | Discharge status: Home / Self Care | Payer: Medicare Other | Source: Ambulatory Visit | Attending: Neurological Surgery | Admitting: Neurological Surgery

## 2015-04-10 DIAGNOSIS — M545 Low back pain: Secondary | ICD-10-CM | POA: Diagnosis not present

## 2015-04-10 DIAGNOSIS — M4317 Spondylolisthesis, lumbosacral region: Secondary | ICD-10-CM

## 2015-04-10 DIAGNOSIS — M4316 Spondylolisthesis, lumbar region: Secondary | ICD-10-CM | POA: Diagnosis not present

## 2015-04-10 MED ORDER — IOHEXOL 180 MG/ML  SOLN
1.0000 mL | Freq: Once | INTRAMUSCULAR | Status: AC | PRN
  Administered 2015-04-10: 1 mL via EPIDURAL

## 2015-04-10 MED ORDER — METHYLPREDNISOLONE ACETATE 40 MG/ML INJ SUSP (RADIOLOG
120.0000 mg | Freq: Once | INTRAMUSCULAR | Status: AC
Start: 2015-04-10 — End: 2015-04-10
  Administered 2015-04-10: 120 mg via EPIDURAL

## 2015-04-21 ENCOUNTER — Telehealth: Payer: Self-pay | Admitting: Neurology

## 2015-04-21 NOTE — Telephone Encounter (Signed)
Returned pts call, pt reports her head is jerking badly and the botox that another doctor did is not working and she needs a sooner appt that august. appt made for 6/1.

## 2015-04-21 NOTE — Telephone Encounter (Signed)
Patient called and requested to speak with [facility]. Would not tell me what issues she was having but stated it was not referring to her sleep problems. Wants to possibly make an earlier appt than her scheduled 8/24 appt. Please call and advise.

## 2015-04-21 NOTE — Telephone Encounter (Signed)
Returned pt's phone call, line was busy. Will try again later.

## 2015-04-23 ENCOUNTER — Other Ambulatory Visit: Payer: Medicare Other

## 2015-05-07 ENCOUNTER — Encounter: Payer: Self-pay | Admitting: Neurology

## 2015-05-07 ENCOUNTER — Ambulatory Visit (INDEPENDENT_AMBULATORY_CARE_PROVIDER_SITE_OTHER): Payer: Medicare Other | Admitting: Neurology

## 2015-05-07 VITALS — BP 117/63 | HR 83 | Resp 16 | Ht 61.0 in | Wt 132.0 lb

## 2015-05-07 DIAGNOSIS — F959 Tic disorder, unspecified: Secondary | ICD-10-CM

## 2015-05-07 DIAGNOSIS — I208 Other forms of angina pectoris: Secondary | ICD-10-CM | POA: Diagnosis not present

## 2015-05-07 DIAGNOSIS — G5139 Clonic hemifacial spasm, unspecified: Secondary | ICD-10-CM | POA: Insufficient documentation

## 2015-05-07 DIAGNOSIS — G513 Clonic hemifacial spasm: Secondary | ICD-10-CM | POA: Diagnosis not present

## 2015-05-07 MED ORDER — DIAZEPAM 2 MG PO TABS: 2.0000 mg | ORAL_TABLET | Freq: Four times a day (QID) | ORAL | Status: DC | PRN

## 2015-05-07 NOTE — Progress Notes (Signed)
PATIENT: Janet Steele DOB: Aug 02, 1929  REASON FOR VISIT: follow up- insomnia, chronic motor tic. HISTORY FROM: patient  HISTORY OF PRESENT ILLNESS:  05-07-15 Janet Steele is a 80 year old female with a history of chronic insomnia and chronic motor tic. She returns today for follow-up. The patient is currently taking trazodone 50 mg at bedtime. She was advised that she could take an extra 25 mg as needed. Patient also has Belsomra 50 mg but states that did not work. She reports that her insomnia has been adequately treated with trazodone. She goes to bed around 11 PM and arises at 9 AM.   The patient does have a motor tic located in the neck and head, face - Patient does have movements of the tongue and a constantly licking of her lips. She started doing this about 12 months ago. She states that its due to dry mouth and chapped lips- she states that she can control it? Otherwise no new symptoms. Her facial movements her neck movements her "" tics have increased in amplitude and frequency and intensity. She feels quite bothered by it also is not painful it keeps her from going to sleep. This is her main complaint today. She also reports that all her life she has been on some kind of tranquilizer and she would like to rather be back on the medication with all consideration of side effects and risks. She is well able to give informed consent to use a tranquilizer. At this time I think she is really more impaired by not using a benzodiazepine rather than by the side effects that chronic benzodiazepine use may have on her cognitive and physical function.  HISTORY 09/12/14: Janet Steele is a 81 y.o. caucasian, right handed, married female , who was seen here as a referral from Dr. Linna Darner for a sleep evaluation, Now would like to address her movement disorder. She has rhythmic neck movements that wake her from sleep. Her cat "has left the usual space on the pillow and is now at her hip, for the patient a  sign that these movements have begun to bother her. The patient moves her head abrupt in a twitching movement to the left and right. No tilt , no flexion. Has some leg twitching, too.  She increased her trazodone to 50 mg at night after Belsomra did not work as well.  She wants to increase it further , but I think this may be the medication that makes her twitch. She is on Cymbalta, too.  This medication mix and her history of CAD make a narrow choice of tic treatment medication . She cannot use ORAP, CLONIDINE.     REVIEW OF SYSTEMS: Out of a complete 14 system review of symptoms, the patient complains only of the following symptoms, and all other reviewed systems are negative.  Eye itching, bruise/bleed easily, anemia  ALLERGIES: Allergies  Allergen Reactions  . Codeine Rash  . Morphine Hives, Itching and Rash  . Penicillins Itching and Rash  . Prednisone Other (See Comments)    MEMORY LOSS   . Sulfonamide Derivatives Itching and Rash  . Hydrocodone-Acetaminophen Other (See Comments)    unknown  . Meperidine Hcl Itching  . Metoclopramide Hcl Itching and Rash  . Metoprolol Succinate Other (See Comments)    nervous  . Moxifloxacin Itching  . Oxycodone-Aspirin Rash  . Pentazocine Lactate Nausea Only  . Pioglitazone Other (See Comments)    bloating  . Tramadol Hcl Other (See Comments)  Unknown, patient says she tolerates medication with no problems. 02/07/2015 TW CMA     HOME MEDICATIONS: Outpatient Prescriptions Prior to Visit  Medication Sig Dispense Refill  . aspirin 325 MG EC tablet Take 325 mg by mouth daily.      Marland Kitchen atorvastatin (LIPITOR) 80 MG tablet Take 1 tablet (80 mg total) by mouth daily at 6 PM. 30 tablet 5  . DULoxetine (CYMBALTA) 60 MG capsule take 1 capsule by mouth once daily 30 capsule 2  . glimepiride (AMARYL) 2 MG tablet Take 2 mg by mouth daily before breakfast.     . isosorbide mononitrate (IMDUR) 120 MG 24 hr tablet take 1 tablet by mouth once daily  30 tablet 5  . levothyroxine (SYNTHROID, LEVOTHROID) 75 MCG tablet Take 1 tablet by mouth daily.  0  . losartan (COZAAR) 50 MG tablet Take 1 tablet (50 mg total) by mouth daily. 30 tablet 5  . metFORMIN (GLUCOPHAGE) 500 MG tablet Take 1 tablet (500 mg total) by mouth daily with breakfast. 90 tablet 3  . nitroGLYCERIN (NITROSTAT) 0.4 MG SL tablet Place 1 tablet (0.4 mg total) under the tongue every 5 (five) minutes as needed. For chest pain 25 tablet 5  . pantoprazole (PROTONIX) 40 MG tablet take 1 tablet by mouth once daily 30 tablet 3  . ranolazine (RANEXA) 500 MG 12 hr tablet Take 1 tablet (500 mg total) by mouth 2 (two) times daily. 60 tablet 11  . traMADol (ULTRAM) 50 MG tablet Take 50 mg by mouth every 6 (six) hours as needed for moderate pain (back pain).   0  . traZODone (DESYREL) 50 MG tablet Take 75 mg by mouth at bedtime as needed for sleep.    . botulinum toxin Type A (BOTOX) 100 UNITS SOLR injection Inject 100 Units into the muscle once.     No facility-administered medications prior to visit.    PAST MEDICAL HISTORY: Past Medical History  Diagnosis Date  . Hypothyroidism   . Hyperlipidemia   . GERD (gastroesophageal reflux disease)   . Anxiety   . Diabetes mellitus   . Hypertension   . PVD (peripheral vascular disease)   . Carotid bruit   . Other dysphagia   . Other esophagitis   . Hemorrhoid   . Other constipation   . Colon, diverticulosis   . Hiatal hernia   . Osteoarthrosis, unspecified whether generalized or localized, unspecified site   . Degenerative joint disease   . Esophageal stricture   . Unspecified hearing loss   . Diastolic dysfunction   . CAD (coronary artery disease)   . Shingles   . Nonspecific abnormal electrocardiogram (ECG) (EKG)   . Nonspecific abnormal electrocardiogram (ECG) (EKG)     PAST SURGICAL HISTORY: Past Surgical History  Procedure Laterality Date  . Appendectomy    . Cholecystectomy    . Abdominal hysterectomy    .  Oophorectomy      bilateral  . Coronary artery bypass graft      x4(1982),02-2002 CABG X2  . Back surgery  01-2000  . Pacemaker insertion  07/06/12    MDT Adapta L implanted by Dr Rayann Heman for symptomatic bradycardia  . Permanent pacemaker insertion N/A 07/06/2012    Procedure: PERMANENT PACEMAKER INSERTION;  Surgeon: Thompson Grayer, MD;  Location[facility] C[facility] LAB;  Service: Cardiovascular;  Laterality: N/A;  . Left heart catheterization with coronary/graft angiogram N/A 03/05/2014    Procedure: LEFT HEART CATHETERIZATION WITH Beatrix Fetters;  Surgeon: Sinclair Grooms, MD;  Location: [facility] CATH LAB;  Service: Cardiovascular;  Laterality: N/A;    FAMILY HISTORY: Family History  Problem Relation Age of Onset  . Heart attack Father   . Heart disease Sister   . Colon cancer Neg Hx     SOCIAL HISTORY: History   Social History  . Marital Status: Married    Spouse Name: [name]  . Number of Children: 3  . Years of Education: 16   Occupational History  . Retired    .     Social History Main Topics  . Smoking status: Former Smoker    Types: Cigarettes    Quit date: 07/05/1976  . Smokeless tobacco: Never Used     Comment: smoked age18- 1977, up to 1/2 ppd  . Alcohol Use: 2.4 oz/week    4 Glasses of wine per week  . Drug Use: No  . Sexual Activity: No   Other Topics Concern  . Not on file   Social History Narrative   Patient is married [name]) and lives at home with her husband.   Patient has three adult children.   Patient is retired.   Patient has a college education.   Patient is right-handed.   Patient does not drink any caffeine.      PHYSICAL EXAM  Filed Vitals:   05/07/15 1248  BP: 117/63  Pulse: 83  Resp: 16  Height: 5\' 1"  (1.549 m)  Weight: 132 lb (59.875 kg)   Body mass index is 24.95 kg/(m^2).  Generalized:  Janet Steele is well groomed and well dressed she's not in apparent pain or distress but she is significantly bothered by the nonstop movement of  her neck and face. It also hinders her to go to sleep. Botox has not worked for her and we will no longer offered. She endorsed today the Epworth sleepiness score at 4-6 points and the fatigue severity score at 19 points both are not elevated her main problem is to stop the movements so she can settle to sleep.  Neurological examination  Mentation: Alert oriented to time, place, history taking. Follows all commands speech and language fluent Cranial nerve  Pupils were equal round reactive to light. Extraocular movements were full, visual field were full on confrontational test. Facial sensation and strength were normal. Uvula tongue midline. Head turning and shoulder shrug  were normal and symmetric. Constant licking of lips. Constant neck turning.  Motor: The motor testing reveals 5 / 5 strength of all 4 extremities. Good symmetric motor tone is noted throughout.  Sensory: Sensory testing is intact to soft touch on all 4 extremities. No evidence of extinction is noted.  Coordination: Cerebellar testing reveals good finger-nose-finger and heel-to-shin bilaterally.  Gait and station: Gait is normal. Tandem gait is slightly unsteady. Romberg is negative. No drift is seen.  Reflexes: Deep tendon reflexes are symmetric and normal bilaterally.  Marland Kitchen   DIAGNOSTIC DATA (LABS, IMAGING, TESTING) - I reviewed patient records, labs, notes, testing and imaging myself where available.   ASSESSMENT AND PLAN  1. Chronic insomnia on trazodone  2. Motor tic , progressing. 3. "Cervical dystonia" per Dr. Krista Blue. Not responding to Botox.   The patient is doing well on trazodone. She states her insomnia has been controlled.  She did not feel that the Belsomra was beneficial. She has stopped taking this. She will restart on valium at a low dose. .   The patient will let us know if her symptoms worsen or she develops new symptoms. She follows  up with the sleep clinic q 6 month.  She will follow-up in 6 months or sooner  if needed.   05/07/2015, 1:10 PM [facility] [address] [place], [place] [address] [phone]

## 2015-05-07 NOTE — Patient Instructions (Signed)
Diazepam tablets What is this medicine? DIAZEPAM (dye AZ e pam) is a benzodiazepine. It is used to treat anxiety and nervousness. It also can help treat alcohol withdrawal, relax muscles, and treat certain types of seizures. This medicine may be used for other purposes; ask your health care provider or pharmacist if you have questions. COMMON BRAND NAME(S): Valium What should I tell my health care provider before I take this medicine? They need to know if you have any of these conditions -an alcohol or drug abuse problem -bipolar disorder, depression, psychosis or other mental health condition -glaucoma -kidney or liver disease -lung or breathing disease -myasthenia gravis -Parkinson's disease -seizures or a history of seizures -suicidal thoughts -an unusual or allergic reaction to diazepam, other benzodiazepines, foods, dyes, or preservatives -pregnant or trying to get pregnant -breast-feeding How should I use this medicine? Take this medicine by mouth with a glass of water. Follow the directions on the prescription label. If this medicine upsets your stomach, take it with food or milk. Take your doses at regular intervals. Do not take your medicine more often than directed. If you have been taking this medicine regularly for some time, do not suddenly stop taking it. You must gradually reduce the dose or you may get severe side effects. Ask your doctor or health care professional for advice. Even after you stop taking this medicine it can still affect your body for several days. Talk to your pediatrician regarding the use of this medicine in children. Special care may be needed. Overdosage: If you think you have taken too much of this medicine contact a poison control center or emergency room at once. NOTE: This medicine is only for you. Do not share this medicine with others. What if I miss a dose? If you miss a dose, take it as soon as you can. If it is almost time for your next dose,  take only that dose. Do not take double or extra doses. What may interact with this medicine? -cimetidine -grapefruit juice -herbal or dietary supplements like kava kava, melatonin, St. John's Wort, or valerian -medicines for anxiety or sleeping problems, like alprazolam, lorazepam, or triazolam -medicines for depression, mental problems or psychiatric disturbances -medicines for HIV infection or AIDS -prescription pain medicines -rifampin, rifapentine, or rifabutin -some medicines for seizures like carbamazepine, phenobarbital, phenytoin, or primidone This list may not describe all possible interactions. Give your health care provider a list of all the medicines, herbs, non-prescription drugs, or dietary supplements you use. Also tell them if you smoke, drink alcohol, or use illegal drugs. Some items may interact with your medicine. What should I watch for while using this medicine? Visit your doctor or health care professional for regular checks on your progress. Your body can become dependent on this medicine. Ask your doctor or health care professional if you still need to take it. You may get drowsy or dizzy. Do not drive, use machinery, or do anything that needs mental alertness until you know how this medicine affects you. To reduce the risk of dizzy and fainting spells, do not stand or sit up quickly, especially if you are an older patient. Alcohol may increase dizziness and drowsiness. Avoid alcoholic drinks. Do not treat yourself for coughs, colds or allergies without asking your doctor or health care professional for advice. Some ingredients can increase possible side effects. What side effects may I notice from receiving this medicine? Side effects that you should report to your doctor or health care professional as soon  as possible: -allergic reactions like skin rash, itching or hives, swelling of the face, lips, or tongue -angry, confused, depressed, other mood changes -breathing  problems -feeling faint or lightheaded, falls -muscle cramps -problems with balance, talking, walking -restlessness -tremors -trouble passing urine or change in the amount of urine -unusually weak or tired Side effects that usually do not require medical attention (report to your doctor or health care professional if they continue or are bothersome): -difficulty sleeping, nightmares -dizziness, drowsiness, clumsiness, or unsteadiness, a hangover effect -headache -nausea, vomiting This list may not describe all possible side effects. Call your doctor for medical advice about side effects. You may report side effects to FDA at 1-800-FDA-1088. Where should I keep my medicine? Keep out of the reach of children. This medicine can be abused. Keep your medicine in a safe place to protect it from theft. Do not share this medicine with anyone. Selling or giving away this medicine is dangerous and against the law. Store at room temperature between 15 and 30 degrees C (59 and 86 degrees F). Protect from light. Keep container tightly closed. Throw away any unused medicine after the expiration date. NOTE: This sheet is a summary. It may not cover all possible information. If you have questions about this medicine, talk to your doctor, pharmacist, or health care provider.  2015, [employer]vier/Gold Standard. (2008-03-11 16:57:35)

## 2015-05-20 ENCOUNTER — Ambulatory Visit (INDEPENDENT_AMBULATORY_CARE_PROVIDER_SITE_OTHER): Payer: Medicare Other | Admitting: Cardiovascular Disease

## 2015-05-20 ENCOUNTER — Encounter: Payer: Self-pay | Admitting: Cardiovascular Disease

## 2015-05-20 VITALS — BP 120/70 | HR 79 | Ht 61.0 in | Wt 134.2 lb

## 2015-05-20 DIAGNOSIS — E782 Mixed hyperlipidemia: Secondary | ICD-10-CM

## 2015-05-20 DIAGNOSIS — I10 Essential (primary) hypertension: Secondary | ICD-10-CM

## 2015-05-20 DIAGNOSIS — I208 Other forms of angina pectoris: Secondary | ICD-10-CM | POA: Diagnosis not present

## 2015-05-20 NOTE — Progress Notes (Signed)
Cardiology Office Note   Date:  05/22/2015   ID:  Janet Steele, DOB [birth year], MRN [id number]  PCP:  Unice Cobble, MD  Cardiologist:  Sherren Mocha, MD    No chief complaint on file.    History of Present Illness: Janet Steele is a 81 y.o. female who presents for follow-up evaluation. She has a history of extensive CAD status post redo CABG, initially in 1982 and most recent surgery in 2003. She most recently presented with non-STEMI in April of this year. She underwent cardiac catheterization demonstrating occlusion of her native coronary arteries with continued patency of the LIMA to LAD and saphenous vein graft to PDA. There is diffuse stenosis between the PDA and the 2 LV branches of the distal right coronary artery. There were collaterals supply the left circumflex distribution. There were no targets amenable to revascularization. Since that time, she has been managed with aspirin, Imdur, atorvastatin and Cozaar. She is not on beta blocker therapy due to allergy/intolerance. Ranexa was added to her medical regimen earlier this year. When last seen in March 2016 Ranexa was increased because of continued CCS Class 3 angina, but she could not tolerate the 1000 mg BID dose, now back on 500 mg BID.   She is doing better. Denies limitation from angina. States she paces herself and is doing ok. Denies shortness of breath or edema. Still gets a chest pressure on occasion.  Past Medical History  Diagnosis Date  . Hypothyroidism   . Hyperlipidemia   . GERD (gastroesophageal reflux disease)   . Anxiety   . Diabetes mellitus   . Hypertension   . PVD (peripheral vascular disease)   . Carotid bruit   . Other dysphagia   . Other esophagitis   . Hemorrhoid   . Other constipation   . Colon, diverticulosis   . Hiatal hernia   . Osteoarthrosis, unspecified whether generalized or localized, unspecified site   . Degenerative joint disease   . Esophageal stricture   . Unspecified  hearing loss   . Diastolic dysfunction   . CAD (coronary artery disease)   . Shingles   . Nonspecific abnormal electrocardiogram (ECG) (EKG)   . Nonspecific abnormal electrocardiogram (ECG) (EKG)     Past Surgical History  Procedure Laterality Date  . Appendectomy    . Cholecystectomy    . Abdominal hysterectomy    . Oophorectomy      bilateral  . Coronary artery bypass graft      x4(1982),02-2002 CABG X2  . Back surgery  01-2000  . Pacemaker insertion  07/06/12    MDT Adapta L implanted by Dr Rayann Heman for symptomatic bradycardia  . Permanent pacemaker insertion N/A 07/06/2012    Procedure: PERMANENT PACEMAKER INSERTION;  Surgeon: Thompson Grayer, MD;  Location: [facility] CATH LAB;  Service: Cardiovascular;  Laterality: N/A;  . Left heart catheterization with coronary/graft angiogram N/A 03/05/2014    Procedure: LEFT HEART CATHETERIZATION WITH Beatrix Fetters;  Surgeon: Sinclair Grooms, MD;  Location: [facility] C[facility] LAB;  Service: Cardiovascular;  Laterality: N/A;    Current Outpatient Prescriptions  Medication Sig Dispense Refill  . aspirin 325 MG EC tablet Take 325 mg by mouth daily.      Marland Kitchen atorvastatin (LIPITOR) 80 MG tablet Take 1 tablet (80 mg total) by mouth daily at 6 PM. 30 tablet 5  . diazepam (VALIUM) 2 MG tablet Take 1 tablet (2 mg total) by mouth every 6 (six) hours as needed for anxiety. 30 tablet 0  .  DULoxetine (CYMBALTA) 60 MG capsule take 1 capsule by mouth once daily 30 capsule 2  . glimepiride (AMARYL) 2 MG tablet Take 2 mg by mouth daily before breakfast.     . isosorbide mononitrate (IMDUR) 120 MG 24 hr tablet take 1 tablet by mouth once daily 30 tablet 5  . levothyroxine (SYNTHROID, LEVOTHROID) 75 MCG tablet Take 1 tablet by mouth daily.  0  . losartan (COZAAR) 50 MG tablet Take 1 tablet (50 mg total) by mouth daily. 30 tablet 5  . metFORMIN (GLUCOPHAGE) 500 MG tablet Take 1 tablet (500 mg total) by mouth daily with breakfast. 90 tablet 3  . nitroGLYCERIN (NITROSTAT) 0.4  MG SL tablet Place 1 tablet (0.4 mg total) under the tongue every 5 (five) minutes as needed. For chest pain 25 tablet 5  . pantoprazole (PROTONIX) 40 MG tablet take 1 tablet by mouth once daily 30 tablet 3  . ranolazine (RANEXA) 500 MG 12 hr tablet Take 1 tablet (500 mg total) by mouth 2 (two) times daily. 60 tablet 11  . traMADol (ULTRAM) 50 MG tablet Take 50 mg by mouth every 6 (six) hours as needed for moderate pain (back pain).   0  . traZODone (DESYREL) 50 MG tablet Take 75 mg by mouth at bedtime as needed for sleep.     No current facility-administered medications for this visit.    Allergies:   Codeine; Morphine; Penicillins; Prednisone; Sulfonamide derivatives; Hydrocodone-acetaminophen; Meperidine hcl; Metoclopramide hcl; Metoprolol succinate; Moxifloxacin; Oxycodone-aspirin; Pentazocine lactate; Pioglitazone; and Tramadol hcl   Social History:  The patient  reports that she quit smoking about 38 years ago. Her smoking use included Cigarettes. She has never used smokeless tobacco. She reports that she drinks about 2.4 oz of alcohol per week. She reports that she does not use illicit drugs.   Family History:  The patient's  family history includes Heart attack in her father; Heart disease in her sister. There is no history of Colon cancer.   PHYSICAL EXAM: VS:  BP 120/70 mmHg  Pulse 79  Ht 5\' 1"  (1.549 m)  Wt 134 lb 3.2 oz (60.873 kg)  BMI 25.37 kg/m2 , BMI Body mass index is 25.37 kg/(m^2). GEN: Well nourished, well developed, pleasant elderly woman in no acute distress HEENT: normal Neck: no JVD, no masses.  Cardiac: RRR with a soft SEM at the LSB                Respiratory:  clear to auscultation bilaterally, normal work of breathing GI: soft, nontender, nondistended, + BS MS: no deformity or atrophy Ext: no pretibial edema Skin: warm and dry, no rash Neuro:  Strength and sensation are intact Psych: euthymic mood, full affect  EKG:  EKG is ordered today. The ekg ordered  today shows electronic atrial pacemaker 79 bpm, inferior infarct age undetermined, ST and T wave abnormality consider inferolateral ischemia, unchanged from previous tracing.  Recent Labs: No results found for requested labs within last 365 days.   Lipid Panel     Component Value Date/Time   CHOL 212* 05/15/2014 0905   TRIG 182.0* 05/15/2014 0905   HDL 32.80* 05/15/2014 0905   CHOLHDL 6 05/15/2014 0905   VLDL 36.4 05/15/2014 0905   LDLCALC 143* 05/15/2014 0905   LDLDIRECT 98.6 02/23/2010 1255      Wt Readings from Last 3 Encounters:  05/20/15 134 lb 3.2 oz (60.873 kg)  05/07/15 132 lb (59.875 kg)  03/12/15 131 lb (59.421 kg)    ASSESSMENT AND  PLAN: 1.  CAD, native vessel and graft disease: The patient is stable with respect to her anginal symptoms. She is brought back for 3 month follow-up as she reported increasing angina when I saw her last. She was unable to tolerate Ranexa 1000 mg twice a day, but is tolerating the 500 mg twice a day dose. We'll continue her current regimen as there has been no progression of symptoms. She will return for follow-up in one year unless problems arise.  2. Essential hypertension: Blood pressure is controlled on her current regimen.  3. Hyperlipidemia: Treated with high-dose atorvastatin 80 mg daily.   Current medicines are reviewed with the patient today.  The patient does not have concerns regarding medicines.  Labs/ tests ordered today include:   Orders Placed This Encounter  Procedures  . EKG 12-Lead    Disposition:   FU one year  Signed, Sherren Mocha, MD  05/22/2015 6:02 AM    [facility] [address], [place], [place]  [address] Phone: [phone]; Fax: [phone]

## 2015-05-20 NOTE — Patient Instructions (Signed)

## 2015-05-27 ENCOUNTER — Other Ambulatory Visit (INDEPENDENT_AMBULATORY_CARE_PROVIDER_SITE_OTHER): Payer: Medicare Other

## 2015-05-27 ENCOUNTER — Encounter: Payer: Self-pay | Admitting: Internal Medicine

## 2015-05-27 ENCOUNTER — Ambulatory Visit (INDEPENDENT_AMBULATORY_CARE_PROVIDER_SITE_OTHER): Payer: Medicare Other | Admitting: Internal Medicine

## 2015-05-27 VITALS — BP 108/62 | HR 86 | Temp 98.1°F | Resp 18 | Wt 133.0 lb

## 2015-05-27 DIAGNOSIS — E1165 Type 2 diabetes mellitus with hyperglycemia: Secondary | ICD-10-CM

## 2015-05-27 DIAGNOSIS — R112 Nausea with vomiting, unspecified: Secondary | ICD-10-CM

## 2015-05-27 DIAGNOSIS — I208 Other forms of angina pectoris: Secondary | ICD-10-CM | POA: Diagnosis not present

## 2015-05-27 DIAGNOSIS — N390 Urinary tract infection, site not specified: Secondary | ICD-10-CM | POA: Diagnosis not present

## 2015-05-27 LAB — URINALYSIS, ROUTINE W REFLEX MICROSCOPIC
Bilirubin Urine: NEGATIVE
Hgb urine dipstick: NEGATIVE
KETONES UR: NEGATIVE
Nitrite: NEGATIVE
RBC / HPF: NONE SEEN (ref 0–?)
SPECIFIC GRAVITY, URINE: 1.015 (ref 1.000–1.030)
Total Protein, Urine: NEGATIVE
URINE GLUCOSE: NEGATIVE
UROBILINOGEN UA: 0.2 (ref 0.0–1.0)
pH: 6 (ref 5.0–8.0)

## 2015-05-27 LAB — HEPATIC FUNCTION PANEL
ALK PHOS: 85 U/L (ref 39–117)
ALT: 16 U/L (ref 0–35)
AST: 16 U/L (ref 0–37)
Albumin: 4.2 g/dL (ref 3.5–5.2)
Bilirubin, Direct: 0.1 mg/dL (ref 0.0–0.3)
TOTAL PROTEIN: 7.2 g/dL (ref 6.0–8.3)
Total Bilirubin: 0.8 mg/dL (ref 0.2–1.2)

## 2015-05-27 LAB — CBC WITH DIFFERENTIAL/PLATELET
BASOS ABS: 0 10*3/uL (ref 0.0–0.1)
BASOS PCT: 0.4 % (ref 0.0–3.0)
EOS ABS: 0.1 10*3/uL (ref 0.0–0.7)
Eosinophils Relative: 1.5 % (ref 0.0–5.0)
HEMATOCRIT: 42.4 % (ref 36.0–46.0)
Hemoglobin: 14.4 g/dL (ref 12.0–15.0)
LYMPHS ABS: 1.4 10*3/uL (ref 0.7–4.0)
LYMPHS PCT: 18.6 % (ref 12.0–46.0)
MCHC: 33.9 g/dL (ref 30.0–36.0)
MCV: 87.3 fl (ref 78.0–100.0)
Monocytes Absolute: 0.5 10*3/uL (ref 0.1–1.0)
Monocytes Relative: 6.5 % (ref 3.0–12.0)
NEUTROS ABS: 5.6 10*3/uL (ref 1.4–7.7)
Neutrophils Relative %: 73 % (ref 43.0–77.0)
Platelets: 327 10*3/uL (ref 150.0–400.0)
RBC: 4.86 Mil/uL (ref 3.87–5.11)
RDW: 14.1 % (ref 11.5–15.5)
WBC: 7.7 10*3/uL (ref 4.0–10.5)

## 2015-05-27 LAB — BASIC METABOLIC PANEL
BUN: 14 mg/dL (ref 6–23)
CHLORIDE: 99 meq/L (ref 96–112)
CO2: 28 meq/L (ref 19–32)
CREATININE: 1.14 mg/dL (ref 0.40–1.20)
Calcium: 9.7 mg/dL (ref 8.4–10.5)
GFR: 48.31 mL/min — AB (ref >60.00)
Glucose, Bld: 205 mg/dL — ABNORMAL HIGH (ref 70–99)
POTASSIUM: 3.8 meq/L (ref 3.5–5.1)
Sodium: 135 mEq/L (ref 135–145)

## 2015-05-27 LAB — HEMOGLOBIN A1C: Hgb A1c MFr Bld: 7.1 % — ABNORMAL HIGH (ref 4.6–6.5)

## 2015-05-27 NOTE — Progress Notes (Signed)
Pre visit review using our clinic review tool, if applicable. No additional management support is needed unless otherwise documented below in the visit note. 

## 2015-05-27 NOTE — Progress Notes (Signed)
   Subjective:    Patient ID: Janet Steele, female    DOB: [birth year], 81 y.o.   MRN: [id number]  HPI Her symptoms began 6/19-6/20 as nausea and vomiting. She stated that she vomited repeatedly through the night. There was no specific trigger for this. She has had no further vomiting 6/20 or 6/21; but she is "afraid to eat". 2 weeks ago she vomited once during the day.  She has some lower abdominal discomfort with the vomiting. She also has epigastric pressure after the vomiting.  She felt cold all day yesterday and had associated fatigue. She has no other GI symptoms.  She has continued her statin and diabetic medications which include Amaryl and metformin.  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 She also had a small distal esophageal diverticulum. She underwent balloon dilation. She does describe dysphagia rarely, "3-4 times per year".  She has urinary frequency, "every 45 minutes". She has nocturia 3-4 times per night.  She was to see her diabetologists early this month but forgot the appointment. She does not have a follow-up appointment until November.   Review of Systems  Unexplained weight loss, significant dyspepsia, melena, rectal bleeding, or persistently small caliber stools are denied.  Dysuria, pyuria, hematuria,  or polyuria are denied.     Objective:   Physical Exam Pertinent or positive findings include:  She has bilateral ptosis. Complete dentures present. Abdomen is protuberant. There is a deformity at the PIP joint of the right thumb. Dorsalis pedis pulses are decreased. She has slight tenting.  General appearance :adequately nourished; in no distress.  Eyes: No conjunctival inflammation or scleral icterus is present.  Oral exam:  Lips and gums are healthy appearing.There is no oropharyngeal erythema or exudate noted.  Heart:  Normal rate and regular rhythm. S1 and S2 normal without gallop, murmur,  click, rub or other extra sounds    Lungs:Chest clear to auscultation; no wheezes, rhonchi,rales ,or rubs present.No increased work of breathing.   Abdomen: bowel sounds normal, soft and non-tender without masses, organomegaly or hernias noted.  No guarding or rebound. No flank tenderness to percussion.  Vascular : all pulses equal ; no bruits present.  Skin:Warm & dry.  Intact without suspicious lesions or rashes ; no  jaundice   Lymphatic: No lymphadenopathy is noted about the head, neck, axilla  Neuro: Strength, tone decreased       Assessment & Plan:  #1 nausea and vomiting  #2 diabetes, question status  #3 polypharmacy with potential  adverse risk in context of #1  Plan: See orders & recommendations

## 2015-05-27 NOTE — Patient Instructions (Signed)
Stay on clear liquids for 48-72 hours .This would include  jello, sherbert (NOT ice cream), Lipton's chicken noodle soup(NOT cream based soups),Gatorade Lite, flat Ginger ale (without High Fructose Corn Syrup),dry toast or crackers, baked potato.No milk , dairy or grease until bowels are formed. Align , a [employer] , daily if stools are loose. Immodium AD for frankly watery stool. Report increasing pain, fever or rectal bleeding.  Please do not take the atorvastatin, Amaryl, or metformin until notified to restart these.

## 2015-05-28 ENCOUNTER — Ambulatory Visit (INDEPENDENT_AMBULATORY_CARE_PROVIDER_SITE_OTHER): Payer: Medicare Other | Admitting: *Deleted

## 2015-05-28 ENCOUNTER — Telehealth: Payer: Self-pay | Admitting: Cardiology

## 2015-05-28 DIAGNOSIS — I495 Sick sinus syndrome: Secondary | ICD-10-CM

## 2015-05-28 NOTE — Telephone Encounter (Signed)
Confirmed remote transmission w/ pt husband.   

## 2015-05-29 DIAGNOSIS — I495 Sick sinus syndrome: Secondary | ICD-10-CM | POA: Diagnosis not present

## 2015-05-29 LAB — URINE CULTURE: Colony Count: >=100000

## 2015-05-29 NOTE — Progress Notes (Signed)
Remote pacemaker transmission.   

## 2015-05-30 ENCOUNTER — Other Ambulatory Visit: Payer: Self-pay | Admitting: Internal Medicine

## 2015-05-30 ENCOUNTER — Other Ambulatory Visit: Payer: Self-pay | Admitting: Cardiovascular Disease

## 2015-05-30 MED ORDER — CIPROFLOXACIN HCL 500 MG PO TABS: 500.0000 mg | ORAL_TABLET | Freq: Two times a day (BID) | ORAL | Status: DC

## 2015-06-01 ENCOUNTER — Other Ambulatory Visit: Payer: Self-pay | Admitting: Neurology

## 2015-06-01 LAB — CUP PACEART REMOTE DEVICE CHECK
Battery Impedance: 157 Ohm
Battery Remaining Longevity: 129 mo
Battery Voltage: 2.79 V
Brady Statistic AP VS Percent: 88 %
Brady Statistic AS VS Percent: 12 %
Date Time Interrogation Session: 20160623130723
Lead Channel Impedance Value: 484 Ohm
Lead Channel Impedance Value: 615 Ohm
Lead Channel Pacing Threshold Amplitude: 0.875 V
Lead Channel Pacing Threshold Amplitude: 1 V
Lead Channel Pacing Threshold Pulse Width: 0.4 ms
Lead Channel Pacing Threshold Pulse Width: 0.4 ms
Lead Channel Setting Pacing Amplitude: 2 V
Lead Channel Setting Pacing Amplitude: 2.5 V
MDC IDC MSMT LEADCHNL RV SENSING INTR AMPL: 16 mV
MDC IDC SET LEADCHNL RV PACING PULSEWIDTH: 0.4 ms
MDC IDC SET LEADCHNL RV SENSING SENSITIVITY: 5.6 mV
MDC IDC STAT BRADY AP VP PERCENT: 0 %
MDC IDC STAT BRADY AS VP PERCENT: 0 %

## 2015-06-02 ENCOUNTER — Other Ambulatory Visit: Payer: Self-pay

## 2015-06-04 ENCOUNTER — Encounter: Payer: Self-pay | Admitting: Cardiology

## 2015-06-16 ENCOUNTER — Encounter: Payer: Self-pay | Admitting: Internal Medicine

## 2015-06-18 ENCOUNTER — Encounter: Payer: Self-pay | Admitting: Cardiology

## 2015-06-23 ENCOUNTER — Telehealth: Payer: Self-pay | Admitting: Cardiovascular Disease

## 2015-06-23 MED ORDER — AMLODIPINE BESYLATE 2.5 MG PO TABS: 2.5000 mg | ORAL_TABLET | Freq: Every day | ORAL | Status: DC

## 2015-06-23 NOTE — Telephone Encounter (Signed)
She reported these symptoms to me at recent [place]. Limited options with drug allergies/intolerances and current Rx's. Would try amlodipine 2.5 mg daily and titrate as tolerated. Would try this and arrange PA/NP visit 3-4 weeks to titrate anti-anginal Rx. thx

## 2015-06-23 NOTE — Telephone Encounter (Signed)
Pt c/o of Chest Pain: STAT if CP now or developed within 24 hours  1. Are you having CP right now? No  2. Are you experiencing any other symptoms (ex. SOB, nausea, vomiting, sweating)? NO  3. How long have you been experiencing CP? For the last few months. It hasn't gotten worse over the last few days she is simply just now calling to report this   4. Is your CP continuous or coming and going? When she walks up the steps slowly or if she reaches for something off of the top sheelf when she comes down she feels very heavy   5. Have you taken Nitroglycerin? Yes. But she only takes this when its very bad.  Comments: Pt states that this has been happening for a while she just thought that it was now time to report this. ?

## 2015-06-23 NOTE — Telephone Encounter (Signed)
Pt aware and will start Amlodipine 2.5 mg daily.  She is aware she will be called with an appt to f/u.

## 2015-06-23 NOTE — Telephone Encounter (Signed)
Spoke with pt who reports she has been having a heaviness in her chest for several months now.  She has to be doing something for it to occur such as reaching for something or changing her sheets etc.  She started Ranexa 500 mg BID in June and doesn't know if its helped or not.  When the discomfort occurs she doesn't have SOB, N/V or radiation.  She reports pain level is a 5 to 6 and lasts for 5-10 minuntes.  It resolves with rest and she also takes either her Ranexa, Asa or SL Ntg (1 to 2).  She is aware that if she needs to take a 3rd sl she should call 911 and be evaluated at the hospital.  She states understanding.  Aware I will forward this information to Dr Burt Knack for further reviewed and orders.

## 2015-07-03 NOTE — Telephone Encounter (Signed)
Pt scheduled to see Dr Burt Knack 07/25/15 for follow-up.

## 2015-07-04 ENCOUNTER — Other Ambulatory Visit: Payer: Self-pay | Admitting: Neurology

## 2015-07-07 NOTE — Telephone Encounter (Signed)
Rx has been signed and faxed  

## 2015-07-25 ENCOUNTER — Encounter: Payer: Self-pay | Admitting: Cardiovascular Disease

## 2015-07-25 ENCOUNTER — Ambulatory Visit (INDEPENDENT_AMBULATORY_CARE_PROVIDER_SITE_OTHER): Payer: Medicare Other | Admitting: Cardiovascular Disease

## 2015-07-25 VITALS — BP 112/64 | HR 90 | Ht 60.0 in | Wt 133.8 lb

## 2015-07-25 DIAGNOSIS — I25708 Atherosclerosis of coronary artery bypass graft(s), unspecified, with other forms of angina pectoris: Secondary | ICD-10-CM | POA: Diagnosis not present

## 2015-07-25 DIAGNOSIS — I208 Other forms of angina pectoris: Secondary | ICD-10-CM | POA: Diagnosis not present

## 2015-07-25 NOTE — Progress Notes (Signed)
Cardiology Office Note Date:  07/25/2015   ID:  ORVILLE BELL, DOB 06-26-1929, MRN [id number]  PCP:  Unice Cobble, MD  Cardiologist:  Sherren Mocha, MD    No chief complaint on file.   History of Present Illness: Janet Steele is a 81 y.o. female who presents for follow-up evaluation. She has a history of extensive CAD status post redo CABG, initially in 1982 and most recent surgery in 2003. She most recently presented with non-STEMI in April of this year. She underwent cardiac catheterization demonstrating occlusion of her native coronary arteries with continued patency of the LIMA to LAD and saphenous vein graft to PDA. There is diffuse stenosis between the PDA and the 2 LV branches of the distal right coronary artery. There were collaterals supply the left circumflex distribution. There were no targets amenable to revascularization. Since that time, she has been managed with aspirin, Imdur, atorvastatin and Cozaar. She is not on beta blocker therapy due to allergy/intolerance. Ranexa was added to her medical regimen earlier this year. When I saw her last she was doing well. However, she called in one month ago with increasing angina. We added amlodipine 2.5 mg to her medical regimen. She returns today for follow-up.  She really is not sure why she is here today. She admits that her memory is getting worse. She does state that her angina has completely resolved. She hasn't had any problems in the last several weeks. She denies taking any nitroglycerin. She feels well and has no complaints today.   Past Medical History  Diagnosis Date  . Hypothyroidism   . Hyperlipidemia   . GERD (gastroesophageal reflux disease)   . Anxiety   . Diabetes mellitus   . Hypertension   . PVD (peripheral vascular disease)   . Carotid bruit   . Other dysphagia   . Other esophagitis   . Hemorrhoid   . Other constipation   . Colon, diverticulosis   . Hiatal hernia   . Osteoarthrosis, unspecified  whether generalized or localized, unspecified site   . Degenerative joint disease   . Esophageal stricture   . Unspecified hearing loss   . Diastolic dysfunction   . CAD (coronary artery disease)   . Shingles   . Nonspecific abnormal electrocardiogram (ECG) (EKG)   . Nonspecific abnormal electrocardiogram (ECG) (EKG)     Past Surgical History  Procedure Laterality Date  . Appendectomy    . Cholecystectomy    . Abdominal hysterectomy    . Oophorectomy      bilateral  . Coronary artery bypass graft      x4(1982),02-2002 CABG X2  . Back surgery  01-2000  . Pacemaker insertion  07/06/12    MDT Adapta L implanted by Dr Rayann Heman for symptomatic bradycardia  . Permanent pacemaker insertion N/A 07/06/2012    Procedure: PERMANENT PACEMAKER INSERTION;  Surgeon: Thompson Grayer, MD;  Location: [facility] CATH LAB;  Service: Cardiovascular;  Laterality: N/A;  . Left heart catheterization with coronary/graft angiogram N/A 03/05/2014    Procedure: LEFT HEART CATHETERIZATION WITH Beatrix Fetters;  Surgeon: Sinclair Grooms, MD;  Location: [facility] CATH LAB;  Service: Cardiovascular;  Laterality: N/A;    Current Outpatient Prescriptions  Medication Sig Dispense Refill  . amLODipine (NORVASC) 2.5 MG tablet Take 1 tablet (2.5 mg total) by mouth daily. 30 tablet 6  . aspirin 325 MG EC tablet Take 325 mg by mouth daily.      Marland Kitchen atorvastatin (LIPITOR) 80 MG tablet Take 1 tablet (  80 mg total) by mouth daily at 6 PM. 30 tablet 5  . ciprofloxacin (CIPRO) 500 MG tablet Take 1 tablet (500 mg total) by mouth 2 (two) times daily. 20 tablet 0  . diazepam (VALIUM) 2 MG tablet take 1 tablet by mouth every 6 hours if needed for anxiety 30 tablet 3  . DULoxetine (CYMBALTA) 60 MG capsule take 1 capsule by mouth once daily 30 capsule 2  . glimepiride (AMARYL) 2 MG tablet Take 2 mg by mouth daily before breakfast.     . isosorbide mononitrate (IMDUR) 120 MG 24 hr tablet take 1 tablet by mouth once daily 30 tablet 5  .  levothyroxine (SYNTHROID, LEVOTHROID) 75 MCG tablet Take 1 tablet by mouth daily.  0  . losartan (COZAAR) 50 MG tablet Take 1 tablet (50 mg total) by mouth daily. 30 tablet 5  . metFORMIN (GLUCOPHAGE) 500 MG tablet Take 1 tablet (500 mg total) by mouth daily with breakfast. 90 tablet 3  . nitroGLYCERIN (NITROSTAT) 0.4 MG SL tablet Place 1 tablet (0.4 mg total) under the tongue every 5 (five) minutes as needed. For chest pain 25 tablet 5  . pantoprazole (PROTONIX) 40 MG tablet take 1 tablet by mouth once daily 30 tablet 6  . ranolazine (RANEXA) 500 MG 12 hr tablet Take 1 tablet (500 mg total) by mouth 2 (two) times daily. 60 tablet 11  . traMADol (ULTRAM) 50 MG tablet Take 50 mg by mouth every 6 (six) hours as needed for moderate pain (back pain).   0  . traZODone (DESYREL) 50 MG tablet take 1 and 1/2 tablet by mouth at bedtime if needed 145 tablet 1   No current facility-administered medications for this visit.    Allergies:   Codeine; Morphine; Penicillins; Prednisone; Sulfonamide derivatives; Hydrocodone-acetaminophen; Meperidine hcl; Metoclopramide hcl; Metoprolol succinate; Moxifloxacin; Oxycodone-aspirin; Pentazocine lactate; Pioglitazone; and Tramadol hcl   Social History:  The patient  reports that she quit smoking about 39 years ago. Her smoking use included Cigarettes. She has never used smokeless tobacco. She reports that she drinks about 2.4 oz of alcohol per week. She reports that she does not use illicit drugs.   Family History:  The patient's  family history includes Heart attack in her father; Heart disease in her sister. There is no history of Colon cancer.    ROS:  Please see the history of present illness.  Otherwise, review of systems is positive for hearing loss, cough, easy bruising.  All other systems are reviewed and negative.    PHYSICAL EXAM: VS:  BP 112/64 mmHg  Pulse 90  Ht 5' (1.524 m)  Wt 133 lb 12.8 oz (60.691 kg)  BMI 26.13 kg/m2  SpO2 91% , BMI Body mass  index is 26.13 kg/(m^2). GEN: Well nourished, well developed, in no acute distress HEENT: normal Neck: no JVD, no masses. No carotid bruits Cardiac: RRR with 2/6 SEM at the RUSB                Respiratory:  clear to auscultation bilaterally, normal work of breathing GI: soft, nontender, nondistended, + BS MS: no deformity or atrophy Ext: no pretibial edema Skin: warm and dry, no rash Neuro:  Strength and sensation are intact Psych: euthymic mood, full affect  EKG:  EKG is not ordered today.  Recent Labs: 05/27/2015: ALT 16; BUN 14; Creatinine, Ser 1.14; Hemoglobin 14.4; Platelets 327.0; Potassium 3.8; Sodium 135   Lipid Panel     Component Value Date/Time   CHOL 212*  05/15/2014 0905   TRIG 182.0* 05/15/2014 0905   HDL 32.80* 05/15/2014 0905   CHOLHDL 6 05/15/2014 0905   VLDL 36.4 05/15/2014 0905   LDLCALC 143* 05/15/2014 0905   LDLDIRECT 98.6 02/23/2010 1255      Wt Readings from Last 3 Encounters:  07/25/15 133 lb 12.8 oz (60.691 kg)  05/27/15 133 lb (60.328 kg)  05/20/15 134 lb 3.2 oz (60.873 kg)    ASSESSMENT AND PLAN: 1.  CAD, native vessel and bypass graft disease, without angina. Fortunately her anginal symptoms have resolved with the addition of amlodipine. She also continues on isosorbide and ranexa. She's allergic to beta-blockers. Continue same Rx and see back in 6 months.  2. HTN: controlled on current Rx  3. Hyperlipidemia: stable on high dose atorvastatin.   Current medicines are reviewed with the patient today.  The patient does not have concerns regarding medicines.  Labs/ tests ordered today include:  No orders of the defined types were placed in this encounter.    Disposition:   FU 6 months  Signed, Sherren Mocha, MD  07/25/2015 4:26 PM    [facility] [place], [place], [place]  [address] Phone: [phone]; Fax: [phone]

## 2015-07-25 NOTE — Patient Instructions (Signed)
Medication Instructions:  Your physician recommends that you continue on your current medications as directed. Please refer to the Current Medication list given to you today.  Labwork: No new orders.   Testing/Procedures: No new orders.   Follow-Up: Your physician wants you to follow-up in: 6 MONTHS with Dr Burt Knack.  You will receive a reminder letter in the mail two months in advance. If you don't receive a letter, please call our office to schedule the follow-up appointment.   Any Other Special Instructions Will Be Listed Below (If Applicable).

## 2015-07-30 ENCOUNTER — Ambulatory Visit: Payer: Medicare Other | Admitting: Adult Health

## 2015-08-06 DIAGNOSIS — Z23 Encounter for immunization: Secondary | ICD-10-CM | POA: Diagnosis not present

## 2015-08-13 DIAGNOSIS — Z23 Encounter for immunization: Secondary | ICD-10-CM | POA: Diagnosis not present

## 2015-08-19 ENCOUNTER — Other Ambulatory Visit: Payer: Self-pay | Admitting: Emergency Medicine

## 2015-08-19 MED ORDER — METFORMIN HCL 500 MG PO TABS: 500.0000 mg | ORAL_TABLET | Freq: Every day | ORAL | Status: DC

## 2015-08-27 ENCOUNTER — Ambulatory Visit (INDEPENDENT_AMBULATORY_CARE_PROVIDER_SITE_OTHER): Payer: Medicare Other | Admitting: *Deleted

## 2015-08-27 ENCOUNTER — Telehealth: Payer: Self-pay | Admitting: Cardiology

## 2015-08-27 DIAGNOSIS — I495 Sick sinus syndrome: Secondary | ICD-10-CM | POA: Diagnosis not present

## 2015-08-27 NOTE — Telephone Encounter (Signed)
Confirmed remote transmission w/ pt husband.   

## 2015-08-27 NOTE — Progress Notes (Signed)
Remote pacemaker transmission.   

## 2015-08-28 ENCOUNTER — Ambulatory Visit (INDEPENDENT_AMBULATORY_CARE_PROVIDER_SITE_OTHER): Payer: Medicare Other | Admitting: Internal Medicine

## 2015-08-28 ENCOUNTER — Encounter: Payer: Self-pay | Admitting: Internal Medicine

## 2015-08-28 ENCOUNTER — Other Ambulatory Visit: Payer: Medicare Other

## 2015-08-28 VITALS — BP 122/76 | HR 70 | Temp 97.7°F | Resp 16 | Wt 131.0 lb

## 2015-08-28 DIAGNOSIS — I208 Other forms of angina pectoris: Secondary | ICD-10-CM

## 2015-08-28 DIAGNOSIS — R413 Other amnesia: Secondary | ICD-10-CM | POA: Diagnosis not present

## 2015-08-28 DIAGNOSIS — R350 Frequency of micturition: Secondary | ICD-10-CM

## 2015-08-28 NOTE — Progress Notes (Signed)
   Subjective:    Patient ID: Janet Steele, female    DOB: November 10, 1929, 81 y.o.   MRN: [id number]  HPI  She called this week requesting a refill of her Cipro. She was told that she would need to be seen as this is not a maintenance medicine. She wanted the medicine because of urinary frequency which she states she's had for months. She describes urgency when she sits up from lying on the couch or when she stands. She has nocturia once nightly.  She did have Pseudomonas aeruginosa urinary tract infection 05/27/15 which was uniformly sensitive.  She is unable to tell me what her glucose ranges are. She states that her glucoses are "all right". She checked it yesterday but cannot remember that number. She asked me to "shoot me a range of glucoses". Her last A1c was 7.1% on 6/21.  She has a history of phonic and motor tics; there is a question of possible Tourette's syndrome variant  Review of Systems  She denies fever, chills, or sweats. She has no associated flank pain. She also has no dysuria, hematuria or pyuria.  It is unclear whether she's having polyuria. There is no polydipsia or polyphagia.      Objective:   Physical Exam Pertinent or positive findings include: She has complete dentures. She has intermittent involuntary lip  licking motion of the tongue. Tenting is present. Pedal pulses are decreased. Feet are cool.  she was unable to collect a urine as she urinated outside the collection device placed on the toilet. She admits to severe memory deficit as documented above.  General appearance :adequately nourished; in no distress.  Eyes: No conjunctival inflammation or scleral icterus is present.  Oral exam:  Lips and gums are healthy appearing.There is no oropharyngeal erythema or exudate noted.   Heart:  Normal rate and regular rhythm. S1 and S2 normal without gallop, murmur, click, rub or other extra sounds    Lungs:Chest clear to auscultation; no wheezes, rhonchi,rales ,or  rubs present.No increased work of breathing.   Abdomen: bowel sounds normal, soft and non-tender without masses, organomegaly or hernias noted.  No guarding or rebound. No flank tenderness to percussion.  Vascular : all pulses equal ; no bruits present.  Skin:Warm & dry.  Intact without suspicious lesions or rashes ; no  jaundice   Lymphatic: No lymphadenopathy is noted about the head, neck, axilla.   Neuro: Strength, tone decreased     Assessment & Plan:  #1 urinary frequency  #2 urinary urgency  #3 nocturia  #4 memory deficit  Plan: Antibiotics  will be held pending the final culture results.  Neurology referral will be made.

## 2015-08-28 NOTE — Patient Instructions (Signed)
Criteria for a significant Urinary Tract Infection (UTI) are: over 100,000 colonies of a single, not multiple  bacteria.  You should have results of all urine cultures  to verify whether you are truly having UTIs.  Symptoms can also be caused by a non infectious process such as interstitial cystitis for example. A Urology referral is recommended for  recurrent symptoms if this culture is negative as antibiotics taken for non infectious processes  would have adverse effects & no benefit .

## 2015-08-28 NOTE — Progress Notes (Signed)
Pre visit review using our clinic review tool, if applicable. No additional management support is needed unless otherwise documented below in the visit note. 

## 2015-08-29 ENCOUNTER — Telehealth: Payer: Self-pay | Admitting: Emergency Medicine

## 2015-08-29 ENCOUNTER — Other Ambulatory Visit: Payer: Medicare Other

## 2015-08-29 DIAGNOSIS — R350 Frequency of micturition: Secondary | ICD-10-CM

## 2015-08-29 NOTE — Telephone Encounter (Signed)
Pt came into lab to give urine culture today dur to not being able to give specimen at appt 08/28/15

## 2015-08-30 LAB — CUP PACEART REMOTE DEVICE CHECK
Battery Impedance: 157 Ohm
Brady Statistic AP VP Percent: 0 %
Brady Statistic AP VS Percent: 89 %
Brady Statistic AS VS Percent: 10 %
Date Time Interrogation Session: 20160921172343
Lead Channel Impedance Value: 584 Ohm
Lead Channel Pacing Threshold Amplitude: 0.875 V
Lead Channel Pacing Threshold Amplitude: 0.875 V
Lead Channel Sensing Intrinsic Amplitude: 16 mV
Lead Channel Setting Pacing Amplitude: 2 V
Lead Channel Setting Pacing Amplitude: 2.5 V
Lead Channel Setting Pacing Pulse Width: 0.4 ms
Lead Channel Setting Sensing Sensitivity: 5.6 mV
MDC IDC MSMT BATTERY REMAINING LONGEVITY: 128 mo
MDC IDC MSMT BATTERY VOLTAGE: 2.79 V
MDC IDC MSMT LEADCHNL RA IMPEDANCE VALUE: 464 Ohm
MDC IDC MSMT LEADCHNL RA PACING THRESHOLD PULSEWIDTH: 0.4 ms
MDC IDC MSMT LEADCHNL RV PACING THRESHOLD PULSEWIDTH: 0.4 ms
MDC IDC STAT BRADY AS VP PERCENT: 0 %

## 2015-08-31 LAB — URINE CULTURE: Colony Count: 15000

## 2015-09-03 ENCOUNTER — Telehealth: Payer: Self-pay | Admitting: Internal Medicine

## 2015-09-03 NOTE — Telephone Encounter (Signed)
Pt called request the result of the lab result that was done on 08/29/15. Please call pt because she does not see anything in the mail

## 2015-09-04 ENCOUNTER — Telehealth: Payer: Self-pay | Admitting: Adult Health

## 2015-09-04 NOTE — Telephone Encounter (Signed)
Pt called requesting to know what the results for urinalysis is. I explained to her that she would get those from Dr [employer] office. She said "I have a letter here from him to see my neurologist for memory problems. I don't have memory problems, I just want to know my test results from urinalysis". She was very confused and we repeated that same scenario 2 more times. She finally saw on the letter the test results and said "well someone from your office will eventually call me". Again I told her we had referral from Dr Linna Darner to schedule appt for memory problems and she said "I don't have memory problems, I am tired of seeing Drs I am not going to scehdule appt."

## 2015-09-04 NOTE — Telephone Encounter (Signed)
Ok thanks 

## 2015-09-04 NOTE — Telephone Encounter (Signed)
Spoke with Pts husband and informed that labs were mailed on 9/26.

## 2015-09-05 ENCOUNTER — Encounter: Payer: Self-pay | Admitting: Cardiology

## 2015-09-12 ENCOUNTER — Telehealth: Payer: Self-pay | Admitting: Internal Medicine

## 2015-09-12 DIAGNOSIS — H1033 Unspecified acute conjunctivitis, bilateral: Secondary | ICD-10-CM | POA: Diagnosis not present

## 2015-09-12 NOTE — Telephone Encounter (Signed)
Left a message for patient to call back. 

## 2015-09-15 NOTE — Telephone Encounter (Signed)
Patient calling back regarding this. She states that she is still vomiting and is having acid reflux with this.

## 2015-09-15 NOTE — Telephone Encounter (Signed)
Pt states she has been vomiting due to her reflux. States every time she eats she vomits. Pt states she is out of her medicine that she takes for reflux. Pt was given protonix in June with 6 refills. Discussed with pt that she has refills on this prescription and she should be able to call the pharmacy for the refill. Pt verbalized understanding.

## 2015-09-17 ENCOUNTER — Encounter: Payer: Self-pay | Admitting: Internal Medicine

## 2015-09-19 DIAGNOSIS — L218 Other seborrheic dermatitis: Secondary | ICD-10-CM | POA: Diagnosis not present

## 2015-09-19 DIAGNOSIS — Z85828 Personal history of other malignant neoplasm of skin: Secondary | ICD-10-CM | POA: Diagnosis not present

## 2015-09-22 ENCOUNTER — Encounter: Payer: Self-pay | Admitting: Cardiology

## 2015-10-01 ENCOUNTER — Ambulatory Visit (INDEPENDENT_AMBULATORY_CARE_PROVIDER_SITE_OTHER): Payer: Medicare Other | Admitting: Internal Medicine

## 2015-10-01 ENCOUNTER — Encounter: Payer: Self-pay | Admitting: Internal Medicine

## 2015-10-01 ENCOUNTER — Telehealth: Payer: Self-pay | Admitting: Internal Medicine

## 2015-10-01 VITALS — BP 110/60 | HR 80 | Ht 60.0 in | Wt 133.0 lb

## 2015-10-01 DIAGNOSIS — R112 Nausea with vomiting, unspecified: Secondary | ICD-10-CM

## 2015-10-01 DIAGNOSIS — K2100 Gastro-esophageal reflux disease with esophagitis, without bleeding: Secondary | ICD-10-CM

## 2015-10-01 DIAGNOSIS — K222 Esophageal obstruction: Secondary | ICD-10-CM

## 2015-10-01 DIAGNOSIS — I208 Other forms of angina pectoris: Secondary | ICD-10-CM | POA: Diagnosis not present

## 2015-10-01 DIAGNOSIS — K449 Diaphragmatic hernia without obstruction or gangrene: Secondary | ICD-10-CM

## 2015-10-01 DIAGNOSIS — R1310 Dysphagia, unspecified: Secondary | ICD-10-CM

## 2015-10-01 NOTE — Progress Notes (Signed)
HISTORY OF PRESENT ILLNESS:  Janet Steele is a 81 y.o. female with multiple significant medical problems and multiple allergies as listed below. She is followed in this office for GERD, K to by peptic stricture. As well, chronic dysphagia secondary to significant dysmotility in addition to stricture. She is known to have a moderate-sized hiatal hernia. I last saw her 01/10/2013 or recurrent dysphagia. She underwent balloon dilation to a maximal diameter of 20 mm. She presents today with a chief complaint of vomiting at night after going to bed. She states this occurs each evening. The vomitus is mostly clear liquid. She takes pantoprazole 40 mg daily for GERD. She also complains of intermittent solid food dysphagia with transient food impaction approximate 3-4 times per month. She does feel that her prior dilation was helpful. She completes her last meal naproxen 7 PM. Retires to bed approximately 8:30 PM. Her weight has been stable since her last office evaluation GI review of systems is otherwise negative.  REVIEW OF SYSTEMS:  All non-GI ROS negative except for back pain, cough  Past Medical History  Diagnosis Date  . Hypothyroidism   . Hyperlipidemia   . GERD (gastroesophageal reflux disease)   . Anxiety   . Diabetes mellitus   . Hypertension   . PVD (peripheral vascular disease) ([place])   . Carotid bruit   . Other dysphagia   . Other esophagitis   . Hemorrhoid   . Other constipation   . Colon, diverticulosis   . Hiatal hernia   . Osteoarthrosis, unspecified whether generalized or localized, unspecified site   . Degenerative joint disease   . Esophageal stricture   . Unspecified hearing loss   . Diastolic dysfunction   . CAD (coronary artery disease)   . Shingles   . Nonspecific abnormal electrocardiogram (ECG) (EKG)   . Nonspecific abnormal electrocardiogram (ECG) (EKG)   . Colon polyp     adenomatous    Past Surgical History  Procedure Laterality Date  . Appendectomy    .  Cholecystectomy    . Abdominal hysterectomy    . Oophorectomy      bilateral  . Coronary artery bypass graft      x4(1982),02-2002 CABG X2  . Back surgery  01-2000  . Pacemaker insertion  07/06/12    MDT Adapta L implanted by Dr Rayann Heman for symptomatic bradycardia  . Permanent pacemaker insertion N/A 07/06/2012    Procedure: PERMANENT PACEMAKER INSERTION;  Surgeon: Thompson Grayer, MD;  Location: [facility] CATH LAB;  Service: Cardiovascular;  Laterality: N/A;  . Left heart catheterization with coronary/graft angiogram N/A 03/05/2014    Procedure: LEFT HEART CATHETERIZATION WITH Beatrix Fetters;  Surgeon: Sinclair Grooms, MD;  Location: [facility] CATH LAB;  Service: Cardiovascular;  Laterality: N/A;    Social History BREYA PAM  reports that she quit smoking about 39 years ago. Her smoking use included Cigarettes. She has never used smokeless tobacco. She reports that she does not drink alcohol or use illicit drugs.  family history includes Heart attack in her father; Heart disease in her sister. There is no history of Colon cancer.  Allergies  Allergen Reactions  . Codeine Rash  . Morphine Hives, Itching and Rash  . Penicillins Itching and Rash  . Prednisone Other (See Comments)    MEMORY LOSS   . Sulfonamide Derivatives Itching and Rash  . Hydrocodone-Acetaminophen Other (See Comments)    unknown  . Meperidine Hcl Itching  . Metoclopramide Hcl Itching and Rash  . Metoprolol  Succinate Other (See Comments)    nervous  . Moxifloxacin Itching  . Oxycodone-Aspirin Rash  . Pentazocine Lactate Nausea Only  . Pioglitazone Other (See Comments)    bloating  . Tramadol Hcl Other (See Comments)    Unknown, patient says she tolerates medication with no problems. 02/07/2015 TW CMA        PHYSICAL EXAMINATION: Vital signs: BP 110/60 mmHg  Pulse 80  Ht 5' (1.524 m)  Wt 133 lb (60.328 kg)  BMI 25.97 kg/m2  Constitutional: generally well-appearing, no acute distress Psychiatric: alert and  oriented x3, cooperative Eyes: extraocular movements intact, anicteric, conjunctiva pink Mouth: oral pharynx moist, no lesions Neck: supple without thyromegaly Lymph no lymphadenopathy Cardiovascular: heart regular rate and rhythm, no murmur Lungs: clear to auscultation bilaterally Abdomen: soft, obese, nontender, nondistended, no obvious ascites, no peritoneal signs, normal bowel sounds, no organomegaly Rectal: Ommitted Extremities: no clubbing cyanosis or lower extremity edema bilaterally Skin: no lesions on visible extremities Neuro: No focal deficits. Facial Tic    ASSESSMENT:  #1. GERD. #2. Nocturnal regurgitation. Mechanical process related to GERD, substantial hiatal hernia, and esophageal dysmotility. #3. Worsening intermittent solid food dysphagia. Known peptic stricture of the esophagus with most recent dilation February 2014 to 20 mm #4. Dysphagia secondary to dysmotility and stricture #5. Multiple significant medical problems  PLAN:   #1. Reflux precautions reinforced #2. Continue PPI #3. Schedule upper GI series to evaluate the upper GI anatomy. Rule out paraesophageal hernia or volvulus type anatomy #4. Schedule upper endoscopy with esophageal dilation. The patient is high-risk given her age and comorbidities.The nature of the procedure, as well as the risks, benefits, and alternatives were carefully and thoroughly reviewed with the patient. Ample time for discussion and questions allowed. The patient understood, was satisfied, and agreed to proceed.

## 2015-10-01 NOTE — Patient Instructions (Signed)
You have been scheduled for an upper GI series at [facility] on 10/09/2015 at 10:30am.  Please arrive 15 minutes early and do not eat or drink anything after midnight.  If you need to reschedule the number is [phone].  You have been scheduled for an endoscopy. Please follow written instructions given to you at your visit today. If you use inhalers (even only as needed), please bring them with you on the day of your procedure.

## 2015-10-09 ENCOUNTER — Ambulatory Visit (HOSPITAL_COMMUNITY)
Admission: RE | Admit: 2015-10-09 | Discharge: 2015-10-09 | Disposition: A | Discharge status: Home / Self Care | Payer: Medicare Other | Source: Ambulatory Visit | Attending: Internal Medicine | Admitting: Internal Medicine

## 2015-10-09 DIAGNOSIS — K449 Diaphragmatic hernia without obstruction or gangrene: Secondary | ICD-10-CM | POA: Insufficient documentation

## 2015-10-09 DIAGNOSIS — K222 Esophageal obstruction: Secondary | ICD-10-CM

## 2015-10-09 DIAGNOSIS — K219 Gastro-esophageal reflux disease without esophagitis: Secondary | ICD-10-CM | POA: Diagnosis not present

## 2015-10-16 DIAGNOSIS — E039 Hypothyroidism, unspecified: Secondary | ICD-10-CM | POA: Diagnosis not present

## 2015-10-16 DIAGNOSIS — E7800 Pure hypercholesterolemia, unspecified: Secondary | ICD-10-CM | POA: Diagnosis not present

## 2015-10-16 DIAGNOSIS — G609 Hereditary and idiopathic neuropathy, unspecified: Secondary | ICD-10-CM | POA: Diagnosis not present

## 2015-10-16 DIAGNOSIS — E1165 Type 2 diabetes mellitus with hyperglycemia: Secondary | ICD-10-CM | POA: Diagnosis not present

## 2015-10-21 ENCOUNTER — Encounter: Payer: Self-pay | Admitting: Internal Medicine

## 2015-10-21 ENCOUNTER — Ambulatory Visit (AMBULATORY_SURGERY_CENTER): Payer: Medicare Other | Admitting: Internal Medicine

## 2015-10-21 VITALS — BP 144/84 | HR 66 | Temp 96.8°F | Resp 22 | Ht 60.0 in | Wt 133.0 lb

## 2015-10-21 DIAGNOSIS — F341 Dysthymic disorder: Secondary | ICD-10-CM | POA: Diagnosis not present

## 2015-10-21 DIAGNOSIS — R1319 Other dysphagia: Secondary | ICD-10-CM

## 2015-10-21 DIAGNOSIS — K222 Esophageal obstruction: Secondary | ICD-10-CM | POA: Diagnosis not present

## 2015-10-21 DIAGNOSIS — I252 Old myocardial infarction: Secondary | ICD-10-CM | POA: Diagnosis not present

## 2015-10-21 DIAGNOSIS — E039 Hypothyroidism, unspecified: Secondary | ICD-10-CM | POA: Diagnosis not present

## 2015-10-21 DIAGNOSIS — I10 Essential (primary) hypertension: Secondary | ICD-10-CM | POA: Diagnosis not present

## 2015-10-21 DIAGNOSIS — K2100 Gastro-esophageal reflux disease with esophagitis, without bleeding: Secondary | ICD-10-CM

## 2015-10-21 DIAGNOSIS — I2510 Atherosclerotic heart disease of native coronary artery without angina pectoris: Secondary | ICD-10-CM | POA: Diagnosis not present

## 2015-10-21 DIAGNOSIS — Z951 Presence of aortocoronary bypass graft: Secondary | ICD-10-CM | POA: Diagnosis not present

## 2015-10-21 DIAGNOSIS — R112 Nausea with vomiting, unspecified: Secondary | ICD-10-CM | POA: Diagnosis not present

## 2015-10-21 DIAGNOSIS — E119 Type 2 diabetes mellitus without complications: Secondary | ICD-10-CM | POA: Diagnosis not present

## 2015-10-21 DIAGNOSIS — I739 Peripheral vascular disease, unspecified: Secondary | ICD-10-CM | POA: Diagnosis not present

## 2015-10-21 LAB — GLUCOSE, CAPILLARY
Glucose-Capillary: 125 mg/dL — ABNORMAL HIGH (ref 65–99)
Glucose-Capillary: 131 mg/dL — ABNORMAL HIGH (ref 65–99)

## 2015-10-21 MED ORDER — PANTOPRAZOLE SODIUM 40 MG PO TBEC: 40.0000 mg | DELAYED_RELEASE_TABLET | Freq: Two times a day (BID) | ORAL | Status: DC

## 2015-10-21 MED ORDER — SODIUM CHLORIDE 0.9 % IV SOLN: 500.0000 mL | INTRAVENOUS | Status: DC

## 2015-10-21 NOTE — Progress Notes (Signed)
Called to room to assist during endoscopic procedure.  Patient ID and intended procedure confirmed with present staff. Received instructions for my participation in the procedure from the performing physician.  

## 2015-10-21 NOTE — Op Note (Signed)
[facility]  [address] [place], [address]   ENDOSCOPY PROCEDURE REPORT  PATIENT: Janet Steele, Janet Steele  MR#: [id number] BIRTHDATE: 1929-11-27 , 73  yrs. old GENDER: female ENDOSCOPIST: Eustace Quail, MD REFERRED BY:  .  Self / Office PROCEDURE DATE:  10/21/2015 PROCEDURE:  EGD w/ balloon dilation -18,80mm ASA CLASS:     Class III INDICATIONS:  dysphagia. MEDICATIONS: Monitored anesthesia care and Propofol 80 mg IV TOPICAL ANESTHETIC: none  DESCRIPTION OF PROCEDURE: After the risks benefits and alternatives of the procedure were thoroughly explained, informed consent was obtained.  The LB [id number] endoscope was introduced through the mouth and advanced to the second portion of the duodenum , Without limitations.  The instrument was slowly withdrawn as the mucosa was fully examined.  EXAM:Esophagus revealed a 15 mm ringlike stricture at the gastroesophageal junction (34 cm) with associated esophagitis.  The stomach and duodenum were normal.  THERAPY: A sequential TTS balloon was used to dilate the stricture at diameters of 18 mm and 19 mm.  The ring was disrupted with heme.  Tolerated well. Retroflexed views revealed a hiatal hernia.     The scope was then withdrawn from the patient and the procedure completed.  COMPLICATIONS: There were no immediate complications.  ENDOSCOPIC IMPRESSION: 1. GERD complicated by esophagitis and peptic stricture status post dilation to 19 mm  RECOMMENDATIONS: 1.  Clear liquids until 1 PM, then soft foods rest of day.  Resume prior diet tomorrow. 2.  Increased your dosage of PPI (pantoprazole 40 mg) to twice daily 3. Follow-up as needed for recurrent problems  REPEAT EXAM:  eSigned:  Eustace Quail, MD 10/21/2015 11:17 AM    CC:The Patient and Hendricks Limes, MD

## 2015-10-21 NOTE — Patient Instructions (Signed)
YOU HAD AN ENDOSCOPIC PROCEDURE TODAY AT [facility]:   Refer to the procedure report that was given to you for any specific questions about what was found during the examination.  If the procedure report does not answer your questions, please call your gastroenterologist to clarify.  If you requested that your care partner not be given the details of your procedure findings, then the procedure report has been included in a sealed envelope for you to review at your convenience later.  YOU SHOULD EXPECT: Some feelings of bloating in the abdomen. Passage of more gas than usual.  Walking can help get rid of the air that was put into your GI tract during the procedure and reduce the bloating. If you had a lower endoscopy (such as a colonoscopy or flexible sigmoidoscopy) you may notice spotting of blood in your stool or on the toilet paper. If you underwent a bowel prep for your procedure, you may not have a normal bowel movement for a few days.  Please Note:  You might notice some irritation and congestion in your nose or some drainage.  This is from the oxygen used during your procedure.  There is no need for concern and it should clear up in a day or so.  SYMPTOMS TO REPORT IMMEDIATELY:   FFollowing upper endoscopy (EGD)  Vomiting of blood or coffee ground material  New chest pain or pain under the shoulder blades  Painful or persistently difficult swallowing  New shortness of breath  Fever of 100F or higher  Black, tarry-looking stools  For urgent or emergent issues, a gastroenterologist can be reached at any hour by calling [phone].   DIET: FOLLOW DILATATION DIET TODAY  ACTIVITY:  You should plan to take it easy for the rest of today and you should NOT DRIVE or use heavy machinery until tomorrow (because of the sedation medicines used during the test).    FOLLOW UP: Our staff will call the number listed on your records the next business day following your procedure  to check on you and address any questions or concerns that you may have regarding the information given to you following your procedure. If we do not reach you, we will leave a message.  However, if you are feeling well and you are not experiencing any problems, there is no need to return our call.  We will assume that you have returned to your regular daily activities without incident.  If any biopsies were taken you will be contacted by phone or by letter within the next 1-3 weeks.  Please call us at [phone] if you have not heard about the biopsies in 3 weeks.    SIGNATURES/CONFIDENTIALITY: You and/or your care partner have signed paperwork which will be entered into your electronic medical record.  These signatures attest to the fact that that the information above on your After Visit Summary has been reviewed and is understood.  Full responsibility of the confidentiality of this discharge information lies with you and/or your care-partner.    INCREASE PROTONIX TO TWICE DAILY  FOLLOW DILATATION DIET GIVEN TO YOU TODAY

## 2015-10-21 NOTE — Progress Notes (Signed)
Report to PACU, RN, vss, BBS= Clear.  

## 2015-10-22 ENCOUNTER — Telehealth: Payer: Self-pay | Admitting: *Deleted

## 2015-10-22 NOTE — Telephone Encounter (Signed)
  Follow up Call-  Call back number 10/21/2015  Post procedure Call Back phone  # [phone]  Permission to leave phone message Yes     Patient questions:  Do you have a fever, pain , or abdominal swelling? No. Pain Score  0 *  Have you tolerated food without any problems? Yes.    Have you been able to return to your normal activities? Yes.    Do you have any questions about your discharge instructions: Diet   No. Medications  No. Follow up visit  No.  Do you have questions or concerns about your Care? No.  Actions: * If pain score is 4 or above: No action needed, pain <4.

## 2015-11-06 ENCOUNTER — Ambulatory Visit (INDEPENDENT_AMBULATORY_CARE_PROVIDER_SITE_OTHER): Payer: Medicare Other | Admitting: Adult Health

## 2015-11-06 ENCOUNTER — Encounter: Payer: Self-pay | Admitting: Adult Health

## 2015-11-06 VITALS — BP 105/61 | HR 77 | Ht 60.0 in | Wt 137.5 lb

## 2015-11-06 DIAGNOSIS — F959 Tic disorder, unspecified: Secondary | ICD-10-CM | POA: Diagnosis not present

## 2015-11-06 DIAGNOSIS — I208 Other forms of angina pectoris: Secondary | ICD-10-CM | POA: Diagnosis not present

## 2015-11-06 DIAGNOSIS — G4700 Insomnia, unspecified: Secondary | ICD-10-CM | POA: Diagnosis not present

## 2015-11-06 DIAGNOSIS — F958 Other tic disorders: Secondary | ICD-10-CM

## 2015-11-06 DIAGNOSIS — F5104 Psychophysiologic insomnia: Secondary | ICD-10-CM

## 2015-11-06 NOTE — Patient Instructions (Signed)
Continue Trazodone If your symptoms worsen or you develop new symptoms please let us know.

## 2015-11-06 NOTE — Progress Notes (Signed)
PATIENT: Janet Steele DOB: [birth year]  REASON FOR VISIT: follow up- insomnia and motor tic HISTORY FROM: patient  HISTORY OF PRESENT ILLNESS: Ms. [name] is an 81 year old female with a history of chronic insomnia and chronic motor tic. She returns today for follow-up. She continues to take trazodone and tolerates it well. She reports that her motor tics have remained stable however they still bother her. She reports that she uses Valium for anxiety however she rarely takes this medication according to the patient. Patient reports that this week she woke up with back pain-- this has been chronic. She plans to follow-up with Dr. Ellene Route for epidural steroid injections. She states that she did take medication for her back pain however she does not remember what the medication was. She is on Ultram for pain however she states that was not the medication she took. The patient refuses to complete the memory tests stating that the medication made her feel a little off. She states that she had to have her husband drive her for that reason. She returns today for an evaluation.  HISTORY (DOHMEIER):  05-07-15 [name] is a 81 year old female with a history of chronic insomnia and chronic motor tic. She returns today for follow-up. The patient is currently taking trazodone 50 mg at bedtime. She was advised that she could take an extra 25 mg as needed. Patient also has Belsomra 50 mg but states that did not work. She reports that her insomnia has been adequately treated with trazodone. She goes to bed around 11 PM and arises at 9 AM.  The patient does have a motor tic located in the neck and head, face - Patient does have movements of the tongue and a constantly licking of her lips. She started doing this about 12 months ago. She states that its due to dry mouth and chapped lips- she states that she can control it? Otherwise no new symptoms. Her facial movements her neck movements her "" tics have increased in  amplitude and frequency and intensity. She feels quite bothered by it also is not painful it keeps her from going to sleep. This is her main complaint today. She also reports that all her life she has been on some kind of tranquilizer and she would like to rather be back on the medication with all consideration of side effects and risks. She is well able to give informed consent to use a tranquilizer. At this time I think she is really more impaired by not using a benzodiazepine rather than by the side effects that chronic benzodiazepine use may have on her cognitive and physical function.  HISTORY 09/12/14: Janet Steele is a 81 y.o. caucasian, right handed, married female , who was seen here as a referral from Dr. Linna Darner for a sleep evaluation, Now would like to address her movement disorder. She has rhythmic neck movements that wake her from sleep. Her cat "has left the usual space on the pillow and is now at her hip, for the patient a sign that these movements have begun to bother her. The patient moves her head abrupt in a twitching movement to the left and right. No tilt , no flexion. Has some leg twitching, too.  She increased her trazodone to 50 mg at night after Belsomra did not work as well.  She wants to increase it further , but I think this may be the medication that makes her twitch. She is on Cymbalta, too.  This medication mix  and her history of CAD make a narrow choice of tic treatment medication . She cannot use ORAP, CLONIDINE.  REVIEW OF SYSTEMS: Out of a complete 14 system review of symptoms, the patient complains only of the following symptoms, and all other reviewed systems are negative.  Frequency of urination, back pain, bruise/bleed easily, eye itching, hearing loss  ALLERGIES: Allergies  Allergen Reactions  . Codeine Rash  . Morphine Hives, Itching and Rash  . Penicillins Itching and Rash  . Prednisone Other (See Comments)    MEMORY LOSS   . Sulfonamide Derivatives  Itching and Rash  . Hydrocodone-Acetaminophen Other (See Comments)    unknown  . Meperidine Hcl Itching  . Metoclopramide Hcl Itching and Rash  . Metoprolol Succinate Other (See Comments)    nervous  . Moxifloxacin Itching  . Oxycodone-Aspirin Rash  . Pentazocine Lactate Nausea Only  . Pioglitazone Other (See Comments)    bloating  . Tramadol Hcl Other (See Comments)    Unknown, patient says she tolerates medication with no problems. 02/07/2015 TW CMA     HOME MEDICATIONS: Outpatient Prescriptions Prior to Visit  Medication Sig Dispense Refill  . amLODipine (NORVASC) 2.5 MG tablet Take 1 tablet (2.5 mg total) by mouth daily. 30 tablet 6  . aspirin 325 MG EC tablet Take 325 mg by mouth daily.      Marland Kitchen atorvastatin (LIPITOR) 80 MG tablet Take 1 tablet (80 mg total) by mouth daily at 6 PM. 30 tablet 5  . ciprofloxacin (CIPRO) 500 MG tablet Take 1 tablet (500 mg total) by mouth 2 (two) times daily. 20 tablet 0  . diazepam (VALIUM) 2 MG tablet take 1 tablet by mouth every 6 hours if needed for anxiety 30 tablet 3  . DULoxetine (CYMBALTA) 60 MG capsule take 1 capsule by mouth once daily 30 capsule 2  . glimepiride (AMARYL) 2 MG tablet Take 2 mg by mouth daily before breakfast.     . isosorbide mononitrate (IMDUR) 120 MG 24 hr tablet take 1 tablet by mouth once daily 30 tablet 5  . losartan (COZAAR) 50 MG tablet Take 1 tablet (50 mg total) by mouth daily. 30 tablet 5  . metFORMIN (GLUCOPHAGE) 500 MG tablet Take 1 tablet (500 mg total) by mouth daily with breakfast. 90 tablet 3  . nitroGLYCERIN (NITROSTAT) 0.4 MG SL tablet Place 1 tablet (0.4 mg total) under the tongue every 5 (five) minutes as needed. For chest pain 25 tablet 5  . pantoprazole (PROTONIX) 40 MG tablet Take 1 tablet (40 mg total) by mouth 2 (two) times daily. 30 tablet 6  . ranolazine (RANEXA) 500 MG 12 hr tablet Take 1 tablet (500 mg total) by mouth 2 (two) times daily. 60 tablet 11  . traMADol (ULTRAM) 50 MG tablet Take 50 mg  by mouth every 6 (six) hours as needed for moderate pain (back pain).   0  . traZODone (DESYREL) 50 MG tablet take 1 and 1/2 tablet by mouth at bedtime if needed 145 tablet 1  . levothyroxine (SYNTHROID, LEVOTHROID) 75 MCG tablet Take 1 tablet by mouth daily.  0   No facility-administered medications prior to visit.    PAST MEDICAL HISTORY: Past Medical History  Diagnosis Date  . Hypothyroidism   . Hyperlipidemia   . GERD (gastroesophageal reflux disease)   . Anxiety   . Diabetes mellitus   . Hypertension   . PVD (peripheral vascular disease) ([place])   . Carotid bruit   . Other dysphagia   .  Other esophagitis   . Hemorrhoid   . Other constipation   . Colon, diverticulosis   . Hiatal hernia   . Osteoarthrosis, unspecified whether generalized or localized, unspecified site   . Degenerative joint disease   . Esophageal stricture   . Unspecified hearing loss   . Diastolic dysfunction   . CAD (coronary artery disease)   . Shingles   . Nonspecific abnormal electrocardiogram (ECG) (EKG)   . Nonspecific abnormal electrocardiogram (ECG) (EKG)   . Colon polyp     adenomatous  . Allergy     seasonal  . Anemia   . Myocardial infarction ([place])     20 years ago    PAST SURGICAL HISTORY: Past Surgical History  Procedure Laterality Date  . Appendectomy    . Cholecystectomy    . Abdominal hysterectomy    . Oophorectomy      bilateral  . Coronary artery bypass graft      x4(1982),02-2002 CABG X2  . Back surgery  01-2000  . Pacemaker insertion  07/06/12    MDT Adapta L implanted by Dr Rayann Heman for symptomatic bradycardia  . Permanent pacemaker insertion N/A 07/06/2012    Procedure: PERMANENT PACEMAKER INSERTION;  Surgeon: Thompson Grayer, MD;  Location: [facility] CATH LAB;  Service: Cardiovascular;  Laterality: N/A;  . Left heart catheterization with coronary/graft angiogram N/A 03/05/2014    Procedure: LEFT HEART CATHETERIZATION WITH Beatrix Fetters;  Surgeon: Sinclair Grooms, MD;   Location: [facility] CATH LAB;  Service: Cardiovascular;  Laterality: N/A;  . Colonoscopy      FAMILY HISTORY: Family History  Problem Relation Age of Onset  . Heart disease Sister   . Heart attack Sister   . Colon cancer Neg Hx   . Breast cancer Neg Hx   . Celiac disease Neg Hx   . Cirrhosis Neg Hx   . Clotting disorder Neg Hx   . Colitis Neg Hx   . Colon polyps Neg Hx   . Crohn's disease Neg Hx   . Cystic fibrosis Neg Hx   . Diabetes Neg Hx   . Esophageal cancer Neg Hx   . Hemochromatosis Neg Hx   . Inflammatory bowel disease Neg Hx   . Irritable bowel syndrome Neg Hx   . Kidney disease Neg Hx   . Liver cancer Neg Hx   . Liver disease Neg Hx   . Ovarian cancer Neg Hx   . Pancreatic cancer Neg Hx   . Prostate cancer Neg Hx   . Rectal cancer Neg Hx   . Stomach cancer Neg Hx   . Ulcerative colitis Neg Hx   . Uterine cancer Neg Hx   . Wilson's disease Neg Hx     SOCIAL HISTORY: Social History   Social History  . Marital Status: Married    Spouse Name: [name]  . Number of Children: 3  . Years of Education: 16   Occupational History  . Retired    .     Social History Main Topics  . Smoking status: Former Smoker    Types: Cigarettes    Quit date: 07/05/1976  . Smokeless tobacco: Never Used     Comment: smoked age18- 1977, up to 1/2 ppd  . Alcohol Use: 0.0 oz/week    0 Standard drinks or equivalent per week     Comment: Glass a wine a week  . Drug Use: No  . Sexual Activity: No   Other Topics Concern  . Not on file  Social History Narrative   Patient is married [name]) and lives at home with her husband.   Patient has three adult children.   Patient is retired.   Patient has a college education.   Patient is right-handed.   Patient does not drink any caffeine.      PHYSICAL EXAM  Filed Vitals:   11/06/15 1311  BP: 105/61  Pulse: 77  Height: 5' (1.524 m)  Weight: 137 lb 8 oz (62.37 kg)   Body mass index is 26.85 kg/(m^2).  Generalized: Well  developed, in no acute distress   Neurological examination  Mentation: Alert. Follows all commands speech and language fluent. Hard of hearing Cranial nerve II-XII: Pupils were equal round reactive to light. Extraocular movements were full, visual field were full on confrontational test. Facial sensation and strength were normal. Uvula tongue midline. Head turning and shoulder shrug  were normal and symmetric. Motor: The motor testing reveals 5 over 5 strength of all 4 extremities. Good symmetric motor tone is noted throughout.  Sensory: Sensory testing is intact to soft touch on all 4 extremities. No evidence of extinction is noted.  Coordination: Cerebellar testing reveals good finger-nose-finger and heel-to-shin bilaterally.  Gait and station: Gait is normal. Tandem gait not attempted.. Romberg is negative. No drift is seen.  Reflexes: Deep tendon reflexes are symmetric and normal bilaterally.   DIAGNOSTIC DATA (LABS, IMAGING, TESTING) - I reviewed patient records, labs, notes, testing and imaging myself where available.  Lab Results  Component Value Date   WBC 7.7 05/27/2015   HGB 14.4 05/27/2015   HCT 42.4 05/27/2015   MCV 87.3 05/27/2015   PLT 327.0 05/27/2015      Component Value Date/Time   NA 135 05/27/2015 1236   K 3.8 05/27/2015 1236   CL 99 05/27/2015 1236   CO2 28 05/27/2015 1236   GLUCOSE 205* 05/27/2015 1236   BUN 14 05/27/2015 1236   CREATININE 1.14 05/27/2015 1236   CALCIUM 9.7 05/27/2015 1236   PROT 7.2 05/27/2015 1236   ALBUMIN 4.2 05/27/2015 1236   AST 16 05/27/2015 1236   ALT 16 05/27/2015 1236   ALKPHOS 85 05/27/2015 1236   BILITOT 0.8 05/27/2015 1236   GFRNONAA 67* 03/03/2014 0330   GFRAA 77* 03/03/2014 0330   Lab Results  Component Value Date   CHOL 212* 05/15/2014   HDL 32.80* 05/15/2014   LDLCALC 143* 05/15/2014   LDLDIRECT 98.6 02/23/2010   TRIG 182.0* 05/15/2014   CHOLHDL 6 05/15/2014   Lab Results  Component Value Date   HGBA1C 7.1*  05/27/2015   Lab Results  Component Value Date   VITAMINB12 437 11/12/2011   Lab Results  Component Value Date   TSH 25.018* 07/05/2012      ASSESSMENT AND PLAN 81 y.o. year old female  has a past medical history of Hypothyroidism; Hyperlipidemia; GERD (gastroesophageal reflux disease); Anxiety; Diabetes mellitus; Hypertension; PVD (peripheral vascular disease) ([place]); Carotid bruit; Other dysphagia; Other esophagitis; Hemorrhoid; Other constipation; Colon, diverticulosis; Hiatal hernia; Osteoarthrosis, unspecified whether generalized or localized, unspecified site; Degenerative joint disease; Esophageal stricture; Unspecified hearing loss; Diastolic dysfunction; CAD (coronary artery disease); Shingles; Nonspecific abnormal electrocardiogram (ECG) (EKG); Nonspecific abnormal electrocardiogram (ECG) (EKG); Colon polyp; Allergy; Anemia; and Myocardial infarction ([place]). here with:  1. Insomnia 2. Motor tic  The patient will continue on trazodone for chronic insomnia. Patient's motor tic has remained stable. I have requested that the patient call us and let us know what medication she took prior to the visit today.  I do suspect that the patient may have memory disturbance possibly related to medication? I advised the patient that at the next visit we would like to complete memory testing and for that reason she should not take any sedating medication before the visit. Patient verbalized understanding. She will follow-up in 6 months or sooner if needed.    Ward Givens, MSN, NP-C 11/06/2015, 1:37 PM [facility] [address] [place], [place] [address] [phone]

## 2015-11-06 NOTE — Progress Notes (Signed)
I agree with the assessment and plan as directed by NP .The patient is known to me .   Mariaguadalupe Fialkowski, MD  

## 2015-11-17 ENCOUNTER — Telehealth: Payer: Self-pay | Admitting: Neurology

## 2015-11-17 NOTE — Telephone Encounter (Signed)
Pt called and wants to speak with Dr. Allie Bossier nurse, pt stated that she was seen by Jinny Blossom just a couple weeks ago but she is wanting to get in to see Dr. Brett Fairy for some other issues and she does not want to see Megan. Please call pt.

## 2015-11-17 NOTE — Telephone Encounter (Signed)
Spoke to pt. She says that she wants to see Dr. Brett Fairy. Pt is very reluctant to tell me what she wants to see Dr. Brett Fairy about, just that she needs to see Dr. Brett Fairy about some new issues. Her PCP retired this Friday. An appt was made for pt tomorrow at 3:30. Pt verbalized understanding.

## 2015-11-18 ENCOUNTER — Ambulatory Visit (INDEPENDENT_AMBULATORY_CARE_PROVIDER_SITE_OTHER): Payer: Medicare Other | Admitting: Neurology

## 2015-11-18 ENCOUNTER — Encounter: Payer: Self-pay | Admitting: Neurology

## 2015-11-18 VITALS — BP 100/60 | HR 88 | Resp 20 | Ht 60.0 in | Wt 137.0 lb

## 2015-11-18 DIAGNOSIS — G3184 Mild cognitive impairment, so stated: Secondary | ICD-10-CM | POA: Diagnosis not present

## 2015-11-18 DIAGNOSIS — F03918 Unspecified dementia, unspecified severity, with other behavioral disturbance: Secondary | ICD-10-CM | POA: Insufficient documentation

## 2015-11-18 DIAGNOSIS — I208 Other forms of angina pectoris: Secondary | ICD-10-CM | POA: Diagnosis not present

## 2015-11-18 MED ORDER — TRAZODONE HCL 50 MG PO TABS: 50.0000 mg | ORAL_TABLET | Freq: Every evening | ORAL | Status: DC | PRN

## 2015-11-18 NOTE — Patient Instructions (Signed)
Insomnia Insomnia is a sleep disorder that makes it difficult to fall asleep or to stay asleep. Insomnia can cause tiredness (fatigue), low energy, difficulty concentrating, mood swings, and poor performance at work or school.  There are three different ways to classify insomnia:  Difficulty falling asleep.  Difficulty staying asleep.  Waking up too early in the morning. Any type of insomnia can be long-term (chronic) or short-term (acute). Both are common. Short-term insomnia usually lasts for three months or less. Chronic insomnia occurs at least three times a week for longer than three months. CAUSES  Insomnia may be caused by another condition, situation, or substance, such as:  Anxiety.  Certain medicines.  Gastroesophageal reflux disease (GERD) or other gastrointestinal conditions.  Asthma or other breathing conditions.  Restless legs syndrome, sleep apnea, or other sleep disorders.  Chronic pain.  Menopause. This may include hot flashes.  Stroke.  Abuse of alcohol, tobacco, or illegal drugs.  Depression.  Caffeine.   Neurological disorders, such as Alzheimer disease.  An overactive thyroid (hyperthyroidism). The cause of insomnia may not be known. RISK FACTORS Risk factors for insomnia include:  Gender. Women are more commonly affected than men.  Age. Insomnia is more common as you get older.  Stress. This may involve your professional or personal life.  Income. Insomnia is more common in people with lower income.  Lack of exercise.   Irregular work schedule or night shifts.  Traveling between different time zones. SIGNS AND SYMPTOMS If you have insomnia, trouble falling asleep or trouble staying asleep is the main symptom. This may lead to other symptoms, such as:  Feeling fatigued.  Feeling nervous about going to sleep.  Not feeling rested in the morning.  Having trouble concentrating.  Feeling irritable, anxious, or depressed. TREATMENT   Treatment for insomnia depends on the cause. If your insomnia is caused by an underlying condition, treatment will focus on addressing the condition. Treatment may also include:   Medicines to help you sleep.  Counseling or therapy.  Lifestyle adjustments. HOME CARE INSTRUCTIONS   Take medicines only as directed by your health care provider.  Keep regular sleeping and waking hours. Avoid naps.  Keep a sleep diary to help you and your health care provider figure out what could be causing your insomnia. Include:   When you sleep.  When you wake up during the night.  How well you sleep.   How rested you feel the next day.  Any side effects of medicines you are taking.  What you eat and drink.   Make your bedroom a comfortable place where it is easy to fall asleep:  Put up shades or special blackout curtains to block light from outside.  Use a white noise machine to block noise.  Keep the temperature cool.   Exercise regularly as directed by your health care provider. Avoid exercising right before bedtime.  Use relaxation techniques to manage stress. Ask your health care provider to suggest some techniques that may work well for you. These may include:  Breathing exercises.  Routines to release muscle tension.  Visualizing peaceful scenes.  Cut back on alcohol, caffeinated beverages, and cigarettes, especially close to bedtime. These can disrupt your sleep.  Do not overeat or eat spicy foods right before bedtime. This can lead to digestive discomfort that can make it hard for you to sleep.  Limit screen use before bedtime. This includes:  Watching TV.  Using your smartphone, tablet, and computer.  Stick to a routine. This   can help you fall asleep faster. Try to do a quiet activity, brush your teeth, and go to bed at the same time each night.  Get out of bed if you are still awake after 15 minutes of trying to sleep. Keep the lights down, but try reading or  doing a quiet activity. When you feel sleepy, go back to bed.  Make sure that you drive carefully. Avoid driving if you feel very sleepy.  Keep all follow-up appointments as directed by your health care provider. This is important. SEEK MEDICAL CARE IF:   You are tired throughout the day or have trouble in your daily routine due to sleepiness.  You continue to have sleep problems or your sleep problems get worse. SEEK IMMEDIATE MEDICAL CARE IF:   You have serious thoughts about hurting yourself or someone else.   This information is not intended to replace advice given to you by your health care provider. Make sure you discuss any questions you have with your health care provider.   Document Released: 11/19/2000 Document Revised: 08/13/2015 Document Reviewed: 08/23/2014 Elsevier Interactive Patient Education 2016 [employer]  

## 2015-11-18 NOTE — Progress Notes (Signed)
PATIENT: Janet Steele DOB: 1929/11/16  REASON FOR VISIT: follow up- insomnia and motor tic HISTORY FROM: patient  HISTORY OF PRESENT ILLNESS:     11-18-15  The patient reports that her sleep has been doing well she feels that her insomnia has been well controlled.  She feels that she is getting more forgetfull.  She has no recent bouts of angina and she states she doesn't have HTN. She requested me to go through her medication list with her, she is taking losartan for high blood pressure but feels that she does not have high blood pressure.  Nitroglycerin for angina, and omeprazole for gastric reflux, Ranexa, tramadol for back pain as needed, trazodone 50 mg for sleep. She reports her PCP, Dr. Linna Darner , has retired and she is concerned about finding a new one.      MMSE - Mini Mental State Exam 11/18/2015 11/06/2015  Not completed: - Unable to complete  Orientation to time 4 -  Orientation to Place 5 -  Registration 3 -  Attention/ Calculation 5 -  Recall 0 -  Language- name 2 objects 2 -  Language- repeat 0 -  Language- follow 3 step command 3 -  Language- read & follow direction 0 -  Language-read & follow direction-comments PT read the sentence twice but did not perform the task. -  Write a sentence 1 -  Copy design 0 -  Total score 23 -   She wrote a short note under her memory tests stating that she does not like this test!. She could not copy an image the interlocking pentagons were not meeting her clinical fluency test was only 5 points the clock face however was 4 out of 4 points. She sees Dr. Redmond Pulling for diabetes which is well controlled. She also takes thyroid medication.   HISTORY [facility]): Janet Steele is an 81 year old female with a history of chronic insomnia and chronic motor tic. She returns today for follow-up. She continues to take trazodone and tolerates it well. She reports that her motor tics have remained stable however they still bother her. She  reports that she uses Valium for anxiety however she rarely takes this medication according to the patient. Patient reports that this week she woke up with back pain-- this has been chronic. She plans to follow-up with Dr. Ellene Route for epidural steroid injections. She states that she did take medication for her back pain however she does not remember what the medication was. She is on Ultram for pain however she states that was not the medication she took. The patient refuses to complete the memory tests stating that the medication made her feel a little off. She states that she had to have her husband drive her for that reason. She returns today for an evaluation. 05-07-15 Janet Steele is a 81 year old female with a history of chronic insomnia and chronic motor tic. She returns today for follow-up. The patient is currently taking trazodone 50 mg at bedtime. She was advised that she could take an extra 25 mg as needed. Patient also has Belsomra 50 mg but states that did not work. She reports that her insomnia has been adequately treated with trazodone. She goes to bed around 11 PM and arises at 9 AM.  The patient does have a motor tic located in the neck and head, face - Patient does have movements of the tongue and a constantly licking of her lips. She started doing this about 12 months ago. She states  that its due to dry mouth and chapped lips- she states that she can control it? Otherwise no new symptoms. Her facial movements her neck movements her "" tics have increased in amplitude and frequency and intensity. She feels quite bothered by it also is not painful it keeps her from going to sleep. This is her main complaint today. She also reports that all her life she has been on some kind of tranquilizer and she would like to rather be back on the medication with all consideration of side effects and risks. She is well able to give informed consent to use a tranquilizer. At this time I think she is really more  impaired by not using a benzodiazepine rather than by the side effects that chronic benzodiazepine use may have on her cognitive and physical function.  HISTORY 09/12/14: Janet Steele is a 81 y.o. caucasian, right handed, married female , who was seen here as a referral from Dr. Linna Darner for a sleep evaluation, Now would like to address her movement disorder. She has rhythmic neck movements that wake her from sleep. Her cat "has left the usual space on the pillow and is now at her hip, for the patient a sign that these movements have begun to bother her. The patient moves her head abrupt in a twitching movement to the left and right. No tilt , no flexion. Has some leg twitching, too.  She increased her trazodone to 50 mg at night after Belsomra did not work as well.  She wants to increase it further , but I think this may be the medication that makes her twitch. She is on Cymbalta, too.  This medication mix and her history of CAD make a narrow choice of tic treatment medication . She cannot use ORAP, CLONIDINE.  REVIEW OF SYSTEMS: Out of a complete 14 system review of symptoms, the patient complains only of the following symptoms, and all other reviewed systems are negative.  Frequency of urination, back pain, bruise/bleed easily, eye itching, hearing loss, forgetfulness.  She endorsed today the Epworth sleepiness score at 10 points she did not endorse a fatigue score.  Geriatric depression score at 3 points. Mini-mental status exam at 23 points.   ALLERGIES: Allergies  Allergen Reactions  . Codeine Rash  . Morphine Hives, Itching and Rash  . Penicillins Itching and Rash  . Prednisone Other (See Comments)    MEMORY LOSS   . Sulfonamide Derivatives Itching and Rash  . Hydrocodone-Acetaminophen Other (See Comments)    unknown  . Meperidine Hcl Itching  . Metoclopramide Hcl Itching and Rash  . Metoprolol Succinate Other (See Comments)    nervous  . Moxifloxacin Itching  .  Oxycodone-Aspirin Rash  . Pentazocine Lactate Nausea Only  . Pioglitazone Other (See Comments)    bloating  . Tramadol Hcl Other (See Comments)    Unknown, patient says she tolerates medication with no problems. 02/07/2015 TW CMA     HOME MEDICATIONS: Outpatient Prescriptions Prior to Visit  Medication Sig Dispense Refill  . amLODipine (NORVASC) 2.5 MG tablet Take 1 tablet (2.5 mg total) by mouth daily. 30 tablet 6  . aspirin 325 MG EC tablet Take 325 mg by mouth daily.      Marland Kitchen atorvastatin (LIPITOR) 80 MG tablet Take 1 tablet (80 mg total) by mouth daily at 6 PM. 30 tablet 5  . ciprofloxacin (CIPRO) 500 MG tablet Take 1 tablet (500 mg total) by mouth 2 (two) times daily. 20 tablet 0  .  diazepam (VALIUM) 2 MG tablet take 1 tablet by mouth every 6 hours if needed for anxiety 30 tablet 3  . DULoxetine (CYMBALTA) 60 MG capsule take 1 capsule by mouth once daily 30 capsule 2  . glimepiride (AMARYL) 2 MG tablet Take 2 mg by mouth daily before breakfast.     . isosorbide mononitrate (IMDUR) 120 MG 24 hr tablet take 1 tablet by mouth once daily 30 tablet 5  . levothyroxine (SYNTHROID, LEVOTHROID) 88 MCG tablet Take 88 mcg by mouth daily.  0  . losartan (COZAAR) 50 MG tablet Take 1 tablet (50 mg total) by mouth daily. 30 tablet 5  . metFORMIN (GLUCOPHAGE) 500 MG tablet Take 1 tablet (500 mg total) by mouth daily with breakfast. 90 tablet 3  . nitroGLYCERIN (NITROSTAT) 0.4 MG SL tablet Place 1 tablet (0.4 mg total) under the tongue every 5 (five) minutes as needed. For chest pain 25 tablet 5  . pantoprazole (PROTONIX) 40 MG tablet Take 1 tablet (40 mg total) by mouth 2 (two) times daily. 30 tablet 6  . ranolazine (RANEXA) 500 MG 12 hr tablet Take 1 tablet (500 mg total) by mouth 2 (two) times daily. 60 tablet 11  . traMADol (ULTRAM) 50 MG tablet Take 50 mg by mouth every 6 (six) hours as needed for moderate pain (back pain).   0  . traZODone (DESYREL) 50 MG tablet take 1 and 1/2 tablet by mouth at  bedtime if needed 145 tablet 1   No facility-administered medications prior to visit.    PAST MEDICAL HISTORY: Past Medical History  Diagnosis Date  . Hypothyroidism   . Hyperlipidemia   . GERD (gastroesophageal reflux disease)   . Anxiety   . Diabetes mellitus   . Hypertension   . PVD (peripheral vascular disease) ([place])   . Carotid bruit   . Other dysphagia   . Other esophagitis   . Hemorrhoid   . Other constipation   . Colon, diverticulosis   . Hiatal hernia   . Osteoarthrosis, unspecified whether generalized or localized, unspecified site   . Degenerative joint disease   . Esophageal stricture   . Unspecified hearing loss   . Diastolic dysfunction   . CAD (coronary artery disease)   . Shingles   . Nonspecific abnormal electrocardiogram (ECG) (EKG)   . Nonspecific abnormal electrocardiogram (ECG) (EKG)   . Colon polyp     adenomatous  . Allergy     seasonal  . Anemia   . Myocardial infarction ([place])     20 years ago    PAST SURGICAL HISTORY: Past Surgical History  Procedure Laterality Date  . Appendectomy    . Cholecystectomy    . Abdominal hysterectomy    . Oophorectomy      bilateral  . Coronary artery bypass graft      x4(1982),02-2002 CABG X2  . Back surgery  01-2000  . Pacemaker insertion  07/06/12    MDT Adapta L implanted by Dr Rayann Heman for symptomatic bradycardia  . Permanent pacemaker insertion N/A 07/06/2012    Procedure: PERMANENT PACEMAKER INSERTION;  Surgeon: Thompson Grayer, MD;  Location: [facility] CATH LAB;  Service: Cardiovascular;  Laterality: N/A;  . Left heart catheterization with coronary/graft angiogram N/A 03/05/2014    Procedure: LEFT HEART CATHETERIZATION WITH Beatrix Fetters;  Surgeon: Sinclair Grooms, MD;  Location: [facility] C[facility] LAB;  Service: Cardiovascular;  Laterality: N/A;  . Colonoscopy      FAMILY HISTORY: Family History  Problem Relation Age of Onset  .  Heart disease Sister   . Heart attack Sister   . Colon cancer Neg Hx   .  Breast cancer Neg Hx   . Celiac disease Neg Hx   . Cirrhosis Neg Hx   . Clotting disorder Neg Hx   . Colitis Neg Hx   . Colon polyps Neg Hx   . Crohn's disease Neg Hx   . Cystic fibrosis Neg Hx   . Diabetes Neg Hx   . Esophageal cancer Neg Hx   . Hemochromatosis Neg Hx   . Inflammatory bowel disease Neg Hx   . Irritable bowel syndrome Neg Hx   . Kidney disease Neg Hx   . Liver cancer Neg Hx   . Liver disease Neg Hx   . Ovarian cancer Neg Hx   . Pancreatic cancer Neg Hx   . Prostate cancer Neg Hx   . Rectal cancer Neg Hx   . Stomach cancer Neg Hx   . Ulcerative colitis Neg Hx   . Uterine cancer Neg Hx   . Wilson's disease Neg Hx     SOCIAL HISTORY: Social History   Social History  . Marital Status: Married    Spouse Name: [name]  . Number of Children: 3  . Years of Education: 16   Occupational History  . Retired    .     Social History Main Topics  . Smoking status: Former Smoker    Types: Cigarettes    Quit date: 07/05/1976  . Smokeless tobacco: Never Used     Comment: smoked age18- 1977, up to 1/2 ppd  . Alcohol Use: 0.0 oz/week    0 Standard drinks or equivalent per week     Comment: Glass a wine a week  . Drug Use: No  . Sexual Activity: No   Other Topics Concern  . Not on file   Social History Narrative   Patient is married [name]) and lives at home with her husband.   Patient has three adult children.   Patient is retired.   Patient has a college education.   Patient is right-handed.   Patient does not drink any caffeine.      PHYSICAL EXAM  Filed Vitals:   11/18/15 1533  BP: 100/60  Pulse: 88  Resp: 20  Height: 5' (1.524 m)  Weight: 137 lb (62.143 kg)   Body mass index is 26.76 kg/(m^2).  Generalized: Well developed, in no acute distress   Neurological examination  Mentation: Alert. Follows all commands speech and language fluent. Hard of hearing Cranial nerve II-XII: Pupils were equal round reactive to light. Extraocular  movements were full, visual field were full on confrontational test. Facial sensation and strength were normal. Uvula tongue midline. Head turning and shoulder shrug  were normal and symmetric. Motor: The motor testing reveals 5 over 5 strength of all 4 extremities. Good symmetric motor tone is noted throughout.  Sensory: Sensory testing is intact to soft touch on all 4 extremities. No evidence of extinction is noted.  Coordination: Cerebellar testing reveals good finger-nose-finger and heel-to-shin bilaterally.  Gait and station: Gait is normal. Tandem gait not attempted.. Romberg is negative. No drift is seen.  Reflexes: Deep tendon reflexes are symmetric and normal bilaterally.   DIAGNOSTIC DATA (LABS, IMAGING, TESTING) - I reviewed patient records, labs, notes, testing and imaging myself where available.  Lab Results  Component Value Date   WBC 7.7 05/27/2015   HGB 14.4 05/27/2015   HCT 42.4 05/27/2015   MCV 87.3 05/27/2015  PLT 327.0 05/27/2015      Component Value Date/Time   NA 135 05/27/2015 1236   K 3.8 05/27/2015 1236   CL 99 05/27/2015 1236   CO2 28 05/27/2015 1236   GLUCOSE 205* 05/27/2015 1236   BUN 14 05/27/2015 1236   CREATININE 1.14 05/27/2015 1236   CALCIUM 9.7 05/27/2015 1236   PROT 7.2 05/27/2015 1236   ALBUMIN 4.2 05/27/2015 1236   AST 16 05/27/2015 1236   ALT 16 05/27/2015 1236   ALKPHOS 85 05/27/2015 1236   BILITOT 0.8 05/27/2015 1236   GFRNONAA 67* 03/03/2014 0330   GFRAA 77* 03/03/2014 0330    Lab Results  Component Value Date   TSH 25.018* 07/05/2012     ASSESSMENT AND PLAN 81 y.o. year old Caucasian right-handed female, married, with a history of chronic insomnia currently well controlled on tramadol. She has some mild memory loss more a memory delay on today's Mini-Mental Status Examination she reached 24 out of 30 points in a repeat test. She did not recall any of the 3 recall words which was her main difficulty. Seen today here in a 20  minute revisit was more than 50% of the face to face time dedicated to medication regimen, questions about the independent medications and if she needs to take all of them.   Coordination of care will need to change as her current primary care physician, Dr. Unice Cobble, has retired. She will need a new primary care physician. I have given her 3 names that she hopefully can continue her current care with.  She is today here with no new neurologic complaints, except for a delayed recall. I will refill her trazodone.  Her motor tic is not disturbing her today and is actually almost invisible to me today. She has done very well with that.  1. Insomnia- controlled  2. Motor tic- improved   The patient will continue on trazodone for chronic insomnia.  Patient's motor tic has remained stable. I have requested that the patient call us and let us know what medication she took prior to the visit today. I do suspect that the patient may have memory disturbance possibly related to medication? I advised the patient that at the next visit we would like to complete memory testing and for that reason she should not take any sedating medication before the visit. Patient verbalized understanding. She will follow-up in 12 months or sooner if needed.   Deyona Soza, MD    11/18/2015, 3:48 PM [facility] [address] [place], [place] [address] [phone]

## 2015-11-24 ENCOUNTER — Other Ambulatory Visit: Payer: Self-pay | Admitting: Cardiovascular Disease

## 2015-11-27 ENCOUNTER — Telehealth: Payer: Self-pay | Admitting: Cardiology

## 2015-11-27 ENCOUNTER — Ambulatory Visit (INDEPENDENT_AMBULATORY_CARE_PROVIDER_SITE_OTHER): Payer: Medicare Other | Admitting: *Deleted

## 2015-11-27 DIAGNOSIS — I495 Sick sinus syndrome: Secondary | ICD-10-CM

## 2015-11-27 NOTE — Telephone Encounter (Signed)
Confirmed remote transmission w/ pt husband.   

## 2015-11-27 NOTE — Progress Notes (Signed)
Remote pacemaker transmission.   

## 2015-12-02 LAB — CUP PACEART REMOTE DEVICE CHECK
Battery Remaining Longevity: 127 mo
Brady Statistic AS VS Percent: 10 %
Implantable Lead Implant Date: 20130801
Implantable Lead Location: 753859
Implantable Lead Location: 753860
Lead Channel Pacing Threshold Amplitude: 0.875 V
Lead Channel Pacing Threshold Pulse Width: 0.4 ms
Lead Channel Sensing Intrinsic Amplitude: 16 mV
Lead Channel Setting Pacing Amplitude: 2 V
Lead Channel Setting Pacing Pulse Width: 0.4 ms
Lead Channel Setting Sensing Sensitivity: 5.6 mV
MDC IDC LEAD IMPLANT DT: 20130801
MDC IDC MSMT BATTERY IMPEDANCE: 157 Ohm
MDC IDC MSMT BATTERY VOLTAGE: 2.79 V
MDC IDC MSMT LEADCHNL RA IMPEDANCE VALUE: 451 Ohm
MDC IDC MSMT LEADCHNL RA PACING THRESHOLD AMPLITUDE: 0.875 V
MDC IDC MSMT LEADCHNL RA PACING THRESHOLD PULSEWIDTH: 0.4 ms
MDC IDC MSMT LEADCHNL RV IMPEDANCE VALUE: 546 Ohm
MDC IDC SESS DTM: 20161222161517
MDC IDC SET LEADCHNL RV PACING AMPLITUDE: 2.5 V
MDC IDC STAT BRADY AP VP PERCENT: 0 %
MDC IDC STAT BRADY AP VS PERCENT: 90 %
MDC IDC STAT BRADY AS VP PERCENT: 0 %

## 2015-12-04 ENCOUNTER — Encounter: Payer: Self-pay | Admitting: *Deleted

## 2015-12-04 ENCOUNTER — Encounter: Payer: Self-pay | Admitting: Cardiology

## 2015-12-10 ENCOUNTER — Other Ambulatory Visit: Payer: Self-pay | Admitting: Neurological Surgery

## 2015-12-10 DIAGNOSIS — M4317 Spondylolisthesis, lumbosacral region: Secondary | ICD-10-CM

## 2015-12-12 ENCOUNTER — Encounter: Payer: Self-pay | Admitting: Cardiology

## 2015-12-12 ENCOUNTER — Ambulatory Visit (INDEPENDENT_AMBULATORY_CARE_PROVIDER_SITE_OTHER): Payer: Medicare Other | Admitting: Cardiology

## 2015-12-12 VITALS — BP 136/80 | HR 82 | Ht 60.0 in | Wt 136.0 lb

## 2015-12-12 DIAGNOSIS — R001 Bradycardia, unspecified: Secondary | ICD-10-CM

## 2015-12-12 DIAGNOSIS — M79643 Pain in unspecified hand: Secondary | ICD-10-CM

## 2015-12-12 DIAGNOSIS — I10 Essential (primary) hypertension: Secondary | ICD-10-CM

## 2015-12-12 DIAGNOSIS — I25810 Atherosclerosis of coronary artery bypass graft(s) without angina pectoris: Secondary | ICD-10-CM | POA: Diagnosis not present

## 2015-12-12 DIAGNOSIS — Z950 Presence of cardiac pacemaker: Secondary | ICD-10-CM

## 2015-12-12 DIAGNOSIS — E782 Mixed hyperlipidemia: Secondary | ICD-10-CM

## 2015-12-12 DIAGNOSIS — I498 Other specified cardiac arrhythmias: Secondary | ICD-10-CM | POA: Diagnosis not present

## 2015-12-12 DIAGNOSIS — I25708 Atherosclerosis of coronary artery bypass graft(s), unspecified, with other forms of angina pectoris: Secondary | ICD-10-CM

## 2015-12-12 LAB — CBC WITH DIFFERENTIAL/PLATELET
BASOS ABS: 0 10*3/uL (ref 0.0–0.1)
BASOS PCT: 0 % (ref 0–1)
EOS ABS: 0.2 10*3/uL (ref 0.0–0.7)
Eosinophils Relative: 3 % (ref 0–5)
HCT: 39.9 % (ref 36.0–46.0)
Hemoglobin: 13.3 g/dL (ref 12.0–15.0)
LYMPHS ABS: 1.6 10*3/uL (ref 0.7–4.0)
Lymphocytes Relative: 23 % (ref 12–46)
MCH: 27.8 pg (ref 26.0–34.0)
MCHC: 33.3 g/dL (ref 30.0–36.0)
MCV: 83.5 fL (ref 78.0–100.0)
MPV: 9.6 fL (ref 8.6–12.4)
Monocytes Absolute: 0.6 10*3/uL (ref 0.1–1.0)
Monocytes Relative: 8 % (ref 3–12)
NEUTROS PCT: 66 % (ref 43–77)
Neutro Abs: 4.6 10*3/uL (ref 1.7–7.7)
PLATELETS: 312 10*3/uL (ref 150–400)
RBC: 4.78 MIL/uL (ref 3.87–5.11)
RDW: 14.8 % (ref 11.5–15.5)
WBC: 6.9 10*3/uL (ref 4.0–10.5)

## 2015-12-12 LAB — COMPREHENSIVE METABOLIC PANEL
ALK PHOS: 114 U/L (ref 33–130)
ALT: 14 U/L (ref 6–29)
AST: 16 U/L (ref 10–35)
Albumin: 3.9 g/dL (ref 3.6–5.1)
BILIRUBIN TOTAL: 0.7 mg/dL (ref 0.2–1.2)
BUN: 13 mg/dL (ref 7–25)
CO2: 27 mmol/L (ref 20–31)
CREATININE: 1 mg/dL — AB (ref 0.60–0.88)
Calcium: 8.8 mg/dL (ref 8.6–10.4)
Chloride: 100 mmol/L (ref 98–110)
GLUCOSE: 259 mg/dL — AB (ref 65–99)
Potassium: 4.2 mmol/L (ref 3.5–5.3)
SODIUM: 136 mmol/L (ref 135–146)
Total Protein: 6.5 g/dL (ref 6.1–8.1)

## 2015-12-12 NOTE — Patient Instructions (Addendum)
Medication Instructions: Your physician recommends that you continue on your current medications as directed. Please refer to the Current Medication list given to you today.   Labwork: TODAY:  CBC W/DIFF                 TSH                 CMP   Testing/Procedures: None ordered  Follow-Up: Your physician recommends that you keep your scheduled follow-up appointments as planned.   Any Other Special Instructions Will Be Listed Below (If Applicable).     If you need a refill on your cardiac medications before your next appointment, please call your pharmacy.

## 2015-12-12 NOTE — Progress Notes (Signed)
Cardiology Office Note   Date:  12/12/2015   ID:  Janet Steele, DOB [birth year], MRN [id number]  PCP:  Binnie Rail, MD  Cardiologist: Dr. Burt Knack    Chief Complaint  Patient presents with  . Hand Pain    hands swollen and red then white over last month      History of Present Illness: Janet Steele is a 82 y.o. female who presents for eval after calling in for cold hands and swollen red hands.   She has a history of extensive CAD status post redo CABG, initially in 1982 and most recent surgery in 2003. She most recently presented with non-STEMI in April of 2016. She underwent cardiac catheterization demonstrating occlusion of her native coronary arteries with continued patency of the LIMA to LAD and saphenous vein graft to PDA. There is diffuse stenosis between the PDA and the 2 LV branches of the distal right coronary artery. There were collaterals supply the left circumflex distribution. There were no targets amenable to revascularization. Since that time, she has been managed with aspirin, Imdur, atorvastatin and Cozaar. She is not on beta blocker therapy due to allergy/intolerance. Ranexa was added to her medical regimen earlier this year.  She called in in July with increasing angina. We added amlodipine 2.5 mg to her medical regimen. She has done well since that time.  Today she has no chest pain and no SOB.  She has been having swollen hands and at times they are red then turn white.  They do become painful.  Her father had something similar before he died and she stated she became scared.  Her hands have always turned red but the whiteness is new.  Her feet do this and she wear warm socks.      Past Medical History  Diagnosis Date  . Hypothyroidism   . Hyperlipidemia   . GERD (gastroesophageal reflux disease)   . Anxiety   . Diabetes mellitus   . Hypertension   . PVD (peripheral vascular disease) ([place])   . Carotid bruit   . Other dysphagia   . Other esophagitis   .  Hemorrhoid   . Other constipation   . Colon, diverticulosis   . Hiatal hernia   . Osteoarthrosis, unspecified whether generalized or localized, unspecified site   . Degenerative joint disease   . Esophageal stricture   . Unspecified hearing loss   . Diastolic dysfunction   . CAD (coronary artery disease)   . Shingles   . Nonspecific abnormal electrocardiogram (ECG) (EKG)   . Nonspecific abnormal electrocardiogram (ECG) (EKG)   . Colon polyp     adenomatous  . Allergy     seasonal  . Anemia   . Myocardial infarction ([place])     20 years ago    Past Surgical History  Procedure Laterality Date  . Appendectomy    . Cholecystectomy    . Abdominal hysterectomy    . Oophorectomy      bilateral  . Coronary artery bypass graft      x4(1982),02-2002 CABG X2  . Back surgery  01-2000  . Pacemaker insertion  07/06/12    MDT Adapta L implanted by Dr Rayann Heman for symptomatic bradycardia  . Permanent pacemaker insertion N/A 07/06/2012    Procedure: PERMANENT PACEMAKER INSERTION;  Surgeon: Thompson Grayer, MD;  Location: [facility] CATH LAB;  Service: Cardiovascular;  Laterality: N/A;  . Left heart catheterization with coronary/graft angiogram N/A 03/05/2014    Procedure: LEFT HEART CATHETERIZATION WITH  Beatrix Fetters;  Surgeon: Sinclair Grooms, MD;  Location: [facility] CATH LAB;  Service: Cardiovascular;  Laterality: N/A;  . Colonoscopy       Current Outpatient Prescriptions  Medication Sig Dispense Refill  . amLODipine (NORVASC) 2.5 MG tablet Take 1 tablet (2.5 mg total) by mouth daily. 30 tablet 6  . aspirin 325 MG EC tablet Take 325 mg by mouth daily.      Marland Kitchen atorvastatin (LIPITOR) 80 MG tablet Take 1 tablet (80 mg total) by mouth daily at 6 PM. 30 tablet 5  . diazepam (VALIUM) 2 MG tablet take 1 tablet by mouth every 6 hours if needed for anxiety 30 tablet 3  . DULoxetine (CYMBALTA) 60 MG capsule take 1 capsule by mouth once daily 30 capsule 2  . glimepiride (AMARYL) 2 MG tablet Take 2 mg by mouth  daily before breakfast.     . isosorbide mononitrate (IMDUR) 120 MG 24 hr tablet take 1 tablet by mouth once daily 30 tablet 5  . levothyroxine (SYNTHROID, LEVOTHROID) 88 MCG tablet Take 88 mcg by mouth daily.  0  . losartan (COZAAR) 50 MG tablet Take 1 tablet (50 mg total) by mouth daily. 30 tablet 5  . metFORMIN (GLUCOPHAGE) 500 MG tablet Take 1 tablet (500 mg total) by mouth daily with breakfast. 90 tablet 3  . nitroGLYCERIN (NITROSTAT) 0.4 MG SL tablet Place 1 tablet (0.4 mg total) under the tongue every 5 (five) minutes as needed. For chest pain 25 tablet 5  . pantoprazole (PROTONIX) 40 MG tablet Take 1 tablet (40 mg total) by mouth 2 (two) times daily. 30 tablet 6  . ranolazine (RANEXA) 500 MG 12 hr tablet Take 1 tablet (500 mg total) by mouth 2 (two) times daily. 60 tablet 11  . traMADol (ULTRAM) 50 MG tablet Take 50 mg by mouth every 6 (six) hours as needed for moderate pain (back pain).   0  . traZODone (DESYREL) 50 MG tablet Take 1 tablet (50 mg total) by mouth at bedtime as needed for sleep. 145 tablet 1   No current facility-administered medications for this visit.    Allergies:   Codeine; Morphine; Penicillins; Prednisone; Sulfonamide derivatives; Hydrocodone-acetaminophen; Meperidine hcl; Metoclopramide hcl; Metoprolol succinate; Moxifloxacin; Oxycodone-aspirin; Pentazocine lactate; Pioglitazone; and Tramadol hcl    Social History:  The patient  reports that she quit smoking about 39 years ago. Her smoking use included Cigarettes. She has never used smokeless tobacco. She reports that she drinks alcohol. She reports that she does not use illicit drugs.   Family History:  The patient's family history includes Heart attack in her sister; Heart disease in her sister. There is no history of Colon cancer, Breast cancer, Celiac disease, Cirrhosis, Clotting disorder, Colitis, Colon polyps, Crohn's disease, Cystic fibrosis, Diabetes, Esophageal cancer, Hemochromatosis, Inflammatory bowel  disease, Irritable bowel syndrome, Kidney disease, Liver cancer, Liver disease, Ovarian cancer, Pancreatic cancer, Prostate cancer, Rectal cancer, Stomach cancer, Ulcerative colitis, Uterine cancer, or Wilson's disease.    ROS:  General:no colds or fevers, no weight changes Skin:no rashes or ulcers HEENT:no blurred vision, no congestion CV:see HPI PUL:see HPI GI:no diarrhea constipation or melena, no indigestion GU:no hematuria, no dysuria MS:no joint pain, no claudication + white fingers Neuro:no syncope, no lightheadedness Endo:+ diabetes stable, + thyroid disease  Wt Readings from Last 3 Encounters:  12/12/15 136 lb (61.689 kg)  11/18/15 137 lb (62.143 kg)  11/06/15 137 lb 8 oz (62.37 kg)     PHYSICAL EXAM: VS:  BP 136/80  mmHg  Pulse 82  Ht 5' (1.524 m)  Wt 136 lb (61.689 kg)  BMI 26.56 kg/m2 , BMI Body mass index is 26.56 kg/(m^2). General:Pleasant affect, NAD Skin:Warm and dry, brisk capillary refill HEENT:normocephalic, sclera clear, mucus membranes moist Neck:supple, no JVD, no bruits  Heart:S1S2 RRR without murmur, gallup, rub or click Lungs:clear without rales, rhonchi, or wheezes [id number], non tender, + BS, do not palpate liver spleen or masses Ext:no lower ext edema, 2+ pedal pulses, 2+ radial pulses- brisk capillary refill of fingers. Neuro:alert and oriented X 3, MAE, follows commands, + facial symmetry, has tick with head movements shakes her head back and forth.    EKG:  EKG is NOT ordered today.    Recent Labs: 05/27/2015: ALT 16; BUN 14; Creatinine, Ser 1.14; Hemoglobin 14.4; Platelets 327.0; Potassium 3.8; Sodium 135    Lipid Panel    Component Value Date/Time   CHOL 212* 05/15/2014 0905   TRIG 182.0* 05/15/2014 0905   HDL 32.80* 05/15/2014 0905   CHOLHDL 6 05/15/2014 0905   VLDL 36.4 05/15/2014 0905   LDLCALC 143* 05/15/2014 0905   LDLDIRECT 98.6 02/23/2010 1255       Other studies Reviewed: Additional studies/ records that were reviewed  today include: .previous office notes.   ASSESSMENT AND PLAN:  1. Hands red then white:  Sounds like Raynaud's she has strong radial pulse.  She stated this had been going on for a month. She is worried because her father's hands did this before he died at a young age. Will check labs, asked her to wear gloves, continue amlodipine and imdur.  Keep her follow up appts but if problems increase to call back.  1. CAD, native vessel and bypass graft disease, without angina. Fortunately her anginal symptoms have resolved with the addition of amlodipine. She also continues on isosorbide and ranexa. She's allergic to beta-blockers. Continue same Rx and see back in already scheduled appts.   2. HTN: controlled on current Rx  3. Hyperlipidemia: stable on high dose atorvastatin.    Current medicines are reviewed with the patient today.  The patient Has no concerns regarding medicines.  The following changes have been made:  See above Labs/ tests ordered today include:see above  Disposition:   FU:  see above  Lennie Muckle, NP  12/12/2015 3:38 PM    [facility] [address], [place], [place], [place] Phone: [phone]; Fax: [phone]

## 2015-12-13 LAB — TSH: TSH: 9.958 u[IU]/mL — AB (ref 0.350–4.500)

## 2015-12-22 ENCOUNTER — Other Ambulatory Visit: Payer: Self-pay | Admitting: Cardiology

## 2015-12-22 NOTE — Telephone Encounter (Signed)
Rx request sent to pharmacy.  

## 2015-12-23 ENCOUNTER — Other Ambulatory Visit (INDEPENDENT_AMBULATORY_CARE_PROVIDER_SITE_OTHER): Payer: Medicare Other | Admitting: *Deleted

## 2015-12-23 DIAGNOSIS — I10 Essential (primary) hypertension: Secondary | ICD-10-CM

## 2015-12-23 LAB — CBC WITH DIFFERENTIAL/PLATELET
BASOS PCT: 1 % (ref 0–1)
Basophils Absolute: 0.1 10*3/uL (ref 0.0–0.1)
Eosinophils Absolute: 0.2 10*3/uL (ref 0.0–0.7)
Eosinophils Relative: 3 % (ref 0–5)
HCT: 40.4 % (ref 36.0–46.0)
HEMOGLOBIN: 13.9 g/dL (ref 12.0–15.0)
Lymphocytes Relative: 34 % (ref 12–46)
Lymphs Abs: 2.2 10*3/uL (ref 0.7–4.0)
MCH: 28.7 pg (ref 26.0–34.0)
MCHC: 34.4 g/dL (ref 30.0–36.0)
MCV: 83.3 fL (ref 78.0–100.0)
MPV: 9.7 fL (ref 8.6–12.4)
Monocytes Absolute: 0.7 10*3/uL (ref 0.1–1.0)
Monocytes Relative: 10 % (ref 3–12)
NEUTROS ABS: 3.4 10*3/uL (ref 1.7–7.7)
NEUTROS PCT: 52 % (ref 43–77)
PLATELETS: 317 10*3/uL (ref 150–400)
RBC: 4.85 MIL/uL (ref 3.87–5.11)
RDW: 14.6 % (ref 11.5–15.5)
WBC: 6.5 10*3/uL (ref 4.0–10.5)

## 2015-12-24 LAB — TSH: TSH: 21.445 u[IU]/mL — AB (ref 0.350–4.500)

## 2015-12-24 LAB — COMPREHENSIVE METABOLIC PANEL
ALBUMIN: 4.2 g/dL (ref 3.6–5.1)
ALK PHOS: 106 U/L (ref 33–130)
ALT: 18 U/L (ref 6–29)
AST: 23 U/L (ref 10–35)
BILIRUBIN TOTAL: 0.8 mg/dL (ref 0.2–1.2)
BUN: 16 mg/dL (ref 7–25)
CALCIUM: 9.6 mg/dL (ref 8.6–10.4)
CO2: 24 mmol/L (ref 20–31)
Chloride: 97 mmol/L — ABNORMAL LOW (ref 98–110)
Creat: 0.97 mg/dL — ABNORMAL HIGH (ref 0.60–0.88)
Glucose, Bld: 152 mg/dL — ABNORMAL HIGH (ref 65–99)
Potassium: 5 mmol/L (ref 3.5–5.3)
Sodium: 137 mmol/L (ref 135–146)
TOTAL PROTEIN: 7 g/dL (ref 6.1–8.1)

## 2015-12-25 ENCOUNTER — Other Ambulatory Visit: Payer: Self-pay | Admitting: Internal Medicine

## 2015-12-25 DIAGNOSIS — E038 Other specified hypothyroidism: Secondary | ICD-10-CM

## 2015-12-25 NOTE — Telephone Encounter (Signed)
Call her - she had blood work done by someone else - her thyroid medication needs to be increased - I do not think anyone has done that.  If not we will send a higher dose of her med to her pof.  She will need to have repeat tsh in about 8 weeks to recheck.

## 2015-12-26 MED ORDER — LEVOTHYROXINE SODIUM 112 MCG PO TABS: 112.0000 ug | ORAL_TABLET | Freq: Every day | ORAL | Status: DC

## 2015-12-26 NOTE — Telephone Encounter (Signed)
Spoke to pt. Please send on new RX to [facility]. She will be in for an appt next week.

## 2015-12-28 ENCOUNTER — Other Ambulatory Visit: Payer: Self-pay | Admitting: Neurology

## 2015-12-29 ENCOUNTER — Ambulatory Visit
Admission: RE | Admit: 2015-12-29 | Discharge: 2015-12-29 | Disposition: A | Discharge status: Home / Self Care | Payer: Medicare Other | Source: Ambulatory Visit | Attending: Neurological Surgery | Admitting: Neurological Surgery

## 2015-12-29 ENCOUNTER — Other Ambulatory Visit: Payer: Self-pay | Admitting: Neurological Surgery

## 2015-12-29 ENCOUNTER — Other Ambulatory Visit: Payer: Self-pay

## 2015-12-29 DIAGNOSIS — M4317 Spondylolisthesis, lumbosacral region: Secondary | ICD-10-CM

## 2015-12-29 DIAGNOSIS — M545 Low back pain: Secondary | ICD-10-CM | POA: Diagnosis not present

## 2015-12-29 MED ORDER — DIAZEPAM 2 MG PO TABS: ORAL_TABLET | ORAL | Status: DC

## 2015-12-29 MED ORDER — METHYLPREDNISOLONE ACETATE 40 MG/ML INJ SUSP (RADIOLOG
120.0000 mg | Freq: Once | INTRAMUSCULAR | Status: AC
  Administered 2015-12-29: 120 mg via EPIDURAL

## 2015-12-29 MED ORDER — IOHEXOL 180 MG/ML  SOLN
1.0000 mL | Freq: Once | INTRAMUSCULAR | Status: AC | PRN
  Administered 2015-12-29: 1 mL via INTRAVENOUS

## 2015-12-29 NOTE — Discharge Instructions (Signed)

## 2015-12-30 DIAGNOSIS — T1511XA Foreign body in conjunctival sac, right eye, initial encounter: Secondary | ICD-10-CM | POA: Diagnosis not present

## 2015-12-31 ENCOUNTER — Encounter: Payer: Self-pay | Admitting: Internal Medicine

## 2015-12-31 ENCOUNTER — Ambulatory Visit (INDEPENDENT_AMBULATORY_CARE_PROVIDER_SITE_OTHER): Payer: Medicare Other | Admitting: Internal Medicine

## 2015-12-31 VITALS — BP 132/74 | HR 87 | Temp 97.9°F | Resp 16 | Wt 136.0 lb

## 2015-12-31 DIAGNOSIS — E1142 Type 2 diabetes mellitus with diabetic polyneuropathy: Secondary | ICD-10-CM

## 2015-12-31 DIAGNOSIS — I25810 Atherosclerosis of coronary artery bypass graft(s) without angina pectoris: Secondary | ICD-10-CM

## 2015-12-31 DIAGNOSIS — R202 Paresthesia of skin: Secondary | ICD-10-CM | POA: Insufficient documentation

## 2015-12-31 DIAGNOSIS — E039 Hypothyroidism, unspecified: Secondary | ICD-10-CM | POA: Diagnosis not present

## 2015-12-31 DIAGNOSIS — R200 Anesthesia of skin: Secondary | ICD-10-CM | POA: Insufficient documentation

## 2015-12-31 DIAGNOSIS — I10 Essential (primary) hypertension: Secondary | ICD-10-CM

## 2015-12-31 DIAGNOSIS — E1165 Type 2 diabetes mellitus with hyperglycemia: Secondary | ICD-10-CM

## 2015-12-31 DIAGNOSIS — K219 Gastro-esophageal reflux disease without esophagitis: Secondary | ICD-10-CM | POA: Diagnosis not present

## 2015-12-31 MED ORDER — DULOXETINE HCL 60 MG PO CPEP: 60.0000 mg | ORAL_CAPSULE | Freq: Every day | ORAL | Status: DC

## 2015-12-31 MED ORDER — GABAPENTIN 100 MG PO CAPS: 100.0000 mg | ORAL_CAPSULE | Freq: Every day | ORAL | Status: DC

## 2015-12-31 NOTE — Assessment & Plan Note (Signed)
Will try gabapentin at night only Advised it may cause some drowsiness Start at 100 mg and increase slowly if needed

## 2015-12-31 NOTE — Patient Instructions (Signed)
I ordered blood work and you should have this done at the end of March sometime.  You do need to fast for 12 hours for the blood work.   We will start a new medication, called gabapentin at night for your foot pain.  Start with one pill at night and you can increase to two pills at night after a few nights if the one pill is not effective.  If 2 pills are not effective we may need to increase further please call us and we will tell you how to increase at that point.   Follow up with me in 6 months for a routine follow up visit.     Cymbalta and gabapentin prescriptions were sent to your pharmacy.

## 2015-12-31 NOTE — Progress Notes (Signed)
Pre visit review using our clinic review tool, if applicable. No additional management support is needed unless otherwise documented below in the visit note. 

## 2015-12-31 NOTE — Assessment & Plan Note (Signed)
Asymptomatic Follows with cardio Continue current medications Check lipid panel with next blood work

## 2015-12-31 NOTE — Assessment & Plan Note (Signed)
BP controlled. Continue current meds 

## 2015-12-31 NOTE — Progress Notes (Signed)
Subjective:    Patient ID: Janet Steele, female    DOB: [birth year], 82 y.o.   MRN: [id number]  HPI She is here to establish with a new pcp.   Feet burning:  She has had intermittent burning in her feet for two years.  She thinks it is peripheral neuropathy. She uses a cream on her feet from the drug store and it helps for about 10 minutes only.  She has never taken any medication for it.    Hypothyroidism:  We just recently increased her medication dose because of an elevated tsh. She takes the medication daily. She does feel tired but related that to not sleeping well.      GERD:  She is taking her medication daily as prescribed.  She denies any GERD symptoms and feels her GERD is well controlled.   Diabetes: She is taking her medication daily as prescribed. She is is not always compliant with a diabetic diet. She is not exercising regularly. She monitors her sugars and they have been running 130s. She checks her feet daily and denies foot lesions. She is up-to-date with an ophthalmology examination.   Hypertension: She is taking her medication daily. She is compliant with a low sodium diet.  She denies chest pain, palpitations, edema and regular headaches. She sometimes has sob with stairs.  She is not exercising regularly.  She does not monitor her blood pressure at home.    She gets muscle cramping at night and with walking.     Medications and allergies reviewed with patient and updated if appropriate.  Patient Active Problem List   Diagnosis Date Noted  . MCI (mild cognitive impairment) with memory loss 11/18/2015  . Facial tic 05/07/2015  . Facial nerve spasm 05/07/2015  . Sick sinus syndrome ([place]) 02/26/2015  . Cervical dystonia 12/04/2014  . Tic 12/04/2014  . Chronic motor tic 09/12/2014  . Insomnia due to drug ([place]) 05/03/2014  . NSTEMI (non-ST elevated myocardial infarction) ([place]) 03/06/2014  . Nonspecific abnormal electrocardiogram (ECG) (EKG)   . Chest pain  03/02/2014  . Pacemaker-[employer]tronic 07/07/2012  . Bradycardia 07/05/2012  . GERD (gastroesophageal reflux disease) 07/05/2012  . Near syncope 12/23/2011  . COUGH 08/04/2010  . Abnormal involuntary movements(781.0) 12/17/2009  . SLEEP DISORDER 12/11/2009  . CAD, ARTERY BYPASS GRAFT 09/24/2009  . CAROTID BRUIT 09/24/2009  . Diabetes ([place]) 01/30/2009  . DYSPHAGIA 06/19/2008  . ANXIETY 02/14/2008  . EROSIVE ESOPHAGITIS 02/14/2008  . CONSTIPATION, CHRONIC 02/14/2008  . DEGENERATIVE JOINT DISEASE 02/14/2008  . HEARING LOSS 01/04/2008  . PVD 01/04/2008  . HYPOTHYROIDISM 09/25/2007  . HYPERLIPIDEMIA 09/25/2007  . Essential hypertension 09/07/2007  . HYPERTRIGLYCERIDEMIA 03/16/2007  . POLYNEUROPATHY, DIABETIC 03/16/2007  . Stricture and stenosis of esophagus 03/16/2007  . HIATAL HERNIA 02/16/2007  . HEMORRHOIDS 12/31/1997  . DIVERTICULOSIS, COLON 12/31/1997    Current Outpatient Prescriptions on File Prior to Visit  Medication Sig Dispense Refill  . amLODipine (NORVASC) 2.5 MG tablet Take 1 tablet (2.5 mg total) by mouth daily. 30 tablet 6  . aspirin 325 MG EC tablet Take 325 mg by mouth daily.      Marland Kitchen atorvastatin (LIPITOR) 80 MG tablet take 1 tablet by mouth once daily AT 6 PM 30 tablet 3  . diazepam (VALIUM) 2 MG tablet take 1 tablet by mouth every 6 hours if needed for anxiety 30 tablet 3  . DULoxetine (CYMBALTA) 60 MG capsule take 1 capsule by mouth once daily 30 capsule 2  . glimepiride (  AMARYL) 2 MG tablet Take 2 mg by mouth daily before breakfast.     . isosorbide mononitrate (IMDUR) 120 MG 24 hr tablet take 1 tablet by mouth once daily 30 tablet 5  . levothyroxine (SYNTHROID, LEVOTHROID) 112 MCG tablet Take 1 tablet (112 mcg total) by mouth daily. 90 tablet 0  . losartan (COZAAR) 50 MG tablet Take 1 tablet (50 mg total) by mouth daily. 30 tablet 5  . metFORMIN (GLUCOPHAGE) 500 MG tablet Take 1 tablet (500 mg total) by mouth daily with breakfast. 90 tablet 3  . nitroGLYCERIN  (NITROSTAT) 0.4 MG SL tablet Place 1 tablet (0.4 mg total) under the tongue every 5 (five) minutes as needed. For chest pain 25 tablet 5  . pantoprazole (PROTONIX) 40 MG tablet Take 1 tablet (40 mg total) by mouth 2 (two) times daily. 30 tablet 6  . ranolazine (RANEXA) 500 MG 12 hr tablet Take 1 tablet (500 mg total) by mouth 2 (two) times daily. 60 tablet 11  . traMADol (ULTRAM) 50 MG tablet Take 50 mg by mouth every 6 (six) hours as needed for moderate pain (back pain).   0  . traZODone (DESYREL) 50 MG tablet Take 1 tablet (50 mg total) by mouth at bedtime as needed for sleep. 145 tablet 1   No current facility-administered medications on file prior to visit.    Past Medical History  Diagnosis Date  . Hypothyroidism   . Hyperlipidemia   . GERD (gastroesophageal reflux disease)   . Anxiety   . Diabetes mellitus   . Hypertension   . PVD (peripheral vascular disease) ([place])   . Carotid bruit   . Other dysphagia   . Other esophagitis   . Hemorrhoid   . Other constipation   . Colon, diverticulosis   . Hiatal hernia   . Osteoarthrosis, unspecified whether generalized or localized, unspecified site   . Degenerative joint disease   . Esophageal stricture   . Unspecified hearing loss   . Diastolic dysfunction   . CAD (coronary artery disease)   . Shingles   . Nonspecific abnormal electrocardiogram (ECG) (EKG)   . Nonspecific abnormal electrocardiogram (ECG) (EKG)   . Colon polyp     adenomatous  . Allergy     seasonal  . Anemia   . Myocardial infarction ([place])     20 years ago    Past Surgical History  Procedure Laterality Date  . Appendectomy    . Cholecystectomy    . Abdominal hysterectomy    . Oophorectomy      bilateral  . Coronary artery bypass graft      x4(1982),02-2002 CABG X2  . Back surgery  01-2000  . Pacemaker insertion  07/06/12    MDT Adapta L implanted by Dr Rayann Heman for symptomatic bradycardia  . Permanent pacemaker insertion N/A 07/06/2012    Procedure:  PERMANENT PACEMAKER INSERTION;  Surgeon: Thompson Grayer, MD;  Location: [facility] CATH LAB;  Service: Cardiovascular;  Laterality: N/A;  . Left heart catheterization with coronary/graft angiogram N/A 03/05/2014    Procedure: LEFT HEART CATHETERIZATION WITH Beatrix Fetters;  Surgeon: Sinclair Grooms, MD;  Location: [facility] CATH LAB;  Service: Cardiovascular;  Laterality: N/A;  . Colonoscopy      Social History   Social History  . Marital Status: Married    Spouse Name: [name]  . Number of Children: 3  . Years of Education: 16   Occupational History  . Retired    .     Social  History Main Topics  . Smoking status: Former Smoker    Types: Cigarettes    Quit date: 07/05/1976  . Smokeless tobacco: Never Used     Comment: smoked age18- 1977, up to 1/2 ppd  . Alcohol Use: 0.0 oz/week    0 Standard drinks or equivalent per week     Comment: Glass a wine a week  . Drug Use: No  . Sexual Activity: No   Other Topics Concern  . Not on file   Social History Narrative   Patient is married [name]) and lives at home with her husband.   Patient has three adult children.   Patient is retired.   Patient has a college education.   Patient is right-handed.   Patient does not drink any caffeine.    Family History  Problem Relation Age of Onset  . Heart disease Sister   . Heart attack Sister   . Colon cancer Neg Hx   . Breast cancer Neg Hx   . Celiac disease Neg Hx   . Cirrhosis Neg Hx   . Clotting disorder Neg Hx   . Colitis Neg Hx   . Colon polyps Neg Hx   . Crohn's disease Neg Hx   . Cystic fibrosis Neg Hx   . Diabetes Neg Hx   . Esophageal cancer Neg Hx   . Hemochromatosis Neg Hx   . Inflammatory bowel disease Neg Hx   . Irritable bowel syndrome Neg Hx   . Kidney disease Neg Hx   . Liver cancer Neg Hx   . Liver disease Neg Hx   . Ovarian cancer Neg Hx   . Pancreatic cancer Neg Hx   . Prostate cancer Neg Hx   . Rectal cancer Neg Hx   . Stomach cancer Neg Hx   .  Ulcerative colitis Neg Hx   . Uterine cancer Neg Hx   . Wilson's disease Neg Hx     Review of Systems  Constitutional: Positive for fatigue. Negative for fever and chills.  Respiratory: Positive for cough and shortness of breath (sometimes with exertion). Negative for wheezing.   Cardiovascular: Negative for chest pain, palpitations and leg swelling.  Neurological: Positive for dizziness (with changes changes in positions ) and numbness. Negative for light-headedness and headaches.  Psychiatric/Behavioral: Positive for sleep disturbance (does not sleep well).       Objective:   Filed Vitals:   12/31/15 1308  BP: 132/74  Pulse: 87  Temp: 97.9 F (36.6 C)  Resp: 16   Filed Weights   12/31/15 1308  Weight: 136 lb (61.689 kg)   Body mass index is 26.56 kg/(m^2).   Physical Exam Constitutional: Appears well-developed and well-nourished. No distress.  Neck: Neck supple. No tracheal deviation present. No thyromegaly present.  No carotid bruit. No cervical adenopathy.   Cardiovascular: Normal rate, regular rhythm and normal heart sounds.   No murmur heard.  No edema Pulmonary/Chest: Effort normal and breath sounds normal. No respiratory distress. No wheezes.       Assessment & Plan:   See Problem List for Assessment and Plan of chronic medical problems.  Blood work in 2 months  F/u visit in 6 months

## 2015-12-31 NOTE — Assessment & Plan Note (Signed)
S/p esophageal dilation x 3 Controlled with daily medication Continue protonix twice daily

## 2015-12-31 NOTE — Assessment & Plan Note (Signed)
Check b12 level  

## 2015-12-31 NOTE — Assessment & Plan Note (Signed)
Medication dose just increased last week Check tsh in about two months

## 2015-12-31 NOTE — Assessment & Plan Note (Signed)
check a1c with next blood work in 2 months Continue current meds Eye exam up to date Ideally increase exercise

## 2016-01-12 DIAGNOSIS — E119 Type 2 diabetes mellitus without complications: Secondary | ICD-10-CM | POA: Diagnosis not present

## 2016-01-12 DIAGNOSIS — Z0100 Encounter for examination of eyes and vision without abnormal findings: Secondary | ICD-10-CM | POA: Diagnosis not present

## 2016-01-12 DIAGNOSIS — H35372 Puckering of macula, left eye: Secondary | ICD-10-CM | POA: Diagnosis not present

## 2016-01-12 DIAGNOSIS — Z961 Presence of intraocular lens: Secondary | ICD-10-CM | POA: Diagnosis not present

## 2016-01-15 ENCOUNTER — Telehealth: Payer: Self-pay

## 2016-01-15 NOTE — Telephone Encounter (Signed)
Received notice from [facility] that pt's diazepam 2mg  tabelt is available to pt and it does not need a pa. Pt should be able to pick up her medicine at any pharmacy that accepts [facility].  I attempted to do a pa and quantity limit for this pt but Mcarthur Rossetti said this was not needed.  Will fax notification to [facility].

## 2016-02-02 ENCOUNTER — Encounter: Payer: Self-pay | Admitting: Nurse Practitioner

## 2016-02-02 NOTE — Progress Notes (Signed)
Electrophysiology Office Note Date: 02/03/2016  ID:  Janet Steele, DOB [birth year], MRN [id number]  PCP: Janet Rail, MD Primary Cardiologist: Burt Knack Electrophysiologist: Allred  CC: Pacemaker follow-up  [name] is a 82 y.o. female seen today for Dr Rayann Heman.  She presents today for routine electrophysiology followup.  Since last being seen in our clinic, the patient reports doing very well.  She denies chest pain, palpitations, dyspnea, PND, orthopnea, nausea, vomiting, dizziness, syncope, edema, weight gain, or early satiety.  Device History: MDT dual chamber PPM implanted 2013 for symptomatic bradycardia   Past Medical History  Diagnosis Date  . Hypothyroidism   . Hyperlipidemia   . GERD (gastroesophageal reflux disease)   . Anxiety   . Diabetes mellitus   . Hypertension   . PVD (peripheral vascular disease) ([place])   . Carotid bruit   . Colon, diverticulosis   . Hiatal hernia   . Osteoarthrosis, unspecified whether generalized or localized, unspecified site   . Degenerative joint disease   . Esophageal stricture   . Unspecified hearing loss   . Diastolic dysfunction   . CAD (coronary artery disease)     a. s/p CABG 1982 b. redo CABG 2003  . Colon polyp     adenomatous  . Anemia    Past Surgical History  Procedure Laterality Date  . Appendectomy    . Cholecystectomy    . Abdominal hysterectomy    . Oophorectomy      bilateral  . Coronary artery bypass graft      x4(1982),02-2002 CABG X2  . Back surgery  01-2000  . Permanent pacemaker insertion N/A 07/06/2012    MDT Adapta L implanted by Dr Rayann Heman for symptomatic bradycardia  . Left heart catheterization with coronary/graft angiogram N/A 03/05/2014    Procedure: LEFT HEART CATHETERIZATION WITH Beatrix Fetters;  Surgeon: Sinclair Grooms, MD;  Location: [facility] CATH LAB;  Service: Cardiovascular;  Laterality: N/A;  . Colonoscopy      Current Outpatient Prescriptions  Medication Sig Dispense Refill    . amLODipine (NORVASC) 2.5 MG tablet Take 1 tablet (2.5 mg total) by mouth daily. 30 tablet 6  . aspirin 325 MG EC tablet Take 325 mg by mouth daily.      Marland Kitchen atorvastatin (LIPITOR) 80 MG tablet take 1 tablet by mouth once daily AT 6 PM 30 tablet 3  . diazepam (VALIUM) 2 MG tablet take 1 tablet by mouth every 6 hours if needed for anxiety 30 tablet 3  . DULoxetine (CYMBALTA) 60 MG capsule Take 1 capsule (60 mg total) by mouth daily. 90 capsule 1  . gabapentin (NEURONTIN) 100 MG capsule Take 1 capsule (100 mg total) by mouth at bedtime. Can increase to 2 pills at bedtime after a few days . 180 capsule 3  . glimepiride (AMARYL) 2 MG tablet Take 2 mg by mouth daily before breakfast.     . isosorbide mononitrate (IMDUR) 120 MG 24 hr tablet take 1 tablet by mouth once daily 30 tablet 5  . levothyroxine (SYNTHROID, LEVOTHROID) 112 MCG tablet Take 1 tablet (112 mcg total) by mouth daily. 90 tablet 0  . losartan (COZAAR) 50 MG tablet Take 1 tablet (50 mg total) by mouth daily. 30 tablet 5  . metFORMIN (GLUCOPHAGE) 500 MG tablet Take 1 tablet (500 mg total) by mouth daily with breakfast. 90 tablet 3  . nitroGLYCERIN (NITROSTAT) 0.4 MG SL tablet Place 1 tablet (0.4 mg total) under the tongue every 5 (five) minutes  as needed. For chest pain 25 tablet 5  . pantoprazole (PROTONIX) 40 MG tablet Take 1 tablet (40 mg total) by mouth 2 (two) times daily. 30 tablet 6  . ranolazine (RANEXA) 500 MG 12 hr tablet Take 1 tablet (500 mg total) by mouth 2 (two) times daily. 60 tablet 11  . traMADol (ULTRAM) 50 MG tablet Take 50 mg by mouth every 6 (six) hours as needed for moderate pain (back pain).   0  . traZODone (DESYREL) 50 MG tablet Take 1 tablet (50 mg total) by mouth at bedtime as needed for sleep. 145 tablet 1   No current facility-administered medications for this visit.    Allergies:   Codeine; Morphine; Penicillins; Prednisone; Sulfonamide derivatives; Hydrocodone-acetaminophen; Meperidine hcl; Metoclopramide  hcl; Metoprolol succinate; Moxifloxacin; Oxycodone-aspirin; Pentazocine lactate; Pioglitazone; and Tramadol hcl   Social History: Social History   Social History  . Marital Status: Married    Spouse Name: [name]  . Number of Children: 3  . Years of Education: 16   Occupational History  . Retired    .     Social History Main Topics  . Smoking status: Former Smoker    Types: Cigarettes    Quit date: 07/05/1976  . Smokeless tobacco: Never Used     Comment: smoked age18- 1977, up to 1/2 ppd  . Alcohol Use: 0.0 oz/week    0 Standard drinks or equivalent per week     Comment: Glass a wine a week  . Drug Use: No  . Sexual Activity: No   Other Topics Concern  . Not on file   Social History Narrative   Patient is married [name]) and lives at home with her husband.   Patient has three adult children.   Patient is retired.   Patient has a college education.   Patient is right-handed.   Patient does not drink any caffeine.    Family History: Family History  Problem Relation Age of Onset  . Heart disease Sister   . Heart attack Sister   . Colon cancer Neg Hx   . Breast cancer Neg Hx   . Celiac disease Neg Hx   . Cirrhosis Neg Hx   . Clotting disorder Neg Hx   . Colitis Neg Hx   . Colon polyps Neg Hx   . Crohn's disease Neg Hx   . Cystic fibrosis Neg Hx   . Diabetes Neg Hx   . Esophageal cancer Neg Hx   . Hemochromatosis Neg Hx   . Inflammatory bowel disease Neg Hx   . Irritable bowel syndrome Neg Hx   . Kidney disease Neg Hx   . Liver cancer Neg Hx   . Liver disease Neg Hx   . Ovarian cancer Neg Hx   . Pancreatic cancer Neg Hx   . Prostate cancer Neg Hx   . Rectal cancer Neg Hx   . Stomach cancer Neg Hx   . Ulcerative colitis Neg Hx   . Uterine cancer Neg Hx   . Wilson's disease Neg Hx      Review of Systems: All other systems reviewed and are otherwise negative except as noted above.   Physical Exam: VS:  BP 90/50 mmHg  Pulse 83  Ht 5' (1.524 m)   Wt 130 lb 3.2 oz (59.058 kg)  BMI 25.43 kg/m2 , BMI Body mass index is 25.43 kg/(m^2).  GEN- The patient is elderly appearing, alert and oriented x 3 today.   HEENT: normocephalic, atraumatic; sclera clear, conjunctiva  pink; hearing intact; oropharynx clear; neck supple  Lungs- Clear to ausculation bilaterally, normal work of breathing.  No wheezes, rales, rhonchi Heart- Regular rate and rhythm, 1/6 SEM GI- soft, non-tender, non-distended, bowel sounds present  Extremities- no clubbing, cyanosis, or edema; DP/PT/radial pulses 2+ bilaterally MS- no significant deformity or atrophy Skin- warm and dry, no rash or lesion; PPM pocket well healed Psych- euthymic mood, full affect Neuro- strength and sensation are intact  PPM Interrogation- reviewed in detail today,  See PACEART report  EKG:  EKG is not ordered today.  Recent Labs: 12/23/2015: ALT 18; BUN 16; Creat 0.97*; Hemoglobin 13.9; Platelets 317; Potassium 5.0; Sodium 137; TSH 21.445*   Wt Readings from Last 3 Encounters:  02/03/16 130 lb 3.2 oz (59.058 kg)  12/31/15 136 lb (61.689 kg)  12/12/15 136 lb (61.689 kg)     Other studies Reviewed: Additional studies/ records that were reviewed today include: Dr Burt Knack and Dr Jackalyn Lombard office notes  Assessment and Plan:  1.  Symptomatic bradycardia Normal PPM function See Pace Art report No changes today  2.  CAD s/p CABG No recent ischemic symptoms Continue medical therapy No BB 2/2 intolerance   Current medicines are reviewed at length with the patient today.   The patient does not have concerns regarding her medicines.  The following changes were made today:  none  Labs/ tests ordered today include: none   Disposition:   Follow up with [facility] transmissions, me in 1 year, Dr Burt Knack as scheduled     Signed, Chanetta Marshall, NP 02/03/2016 2:08 PM  [place] [address] [phone] (office) [phone] (fax)

## 2016-02-03 ENCOUNTER — Encounter: Payer: Self-pay | Admitting: Nurse Practitioner

## 2016-02-03 ENCOUNTER — Encounter: Payer: Self-pay | Admitting: *Deleted

## 2016-02-03 ENCOUNTER — Encounter: Payer: Self-pay | Admitting: Internal Medicine

## 2016-02-03 ENCOUNTER — Ambulatory Visit (INDEPENDENT_AMBULATORY_CARE_PROVIDER_SITE_OTHER): Payer: Medicare Other | Admitting: Nurse Practitioner

## 2016-02-03 VITALS — BP 90/50 | HR 83 | Ht 60.0 in | Wt 130.2 lb

## 2016-02-03 DIAGNOSIS — I495 Sick sinus syndrome: Secondary | ICD-10-CM

## 2016-02-03 DIAGNOSIS — I25810 Atherosclerosis of coronary artery bypass graft(s) without angina pectoris: Secondary | ICD-10-CM

## 2016-02-03 NOTE — Patient Instructions (Signed)
Medication Instructions:   Your physician recommends that you continue on your current medications as directed. Please refer to the Current Medication list given to you today.   If you need a refill on your cardiac medications before your next appointment, please call your pharmacy.  Labwork:  NONE ORDER TODAY    Testing/Procedures: NONE ORDER TODAY    Follow-Up: Remote monitoring is used to monitor your Pacemaker of ICD from home. This monitoring reduces the number of office visits required to check your device to one time per year. It allows [place] to keep an eye on the functioning of your device to ensure it is working properly. You are scheduled for a device check from home on . 05/03/16..You may send your transmission at any time that day. If you have a wireless device, the transmission will be sent automatically. After your physician reviews your transmission, you will receive a postcard with your next transmission date.  Your physician wants you to follow-up in: [place] will receive a reminder letter in the mail two months in advance. If you don't receive a letter, please call our office to schedule the follow-up appointment.    Any Other Special Instructions Will Be Listed Below (If Applicable).

## 2016-02-05 LAB — CUP PACEART INCLINIC DEVICE CHECK
Brady Statistic AP VS Percent: 91 %
Brady Statistic AS VP Percent: 0 %
Date Time Interrogation Session: 20170228200954
Implantable Lead Implant Date: 20130801
Implantable Lead Location: 753859
Implantable Lead Location: 753860
Lead Channel Impedance Value: 506 Ohm
Lead Channel Pacing Threshold Amplitude: 0.75 V
Lead Channel Pacing Threshold Amplitude: 1 V
Lead Channel Pacing Threshold Pulse Width: 0.4 ms
Lead Channel Pacing Threshold Pulse Width: 0.4 ms
Lead Channel Sensing Intrinsic Amplitude: 22.4 mV
Lead Channel Sensing Intrinsic Amplitude: 4 mV
Lead Channel Setting Pacing Amplitude: 2 V
Lead Channel Setting Pacing Pulse Width: 0.4 ms
MDC IDC LEAD IMPLANT DT: 20130801
MDC IDC MSMT BATTERY IMPEDANCE: 180 Ohm
MDC IDC MSMT BATTERY REMAINING LONGEVITY: 124 mo
MDC IDC MSMT BATTERY VOLTAGE: 2.79 V
MDC IDC MSMT LEADCHNL RA PACING THRESHOLD AMPLITUDE: 0.75 V
MDC IDC MSMT LEADCHNL RA PACING THRESHOLD AMPLITUDE: 1 V
MDC IDC MSMT LEADCHNL RA PACING THRESHOLD PULSEWIDTH: 0.4 ms
MDC IDC MSMT LEADCHNL RA PACING THRESHOLD PULSEWIDTH: 0.4 ms
MDC IDC MSMT LEADCHNL RV IMPEDANCE VALUE: 560 Ohm
MDC IDC SET LEADCHNL RV PACING AMPLITUDE: 2.5 V
MDC IDC SET LEADCHNL RV SENSING SENSITIVITY: 5.6 mV
MDC IDC STAT BRADY AP VP PERCENT: 0 %
MDC IDC STAT BRADY AS VS PERCENT: 9 %

## 2016-02-06 ENCOUNTER — Encounter: Payer: Medicare Other | Admitting: Nurse Practitioner

## 2016-02-20 ENCOUNTER — Telehealth: Payer: Self-pay | Admitting: Cardiovascular Disease

## 2016-02-20 NOTE — Telephone Encounter (Signed)
ERROR

## 2016-02-22 NOTE — Progress Notes (Signed)
Cardiology Office Note:    Date:  02/23/2016   ID:  [name], DOB 23-Sep-1929, MRN [id number]  PCP:  Binnie Rail, MD  Cardiologist:  Dr. Sherren Mocha   Electrophysiologist:  Dr. Thompson Grayer   Chief Complaint  Patient presents with  . Chest Pain    History of Present Illness:     Janet Steele is a 82 y.o. female with a hx of CAD status post redo CABG (initial surgery 1982, most recent 2003), HL, diabetes, GERD, HTN, symptomatic bradycardia status post pacemaker. She suffered a non-STEMI 4/16. LHC demonstrated occlusion of her native coronary arteries and continued patency of the LIMA-LAD and SVG-PDA. There was diffuse stenosis between the PDA and to LV branches of the distal RCA. There were collaterals supplying the LCx distribution. There were no targets amenable to revascularization. She was managed medically. She is intolerant to beta blockers. Last seen by Dr. Burt Knack 8/16.   She returns today with complaints of recurring angina. This has been occurring over the past 2 months. She notes increasing frequency. She has symptoms with exertion. She can sometimes exert herself without symptoms. She's also had some symptoms at rest. She's had some symptoms awaken her from sleep. Her symptoms are substernal and described as heavy. Nitroglycerin typically helps after 3-5 minutes of pain. She denies dyspnea, nausea, diaphoresis. Denies syncope, orthopnea, PND or edema.  Past Medical History  Diagnosis Date  . Hypothyroidism   . Hyperlipidemia   . GERD (gastroesophageal reflux disease)   . Anxiety   . Diabetes mellitus   . Hypertension   . PVD (peripheral vascular disease) ([place])   . Carotid bruit   . Colon, diverticulosis   . Hiatal hernia   . Osteoarthrosis, unspecified whether generalized or localized, unspecified site   . Degenerative joint disease   . Esophageal stricture   . Unspecified hearing loss   . Diastolic dysfunction   . CAD (coronary artery disease)     a. s/p  CABG 1982 b. redo CABG 2003  . Colon polyp     adenomatous  . Anemia     Past Surgical History  Procedure Laterality Date  . Appendectomy    . Cholecystectomy    . Abdominal hysterectomy    . Oophorectomy      bilateral  . Coronary artery bypass graft      x4(1982),02-2002 CABG X2  . Back surgery  01-2000  . Permanent pacemaker insertion N/A 07/06/2012    MDT Adapta L implanted by Dr Rayann Heman for symptomatic bradycardia  . Left heart catheterization with coronary/graft angiogram N/A 03/05/2014    Procedure: LEFT HEART CATHETERIZATION WITH Beatrix Fetters;  Surgeon: Sinclair Grooms, MD;  Location: [facility] CATH LAB;  Service: Cardiovascular;  Laterality: N/A;  . Colonoscopy      Current Medications: Outpatient Prescriptions Prior to Visit  Medication Sig Dispense Refill  . aspirin 325 MG EC tablet Take 325 mg by mouth daily.      Marland Kitchen atorvastatin (LIPITOR) 80 MG tablet take 1 tablet by mouth once daily AT 6 PM 30 tablet 3  . diazepam (VALIUM) 2 MG tablet take 1 tablet by mouth every 6 hours if needed for anxiety 30 tablet 3  . DULoxetine (CYMBALTA) 60 MG capsule Take 1 capsule (60 mg total) by mouth daily. 90 capsule 1  . glimepiride (AMARYL) 2 MG tablet Take 2 mg by mouth daily before breakfast.     . isosorbide mononitrate (IMDUR) 120 MG 24 hr tablet  take 1 tablet by mouth once daily 30 tablet 5  . levothyroxine (SYNTHROID, LEVOTHROID) 112 MCG tablet Take 1 tablet (112 mcg total) by mouth daily. 90 tablet 0  . losartan (COZAAR) 50 MG tablet Take 1 tablet (50 mg total) by mouth daily. 30 tablet 5  . metFORMIN (GLUCOPHAGE) 500 MG tablet Take 1 tablet (500 mg total) by mouth daily with breakfast. 90 tablet 3  . nitroGLYCERIN (NITROSTAT) 0.4 MG SL tablet Place 1 tablet (0.4 mg total) under the tongue every 5 (five) minutes as needed. For chest pain 25 tablet 5  . pantoprazole (PROTONIX) 40 MG tablet Take 1 tablet (40 mg total) by mouth 2 (two) times daily. 30 tablet 6  . ranolazine  (RANEXA) 500 MG 12 hr tablet Take 1 tablet (500 mg total) by mouth 2 (two) times daily. 60 tablet 11  . traMADol (ULTRAM) 50 MG tablet Take 50 mg by mouth every 6 (six) hours as needed for moderate pain (back pain).   0  . traZODone (DESYREL) 50 MG tablet Take 1 tablet (50 mg total) by mouth at bedtime as needed for sleep. 145 tablet 1  . amLODipine (NORVASC) 2.5 MG tablet Take 1 tablet (2.5 mg total) by mouth daily. 30 tablet 6  . gabapentin (NEURONTIN) 100 MG capsule Take 1 capsule (100 mg total) by mouth at bedtime. Can increase to 2 pills at bedtime after a few days . 180 capsule 3   No facility-administered medications prior to visit.     Allergies:   Codeine; Morphine; Penicillins; Prednisone; Sulfonamide derivatives; Hydrocodone-acetaminophen; Meperidine hcl; Metoclopramide hcl; Metoprolol succinate; Moxifloxacin; Oxycodone-aspirin; Pentazocine lactate; Pioglitazone; and Tramadol hcl   Social History   Social History  . Marital Status: Married    Spouse Name: [name]  . Number of Children: 3  . Years of Education: 16   Occupational History  . Retired    .     Social History Main Topics  . Smoking status: Former Smoker    Types: Cigarettes    Quit date: 07/05/1976  . Smokeless tobacco: Never Used     Comment: smoked age18- 1977, up to 1/2 ppd  . Alcohol Use: 0.0 oz/week    0 Standard drinks or equivalent per week     Comment: Glass a wine a week  . Drug Use: No  . Sexual Activity: No   Other Topics Concern  . None   Social History Narrative   Patient is married [name]) and lives at home with her husband.   Patient has three adult children.   Patient is retired.   Patient has a college education.   Patient is right-handed.   Patient does not drink any caffeine.     Family History:  The patient's family history includes Heart attack in her sister; Heart disease in her sister. There is no history of Colon cancer, Breast cancer, Celiac disease, Cirrhosis, Clotting  disorder, Colitis, Colon polyps, Crohn's disease, Cystic fibrosis, Diabetes, Esophageal cancer, Hemochromatosis, Inflammatory bowel disease, Irritable bowel syndrome, Kidney disease, Liver cancer, Liver disease, Ovarian cancer, Pancreatic cancer, Prostate cancer, Rectal cancer, Stomach cancer, Ulcerative colitis, Uterine cancer, or Wilson's disease.   ROS:   Please see the history of present illness.    Review of Systems  HENT: Positive for hearing loss.   Cardiovascular: Positive for chest pain.  Hematologic/Lymphatic: Bruises/bleeds easily.  Musculoskeletal: Positive for back pain.  All other systems reviewed and are negative.   Physical Exam:    VS:  BP 140/80 mmHg  Pulse 80  Ht 5' (1.524 m)  Wt 131 lb 12.8 oz (59.784 kg)  BMI 25.74 kg/m2   GEN: Well nourished, well developed, in no acute distress HEENT: normal Neck: no JVD, no masses Cardiac: Normal S1/S2, RRR; no murmurs,  no edema   Respiratory:  clear to auscultation bilaterally; no wheezing, rhonchi or rales GI: soft, nontender, nondistended MS: no deformity or atrophy Skin: warm and dry Neuro: No focal deficits  Psych: Alert and oriented x 3, normal affect  Wt Readings from Last 3 Encounters:  02/23/16 131 lb 12.8 oz (59.784 kg)  02/03/16 130 lb 3.2 oz (59.058 kg)  12/31/15 136 lb (61.689 kg)      Studies/Labs Reviewed:     EKG:  EKG is  ordered today.  The ekg ordered today demonstrates A paced, inf-lat TWI, QTc 421 ms, no change since last tracing  Recent Labs: 12/23/2015: ALT 18; BUN 16; Creat 0.97*; Hemoglobin 13.9; Platelets 317; Potassium 5.0; Sodium 137; TSH 21.445*   Recent Lipid Panel    Component Value Date/Time   CHOL 212* 05/15/2014 0905   TRIG 182.0* 05/15/2014 0905   HDL 32.80* 05/15/2014 0905   CHOLHDL 6 05/15/2014 0905   VLDL 36.4 05/15/2014 0905   LDLCALC 143* 05/15/2014 0905   LDLDIRECT 98.6 02/23/2010 1255    Additional studies/ records that were reviewed today include:   LHC  3/15 LM patent. LAD 100% mid vessel, 99% proximal  LCx totally occluded   RCA totally occluded.Marland Kitchen SVG-diagonal/LAD is totally occluded. SVG-circumflex is totally occluded. SVG-PDA is widely patent. There is diffuse disease in the connection between the PDA and 2 left ventricular branches. There is also moderate diffuse disease in the PDA beyond the graft insertion site. No high-grade focal stenoses are noted. Progression of the disease in the segment between the PDA and left ventricular branches is noted. L-LAD is widely patent with a large diagonal also filling. The LAD supplies collaterals to the circumflex. EF 35-40% no significant mitral regurgitation is identified. IMPRESSIONS: 1. Total occlusion of LAD, circumflex, and RCA. The LAD occlusion is mid vessel and there is a new 99% proximal stenosis. The vascular territory supplied between the 2 stenoses in the LAD is relatively small. This may be a source of some of the patient's angina. 2. Total occlusion of the saphenous vein graft to the circumflex; total occlusion of the saphenous vein graft to the diagonal/LAD; and widely patent saphenous vein graft to the PDA. 3. The saphenous vein graft to the PDA as mentioned above is widely patent and there is progression of disease in the continuation of the right coronary proximal to the origin of 2 left ventricular branches (the continuation LV branches fill retrograde off the PDA proximal to the graft insertion site). 4. Patent LIMA to the LAD RECOMMENDATION: Continue aggressive medical therapy.  Echo 3/15 EF 50-55%, septal HK, Gr 1 DD, mild LAE  Carotid US 2/16 bilat ICA 1-39%   ASSESSMENT:     1. Coronary artery disease involving native coronary artery of native heart with angina pectoris ([place])   2. Essential hypertension   3. Mixed hyperlipidemia     PLAN:     In order of problems listed above:  1. CAD - s/p CABG 1982 and redo in 2003. Last LHC in 4/16 after NSTEMI with occlusion  of native vessels, occlusion of the S-LCx and S-LAD/Dx.  S-PDA and L-LAD were patent.  There was progression of disease in the continuation of the RCA proximal to the  origin of the 2 LV branches. There were no targets amenable to PCI and medical therapy was pursued. She now presents with worsening anginal symptoms. She has been intolerant to beta blockers in the past. She has been intolerant to higher doses of ranolazine. She is on maximum nitrate therapy. I reviewed her case today with Dr. Burt Knack by phone. He will review her films to see if there are any potential targets for PCI. I will adjust her medical therapy further and bring her back in close follow-up.  -  Increase amlodipine to 5 mg daily  -  Follow-up with Dr. Burt Knack later this month as planned  -  As noted, Dr. Burt Knack will review her most recent cardiac catheter is a films  2. HTN - Controlled.  3. HL - Continue statin.   Medication Adjustments/Labs and Tests Ordered: Current medicines are reviewed at length with the patient today.  Concerns regarding medicines are outlined above.  Medication changes, Labs and Tests ordered today are outlined in the Patient Instructions noted below. Patient Instructions  Medication Instructions:  1. INCREASE AMLODIPINE TO 5 MG DAILY; YOU CAN TAKE 2 OF THE 2.5 MG TABLETS ONCE A DAY  Labwork: NONE  Testing/Procedures: NONE  Follow-Up: KEEP YOUR APPT WITH DR. Burt Knack ON 03/05/16  Any Other Special Instructions Will Be Listed Below (If Applicable).  If you need a refill on your cardiac medications before your next appointment, please call your pharmacy.   Signed, Richardson Dopp, PA-C  02/23/2016 5:24 PM    [facility] [place], [place], [place]  [address] Phone: [phone]; Fax: [phone]

## 2016-02-23 ENCOUNTER — Ambulatory Visit (INDEPENDENT_AMBULATORY_CARE_PROVIDER_SITE_OTHER): Payer: Medicare Other | Admitting: Physician Assistant

## 2016-02-23 ENCOUNTER — Encounter: Payer: Self-pay | Admitting: Physician Assistant

## 2016-02-23 VITALS — BP 140/80 | HR 80 | Ht 60.0 in | Wt 131.8 lb

## 2016-02-23 DIAGNOSIS — I2510 Atherosclerotic heart disease of native coronary artery without angina pectoris: Secondary | ICD-10-CM

## 2016-02-23 DIAGNOSIS — I25810 Atherosclerosis of coronary artery bypass graft(s) without angina pectoris: Secondary | ICD-10-CM

## 2016-02-23 DIAGNOSIS — I10 Essential (primary) hypertension: Secondary | ICD-10-CM

## 2016-02-23 DIAGNOSIS — E782 Mixed hyperlipidemia: Secondary | ICD-10-CM

## 2016-02-23 DIAGNOSIS — I25119 Atherosclerotic heart disease of native coronary artery with unspecified angina pectoris: Secondary | ICD-10-CM

## 2016-02-23 MED ORDER — AMLODIPINE BESYLATE 5 MG PO TABS: 5.0000 mg | ORAL_TABLET | Freq: Every day | ORAL | Status: DC

## 2016-02-23 NOTE — Patient Instructions (Addendum)
Medication Instructions:  1. INCREASE AMLODIPINE TO 5 MG DAILY; YOU CAN TAKE 2 OF THE 2.5 MG TABLETS ONCE A DAY  Labwork: NONE  Testing/Procedures: NONE  Follow-Up: KEEP YOUR APPT WITH DR. Burt Knack ON 03/05/16  Any Other Special Instructions Will Be Listed Below (If Applicable).  If you need a refill on your cardiac medications before your next appointment, please call your pharmacy.

## 2016-03-02 DIAGNOSIS — Z85828 Personal history of other malignant neoplasm of skin: Secondary | ICD-10-CM | POA: Diagnosis not present

## 2016-03-02 DIAGNOSIS — D485 Neoplasm of uncertain behavior of skin: Secondary | ICD-10-CM | POA: Diagnosis not present

## 2016-03-02 DIAGNOSIS — C44622 Squamous cell carcinoma of skin of right upper limb, including shoulder: Secondary | ICD-10-CM | POA: Diagnosis not present

## 2016-03-02 DIAGNOSIS — L570 Actinic keratosis: Secondary | ICD-10-CM | POA: Diagnosis not present

## 2016-03-02 DIAGNOSIS — D0461 Carcinoma in situ of skin of right upper limb, including shoulder: Secondary | ICD-10-CM | POA: Diagnosis not present

## 2016-03-05 ENCOUNTER — Ambulatory Visit (INDEPENDENT_AMBULATORY_CARE_PROVIDER_SITE_OTHER): Payer: Medicare Other | Admitting: Cardiovascular Disease

## 2016-03-05 ENCOUNTER — Encounter: Payer: Self-pay | Admitting: Cardiovascular Disease

## 2016-03-05 ENCOUNTER — Other Ambulatory Visit: Payer: Self-pay | Admitting: Neurological Surgery

## 2016-03-05 VITALS — BP 110/50 | HR 76 | Ht 60.0 in | Wt 134.4 lb

## 2016-03-05 DIAGNOSIS — I25810 Atherosclerosis of coronary artery bypass graft(s) without angina pectoris: Secondary | ICD-10-CM

## 2016-03-05 DIAGNOSIS — I25119 Atherosclerotic heart disease of native coronary artery with unspecified angina pectoris: Secondary | ICD-10-CM

## 2016-03-05 DIAGNOSIS — M48061 Spinal stenosis, lumbar region without neurogenic claudication: Secondary | ICD-10-CM

## 2016-03-05 NOTE — Progress Notes (Signed)
Cardiology Office Note Date:  03/05/2016   ID:  EME LARIVEE, DOB 1929/02/28, MRN [id number]  PCP:  Binnie Rail, MD  Cardiologist:  Sherren Mocha, MD    Chief Complaint  Patient presents with  . Coronary Artery Disease    denies chest pain, sob, le edema, or claudication     History of Present Illness: Janet Steele is a 82 y.o. female who presents for Follow-up of coronary artery disease and chronic angina. She has a long-standing history of extensive CAD with initial CABG in 1982 and redo CABG in 2003. Other problems include hyperlipidemia, type 2 diabetes, symptomatically bradycardia status post. She was seen about 2 weeks ago by Richardson Dopp with progressive angina. We discussed her case and elected to increase amlodipine to 5 mg daily. Other than some puffiness in her feet, he has done very well with this. She denies recurrence of chest pain. She has a lot of questions about her medications today and we reviewed this extensively. She denies any other new symptoms and feels like she is getting along well since making medication change.   Past Medical History  Diagnosis Date  . Hypothyroidism   . Hyperlipidemia   . GERD (gastroesophageal reflux disease)   . Anxiety   . Diabetes mellitus   . Hypertension   . PVD (peripheral vascular disease) ([place])   . Carotid bruit   . Colon, diverticulosis   . Hiatal hernia   . Osteoarthrosis, unspecified whether generalized or localized, unspecified site   . Degenerative joint disease   . Esophageal stricture   . Unspecified hearing loss   . Diastolic dysfunction   . CAD (coronary artery disease)     a. s/p CABG 1982 b. redo CABG 2003  . Colon polyp     adenomatous  . Anemia     Past Surgical History  Procedure Laterality Date  . Appendectomy    . Cholecystectomy    . Abdominal hysterectomy    . Oophorectomy      bilateral  . Coronary artery bypass graft      x4(1982),02-2002 CABG X2  . Back surgery  01-2000  . Permanent  pacemaker insertion N/A 07/06/2012    MDT Adapta L implanted by Dr Rayann Heman for symptomatic bradycardia  . Left heart catheterization with coronary/graft angiogram N/A 03/05/2014    Procedure: LEFT HEART CATHETERIZATION WITH Beatrix Fetters;  Surgeon: Sinclair Grooms, MD;  Location: [facility] CATH LAB;  Service: Cardiovascular;  Laterality: N/A;  . Colonoscopy      Current Outpatient Prescriptions  Medication Sig Dispense Refill  . amLODipine (NORVASC) 5 MG tablet Take 1 tablet (5 mg total) by mouth daily.    Marland Kitchen aspirin 325 MG EC tablet Take 325 mg by mouth daily.      Marland Kitchen atorvastatin (LIPITOR) 80 MG tablet take 1 tablet by mouth once daily AT 6 PM 30 tablet 3  . diazepam (VALIUM) 2 MG tablet take 1 tablet by mouth every 6 hours if needed for anxiety 30 tablet 3  . DULoxetine (CYMBALTA) 60 MG capsule Take 1 capsule (60 mg total) by mouth daily. 90 capsule 1  . gabapentin (NEURONTIN) 100 MG capsule Take 100 mg by mouth 3 (three) times daily.    Marland Kitchen glimepiride (AMARYL) 2 MG tablet Take 2 mg by mouth daily before breakfast.     . isosorbide mononitrate (IMDUR) 120 MG 24 hr tablet take 1 tablet by mouth once daily 30 tablet 5  . levothyroxine (SYNTHROID,  LEVOTHROID) 112 MCG tablet Take 1 tablet (112 mcg total) by mouth daily. 90 tablet 0  . losartan (COZAAR) 50 MG tablet Take 1 tablet (50 mg total) by mouth daily. 30 tablet 5  . metFORMIN (GLUCOPHAGE) 500 MG tablet Take 1 tablet (500 mg total) by mouth daily with breakfast. 90 tablet 3  . nitroGLYCERIN (NITROSTAT) 0.4 MG SL tablet Place 1 tablet (0.4 mg total) under the tongue every 5 (five) minutes as needed. For chest pain 25 tablet 5  . pantoprazole (PROTONIX) 40 MG tablet Take 1 tablet (40 mg total) by mouth 2 (two) times daily. 30 tablet 6  . ranolazine (RANEXA) 500 MG 12 hr tablet Take 1 tablet (500 mg total) by mouth 2 (two) times daily. 60 tablet 11  . traMADol (ULTRAM) 50 MG tablet Take 50 mg by mouth every 6 (six) hours as needed for  moderate pain (back pain).   0  . traZODone (DESYREL) 50 MG tablet Take 1 tablet (50 mg total) by mouth at bedtime as needed for sleep. 145 tablet 1   No current facility-administered medications for this visit.    Allergies:   Codeine; Morphine; Penicillins; Prednisone; Sulfonamide derivatives; Hydrocodone-acetaminophen; Meperidine hcl; Metoclopramide hcl; Metoprolol succinate; Moxifloxacin; Oxycodone-aspirin; Pentazocine lactate; Pioglitazone; and Tramadol hcl   Social History:  The patient  reports that she quit smoking about 39 years ago. Her smoking use included Cigarettes. She has never used smokeless tobacco. She reports that she drinks alcohol. She reports that she does not use illicit drugs.   Family History:  The patient's  family history includes Heart attack in her sister; Heart disease in her sister. There is no history of Colon cancer, Breast cancer, Celiac disease, Cirrhosis, Clotting disorder, Colitis, Colon polyps, Crohn's disease, Cystic fibrosis, Diabetes, Esophageal cancer, Hemochromatosis, Inflammatory bowel disease, Irritable bowel syndrome, Kidney disease, Liver cancer, Liver disease, Ovarian cancer, Pancreatic cancer, Prostate cancer, Rectal cancer, Stomach cancer, Ulcerative colitis, Uterine cancer, or Wilson's disease.    ROS:  Please see the history of present illness.  Otherwise, review of systems is positive for back pain, easy bruising.  All other systems are reviewed and negative.    PHYSICAL EXAM: VS:  BP 110/50 mmHg  Pulse 76  Ht 5' (1.524 m)  Wt 134 lb 6.4 oz (60.963 kg)  BMI 26.25 kg/m2 , BMI Body mass index is 26.25 kg/(m^2). GEN: Well nourished, well developed, pleasant elderly woman in no acute distress HEENT: normal Neck: no JVD, no masses. No carotid bruits Cardiac: RRR without murmur or gallop                Respiratory:  clear to auscultation bilaterally, normal work of breathing GI: soft, nontender, nondistended, + BS MS: no deformity or  atrophy Ext: no pretibial edema, pedal pulses 2+= bilaterally Skin: warm and dry, no rash Neuro:  Strength and sensation are intact Psych: euthymic mood, full affect  EKG:  EKG is not ordered today.  Recent Labs: 12/23/2015: ALT 18; BUN 16; Creat 0.97*; Hemoglobin 13.9; Platelets 317; Potassium 5.0; Sodium 137; TSH 21.445*   Lipid Panel     Component Value Date/Time   CHOL 212* 05/15/2014 0905   TRIG 182.0* 05/15/2014 0905   HDL 32.80* 05/15/2014 0905   CHOLHDL 6 05/15/2014 0905   VLDL 36.4 05/15/2014 0905   LDLCALC 143* 05/15/2014 0905   LDLDIRECT 98.6 02/23/2010 1255      Wt Readings from Last 3 Encounters:  03/05/16 134 lb 6.4 oz (60.963 kg)  02/23/16 131  lb 12.8 oz (59.784 kg)  02/03/16 130 lb 3.2 oz (59.058 kg)    ASSESSMENT AND PLAN: 1.  CAD, native vessel and graft disease, with angina: CCS II symptoms improved with medication changes. She is on an aggressive antianginal program with Ranexa, amlodipine and isosorbide. This will be continued as she seems to be tolerating well. We'll see her back in 6 months of symptoms progress she will call.  2. Hypertension: Controlled  3. Hyperlipidemia: Treated with atorvastatin 80 mg  Current medicines are reviewed with the patient today.  The patient does not have concerns regarding medicines.  Labs/ tests ordered today include:  No orders of the defined types were placed in this encounter.    Disposition:   FU 6 months  Signed, Sherren Mocha, MD  03/05/2016 5:13 PM    [facility] [address], [place], [place]  [address] Phone: [phone]; Fax: [phone]

## 2016-03-05 NOTE — Patient Instructions (Signed)

## 2016-03-15 ENCOUNTER — Ambulatory Visit
Admission: RE | Admit: 2016-03-15 | Discharge: 2016-03-15 | Disposition: A | Discharge status: Home / Self Care | Payer: Medicare Other | Source: Ambulatory Visit | Attending: Neurological Surgery | Admitting: Neurological Surgery

## 2016-03-15 DIAGNOSIS — M545 Low back pain: Secondary | ICD-10-CM | POA: Diagnosis not present

## 2016-03-15 DIAGNOSIS — M48061 Spinal stenosis, lumbar region without neurogenic claudication: Secondary | ICD-10-CM

## 2016-03-15 MED ORDER — METHYLPREDNISOLONE ACETATE 40 MG/ML INJ SUSP (RADIOLOG
120.0000 mg | Freq: Once | INTRAMUSCULAR | Status: AC
  Administered 2016-03-15: 120 mg via EPIDURAL

## 2016-03-15 MED ORDER — IOHEXOL 180 MG/ML  SOLN
1.0000 mL | Freq: Once | INTRAMUSCULAR | Status: AC | PRN
  Administered 2016-03-15: 1 mL via EPIDURAL

## 2016-03-29 DIAGNOSIS — H02834 Dermatochalasis of left upper eyelid: Secondary | ICD-10-CM | POA: Diagnosis not present

## 2016-03-29 DIAGNOSIS — H02831 Dermatochalasis of right upper eyelid: Secondary | ICD-10-CM | POA: Diagnosis not present

## 2016-04-05 ENCOUNTER — Encounter: Payer: Self-pay | Admitting: Internal Medicine

## 2016-04-05 ENCOUNTER — Ambulatory Visit (INDEPENDENT_AMBULATORY_CARE_PROVIDER_SITE_OTHER): Payer: Medicare Other | Admitting: Internal Medicine

## 2016-04-05 ENCOUNTER — Other Ambulatory Visit (INDEPENDENT_AMBULATORY_CARE_PROVIDER_SITE_OTHER): Payer: Medicare Other

## 2016-04-05 VITALS — BP 128/74 | HR 81 | Temp 97.7°F | Resp 18 | Wt 131.0 lb

## 2016-04-05 DIAGNOSIS — K219 Gastro-esophageal reflux disease without esophagitis: Secondary | ICD-10-CM | POA: Diagnosis not present

## 2016-04-05 DIAGNOSIS — I25810 Atherosclerosis of coronary artery bypass graft(s) without angina pectoris: Secondary | ICD-10-CM | POA: Diagnosis not present

## 2016-04-05 DIAGNOSIS — R1110 Vomiting, unspecified: Secondary | ICD-10-CM | POA: Insufficient documentation

## 2016-04-05 DIAGNOSIS — R112 Nausea with vomiting, unspecified: Secondary | ICD-10-CM

## 2016-04-05 LAB — CBC WITH DIFFERENTIAL/PLATELET
BASOS PCT: 0.4 % (ref 0.0–3.0)
Basophils Absolute: 0 10*3/uL (ref 0.0–0.1)
EOS ABS: 0.2 10*3/uL (ref 0.0–0.7)
EOS PCT: 2.7 % (ref 0.0–5.0)
HCT: 43.3 % (ref 36.0–46.0)
Hemoglobin: 14.5 g/dL (ref 12.0–15.0)
LYMPHS ABS: 1.7 10*3/uL (ref 0.7–4.0)
Lymphocytes Relative: 24.3 % (ref 12.0–46.0)
MCHC: 33.6 g/dL (ref 30.0–36.0)
MCV: 84.8 fl (ref 78.0–100.0)
MONO ABS: 0.4 10*3/uL (ref 0.1–1.0)
Monocytes Relative: 6.1 % (ref 3.0–12.0)
NEUTROS PCT: 66.5 % (ref 43.0–77.0)
Neutro Abs: 4.7 10*3/uL (ref 1.4–7.7)
PLATELETS: 321 10*3/uL (ref 150.0–400.0)
RBC: 5.11 Mil/uL (ref 3.87–5.11)
RDW: 14.6 % (ref 11.5–15.5)
WBC: 7.1 10*3/uL (ref 4.0–10.5)

## 2016-04-05 MED ORDER — RANITIDINE HCL 150 MG PO TABS: 150.0000 mg | ORAL_TABLET | Freq: Every day | ORAL | Status: DC

## 2016-04-05 NOTE — Progress Notes (Signed)
Subjective:    Patient ID: Janet Steele, female    DOB: [birth year], 82 y.o.   MRN: [id number]  HPI She is here for an acute visit.   For about one month she has felt nauseous.  It occurs in the late afternoon or night.  She denies changes in medications or diet.  If she eats dinner earlier she is better.  She has vomited several times and feels better after she vomits.  She denies blood in the vomit. It looks like  Brown liquid and agrees it looks like coffee grounds.    GERD:  She is taking her medication twice daily as prescribed.  She feels GERD sometimes - about 4 times a week.  She denies changes in her stool.  She denies black stool or blood in her stool.    She had an EGD in November and her esophagus was dilated.  She still has occasional dysphagia.  She denies odynophagia.    She takes one aleve about three times a week and has been doing that 3-4 months.  If she takes the aleve she will not take ASA that day.      Medications and allergies reviewed with patient and updated if appropriate.  Patient Active Problem List   Diagnosis Date Noted  . Numbness and tingling of foot 12/31/2015  . MCI (mild cognitive impairment) with memory loss 11/18/2015  . Facial tic 05/07/2015  . Facial nerve spasm 05/07/2015  . Sick sinus syndrome ([place]) 02/26/2015  . Cervical dystonia 12/04/2014  . Chronic motor tic 09/12/2014  . Insomnia due to drug ([place]) 05/03/2014  . NSTEMI (non-ST elevated myocardial infarction) ([place]) 03/06/2014  . Nonspecific abnormal electrocardiogram (ECG) (EKG)   . Chest pain 03/02/2014  . Pacemaker-Medtronic 07/07/2012  . Bradycardia 07/05/2012  . GERD (gastroesophageal reflux disease) 07/05/2012  . Near syncope 12/23/2011  . Abnormal involuntary movements(781.0) 12/17/2009  . SLEEP DISORDER 12/11/2009  . CAD, ARTERY BYPASS GRAFT 09/24/2009  . CAROTID BRUIT 09/24/2009  . Diabetes ([place]) 01/30/2009  . DYSPHAGIA 06/19/2008  . ANXIETY 02/14/2008  . EROSIVE  ESOPHAGITIS 02/14/2008  . CONSTIPATION, CHRONIC 02/14/2008  . DEGENERATIVE JOINT DISEASE 02/14/2008  . HEARING LOSS 01/04/2008  . PVD 01/04/2008  . Hypothyroidism 09/25/2007  . HYPERLIPIDEMIA 09/25/2007  . Essential hypertension 09/07/2007  . HYPERTRIGLYCERIDEMIA 03/16/2007  . Diabetic polyneuropathy ([place]) 03/16/2007  . Stricture and stenosis of esophagus 03/16/2007  . HIATAL HERNIA 02/16/2007  . HEMORRHOIDS 12/31/1997  . DIVERTICULOSIS, COLON 12/31/1997    Current Outpatient Prescriptions on File Prior to Visit  Medication Sig Dispense Refill  . amLODipine (NORVASC) 5 MG tablet Take 1 tablet (5 mg total) by mouth daily.    Marland Kitchen aspirin 325 MG EC tablet Take 325 mg by mouth daily.      Marland Kitchen atorvastatin (LIPITOR) 80 MG tablet take 1 tablet by mouth once daily AT 6 PM 30 tablet 3  . diazepam (VALIUM) 2 MG tablet take 1 tablet by mouth every 6 hours if needed for anxiety 30 tablet 3  . DULoxetine (CYMBALTA) 60 MG capsule Take 1 capsule (60 mg total) by mouth daily. 90 capsule 1  . gabapentin (NEURONTIN) 100 MG capsule Take 100 mg by mouth 3 (three) times daily.    Marland Kitchen glimepiride (AMARYL) 2 MG tablet Take 2 mg by mouth daily before breakfast.     . isosorbide mononitrate (IMDUR) 120 MG 24 hr tablet take 1 tablet by mouth once daily 30 tablet 5  . levothyroxine (SYNTHROID, LEVOTHROID)  112 MCG tablet Take 1 tablet (112 mcg total) by mouth daily. 90 tablet 0  . losartan (COZAAR) 50 MG tablet Take 1 tablet (50 mg total) by mouth daily. 30 tablet 5  . metFORMIN (GLUCOPHAGE) 500 MG tablet Take 1 tablet (500 mg total) by mouth daily with breakfast. 90 tablet 3  . nitroGLYCERIN (NITROSTAT) 0.4 MG SL tablet Place 1 tablet (0.4 mg total) under the tongue every 5 (five) minutes as needed. For chest pain 25 tablet 5  . pantoprazole (PROTONIX) 40 MG tablet Take 1 tablet (40 mg total) by mouth 2 (two) times daily. 30 tablet 6  . ranolazine (RANEXA) 500 MG 12 hr tablet Take 1 tablet (500 mg total) by mouth 2  (two) times daily. 60 tablet 11  . traMADol (ULTRAM) 50 MG tablet Take 50 mg by mouth every 6 (six) hours as needed for moderate pain (back pain).   0  . traZODone (DESYREL) 50 MG tablet Take 1 tablet (50 mg total) by mouth at bedtime as needed for sleep. 145 tablet 1   No current facility-administered medications on file prior to visit.    Past Medical History  Diagnosis Date  . Hypothyroidism   . Hyperlipidemia   . GERD (gastroesophageal reflux disease)   . Anxiety   . Diabetes mellitus   . Hypertension   . PVD (peripheral vascular disease) ([place])   . Carotid bruit   . Colon, diverticulosis   . Hiatal hernia   . Osteoarthrosis, unspecified whether generalized or localized, unspecified site   . Degenerative joint disease   . Esophageal stricture   . Unspecified hearing loss   . Diastolic dysfunction   . CAD (coronary artery disease)     a. s/p CABG 1982 b. redo CABG 2003  . Colon polyp     adenomatous  . Anemia     Past Surgical History  Procedure Laterality Date  . Appendectomy    . Cholecystectomy    . Abdominal hysterectomy    . Oophorectomy      bilateral  . Coronary artery bypass graft      x4(1982),02-2002 CABG X2  . Back surgery  01-2000  . Permanent pacemaker insertion N/A 07/06/2012    MDT Adapta L implanted by Dr Rayann Heman for symptomatic bradycardia  . Left heart catheterization with coronary/graft angiogram N/A 03/05/2014    Procedure: LEFT HEART CATHETERIZATION WITH Beatrix Fetters;  Surgeon: Sinclair Grooms, MD;  Location: [facility] CATH LAB;  Service: Cardiovascular;  Laterality: N/A;  . Colonoscopy      Social History   Social History  . Marital Status: Married    Spouse Name: [name]  . Number of Children: 3  . Years of Education: 16   Occupational History  . Retired    .     Social History Main Topics  . Smoking status: Former Smoker    Types: Cigarettes    Quit date: 07/05/1976  . Smokeless tobacco: Never Used     Comment: smoked age18-  1977, up to 1/2 ppd  . Alcohol Use: 0.0 oz/week    0 Standard drinks or equivalent per week     Comment: Glass a wine a week  . Drug Use: No  . Sexual Activity: No   Other Topics Concern  . Not on file   Social History Narrative   Patient is married [name]) and lives at home with her husband.   Patient has three adult children.   Patient is retired.   Patient  has a Financial risk analyst.   Patient is right-handed.   Patient does not drink any caffeine.    Family History  Problem Relation Age of Onset  . Heart disease Sister   . Heart attack Sister   . Colon cancer Neg Hx   . Breast cancer Neg Hx   . Celiac disease Neg Hx   . Cirrhosis Neg Hx   . Clotting disorder Neg Hx   . Colitis Neg Hx   . Colon polyps Neg Hx   . Crohn's disease Neg Hx   . Cystic fibrosis Neg Hx   . Diabetes Neg Hx   . Esophageal cancer Neg Hx   . Hemochromatosis Neg Hx   . Inflammatory bowel disease Neg Hx   . Irritable bowel syndrome Neg Hx   . Kidney disease Neg Hx   . Liver cancer Neg Hx   . Liver disease Neg Hx   . Ovarian cancer Neg Hx   . Pancreatic cancer Neg Hx   . Prostate cancer Neg Hx   . Rectal cancer Neg Hx   . Stomach cancer Neg Hx   . Ulcerative colitis Neg Hx   . Uterine cancer Neg Hx   . Wilson's disease Neg Hx     Review of Systems  Constitutional: Positive for fatigue (increased fatigue in last month). Negative for fever, appetite change and unexpected weight change.  Gastrointestinal: Positive for nausea, vomiting and abdominal pain (chornic epigastric - not more than usual). Negative for diarrhea, constipation and blood in stool (no black stool).  Neurological: Negative for light-headedness and headaches.       Objective:   Filed Vitals:   04/05/16 1613  BP: 128/74  Pulse: 81  Temp: 97.7 F (36.5 C)  Resp: 18   Filed Weights   04/05/16 1613  Weight: 131 lb (59.421 kg)   Body mass index is 25.58 kg/(m^2).   Physical Exam Constitutional: Appears  well-developed and well-nourished. No distress.    Cardiovascular: Normal rate, regular rhythm and normal heart sounds.   No murmur heard.  No edema Pulmonary/Chest: Effort normal and breath sounds normal. No respiratory distress. No wheezes.  Abdomen: soft, epigastric tenderness - chronic and not more than usual, no rebound or guarding, no other abdominal pain      Assessment & Plan:   See Problem List for Assessment and Plan of chronic medical problems.

## 2016-04-05 NOTE — Assessment & Plan Note (Signed)
Brown liquid - possible coffee grounds - concern for UGI bleed - possibly related to aleve 3 times a week in addition to ASA 325 mg (which she does hold the days she takes aleve) Check cbc Stop aleve Continue protonix 40 mg twice daily Start zantac 150mg  daily May need to see Dr Henrene Pastor depending on cbc and symptoms

## 2016-04-05 NOTE — Assessment & Plan Note (Signed)
Uncontrolled Stressed GERD diet, avoiding eating within three hrs of laying down Continue pantoprazole 40 mg twice daily Add zantac Concern for possible gastritis - check cbc Stop aleve - try tylenol, can increase gabapentin to 200mg  at night May need to see Dr Henrene Pastor again if cbc is low

## 2016-04-05 NOTE — Progress Notes (Signed)
Pre visit review using our clinic review tool, if applicable. No additional management support is needed unless otherwise documented below in the visit note. 

## 2016-04-05 NOTE — Patient Instructions (Addendum)
Stop taking aleve - this can increase your risk of stomach bleeding.  Continue the pantoprazole twice a day.   Have blood work done today to check your blood counts.   Start zantac (ranitidine) at night before bed.     We will call you tomorrow with the results.  If your symptoms get worse let us know.   Do not eat within three hours of laying down.      Gastroesophageal Reflux Disease, Adult Normally, food travels down the esophagus and stays in the stomach to be digested. However, when a person has gastroesophageal reflux disease (GERD), food and stomach acid move back up into the esophagus. When this happens, the esophagus becomes sore and inflamed. Over time, GERD can create small holes (ulcers) in the lining of the esophagus.  CAUSES This condition is caused by a problem with the muscle between the esophagus and the stomach (lower esophageal sphincter, or LES). Normally, the LES muscle closes after food passes through the esophagus to the stomach. When the LES is weakened or abnormal, it does not close properly, and that allows food and stomach acid to go back up into the esophagus. The LES can be weakened by certain dietary substances, medicines, and medical conditions, including:  Tobacco use.  Pregnancy.  Having a hiatal hernia.  Heavy alcohol use.  Certain foods and beverages, such as coffee, chocolate, onions, and peppermint. RISK FACTORS This condition is more likely to develop in:  People who have an increased body weight.  People who have connective tissue disorders.  People who use NSAID medicines. SYMPTOMS Symptoms of this condition include:  Heartburn.  Difficult or painful swallowing.  The feeling of having a lump in the throat.  Abitter taste in the mouth.  Bad breath.  Having a large amount of saliva.  Having an upset or bloated stomach.  Belching.  Chest pain.  Shortness of breath or wheezing.  Ongoing (chronic) cough or a night-time  cough.  Wearing away of tooth enamel.  Weight loss. Different conditions can cause chest pain. Make sure to see your health care provider if you experience chest pain. DIAGNOSIS Your health care provider will take a medical history and perform a physical exam. To determine if you have mild or severe GERD, your health care provider may also monitor how you respond to treatment. You may also have other tests, including:  An endoscopy toexamine your stomach and esophagus with a small camera.  A test thatmeasures the acidity level in your esophagus.  A test thatmeasures how much pressure is on your esophagus.  A barium swallow or modified barium swallow to show the shape, size, and functioning of your esophagus. TREATMENT The goal of treatment is to help relieve your symptoms and to prevent complications. Treatment for this condition may vary depending on how severe your symptoms are. Your health care provider may recommend:  Changes to your diet.  Medicine.  Surgery. HOME CARE INSTRUCTIONS Diet  Follow a diet as recommended by your health care provider. This may involve avoiding foods and drinks such as:  Coffee and tea (with or without caffeine).  Drinks that containalcohol.  Energy drinks and sports drinks.  Carbonated drinks or sodas.  Chocolate and cocoa.  Peppermint and mint flavorings.  Garlic and onions.  Horseradish.  Spicy and acidic foods, including peppers, chili powder, curry powder, vinegar, hot sauces, and barbecue sauce.  Citrus fruit juices and citrus fruits, such as oranges, lemons, and limes.  Tomato-based foods, such as red  sauce, chili, salsa, and pizza with red sauce.  Fried and fatty foods, such as donuts, french fries, potato chips, and high-fat dressings.  High-fat meats, such as hot dogs and fatty cuts of red and white meats, such as rib eye steak, sausage, ham, and bacon.  High-fat dairy items, such as whole milk, butter, and cream  cheese.  Eat small, frequent meals instead of large meals.  Avoid drinking large amounts of liquid with your meals.  Avoid eating meals during the 2-3 hours before bedtime.  Avoid lying down right after you eat.  Do not exercise right after you eat. General Instructions  Pay attention to any changes in your symptoms.  Take over-the-counter and prescription medicines only as told by your health care provider. Do not take aspirin, ibuprofen, or other NSAIDs unless your health care provider told you to do so.  Do not use any tobacco products, including cigarettes, chewing tobacco, and e-cigarettes. If you need help quitting, ask your health care provider.  Wear loose-fitting clothing. Do not wear anything tight around your waist that causes pressure on your abdomen.  Raise (elevate) the head of your bed 6 inches (15cm).  Try to reduce your stress, such as with yoga or meditation. If you need help reducing stress, ask your health care provider.  If you are overweight, reduce your weight to an amount that is healthy for you. Ask your health care provider for guidance about a safe weight loss goal.  Keep all follow-up visits as told by your health care provider. This is important. SEEK MEDICAL CARE IF:  You have new symptoms.  You have unexplained weight loss.  You have difficulty swallowing, or it hurts to swallow.  You have wheezing or a persistent cough.  Your symptoms do not improve with treatment.  You have a hoarse voice. SEEK IMMEDIATE MEDICAL CARE IF:  You have pain in your arms, neck, jaw, teeth, or back.  You feel sweaty, dizzy, or light-headed.  You have chest pain or shortness of breath.  You vomit and your vomit looks like blood or coffee grounds.  You faint.  Your stool is bloody or black.  You cannot swallow, drink, or eat.   This information is not intended to replace advice given to you by your health care provider. Make sure you discuss any  questions you have with your health care provider.   Document Released: 09/01/2005 Document Revised: 08/13/2015 Document Reviewed: 03/19/2015 Elsevier Interactive Patient Education [employer].

## 2016-04-15 DIAGNOSIS — H1033 Unspecified acute conjunctivitis, bilateral: Secondary | ICD-10-CM | POA: Diagnosis not present

## 2016-04-15 DIAGNOSIS — H04123 Dry eye syndrome of bilateral lacrimal glands: Secondary | ICD-10-CM | POA: Diagnosis not present

## 2016-04-19 DIAGNOSIS — E119 Type 2 diabetes mellitus without complications: Secondary | ICD-10-CM | POA: Diagnosis not present

## 2016-04-19 DIAGNOSIS — H02834 Dermatochalasis of left upper eyelid: Secondary | ICD-10-CM | POA: Diagnosis not present

## 2016-04-19 DIAGNOSIS — H02831 Dermatochalasis of right upper eyelid: Secondary | ICD-10-CM | POA: Diagnosis not present

## 2016-04-29 ENCOUNTER — Telehealth: Payer: Self-pay | Admitting: Emergency Medicine

## 2016-04-29 NOTE — Telephone Encounter (Signed)
Spoke with pts Husband. Pt needs to come in for thyroid labs before getting a refill on medication.

## 2016-04-30 ENCOUNTER — Other Ambulatory Visit (INDEPENDENT_AMBULATORY_CARE_PROVIDER_SITE_OTHER): Payer: Medicare Other

## 2016-04-30 ENCOUNTER — Other Ambulatory Visit: Payer: Self-pay

## 2016-04-30 DIAGNOSIS — E039 Hypothyroidism, unspecified: Secondary | ICD-10-CM | POA: Diagnosis not present

## 2016-04-30 LAB — TSH: TSH: 4.89 u[IU]/mL — ABNORMAL HIGH (ref 0.35–4.50)

## 2016-05-04 ENCOUNTER — Telehealth: Payer: Self-pay | Admitting: Cardiology

## 2016-05-04 ENCOUNTER — Other Ambulatory Visit: Payer: Self-pay | Admitting: Emergency Medicine

## 2016-05-04 ENCOUNTER — Ambulatory Visit (INDEPENDENT_AMBULATORY_CARE_PROVIDER_SITE_OTHER): Payer: Medicare Other | Admitting: *Deleted

## 2016-05-04 DIAGNOSIS — I495 Sick sinus syndrome: Secondary | ICD-10-CM | POA: Diagnosis not present

## 2016-05-04 MED ORDER — LEVOTHYROXINE SODIUM 125 MCG PO TABS: 125.0000 ug | ORAL_TABLET | Freq: Every day | ORAL | Status: DC

## 2016-05-04 NOTE — Telephone Encounter (Signed)
Confirmed remote transmission w/ pt husband.   

## 2016-05-04 NOTE — Telephone Encounter (Signed)
New RX for Levothyroxine 125 mcg per MD request.

## 2016-05-06 ENCOUNTER — Ambulatory Visit: Payer: Medicare Other | Admitting: Adult Health

## 2016-05-06 ENCOUNTER — Telehealth: Payer: Self-pay | Admitting: Adult Health

## 2016-05-06 ENCOUNTER — Ambulatory Visit: Payer: Medicare Other | Admitting: Nurse Practitioner

## 2016-05-06 NOTE — Telephone Encounter (Signed)
[facility]/[address] called to advise that the pt was there. Her appt was with Jinny Blossom today at 1:30 (the call came in at 1:30). Pt was put on the phone and I asked pt if she wanted to r/s her appt now or wait to call back. She requested to r/s, appt was given for today at 2pm with Hoyle Sauer. Pt asked "now where am I going", I gave her directions. She said "oh I know where you are". She then asked "am I going home"? I said if you want to but you wanted to be seen today. Pt said "ok and who will I be seeing" and I repeated Hoyle Sauer. She asked again if she was going home. I said you wanted to come to [facility] to see Hoyle Sauer today. She said "ok, I know where you are". MaryClare came to call center about 2:20 and said pt still has not shown. We agreed operator would call her at home to check on her. I called and pt answered the phone. She said "she has been trying to find your number to call to tell you I got turned around. I could see you from [facility] but could not take a side street to get to you. I just came on home". Operator said I was calling to check on her to make sure she was ok. Operator asked if she wanted to r/s with [facility] and we did to 6/8. Operator asked if she needed directions, she said "I know where you are, I've been there before."

## 2016-05-06 NOTE — Progress Notes (Signed)
Remote pacemaker transmission.   

## 2016-05-12 ENCOUNTER — Encounter: Payer: Self-pay | Admitting: Nurse Practitioner

## 2016-05-12 DIAGNOSIS — R079 Chest pain, unspecified: Secondary | ICD-10-CM | POA: Diagnosis not present

## 2016-05-13 ENCOUNTER — Ambulatory Visit: Payer: Medicare Other | Admitting: Adult Health

## 2016-05-14 ENCOUNTER — Encounter: Payer: Self-pay | Admitting: Adult Health

## 2016-05-17 ENCOUNTER — Encounter: Payer: Self-pay | Admitting: Cardiovascular Disease

## 2016-05-17 ENCOUNTER — Ambulatory Visit (INDEPENDENT_AMBULATORY_CARE_PROVIDER_SITE_OTHER): Payer: Medicare Other | Admitting: Cardiovascular Disease

## 2016-05-17 ENCOUNTER — Telehealth: Payer: Self-pay | Admitting: Adult Health

## 2016-05-17 VITALS — BP 118/60 | HR 84 | Ht 60.0 in | Wt 130.0 lb

## 2016-05-17 DIAGNOSIS — I200 Unstable angina: Secondary | ICD-10-CM

## 2016-05-17 DIAGNOSIS — I25700 Atherosclerosis of coronary artery bypass graft(s), unspecified, with unstable angina pectoris: Secondary | ICD-10-CM

## 2016-05-17 MED ORDER — CLOPIDOGREL BISULFATE 75 MG PO TABS: 75.0000 mg | ORAL_TABLET | Freq: Every day | ORAL | Status: DC

## 2016-05-17 MED ORDER — METOPROLOL TARTRATE 25 MG PO TABS: 25.0000 mg | ORAL_TABLET | Freq: Two times a day (BID) | ORAL | Status: DC

## 2016-05-17 NOTE — Patient Instructions (Addendum)
Medication Instructions:  Your physician has recommended you make the following change in your medication:  1. START Metoprolol Tartrate 25mg  take one tablet by mouth twice a day 2. Take Plavix (clopidogrel) 300mg  (four tablets at one time) tonight, Starting tomorrow take 75mg  one tablet by mouth daily  Labwork: Your physician recommends that you return for lab work tomorrow 05/18/2016--lab hours 7:30-4:30, BMP, CBC and PT/INR  Testing/Procedures: Your physician has requested that you have a cardiac catheterization. Cardiac catheterization is used to diagnose and/or treat various heart conditions. Doctors may recommend this procedure for a number of different reasons. The most common reason is to evaluate chest pain. Chest pain can be a symptom of coronary artery disease (CAD), and cardiac catheterization can show whether plaque is narrowing or blocking your heart's arteries. This procedure is also used to evaluate the valves, as well as measure the blood flow and oxygen levels in different parts of your heart. For further information please visit [email or web address]. Please follow instruction sheet, as given.  Follow-Up: We will arrange further follow-up after your cardiac catheterization.   Any Other Special Instructions Will Be Listed Below (If Applicable).     If you need a refill on your cardiac medications before your next appointment, please call your pharmacy.

## 2016-05-17 NOTE — Telephone Encounter (Signed)
Left message for a return call

## 2016-05-17 NOTE — Telephone Encounter (Signed)
Sharyn Lull, can you call the patient's husband tomorrow?

## 2016-05-17 NOTE — Telephone Encounter (Signed)
Patient's husband is calling in regard to letter about his wife missing 2 appointments.  Phone [phone] tomorrow as he is taking his wife to another appt this afternoon.

## 2016-05-17 NOTE — Progress Notes (Signed)
Cardiology Office Note Date:  05/17/2016   ID:  Janet Steele, DOB 1929/10/14, MRN [id number]  PCP:  Binnie Rail, MD  Cardiologist:  Sherren Mocha, MD    Chief Complaint  Patient presents with  . Aortic Stenosis  . hypertension pulmonary  . Coronary Artery Disease  . Chest Pain     History of Present Illness: Janet Steele is a 82 y.o. female who presents for follow-up evaluation. She was last seen 03/05/2016. The patient is followed for coronary artery disease and chronic angina. She has extensive CAD with initial CABG in 1982 and redo CABG in 2003. Other medical problems include hyperlipidemia, type 2 diabetes, and symptomatic bradycardia status post permanent pacemaker placement. She has been seen frequently for chronic angina. Earlier this year she had worsening angina and amlodipine was increased. This helped her symptoms and when I saw her last in March she was doing well. Unfortunately she developed recurrent symptoms of sharp chest discomfort radiating to her left arm. These have been her typical anginal symptoms. Symptoms are worse over the last 2 weeks. In the last 24 hours has taken 6 nitroglycerin with relief of her chest pain each time. Her regimen includes amlodipine, high-dose isosorbide, and Ranexa. She does not tolerate the Ranexa well and she only takes it intermittently because of dizziness. Her last heart catheterization in 2015 is reviewed today with the results outlined below.  The patient's last episode of chest discomfort was early this morning. She's had no recurrence later in the morning or this afternoon. She complains of associated shortness of breath. There is no lightheadedness, syncope, or heart palpitations. She has a long list of drug allergies. The patient is here with her husband today.  Past Medical History  Diagnosis Date  . Hypothyroidism   . Hyperlipidemia   . GERD (gastroesophageal reflux disease)   . Anxiety   . Diabetes mellitus   .  Hypertension   . PVD (peripheral vascular disease) ([place])   . Carotid bruit   . Colon, diverticulosis   . Hiatal hernia   . Osteoarthrosis, unspecified whether generalized or localized, unspecified site   . Degenerative joint disease   . Esophageal stricture   . Unspecified hearing loss   . Diastolic dysfunction   . CAD (coronary artery disease)     a. s/p CABG 1982 b. redo CABG 2003  . Colon polyp     adenomatous  . Anemia     Past Surgical History  Procedure Laterality Date  . Appendectomy    . Cholecystectomy    . Abdominal hysterectomy    . Oophorectomy      bilateral  . Coronary artery bypass graft      x4(1982),02-2002 CABG X2  . Back surgery  01-2000  . Permanent pacemaker insertion N/A 07/06/2012    MDT Adapta L implanted by Dr Rayann Heman for symptomatic bradycardia  . Left heart catheterization with coronary/graft angiogram N/A 03/05/2014    Procedure: LEFT HEART CATHETERIZATION WITH Beatrix Fetters;  Surgeon: Sinclair Grooms, MD;  Location: [facility] CATH LAB;  Service: Cardiovascular;  Laterality: N/A;  . Colonoscopy      Current Outpatient Prescriptions  Medication Sig Dispense Refill  . amLODipine (NORVASC) 5 MG tablet Take 1 tablet (5 mg total) by mouth daily.    Marland Kitchen aspirin 325 MG EC tablet Take 325 mg by mouth daily.      Marland Kitchen atorvastatin (LIPITOR) 80 MG tablet take 1 tablet by mouth once daily AT 6  PM 30 tablet 3  . diazepam (VALIUM) 2 MG tablet take 1 tablet by mouth every 6 hours if needed for anxiety 30 tablet 3  . DULoxetine (CYMBALTA) 60 MG capsule Take 1 capsule (60 mg total) by mouth daily. 90 capsule 1  . gabapentin (NEURONTIN) 100 MG capsule Take 100 mg by mouth 3 (three) times daily.    Marland Kitchen glimepiride (AMARYL) 2 MG tablet Take 2 mg by mouth daily before breakfast.     . isosorbide mononitrate (IMDUR) 120 MG 24 hr tablet take 1 tablet by mouth once daily 30 tablet 5  . levothyroxine (SYNTHROID, LEVOTHROID) 125 MCG tablet Take 1 tablet (125 mcg total) by  mouth daily. 90 tablet 1  . losartan (COZAAR) 50 MG tablet Take 1 tablet (50 mg total) by mouth daily. 30 tablet 5  . metFORMIN (GLUCOPHAGE) 500 MG tablet Take 1 tablet (500 mg total) by mouth daily with breakfast. 90 tablet 3  . nitroGLYCERIN (NITROSTAT) 0.4 MG SL tablet Place 1 tablet (0.4 mg total) under the tongue every 5 (five) minutes as needed. For chest pain 25 tablet 5  . oxyCODONE-acetaminophen (PERCOCET/ROXICET) 5-325 MG tablet Take 1 tablet by mouth every 4 (four) hours as needed for moderate pain. Take 1 tablet by mouth every 4-6 hours as needed for pain  0  . pantoprazole (PROTONIX) 40 MG tablet Take 1 tablet (40 mg total) by mouth 2 (two) times daily. 30 tablet 6  . ranitidine (ZANTAC) 150 MG tablet Take 1 tablet (150 mg total) by mouth at bedtime. 30 tablet 3  . traMADol (ULTRAM) 50 MG tablet Take 50 mg by mouth every 6 (six) hours as needed for moderate pain (back pain).   0  . traZODone (DESYREL) 50 MG tablet Take 1 tablet (50 mg total) by mouth at bedtime as needed for sleep. 145 tablet 1  . clopidogrel (PLAVIX) 75 MG tablet Take 1 tablet (75 mg total) by mouth daily. 30 tablet 6  . metoprolol tartrate (LOPRESSOR) 25 MG tablet Take 1 tablet (25 mg total) by mouth 2 (two) times daily. 60 tablet 6   No current facility-administered medications for this visit.    Allergies:   Morphine; Penicillins; Prednisone; Sulfonamide derivatives; Codeine; Hydrocodone-acetaminophen; Meperidine hcl; Metoclopramide hcl; Metoprolol succinate; Moxifloxacin; Oxycodone-aspirin; Pentazocine lactate; and Pioglitazone   Social History:  The patient  reports that she quit smoking about 39 years ago. Her smoking use included Cigarettes. She has never used smokeless tobacco. She reports that she drinks alcohol. She reports that she does not use illicit drugs.   Family History:  The patient's family history includes Heart attack in her sister; Heart disease in her sister. There is no history of Colon  cancer, Breast cancer, Celiac disease, Cirrhosis, Clotting disorder, Colitis, Colon polyps, Crohn's disease, Cystic fibrosis, Diabetes, Esophageal cancer, Hemochromatosis, Inflammatory bowel disease, Irritable bowel syndrome, Kidney disease, Liver cancer, Liver disease, Ovarian cancer, Pancreatic cancer, Prostate cancer, Rectal cancer, Stomach cancer, Ulcerative colitis, Uterine cancer, or Wilson's disease.    ROS:  Please see the history of present illness.  Otherwise, review of systems is positive for tremor, fatigue.  All other systems are reviewed and negative.    PHYSICAL EXAM: VS:  BP 118/60 mmHg  Pulse 84  Ht 5' (1.524 m)  Wt 130 lb (58.968 kg)  BMI 25.39 kg/m2 , BMI Body mass index is 25.39 kg/(m^2). GEN: Well nourished, well developed, elderly woman in no acute distress HEENT: normal Neck: no JVD, no masses. bilateral carotid bruits Cardiac:  RRR with 2/6 SEM at the RUSB, no diastolic murmur               Respiratory:  clear to auscultation bilaterally, normal work of breathing GI: soft, nontender, nondistended, + BS MS: no deformity or atrophy Ext: no pretibial edema Skin: warm and dry, no rash Neuro:  Strength and sensation are intact Psych: euthymic mood, full affect  EKG:  EKG is ordered today. The ekg ordered today shows electronic atrial pacemaker 84 bpm, age indeterminate inferior infarct, T-wave abnormality consider lateral ischemia  Recent Labs: 12/23/2015: ALT 18; BUN 16; Creat 0.97*; Potassium 5.0; Sodium 137 04/05/2016: Hemoglobin 14.5; Platelets 321.0 04/30/2016: TSH 4.89*   Lipid Panel     Component Value Date/Time   CHOL 212* 05/15/2014 0905   TRIG 182.0* 05/15/2014 0905   HDL 32.80* 05/15/2014 0905   CHOLHDL 6 05/15/2014 0905   VLDL 36.4 05/15/2014 0905   LDLCALC 143* 05/15/2014 0905   LDLDIRECT 98.6 02/23/2010 1255      Wt Readings from Last 3 Encounters:  05/17/16 130 lb (58.968 kg)  04/05/16 131 lb (59.421 kg)  03/05/16 134 lb 6.4 oz (60.963 kg)       Cardiac Studies Reviewed: Cardiac Cath 03-05-2014: INDICATIONS: Acute coronary syndrome in this 82 year old with history of coronary artery bypass grafting 1982 and 2002  PROCEDURE: 1. Left heart catheterization; 2. Coronary angiography; 3. Left ventriculography; 4. Saphenous vein graft angiography; 5. Internal mammary artery angiography  CONSENT:  The risks, benefits, and details of the procedure were explained in detail to the patient. Risks including death, stroke, heart attack, kidney injury, allergy, limb ischemia, bleeding and radiation injury were discussed. The patient verbalized understanding and wanted to proceed. Informed written consent was obtained.  PROCEDURE TECHNIQUE: After Xylocaine anesthesia a 5 French sheath was placed in the right femoral artery with a single anterior needle wall stick. Coronary angiography was done using a 5 French A2 MP, left bypass graft catheter, 4 cm left Judkins catheter, and a 5 [place] internal mammary artery catheters. Left ventriculography was done using the 5 French A2 multipurpose catheter and hand injection.   The arterial sheath was removed and hemostasis achieved by manual compression.  MEDICATIONS: Versed 1 mg IV and fentanyl 50 mcg IV   CONTRAST: Total of 145 cc.  COMPLICATIONS: None   HEMODYNAMICS: Aortic pressure 87/47 mmHg; LV pressure 87/0 mmHg; LVEDP 8 mm mercury  ANGIOGRAPHIC DATA: The left main coronary artery is patent.  The left anterior descending artery is totally occluded in the mid vessel and 99% in the proximal vessel. The proximal lesion is new. The vascular territory is small.  The left circumflex artery is totally occluded as home prior angiogram in 2002.  The right coronary artery is totally occluded.Marland Kitchen  BYPASS GRAFT ANGIOGRAPHY:   The saphenous vein graft to the diagonal/LAD is totally occluded.  The saphenous vein graft to the circumflex is totally occluded.  The saphenous vein graft to the  PDA is widely patent. There is diffuse disease in the connection between the PDA and 2 left ventricular branches. There is also moderate diffuse disease in the PDA beyond the graft insertion site. No high-grade focal stenoses are noted. Progression of the disease in the segment between the PDA and left ventricular branches is noted.  LIMA to the LAD is widely patent with a large diagonal also filling. The LAD supplies collaterals to the circumflex.   PCI RESULTS: Not performed  LEFT VENTRICULOGRAM: Left ventricular angiogram was done in the  30 RAO projection and revealed severe inferobasal and mid inferior wall hypokinesis with an ejection fraction of 35-40% no significant mitral regurgitation is identified.   IMPRESSIONS: 1. Total occlusion of LAD, circumflex, and RCA. The LAD occlusion is mid vessel and there is a new 99% proximal stenosis. The vascular territory supplied between the 2 stenoses in the LAD is relatively small. This may be a source of some of the patient's angina.  2. Total occlusion of the saphenous vein graft to the circumflex; total occlusion of the saphenous vein graft to the diagonal/LAD; and widely patent saphenous vein graft to the PDA.  3. The saphenous vein graft to the PDA as mentioned above is widely patent and there is progression of disease in the continuation of the right coronary proximal to the origin of 2 left ventricular branches (the continuation LV branches fill retrograde off the PDA proximal to the graft insertion site).  4. Patent LIMA to the LAD   RECOMMENDATION: Continue aggressive medical therapy.  ASSESSMENT AND PLAN: CAD, native vessel and bypass graft disease, with accelerating angina. The patient presents today with crescendo angina, relieved with nitroglycerin and rest. She has had symptoms with minimal activity. We discussed options of hospital admission for IV heparin versus outpatient catheterization as soon as possible. I have reviewed her  medical program at length today. Metoprolol succinate is listed as an allergy but the reaction is "nervousness." I am going to try her on metoprolol tartrate 25 mg twice daily. She should continue on amlodipine, Ranexa, and high-dose isosorbide. The patient will be scheduled for cardiac catheterization at the next available time. She understands that if symptoms progress or she has nitroglycerin refractory chest pain, she needs to call [phone] immediately. Her husband is here with her today and also understands the instructions clearly. I am going to start her on Plavix 300 mg today and 75 mg daily starting tomorrow.  I have reviewed the risks, indications, and alternatives to cardiac catheterization, possible angioplasty, and stenting with the patient. Risks include but are not limited to bleeding, infection, vascular injury, stroke, myocardial infection, arrhythmia, kidney injury, radiation-related injury in the case of prolonged fluoroscopy use, emergency cardiac surgery, and death. The patient understands the risks of serious complication is 1-2 in [id number] with diagnostic cardiac cath and 1-2% or less with angioplasty/stenting.   Last cath study reviewed and she has 2 patent grafts at that time. Hopefully her anatomy will be conducive to PCI as there is no room to further titrate her anti-anginal program. She understands options may be limited.   2. Essential HTN: controlled  3. Hyperlipidemia: treated with high-dose atorvastatin  Current medicines are reviewed with the patient today.  The patient does not have concerns regarding medicines.  Labs/ tests ordered today include:   Orders Placed This Encounter  Procedures  . Basic Metabolic Panel (BMET)  . CBC  . INR/PT   Disposition:   FU pending cardiac cath results  Signed, Sherren Mocha, MD  05/17/2016 6:44 PM    [address], [place], [place]  [address] Phone: [phone]; Fax: [phone]

## 2016-05-18 ENCOUNTER — Other Ambulatory Visit: Payer: Self-pay

## 2016-05-18 ENCOUNTER — Other Ambulatory Visit (INDEPENDENT_AMBULATORY_CARE_PROVIDER_SITE_OTHER): Payer: Medicare Other | Admitting: *Deleted

## 2016-05-18 DIAGNOSIS — I200 Unstable angina: Secondary | ICD-10-CM

## 2016-05-18 DIAGNOSIS — I25700 Atherosclerosis of coronary artery bypass graft(s), unspecified, with unstable angina pectoris: Secondary | ICD-10-CM | POA: Diagnosis not present

## 2016-05-18 LAB — BASIC METABOLIC PANEL
BUN: 13 mg/dL (ref 7–25)
CHLORIDE: 99 mmol/L (ref 98–110)
CO2: 23 mmol/L (ref 20–31)
Calcium: 9.7 mg/dL (ref 8.6–10.4)
Creat: 0.84 mg/dL (ref 0.60–0.88)
GLUCOSE: 213 mg/dL — AB (ref 65–99)
POTASSIUM: 4.1 mmol/L (ref 3.5–5.3)
SODIUM: 136 mmol/L (ref 135–146)

## 2016-05-18 LAB — CUP PACEART REMOTE DEVICE CHECK
Battery Impedance: 181 Ohm
Battery Remaining Longevity: 120 mo
Battery Voltage: 2.79 V
Brady Statistic AP VP Percent: 0 %
Date Time Interrogation Session: 20170531160527
Implantable Lead Implant Date: 20130801
Implantable Lead Location: 753860
Implantable Lead Model: 5076
Implantable Lead Model: 5092
Lead Channel Impedance Value: 610 Ohm
Lead Channel Setting Sensing Sensitivity: 5.6 mV
MDC IDC LEAD IMPLANT DT: 20130801
MDC IDC LEAD LOCATION: 753859
MDC IDC MSMT LEADCHNL RA IMPEDANCE VALUE: 499 Ohm
MDC IDC SET LEADCHNL RA PACING AMPLITUDE: 2.25 V
MDC IDC SET LEADCHNL RV PACING AMPLITUDE: 2.5 V
MDC IDC SET LEADCHNL RV PACING PULSEWIDTH: 0.4 ms
MDC IDC STAT BRADY AP VS PERCENT: 96 %
MDC IDC STAT BRADY AS VP PERCENT: 0 %
MDC IDC STAT BRADY AS VS PERCENT: 4 %

## 2016-05-18 LAB — CBC
HEMATOCRIT: 39.3 % (ref 35.0–45.0)
HEMOGLOBIN: 13.4 g/dL (ref 11.7–15.5)
MCH: 28.7 pg (ref 27.0–33.0)
MCHC: 34.1 g/dL (ref 32.0–36.0)
MCV: 84.2 fL (ref 80.0–100.0)
MPV: 10 fL (ref 7.5–12.5)
Platelets: 296 10*3/uL (ref 140–400)
RBC: 4.67 MIL/uL (ref 3.80–5.10)
RDW: 13.6 % (ref 11.0–15.0)
WBC: 7.4 10*3/uL (ref 3.8–10.8)

## 2016-05-18 LAB — PROTIME-INR
INR: 1
Prothrombin Time: 10.5 s (ref 9.0–11.5)

## 2016-05-18 MED ORDER — NITROGLYCERIN 0.4 MG SL SUBL: 0.4000 mg | SUBLINGUAL_TABLET | SUBLINGUAL | Status: DC | PRN

## 2016-05-18 NOTE — Telephone Encounter (Signed)
I have spoken to the patient's husband.  States the first missed appt on 05/06/16 was due to them being lost.  Says the second missed appt on 05/13/16 was due to the patient requiring emergency care.  He would like this noted in her chart.

## 2016-05-18 NOTE — Addendum Note (Signed)
Addended by: Eulis Foster on: 05/18/2016 01:24 PM   Modules accepted: Orders

## 2016-05-19 ENCOUNTER — Ambulatory Visit (HOSPITAL_COMMUNITY)
Admission: RE | Admit: 2016-05-19 | Discharge: 2016-05-20 | Disposition: A | Discharge status: Home / Self Care | Payer: Medicare Other | Source: Ambulatory Visit | Attending: Interventional Cardiology | Admitting: Interventional Cardiology

## 2016-05-19 ENCOUNTER — Encounter (HOSPITAL_COMMUNITY): Payer: Self-pay | Admitting: General Practice

## 2016-05-19 ENCOUNTER — Encounter (HOSPITAL_COMMUNITY)
Admission: RE | Disposition: A | Discharge status: Home / Self Care | Payer: Self-pay | Source: Ambulatory Visit | Attending: Interventional Cardiology

## 2016-05-19 ENCOUNTER — Other Ambulatory Visit: Payer: Self-pay | Admitting: Cardiovascular Disease

## 2016-05-19 DIAGNOSIS — R001 Bradycardia, unspecified: Secondary | ICD-10-CM | POA: Insufficient documentation

## 2016-05-19 DIAGNOSIS — Z7982 Long term (current) use of aspirin: Secondary | ICD-10-CM | POA: Insufficient documentation

## 2016-05-19 DIAGNOSIS — M1990 Unspecified osteoarthritis, unspecified site: Secondary | ICD-10-CM | POA: Insufficient documentation

## 2016-05-19 DIAGNOSIS — I2510 Atherosclerotic heart disease of native coronary artery without angina pectoris: Secondary | ICD-10-CM

## 2016-05-19 DIAGNOSIS — F419 Anxiety disorder, unspecified: Secondary | ICD-10-CM | POA: Insufficient documentation

## 2016-05-19 DIAGNOSIS — I2582 Chronic total occlusion of coronary artery: Secondary | ICD-10-CM | POA: Insufficient documentation

## 2016-05-19 DIAGNOSIS — Z86010 Personal history of colonic polyps: Secondary | ICD-10-CM | POA: Diagnosis not present

## 2016-05-19 DIAGNOSIS — R031 Nonspecific low blood-pressure reading: Secondary | ICD-10-CM

## 2016-05-19 DIAGNOSIS — Z950 Presence of cardiac pacemaker: Secondary | ICD-10-CM | POA: Insufficient documentation

## 2016-05-19 DIAGNOSIS — Z880 Allergy status to penicillin: Secondary | ICD-10-CM | POA: Diagnosis not present

## 2016-05-19 DIAGNOSIS — E1151 Type 2 diabetes mellitus with diabetic peripheral angiopathy without gangrene: Secondary | ICD-10-CM | POA: Diagnosis not present

## 2016-05-19 DIAGNOSIS — D649 Anemia, unspecified: Secondary | ICD-10-CM

## 2016-05-19 DIAGNOSIS — Z882 Allergy status to sulfonamides status: Secondary | ICD-10-CM | POA: Insufficient documentation

## 2016-05-19 DIAGNOSIS — Z87891 Personal history of nicotine dependence: Secondary | ICD-10-CM | POA: Insufficient documentation

## 2016-05-19 DIAGNOSIS — E785 Hyperlipidemia, unspecified: Secondary | ICD-10-CM | POA: Diagnosis not present

## 2016-05-19 DIAGNOSIS — H9190 Unspecified hearing loss, unspecified ear: Secondary | ICD-10-CM | POA: Insufficient documentation

## 2016-05-19 DIAGNOSIS — E782 Mixed hyperlipidemia: Secondary | ICD-10-CM | POA: Diagnosis present

## 2016-05-19 DIAGNOSIS — I200 Unstable angina: Secondary | ICD-10-CM | POA: Diagnosis present

## 2016-05-19 DIAGNOSIS — Z7902 Long term (current) use of antithrombotics/antiplatelets: Secondary | ICD-10-CM | POA: Insufficient documentation

## 2016-05-19 DIAGNOSIS — I25810 Atherosclerosis of coronary artery bypass graft(s) without angina pectoris: Secondary | ICD-10-CM | POA: Insufficient documentation

## 2016-05-19 DIAGNOSIS — Z7984 Long term (current) use of oral hypoglycemic drugs: Secondary | ICD-10-CM | POA: Insufficient documentation

## 2016-05-19 DIAGNOSIS — I25710 Atherosclerosis of autologous vein coronary artery bypass graft(s) with unstable angina pectoris: Secondary | ICD-10-CM | POA: Insufficient documentation

## 2016-05-19 DIAGNOSIS — I350 Nonrheumatic aortic (valve) stenosis: Secondary | ICD-10-CM | POA: Insufficient documentation

## 2016-05-19 DIAGNOSIS — E039 Hypothyroidism, unspecified: Secondary | ICD-10-CM | POA: Insufficient documentation

## 2016-05-19 DIAGNOSIS — K219 Gastro-esophageal reflux disease without esophagitis: Secondary | ICD-10-CM | POA: Insufficient documentation

## 2016-05-19 DIAGNOSIS — I10 Essential (primary) hypertension: Secondary | ICD-10-CM | POA: Insufficient documentation

## 2016-05-19 DIAGNOSIS — Z955 Presence of coronary angioplasty implant and graft: Secondary | ICD-10-CM | POA: Insufficient documentation

## 2016-05-19 DIAGNOSIS — Z8249 Family history of ischemic heart disease and other diseases of the circulatory system: Secondary | ICD-10-CM | POA: Diagnosis not present

## 2016-05-19 DIAGNOSIS — E119 Type 2 diabetes mellitus without complications: Secondary | ICD-10-CM

## 2016-05-19 DIAGNOSIS — I25110 Atherosclerotic heart disease of native coronary artery with unstable angina pectoris: Secondary | ICD-10-CM | POA: Diagnosis present

## 2016-05-19 HISTORY — DX: Type 2 diabetes mellitus without complications: E11.9

## 2016-05-19 HISTORY — DX: Personal history of other medical treatment: Z92.89

## 2016-05-19 HISTORY — DX: Ischemic cardiomyopathy: I25.5

## 2016-05-19 HISTORY — PX: CORONARY ANGIOPLASTY WITH STENT PLACEMENT: SHX49

## 2016-05-19 HISTORY — DX: Unspecified osteoarthritis, unspecified site: M19.90

## 2016-05-19 HISTORY — DX: Presence of cardiac pacemaker: Z95.0

## 2016-05-19 HISTORY — PX: CARDIAC CATHETERIZATION: SHX172

## 2016-05-19 LAB — GLUCOSE, CAPILLARY
GLUCOSE-CAPILLARY: 194 mg/dL — AB (ref 65–99)
Glucose-Capillary: 209 mg/dL — ABNORMAL HIGH (ref 65–99)
Glucose-Capillary: 145 mg/dL — ABNORMAL HIGH (ref 65–99)
Glucose-Capillary: 144 mg/dL — ABNORMAL HIGH (ref 65–99)

## 2016-05-19 LAB — CBC
HCT: 36.6 % (ref 36.0–46.0)
Hemoglobin: 12.1 g/dL (ref 12.0–15.0)
MCH: 28 pg (ref 26.0–34.0)
MCHC: 33.1 g/dL (ref 30.0–36.0)
MCV: 84.7 fL (ref 78.0–100.0)
PLATELETS: 238 10*3/uL (ref 150–400)
RBC: 4.32 MIL/uL (ref 3.87–5.11)
RDW: 13.1 % (ref 11.5–15.5)
WBC: 5.5 10*3/uL (ref 4.0–10.5)

## 2016-05-19 LAB — CREATININE, SERUM
CREATININE: 0.71 mg/dL (ref 0.44–1.00)
GFR calc Af Amer: >60 mL/min (ref >60)
GFR calc non Af Amer: >60 mL/min (ref >60)

## 2016-05-19 LAB — POCT ACTIVATED CLOTTING TIME: ACTIVATED CLOTTING TIME: 318 s

## 2016-05-19 SURGERY — LEFT HEART CATH AND CORS/GRAFTS ANGIOGRAPHY

## 2016-05-19 MED ORDER — SODIUM CHLORIDE 0.9 % IV SOLN: 250.0000 mL | INTRAVENOUS | Status: DC | PRN

## 2016-05-19 MED ORDER — SODIUM CHLORIDE 0.9 % WEIGHT BASED INFUSION: 1.0000 mL/kg/h | INTRAVENOUS | Status: DC

## 2016-05-19 MED ORDER — HEPARIN SODIUM (PORCINE) 5000 UNIT/ML IJ SOLN: 5000.0000 [IU] | Freq: Three times a day (TID) | INTRAMUSCULAR | Status: DC

## 2016-05-19 MED ORDER — TRAMADOL HCL 50 MG PO TABS: 100.0000 mg | ORAL_TABLET | Freq: Four times a day (QID) | ORAL | Status: DC | PRN

## 2016-05-19 MED ORDER — IOPAMIDOL (ISOVUE-370) INJECTION 76%
INTRAVENOUS | Status: AC
  Filled 2016-05-19: qty 125

## 2016-05-19 MED ORDER — ATORVASTATIN CALCIUM 80 MG PO TABS
80.0000 mg | ORAL_TABLET | Freq: Every day | ORAL | Status: DC
  Administered 2016-05-19: 18:00:00 80 mg via ORAL
  Filled 2016-05-19: qty 1

## 2016-05-19 MED ORDER — METOPROLOL TARTRATE 25 MG PO TABS
25.0000 mg | ORAL_TABLET | Freq: Two times a day (BID) | ORAL | Status: DC
  Administered 2016-05-19 – 2016-05-20 (×2): 25 mg via ORAL
  Filled 2016-05-19 (×2): qty 1

## 2016-05-19 MED ORDER — LIDOCAINE HCL (PF) 1 % IJ SOLN
INTRAMUSCULAR | Status: DC | PRN
  Administered 2016-05-19: 20 mL

## 2016-05-19 MED ORDER — NITROGLYCERIN 1 MG/10 ML FOR IR/CATH LAB
INTRA_ARTERIAL | Status: DC | PRN
  Administered 2016-05-19: 200 ug via INTRACORONARY

## 2016-05-19 MED ORDER — BIVALIRUDIN 250 MG IV SOLR
250.0000 mg | INTRAVENOUS | Status: DC | PRN
  Administered 2016-05-19: 1.75 mg/kg/h via INTRAVENOUS

## 2016-05-19 MED ORDER — CLOPIDOGREL BISULFATE 75 MG PO TABS
75.0000 mg | ORAL_TABLET | Freq: Every day | ORAL | Status: DC
  Administered 2016-05-20: 75 mg via ORAL
  Filled 2016-05-19: qty 1

## 2016-05-19 MED ORDER — TRAZODONE HCL 50 MG PO TABS: 50.0000 mg | ORAL_TABLET | Freq: Every evening | ORAL | Status: DC | PRN

## 2016-05-19 MED ORDER — ASPIRIN 81 MG PO CHEW
81.0000 mg | CHEWABLE_TABLET | Freq: Every day | ORAL | Status: DC
  Administered 2016-05-20: 10:00:00 81 mg via ORAL
  Filled 2016-05-19: qty 1

## 2016-05-19 MED ORDER — HEPARIN (PORCINE) IN NACL 2-0.9 UNIT/ML-% IJ SOLN
INTRAMUSCULAR | Status: DC | PRN
  Administered 2016-05-19: 1500 mL

## 2016-05-19 MED ORDER — LEVOTHYROXINE SODIUM 125 MCG PO TABS
125.0000 ug | ORAL_TABLET | Freq: Every day | ORAL | Status: DC
  Filled 2016-05-19: qty 1

## 2016-05-19 MED ORDER — FENTANYL CITRATE (PF) 100 MCG/2ML IJ SOLN
INTRAMUSCULAR | Status: DC | PRN
  Administered 2016-05-19 (×2): 50 ug via INTRAVENOUS

## 2016-05-19 MED ORDER — CLOPIDOGREL BISULFATE 75 MG PO TABS: 75.0000 mg | ORAL_TABLET | Freq: Every day | ORAL | Status: DC

## 2016-05-19 MED ORDER — IOPAMIDOL (ISOVUE-370) INJECTION 76%
INTRAVENOUS | Status: AC
  Filled 2016-05-19: qty 50

## 2016-05-19 MED ORDER — INSULIN ASPART 100 UNIT/ML ~~LOC~~ SOLN
0.0000 [IU] | Freq: Three times a day (TID) | SUBCUTANEOUS | Status: DC
  Administered 2016-05-19: 2 [IU] via SUBCUTANEOUS
  Administered 2016-05-20 (×2): 3 [IU] via SUBCUTANEOUS

## 2016-05-19 MED ORDER — HEPARIN (PORCINE) IN NACL 2-0.9 UNIT/ML-% IJ SOLN
INTRAMUSCULAR | Status: AC
  Filled 2016-05-19: qty 500

## 2016-05-19 MED ORDER — CLOPIDOGREL BISULFATE 300 MG PO TABS
ORAL_TABLET | ORAL | Status: AC
  Filled 2016-05-19: qty 1

## 2016-05-19 MED ORDER — AMLODIPINE BESYLATE 5 MG PO TABS
5.0000 mg | ORAL_TABLET | Freq: Every day | ORAL | Status: DC
  Administered 2016-05-19 – 2016-05-20 (×2): 5 mg via ORAL
  Filled 2016-05-19 (×2): qty 1

## 2016-05-19 MED ORDER — LIDOCAINE HCL (PF) 1 % IJ SOLN
INTRAMUSCULAR | Status: AC
  Filled 2016-05-19: qty 30

## 2016-05-19 MED ORDER — ISOSORBIDE MONONITRATE ER 60 MG PO TB24
120.0000 mg | ORAL_TABLET | Freq: Every day | ORAL | Status: DC
  Administered 2016-05-19 – 2016-05-20 (×2): 120 mg via ORAL
  Filled 2016-05-19 (×2): qty 2

## 2016-05-19 MED ORDER — METFORMIN HCL 500 MG PO TABS: 500.0000 mg | ORAL_TABLET | Freq: Every day | ORAL | Status: DC

## 2016-05-19 MED ORDER — MIDAZOLAM HCL 2 MG/2ML IJ SOLN
INTRAMUSCULAR | Status: AC
  Filled 2016-05-19: qty 2

## 2016-05-19 MED ORDER — ASPIRIN 81 MG PO CHEW: 81.0000 mg | CHEWABLE_TABLET | ORAL | Status: DC

## 2016-05-19 MED ORDER — NITROGLYCERIN 1 MG/10 ML FOR IR/CATH LAB
INTRA_ARTERIAL | Status: AC
  Filled 2016-05-19: qty 10

## 2016-05-19 MED ORDER — BIVALIRUDIN 250 MG IV SOLR
INTRAVENOUS | Status: AC
  Filled 2016-05-19: qty 250

## 2016-05-19 MED ORDER — DULOXETINE HCL 60 MG PO CPEP: 60.0000 mg | ORAL_CAPSULE | Freq: Every day | ORAL | Status: DC

## 2016-05-19 MED ORDER — NITROGLYCERIN IN D5W 200-5 MCG/ML-% IV SOLN: 20.0000 ug/min | INTRAVENOUS | Status: DC

## 2016-05-19 MED ORDER — PANTOPRAZOLE SODIUM 40 MG PO TBEC
40.0000 mg | DELAYED_RELEASE_TABLET | Freq: Two times a day (BID) | ORAL | Status: DC
  Administered 2016-05-19 – 2016-05-20 (×3): 40 mg via ORAL
  Filled 2016-05-19 (×3): qty 1

## 2016-05-19 MED ORDER — MIDAZOLAM HCL 2 MG/2ML IJ SOLN
INTRAMUSCULAR | Status: DC | PRN
  Administered 2016-05-19 (×2): 1 mg via INTRAVENOUS

## 2016-05-19 MED ORDER — TICAGRELOR 90 MG PO TABS: 90.0000 mg | ORAL_TABLET | Freq: Two times a day (BID) | ORAL | Status: DC

## 2016-05-19 MED ORDER — SODIUM CHLORIDE 0.9% FLUSH: 3.0000 mL | Freq: Two times a day (BID) | INTRAVENOUS | Status: DC

## 2016-05-19 MED ORDER — TRAMADOL HCL 50 MG PO TABS: 50.0000 mg | ORAL_TABLET | Freq: Four times a day (QID) | ORAL | Status: DC | PRN

## 2016-05-19 MED ORDER — GABAPENTIN 100 MG PO CAPS
100.0000 mg | ORAL_CAPSULE | Freq: Three times a day (TID) | ORAL | Status: DC
  Administered 2016-05-19 – 2016-05-20 (×3): 100 mg via ORAL
  Filled 2016-05-19 (×3): qty 1

## 2016-05-19 MED ORDER — NITROGLYCERIN IN D5W 200-5 MCG/ML-% IV SOLN
INTRAVENOUS | Status: AC
  Filled 2016-05-19: qty 250

## 2016-05-19 MED ORDER — ACETAMINOPHEN 325 MG PO TABS
650.0000 mg | ORAL_TABLET | ORAL | Status: DC | PRN
  Administered 2016-05-20: 650 mg via ORAL
  Filled 2016-05-19: qty 2

## 2016-05-19 MED ORDER — SODIUM CHLORIDE 0.9 % IV SOLN
INTRAVENOUS | Status: DC | PRN
  Administered 2016-05-19: 200 mL/h via INTRAVENOUS

## 2016-05-19 MED ORDER — NITROGLYCERIN IN D5W 200-5 MCG/ML-% IV SOLN
INTRAVENOUS | Status: DC | PRN
  Administered 2016-05-19: 10 ug/min via INTRAVENOUS

## 2016-05-19 MED ORDER — ANGIOPLASTY BOOK
Freq: Once | Status: AC
  Administered 2016-05-19: 23:00:00
  Filled 2016-05-19: qty 1

## 2016-05-19 MED ORDER — CLOPIDOGREL BISULFATE 300 MG PO TABS
ORAL_TABLET | ORAL | Status: DC | PRN
  Administered 2016-05-19: 300 mg via ORAL

## 2016-05-19 MED ORDER — TRAMADOL HCL 50 MG PO TABS
50.0000 mg | ORAL_TABLET | Freq: Four times a day (QID) | ORAL | Status: DC | PRN
  Administered 2016-05-19: 50 mg via ORAL
  Filled 2016-05-19: qty 1

## 2016-05-19 MED ORDER — NITROGLYCERIN 0.4 MG SL SUBL
0.4000 mg | SUBLINGUAL_TABLET | SUBLINGUAL | Status: DC | PRN
Start: 2016-05-19 — End: 2016-05-20

## 2016-05-19 MED ORDER — SODIUM CHLORIDE 0.9 % WEIGHT BASED INFUSION
3.0000 mL/kg/h | INTRAVENOUS | Status: DC
  Administered 2016-05-19: 3 mL/kg/h via INTRAVENOUS

## 2016-05-19 MED ORDER — IOPAMIDOL (ISOVUE-370) INJECTION 76%
INTRAVENOUS | Status: AC
  Filled 2016-05-19: qty 100

## 2016-05-19 MED ORDER — ONDANSETRON HCL 4 MG/2ML IJ SOLN: 4.0000 mg | Freq: Four times a day (QID) | INTRAMUSCULAR | Status: DC | PRN

## 2016-05-19 MED ORDER — SODIUM CHLORIDE 0.9 % WEIGHT BASED INFUSION
1.0000 mL/kg/h | INTRAVENOUS | Status: DC
Start: 2016-05-19 — End: 2016-05-19

## 2016-05-19 MED ORDER — OXYCODONE-ACETAMINOPHEN 5-325 MG PO TABS: 1.0000 | ORAL_TABLET | ORAL | Status: DC | PRN

## 2016-05-19 MED ORDER — SODIUM CHLORIDE 0.9% FLUSH: 3.0000 mL | INTRAVENOUS | Status: DC | PRN

## 2016-05-19 MED ORDER — LOSARTAN POTASSIUM 50 MG PO TABS
50.0000 mg | ORAL_TABLET | Freq: Every day | ORAL | Status: DC
  Administered 2016-05-19 – 2016-05-20 (×2): 50 mg via ORAL
  Filled 2016-05-19 (×2): qty 1

## 2016-05-19 MED ORDER — IOPAMIDOL (ISOVUE-370) INJECTION 76%
INTRAVENOUS | Status: DC | PRN
  Administered 2016-05-19: 295 mL via INTRAVENOUS

## 2016-05-19 MED ORDER — INSULIN ASPART 100 UNIT/ML ~~LOC~~ SOLN: 0.0000 [IU] | Freq: Every day | SUBCUTANEOUS | Status: DC

## 2016-05-19 MED ORDER — TRAZODONE HCL 50 MG PO TABS
100.0000 mg | ORAL_TABLET | Freq: Every evening | ORAL | Status: DC | PRN
  Administered 2016-05-19: 100 mg via ORAL
  Filled 2016-05-19: qty 2

## 2016-05-19 MED ORDER — SODIUM CHLORIDE 0.9% FLUSH
3.0000 mL | Freq: Two times a day (BID) | INTRAVENOUS | Status: DC
  Administered 2016-05-19 – 2016-05-20 (×2): 3 mL via INTRAVENOUS

## 2016-05-19 MED ORDER — GLIMEPIRIDE 4 MG PO TABS
2.0000 mg | ORAL_TABLET | Freq: Every day | ORAL | Status: DC
  Administered 2016-05-20: 06:00:00 2 mg via ORAL
  Filled 2016-05-19: qty 1

## 2016-05-19 MED ORDER — BIVALIRUDIN BOLUS VIA INFUSION - CUPID
INTRAVENOUS | Status: DC | PRN
Start: 2016-05-19 — End: 2016-05-19
  Administered 2016-05-19: 44.25 mg via INTRAVENOUS

## 2016-05-19 MED ORDER — ATORVASTATIN CALCIUM 80 MG PO TABS: 80.0000 mg | ORAL_TABLET | Freq: Every day | ORAL | Status: DC

## 2016-05-19 MED ORDER — FENTANYL CITRATE (PF) 100 MCG/2ML IJ SOLN
INTRAMUSCULAR | Status: AC
  Filled 2016-05-19: qty 2

## 2016-05-19 SURGICAL SUPPLY — 20 items
BALLN EUPHORA RX 2.25X15 (BALLOONS) ×3
BALLN ~~LOC~~ EUPHORA RX 2.5X8 (BALLOONS) ×3
BALLOON EUPHORA RX 2.25X15 (BALLOONS) IMPLANT
BALLOON ~~LOC~~ EUPHORA RX 2.5X8 (BALLOONS) IMPLANT
CATH EXPO 5F MPA-1 (CATHETERS) ×2 IMPLANT
CATH INFINITI 5 FR IM (CATHETERS) ×2 IMPLANT
CATH INFINITI 5FR MULTPACK ANG (CATHETERS) ×2 IMPLANT
DEVICE WIRE ANGIOSEAL 6FR (Vascular Products) ×2 IMPLANT
GUIDE CATH RUNWAY 6FR MP1 (CATHETERS) ×2 IMPLANT
KIT ENCORE 26 ADVANTAGE (KITS) ×2 IMPLANT
KIT HEART LEFT (KITS) ×3 IMPLANT
PACK CARDIAC CATHETERIZATION (CUSTOM PROCEDURE TRAY) ×3 IMPLANT
SHEATH PINNACLE 5F 10CM (SHEATH) ×2 IMPLANT
SHEATH PINNACLE 6F 10CM (SHEATH) ×2 IMPLANT
STENT PROMUS PREM MR 2.25X16 (Permanent Stent) ×2 IMPLANT
STENT PROMUS PREM MR 2.25X8 (Permanent Stent) ×2 IMPLANT
TRANSDUCER W/STOPCOCK (MISCELLANEOUS) ×3 IMPLANT
TUBING CIL FLEX 10 FLL-RA (TUBING) ×3 IMPLANT
WIRE ASAHI PROWATER 180CM (WIRE) ×8 IMPLANT
WIRE EMERALD 3MM-J .035X150CM (WIRE) ×2 IMPLANT

## 2016-05-19 NOTE — H&P (View-Only) (Signed)
Cardiology Office Note Date:  05/17/2016   ID:  LARENDA Steele, DOB 01-29-1929, MRN [id number]  PCP:  Binnie Rail, MD  Cardiologist:  Sherren Mocha, MD    Chief Complaint  Patient presents with  . Aortic Stenosis  . hypertension pulmonary  . Coronary Artery Disease  . Chest Pain     History of Present Illness: Janet Steele is a 82 y.o. female who presents for follow-up evaluation. She was last seen 03/05/2016. The patient is followed for coronary artery disease and chronic angina. She has extensive CAD with initial CABG in 1982 and redo CABG in 2003. Other medical problems include hyperlipidemia, type 2 diabetes, and symptomatic bradycardia status post permanent pacemaker placement. She has been seen frequently for chronic angina. Earlier this year she had worsening angina and amlodipine was increased. This helped her symptoms and when I saw her last in March she was doing well. Unfortunately she developed recurrent symptoms of sharp chest discomfort radiating to her left arm. These have been her typical anginal symptoms. Symptoms are worse over the last 2 weeks. In the last 24 hours has taken 6 nitroglycerin with relief of her chest pain each time. Her regimen includes amlodipine, high-dose isosorbide, and Ranexa. She does not tolerate the Ranexa well and she only takes it intermittently because of dizziness. Her last heart catheterization in 2015 is reviewed today with the results outlined below.  The patient's last episode of chest discomfort was early this morning. She's had no recurrence later in the morning or this afternoon. She complains of associated shortness of breath. There is no lightheadedness, syncope, or heart palpitations. She has a long list of drug allergies. The patient is here with her husband today.  Past Medical History  Diagnosis Date  . Hypothyroidism   . Hyperlipidemia   . GERD (gastroesophageal reflux disease)   . Anxiety   . Diabetes mellitus   .  Hypertension   . PVD (peripheral vascular disease) ([place])   . Carotid bruit   . Colon, diverticulosis   . Hiatal hernia   . Osteoarthrosis, unspecified whether generalized or localized, unspecified site   . Degenerative joint disease   . Esophageal stricture   . Unspecified hearing loss   . Diastolic dysfunction   . CAD (coronary artery disease)     a. s/p CABG 1982 b. redo CABG 2003  . Colon polyp     adenomatous  . Anemia     Past Surgical History  Procedure Laterality Date  . Appendectomy    . Cholecystectomy    . Abdominal hysterectomy    . Oophorectomy      bilateral  . Coronary artery bypass graft      x4(1982),02-2002 CABG X2  . Back surgery  01-2000  . Permanent pacemaker insertion N/A 07/06/2012    MDT Adapta L implanted by Dr Rayann Heman for symptomatic bradycardia  . Left heart catheterization with coronary/graft angiogram N/A 03/05/2014    Procedure: LEFT HEART CATHETERIZATION WITH Beatrix Fetters;  Surgeon: Sinclair Grooms, MD;  Location[facility] C[facility] LAB;  Service: Cardiovascular;  Laterality: N/A;  . Colonoscopy      Current Outpatient Prescriptions  Medication Sig Dispense Refill  . amLODipine (NORVASC) 5 MG tablet Take 1 tablet (5 mg total) by mouth daily.    Marland Kitchen aspirin 325 MG EC tablet Take 325 mg by mouth daily.      Marland Kitchen atorvastatin (LIPITOR) 80 MG tablet take 1 tablet by mouth once daily AT 6  PM 30 tablet 3  . diazepam (VALIUM) 2 MG tablet take 1 tablet by mouth every 6 hours if needed for anxiety 30 tablet 3  . DULoxetine (CYMBALTA) 60 MG capsule Take 1 capsule (60 mg total) by mouth daily. 90 capsule 1  . gabapentin (NEURONTIN) 100 MG capsule Take 100 mg by mouth 3 (three) times daily.    Marland Kitchen glimepiride (AMARYL) 2 MG tablet Take 2 mg by mouth daily before breakfast.     . isosorbide mononitrate (IMDUR) 120 MG 24 hr tablet take 1 tablet by mouth once daily 30 tablet 5  . levothyroxine (SYNTHROID, LEVOTHROID) 125 MCG tablet Take 1 tablet (125 mcg total) by  mouth daily. 90 tablet 1  . losartan (COZAAR) 50 MG tablet Take 1 tablet (50 mg total) by mouth daily. 30 tablet 5  . metFORMIN (GLUCOPHAGE) 500 MG tablet Take 1 tablet (500 mg total) by mouth daily with breakfast. 90 tablet 3  . nitroGLYCERIN (NITROSTAT) 0.4 MG SL tablet Place 1 tablet (0.4 mg total) under the tongue every 5 (five) minutes as needed. For chest pain 25 tablet 5  . oxyCODONE-acetaminophen (PERCOCET/ROXICET) 5-325 MG tablet Take 1 tablet by mouth every 4 (four) hours as needed for moderate pain. Take 1 tablet by mouth every 4-6 hours as needed for pain  0  . pantoprazole (PROTONIX) 40 MG tablet Take 1 tablet (40 mg total) by mouth 2 (two) times daily. 30 tablet 6  . ranitidine (ZANTAC) 150 MG tablet Take 1 tablet (150 mg total) by mouth at bedtime. 30 tablet 3  . traMADol (ULTRAM) 50 MG tablet Take 50 mg by mouth every 6 (six) hours as needed for moderate pain (back pain).   0  . traZODone (DESYREL) 50 MG tablet Take 1 tablet (50 mg total) by mouth at bedtime as needed for sleep. 145 tablet 1  . clopidogrel (PLAVIX) 75 MG tablet Take 1 tablet (75 mg total) by mouth daily. 30 tablet 6  . metoprolol tartrate (LOPRESSOR) 25 MG tablet Take 1 tablet (25 mg total) by mouth 2 (two) times daily. 60 tablet 6   No current facility-administered medications for this visit.    Allergies:   Morphine; Penicillins; Prednisone; Sulfonamide derivatives; Codeine; Hydrocodone-acetaminophen; Meperidine hcl; Metoclopramide hcl; Metoprolol succinate; Moxifloxacin; Oxycodone-aspirin; Pentazocine lactate; and Pioglitazone   Social History:  The patient  reports that she quit smoking about 39 years ago. Her smoking use included Cigarettes. She has never used smokeless tobacco. She reports that she drinks alcohol. She reports that she does not use illicit drugs.   Family History:  The patient's family history includes Heart attack in her sister; Heart disease in her sister. There is no history of Colon  cancer, Breast cancer, Celiac disease, Cirrhosis, Clotting disorder, Colitis, Colon polyps, Crohn's disease, Cystic fibrosis, Diabetes, Esophageal cancer, Hemochromatosis, Inflammatory bowel disease, Irritable bowel syndrome, Kidney disease, Liver cancer, Liver disease, Ovarian cancer, Pancreatic cancer, Prostate cancer, Rectal cancer, Stomach cancer, Ulcerative colitis, Uterine cancer, or Wilson's disease.    ROS:  Please see the history of present illness.  Otherwise, review of systems is positive for tremor, fatigue.  All other systems are reviewed and negative.    PHYSICAL EXAM: VS:  BP 118/60 mmHg  Pulse 84  Ht 5' (1.524 m)  Wt 130 lb (58.968 kg)  BMI 25.39 kg/m2 , BMI Body mass index is 25.39 kg/(m^2). GEN: Well nourished, well developed, elderly woman in no acute distress HEENT: normal Neck: no JVD, no masses. bilateral carotid bruits Cardiac:  RRR with 2/6 SEM at the RUSB, no diastolic murmur               Respiratory:  clear to auscultation bilaterally, normal work of breathing GI: soft, nontender, nondistended, + BS MS: no deformity or atrophy Ext: no pretibial edema Skin: warm and dry, no rash Neuro:  Strength and sensation are intact Psych: euthymic mood, full affect  EKG:  EKG is ordered today. The ekg ordered today shows electronic atrial pacemaker 84 bpm, age indeterminate inferior infarct, T-wave abnormality consider lateral ischemia  Recent Labs: 12/23/2015: ALT 18; BUN 16; Creat 0.97*; Potassium 5.0; Sodium 137 04/05/2016: Hemoglobin 14.5; Platelets 321.0 04/30/2016: TSH 4.89*   Lipid Panel     Component Value Date/Time   CHOL 212* 05/15/2014 0905   TRIG 182.0* 05/15/2014 0905   HDL 32.80* 05/15/2014 0905   CHOLHDL 6 05/15/2014 0905   VLDL 36.4 05/15/2014 0905   LDLCALC 143* 05/15/2014 0905   LDLDIRECT 98.6 02/23/2010 1255      Wt Readings from Last 3 Encounters:  05/17/16 130 lb (58.968 kg)  04/05/16 131 lb (59.421 kg)  03/05/16 134 lb 6.4 oz (60.963 kg)       Cardiac Studies Reviewed: Cardiac Cath 03-05-2014: INDICATIONS: Acute coronary syndrome in this 82 year old with history of coronary artery bypass grafting 1982 and 2002  PROCEDURE: 1. Left heart catheterization; 2. Coronary angiography; 3. Left ventriculography; 4. Saphenous vein graft angiography; 5. Internal mammary artery angiography  CONSENT:  The risks, benefits, and details of the procedure were explained in detail to the patient. Risks including death, stroke, heart attack, kidney injury, allergy, limb ischemia, bleeding and radiation injury were discussed. The patient verbalized understanding and wanted to proceed. Informed written consent was obtained.  PROCEDURE TECHNIQUE: After Xylocaine anesthesia a 5 French sheath was placed in the right femoral artery with a single anterior needle wall stick. Coronary angiography was done using a 5 French A2 MP, left bypass graft catheter, 4 cm left Judkins catheter, and a 5 [place] internal mammary artery catheters. Left ventriculography was done using the 5 French A2 multipurpose catheter and hand injection.   The arterial sheath was removed and hemostasis achieved by manual compression.  MEDICATIONS: Versed 1 mg IV and fentanyl 50 mcg IV   CONTRAST: Total of 145 cc.  COMPLICATIONS: None   HEMODYNAMICS: Aortic pressure 87/47 mmHg; LV pressure 87/0 mmHg; LVEDP 8 mm mercury  ANGIOGRAPHIC DATA: The left main coronary artery is patent.  The left anterior descending artery is totally occluded in the mid vessel and 99% in the proximal vessel. The proximal lesion is new. The vascular territory is small.  The left circumflex artery is totally occluded as home prior angiogram in 2002.  The right coronary artery is totally occluded.Marland Kitchen  BYPASS GRAFT ANGIOGRAPHY:   The saphenous vein graft to the diagonal/LAD is totally occluded.  The saphenous vein graft to the circumflex is totally occluded.  The saphenous vein graft to the  PDA is widely patent. There is diffuse disease in the connection between the PDA and 2 left ventricular branches. There is also moderate diffuse disease in the PDA beyond the graft insertion site. No high-grade focal stenoses are noted. Progression of the disease in the segment between the PDA and left ventricular branches is noted.  LIMA to the LAD is widely patent with a large diagonal also filling. The LAD supplies collaterals to the circumflex.   PCI RESULTS: Not performed  LEFT VENTRICULOGRAM: Left ventricular angiogram was done in the  30 RAO projection and revealed severe inferobasal and mid inferior wall hypokinesis with an ejection fraction of 35-40% no significant mitral regurgitation is identified.   IMPRESSIONS: 1. Total occlusion of LAD, circumflex, and RCA. The LAD occlusion is mid vessel and there is a new 99% proximal stenosis. The vascular territory supplied between the 2 stenoses in the LAD is relatively small. This may be a source of some of the patient's angina.  2. Total occlusion of the saphenous vein graft to the circumflex; total occlusion of the saphenous vein graft to the diagonal/LAD; and widely patent saphenous vein graft to the PDA.  3. The saphenous vein graft to the PDA as mentioned above is widely patent and there is progression of disease in the continuation of the right coronary proximal to the origin of 2 left ventricular branches (the continuation LV branches fill retrograde off the PDA proximal to the graft insertion site).  4. Patent LIMA to the LAD   RECOMMENDATION: Continue aggressive medical therapy.  ASSESSMENT AND PLAN: CAD, native vessel and bypass graft disease, with accelerating angina. The patient presents today with crescendo angina, relieved with nitroglycerin and rest. She has had symptoms with minimal activity. We discussed options of hospital admission for IV heparin versus outpatient catheterization as soon as possible. I have reviewed her  medical program at length today. Metoprolol succinate is listed as an allergy but the reaction is "nervousness." I am going to try her on metoprolol tartrate 25 mg twice daily. She should continue on amlodipine, Ranexa, and high-dose isosorbide. The patient will be scheduled for cardiac catheterization at the next available time. She understands that if symptoms progress or she has nitroglycerin refractory chest pain, she needs to call 911 immediately. Her husband is here with her today and also understands the instructions clearly. I am going to start her on Plavix 300 mg today and 75 mg daily starting tomorrow.  I have reviewed the risks, indications, and alternatives to cardiac catheterization, possible angioplasty, and stenting with the patient. Risks include but are not limited to bleeding, infection, vascular injury, stroke, myocardial infection, arrhythmia, kidney injury, radiation-related injury in the case of prolonged fluoroscopy use, emergency cardiac surgery, and death. The patient understands the risks of serious complication is 1-2 in [id number] with diagnostic cardiac cath and 1-2% or less with angioplasty/stenting.   Last cath study reviewed and she has 2 patent grafts at that time. Hopefully her anatomy will be conducive to PCI as there is no room to further titrate her anti-anginal program. She understands options may be limited.   2. Essential HTN: controlled  3. Hyperlipidemia: treated with high-dose atorvastatin  Current medicines are reviewed with the patient today.  The patient does not have concerns regarding medicines.  Labs/ tests ordered today include:   Orders Placed This Encounter  Procedures  . Basic Metabolic Panel (BMET)  . CBC  . INR/PT   Disposition:   FU pending cardiac cath results  Signed, Sherren Mocha, MD  05/17/2016 6:44 PM    [address], [place], [place]  [address] Phone: [phone]; Fax: [phone]

## 2016-05-19 NOTE — Interval H&P Note (Signed)
Cath Lab Visit (complete for each Cath Lab visit)  Clinical Evaluation Leading to the Procedure:   ACS: Yes.    Non-ACS:    Anginal Classification: CCS III  Anti-ischemic medical therapy: Maximal Therapy (2 or more classes of medications)  Non-Invasive Test Results: No non-invasive testing performed  Prior CABG: Previous CABG      History and Physical Interval Note:  05/19/2016 8:00 AM  Janet Steele  has presented today for surgery, with the diagnosis of cp  The various methods of treatment have been discussed with the patient and family. After consideration of risks, benefits and other options for treatment, the patient has consented to  Procedure(s): Left Heart Cath and Cors/Grafts Angiography (N/A) as a surgical intervention .  The patient's history has been reviewed, patient examined, no change in status, stable for surgery.  I have reviewed the patient's chart and labs.  Questions were answered to the patient's satisfaction.     Belva Crome III

## 2016-05-20 ENCOUNTER — Encounter (HOSPITAL_COMMUNITY): Payer: Self-pay | Admitting: Interventional Cardiology

## 2016-05-20 DIAGNOSIS — I2582 Chronic total occlusion of coronary artery: Secondary | ICD-10-CM | POA: Diagnosis not present

## 2016-05-20 DIAGNOSIS — E1151 Type 2 diabetes mellitus with diabetic peripheral angiopathy without gangrene: Secondary | ICD-10-CM | POA: Diagnosis not present

## 2016-05-20 DIAGNOSIS — I10 Essential (primary) hypertension: Secondary | ICD-10-CM

## 2016-05-20 DIAGNOSIS — E785 Hyperlipidemia, unspecified: Secondary | ICD-10-CM | POA: Diagnosis not present

## 2016-05-20 DIAGNOSIS — I25810 Atherosclerosis of coronary artery bypass graft(s) without angina pectoris: Secondary | ICD-10-CM

## 2016-05-20 DIAGNOSIS — R031 Nonspecific low blood-pressure reading: Secondary | ICD-10-CM

## 2016-05-20 DIAGNOSIS — E782 Mixed hyperlipidemia: Secondary | ICD-10-CM | POA: Diagnosis not present

## 2016-05-20 DIAGNOSIS — D649 Anemia, unspecified: Secondary | ICD-10-CM

## 2016-05-20 DIAGNOSIS — I739 Peripheral vascular disease, unspecified: Secondary | ICD-10-CM

## 2016-05-20 DIAGNOSIS — I25710 Atherosclerosis of autologous vein coronary artery bypass graft(s) with unstable angina pectoris: Secondary | ICD-10-CM | POA: Diagnosis not present

## 2016-05-20 DIAGNOSIS — E039 Hypothyroidism, unspecified: Secondary | ICD-10-CM | POA: Diagnosis not present

## 2016-05-20 DIAGNOSIS — I200 Unstable angina: Secondary | ICD-10-CM

## 2016-05-20 LAB — GLUCOSE, CAPILLARY
Glucose-Capillary: 170 mg/dL — ABNORMAL HIGH (ref 65–99)
Glucose-Capillary: 188 mg/dL — ABNORMAL HIGH (ref 65–99)

## 2016-05-20 LAB — BASIC METABOLIC PANEL
Anion gap: 10 (ref 5–15)
BUN: 12 mg/dL (ref 6–20)
CALCIUM: 8.5 mg/dL — AB (ref 8.9–10.3)
CO2: 22 mmol/L (ref 22–32)
Chloride: 102 mmol/L (ref 101–111)
Creatinine, Ser: 0.87 mg/dL (ref 0.44–1.00)
GFR calc Af Amer: >60 mL/min (ref >60)
GFR, EST NON AFRICAN AMERICAN: 59 mL/min — AB (ref >60)
GLUCOSE: 197 mg/dL — AB (ref 65–99)
POTASSIUM: 4 mmol/L (ref 3.5–5.1)
Sodium: 134 mmol/L — ABNORMAL LOW (ref 135–145)

## 2016-05-20 LAB — CBC
HEMATOCRIT: 32.8 % — AB (ref 36.0–46.0)
Hemoglobin: 10.8 g/dL — ABNORMAL LOW (ref 12.0–15.0)
MCH: 27.8 pg (ref 26.0–34.0)
MCHC: 32.9 g/dL (ref 30.0–36.0)
MCV: 84.3 fL (ref 78.0–100.0)
Platelets: 258 10*3/uL (ref 150–400)
RBC: 3.89 MIL/uL (ref 3.87–5.11)
RDW: 13.3 % (ref 11.5–15.5)
WBC: 6.9 10*3/uL (ref 4.0–10.5)

## 2016-05-20 MED ORDER — LEVOTHYROXINE SODIUM 25 MCG PO TABS
125.0000 ug | ORAL_TABLET | Freq: Every day | ORAL | Status: DC
  Administered 2016-05-20: 125 ug via ORAL
  Filled 2016-05-20: qty 1

## 2016-05-20 MED ORDER — ASPIRIN 81 MG PO TBEC: 81.0000 mg | DELAYED_RELEASE_TABLET | Freq: Every day | ORAL | Status: DC

## 2016-05-20 MED ORDER — AMLODIPINE BESYLATE 5 MG PO TABS: 5.0000 mg | ORAL_TABLET | Freq: Every day | ORAL | Status: DC

## 2016-05-20 NOTE — Discharge Summary (Signed)
Discharge Summary    Patient ID: Janet Steele,  MRN: [id number], DOB/AGE: Aug 29, 1929 82 y.o.  Admit date: 05/19/2016 Discharge date: 05/20/2016  Primary Care Provider: Binnie Rail Primary Cardiologist: Sherren Mocha   Discharge Diagnoses    Principal Problem:   Unstable angina [facility]) Active Problems:   Hypothyroidism   Diabetes ([place])   HYPERLIPIDEMIA   Essential hypertension   CAD, ARTERY BYPASS GRAFT   Pacemaker-Medtronic   Low blood pressure reading   Anemia   Diagnostic Studies/Procedures    1. Cardiac catheterization this admission, please see full report and below for summary.  _____________   History of Present Illness & Hospital Course    Ms. Nibbe is an 82 y/o F with history of CAD (CABG 1992, redo 2003), chronic angina, HLD, DM2, symptomatic bradycardia s/p Medtronic ppm, hypothyroidism, anxiety, HTN, arthritis who was recently seen in the office with crescendo angina. She does not tolerate Ranexa well and only takes intermittently due to dizziness. Her medications otherwise had been uptitrated (amlodipine, metoprolol) but she continued to have symptoms. She was loaded with Plavix. She came in for elective LHC 05/19/16 which showed new high grade obstruction in the PDA beyond the graft insertion site, s/p successful but complicated PCI of the PDA beyond the graft insertion site, complicated by localized dissection beyond the site of initial stent implantation, requiring a shorter stent to be overlapped at the distal margin. Cath otherwise showed total occlusion of SVG-diag, SVG-ramus/Cx, widely patent LIMA-LAD, severe inferior wall HK, EF 40%. Dr. Tamala Julian recommended if continued angina could consider LM stent but territory is small. Today the patient feels well. She was advised to restart metformin on 6/17 (only takes 1x/day).  Issues for f/u: - on day of d/c, BP was borderline low at times - 80/50 overnight, 94/50 with cardiac rehab, 105/34 in follow-up after AM  meds. Per our discussion, Dr. Meda Coffee has recommended for her to hold Imdur and follow-up in clinic early next week. - Prior EF in 02/2014 was 55% so could consider echocardiogram to clarify LV function in follow-up - She had mild drop in Hgb post-cath, possibly r/t hydration - consider repeat in f/u. No evidence of bleeding.  Dr. Meda Coffee has seen and examined the patient today and feels she is stable for discharge. _____________  Discharge Vitals Blood pressure 105/34, pulse 63, temperature 98.3 F (36.8 C), temperature source Oral, resp. rate 12, height 5' (1.524 m), weight 127 lb 13.9 oz (58 kg), SpO2 93 %.  Filed Weights   05/19/16 0756 05/20/16 0417  Weight: 130 lb (58.968 kg) 127 lb 13.9 oz (58 kg)    Labs & Radiologic Studies    CBC  Recent Labs  05/19/16 1439 05/20/16 0313  WBC 5.5 6.9  HGB 12.1 10.8*  HCT 36.6 32.8*  MCV 84.7 84.3  PLT 238 0000000   Basic Metabolic Panel  Recent Labs  05/18/16 1329 05/19/16 1439 05/20/16 0313  NA 136  --  134*  K 4.1  --  4.0  CL 99  --  102  CO2 23  --  22  GLUCOSE 213*  --  197*  BUN 13  --  12  CREATININE 0.84 0.71 0.87  CALCIUM 9.7  --  8.5*  _____________  No results found. Disposition   Pt is being discharged home today in good condition.  Follow-up Plans & Appointments    Follow-up Information    Follow up with Truitt Merle, NP.   Specialties:  Nurse Practitioner, Interventional  Cardiology, Cardiology, Radiology   Why:  [facility] - 05/25/16 at [place] is one of our nurse practitioners.    Contact information:   [place] [address] [phone]      Discharge Instructions    Amb Referral to Cardiac Rehabilitation    Complete by:  As directed   Diagnosis:   PTCA Coronary Stents       Diet - low sodium heart healthy    Complete by:  As directed      Increase activity slowly    Complete by:  As directed   Do not restart your Metformin until Saturday morning 05/22/16.     Your isosorbide (Imdur) was put on hold because your blood pressure was low in the hospital. Do not take this for now. We may restart this at your follow-up appointment so do not throw it away.  Your aspirin dose should be 81mg  daily.  No driving until cleared by your provider. No lifting over 5 lbs for 1 week. No sexual activity for 1 week. Keep procedure site clean & dry. If you notice increased pain, swelling, bleeding or pus, call/return!  You may shower, but no soaking baths/hot tubs/pools for 1 week.   One of your heart tests showed weakness of the heart muscle this admission. This may make you more susceptible to weight gain from fluid retention, which can lead to symptoms that we call heart failure. Please follow these special instructions: 1. Follow a low-salt diet and watch your fluid intake. In general, you should not be taking in more than 2 liters of fluid per day (no more than 8 glasses per day). Some patients are restricted to less than 1.5 liters of fluid per day (no more than 6 glasses per day). This includes sources of water in foods like soup, coffee, tea, milk, etc. 2. Weigh yourself on the same scale at same time of day and keep a log. 3. Call your doctor: (Anytime you feel any of the following symptoms)  - 3-4 pound weight gain in 1-2 days or 2 pounds overnight  - Shortness of breath, with or without a dry hacking cough  - Swelling in the hands, feet or stomach  - If you have to sleep on extra pillows at night in order to breathe  IT IS IMPORTANT TO LET YOUR DOCTOR KNOW EARLY ON IF YOU ARE HAVING SYMPTOMS SO WE CAN HELP YOU!           Discharge Medications   Current Discharge Medication List    CONTINUE these medications which have CHANGED   Details  amLODipine (NORVASC) 5 MG tablet Take 1 tablet (5 mg total) by mouth daily. Has not changed - I only removed a prior note that dose was increased in the past    aspirin 81 MG EC tablet Take 1 tablet (81 mg total) by  mouth daily. Qty: 30 tablet, Refills: 11      CONTINUE these medications which have NOT CHANGED   Details  atorvastatin (LIPITOR) 80 MG tablet take 1 tablet by mouth once daily AT 6 PM     clopidogrel (PLAVIX) 75 MG tablet Take 1 tablet (75 mg total) by mouth daily.    Associated Diagnoses: Unstable angina ([place]); Coronary artery disease involving coronary bypass graft of native heart with unstable angina pectoris (HCC)    DULoxetine (CYMBALTA) 60 MG capsule Take 1 capsule (60 mg total) by mouth daily.     gabapentin (  NEURONTIN) 100 MG capsule Take 100 mg by mouth 3 (three) times daily.    glimepiride (AMARYL) 2 MG tablet Take 2 mg by mouth daily before breakfast.     levothyroxine (SYNTHROID, LEVOTHROID) 125 MCG tablet Take 1 tablet (125 mcg total) by mouth daily.     losartan (COZAAR) 50 MG tablet Take 1 tablet (50 mg total) by mouth daily.    Associated Diagnoses: Essential hypertension    metoprolol tartrate (LOPRESSOR) 25 MG tablet Take 1 tablet (25 mg total) by mouth 2 (two) times daily.    Associated Diagnoses: Unstable angina ([place]); Coronary artery disease involving coronary bypass graft of native heart with unstable angina pectoris (HCC)    nitroGLYCERIN (NITROSTAT) 0.4 MG SL tablet Place 1 tablet (0.4 mg total) under the tongue every 5 (five) minutes as needed. For chest pain     pantoprazole (PROTONIX) 40 MG tablet Take 1 tablet (40 mg total) by mouth 2 (two) times daily.     traMADol (ULTRAM) 50 MG tablet Take 50 mg by mouth every 6 (six) hours as needed for moderate pain (back pain).      traZODone (DESYREL) 50 MG tablet Take 1 tablet (50 mg total) by mouth at bedtime as needed for sleep.     metFORMIN (GLUCOPHAGE) 500 MG tablet Take 1 tablet (500 mg total) by mouth daily with breakfast.       STOP taking these medications     isosorbide mononitrate (IMDUR) 120 MG 24 hr tablet          Allergies:  Allergies  Allergen Reactions  . Morphine Hives,  Itching and Rash  . Penicillins Itching and Rash    Has patient had a PCN reaction causing immediate rash, facial/tongue/throat swelling, SOB or lightheadedness with hypotension: Yes Has patient had a PCN reaction causing severe rash involving mucus membranes or skin necrosis: No Has patient had a PCN reaction that required hospitalization No Has patient had a PCN reaction occurring within the last 10 years: No If all of the above answers are "NO", then may proceed with Cephalosporin use.   . Prednisone Other (See Comments)    MEMORY LOSS   . Sulfonamide Derivatives Itching and Rash  . Codeine Rash  . Hydrocodone-Acetaminophen Other (See Comments)    unknown  . Meperidine Hcl Itching  . Metoclopramide Hcl Itching and Rash  . Metoprolol Succinate Other (See Comments)    nervous  . Moxifloxacin Itching  . Oxycodone-Aspirin Rash  . Pentazocine Lactate Nausea Only  . Pioglitazone Other (See Comments)    bloating     Outstanding Labs/Studies   Consider CBC in f/u  Duration of Discharge Encounter   Greater than 30 minutes including physician time.  Signed, Charlie Pitter PA-C 05/20/2016, 12:08 PM

## 2016-05-20 NOTE — Progress Notes (Signed)
CARDIAC REHAB PHASE I   PRE:  Rate/Rhythm: 65 pacing    BP: sitting 92/41    SaO2:   MODE:  Ambulation: 500 ft   POST:  Rate/Rhythm: 108 pacing    BP: sitting 94/50     SaO2:   Pt able to walk without angina. Fairly steady, BP low (registered sys in 60s after walk but recheck 94/50, asx). Ed completed with fair reception. Understands Plavix/ASA and NTG. Interested in [place] and will send referral to [place]. Pt still drives (although she keeps asking what her tele box is for) and became frustrated when I asked her about it.  [id number]   [place], ACSM 05/20/2016 9:52 AM

## 2016-05-20 NOTE — Progress Notes (Signed)
Patient Name: Janet Steele Date of Encounter: 05/20/2016  Hospital Problem List     Active Problems:   HYPERLIPIDEMIA   Essential hypertension   CAD, ARTERY BYPASS GRAFT   PVD   Chest pain   Coronary artery disease involving coronary bypass graft of native heart without angina pectoris    Subjective   Feeling well this morning. No chest pain or dyspnea.  Inpatient Medications    . amLODipine  5 mg Oral Daily  . aspirin  81 mg Oral Daily  . atorvastatin  80 mg Oral q1800  . clopidogrel  75 mg Oral Daily  . gabapentin  100 mg Oral TID  . glimepiride  2 mg Oral QAC breakfast  . heparin  5,000 Units Subcutaneous Q8H  . insulin aspart  0-15 Units Subcutaneous TID WC  . insulin aspart  0-5 Units Subcutaneous QHS  . isosorbide mononitrate  120 mg Oral Daily  . levothyroxine  125 mcg Oral QAC breakfast  . losartan  50 mg Oral Daily  . metoprolol tartrate  25 mg Oral BID  . pantoprazole  40 mg Oral BID  . sodium chloride flush  3 mL Intravenous Q12H    Vital Signs    Filed Vitals:   05/19/16 1340 05/19/16 1600 05/19/16 1929 05/20/16 0417  BP:  104/36 118/44 80/50  Pulse:  82 71 63  Temp:  97.7 F (36.5 C) 98.2 F (36.8 C) 98.3 F (36.8 C)  TempSrc:  Oral Oral Oral  Resp: 18 19 18 15   Height:      Weight:    127 lb 13.9 oz (58 kg)  SpO2:  92% 94% 93%    Intake/Output Summary (Last 24 hours) at 05/20/16 0746 Last data filed at 05/19/16 1700  Gross per 24 hour  Intake    360 ml  Output    900 ml  Net   -540 ml   Filed Weights   05/19/16 0756 05/20/16 0417  Weight: 130 lb (58.968 kg) 127 lb 13.9 oz (58 kg)    Physical Exam    General: Pleasant older female, NAD. Neuro: Alert and oriented X 3. Moves all extremities spontaneously. Psych: Normal affect. HEENT:  Normal  Neck: Supple without bruits or JVD. Lungs:  Resp regular and unlabored, CTA. Heart: RRR no s3, s4, or murmurs. Abdomen: Soft, non-tender, non-distended, BS + x 4.  Extremities: No  clubbing, cyanosis or edema. DP/PT/Radials 2+ and equal bilaterally. Right groin site stable without hematoma, and minimal bruising.   Labs    CBC  Recent Labs  05/19/16 1439 05/20/16 0313  WBC 5.5 6.9  HGB 12.1 10.8*  HCT 36.6 32.8*  MCV 84.7 84.3  PLT 238 0000000   Basic Metabolic Panel  Recent Labs  05/18/16 1329 05/19/16 1439 05/20/16 0313  NA 136  --  134*  K 4.1  --  4.0  CL 99  --  102  CO2 23  --  22  GLUCOSE 213*  --  197*  BUN 13  --  12  CREATININE 0.84 0.71 0.87  CALCIUM 9.7  --  8.5*    Telemetry    Atrial paced Rate- 70s  ECG    Atrial paced rate-62  Radiology    No results found.  Assessment & Plan    82 yo female patient of Dr. Antionette Char who presented to the office on 05/17/2016 for follow-up evaluation. She was last seen 03/05/2016. The patient is followed for coronary artery disease and chronic  angina. She has extensive CAD with initial CABG in 1982 and redo CABG in 2003. Other medical problems include hyperlipidemia, type 2 diabetes, and symptomatic bradycardia status post permanent pacemaker placement. She has been seen frequently for chronic angina. Earlier this year she had worsening angina and amlodipine was increased. This helped her symptoms and when I saw her last in March she was doing well. Unfortunately she developed recurrent symptoms of sharp chest discomfort radiating to her left arm. These have been her typical anginal symptoms. Symptoms are worse over the last 2 weeks. In the last 24 hours has taken 6 nitroglycerin with relief of her chest pain each time. Her regimen includes amlodipine, high-dose isosorbide, and Ranexa. She does not tolerate the Ranexa well and she only takes it intermittently because of dizziness. In the office she reports crescendo angina that was relieved by nitro and rest. Dr. Burt Knack added Metoprolol succinate to her medications at this visit. She was scheduled for outpatient left heart cath, and started on Plavix 300mg   load, and 75mg  daily.   1. CAD, native vessel and bypass graft disease, with accelerating angina: She underwent LHC yesterday with Dr. Tamala Julian add receive a DES to PDA beyond the graft insertion site, but was complicated by localized dissection beyond the the site of initial stent implantation requiring a shorter stent to be over lapping at the distal margin with 0% residual stenosis.  EF noted at 40%. Recommendation to continue ASA and Plavix.  --Morning Cr stable 0.87, slight drop in Hgb 12>>10.8.   Conclusion     Unstable angina due to new high-grade obstruction in the PDA beyond the graft insertion site. The saphenous vein graft itself is widely patent. Severe chest discomfort during the procedure particularly durng saphenous vein graft angiography.  Total occlusion of the saphenous vein graft to the diagonal and the saphenous vein graft to the ramus/circumflex.  Widely patent LIMA to the LAD.  Native coronary territory reveals total occlusion of the RCA, total occlusion of the proximal LAD, and diffuse high-grade obstruction in the circumflex territory.  Successful but complicated PCI of the PDA beyond the graft insertion site, complicated by localized dissection beyond the site of initial stent implantation, requiring a shorter stent to be overlapped at the distal margin. Final result is 99% stenosis reduced to 0% with TIMI grade 3 flow.  Severe inferior wall hypokinesis, assumed hibernating. EF 40%.  RECOMMENDATIONS:   Aspirin and Plavix  Discharge in a.m. if stable  If continued angina, consider LM stent but territory is small.  Check renal function in a.m., as 280 cc of contrast was used during the procedure.   Coronary Diagrams    Diagnostic Diagram           Post-Intervention Diagram          2. Essential HTN: controlled  3. Hyperlipidemia: treated with high-dose atorvastatin   Signed, Reino Bellis NP-C Pager [phone]  The patient was seen,  examined and discussed with Reino Bellis, NP-C and I agree with the above.   82 year old female with prior history of coronary artery disease status post CABG in 1982 and 2003, who presented with unstable angina and underwent left cardiac cath yesterday, and received DES to PDA beyond the graft insertion site, but was complicated by localized dissection beyond the the site of initial stent implantation requiring a shorter stent to be over lapping at the distal margin with 0% residual stenosis.  EF noted at 40%. Recommendation to continue ASA and Plavix.  --Morning  Cr stable 0.87, slight drop in Hgb 12>>10.8. She has no site of bleeding, insertion site as well. She is asymptomatic. We'll plan for discharge with early follow-up in our clinic the next week. We will continue metoprolol and losartan, high-dose atorvastatin. Her vitals are stable.  Ena Dawley 05/20/2016

## 2016-05-21 NOTE — Addendum Note (Signed)
Addended by: Freada Bergeron on: 05/21/2016 05:05 PM   Modules accepted: Orders

## 2016-05-24 ENCOUNTER — Other Ambulatory Visit: Payer: Self-pay | Admitting: *Deleted

## 2016-05-24 MED ORDER — AMLODIPINE BESYLATE 5 MG PO TABS: 5.0000 mg | ORAL_TABLET | Freq: Every day | ORAL | Status: DC

## 2016-05-25 ENCOUNTER — Ambulatory Visit (INDEPENDENT_AMBULATORY_CARE_PROVIDER_SITE_OTHER): Payer: Medicare Other | Admitting: Nurse Practitioner

## 2016-05-25 ENCOUNTER — Telehealth: Payer: Self-pay

## 2016-05-25 ENCOUNTER — Encounter: Payer: Self-pay | Admitting: Nurse Practitioner

## 2016-05-25 ENCOUNTER — Encounter: Payer: Self-pay | Admitting: Cardiology

## 2016-05-25 VITALS — BP 114/70 | HR 80 | Ht 60.0 in | Wt 127.0 lb

## 2016-05-25 DIAGNOSIS — I2510 Atherosclerotic heart disease of native coronary artery without angina pectoris: Secondary | ICD-10-CM

## 2016-05-25 DIAGNOSIS — Z9889 Other specified postprocedural states: Secondary | ICD-10-CM

## 2016-05-25 DIAGNOSIS — I25810 Atherosclerosis of coronary artery bypass graft(s) without angina pectoris: Secondary | ICD-10-CM

## 2016-05-25 DIAGNOSIS — I200 Unstable angina: Secondary | ICD-10-CM

## 2016-05-25 LAB — BASIC METABOLIC PANEL
BUN: 15 mg/dL (ref 7–25)
CO2: 26 mmol/L (ref 20–31)
Calcium: 9.2 mg/dL (ref 8.6–10.4)
Chloride: 102 mmol/L (ref 98–110)
Creat: 0.91 mg/dL — ABNORMAL HIGH (ref 0.60–0.88)
Glucose, Bld: 184 mg/dL — ABNORMAL HIGH (ref 65–99)
Potassium: 4.9 mmol/L (ref 3.5–5.3)
Sodium: 138 mmol/L (ref 135–146)

## 2016-05-25 LAB — CBC
HCT: 39.7 % (ref 35.0–45.0)
Hemoglobin: 13.6 g/dL (ref 11.7–15.5)
MCH: 28.5 pg (ref 27.0–33.0)
MCHC: 34.3 g/dL (ref 32.0–36.0)
MCV: 83.2 fL (ref 80.0–100.0)
MPV: 9.4 fL (ref 7.5–12.5)
Platelets: 359 10*3/uL (ref 140–400)
RBC: 4.77 MIL/uL (ref 3.80–5.10)
RDW: 13.5 % (ref 11.0–15.0)
WBC: 6.9 10*3/uL (ref 3.8–10.8)

## 2016-05-25 MED ORDER — PANTOPRAZOLE SODIUM 40 MG PO TBEC: 40.0000 mg | DELAYED_RELEASE_TABLET | Freq: Two times a day (BID) | ORAL | Status: DC

## 2016-05-25 NOTE — Patient Instructions (Addendum)
We will be checking the following labs today - BMET and CBC   Medication Instructions:    Continue with your current medicines.     Testing/Procedures To Be Arranged:  Echocardiogram  Follow-Up:   See Dr. Burt Knack in September (has recall)    Other Special Instructions:   N/A    If you need a refill on your cardiac medications before your next appointment, please call your pharmacy.   Call the [place] office at [phone] if you have any questions, problems or concerns.

## 2016-05-25 NOTE — Telephone Encounter (Signed)
Refilled Pantoprazole 

## 2016-05-25 NOTE — Progress Notes (Signed)
CARDIOLOGY OFFICE NOTE  Date:  05/25/2016    Rob Bunting Date of Birth: [birth year] Medical Record [id number]  PCP:  Binnie Rail, MD  Cardiologist:  Burt Knack  Chief Complaint  Patient presents with  . Chest Pain  . Coronary Artery Disease    Post cath visit - seen for Dr. Burt Knack    History of Present Illness: Janet Steele is a 82 y.o. female who presents today for a post cath visit. Seen for Dr. Burt Knack.   She has a history of CAD (CABG 1992, redo 2003), chronic angina, HLD, DM2, symptomatic bradycardia s/p [employer] PPM, hypothyroidism, anxiety, HTN, remote tobacco abuse and arthritis. She has not tolerated Ranexa well and only takes intermittently due to dizziness.   Seen earlier this month with increasing angina despite medical therapy. She was loaded with Plavix and referred for cardiac cath. She came in for elective LHC 05/19/16 which showed new high grade obstruction in the PDA beyond the graft insertion site, s/p successful but complicated PCI of the PDA beyond the graft insertion site, complicated by localized dissection beyond the site of initial stent implantation, requiring a shorter stent to be overlapped at the distal margin. Cath otherwise showed total occlusion of SVG-diag, SVG-ramus/Cx, widely patent LIMA-LAD, severe inferior wall HK, EF 40%. Dr. Tamala Julian recommended if continued angina could consider LM stent but territory is small.   Hospitalization complicated by borderline BP - Imdur was held. Also noted that prior EF was 55% back in 2015 - may need to consider echocardiogram. Also with mild drop in HGB and would need lab on follow up. These issues were to be addressed at follow up.  Comes in today. Here alone. Does not really seem to know her medicines. Feels like she is doing ok. She says she is not having any more chest pain. She feels like she is not taking the Imdur and does not see the need to restart. Not dizzy or lightheaded. She says she really enjoyed  her time at the hospital - "so cozy and warm". BP ok. She does complain of pain in the lower right abdomen - this has been chronic for many years and not related to her recent cath.  Past Medical History  Diagnosis Date  . Hypothyroidism   . Hyperlipidemia   . GERD (gastroesophageal reflux disease)   . Anxiety   . Hypertension   . PVD (peripheral vascular disease) ([place])   . Carotid bruit     a. Duplex 01/2015: patent vessels, 1-39% BICA, f/u PRN recommened.  . Colon, diverticulosis   . Hiatal hernia   . Esophageal stricture   . Unspecified hearing loss   . Diastolic dysfunction   . CAD (coronary artery disease)     a. s/p CABG 1982 b. redo CABG 2003. c. [place] despite med rx 05/2016: successful but complicated PCI of PDA beyond graft site, c/b localized dissection requiring overlapping stent.  . Colon polyp     adenomatous  . Anemia   . Presence of permanent cardiac pacemaker   . Myocardial infarction ([place]) 1977; 1982  . Type II diabetes mellitus ([place])   . History of blood transfusion     "related to my bypass"  . Osteoarthrosis, unspecified whether generalized or localized, unspecified site   . Degenerative joint disease   . Arthritis     "fingers" (05/19/2016  . Ischemic cardiomyopathy     a. Cath 05/2016: EF 40% with severe inferior wall HK, suspect hibernating myocardium.  Past Surgical History  Procedure Laterality Date  . Appendectomy    . Oophorectomy Bilateral   . Coronary artery bypass graft  1982; 2003    x4(1982),02-2002 CABG X2  . Back surgery    . Permanent pacemaker insertion N/A 07/06/2012    MDT Adapta L implanted by Dr Rayann Heman for symptomatic bradycardia  . Left heart catheterization with coronary/graft angiogram N/A 03/05/2014    Procedure: LEFT HEART CATHETERIZATION WITH Beatrix Fetters;  Surgeon: Sinclair Grooms, MD;  Location: [facility] CATH LAB;  Service: Cardiovascular;  Laterality: N/A;  . Colonoscopy    . Coronary angioplasty with stent placement   05/19/2016    "2 stents?"  . Tonsillectomy  1930s  . Cholecystectomy open    . Insert / replace / remove pacemaker    . Lumbar disc surgery  01-2000  . Cataract extraction w/ intraocular lens  implant, bilateral Bilateral   . Abdominal hysterectomy    . Esophagogastroduodenoscopy (egd) with esophageal dilation  "2-3 times"  . Cardiac catheterization N/A 05/19/2016    Procedure: Left Heart Cath and Cors/Grafts Angiography;  Surgeon: Belva Crome, MD;  Location: [place] CV LAB;  Service: Cardiovascular;  Laterality: N/A;  . Cardiac catheterization N/A 05/19/2016    Procedure: Coronary Stent Intervention;  Surgeon: Belva Crome, MD;  Location: [place] CV LAB;  Service: Cardiovascular;  Laterality: N/A;     Medications: Current Outpatient Prescriptions  Medication Sig Dispense Refill  . amLODipine (NORVASC) 5 MG tablet Take 1 tablet (5 mg total) by mouth daily. 30 tablet 6  . aspirin 81 MG EC tablet Take 1 tablet (81 mg total) by mouth daily. 30 tablet 11  . atorvastatin (LIPITOR) 80 MG tablet take 1 tablet by mouth once daily AT 6 PM 30 tablet 3  . clopidogrel (PLAVIX) 75 MG tablet Take 1 tablet (75 mg total) by mouth daily. 30 tablet 6  . DULoxetine (CYMBALTA) 60 MG capsule Take 1 capsule (60 mg total) by mouth daily. 90 capsule 1  . gabapentin (NEURONTIN) 100 MG capsule Take 100 mg by mouth 3 (three) times daily.    Marland Kitchen glimepiride (AMARYL) 2 MG tablet Take 2 mg by mouth daily before breakfast.     . levothyroxine (SYNTHROID, LEVOTHROID) 125 MCG tablet Take 1 tablet (125 mcg total) by mouth daily. 90 tablet 1  . losartan (COZAAR) 50 MG tablet Take 1 tablet (50 mg total) by mouth daily. 30 tablet 5  . metFORMIN (GLUCOPHAGE) 500 MG tablet Take 1 tablet (500 mg total) by mouth daily with breakfast. 90 tablet 3  . metoprolol tartrate (LOPRESSOR) 25 MG tablet Take 1 tablet (25 mg total) by mouth 2 (two) times daily. 60 tablet 6  . nitroGLYCERIN (NITROSTAT) 0.4 MG SL tablet Place 1 tablet  (0.4 mg total) under the tongue every 5 (five) minutes as needed. For chest pain 25 tablet 5  . pantoprazole (PROTONIX) 40 MG tablet Take 1 tablet (40 mg total) by mouth 2 (two) times daily. 60 tablet 6  . traMADol (ULTRAM) 50 MG tablet Take 50 mg by mouth every 6 (six) hours as needed for moderate pain (back pain).   0  . traZODone (DESYREL) 50 MG tablet Take 1 tablet (50 mg total) by mouth at bedtime as needed for sleep. 145 tablet 1   No current facility-administered medications for this visit.    Allergies: Allergies  Allergen Reactions  . Morphine Hives, Itching and Rash  . Penicillins Itching and Rash  Has patient had a PCN reaction causing immediate rash, facial/tongue/throat swelling, SOB or lightheadedness with hypotension: Yes Has patient had a PCN reaction causing severe rash involving mucus membranes or skin necrosis: No Has patient had a PCN reaction that required hospitalization No Has patient had a PCN reaction occurring within the last 10 years: No If all of the above answers are "NO", then may proceed with Cephalosporin use.   . Prednisone Other (See Comments)    MEMORY LOSS   . Sulfonamide Derivatives Itching and Rash  . Codeine Rash  . Hydrocodone-Acetaminophen Other (See Comments)    unknown  . Meperidine Hcl Itching  . Metoclopramide Hcl Itching and Rash  . Metoprolol Succinate Other (See Comments)    nervous  . Moxifloxacin Itching  . Oxycodone-Aspirin Rash  . Pentazocine Lactate Nausea Only  . Pioglitazone Other (See Comments)    bloating    Social History: The patient  reports that she quit smoking about 39 years ago. Her smoking use included Cigarettes. She has a 15 pack-year smoking history. She has never used smokeless tobacco. She reports that she drinks about 1.8 oz of alcohol per week. She reports that she does not use illicit drugs.   Family History: The patient's family history includes Heart attack in her sister; Heart disease in her sister.  There is no history of Colon cancer, Breast cancer, Celiac disease, Cirrhosis, Clotting disorder, Colitis, Colon polyps, Crohn's disease, Cystic fibrosis, Diabetes, Esophageal cancer, Hemochromatosis, Inflammatory bowel disease, Irritable bowel syndrome, Kidney disease, Liver cancer, Liver disease, Ovarian cancer, Pancreatic cancer, Prostate cancer, Rectal cancer, Stomach cancer, Ulcerative colitis, Uterine cancer, or Wilson's disease.   Review of Systems: Please see the history of present illness.   Otherwise, the review of systems is positive for none.   All other systems are reviewed and negative.   Physical Exam: VS:  BP 114/70 mmHg  Pulse 80  Ht 5' (1.524 m)  Wt 127 lb (57.607 kg)  BMI 24.80 kg/m2  SpO2 99% .  BMI Body mass index is 24.8 kg/(m^2).  Wt Readings from Last 3 Encounters:  05/25/16 127 lb (57.607 kg)  05/20/16 127 lb 13.9 oz (58 kg)  05/17/16 130 lb (58.968 kg)    General: Pleasant. She is thin. She is alert and in no acute distress.  HEENT: Normal. But she has a tremor of her head noted. Neck: Supple, no JVD, carotid bruits, or masses noted.  Cardiac: Regular rate and rhythm. No murmurs, rubs, or gallops. No edema.  Respiratory:  Lungs are clear to auscultation bilaterally with normal work of breathing.  GI: Soft and nontender.  MS: No deformity or atrophy. Gait and ROM intact. Skin: Warm and dry. Color is normal.  Neuro:  Strength and sensation are intact and no gross focal deficits noted.  Psych: Alert, appropriate and with normal affect.   LABORATORY DATA:  EKG:  EKG is not ordered today.  Lab Results  Component Value Date   WBC 6.9 05/20/2016   HGB 10.8* 05/20/2016   HCT 32.8* 05/20/2016   PLT 258 05/20/2016   GLUCOSE 197* 05/20/2016   CHOL 212* 05/15/2014   TRIG 182.0* 05/15/2014   HDL 32.80* 05/15/2014   LDLDIRECT 98.6 02/23/2010   LDLCALC 143* 05/15/2014   ALT 18 12/23/2015   AST 23 12/23/2015   NA 134* 05/20/2016   K 4.0 05/20/2016   CL 102  05/20/2016   CREATININE 0.87 05/20/2016   BUN 12 05/20/2016   CO2 22 05/20/2016   TSH  4.89* 04/30/2016   INR 1.0 05/18/2016   HGBA1C 7.1* 05/27/2015   MICROALBUR 3.6* 05/28/2010    BNP (last 3 results) No results for input(s): BNP in the last 8760 hours.  ProBNP (last 3 results) No results for input(s): PROBNP in the last 8760 hours.   Other Studies Reviewed Today: Procedures    Coronary Stent Intervention   Left Heart Cath and Cors/Grafts Angiography    Conclusion     Unstable angina due to new high-grade obstruction in the PDA beyond the graft insertion site. The saphenous vein graft itself is widely patent. Severe chest discomfort during the procedure particularly durng saphenous vein graft angiography.  Total occlusion of the saphenous vein graft to the diagonal and the saphenous vein graft to the ramus/circumflex.  Widely patent LIMA to the LAD.  Native coronary territory reveals total occlusion of the RCA, total occlusion of the proximal LAD, and diffuse high-grade obstruction in the circumflex territory.  Successful but complicated PCI of the PDA beyond the graft insertion site, complicated by localized dissection beyond the site of initial stent implantation, requiring a shorter stent to be overlapped at the distal margin. Final result is 99% stenosis reduced to 0% with TIMI grade 3 flow.  Severe inferior wall hypokinesis, assumed hibernating. EF 40%.  RECOMMENDATIONS:   Aspirin and Plavix  Discharge in a.m. if stable  If continued angina, consider LM stent but territory is small.  Check renal function in a.m., as 280 cc of contrast was used during the procedure.     Assessment/Plan: 1. Extensive CAD with recent repeat cath withsuccessful but complicated PCI of the PDA beyond the graft insertion site, complicated by localized dissection beyond the site of initial stent implantation, requiring a shorter stent to be overlapped at the distal margin.  Committed to long term DAPT. She is doing well without symptoms at this time. Will recheck her labs today. Hold on restarting Imdur for now.   2. Mild drop in HGB - needs repeat lab today. Remains on DAPT therapy.  3. Mild LV dysfunction - check echo for correlation  4. Low BP - currently off Imdur - will continue to hold.   5. HTN - BP stable on her current regimen  6. HLD - on statin therapy  Current medicines are reviewed with the patient today.  The patient does not have concerns regarding medicines other than what has been noted above.  The following changes have been made:  See above.  Labs/ tests ordered today include:    Orders Placed This Encounter  Procedures  . Basic metabolic panel  . CBC  . ECHOCARDIOGRAM COMPLETE     Disposition:   FU with Dr. Burt Knack in 3 months.   Patient is agreeable to this plan and will call if any problems develop in the interim.   Signed: Burtis Junes, RN, ANP-C 05/25/2016 10:19 AM  [facility] [address] [place], [place]  [address] Phone: [phone] Fax: [phone]

## 2016-06-14 ENCOUNTER — Other Ambulatory Visit: Payer: Self-pay

## 2016-06-14 ENCOUNTER — Ambulatory Visit (HOSPITAL_COMMUNITY): Payer: Medicare Other | Attending: Internal Medicine

## 2016-06-14 DIAGNOSIS — Z951 Presence of aortocoronary bypass graft: Secondary | ICD-10-CM | POA: Diagnosis not present

## 2016-06-14 DIAGNOSIS — Z9889 Other specified postprocedural states: Secondary | ICD-10-CM | POA: Diagnosis not present

## 2016-06-14 DIAGNOSIS — I25810 Atherosclerosis of coronary artery bypass graft(s) without angina pectoris: Secondary | ICD-10-CM | POA: Insufficient documentation

## 2016-06-14 DIAGNOSIS — E119 Type 2 diabetes mellitus without complications: Secondary | ICD-10-CM | POA: Insufficient documentation

## 2016-06-14 DIAGNOSIS — I429 Cardiomyopathy, unspecified: Secondary | ICD-10-CM

## 2016-06-14 DIAGNOSIS — I255 Ischemic cardiomyopathy: Secondary | ICD-10-CM | POA: Diagnosis not present

## 2016-06-14 DIAGNOSIS — E785 Hyperlipidemia, unspecified: Secondary | ICD-10-CM | POA: Insufficient documentation

## 2016-06-14 DIAGNOSIS — I358 Other nonrheumatic aortic valve disorders: Secondary | ICD-10-CM | POA: Diagnosis not present

## 2016-06-29 ENCOUNTER — Encounter: Payer: Medicare Other | Admitting: Internal Medicine

## 2016-06-29 NOTE — Progress Notes (Signed)
Subjective:    Patient ID: Janet Steele, female    DOB: [birth year], 82 y.o.   MRN: [id number]  HPI   precharting done,   No show   She is here for follow up.  CAD:  She had a CABG in 1992, redo in 2003 and has had chronic angina.  She was seen by cardiology for increased angina and had a cardiac catherization. She was found to have a high grade obstruction in the PDA and had PCI that was complicated by a small dissection that required an additional stent be placed.  Her EF was 40% with severe inferior wall HK.  Repeat Echo earlier this month shows an EF of 55-60%.  Hypertension: She is taking her medication daily. She is compliant with a low sodium diet.  She denies chest pain, palpitations, edema, shortness of breath and regular headaches. She is exercising regularly.  She does not monitor her blood pressure at home.    Hyperlipidemia: She is taking her medication daily. She is compliant with a low fat/cholesterol diet. She is exercising regularly. She denies myalgias.   GERD:  She is taking her medication daily as prescribed.  She denies any GERD symptoms and feels her GERD is well controlled.   Diabetes: She is taking her medication daily as prescribed. She is compliant with a diabetic diet. She is exercising regularly. She monitors her sugars and they have been running XXX. She checks her feet daily and denies foot lesions. She is up-to-date with an ophthalmology examination.   Hypothyroidism:  She is taking her medication daily.  She denies any recent changes in energy or weight that are unexplained.    Medications and allergies reviewed with patient and updated if appropriate.  Patient Active Problem List   Diagnosis Date Noted  . Low blood pressure reading 05/20/2016  . Anemia 05/20/2016  . Coronary artery disease involving coronary bypass graft of native heart without angina pectoris 05/19/2016  . Emesis 04/05/2016  . Numbness and tingling of foot 12/31/2015  . MCI (mild  cognitive impairment) with memory loss 11/18/2015  . Facial tic 05/07/2015  . Facial nerve spasm 05/07/2015  . Sick sinus syndrome ([place]) 02/26/2015  . Cervical dystonia 12/04/2014  . Chronic motor tic 09/12/2014  . Insomnia due to drug ([place]) 05/03/2014  . Nonspecific abnormal electrocardiogram (ECG) (EKG)   . Unstable angina ([place]) 03/02/2014  . Pacemaker-Medtronic 07/07/2012  . Bradycardia 07/05/2012  . GERD (gastroesophageal reflux disease) 07/05/2012  . Near syncope 12/23/2011  . Abnormal involuntary movements(781.0) 12/17/2009  . SLEEP DISORDER 12/11/2009  . CAD, ARTERY BYPASS GRAFT 09/24/2009  . CAROTID BRUIT 09/24/2009  . Diabetes ([place]) 01/30/2009  . DYSPHAGIA 06/19/2008  . ANXIETY 02/14/2008  . EROSIVE ESOPHAGITIS 02/14/2008  . CONSTIPATION, CHRONIC 02/14/2008  . DEGENERATIVE JOINT DISEASE 02/14/2008  . HEARING LOSS 01/04/2008  . PVD 01/04/2008  . Hypothyroidism 09/25/2007  . HYPERLIPIDEMIA 09/25/2007  . Essential hypertension 09/07/2007  . HYPERTRIGLYCERIDEMIA 03/16/2007  . Diabetic polyneuropathy ([place]) 03/16/2007  . Stricture and stenosis of esophagus 03/16/2007  . HIATAL HERNIA 02/16/2007  . HEMORRHOIDS 12/31/1997  . DIVERTICULOSIS, COLON 12/31/1997    Current Outpatient Prescriptions on File Prior to Visit  Medication Sig Dispense Refill  . amLODipine (NORVASC) 5 MG tablet Take 1 tablet (5 mg total) by mouth daily. 30 tablet 6  . aspirin 81 MG EC tablet Take 1 tablet (81 mg total) by mouth daily. 30 tablet 11  . atorvastatin (LIPITOR) 80 MG tablet take 1 tablet  by mouth once daily AT 6 PM 30 tablet 3  . clopidogrel (PLAVIX) 75 MG tablet Take 1 tablet (75 mg total) by mouth daily. 30 tablet 6  . DULoxetine (CYMBALTA) 60 MG capsule Take 1 capsule (60 mg total) by mouth daily. 90 capsule 1  . gabapentin (NEURONTIN) 100 MG capsule Take 100 mg by mouth 3 (three) times daily.    Marland Kitchen glimepiride (AMARYL) 2 MG tablet Take 2 mg by mouth daily before breakfast.     .  levothyroxine (SYNTHROID, LEVOTHROID) 125 MCG tablet Take 1 tablet (125 mcg total) by mouth daily. 90 tablet 1  . losartan (COZAAR) 50 MG tablet Take 1 tablet (50 mg total) by mouth daily. 30 tablet 5  . metFORMIN (GLUCOPHAGE) 500 MG tablet Take 1 tablet (500 mg total) by mouth daily with breakfast. 90 tablet 3  . metoprolol tartrate (LOPRESSOR) 25 MG tablet Take 1 tablet (25 mg total) by mouth 2 (two) times daily. 60 tablet 6  . nitroGLYCERIN (NITROSTAT) 0.4 MG SL tablet Place 1 tablet (0.4 mg total) under the tongue every 5 (five) minutes as needed. For chest pain 25 tablet 5  . pantoprazole (PROTONIX) 40 MG tablet Take 1 tablet (40 mg total) by mouth 2 (two) times daily. 60 tablet 6  . traMADol (ULTRAM) 50 MG tablet Take 50 mg by mouth every 6 (six) hours as needed for moderate pain (back pain).   0  . traZODone (DESYREL) 50 MG tablet Take 1 tablet (50 mg total) by mouth at bedtime as needed for sleep. 145 tablet 1   No current facility-administered medications on file prior to visit.     Past Medical History:  Diagnosis Date  . Anemia   . Anxiety   . Arthritis    "fingers" (05/19/2016  . CAD (coronary artery disease)    a. s/p CABG 1982 b. redo CABG 2003. c. [place] despite med rx 05/2016: successful but complicated PCI of PDA beyond graft site, c/b localized dissection requiring overlapping stent.  . Carotid bruit    a. Duplex 01/2015: patent vessels, 1-39% BICA, f/u PRN recommened.  . Colon polyp    adenomatous  . Colon, diverticulosis   . Degenerative joint disease   . Diastolic dysfunction   . Esophageal stricture   . GERD (gastroesophageal reflux disease)   . Hiatal hernia   . History of blood transfusion    "related to my bypass"  . Hyperlipidemia   . Hypertension   . Hypothyroidism   . Ischemic cardiomyopathy    a. Cath 05/2016: EF 40% with severe inferior wall HK, suspect hibernating myocardium.  . Myocardial infarction ([place]) 1977; 1982  . Osteoarthrosis, unspecified whether  generalized or localized, unspecified site   . Presence of permanent cardiac pacemaker   . PVD (peripheral vascular disease) ([place])   . Type II diabetes mellitus ([place])   . Unspecified hearing loss     Past Surgical History:  Procedure Laterality Date  . ABDOMINAL HYSTERECTOMY    . APPENDECTOMY    . BACK SURGERY    . CARDIAC CATHETERIZATION N/A 05/19/2016   Procedure: Left Heart Cath and Cors/Grafts Angiography;  Surgeon: Belva Crome, MD;  Location: [facility];  Service: Cardiovascular;  Laterality: N/A;  . CARDIAC CATHETERIZATION N/A 05/19/2016   Procedure: Coronary Stent Intervention;  Surgeon: Belva Crome, MD;  Location: [facility];  Service: Cardiovascular;  Laterality: N/A;  . CATARACT EXTRACTION W/ INTRAOCULAR LENS  IMPLANT, BILATERAL Bilateral   .  CHOLECYSTECTOMY OPEN    . COLONOSCOPY    . CORONARY ANGIOPLASTY WITH STENT PLACEMENT  05/19/2016   "2 stents?"  . CORONARY ARTERY BYPASS GRAFT  1982; 2003   x4(1982),02-2002 CABG X2  . ESOPHAGOGASTRODUODENOSCOPY (EGD) WITH ESOPHAGEAL DILATION  "2-3 times"  . INSERT / REPLACE / REMOVE PACEMAKER    . LEFT HEART CATHETERIZATION WITH CORONARY/GRAFT ANGIOGRAM N/A 03/05/2014   Procedure: LEFT HEART CATHETERIZATION WITH Beatrix Fetters;  Surgeon: Sinclair Grooms, MD;  Location[facility];  Service: Cardiovascular;  Laterality: N/A;  . LUMBAR [place] SURGERY  01-2000  . OOPHORECTOMY Bilateral   . PERMANENT PACEMAKER INSERTION N/A 07/06/2012   MDT Adapta L implanted by Dr Rayann Heman for symptomatic bradycardia  . TONSILLECTOMY  1930s    Social History   Social History  . Marital status: Married    Spouse name: [name]  . Number of children: 3  . Years of education: 16   Occupational History  . Retired    .  Retired   Social History Main Topics  . Smoking status: Former Smoker    Packs/day: 0.50    Years: 30.00    Types: Cigarettes    Quit date: 07/05/1976  . Smokeless tobacco: Never Used  . Alcohol use 1.8  oz/week    3 Glasses of wine per week  . Drug use: No  . Sexual activity: No   Other Topics Concern  . Not on file   Social History Narrative   Patient is married [name]) and lives at home with her husband.   Patient has three adult children.   Patient is retired.   Patient has a college education.   Patient is right-handed.   Patient does not drink any caffeine.    Family History  Problem Relation Age of Onset  . Heart disease Sister   . Heart attack Sister   . Colon cancer Neg Hx   . Breast cancer Neg Hx   . Celiac disease Neg Hx   . Cirrhosis Neg Hx   . Clotting disorder Neg Hx   . Colitis Neg Hx   . Colon polyps Neg Hx   . Crohn's disease Neg Hx   . Cystic fibrosis Neg Hx   . Diabetes Neg Hx   . Esophageal cancer Neg Hx   . Hemochromatosis Neg Hx   . Inflammatory bowel disease Neg Hx   . Irritable bowel syndrome Neg Hx   . Kidney disease Neg Hx   . Liver cancer Neg Hx   . Liver disease Neg Hx   . Ovarian cancer Neg Hx   . Pancreatic cancer Neg Hx   . Prostate cancer Neg Hx   . Rectal cancer Neg Hx   . Stomach cancer Neg Hx   . Ulcerative colitis Neg Hx   . Uterine cancer Neg Hx   . Wilson's disease Neg Hx     Review of Systems     Objective:  There were no vitals filed for this visit. There were no vitals filed for this visit. There is no height or weight on file to calculate BMI.   Physical Exam          Assessment & Plan:   See Problem List for Assessment and Plan of chronic medical problems.   This encounter was created in error - please disregard.

## 2016-07-14 ENCOUNTER — Other Ambulatory Visit: Payer: Self-pay | Admitting: Cardiovascular Disease

## 2016-08-02 DIAGNOSIS — S0501XA Injury of conjunctiva and corneal abrasion without foreign body, right eye, initial encounter: Secondary | ICD-10-CM | POA: Diagnosis not present

## 2016-08-03 ENCOUNTER — Encounter: Payer: Self-pay | Admitting: Internal Medicine

## 2016-08-03 ENCOUNTER — Other Ambulatory Visit (INDEPENDENT_AMBULATORY_CARE_PROVIDER_SITE_OTHER): Payer: Medicare Other

## 2016-08-03 ENCOUNTER — Ambulatory Visit (INDEPENDENT_AMBULATORY_CARE_PROVIDER_SITE_OTHER): Payer: Medicare Other | Admitting: Internal Medicine

## 2016-08-03 VITALS — BP 104/58 | HR 84 | Temp 97.6°F | Resp 16 | Wt 127.0 lb

## 2016-08-03 DIAGNOSIS — E782 Mixed hyperlipidemia: Secondary | ICD-10-CM

## 2016-08-03 DIAGNOSIS — K219 Gastro-esophageal reflux disease without esophagitis: Secondary | ICD-10-CM | POA: Diagnosis not present

## 2016-08-03 DIAGNOSIS — E1165 Type 2 diabetes mellitus with hyperglycemia: Secondary | ICD-10-CM

## 2016-08-03 DIAGNOSIS — F419 Anxiety disorder, unspecified: Secondary | ICD-10-CM

## 2016-08-03 DIAGNOSIS — E039 Hypothyroidism, unspecified: Secondary | ICD-10-CM | POA: Diagnosis not present

## 2016-08-03 DIAGNOSIS — I10 Essential (primary) hypertension: Secondary | ICD-10-CM

## 2016-08-03 DIAGNOSIS — I200 Unstable angina: Secondary | ICD-10-CM | POA: Diagnosis not present

## 2016-08-03 DIAGNOSIS — G3184 Mild cognitive impairment, so stated: Secondary | ICD-10-CM

## 2016-08-03 DIAGNOSIS — G479 Sleep disorder, unspecified: Secondary | ICD-10-CM

## 2016-08-03 LAB — COMPREHENSIVE METABOLIC PANEL
ALT: 18 U/L (ref 0–35)
AST: 18 U/L (ref 0–37)
Albumin: 4 g/dL (ref 3.5–5.2)
Alkaline Phosphatase: 99 U/L (ref 39–117)
BUN: 19 mg/dL (ref 6–23)
CALCIUM: 9 mg/dL (ref 8.4–10.5)
CHLORIDE: 102 meq/L (ref 96–112)
CO2: 31 meq/L (ref 19–32)
CREATININE: 1.06 mg/dL (ref 0.40–1.20)
GFR: 52.39 mL/min — ABNORMAL LOW (ref >60.00)
GLUCOSE: 165 mg/dL — AB (ref 70–99)
Potassium: 4.6 mEq/L (ref 3.5–5.1)
Sodium: 137 mEq/L (ref 135–145)
Total Bilirubin: 0.7 mg/dL (ref 0.2–1.2)
Total Protein: 7 g/dL (ref 6.0–8.3)

## 2016-08-03 LAB — HEMOGLOBIN A1C: HEMOGLOBIN A1C: 7.9 % — AB (ref 4.6–6.5)

## 2016-08-03 LAB — LIPID PANEL
CHOL/HDL RATIO: 9
Cholesterol: 267 mg/dL — ABNORMAL HIGH (ref 0–200)
HDL: 31 mg/dL — ABNORMAL LOW (ref >39.00)
NONHDL: 235.73
TRIGLYCERIDES: 338 mg/dL — AB (ref 0.0–149.0)
VLDL: 67.6 mg/dL — ABNORMAL HIGH (ref 0.0–40.0)

## 2016-08-03 LAB — TSH: TSH: 0.97 u[IU]/mL (ref 0.35–4.50)

## 2016-08-03 LAB — LDL CHOLESTEROL, DIRECT: LDL DIRECT: 173 mg/dL

## 2016-08-03 NOTE — Assessment & Plan Note (Signed)
Controlled, stable Continue current dose of medication  

## 2016-08-03 NOTE — Progress Notes (Signed)
Subjective:    Patient ID: Janet Steele, female    DOB: [birth year], 82 y.o.   MRN: [id number]  HPI She is here for follow up.  2-3 times a week at night she gets very hot. They feel like a hot flash. She denies any episodes during the day.  It started a couple of months ago.    Her admits her memory is not that good.  She does not want to have it evaluated further at this time by neurology.    CAD:  She had a CABG in 1992, redo in 2003 and has had chronic angina.  She was seen by cardiology for increased angina and had a cardiac catherization. She was found to have a high grade obstruction in the PDA and had PCI that was complicated by a small dissection that required an additional stent be placed.  Her EF was 40% with severe inferior wall HK.  Repeat Echo earlier this month shows an EF of 55-60%.  Hypertension: She is taking her medication daily. She is compliant with a low sodium diet.  She denies chest pain, palpitations, edema, shortness of breath and regular headaches. She is not exercising regularly.  She does not monitor her blood pressure at home.    Hyperlipidemia: She is taking her medication daily. She is compliant with a low fat/cholesterol diet. She is not exercising regularly. She denies myalgias.   GERD:  She is taking her medication daily as prescribed.  She denies any GERD symptoms and feels her GERD is well controlled.   Diabetes: She is taking her medication daily as prescribed. She is compliant with a diabetic diet. She is walking on occasion. She monitors her sugars and does not remember her numbers. She checks her feet daily and denies foot lesions. She is up-to-date with an ophthalmology examination.   Hypothyroidism:  She is taking her medication daily.  She denies any recent changes in energy or weight that are unexplained - she has been trying to lose weight.    Medications and allergies reviewed with patient and updated if appropriate.  Patient Active  Problem List   Diagnosis Date Noted  . Low blood pressure reading 05/20/2016  . Anemia 05/20/2016  . Coronary artery disease involving coronary bypass graft of native heart without angina pectoris 05/19/2016  . Emesis 04/05/2016  . Numbness and tingling of foot 12/31/2015  . MCI (mild cognitive impairment) with memory loss 11/18/2015  . Facial tic 05/07/2015  . Facial nerve spasm 05/07/2015  . Sick sinus syndrome ([place]) 02/26/2015  . Cervical dystonia 12/04/2014  . Chronic motor tic 09/12/2014  . Nonspecific abnormal electrocardiogram (ECG) (EKG)   . Unstable angina ([place]) 03/02/2014  . Pacemaker-Medtronic 07/07/2012  . Bradycardia 07/05/2012  . GERD (gastroesophageal reflux disease) 07/05/2012  . Near syncope 12/23/2011  . Abnormal involuntary movements(781.0) 12/17/2009  . SLEEP DISORDER 12/11/2009  . CAD, ARTERY BYPASS GRAFT 09/24/2009  . CAROTID BRUIT 09/24/2009  . Diabetes ([place]) 01/30/2009  . DYSPHAGIA 06/19/2008  . ANXIETY 02/14/2008  . EROSIVE ESOPHAGITIS 02/14/2008  . CONSTIPATION, CHRONIC 02/14/2008  . DEGENERATIVE JOINT DISEASE 02/14/2008  . HEARING LOSS 01/04/2008  . PVD 01/04/2008  . Hypothyroidism 09/25/2007  . HYPERLIPIDEMIA 09/25/2007  . Essential hypertension 09/07/2007  . HYPERTRIGLYCERIDEMIA 03/16/2007  . Diabetic polyneuropathy ([place]) 03/16/2007  . Stricture and stenosis of esophagus 03/16/2007  . HIATAL HERNIA 02/16/2007  . HEMORRHOIDS 12/31/1997  . DIVERTICULOSIS, COLON 12/31/1997    Current Outpatient Prescriptions on File Prior to Visit  Medication Sig Dispense Refill  . amLODipine (NORVASC) 5 MG tablet Take 1 tablet (5 mg total) by mouth daily. 30 tablet 6  . aspirin 81 MG EC tablet Take 1 tablet (81 mg total) by mouth daily. 30 tablet 11  . atorvastatin (LIPITOR) 80 MG tablet take 1 tablet by mouth once daily AT 6 PM 30 tablet 3  . clopidogrel (PLAVIX) 75 MG tablet Take 1 tablet (75 mg total) by mouth daily. 30 tablet 6  . DULoxetine (CYMBALTA) 60  MG capsule Take 1 capsule (60 mg total) by mouth daily. 90 capsule 1  . gabapentin (NEURONTIN) 100 MG capsule Take 100 mg by mouth 3 (three) times daily.    Marland Kitchen glimepiride (AMARYL) 2 MG tablet Take 2 mg by mouth daily before breakfast.     . levothyroxine (SYNTHROID, LEVOTHROID) 125 MCG tablet Take 1 tablet (125 mcg total) by mouth daily. 90 tablet 1  . losartan (COZAAR) 50 MG tablet Take 1 tablet (50 mg total) by mouth daily. 30 tablet 5  . metFORMIN (GLUCOPHAGE) 500 MG tablet Take 1 tablet (500 mg total) by mouth daily with breakfast. 90 tablet 3  . metoprolol tartrate (LOPRESSOR) 25 MG tablet Take 1 tablet (25 mg total) by mouth 2 (two) times daily. 60 tablet 6  . nitroGLYCERIN (NITROSTAT) 0.4 MG SL tablet Place 1 tablet (0.4 mg total) under the tongue every 5 (five) minutes as needed. For chest pain 25 tablet 5  . pantoprazole (PROTONIX) 40 MG tablet Take 1 tablet (40 mg total) by mouth 2 (two) times daily. 60 tablet 6  . traMADol (ULTRAM) 50 MG tablet Take 50 mg by mouth every 6 (six) hours as needed for moderate pain (back pain).   0  . traZODone (DESYREL) 50 MG tablet Take 1 tablet (50 mg total) by mouth at bedtime as needed for sleep. 145 tablet 1   No current facility-administered medications on file prior to visit.     Past Medical History:  Diagnosis Date  . Anemia   . Anxiety   . Arthritis    "fingers" (05/19/2016  . CAD (coronary artery disease)    a. s/p CABG 1982 b. redo CABG 2003. c. [place] despite med rx 05/2016: successful but complicated PCI of PDA beyond graft site, c/b localized dissection requiring overlapping stent.  . Carotid bruit    a. Duplex 01/2015: patent vessels, 1-39% BICA, f/u PRN recommened.  . Colon polyp    adenomatous  . Colon, diverticulosis   . Degenerative joint disease   . Diastolic dysfunction   . Esophageal stricture   . GERD (gastroesophageal reflux disease)   . Hiatal hernia   . History of blood transfusion    "related to my bypass"  .  Hyperlipidemia   . Hypertension   . Hypothyroidism   . Ischemic cardiomyopathy    a. Cath 05/2016: EF 40% with severe inferior wall HK, suspect hibernating myocardium.  . Myocardial infarction ([place]) 1977; 1982  . Osteoarthrosis, unspecified whether generalized or localized, unspecified site   . Presence of permanent cardiac pacemaker   . PVD (peripheral vascular disease) ([place])   . Type II diabetes mellitus ([place])   . Unspecified hearing loss     Past Surgical History:  Procedure Laterality Date  . ABDOMINAL HYSTERECTOMY    . APPENDECTOMY    . BACK SURGERY    . CARDIAC CATHETERIZATION N/A 05/19/2016   Procedure: Left Heart Cath and Cors/Grafts Angiography;  Surgeon: Belva Crome, MD;  Location: [facility];  Service: Cardiovascular;  Laterality: N/A;  . CARDIAC CATHETERIZATION N/A 05/19/2016   Procedure: Coronary Stent Intervention;  Surgeon: Belva Crome, MD;  Location: [facility];  Service: Cardiovascular;  Laterality: N/A;  . CATARACT EXTRACTION W/ INTRAOCULAR LENS  IMPLANT, BILATERAL Bilateral   . CHOLECYSTECTOMY OPEN    . COLONOSCOPY    . CORONARY ANGIOPLASTY WITH STENT PLACEMENT  05/19/2016   "2 stents?"  . CORONARY ARTERY BYPASS GRAFT  1982; 2003   x4(1982),02-2002 CABG X2  . ESOPHAGOGASTRODUODENOSCOPY (EGD) WITH ESOPHAGEAL DILATION  "2-3 times"  . INSERT / REPLACE / REMOVE PACEMAKER    . LEFT HEART CATHETERIZATION WITH CORONARY/GRAFT ANGIOGRAM N/A 03/05/2014   Procedure: LEFT HEART CATHETERIZATION WITH Beatrix Fetters;  Surgeon: Sinclair Grooms, MD;  Location[facility] C[facility] LAB;  Service: Cardiovascular;  Laterality: N/A;  . LUMBAR [place] SURGERY  01-2000  . OOPHORECTOMY Bilateral   . PERMANENT PACEMAKER INSERTION N/A 07/06/2012   MDT Adapta L implanted by Dr Rayann Heman for symptomatic bradycardia  . TONSILLECTOMY  1930s    Social History   Social History  . Marital status: Married    Spouse name: [name]  . Number of children: 3  . Years of education: 16    Occupational History  . Retired    .  Retired   Social History Main Topics  . Smoking status: Former Smoker    Packs/day: 0.50    Years: 30.00    Types: Cigarettes    Quit date: 07/05/1976  . Smokeless tobacco: Never Used  . Alcohol use 1.8 oz/week    3 Glasses of wine per week  . Drug use: No  . Sexual activity: No   Other Topics Concern  . None   Social History Narrative   Patient is married [name]) and lives at home with her husband.   Patient has three adult children.   Patient is retired.   Patient has a college education.   Patient is right-handed.   Patient does not drink any caffeine.    Family History  Problem Relation Age of Onset  . Heart disease Sister   . Heart attack Sister   . Colon cancer Neg Hx   . Breast cancer Neg Hx   . Celiac disease Neg Hx   . Cirrhosis Neg Hx   . Clotting disorder Neg Hx   . Colitis Neg Hx   . Colon polyps Neg Hx   . Crohn's disease Neg Hx   . Cystic fibrosis Neg Hx   . Diabetes Neg Hx   . Esophageal cancer Neg Hx   . Hemochromatosis Neg Hx   . Inflammatory bowel disease Neg Hx   . Irritable bowel syndrome Neg Hx   . Kidney disease Neg Hx   . Liver cancer Neg Hx   . Liver disease Neg Hx   . Ovarian cancer Neg Hx   . Pancreatic cancer Neg Hx   . Prostate cancer Neg Hx   . Rectal cancer Neg Hx   . Stomach cancer Neg Hx   . Ulcerative colitis Neg Hx   . Uterine cancer Neg Hx   . Wilson's disease Neg Hx     Review of Systems  Constitutional: Negative for appetite change, fatigue, fever and unexpected weight change.       ? Hot flashes at night a few times a week  Respiratory: Positive for cough. Negative for shortness of breath and wheezing.   Cardiovascular: Negative for chest pain, palpitations and  leg swelling.  Gastrointestinal: Positive for abdominal pain (intermittent, chronic).       No gerd  Neurological: Negative for dizziness, light-headedness and headaches.       Objective:   Vitals:    08/03/16 1332  BP: (!) 104/58  Pulse: 84  Resp: 16  Temp: 97.6 F (36.4 C)   Filed Weights   08/03/16 1332  Weight: 127 lb (57.6 kg)   Body mass index is 24.8 kg/m.   Physical Exam  Constitutional: She appears well-developed and well-nourished. No distress.  HENT:  Head: Normocephalic and atraumatic.  Neck: Neck supple. No tracheal deviation present. No thyromegaly present.  Cardiovascular: Normal rate, regular rhythm and normal heart sounds.   No murmur heard. No carotid bruit  Pulmonary/Chest: Effort normal and breath sounds normal. No respiratory distress. She has no wheezes. She has no rales.  Musculoskeletal: She exhibits no edema.  Lymphadenopathy:    She has no cervical adenopathy.  Skin: Skin is warm and dry. She is not diaphoretic.  Psychiatric: She has a normal mood and affect. Her behavior is normal.          Assessment & Plan:   See Problem List for Assessment and Plan of chronic medical problems.   F/u in 6 months

## 2016-08-03 NOTE — Assessment & Plan Note (Signed)
Check tsh  Titrate med dose if needed  

## 2016-08-03 NOTE — Patient Instructions (Signed)
  Test(s) ordered today. Your results will be released to MyChart (or called to you) after review, usually within 72hours after test completion. If any changes need to be made, you will be notified at that same time.  No immunizations administered today.   Medications reviewed and updated.  No changes recommended at this time.  Please followup in 6 months   

## 2016-08-03 NOTE — Assessment & Plan Note (Signed)
She admits to memory difficulties, but deferred further evaluation with neurology

## 2016-08-03 NOTE — Assessment & Plan Note (Signed)
GERD controlled Continue daily medication  

## 2016-08-03 NOTE — Assessment & Plan Note (Signed)
Continue trazodone 

## 2016-08-03 NOTE — Assessment & Plan Note (Signed)
Check a1c Compliant with a diabetic diet

## 2016-08-03 NOTE — Assessment & Plan Note (Signed)
BP well controlled Current regimen effective and well tolerated Continue current medications at current doses Check labs

## 2016-08-03 NOTE — Progress Notes (Signed)
Pre visit review using our clinic review tool, if applicable. No additional management support is needed unless otherwise documented below in the visit note. 

## 2016-08-03 NOTE — Assessment & Plan Note (Signed)
Check lipid panel, continue lipitor 80 mg daily

## 2016-08-05 ENCOUNTER — Telehealth: Payer: Self-pay | Admitting: Cardiology

## 2016-08-05 ENCOUNTER — Ambulatory Visit (INDEPENDENT_AMBULATORY_CARE_PROVIDER_SITE_OTHER): Payer: Medicare Other | Admitting: *Deleted

## 2016-08-05 DIAGNOSIS — I495 Sick sinus syndrome: Secondary | ICD-10-CM

## 2016-08-05 NOTE — Telephone Encounter (Signed)
LMOVM reminding pt to send remote transmission.   

## 2016-08-06 ENCOUNTER — Encounter: Payer: Self-pay | Admitting: Cardiology

## 2016-08-06 NOTE — Progress Notes (Signed)
Remote pacemaker transmission.   

## 2016-08-10 LAB — CUP PACEART REMOTE DEVICE CHECK
Brady Statistic AP VS Percent: 97 %
Brady Statistic AS VP Percent: 0 %
Brady Statistic AS VS Percent: 3 %
Implantable Lead Implant Date: 20130801
Implantable Lead Implant Date: 20130801
Implantable Lead Location: 753859
Implantable Lead Location: 753860
Implantable Lead Model: 5076
Lead Channel Impedance Value: 582 Ohm
Lead Channel Pacing Threshold Amplitude: 0.875 V
Lead Channel Pacing Threshold Amplitude: 1 V
Lead Channel Pacing Threshold Pulse Width: 0.4 ms
Lead Channel Pacing Threshold Pulse Width: 0.4 ms
Lead Channel Setting Sensing Sensitivity: 5.6 mV
MDC IDC MSMT BATTERY IMPEDANCE: 181 Ohm
MDC IDC MSMT BATTERY REMAINING LONGEVITY: 121 mo
MDC IDC MSMT BATTERY VOLTAGE: 2.79 V
MDC IDC MSMT LEADCHNL RA IMPEDANCE VALUE: 471 Ohm
MDC IDC SESS DTM: 20170831180542
MDC IDC SET LEADCHNL RA PACING AMPLITUDE: 2 V
MDC IDC SET LEADCHNL RV PACING AMPLITUDE: 2.5 V
MDC IDC SET LEADCHNL RV PACING PULSEWIDTH: 0.4 ms
MDC IDC STAT BRADY AP VP PERCENT: 0 %

## 2016-08-31 ENCOUNTER — Other Ambulatory Visit: Payer: Self-pay | Admitting: Neurology

## 2016-09-02 DIAGNOSIS — Z85828 Personal history of other malignant neoplasm of skin: Secondary | ICD-10-CM | POA: Diagnosis not present

## 2016-09-02 DIAGNOSIS — D692 Other nonthrombocytopenic purpura: Secondary | ICD-10-CM | POA: Diagnosis not present

## 2016-09-02 DIAGNOSIS — L570 Actinic keratosis: Secondary | ICD-10-CM | POA: Diagnosis not present

## 2016-09-10 ENCOUNTER — Other Ambulatory Visit: Payer: Self-pay | Admitting: Cardiovascular Disease

## 2016-09-29 ENCOUNTER — Other Ambulatory Visit: Payer: Self-pay | Admitting: Internal Medicine

## 2016-10-15 DIAGNOSIS — Z23 Encounter for immunization: Secondary | ICD-10-CM | POA: Diagnosis not present

## 2016-11-04 ENCOUNTER — Ambulatory Visit (INDEPENDENT_AMBULATORY_CARE_PROVIDER_SITE_OTHER): Payer: Medicare Other | Admitting: *Deleted

## 2016-11-04 DIAGNOSIS — I495 Sick sinus syndrome: Secondary | ICD-10-CM

## 2016-11-05 ENCOUNTER — Telehealth: Payer: Self-pay | Admitting: Cardiology

## 2016-11-05 NOTE — Telephone Encounter (Signed)
Confirmed remote transmission w/ pt husband.   

## 2016-11-12 NOTE — Progress Notes (Signed)
Remote pacemaker transmission.   

## 2016-11-16 ENCOUNTER — Encounter: Payer: Self-pay | Admitting: Cardiology

## 2016-11-17 ENCOUNTER — Encounter: Payer: Self-pay | Admitting: Adult Health

## 2016-11-17 ENCOUNTER — Ambulatory Visit (INDEPENDENT_AMBULATORY_CARE_PROVIDER_SITE_OTHER): Payer: Medicare Other | Admitting: Adult Health

## 2016-11-17 VITALS — BP 106/64 | HR 87 | Wt 129.6 lb

## 2016-11-17 DIAGNOSIS — G3184 Mild cognitive impairment, so stated: Secondary | ICD-10-CM | POA: Diagnosis not present

## 2016-11-17 DIAGNOSIS — I200 Unstable angina: Secondary | ICD-10-CM | POA: Diagnosis not present

## 2016-11-17 DIAGNOSIS — G4700 Insomnia, unspecified: Secondary | ICD-10-CM | POA: Diagnosis not present

## 2016-11-17 MED ORDER — TRAZODONE HCL 50 MG PO TABS: 50.0000 mg | ORAL_TABLET | Freq: Every evening | ORAL | 3 refills | Status: DC | PRN

## 2016-11-17 NOTE — Progress Notes (Signed)
I agree with the assessment and plan as directed by NP .The patient is known to me .   Davion Flannery, MD  

## 2016-11-17 NOTE — Progress Notes (Signed)
PATIENT: Janet Steele DOB: 1929/02/28  REASON FOR VISIT: follow up-insomnia, mild cognitive impairment HISTORY FROM: patient  HISTORY OF PRESENT ILLNESS: [name] is an 82 year old female with a history of insomnia and mild cognitive impairment. She returns today for follow-up. She reports that she continues to use trazodone with good benefit. She states that some nights she does have a hard time going to sleep but overall it works well for her. She states at home she is able to complete all ADLs independently. She operates a Teacher, music without difficulty. She states that she has only gotten lost on one occasion. She states her husband completes all the finances. She still prepare some meals. Denies any difficulty with this task. Overall she feels that her memory may have gotten slightly better. She does have impaired hearing. She returns today for an evaluation.  HISTORY Per Dr. Edwena Felty notes: 11-18-15  The patient reports that her sleep has been doing well she feels that her insomnia has been well controlled.  She feels that she is getting more forgetfull.  She has no recent bouts of angina and she states she doesn't have HTN. She requested me to go through her medication list with her, she is taking losartan for high blood pressure but feels that she does not have high blood pressure.  Nitroglycerin for angina, and omeprazole for gastric reflux, Ranexa, tramadol for back pain as needed, trazodone 50 mg for sleep. She reports her PCP, Dr. Linna Darner , has retired and she is concerned about finding a new one.   She wrote a short note under her memory tests stating that she does not like this test!. She could not copy an image the interlocking pentagons were not meeting her clinical fluency test was only 5 points the clock face however was 4 out of 4 points. She sees Dr. Redmond Pulling for diabetes which is well controlled. She also takes thyroid medication.   HISTORY [facility]): Ms.  Janet Steele is an 82 year old female with a history of chronic insomnia and chronic motor tic. She returns today for follow-up. She continues to take trazodone and tolerates it well. She reports that her motor tics have remained stable however they still bother her. She reports that she uses Valium for anxiety however she rarely takes this medication according to the patient. Patient reports that this week she woke up with back pain-- this has been chronic. She plans to follow-up with Dr. Ellene Route for epidural steroid injections. She states that she did take medication for her back pain however she does not remember what the medication was. She is on Ultram for pain however she states that was not the medication she took. The patient refuses to complete the memory tests stating that the medication made her feel a little off. She states that she had to have her husband drive her for that reason. She returns today for an evaluation.   REVIEW OF SYSTEMS: Out of a complete 14 system review of symptoms, the patient complains only of the following symptoms, and all other reviewed systems are negative. See history of present illness  ALLERGIES: Allergies  Allergen Reactions  . Morphine Hives, Itching and Rash  . Penicillins Itching and Rash    .   Marland Kitchen Prednisone Other (See Comments)    MEMORY LOSS   . Sulfonamide Derivatives Itching and Rash  . Codeine Rash  . Hydrocodone-Acetaminophen Other (See Comments)    unknown  . Meperidine Hcl Itching  . Metoclopramide Hcl Itching and  Rash  . Metoprolol Succinate Other (See Comments)    nervous  . Moxifloxacin Itching  . Oxycodone-Aspirin Rash  . Pentazocine Lactate Nausea Only  . Pioglitazone Other (See Comments)    bloating    HOME MEDICATIONS: Outpatient Medications Prior to Visit  Medication Sig Dispense Refill  . amLODipine (NORVASC) 5 MG tablet Take 1 tablet (5 mg total) by mouth daily. 30 tablet 6  . aspirin 81 MG EC tablet Take 1 tablet (81 mg total)  by mouth daily. 30 tablet 11  . atorvastatin (LIPITOR) 80 MG tablet take 1 tablet by mouth once daily AT 6 PM 30 tablet 7  . clopidogrel (PLAVIX) 75 MG tablet Take 1 tablet (75 mg total) by mouth daily. 30 tablet 6  . DULoxetine (CYMBALTA) 60 MG capsule take 1 capsule by mouth once daily 90 capsule 1  . gabapentin (NEURONTIN) 100 MG capsule Take 100 mg by mouth 3 (three) times daily.    Marland Kitchen glimepiride (AMARYL) 2 MG tablet Take 2 mg by mouth daily before breakfast.     . levothyroxine (SYNTHROID, LEVOTHROID) 125 MCG tablet Take 1 tablet (125 mcg total) by mouth daily. 90 tablet 1  . losartan (COZAAR) 50 MG tablet Take 1 tablet (50 mg total) by mouth daily. 30 tablet 5  . metFORMIN (GLUCOPHAGE) 500 MG tablet Take 1 tablet (500 mg total) by mouth daily with breakfast. 90 tablet 3  . metoprolol tartrate (LOPRESSOR) 25 MG tablet Take 1 tablet (25 mg total) by mouth 2 (two) times daily. 60 tablet 6  . nitroGLYCERIN (NITROSTAT) 0.4 MG SL tablet Place 1 tablet (0.4 mg total) under the tongue every 5 (five) minutes as needed. For chest pain 25 tablet 5  . pantoprazole (PROTONIX) 40 MG tablet Take 1 tablet (40 mg total) by mouth 2 (two) times daily. 60 tablet 6  . traMADol (ULTRAM) 50 MG tablet Take 50 mg by mouth every 6 (six) hours as needed for moderate pain (back pain).   0  . traZODone (DESYREL) 50 MG tablet take 1 tablet by mouth at bedtime if needed for sleep 145 tablet 1   No facility-administered medications prior to visit.     PAST MEDICAL HISTORY: Past Medical History:  Diagnosis Date  . Anemia   . Anxiety   . Arthritis    "fingers" (05/19/2016  . CAD (coronary artery disease)    a. s/p CABG 1982 b. redo CABG 2003. c. [place] despite med rx 05/2016: successful but complicated PCI of PDA beyond graft site, c/b localized dissection requiring overlapping stent.  . Carotid bruit    a. Duplex 01/2015: patent vessels, 1-39% BICA, f/u PRN recommened.  . Colon polyp    adenomatous  . Colon,  diverticulosis   . Degenerative joint disease   . Diastolic dysfunction   . Esophageal stricture   . GERD (gastroesophageal reflux disease)   . Hiatal hernia   . History of blood transfusion    "related to my bypass"  . Hyperlipidemia   . Hypertension   . Hypothyroidism   . Ischemic cardiomyopathy    a. Cath 05/2016: EF 40% with severe inferior wall HK, suspect hibernating myocardium.  . Myocardial infarction 1977; 1982  . Osteoarthrosis, unspecified whether generalized or localized, unspecified site   . Presence of permanent cardiac pacemaker   . PVD (peripheral vascular disease) ([place])   . Type II diabetes mellitus ([place])   . Unspecified hearing loss     PAST SURGICAL HISTORY: Past Surgical History:  Procedure  Laterality Date  . ABDOMINAL HYSTERECTOMY    . APPENDECTOMY    . BACK SURGERY    . CARDIAC CATHETERIZATION N/A 05/19/2016   Procedure: Left Heart Cath and Cors/Grafts Angiography;  Surgeon: Belva Crome, MD;  Location: [place] [facility];  Service: Cardiovascular;  Laterality: N/A;  . CARDIAC CATHETERIZATION N/A 05/19/2016   Procedure: Coronary Stent Intervention;  Surgeon: Belva Crome, MD;  Location: [place] [facility];  Service: Cardiovascular;  Laterality: N/A;  . CATARACT EXTRACTION W/ INTRAOCULAR LENS  IMPLANT, BILATERAL Bilateral   . CHOLECYSTECTOMY OPEN    . COLONOSCOPY    . CORONARY ANGIOPLASTY WITH STENT PLACEMENT  05/19/2016   "2 stents?"  . CORONARY ARTERY BYPASS GRAFT  1982; 2003   x4(1982),02-2002 CABG X2  . ESOPHAGOGASTRODUODENOSCOPY (EGD) WITH ESOPHAGEAL DILATION  "2-3 times"  . INSERT / REPLACE / REMOVE PACEMAKER    . LEFT HEART CATHETERIZATION WITH CORONARY/GRAFT ANGIOGRAM N/A 03/05/2014   Procedure: LEFT HEART CATHETERIZATION WITH Beatrix Fetters;  Surgeon: Sinclair Grooms, MD;  Location: [facility];  Service: Cardiovascular;  Laterality: N/A;  . LUMBAR [place] SURGERY  01-2000  . OOPHORECTOMY Bilateral   . PERMANENT PACEMAKER INSERTION N/A  07/06/2012   MDT Adapta L implanted by Dr Rayann Heman for symptomatic bradycardia  . TONSILLECTOMY  1930s    FAMILY HISTORY: Family History  Problem Relation Age of Onset  . Heart disease Sister   . Heart attack Sister   . Colon cancer Neg Hx   . Breast cancer Neg Hx   . Celiac disease Neg Hx   . Cirrhosis Neg Hx   . Clotting disorder Neg Hx   . Colitis Neg Hx   . Colon polyps Neg Hx   . Crohn's disease Neg Hx   . Cystic fibrosis Neg Hx   . Diabetes Neg Hx   . Esophageal cancer Neg Hx   . Hemochromatosis Neg Hx   . Inflammatory bowel disease Neg Hx   . Irritable bowel syndrome Neg Hx   . Kidney disease Neg Hx   . Liver cancer Neg Hx   . Liver disease Neg Hx   . Ovarian cancer Neg Hx   . Pancreatic cancer Neg Hx   . Prostate cancer Neg Hx   . Rectal cancer Neg Hx   . Stomach cancer Neg Hx   . Ulcerative colitis Neg Hx   . Uterine cancer Neg Hx   . Wilson's disease Neg Hx     SOCIAL HISTORY: Social History   Social History  . Marital status: Married    Spouse name: [name]  . Number of children: 3  . Years of education: 16   Occupational History  . Retired    .  Retired   Social History Main Topics  . Smoking status: Former Smoker    Packs/day: 0.50    Years: 30.00    Types: Cigarettes    Quit date: 07/05/1976  . Smokeless tobacco: Never Used  . Alcohol use 1.8 oz/week    3 Glasses of wine per week  . Drug use: No  . Sexual activity: No   Other Topics Concern  . Not on file   Social History Narrative   Patient is married [name]) and lives at home with her husband.   Patient has three adult children.   Patient is retired.   Patient has a college education.   Patient is right-handed.   Patient does not drink any caffeine.  PHYSICAL EXAM  Vitals:   11/17/16 1536  BP: 106/64  Pulse: 87  Weight: 129 lb 9.6 oz (58.8 kg)   Body mass index is 25.31 kg/m.   MMSE - Mini Mental State Exam 11/17/2016 11/18/2015 11/06/2015  Not completed: - -  Unable to complete  Orientation to time 4 4 -  Orientation to Place 5 5 -  Registration 3 3 -  Attention/ Calculation 5 5 -  Recall 2 0 -  Language- name 2 objects 2 2 -  Language- repeat 0 0 -  Language- follow 3 step command 3 3 -  Language- read & follow direction 1 0 -  Language-read & follow direction-comments - PT read the sentence twice but did not perform the task. -  Write a sentence 1 1 -  Copy design 1 0 -  Total score 27 23 -     Generalized: Well developed, in no acute distress   Neurological examination  Mentation: Alert oriented to time, place, history taking. Follows all commands speech and language fluent Cranial nerve II-XII: Pupils were equal round reactive to light. Extraocular movements were full, visual field were full on confrontational test. Facial sensation and strength were normal. Uvula tongue midline. Head turning and shoulder shrug  were normal and symmetric. Motor: The motor testing reveals 5 over 5 strength of all 4 extremities. Good symmetric motor tone is noted throughout.  Sensory: Sensory testing is intact to soft touch on all 4 extremities. No evidence of extinction is noted.  Coordination: Cerebellar testing reveals good finger-nose-finger and heel-to-shin bilaterally.  Gait and station: Gait is normal. Tandem gait is normal. Romberg is negative. No drift is seen.  Reflexes: Deep tendon reflexes are symmetric and normal bilaterally.   DIAGNOSTIC DATA (LABS, IMAGING, TESTING) - I reviewed patient records, labs, notes, testing and imaging myself where available.  Lab Results  Component Value Date   WBC 6.9 05/25/2016   HGB 13.6 05/25/2016   HCT 39.7 05/25/2016   MCV 83.2 05/25/2016   PLT 359 05/25/2016      Component Value Date/Time   NA 137 08/03/2016 1418   K 4.6 08/03/2016 1418   CL 102 08/03/2016 1418   CO2 31 08/03/2016 1418   GLUCOSE 165 (H) 08/03/2016 1418   BUN 19 08/03/2016 1418   CREATININE 1.06 08/03/2016 1418   CREATININE  0.91 (H) 05/25/2016 1027   CALCIUM 9.0 08/03/2016 1418   PROT 7.0 08/03/2016 1418   ALBUMIN 4.0 08/03/2016 1418   AST 18 08/03/2016 1418   ALT 18 08/03/2016 1418   ALKPHOS 99 08/03/2016 1418   BILITOT 0.7 08/03/2016 1418   GFRNONAA 59 (L) 05/20/2016 0313   GFRAA >60 05/20/2016 0313   Lab Results  Component Value Date   CHOL 267 (H) 08/03/2016   HDL 31.00 (L) 08/03/2016   LDLCALC 143 (H) 05/15/2014   LDLDIRECT 173.0 08/03/2016   TRIG 338.0 (H) 08/03/2016   CHOLHDL 9 08/03/2016   Lab Results  Component Value Date   HGBA1C 7.9 (H) 08/03/2016   Lab Results  Component Value Date   VITAMINB12 437 11/12/2011   Lab Results  Component Value Date   TSH 0.97 08/03/2016      ASSESSMENT AND PLAN 82 y.o. year old female  has a past medical history of Anemia; Anxiety; Arthritis; CAD (coronary artery disease); Carotid bruit; Colon polyp; Colon, diverticulosis; Degenerative joint disease; Diastolic dysfunction; Esophageal stricture; GERD (gastroesophageal reflux disease); Hiatal hernia; History of blood transfusion; Hyperlipidemia; Hypertension; Hypothyroidism; Ischemic cardiomyopathy;  Myocardial infarction (7357; 1982); Osteoarthrosis, unspecified whether generalized or localized, unspecified site; Presence of permanent cardiac pacemaker; PVD (peripheral vascular disease) ([place]); Type II diabetes mellitus ([place]); and Unspecified hearing loss. here with:  1. Insomnia 2. Mild cognitive impairment  The patient will continue on trazodone. I will refill this today. The patient's memory score has improved. We will continue to monitor. Patient advised that if her symptoms worsen or she develops any new symptoms she should let us know. Follow-up in one year or sooner if needed.     Ward Givens, MSN, NP-C 11/17/2016, 3:54 PM [facility] [address] [place], [place] [address] [phone]

## 2016-11-17 NOTE — Progress Notes (Signed)
I agree with the assessment and plan as directed by NP .The patient is known to me .   Emberlin Verner, MD  

## 2016-11-17 NOTE — Patient Instructions (Signed)
Memory score is stable Continue trazodone If your symptoms worsen or you develop new symptoms please let us know.

## 2016-12-03 DIAGNOSIS — Z85828 Personal history of other malignant neoplasm of skin: Secondary | ICD-10-CM | POA: Diagnosis not present

## 2016-12-03 DIAGNOSIS — S80811S Abrasion, right lower leg, sequela: Secondary | ICD-10-CM | POA: Diagnosis not present

## 2016-12-07 LAB — CUP PACEART REMOTE DEVICE CHECK
Battery Voltage: 2.79 V
Brady Statistic AP VS Percent: 97 %
Date Time Interrogation Session: 20171201175738
Implantable Lead Implant Date: 20130801
Implantable Lead Location: 753860
Lead Channel Pacing Threshold Pulse Width: 0.4 ms
Lead Channel Pacing Threshold Pulse Width: 0.4 ms
Lead Channel Setting Pacing Amplitude: 2.25 V
Lead Channel Setting Pacing Pulse Width: 0.4 ms
MDC IDC LEAD IMPLANT DT: 20130801
MDC IDC LEAD LOCATION: 753859
MDC IDC MSMT BATTERY IMPEDANCE: 204 Ohm
MDC IDC MSMT BATTERY REMAINING LONGEVITY: 116 mo
MDC IDC MSMT LEADCHNL RA IMPEDANCE VALUE: 492 Ohm
MDC IDC MSMT LEADCHNL RA PACING THRESHOLD AMPLITUDE: 1.125 V
MDC IDC MSMT LEADCHNL RV IMPEDANCE VALUE: 595 Ohm
MDC IDC MSMT LEADCHNL RV PACING THRESHOLD AMPLITUDE: 1 V
MDC IDC PG IMPLANT DT: 20130801
MDC IDC SET LEADCHNL RV PACING AMPLITUDE: 2.5 V
MDC IDC SET LEADCHNL RV SENSING SENSITIVITY: 5.6 mV
MDC IDC STAT BRADY AP VP PERCENT: 0 %
MDC IDC STAT BRADY AS VP PERCENT: 0 %
MDC IDC STAT BRADY AS VS PERCENT: 3 %

## 2016-12-09 DIAGNOSIS — S80811S Abrasion, right lower leg, sequela: Secondary | ICD-10-CM | POA: Diagnosis not present

## 2016-12-09 DIAGNOSIS — Z85828 Personal history of other malignant neoplasm of skin: Secondary | ICD-10-CM | POA: Diagnosis not present

## 2016-12-16 DIAGNOSIS — Z85828 Personal history of other malignant neoplasm of skin: Secondary | ICD-10-CM | POA: Diagnosis not present

## 2016-12-16 DIAGNOSIS — S80811S Abrasion, right lower leg, sequela: Secondary | ICD-10-CM | POA: Diagnosis not present

## 2016-12-17 ENCOUNTER — Encounter: Payer: Self-pay | Admitting: Internal Medicine

## 2016-12-17 ENCOUNTER — Ambulatory Visit (INDEPENDENT_AMBULATORY_CARE_PROVIDER_SITE_OTHER): Payer: Medicare Other | Admitting: Internal Medicine

## 2016-12-17 ENCOUNTER — Other Ambulatory Visit (INDEPENDENT_AMBULATORY_CARE_PROVIDER_SITE_OTHER): Payer: Medicare Other

## 2016-12-17 VITALS — BP 118/70 | HR 80 | Temp 98.1°F | Resp 16 | Wt 130.0 lb

## 2016-12-17 DIAGNOSIS — E1165 Type 2 diabetes mellitus with hyperglycemia: Secondary | ICD-10-CM

## 2016-12-17 DIAGNOSIS — E039 Hypothyroidism, unspecified: Secondary | ICD-10-CM

## 2016-12-17 DIAGNOSIS — R202 Paresthesia of skin: Secondary | ICD-10-CM

## 2016-12-17 DIAGNOSIS — I10 Essential (primary) hypertension: Secondary | ICD-10-CM

## 2016-12-17 DIAGNOSIS — R413 Other amnesia: Secondary | ICD-10-CM

## 2016-12-17 DIAGNOSIS — K219 Gastro-esophageal reflux disease without esophagitis: Secondary | ICD-10-CM

## 2016-12-17 DIAGNOSIS — N3281 Overactive bladder: Secondary | ICD-10-CM

## 2016-12-17 DIAGNOSIS — F419 Anxiety disorder, unspecified: Secondary | ICD-10-CM

## 2016-12-17 DIAGNOSIS — E1142 Type 2 diabetes mellitus with diabetic polyneuropathy: Secondary | ICD-10-CM

## 2016-12-17 DIAGNOSIS — G3184 Mild cognitive impairment, so stated: Secondary | ICD-10-CM

## 2016-12-17 DIAGNOSIS — G479 Sleep disorder, unspecified: Secondary | ICD-10-CM

## 2016-12-17 LAB — COMPREHENSIVE METABOLIC PANEL
ALBUMIN: 3.9 g/dL (ref 3.5–5.2)
ALBUMIN: 4 g/dL (ref 3.5–5.2)
ALK PHOS: 106 U/L (ref 39–117)
ALK PHOS: 109 U/L (ref 39–117)
ALT: 14 U/L (ref 0–35)
ALT: 14 U/L (ref 0–35)
AST: 16 U/L (ref 0–37)
AST: 16 U/L (ref 0–37)
BUN: 17 mg/dL (ref 6–23)
BUN: 17 mg/dL (ref 6–23)
CALCIUM: 9.7 mg/dL (ref 8.4–10.5)
CO2: 29 mEq/L (ref 19–32)
CO2: 29 mEq/L (ref 19–32)
Calcium: 9.5 mg/dL (ref 8.4–10.5)
Chloride: 99 mEq/L (ref 96–112)
Chloride: 99 mEq/L (ref 96–112)
Creatinine, Ser: 1.05 mg/dL (ref 0.40–1.20)
Creatinine, Ser: 1.06 mg/dL (ref 0.40–1.20)
GFR: 52.92 mL/min — AB (ref >60.00)
GFR: 52.34 mL/min — ABNORMAL LOW (ref >60.00)
GLUCOSE: 208 mg/dL — AB (ref 70–99)
GLUCOSE: 210 mg/dL — AB (ref 70–99)
POTASSIUM: 4.6 meq/L (ref 3.5–5.1)
Potassium: 4.6 mEq/L (ref 3.5–5.1)
SODIUM: 135 meq/L (ref 135–145)
Sodium: 136 mEq/L (ref 135–145)
TOTAL PROTEIN: 6.9 g/dL (ref 6.0–8.3)
TOTAL PROTEIN: 6.9 g/dL (ref 6.0–8.3)
Total Bilirubin: 0.5 mg/dL (ref 0.2–1.2)
Total Bilirubin: 0.6 mg/dL (ref 0.2–1.2)

## 2016-12-17 LAB — LIPID PANEL
CHOLESTEROL: 252 mg/dL — AB (ref 0–200)
HDL: 33.7 mg/dL — AB (ref >39.00)
LDL Cholesterol: 178 mg/dL — ABNORMAL HIGH (ref 0–99)
NonHDL: 217.81
TRIGLYCERIDES: 200 mg/dL — AB (ref 0.0–149.0)
Total CHOL/HDL Ratio: 7
VLDL: 40 mg/dL (ref 0.0–40.0)

## 2016-12-17 LAB — TSH: TSH: 27.95 u[IU]/mL — AB (ref 0.35–4.50)

## 2016-12-17 LAB — MAGNESIUM: MAGNESIUM: 1.7 mg/dL (ref 1.5–2.5)

## 2016-12-17 LAB — VITAMIN B12: Vitamin B-12: 519 pg/mL (ref 211–911)

## 2016-12-17 LAB — HEMOGLOBIN A1C: HEMOGLOBIN A1C: 8.7 % — AB (ref 4.6–6.5)

## 2016-12-17 MED ORDER — MIRABEGRON ER 25 MG PO TB24
25.0000 mg | ORAL_TABLET | Freq: Every day | ORAL | 5 refills | Status: DC
Start: 2016-12-17 — End: 2017-01-04

## 2016-12-17 NOTE — Assessment & Plan Note (Addendum)
Check a1c Continue metformin at current dose ? True compliance with a diabetic diet, no exericse F/u in 3 months

## 2016-12-17 NOTE — Assessment & Plan Note (Addendum)
Feels memory is getting better - memory testing better last month when she saw neuro Today her memory seems poor Month and year  - January, 2018   0/2 WORLD, backwards - unable to do Count backwards by 3 from 100 - no I can't do that Draw clock - not correct x 1, incomplete x 1  Sees neurology once a year Check basic labs

## 2016-12-17 NOTE — Patient Instructions (Signed)
Have blood work done today.  Test(s) ordered today. Your results will be released to [facility] (or called to you) after review, usually within 72hours after test completion. If any changes need to be made, you will be notified at that same time.   Medications reviewed and updated.  Changes include starting myrbetriq for your overactive bladder.  If this does not work or if you have side effects, please stop it.   Your prescription(s) have been submitted to your pharmacy. Please take as directed and contact our office if you believe you are having problem(s) with the medication(s).  Your memory was evaluated today.  Your neurologist also evaluated it last month - they thought your memory was better, but I think it is worse.  Continue routine follow up with your neurologist to monitor this.   Please followup in 3 months

## 2016-12-17 NOTE — Progress Notes (Signed)
Subjective:    Patient ID: Janet Steele, female    DOB: [birth year], 83 y.o.   MRN: [id number]  HPI The patient is here for follow up.  Memory concerns:  Her husband is in the waiting room.  I saw him earlier this week and he is very concerned about her memory.  She has short term memory problems -forgets day of the week, asks where they are when they are at home, etc.  She does have a diagnosis of cognitive impairment that has been monitoring by neurology.  She saw them last month and on their test her memory was better.  She feels her memory is getting better.    Diabetes: She is taking her medication daily as prescribed. She is compliant with a diabetic diet. She is not exercising regularly. She checks her feet daily and denies foot lesions. She is up-to-date with an ophthalmology examination.   Hypothyroidism:  She is taking her medication daily.  She denies any recent changes in energy or weight that are unexplained.   GERD:  She is taking her medication daily as prescribed.  She denies any GERD symptoms and feels her GERD is well controlled.   Overactive bladder:  She urinates frequently.  She was on a bladder medication in the past and would like to restart a medication.  It bothers her mostly at night.   Medications and allergies reviewed with patient and updated if appropriate.  Patient Active Problem List   Diagnosis Date Noted  . Overactive bladder 12/17/2016  . Anemia 05/20/2016  . Coronary artery disease involving coronary bypass graft of native heart without angina pectoris 05/19/2016  . Numbness and tingling of foot 12/31/2015  . MCI (mild cognitive impairment) with memory loss 11/18/2015  . Facial tic 05/07/2015  . Facial nerve spasm 05/07/2015  . Sick sinus syndrome ([place]) 02/26/2015  . Cervical dystonia 12/04/2014  . Chronic motor tic 09/12/2014  . Nonspecific abnormal electrocardiogram (ECG) (EKG)   . Unstable angina ([place]) 03/02/2014  . Pacemaker-Medtronic  07/07/2012  . Bradycardia 07/05/2012  . GERD (gastroesophageal reflux disease) 07/05/2012  . Near syncope 12/23/2011  . Abnormal involuntary movements(781.0) 12/17/2009  . Disturbance in sleep behavior 12/11/2009  . CAD, ARTERY BYPASS GRAFT 09/24/2009  . CAROTID BRUIT 09/24/2009  . Diabetes ([place]) 01/30/2009  . DYSPHAGIA 06/19/2008  . Anxiety 02/14/2008  . EROSIVE ESOPHAGITIS 02/14/2008  . CONSTIPATION, CHRONIC 02/14/2008  . DEGENERATIVE JOINT DISEASE 02/14/2008  . HEARING LOSS 01/04/2008  . PVD 01/04/2008  . Hypothyroidism 09/25/2007  . HYPERLIPIDEMIA 09/25/2007  . Essential hypertension 09/07/2007  . HYPERTRIGLYCERIDEMIA 03/16/2007  . Diabetic polyneuropathy ([place]) 03/16/2007  . Stricture and stenosis of esophagus 03/16/2007  . HIATAL HERNIA 02/16/2007  . HEMORRHOIDS 12/31/1997  . DIVERTICULOSIS, COLON 12/31/1997    Current Outpatient Prescriptions on File Prior to Visit  Medication Sig Dispense Refill  . amLODipine (NORVASC) 5 MG tablet Take 1 tablet (5 mg total) by mouth daily. 30 tablet 6  . aspirin 81 MG EC tablet Take 1 tablet (81 mg total) by mouth daily. 30 tablet 11  . atorvastatin (LIPITOR) 80 MG tablet take 1 tablet by mouth once daily AT 6 PM 30 tablet 7  . clopidogrel (PLAVIX) 75 MG tablet Take 1 tablet (75 mg total) by mouth daily. 30 tablet 6  . DULoxetine (CYMBALTA) 60 MG capsule take 1 capsule by mouth once daily 90 capsule 1  . gabapentin (NEURONTIN) 100 MG capsule Take 100 mg by mouth 3 (three) times daily.    Marland Kitchen  glimepiride (AMARYL) 2 MG tablet Take 2 mg by mouth daily before breakfast.     . levothyroxine (SYNTHROID, LEVOTHROID) 125 MCG tablet Take 1 tablet (125 mcg total) by mouth daily. 90 tablet 1  . losartan (COZAAR) 50 MG tablet Take 1 tablet (50 mg total) by mouth daily. 30 tablet 5  . metFORMIN (GLUCOPHAGE) 500 MG tablet Take 1 tablet (500 mg total) by mouth daily with breakfast. 90 tablet 3  . metoprolol tartrate (LOPRESSOR) 25 MG tablet Take 1 tablet  (25 mg total) by mouth 2 (two) times daily. 60 tablet 6  . nitroGLYCERIN (NITROSTAT) 0.4 MG SL tablet Place 1 tablet (0.4 mg total) under the tongue every 5 (five) minutes as needed. For chest pain 25 tablet 5  . pantoprazole (PROTONIX) 40 MG tablet Take 1 tablet (40 mg total) by mouth 2 (two) times daily. 60 tablet 6  . traMADol (ULTRAM) 50 MG tablet Take 50 mg by mouth every 6 (six) hours as needed for moderate pain (back pain).   0  . traZODone (DESYREL) 50 MG tablet Take 1 tablet (50 mg total) by mouth at bedtime as needed for sleep. 90 tablet 3   No current facility-administered medications on file prior to visit.     Past Medical History:  Diagnosis Date  . Anemia   . Anxiety   . Arthritis    "fingers" (05/19/2016  . CAD (coronary artery disease)    a. s/p CABG 1982 b. redo CABG 2003. c. [place] despite med rx 05/2016: successful but complicated PCI of PDA beyond graft site, c/b localized dissection requiring overlapping stent.  . Carotid bruit    a. Duplex 01/2015: patent vessels, 1-39% BICA, f/u PRN recommened.  . Colon polyp    adenomatous  . Colon, diverticulosis   . Degenerative joint disease   . Diastolic dysfunction   . Esophageal stricture   . GERD (gastroesophageal reflux disease)   . Hiatal hernia   . History of blood transfusion    "related to my bypass"  . Hyperlipidemia   . Hypertension   . Hypothyroidism   . Ischemic cardiomyopathy    a. Cath 05/2016: EF 40% with severe inferior wall HK, suspect hibernating myocardium.  . Myocardial infarction 1977; 1982  . Osteoarthrosis, unspecified whether generalized or localized, unspecified site   . Presence of permanent cardiac pacemaker   . PVD (peripheral vascular disease) ([place])   . Type II diabetes mellitus ([place])   . Unspecified hearing loss     Past Surgical History:  Procedure Laterality Date  . ABDOMINAL HYSTERECTOMY    . APPENDECTOMY    . BACK SURGERY    . CARDIAC CATHETERIZATION N/A 05/19/2016   Procedure:  Left Heart Cath and Cors/Grafts Angiography;  Surgeon: Belva Crome, MD;  Location: [facility];  Service: Cardiovascular;  Laterality: N/A;  . CARDIAC CATHETERIZATION N/A 05/19/2016   Procedure: Coronary Stent Intervention;  Surgeon: Belva Crome, MD;  Location: [facility];  Service: Cardiovascular;  Laterality: N/A;  . CATARACT EXTRACTION W/ INTRAOCULAR LENS  IMPLANT, BILATERAL Bilateral   . CHOLECYSTECTOMY OPEN    . COLONOSCOPY    . CORONARY ANGIOPLASTY WITH STENT PLACEMENT  05/19/2016   "2 stents?"  . CORONARY ARTERY BYPASS GRAFT  1982; 2003   x4(1982),02-2002 CABG X2  . ESOPHAGOGASTRODUODENOSCOPY (EGD) WITH ESOPHAGEAL DILATION  "2-3 times"  . INSERT / REPLACE / REMOVE PACEMAKER    . LEFT HEART CATHETERIZATION WITH CORONARY/GRAFT ANGIOGRAM N/A 03/05/2014   Procedure:  LEFT HEART CATHETERIZATION WITH Beatrix Fetters;  Surgeon: Sinclair Grooms, MD;  Location: [facility];  Service: Cardiovascular;  Laterality: N/A;  . LUMBAR [place] SURGERY  01-2000  . OOPHORECTOMY Bilateral   . PERMANENT PACEMAKER INSERTION N/A 07/06/2012   MDT Adapta L implanted by Dr Rayann Heman for symptomatic bradycardia  . TONSILLECTOMY  1930s    Social History   Social History  . Marital status: Married    Spouse name: [name]  . Number of children: 3  . Years of education: 16   Occupational History  . Retired    .  Retired   Social History Main Topics  . Smoking status: Former Smoker    Packs/day: 0.50    Years: 30.00    Types: Cigarettes    Quit date: 07/05/1976  . Smokeless tobacco: Never Used  . Alcohol use 1.8 oz/week    3 Glasses of wine per week  . Drug use: No  . Sexual activity: No   Other Topics Concern  . Not on file   Social History Narrative   Patient is married [name]) and lives at home with her husband.   Patient has three adult children.   Patient is retired.   Patient has a college education.   Patient is right-handed.   Patient does not drink any caffeine.      Family History  Problem Relation Age of Onset  . Heart disease Sister   . Heart attack Sister   . Colon cancer Neg Hx   . Breast cancer Neg Hx   . Celiac disease Neg Hx   . Cirrhosis Neg Hx   . Clotting disorder Neg Hx   . Colitis Neg Hx   . Colon polyps Neg Hx   . Crohn's disease Neg Hx   . Cystic fibrosis Neg Hx   . Diabetes Neg Hx   . Esophageal cancer Neg Hx   . Hemochromatosis Neg Hx   . Inflammatory bowel disease Neg Hx   . Irritable bowel syndrome Neg Hx   . Kidney disease Neg Hx   . Liver cancer Neg Hx   . Liver disease Neg Hx   . Ovarian cancer Neg Hx   . Pancreatic cancer Neg Hx   . Prostate cancer Neg Hx   . Rectal cancer Neg Hx   . Stomach cancer Neg Hx   . Ulcerative colitis Neg Hx   . Uterine cancer Neg Hx   . Wilson's disease Neg Hx     Review of Systems  Constitutional: Negative for chills, fatigue and fever.  Respiratory: Negative for cough, shortness of breath and wheezing.   Cardiovascular: Negative for chest pain and leg swelling.  Gastrointestinal: Positive for constipation. Negative for abdominal pain, diarrhea, nausea and vomiting.       Gerd controlled  Neurological: Negative for light-headedness and headaches.  Psychiatric/Behavioral: Negative for dysphoric mood and sleep disturbance. The patient is not nervous/anxious.        Objective:   Vitals:   12/17/16 1354  BP: 118/70  Pulse: 80  Resp: 16  Temp: 98.1 F (36.7 C)   Wt Readings from Last 3 Encounters:  12/17/16 130 lb (59 kg)  11/17/16 129 lb 9.6 oz (58.8 kg)  08/03/16 127 lb (57.6 kg)   Body mass index is 25.39 kg/m.   Physical Exam    Constitutional: Appears well-developed and well-nourished. No distress.  HENT:  Head: Normocephalic and atraumatic.  Neck: Neck supple. No tracheal deviation present. No thyromegaly  present.  No cervical lymphadenopathy Cardiovascular: Normal rate, regular rhythm and normal heart sounds.   No murmur heard. No carotid bruit .  No  edema Pulmonary/Chest: Effort normal and breath sounds normal. No respiratory distress. No has no wheezes. No rales.  Neuro; slow wide based gait Skin: Skin is warm and dry. Not diaphoretic.  right lower leg wrapped  Psychiatric: Normal mood and affect. Behavior is normal.      Assessment & Plan:    See Problem List for Assessment and Plan of chronic medical problems.   FU in 3 months

## 2016-12-17 NOTE — Assessment & Plan Note (Signed)
Controlled, stable Continue current dose of medication - cymbalta 

## 2016-12-17 NOTE — Progress Notes (Signed)
Pre visit review using our clinic review tool, if applicable. No additional management support is needed unless otherwise documented below in the visit note. 

## 2016-12-17 NOTE — Assessment & Plan Note (Signed)
Check tsh  Titrate med dose if needed  

## 2016-12-17 NOTE — Assessment & Plan Note (Signed)
Continue trazodone Works well, no side effects

## 2016-12-17 NOTE — Assessment & Plan Note (Addendum)
BP well controlled Current regimen effective and well tolerated Continue current medications at current doses cmp  

## 2016-12-17 NOTE — Assessment & Plan Note (Signed)
GERD controlled Continue daily medication  

## 2016-12-17 NOTE — Assessment & Plan Note (Signed)
Worse at night Has been on medication in the past Will try myrbetriq if covered since it will not affect her memory  - if it is not covered or does not work will not try anything else that will affect memory  FU in 3 months

## 2016-12-18 ENCOUNTER — Telehealth: Payer: Self-pay | Admitting: Internal Medicine

## 2016-12-18 NOTE — Telephone Encounter (Signed)
Please call and speak with her husband.  I need to know if she is taking her medication - if he is not sure he needs to start monitoring this.  If she does not have a weekly pill dispenser she needs one- this may help him keep on an eye on if she is taking her medication.     Her cholesterol is high, her thyroid is high - it does not looks like she is taking either.    Her liver tests are stable.  Kidney function is normal.    Her sugars are not controlled - we need to adjust her medication if she is taking her medication.   Her vitamin B12 level is good.      Let him know she is following with neurology for her memory - she does have a diagnosis of cognitive impairment.   When they tested her in December they felt her memory was better.  When I tested her last week she did not do well.  She does know she has memory issues, but feels it is getting better.

## 2016-12-20 NOTE — Telephone Encounter (Signed)
Tried contacting pt's husband on home number, unable to LVM. LVM on cell number.

## 2016-12-21 NOTE — Telephone Encounter (Signed)
Spoke with pt, states husband is not home. I will try at a later time.

## 2016-12-21 NOTE — Telephone Encounter (Signed)
Patient requesting call back in regard to dementia.  Spouse number is [phone]

## 2016-12-24 ENCOUNTER — Other Ambulatory Visit: Payer: Self-pay | Admitting: Emergency Medicine

## 2016-12-24 DIAGNOSIS — E1165 Type 2 diabetes mellitus with hyperglycemia: Secondary | ICD-10-CM

## 2016-12-24 DIAGNOSIS — E039 Hypothyroidism, unspecified: Secondary | ICD-10-CM

## 2016-12-24 DIAGNOSIS — E782 Mixed hyperlipidemia: Secondary | ICD-10-CM

## 2016-12-24 NOTE — Telephone Encounter (Signed)
Stop atorvastatin - start crestor 40 mg daily  Increase levothyroxine to 150 mcg daily  Increase metformin to 1000 mg twice daily.   Recheck cmp, lipid, tsh, a1c in  6 weeks   All pending if she has not had side effects from the above

## 2016-12-24 NOTE — Telephone Encounter (Signed)
Spoke with pts husband to inform. Also spoke with pt and encouraged her to you a pill box to separate her meds and make sure that she is taking them. Pt stated that she is taking her meds.

## 2016-12-24 NOTE — Telephone Encounter (Signed)
Pt states she is taking her meds and has one of the best systems she could possibly have. Please advise on medication changes.

## 2016-12-25 MED ORDER — ROSUVASTATIN CALCIUM 40 MG PO TABS: 40.0000 mg | ORAL_TABLET | Freq: Every day | ORAL | 1 refills | Status: DC

## 2016-12-25 MED ORDER — METFORMIN HCL 1000 MG PO TABS: 1000.0000 mg | ORAL_TABLET | Freq: Two times a day (BID) | ORAL | 1 refills | Status: DC

## 2016-12-25 MED ORDER — LEVOTHYROXINE SODIUM 150 MCG PO TABS: 150.0000 ug | ORAL_TABLET | Freq: Every day | ORAL | 1 refills | Status: DC

## 2016-12-25 NOTE — Telephone Encounter (Signed)
Spoke with husband and pt to inform of all changes.

## 2017-01-04 ENCOUNTER — Encounter: Payer: Self-pay | Admitting: Adult Health

## 2017-01-04 ENCOUNTER — Ambulatory Visit (INDEPENDENT_AMBULATORY_CARE_PROVIDER_SITE_OTHER): Payer: Medicare Other | Admitting: Adult Health

## 2017-01-04 VITALS — BP 100/64 | HR 88 | Ht 60.0 in | Wt 129.6 lb

## 2017-01-04 DIAGNOSIS — R413 Other amnesia: Secondary | ICD-10-CM

## 2017-01-04 MED ORDER — TRAZODONE HCL 50 MG PO TABS: 75.0000 mg | ORAL_TABLET | Freq: Every evening | ORAL | 11 refills | Status: DC | PRN

## 2017-01-04 NOTE — Patient Instructions (Signed)
Increase trazodone to 1.5 tablets at bedtime Engage in activities to prevent sleeping during the day If your symptoms worsen or you develop new symptoms please let us know.

## 2017-01-04 NOTE — Progress Notes (Signed)
PATIENT: Janet Steele DOB: 1929/07/18  REASON FOR VISIT: follow up- memory disturbance HISTORY FROM: patient  HISTORY OF PRESENT ILLNESS: Janet Steele is an 83 year old female with a history of memory disturbance. She returns today for follow-up. Her husband made this appointment after her memory was worse in the last month. The patient states that her memory has been fine. She states that she was placed on Keflex and while she was on this medication she did feel very strange. She finally stopped the medication & gave  back to Dr. Ronnald Ramp. She states that she is able to complete all ADLs independently. She operates a Teacher, music without difficulty. She states that she continues to have trouble falling asleep. She is currently taking trazodone 50 mg at bedtime. Reports good appetite. While the patient was in the room I called her husband. He states that she was acting confused while on Keflex. He states in the last 1-2 weeks this has improved. He states that while she was on this medication she would come to their bedroom and not know where she was. He [place] noticed that she was saying very odd things. He agrees that she is able to do all ADLs independently. He reports that he does all the cooking mainly because he likes to cook. He also notes that she has trouble sleeping at night but does report that she sleeps a lot during the day. She returns today for an evaluation.  HISTORY 11/17/16: Janet Steele is an 83 year old female with a history of insomnia and mild cognitive impairment. She returns today for follow-up. She reports that she continues to use trazodone with good benefit. She states that some nights she does have a hard time going to sleep but overall it works well for her. She states at home she is able to complete all ADLs independently. She operates a Teacher, music without difficulty. She states that she has only gotten lost on one  occasion. She states her husband completes all the finances. She still prepare some meals. Denies any difficulty with this task. Overall she feels that her memory may have gotten slightly better. She does have impaired hearing. She returns today for an evaluation.  HISTORY Per Dr. Edwena Felty notes: 11-18-15  The patient reports that her sleep has been doing well she feels that her insomnia has been well controlled.  She feels that she is getting more forgetfull.  She has no recent bouts of angina and she states she doesn't have HTN. She requested me to go through her medication list with her, she is taking losartan for high blood pressure but feels that she does not have high blood pressure.  Nitroglycerin for angina, and omeprazole for gastric reflux, Ranexa, tramadol for back pain as needed, trazodone 50 mg for sleep. She reports her PCP, Dr. Linna Darner , has retired and she is concerned about finding a new one.   She wrote a short note under her memory tests stating that she does not like this test!. She could not copy an image the interlocking pentagons were not meeting her clinical fluency test was only 5 points the clock face however was 4 out of 4 points. She sees Dr. Redmond Pulling for diabetes which is well controlled. She also takes thyroid medication.   HISTORY [facility]): Janet Steele is an 83 year old female with a history of chronic insomnia and chronic motor tic. She returns today for follow-up. She continues to  take trazodone and tolerates it well. She reports that her motor tics have remained stable however they still bother her. She reports that she uses Valium for anxiety however she rarely takes this medication according to the patient. Patient reports that this week she woke up with back pain-- this has been chronic. She plans to follow-up with Dr. Ellene Route for epidural steroid injections. She states that she did take medication for her back pain however she does not remember what the  medication was. She is on Ultram for pain however she states that was not the medication she took. The patient refuses to complete the memory tests stating that the medication made her feel a little off. She states that she had to have her husband drive her for that reason. She returns today for an evaluation.  REVIEW OF SYSTEMS: Out of a complete 14 system review of symptoms, the patient complains only of the following symptoms, and all other reviewed systems are negative.  ALLERGIES: Allergies  Allergen Reactions  . Morphine Hives, Itching and Rash  . Penicillins Itching and Rash      . Prednisone Other (See Comments)    MEMORY LOSS   . Sulfonamide Derivatives Itching and Rash  . Codeine Rash  . Hydrocodone-Acetaminophen Other (See Comments)    unknown  . Meperidine Hcl Itching  . Metoclopramide Hcl Itching and Rash  . Metoprolol Succinate Other (See Comments)    nervous  . Moxifloxacin Itching  . Oxycodone-Aspirin Rash  . Pentazocine Lactate Nausea Only  . Pioglitazone Other (See Comments)    bloating    HOME MEDICATIONS: Outpatient Medications Prior to Visit  Medication Sig Dispense Refill  . amLODipine (NORVASC) 5 MG tablet Take 1 tablet (5 mg total) by mouth daily. 30 tablet 6  . aspirin 81 MG EC tablet Take 1 tablet (81 mg total) by mouth daily. 30 tablet 11  . clopidogrel (PLAVIX) 75 MG tablet Take 1 tablet (75 mg total) by mouth daily. 30 tablet 6  . DULoxetine (CYMBALTA) 60 MG capsule take 1 capsule by mouth once daily 90 capsule 1  . gabapentin (NEURONTIN) 100 MG capsule Take 100 mg by mouth 3 (three) times daily.    Marland Kitchen glimepiride (AMARYL) 2 MG tablet Take 2 mg by mouth daily before breakfast.     . levothyroxine (SYNTHROID, LEVOTHROID) 150 MCG tablet Take 1 tablet (150 mcg total) by mouth daily. 90 tablet 1  . losartan (COZAAR) 50 MG tablet Take 1 tablet (50 mg total) by mouth daily. 30 tablet 5  . metFORMIN (GLUCOPHAGE) 1000 MG tablet Take 1 tablet (1,000 mg total)  by mouth 2 (two) times daily with a meal. 180 tablet 1  . metoprolol tartrate (LOPRESSOR) 25 MG tablet Take 1 tablet (25 mg total) by mouth 2 (two) times daily. 60 tablet 6  . mirabegron ER (MYRBETRIQ) 25 MG TB24 tablet Take 1 tablet (25 mg total) by mouth daily. For overactive bladder 30 tablet 5  . mupirocin ointment (BACTROBAN) 2 %     . nitroGLYCERIN (NITROSTAT) 0.4 MG SL tablet Place 1 tablet (0.4 mg total) under the tongue every 5 (five) minutes as needed. For chest pain 25 tablet 5  . pantoprazole (PROTONIX) 40 MG tablet Take 1 tablet (40 mg total) by mouth 2 (two) times daily. 60 tablet 6  . rosuvastatin (CRESTOR) 40 MG tablet Take 1 tablet (40 mg total) by mouth daily. 90 tablet 1  . traMADol (ULTRAM) 50 MG tablet Take 50 mg by mouth every  6 (six) hours as needed for moderate pain (back pain).   0  . traZODone (DESYREL) 50 MG tablet Take 1 tablet (50 mg total) by mouth at bedtime as needed for sleep. 90 tablet 3   No facility-administered medications prior to visit.     PAST MEDICAL HISTORY: Past Medical History:  Diagnosis Date  . Anemia   . Anxiety   . Arthritis    "fingers" (05/19/2016  . CAD (coronary artery disease)    a. s/p CABG 1982 b. redo CABG 2003. c. [place] despite med rx 05/2016: successful but complicated PCI of PDA beyond graft site, c/b localized dissection requiring overlapping stent.  . Carotid bruit    a. Duplex 01/2015: patent vessels, 1-39% BICA, f/u PRN recommened.  . Colon polyp    adenomatous  . Colon, diverticulosis   . Degenerative joint disease   . Diastolic dysfunction   . Esophageal stricture   . GERD (gastroesophageal reflux disease)   . Hiatal hernia   . History of blood transfusion    "related to my bypass"  . Hyperlipidemia   . Hypertension   . Hypothyroidism   . Ischemic cardiomyopathy    a. Cath 05/2016: EF 40% with severe inferior wall HK, suspect hibernating myocardium.  . Myocardial infarction 1977; 1982  . Osteoarthrosis, unspecified  whether generalized or localized, unspecified site   . Presence of permanent cardiac pacemaker   . PVD (peripheral vascular disease) ([place])   . Type II diabetes mellitus ([place])   . Unspecified hearing loss     PAST SURGICAL HISTORY: Past Surgical History:  Procedure Laterality Date  . ABDOMINAL HYSTERECTOMY    . APPENDECTOMY    . BACK SURGERY    . CARDIAC CATHETERIZATION N/A 05/19/2016   Procedure: Left Heart Cath and Cors/Grafts Angiography;  Surgeon: Belva Crome, MD;  Location: [place] CV LAB;  Service: Cardiovascular;  Laterality: N/A;  . CARDIAC CATHETERIZATION N/A 05/19/2016   Procedure: Coronary Stent Intervention;  Surgeon: Belva Crome, MD;  Location: [place] CV LAB;  Service: Cardiovascular;  Laterality: N/A;  . CATARACT EXTRACTION W/ INTRAOCULAR LENS  IMPLANT, BILATERAL Bilateral   . CHOLECYSTECTOMY OPEN    . COLONOSCOPY    . CORONARY ANGIOPLASTY WITH STENT PLACEMENT  05/19/2016   "2 stents?"  . CORONARY ARTERY BYPASS GRAFT  1982; 2003   x4(1982),02-2002 CABG X2  . ESOPHAGOGASTRODUODENOSCOPY (EGD) WITH ESOPHAGEAL DILATION  "2-3 times"  . INSERT / REPLACE / REMOVE PACEMAKER    . LEFT HEART CATHETERIZATION WITH CORONARY/GRAFT ANGIOGRAM N/A 03/05/2014   Procedure: LEFT HEART CATHETERIZATION WITH Beatrix Fetters;  Surgeon: Sinclair Grooms, MD;  Location[facility] C[facility] LAB;  Service: Cardiovascular;  Laterality: N/A;  . LUMBAR [place] SURGERY  01-2000  . OOPHORECTOMY Bilateral   . PERMANENT PACEMAKER INSERTION N/A 07/06/2012   MDT Adapta L implanted by Dr Rayann Heman for symptomatic bradycardia  . TONSILLECTOMY  1930s    FAMILY HISTORY: Family History  Problem Relation Age of Onset  . Heart disease Sister   . Heart attack Sister   . Colon cancer Neg Hx   . Breast cancer Neg Hx   . Celiac disease Neg Hx   . Cirrhosis Neg Hx   . Clotting disorder Neg Hx   . Colitis Neg Hx   . Colon polyps Neg Hx   . Crohn's disease Neg Hx   . Cystic fibrosis Neg Hx   . Diabetes Neg  Hx   . Esophageal cancer Neg Hx   .  Hemochromatosis Neg Hx   . Inflammatory bowel disease Neg Hx   . Irritable bowel syndrome Neg Hx   . Kidney disease Neg Hx   . Liver cancer Neg Hx   . Liver disease Neg Hx   . Ovarian cancer Neg Hx   . Pancreatic cancer Neg Hx   . Prostate cancer Neg Hx   . Rectal cancer Neg Hx   . Stomach cancer Neg Hx   . Ulcerative colitis Neg Hx   . Uterine cancer Neg Hx   . Wilson's disease Neg Hx     SOCIAL HISTORY: Social History   Social History  . Marital status: Married    Spouse name: Janet Steele  . Number of children: 3  . Years of education: 16   Occupational History  . Retired    .  Retired   Social History Main Topics  . Smoking status: Former Smoker    Packs/day: 0.50    Years: 30.00    Types: Cigarettes    Quit date: 07/05/1976  . Smokeless tobacco: Never Used  . Alcohol use 1.8 oz/week    3 Glasses of wine per week  . Drug use: No  . Sexual activity: No   Other Topics Concern  . Not on file   Social History Narrative   Patient is married Janet Steele) and lives at home with her husband.   Patient has three adult children.   Patient is retired.   Patient has a college education.   Patient is right-handed.   Patient does not drink any caffeine.      PHYSICAL EXAM  Vitals:   01/04/17 1317  BP: 100/64  Pulse: 88  Weight: 129 lb 9.6 oz (58.8 kg)  Height: 5' (1.524 m)   Body mass index is 25.31 kg/m.  MMSE - Mini Mental State Exam 01/04/2017 11/17/2016 11/18/2015  Not completed: - - -  Orientation to time 3 4 4   Orientation to Place 5 5 5   Registration 3 3 3   Attention/ Calculation 5 5 5   Recall 3 2 0  Language- name 2 objects 2 2 2   Language- repeat 1 0 0  Language- follow 3 step command 3 3 3   Language- read & follow direction 1 1 0  Language-read & follow direction-comments - - PT read the sentence twice but did not perform the task.  Write a sentence 1 1 1   Copy design 1 1 0  Total score 28 27 23       Generalized: Well developed, in no acute distress   Neurological examination  Mentation: Alert oriented to time, place, history taking. Follows all commands speech and language fluent Cranial nerve II-XII: Pupils were equal round reactive to light. Extraocular movements were full, visual field were full on confrontational test. Facial sensation and strength were normal. Uvula tongue midline. Head turning and shoulder shrug  were normal and symmetric. Motor: The motor testing reveals 5 over 5 strength of all 4 extremities. Good symmetric motor tone is noted throughout.  Sensory: Sensory testing is intact to soft touch on all 4 extremities. No evidence of extinction is noted.  Coordination: Cerebellar testing reveals good finger-nose-finger and heel-to-shin bilaterally.  Gait and station: Gait is normal.  Reflexes: Deep tendon reflexes are symmetric and normal bilaterally.   DIAGNOSTIC DATA (LABS, IMAGING, TESTING) - I reviewed patient records, labs, notes, testing and imaging myself where available.  Lab Results  Component Value Date   WBC 6.9 05/25/2016   HGB 13.6 05/25/2016  HCT 39.7 05/25/2016   MCV 83.2 05/25/2016   PLT 359 05/25/2016      Component Value Date/Time   NA 136 12/17/2016 1507   NA 135 12/17/2016 1507   K 4.6 12/17/2016 1507   K 4.6 12/17/2016 1507   CL 99 12/17/2016 1507   CL 99 12/17/2016 1507   CO2 29 12/17/2016 1507   CO2 29 12/17/2016 1507   GLUCOSE 210 (H) 12/17/2016 1507   GLUCOSE 208 (H) 12/17/2016 1507   BUN 17 12/17/2016 1507   BUN 17 12/17/2016 1507   CREATININE 1.05 12/17/2016 1507   CREATININE 1.06 12/17/2016 1507   CREATININE 0.91 (H) 05/25/2016 1027   CALCIUM 9.7 12/17/2016 1507   CALCIUM 9.5 12/17/2016 1507   PROT 6.9 12/17/2016 1507   PROT 6.9 12/17/2016 1507   ALBUMIN 3.9 12/17/2016 1507   ALBUMIN 4.0 12/17/2016 1507   AST 16 12/17/2016 1507   AST 16 12/17/2016 1507   ALT 14 12/17/2016 1507   ALT 14 12/17/2016 1507   ALKPHOS  106 12/17/2016 1507   ALKPHOS 109 12/17/2016 1507   BILITOT 0.5 12/17/2016 1507   BILITOT 0.6 12/17/2016 1507   GFRNONAA 59 (L) 05/20/2016 0313   GFRAA >60 05/20/2016 0313   Lab Results  Component Value Date   CHOL 252 (H) 12/17/2016   HDL 33.70 (L) 12/17/2016   LDLCALC 178 (H) 12/17/2016   LDLDIRECT 173.0 08/03/2016   TRIG 200.0 (H) 12/17/2016   CHOLHDL 7 12/17/2016   Lab Results  Component Value Date   HGBA1C 8.7 (H) 12/17/2016   Lab Results  Component Value Date   VITAMINB12 519 12/17/2016   Lab Results  Component Value Date   TSH 27.95 (H) 12/17/2016      ASSESSMENT AND PLAN 83 y.o. year old female  has a past medical history of Anemia; Anxiety; Arthritis; CAD (coronary artery disease); Carotid bruit; Colon polyp; Colon, diverticulosis; Degenerative joint disease; Diastolic dysfunction; Esophageal stricture; GERD (gastroesophageal reflux disease); Hiatal hernia; History of blood transfusion; Hyperlipidemia; Hypertension; Hypothyroidism; Ischemic cardiomyopathy; Myocardial infarction (3845; 1982); Osteoarthrosis, unspecified whether generalized or localized, unspecified site; Presence of permanent cardiac pacemaker; PVD (peripheral vascular disease) ([place]); Type II diabetes mellitus ([place]); and Unspecified hearing loss. here with:  1. Memory disturbance  Memory score is stable. Not sure if Keflex was the cause of her confusion. She had blood work with her primary care that was relatively unremarkable. I will check a urinalysis today just to rule out a urinary tract infection. Advised the patient that she will return in 3 months. Encouraged her to bring her husband and 2 sons with her. She voiced understanding. She will will call if things worsen.  I spent 25 minutes with the patient 50% of this time was spent discussing patient's memory disturbance with her and her husband over the phone.  Ward Givens, MSN, NP-C 01/04/2017, 1:05 PM [facility] [address], [place], [place] [address] [phone]

## 2017-01-04 NOTE — Progress Notes (Signed)
I agree with the assessment and plan as directed by NP .The patient is known to me .   Demerius Podolak, MD  

## 2017-01-12 DIAGNOSIS — E119 Type 2 diabetes mellitus without complications: Secondary | ICD-10-CM | POA: Diagnosis not present

## 2017-01-12 DIAGNOSIS — Z961 Presence of intraocular lens: Secondary | ICD-10-CM | POA: Diagnosis not present

## 2017-01-12 DIAGNOSIS — H26493 Other secondary cataract, bilateral: Secondary | ICD-10-CM | POA: Diagnosis not present

## 2017-01-12 DIAGNOSIS — H524 Presbyopia: Secondary | ICD-10-CM | POA: Diagnosis not present

## 2017-01-16 NOTE — Progress Notes (Signed)
Subjective:    Patient ID: Janet Steele, female    DOB: 1929/01/07, 83 y.o.   MRN: [id number]  HPI The patient is here for follow up.  She told us her husband and her drove to [facility] yesterday and drove back today.  Her husband states this did not happen.    It appearred from her blood work at her last visit that she was not taking her medication.  She states she is.  She does not use a weekly pill container - she uses the bottles and turns them over after she takes each pill.  Hypertension: She is taking her medication daily. She is compliant with a low sodium diet.  She denies chest pain, palpitations, edema, shortness of breath and regular headaches. She is not exercising regularly.    Hypothyroidism:  She is taking her medication daily.  She denies any recent changes in energy or weight that are unexplained.   Hyperlipidemia: She is taking her medication daily. She is compliant with a low fat/cholesterol diet. She is not exercising regularly. She denies myalgias.   Diabetes: She is taking her medication daily as prescribed. She is compliant with a diabetic diet. She is not exercising regularly. She monitors her sugars and they have been running 140's.  She is up-to-date with an ophthalmology examination.   Her right foot appears red and she states it feels warm.  She denies any pain or skin ulcers/breakdown.   Medications and allergies reviewed with patient and updated if appropriate.  Patient Active Problem List   Diagnosis Date Noted  . Overactive bladder 12/17/2016  . Anemia 05/20/2016  . Coronary artery disease involving coronary bypass graft of native heart without angina pectoris 05/19/2016  . Numbness and tingling of foot 12/31/2015  . MCI (mild cognitive impairment) with memory loss 11/18/2015  . Facial tic 05/07/2015  . Facial nerve spasm 05/07/2015  . Sick sinus syndrome ([place]) 02/26/2015  . Cervical dystonia 12/04/2014  . Chronic motor tic 09/12/2014    . Nonspecific abnormal electrocardiogram (ECG) (EKG)   . Unstable angina ([place]) 03/02/2014  . Pacemaker-Medtronic 07/07/2012  . Bradycardia 07/05/2012  . GERD (gastroesophageal reflux disease) 07/05/2012  . Near syncope 12/23/2011  . Abnormal involuntary movements(781.0) 12/17/2009  . Disturbance in sleep behavior 12/11/2009  . CAD, ARTERY BYPASS GRAFT 09/24/2009  . CAROTID BRUIT 09/24/2009  . Diabetes ([place]) 01/30/2009  . DYSPHAGIA 06/19/2008  . Anxiety 02/14/2008  . EROSIVE ESOPHAGITIS 02/14/2008  . CONSTIPATION, CHRONIC 02/14/2008  . DEGENERATIVE JOINT DISEASE 02/14/2008  . HEARING LOSS 01/04/2008  . PVD 01/04/2008  . Hypothyroidism 09/25/2007  . HYPERLIPIDEMIA 09/25/2007  . Essential hypertension 09/07/2007  . HYPERTRIGLYCERIDEMIA 03/16/2007  . Diabetic polyneuropathy ([place]) 03/16/2007  . Stricture and stenosis of esophagus 03/16/2007  . HIATAL HERNIA 02/16/2007  . HEMORRHOIDS 12/31/1997  . DIVERTICULOSIS, COLON 12/31/1997    Current Outpatient Prescriptions on File Prior to Visit  Medication Sig Dispense Refill  . amLODipine (NORVASC) 5 MG tablet Take 1 tablet (5 mg total) by mouth daily. 30 tablet 6  . aspirin 81 MG EC tablet Take 1 tablet (81 mg total) by mouth daily. 30 tablet 11  . clopidogrel (PLAVIX) 75 MG tablet Take 1 tablet (75 mg total) by mouth daily. 30 tablet 6  . DULoxetine (CYMBALTA) 60 MG capsule take 1 capsule by mouth once daily 90 capsule 1  . gabapentin (NEURONTIN) 100 MG capsule Take 100 mg by mouth 3 (three) times daily.    Marland Kitchen glimepiride (AMARYL)  2 MG tablet Take 2 mg by mouth daily before breakfast.     . levothyroxine (SYNTHROID, LEVOTHROID) 150 MCG tablet Take 1 tablet (150 mcg total) by mouth daily. 90 tablet 1  . losartan (COZAAR) 50 MG tablet Take 1 tablet (50 mg total) by mouth daily. 30 tablet 5  . metFORMIN (GLUCOPHAGE) 1000 MG tablet Take 1 tablet (1,000 mg total) by mouth 2 (two) times daily with a meal. 180 tablet 1  . metoprolol tartrate  (LOPRESSOR) 25 MG tablet Take 1 tablet (25 mg total) by mouth 2 (two) times daily. 60 tablet 6  . mupirocin ointment (BACTROBAN) 2 %     . nitroGLYCERIN (NITROSTAT) 0.4 MG SL tablet Place 1 tablet (0.4 mg total) under the tongue every 5 (five) minutes as needed. For chest pain 25 tablet 5  . pantoprazole (PROTONIX) 40 MG tablet Take 1 tablet (40 mg total) by mouth 2 (two) times daily. 60 tablet 6  . rosuvastatin (CRESTOR) 40 MG tablet Take 1 tablet (40 mg total) by mouth daily. 90 tablet 1  . traMADol (ULTRAM) 50 MG tablet Take 50 mg by mouth every 6 (six) hours as needed for moderate pain (back pain).   0  . traZODone (DESYREL) 50 MG tablet Take 1.5 tablets (75 mg total) by mouth at bedtime as needed for sleep. 45 tablet 11   No current facility-administered medications on file prior to visit.     Past Medical History:  Diagnosis Date  . Anemia   . Anxiety   . Arthritis    "fingers" (05/19/2016  . CAD (coronary artery disease)    a. s/p CABG 1982 b. redo CABG 2003. c. [place] despite med rx 05/2016: successful but complicated PCI of PDA beyond graft site, c/b localized dissection requiring overlapping stent.  . Carotid bruit    a. Duplex 01/2015: patent vessels, 1-39% BICA, f/u PRN recommened.  . Colon polyp    adenomatous  . Colon, diverticulosis   . Degenerative joint disease   . Diastolic dysfunction   . Esophageal stricture   . GERD (gastroesophageal reflux disease)   . Hiatal hernia   . History of blood transfusion    "related to my bypass"  . Hyperlipidemia   . Hypertension   . Hypothyroidism   . Ischemic cardiomyopathy    a. Cath 05/2016: EF 40% with severe inferior wall HK, suspect hibernating myocardium.  . Myocardial infarction 1977; 1982  . Osteoarthrosis, unspecified whether generalized or localized, unspecified site   . Presence of permanent cardiac pacemaker   . PVD (peripheral vascular disease) ([place])   . Type II diabetes mellitus ([place])   . Unspecified hearing loss      Past Surgical History:  Procedure Laterality Date  . ABDOMINAL HYSTERECTOMY    . APPENDECTOMY    . BACK SURGERY    . CARDIAC CATHETERIZATION N/A 05/19/2016   Procedure: Left Heart Cath and Cors/Grafts Angiography;  Surgeon: Belva Crome, MD;  Location: [place] [facility];  Service: Cardiovascular;  Laterality: N/A;  . CARDIAC CATHETERIZATION N/A 05/19/2016   Procedure: Coronary Stent Intervention;  Surgeon: Belva Crome, MD;  Location: [place] [facility];  Service: Cardiovascular;  Laterality: N/A;  . CATARACT EXTRACTION W/ INTRAOCULAR LENS  IMPLANT, BILATERAL Bilateral   . CHOLECYSTECTOMY OPEN    . COLONOSCOPY    . CORONARY ANGIOPLASTY WITH STENT PLACEMENT  05/19/2016   "2 stents?"  . CORONARY ARTERY BYPASS GRAFT  1982; 2003   x4(1982),02-2002 CABG X2  .  ESOPHAGOGASTRODUODENOSCOPY (EGD) WITH ESOPHAGEAL DILATION  "2-3 times"  . INSERT / REPLACE / REMOVE PACEMAKER    . LEFT HEART CATHETERIZATION WITH CORONARY/GRAFT ANGIOGRAM N/A 03/05/2014   Procedure: LEFT HEART CATHETERIZATION WITH Beatrix Fetters;  Surgeon: Sinclair Grooms, MD;  Location[facility] C[facility];  Service: Cardiovascular;  Laterality: N/A;  . LUMBAR [place] SURGERY  01-2000  . OOPHORECTOMY Bilateral   . PERMANENT PACEMAKER INSERTION N/A 07/06/2012   MDT Adapta L implanted by Dr Rayann Heman for symptomatic bradycardia  . TONSILLECTOMY  1930s    Social History   Social History  . Marital status: Married    Spouse name: [name]  . Number of children: 3  . Years of education: 16   Occupational History  . Retired    .  Retired   Social History Main Topics  . Smoking status: Former Smoker    Packs/day: 0.50    Years: 30.00    Types: Cigarettes    Quit date: 07/05/1976  . Smokeless tobacco: Never Used  . Alcohol use 1.8 oz/week    3 Glasses of wine per week  . Drug use: No  . Sexual activity: No   Other Topics Concern  . None   Social History Narrative   Patient is married [name]) and lives at home with her  husband.   Patient has three adult children.   Patient is retired.   Patient has a college education.   Patient is right-handed.   Patient does not drink any caffeine.    Family History  Problem Relation Age of Onset  . Heart disease Sister   . Heart attack Sister   . Colon cancer Neg Hx   . Breast cancer Neg Hx   . Celiac disease Neg Hx   . Cirrhosis Neg Hx   . Clotting disorder Neg Hx   . Colitis Neg Hx   . Colon polyps Neg Hx   . Crohn's disease Neg Hx   . Cystic fibrosis Neg Hx   . Diabetes Neg Hx   . Esophageal cancer Neg Hx   . Hemochromatosis Neg Hx   . Inflammatory bowel disease Neg Hx   . Irritable bowel syndrome Neg Hx   . Kidney disease Neg Hx   . Liver cancer Neg Hx   . Liver disease Neg Hx   . Ovarian cancer Neg Hx   . Pancreatic cancer Neg Hx   . Prostate cancer Neg Hx   . Rectal cancer Neg Hx   . Stomach cancer Neg Hx   . Ulcerative colitis Neg Hx   . Uterine cancer Neg Hx   . Wilson's disease Neg Hx     Review of Systems  Constitutional: Negative for fever.  Respiratory: Positive for cough (chronic). Negative for shortness of breath and wheezing.   Cardiovascular: Negative for chest pain, palpitations and leg swelling.  Neurological: Negative for light-headedness and headaches.       Objective:   Vitals:   01/17/17 1331  BP: 124/70  Pulse: 86  Resp: 16  Temp: 97.8 F (36.6 C)   Wt Readings from Last 3 Encounters:  01/17/17 128 lb (58.1 kg)  01/04/17 129 lb 9.6 oz (58.8 kg)  12/17/16 130 lb (59 kg)   Body mass index is 25 kg/m.   Physical Exam    Constitutional: Appears well-developed and well-nourished. No distress.  HENT:  Head: Normocephalic and atraumatic.  Neck: Neck supple. No tracheal deviation present. No thyromegaly present.  No cervical lymphadenopathy Cardiovascular: Normal  rate, regular rhythm and normal heart sounds.   No murmur heard. No carotid bruit .  No edema. Decreased pulses DP b/l Pulmonary/Chest: Effort  normal and breath sounds normal. No respiratory distress. No has no wheezes. No rales.  Skin: Skin is warm and dry. Not diaphoretic. right foot red in appearance and slightly warm - non tender, no skin breakdown or ulcers Psychiatric: Normal mood and affect. Behavior is normal.      Assessment & Plan:    See Problem List for Assessment and Plan of chronic medical problems.   FU in 3 months

## 2017-01-17 ENCOUNTER — Encounter: Payer: Self-pay | Admitting: Internal Medicine

## 2017-01-17 ENCOUNTER — Other Ambulatory Visit (INDEPENDENT_AMBULATORY_CARE_PROVIDER_SITE_OTHER): Payer: Medicare Other

## 2017-01-17 ENCOUNTER — Ambulatory Visit (INDEPENDENT_AMBULATORY_CARE_PROVIDER_SITE_OTHER): Payer: Medicare Other | Admitting: Internal Medicine

## 2017-01-17 VITALS — BP 124/70 | HR 86 | Temp 97.8°F | Resp 16 | Wt 128.0 lb

## 2017-01-17 DIAGNOSIS — E1165 Type 2 diabetes mellitus with hyperglycemia: Secondary | ICD-10-CM

## 2017-01-17 DIAGNOSIS — R413 Other amnesia: Secondary | ICD-10-CM

## 2017-01-17 DIAGNOSIS — E039 Hypothyroidism, unspecified: Secondary | ICD-10-CM | POA: Diagnosis not present

## 2017-01-17 DIAGNOSIS — Z0000 Encounter for general adult medical examination without abnormal findings: Secondary | ICD-10-CM

## 2017-01-17 DIAGNOSIS — E782 Mixed hyperlipidemia: Secondary | ICD-10-CM | POA: Diagnosis not present

## 2017-01-17 DIAGNOSIS — I739 Peripheral vascular disease, unspecified: Secondary | ICD-10-CM

## 2017-01-17 DIAGNOSIS — I10 Essential (primary) hypertension: Secondary | ICD-10-CM

## 2017-01-17 LAB — LIPID PANEL
CHOL/HDL RATIO: 5
CHOLESTEROL: 140 mg/dL (ref 0–200)
HDL: 27.4 mg/dL — ABNORMAL LOW (ref >39.00)
NonHDL: 112.78
TRIGLYCERIDES: 214 mg/dL — AB (ref 0.0–149.0)
VLDL: 42.8 mg/dL — ABNORMAL HIGH (ref 0.0–40.0)

## 2017-01-17 LAB — COMPREHENSIVE METABOLIC PANEL
ALBUMIN: 4.1 g/dL (ref 3.5–5.2)
ALK PHOS: 143 U/L — AB (ref 39–117)
ALT: 12 U/L (ref 0–35)
AST: 17 U/L (ref 0–37)
BILIRUBIN TOTAL: 0.7 mg/dL (ref 0.2–1.2)
BUN: 15 mg/dL (ref 6–23)
CALCIUM: 9.3 mg/dL (ref 8.4–10.5)
CO2: 27 meq/L (ref 19–32)
CREATININE: 1.08 mg/dL (ref 0.40–1.20)
Chloride: 99 mEq/L (ref 96–112)
GFR: 51.22 mL/min — AB (ref >60.00)
Glucose, Bld: 383 mg/dL — ABNORMAL HIGH (ref 70–99)
Potassium: 3.9 mEq/L (ref 3.5–5.1)
Sodium: 134 mEq/L — ABNORMAL LOW (ref 135–145)
TOTAL PROTEIN: 6.9 g/dL (ref 6.0–8.3)

## 2017-01-17 LAB — HEMOGLOBIN A1C: HEMOGLOBIN A1C: 9.2 % — AB (ref 4.6–6.5)

## 2017-01-17 LAB — TSH: TSH: 0.81 u[IU]/mL (ref 0.35–4.50)

## 2017-01-17 LAB — LDL CHOLESTEROL, DIRECT: LDL DIRECT: 93 mg/dL

## 2017-01-17 LAB — VITAMIN B12: Vitamin B-12: 601 pg/mL (ref 211–911)

## 2017-01-17 NOTE — Patient Instructions (Signed)
  [name] , Thank you for taking time to come for your Medicare Wellness Visit. I appreciate your ongoing commitment to your health goals. Please review the following plan we discussed and let me know if I can assist you in the future.   These are the goals we discussed: Goals    None      This is a list of the screening recommended for you and due dates:  Health Maintenance  Topic Date Due  . Shingles Vaccine  10/13/1991  . DEXA scan (bone density measurement)  10/12/1996  . Tetanus Vaccine  05/06/2014  . Eye exam for diabetics  01/02/2015  . Hemoglobin A1C  06/16/2017  . Complete foot exam   08/03/2017  . Flu Shot  Completed  . Pneumonia vaccines  Completed     Test(s) ordered today. Your results will be released to [place] (or called to you) after review, usually within 72hours after test completion. If any changes need to be made, you will be notified at that same time.  All other Health Maintenance issues reviewed.   All recommended immunizations and age-appropriate screenings are up-to-date or discussed.   Medications reviewed and updated.  No changes recommended at this time.   A referral was ordered for vascular surgery  Please followup in 3 months

## 2017-01-17 NOTE — Progress Notes (Signed)
Subjective:   Janet Steele is a 83 y.o. female who presents for an Initial Medicare Annual Wellness Visit.  Review of Systems    No ROS.  Medicare Wellness Visit.   Cardiac Risk Factors include: advanced age (>83men, >24 women);diabetes mellitus;dyslipidemia;hypertension;family history of premature cardiovascular disease  Sleep patterns: has difficulty falling asleep, is not rested upon awakening. States that she does not sleep at all. Home Safety/Smoke Alarms:  Feels safe in home. Smoke alarms in place.   Living environment; residence and Firearm Safety: 2-story house, no firearms. Lives with husband Seat Belt Safety/Bike Helmet: Wears seat belt.   Counseling:   Eye Exam- Last, patient states it has 4-5 years.Dr[facility]- Dentures uppers and lower  Female:    Pap-   N/A aged out   Mammo-  Last 04/11/12, BI-RADS 1 negative     Dexa scan- none       CCS- Last 12/10/08, aged out       Objective:    Today's Vitals   01/17/17 1331  BP: 124/70  Pulse: 86  Resp: 16  Temp: 97.8 F (36.6 C)  TempSrc: Oral  SpO2: 96%  Weight: 128 lb (58.1 kg)   Body mass index is 25 kg/m.   Current Medications (verified) Outpatient Encounter Prescriptions as of 01/17/2017  Medication Sig  . amLODipine (NORVASC) 5 MG tablet Take 1 tablet (5 mg total) by mouth daily.  Marland Kitchen aspirin 81 MG EC tablet Take 1 tablet (81 mg total) by mouth daily.  . clopidogrel (PLAVIX) 75 MG tablet Take 1 tablet (75 mg total) by mouth daily.  . DULoxetine (CYMBALTA) 60 MG capsule take 1 capsule by mouth once daily  . gabapentin (NEURONTIN) 100 MG capsule Take 100 mg by mouth 3 (three) times daily.  Marland Kitchen glimepiride (AMARYL) 2 MG tablet Take 2 mg by mouth daily before breakfast.   . levothyroxine (SYNTHROID, LEVOTHROID) 150 MCG tablet Take 1 tablet (150 mcg total) by mouth daily.  Marland Kitchen losartan (COZAAR) 50 MG tablet Take 1 tablet (50 mg total) by mouth daily.  . metFORMIN (GLUCOPHAGE) 1000 MG tablet Take 1 tablet  (1,000 mg total) by mouth 2 (two) times daily with a meal.  . metoprolol tartrate (LOPRESSOR) 25 MG tablet Take 1 tablet (25 mg total) by mouth 2 (two) times daily.  . mupirocin ointment (BACTROBAN) 2 %   . nitroGLYCERIN (NITROSTAT) 0.4 MG SL tablet Place 1 tablet (0.4 mg total) under the tongue every 5 (five) minutes as needed. For chest pain  . pantoprazole (PROTONIX) 40 MG tablet Take 1 tablet (40 mg total) by mouth 2 (two) times daily.  . rosuvastatin (CRESTOR) 40 MG tablet Take 1 tablet (40 mg total) by mouth daily.  . traMADol (ULTRAM) 50 MG tablet Take 50 mg by mouth every 6 (six) hours as needed for moderate pain (back pain).   . traZODone (DESYREL) 50 MG tablet Take 1.5 tablets (75 mg total) by mouth at bedtime as needed for sleep.   No facility-administered encounter medications on file as of 01/17/2017.     Allergies (verified) Morphine; Penicillins; Prednisone; Sulfonamide derivatives; Codeine; Hydrocodone-acetaminophen; Meperidine hcl; Metoclopramide hcl; Metoprolol succinate; Moxifloxacin; Oxycodone-aspirin; Pentazocine lactate; and Pioglitazone   History: Past Medical History:  Diagnosis Date  . Anemia   . Anxiety   . Arthritis    "fingers" (05/19/2016  . CAD (coronary artery disease)    a. s/p CABG 1982 b. redo CABG 2003. c. [place] despite med rx 05/2016: successful but complicated PCI of  PDA beyond graft site, c/b localized dissection requiring overlapping stent.  . Carotid bruit    a. Duplex 01/2015: patent vessels, 1-39% BICA, f/u PRN recommened.  . Colon polyp    adenomatous  . Colon, diverticulosis   . Degenerative joint disease   . Diastolic dysfunction   . Esophageal stricture   . GERD (gastroesophageal reflux disease)   . Hiatal hernia   . History of blood transfusion    "related to my bypass"  . Hyperlipidemia   . Hypertension   . Hypothyroidism   . Ischemic cardiomyopathy    a. Cath 05/2016: EF 40% with severe inferior wall HK, suspect hibernating myocardium.   . Myocardial infarction 1977; 1982  . Osteoarthrosis, unspecified whether generalized or localized, unspecified site   . Presence of permanent cardiac pacemaker   . PVD (peripheral vascular disease) ([place])   . Type II diabetes mellitus ([place])   . Unspecified hearing loss    Past Surgical History:  Procedure Laterality Date  . ABDOMINAL HYSTERECTOMY    . APPENDECTOMY    . BACK SURGERY    . CARDIAC CATHETERIZATION N/A 05/19/2016   Procedure: Left Heart Cath and Cors/Grafts Angiography;  Surgeon: Belva Crome, MD;  Location: [place] [facility];  Service: Cardiovascular;  Laterality: N/A;  . CARDIAC CATHETERIZATION N/A 05/19/2016   Procedure: Coronary Stent Intervention;  Surgeon: Belva Crome, MD;  Location: [place] [facility];  Service: Cardiovascular;  Laterality: N/A;  . CATARACT EXTRACTION W/ INTRAOCULAR LENS  IMPLANT, BILATERAL Bilateral   . CHOLECYSTECTOMY OPEN    . COLONOSCOPY    . CORONARY ANGIOPLASTY WITH STENT PLACEMENT  05/19/2016   "2 stents?"  . CORONARY ARTERY BYPASS GRAFT  1982; 2003   x4(1982),02-2002 CABG X2  . ESOPHAGOGASTRODUODENOSCOPY (EGD) WITH ESOPHAGEAL DILATION  "2-3 times"  . INSERT / REPLACE / REMOVE PACEMAKER    . LEFT HEART CATHETERIZATION WITH CORONARY/GRAFT ANGIOGRAM N/A 03/05/2014   Procedure: LEFT HEART CATHETERIZATION WITH Beatrix Fetters;  Surgeon: Sinclair Grooms, MD;  Location: [facility] CATH LAB;  Service: Cardiovascular;  Laterality: N/A;  . LUMBAR [place] SURGERY  01-2000  . OOPHORECTOMY Bilateral   . PERMANENT PACEMAKER INSERTION N/A 07/06/2012   MDT Adapta L implanted by Dr Rayann Heman for symptomatic bradycardia  . TONSILLECTOMY  1930s   Family History  Problem Relation Age of Onset  . Heart disease Sister   . Heart attack Sister   . Colon cancer Neg Hx   . Breast cancer Neg Hx   . Celiac disease Neg Hx   . Cirrhosis Neg Hx   . Clotting disorder Neg Hx   . Colitis Neg Hx   . Colon polyps Neg Hx   . Crohn's disease Neg Hx   . Cystic fibrosis  Neg Hx   . Diabetes Neg Hx   . Esophageal cancer Neg Hx   . Hemochromatosis Neg Hx   . Inflammatory bowel disease Neg Hx   . Irritable bowel syndrome Neg Hx   . Kidney disease Neg Hx   . Liver cancer Neg Hx   . Liver disease Neg Hx   . Ovarian cancer Neg Hx   . Pancreatic cancer Neg Hx   . Prostate cancer Neg Hx   . Rectal cancer Neg Hx   . Stomach cancer Neg Hx   . Ulcerative colitis Neg Hx   . Uterine cancer Neg Hx   . Wilson's disease Neg Hx    Social History   Occupational History  . Retired    .  Retired   Social History Main Topics  . Smoking status: Former Smoker    Packs/day: 0.50    Years: 30.00    Types: Cigarettes    Quit date: 07/05/1976  . Smokeless tobacco: Never Used  . Alcohol use 1.8 oz/week    3 Glasses of wine per week  . Drug use: No  . Sexual activity: No    Tobacco Counseling Counseling given: Not Answered   Activities of Daily Living In your present state of health, do you have any difficulty performing the following activities: 01/17/2017 05/19/2016  Hearing? N -  Vision? N -  Difficulty concentrating or making decisions? N -  Walking or climbing stairs? N -  Dressing or bathing? N -  Doing errands, shopping? N N  Preparing Food and eating ? N -  Using the Toilet? N -  In the past six months, have you accidently leaked urine? N -  Do you have problems with loss of bowel control? N -  Managing your Medications? N -  Managing your Finances? N -  Housekeeping or managing your Housekeeping? N -  Some recent data might be hidden    Immunizations and Health Maintenance Immunization History  Administered Date(s) Administered  . Influenza Whole 12/06/1997, 09/25/2007  . Influenza, High Dose Seasonal PF 09/27/2014  . Influenza-Unspecified 08/06/2013, 08/13/2015, 10/15/2016  . Pneumococcal Conjugate-13 08/06/2015  . Pneumococcal Polysaccharide-23 12/06/1996, 05/28/2010  . Td 05/06/2004   Health Maintenance Due  Topic Date Due  .  ZOSTAVAX  10/13/1991  . DEXA SCAN  10/12/1996  . TETANUS/TDAP  05/06/2014  . OPHTHALMOLOGY EXAM  01/02/2015    Patient Care Team: Binnie Rail, MD as PCP - General (Internal Medicine)  Indicate any recent Medical Services you may have received from other than [facility] providers in the past year (date may be approximate).     Assessment:   This is a routine wellness examination for Rainy. Physical assessment deferred to PCP.   Hearing/Vision screen Hearing Screening Comments: Patient states she cannot hear as well as she needs to hear Vision Screening Comments: Reading glasses  Dietary issues and exercise activities discussed: Current Exercise Habits: The patient does not participate in regular exercise at present, Exercise limited by: cardiac condition(s) (scared she will have a heart attack)  Diet (meal preparation, eat out, water intake, caffeinated beverages, dairy products, fruits and vegetables): well balanced eats a variety of fruits and vegetables. 1-2 sprites per day, 3 glasses of water per day.   Discussed increasing water intake.    Goals    None     Depression Screen PHQ 2/9 Scores 01/17/2017 12/17/2016 11/18/2015 02/04/2014  PHQ - 2 Score 0 0 1 6  PHQ- 9 Score - - - 15    Fall Risk Fall Risk  01/17/2017 12/17/2016 11/18/2015 02/04/2014  Falls in the past year? No No No Yes  Number falls in past yr: - - - 2 or more    Cognitive Function: MMSE - Mini Mental State Exam 01/17/2017 01/04/2017 11/17/2016 11/18/2015 11/06/2015  Not completed: - - - - Unable to complete  Orientation to time 5 3 4 4  -  Orientation to Place 5 5 5 5  -  Registration 2 3 3 3  -  Attention/ Calculation 2 5 5 5  -  Recall 2 3 2  0 -  Language- name 2 objects 2 2 2 2  -  Language- repeat 1 1 0 0 -  Language- follow 3 step command 2 3 3 3  -  Language- read & follow direction 1 1 1  0 -  Language-read & follow direction-comments - - - PT read the sentence twice but did not perform the task. -  Write  a sentence 1 1 1 1  -  Copy design 1 1 1  0 -  Total score 24 28 27 23  -   Montreal Cognitive Assessment  11/06/2015  Visuospatial/ Executive (0/5) 0  Naming (0/3) 3  Attention: Read list of digits (0/2) 1      Screening Tests Health Maintenance  Topic Date Due  . ZOSTAVAX  10/13/1991  . DEXA SCAN  10/12/1996  . TETANUS/TDAP  05/06/2014  . OPHTHALMOLOGY EXAM  01/02/2015  . HEMOGLOBIN A1C  06/16/2017  . FOOT EXAM  08/03/2017  . INFLUENZA VACCINE  Completed  . PNA vac Low Risk Adult  Completed      Plan:       During the course of the visit, Saidy was educated and counseled about the following appropriate screening and preventive services:   Vaccines to include Pneumoccal, Influenza, Hepatitis B, Td, Zostavax, HCV  Cardiovascular disease screening  Colorectal cancer screening  Bone density screening  Diabetes screening  Glaucoma screening  Mammography/PAP  Nutrition counseling  Patient Instructions (the written plan) were given to the patient.    Michiel Cowboy, RN   01/17/2017    Medical screening examination/treatment/procedure(s) were performed by non-physician practitioner and as supervising physician I was immediately available for consultation/collaboration. I agree with above. Binnie Rail, MD

## 2017-01-17 NOTE — Assessment & Plan Note (Signed)
Check tsh  Titrate med dose if needed  

## 2017-01-17 NOTE — Assessment & Plan Note (Signed)
She states she is taking her medication daily - lipid panel very elevated last month - I doubt she was taking the medication Recheck lipids even though she is not fasting.

## 2017-01-17 NOTE — Assessment & Plan Note (Signed)
Check a1c to see if there has been improvement  Stressed taking medication daily - advised using a pill container

## 2017-01-17 NOTE — Progress Notes (Signed)
Pre visit review using our clinic review tool, if applicable. No additional management support is needed unless otherwise documented below in the visit note. 

## 2017-01-17 NOTE — Assessment & Plan Note (Signed)
BP well controlled Current regimen effective and well tolerated Continue current medications at current doses cmp  

## 2017-01-17 NOTE — Assessment & Plan Note (Signed)
Redness in right foot, feels warm, decreased pulses in both feet Likely PAD of right leg > left leg Agrees referral to vascular surgery - referred today

## 2017-01-24 ENCOUNTER — Other Ambulatory Visit: Payer: Self-pay | Admitting: Emergency Medicine

## 2017-01-24 ENCOUNTER — Encounter: Payer: Self-pay | Admitting: Emergency Medicine

## 2017-01-24 MED ORDER — SITAGLIPTIN PHOSPHATE 50 MG PO TABS: 50.0000 mg | ORAL_TABLET | Freq: Every day | ORAL | 0 refills | Status: DC

## 2017-01-27 ENCOUNTER — Other Ambulatory Visit: Payer: Self-pay | Admitting: Internal Medicine

## 2017-01-27 ENCOUNTER — Telehealth: Payer: Self-pay | Admitting: Internal Medicine

## 2017-01-27 DIAGNOSIS — G3184 Mild cognitive impairment, so stated: Secondary | ICD-10-CM

## 2017-01-27 NOTE — Telephone Encounter (Signed)
Pt called in and said that she is getting up 4 and 5 times a night and cant make it to the bathroom.  She wants to know if there is that can be called in for her that can help her?  She said that she has spoke with dr Quay Burow about this before   Best number [phone]

## 2017-01-27 NOTE — Telephone Encounter (Signed)
Please advise 

## 2017-01-27 NOTE — Telephone Encounter (Signed)
Last month I prescribed myrbetriq - not sure if it was covered or not, but is no longer on her list.  Most other medications call affect her memory, so I do not think are a good idea.  Does she want to see a urologist for further evaluation?

## 2017-01-28 NOTE — Telephone Encounter (Signed)
Tried contacting pt, unable to LVM

## 2017-02-01 NOTE — Telephone Encounter (Signed)
Pts husband was seen in office today, explained to him MDs response. He would also like to have a referral placed for a second opinion with neurology. Please advise, pt is currently seeing [facility].

## 2017-02-01 NOTE — Telephone Encounter (Signed)
Discussed with husband at his visit.   Referral ordered for [facility] neurology.

## 2017-02-08 ENCOUNTER — Ambulatory Visit: Payer: Medicare Other | Admitting: Neurology

## 2017-02-16 ENCOUNTER — Other Ambulatory Visit: Payer: Self-pay | Admitting: Internal Medicine

## 2017-02-16 MED ORDER — AZITHROMYCIN 250 MG PO TABS: ORAL_TABLET | ORAL | 0 refills | Status: DC

## 2017-02-16 NOTE — Telephone Encounter (Signed)
Please advise 

## 2017-02-16 NOTE — Telephone Encounter (Signed)
Pt's spouse called stating Janet Steele has been sick, coughing,congestion,sneezing for couple wks now. He is hopping Dr. Quay Burow can send in abx to [place]. Please advise, she is really sick cant come in

## 2017-02-16 NOTE — Telephone Encounter (Signed)
LVM informing pts husband RX has been sent to [facility] and to call if no improvement after completing.

## 2017-02-16 NOTE — Telephone Encounter (Signed)
She really should be seen - it is hard to treat appropriately without evaluating her.   We can try a zpak, which I believe she tolerates.  If she is not feeling better or starts to feel worse she needs to be seen.   rx pending - ok to send in

## 2017-02-25 ENCOUNTER — Encounter: Payer: Self-pay | Admitting: Internal Medicine

## 2017-02-25 ENCOUNTER — Encounter: Payer: Self-pay | Admitting: Vascular Surgery

## 2017-03-01 ENCOUNTER — Other Ambulatory Visit: Payer: Self-pay

## 2017-03-01 DIAGNOSIS — I739 Peripheral vascular disease, unspecified: Secondary | ICD-10-CM

## 2017-03-07 ENCOUNTER — Ambulatory Visit: Payer: Medicare Other | Admitting: Nurse Practitioner

## 2017-03-09 ENCOUNTER — Encounter: Payer: Self-pay | Admitting: Vascular Surgery

## 2017-03-09 ENCOUNTER — Ambulatory Visit (HOSPITAL_COMMUNITY)
Admission: RE | Admit: 2017-03-09 | Discharge: 2017-03-09 | Disposition: A | Discharge status: Home / Self Care | Payer: Medicare Other | Source: Ambulatory Visit | Attending: Vascular Surgery | Admitting: Vascular Surgery

## 2017-03-09 ENCOUNTER — Ambulatory Visit (INDEPENDENT_AMBULATORY_CARE_PROVIDER_SITE_OTHER): Payer: Medicare Other | Admitting: Vascular Surgery

## 2017-03-09 VITALS — BP 100/57 | HR 94 | Temp 97.2°F | Resp 20 | Ht 60.0 in | Wt 126.6 lb

## 2017-03-09 DIAGNOSIS — R938 Abnormal findings on diagnostic imaging of other specified body structures: Secondary | ICD-10-CM | POA: Diagnosis not present

## 2017-03-09 DIAGNOSIS — R0989 Other specified symptoms and signs involving the circulatory and respiratory systems: Secondary | ICD-10-CM | POA: Diagnosis present

## 2017-03-09 DIAGNOSIS — E1151 Type 2 diabetes mellitus with diabetic peripheral angiopathy without gangrene: Secondary | ICD-10-CM | POA: Insufficient documentation

## 2017-03-09 DIAGNOSIS — I739 Peripheral vascular disease, unspecified: Secondary | ICD-10-CM

## 2017-03-09 DIAGNOSIS — E785 Hyperlipidemia, unspecified: Secondary | ICD-10-CM | POA: Insufficient documentation

## 2017-03-09 NOTE — Progress Notes (Signed)
Vascular and Vein Specialist of [place]  Patient name: Janet Steele MRN: [id number] DOB: 03-14-1929 Sex: female  REASON FOR CONSULT: Evaluation of lower extremity arterial flow  HPI: Janet Steele is a 83 y.o. female, who is here today for discussion of lower from the arterial flow. She is very active 83 year old female. She reports some unsteadiness in her walking and had a recent fall. She reports some continued soreness from this but apparently no major injury. She does not do a great deal of walking. She does not have any typical claudication symptoms with walking. She does have what sounds like neuropathic pain in her feet but no true rest pain. She does have a significant history of prior coronary bypass grafting 2  Past Medical History:  Diagnosis Date  . Anemia   . Anxiety   . Arthritis    "fingers" (05/19/2016  . CAD (coronary artery disease)    a. s/p CABG 1982 b. redo CABG 2003. c. [place] despite med rx 05/2016: successful but complicated PCI of PDA beyond graft site, c/b localized dissection requiring overlapping stent.  . Carotid bruit    a. Duplex 01/2015: patent vessels, 1-39% BICA, f/u PRN recommened.  . Colon polyp    adenomatous  . Colon, diverticulosis   . Degenerative joint disease   . Diastolic dysfunction   . Esophageal stricture   . GERD (gastroesophageal reflux disease)   . Hiatal hernia   . History of blood transfusion    "related to my bypass"  . Hyperlipidemia   . Hypertension   . Hypothyroidism   . Ischemic cardiomyopathy    a. Cath 05/2016: EF 40% with severe inferior wall HK, suspect hibernating myocardium.  . Myocardial infarction 1977; 1982  . Osteoarthrosis, unspecified whether generalized or localized, unspecified site   . Presence of permanent cardiac pacemaker   . PVD (peripheral vascular disease) ([place])   . Type II diabetes mellitus ([place])   . Unspecified hearing loss     Family History  Problem  Relation Age of Onset  . Heart disease Sister   . Heart attack Sister   . Colon cancer Neg Hx   . Breast cancer Neg Hx   . Celiac disease Neg Hx   . Cirrhosis Neg Hx   . Clotting disorder Neg Hx   . Colitis Neg Hx   . Colon polyps Neg Hx   . Crohn's disease Neg Hx   . Cystic fibrosis Neg Hx   . Diabetes Neg Hx   . Esophageal cancer Neg Hx   . Hemochromatosis Neg Hx   . Inflammatory bowel disease Neg Hx   . Irritable bowel syndrome Neg Hx   . Kidney disease Neg Hx   . Liver cancer Neg Hx   . Liver disease Neg Hx   . Ovarian cancer Neg Hx   . Pancreatic cancer Neg Hx   . Prostate cancer Neg Hx   . Rectal cancer Neg Hx   . Stomach cancer Neg Hx   . Ulcerative colitis Neg Hx   . Uterine cancer Neg Hx   . Wilson's disease Neg Hx     SOCIAL HISTORY: Social History   Social History  . Marital status: Married    Spouse name: [name]  . Number of children: 3  . Years of education: 16   Occupational History  . Retired    .  Retired   Social History Main Topics  . Smoking status: Former Smoker    Packs/day:  0.50    Years: 30.00    Types: Cigarettes    Quit date: 07/05/1976  . Smokeless tobacco: Never Used  . Alcohol use 1.8 oz/week    3 Glasses of wine per week  . Drug use: No  . Sexual activity: No   Other Topics Concern  . Not on file   Social History Narrative   Patient is married [name]) and lives at home with her husband.   Patient has three adult children.   Patient is retired.   Patient has a college education.   Patient is right-handed.   Patient does not drink any caffeine.    Allergies  Allergen Reactions  . Morphine Hives, Itching and Rash  . Penicillins Itching and Rash    Has patient had a PCN reaction causing immediate rash, facial/tongue/throat swelling, SOB or lightheadedness with hypotension: Yes Has patient had a PCN reaction causing severe rash involving mucus membranes or skin necrosis: No Has patient had a PCN reaction that required  hospitalization No Has patient had a PCN reaction occurring within the last 10 years: No If all of the above answers are "NO", then may proceed with Cephalosporin use.   . Prednisone Other (See Comments)    MEMORY LOSS   . Sulfonamide Derivatives Itching and Rash  . Codeine Rash  . Hydrocodone-Acetaminophen Other (See Comments)    unknown  . Meperidine Hcl Itching  . Metoclopramide Hcl Itching and Rash  . Metoprolol Succinate Other (See Comments)    nervous  . Moxifloxacin Itching  . Oxycodone-Aspirin Rash  . Pentazocine Lactate Nausea Only  . Pioglitazone Other (See Comments)    bloating    Current Outpatient Prescriptions  Medication Sig Dispense Refill  . amLODipine (NORVASC) 5 MG tablet Take 1 tablet (5 mg total) by mouth daily. 30 tablet 6  . aspirin 81 MG EC tablet Take 1 tablet (81 mg total) by mouth daily. 30 tablet 11  . azithromycin (ZITHROMAX) 250 MG tablet Take two tabs the first day and then one tab daily for four days 6 tablet 0  . clopidogrel (PLAVIX) 75 MG tablet Take 1 tablet (75 mg total) by mouth daily. 30 tablet 6  . DULoxetine (CYMBALTA) 60 MG capsule take 1 capsule by mouth once daily 90 capsule 1  . gabapentin (NEURONTIN) 100 MG capsule take 1 capsule by mouth at bedtime (CAN INCREASE TO 2 PILLS AT BEDTIME AFTER A FEW DAYS) 180 capsule 1  . glimepiride (AMARYL) 2 MG tablet Take 2 mg by mouth daily before breakfast.     . levothyroxine (SYNTHROID, LEVOTHROID) 150 MCG tablet Take 1 tablet (150 mcg total) by mouth daily. 90 tablet 1  . losartan (COZAAR) 50 MG tablet Take 1 tablet (50 mg total) by mouth daily. 30 tablet 5  . metFORMIN (GLUCOPHAGE) 1000 MG tablet Take 1 tablet (1,000 mg total) by mouth 2 (two) times daily with a meal. 180 tablet 1  . metoprolol tartrate (LOPRESSOR) 25 MG tablet Take 1 tablet (25 mg total) by mouth 2 (two) times daily. 60 tablet 6  . mupirocin ointment (BACTROBAN) 2 %     . nitroGLYCERIN (NITROSTAT) 0.4 MG SL tablet Place 1 tablet  (0.4 mg total) under the tongue every 5 (five) minutes as needed. For chest pain 25 tablet 5  . pantoprazole (PROTONIX) 40 MG tablet Take 1 tablet (40 mg total) by mouth 2 (two) times daily. 60 tablet 6  . rosuvastatin (CRESTOR) 40 MG tablet Take 1 tablet (40 mg total)  by mouth daily. 90 tablet 1  . sitaGLIPtin (JANUVIA) 50 MG tablet Take 1 tablet (50 mg total) by mouth daily. 90 tablet 0  . traMADol (ULTRAM) 50 MG tablet Take 50 mg by mouth every 6 (six) hours as needed for moderate pain (back pain).   0  . traZODone (DESYREL) 50 MG tablet Take 1.5 tablets (75 mg total) by mouth at bedtime as needed for sleep. 45 tablet 11   No current facility-administered medications for this visit.     REVIEW OF SYSTEMS:  [X]  denotes positive finding, [ ]  denotes negative finding Cardiac  Comments:  Chest pain or chest pressure: x   Shortness of breath upon exertion:    Short of breath when lying flat:    Irregular heart rhythm:        Vascular    Pain in calf, thigh, or hip brought on by ambulation:    Pain in feet at night that wakes you up from your sleep:  x   Blood clot in your veins:    Leg swelling:         Pulmonary    Oxygen at home:    Productive cough:     Wheezing:         Neurologic    Sudden weakness in arms or legs:     Sudden numbness in arms or legs:     Sudden onset of difficulty speaking or slurred speech:    Temporary loss of vision in one eye:     Problems with dizziness:         Gastrointestinal    Blood in stool:     Vomited blood:         Genitourinary    Burning when urinating:     Blood in urine:        Psychiatric    Major depression:         Hematologic    Bleeding problems:    Problems with blood clotting too easily:        Skin    Rashes or ulcers:        Constitutional    Fever or chills:      PHYSICAL EXAM: Vitals:   03/09/17 1319  BP: (!) 100/57  Pulse: 94  Resp: 20  Temp: 97.2 F (36.2 C)  TempSrc: Oral  SpO2: (!) 87%  Weight:  126 lb 9.6 oz (57.4 kg)  Height: 5' (1.524 m)    GENERAL: The patient is a well-nourished female, in no acute distress. The vital signs are documented above. CARDIOVASCULAR: Carotid arteries without bruits bilaterally. 2+ radial and 2+ femoral pulses. I do not palpate pedal pulses. PULMONARY: There is good air exchange  ABDOMEN: Soft and non-tender  MUSCULOSKELETAL: There are no major deformities or cyanosis. NEUROLOGIC: No focal weakness or paresthesias are detected. SKIN: There are no ulcers or rashes noted. No ulcerations are healing difficulty in her feet PSYCHIATRIC: The patient has a normal affect.  DATA:  Lower from his noninvasive studies reveal ankle arm index of 0.65 on the right and 0.67 on the left. She does have biphasic flow at the dorsalis pedis level bilaterally  MEDICAL ISSUES: Patient denies any claudication symptoms and does not have arterial rest pain. She has no tissue loss. I did explain that she apparently has SFA occlusive disease but would not recommend any further evaluation or treatment since she is asymptomatic. I did explain the importance of good foot care and notify should she develop  any healing difficulty. Otherwise she will see [place] again on as-needed basis   Rosetta Posner, MD [facility] Vascular and Vein Specialists of [facility] Tel [phone] Pager [phone]

## 2017-03-11 ENCOUNTER — Ambulatory Visit (INDEPENDENT_AMBULATORY_CARE_PROVIDER_SITE_OTHER)
Admission: RE | Admit: 2017-03-11 | Discharge: 2017-03-11 | Disposition: A | Discharge status: Home / Self Care | Payer: Medicare Other | Source: Ambulatory Visit | Attending: Nurse Practitioner | Admitting: Nurse Practitioner

## 2017-03-11 ENCOUNTER — Encounter: Payer: Self-pay | Admitting: Nurse Practitioner

## 2017-03-11 ENCOUNTER — Ambulatory Visit (INDEPENDENT_AMBULATORY_CARE_PROVIDER_SITE_OTHER): Payer: Medicare Other | Admitting: Nurse Practitioner

## 2017-03-11 ENCOUNTER — Other Ambulatory Visit: Payer: Medicare Other

## 2017-03-11 VITALS — BP 108/60 | HR 64 | Temp 97.3°F | Ht 60.0 in | Wt 125.0 lb

## 2017-03-11 DIAGNOSIS — S2242XA Multiple fractures of ribs, left side, initial encounter for closed fracture: Secondary | ICD-10-CM | POA: Diagnosis not present

## 2017-03-11 DIAGNOSIS — J90 Pleural effusion, not elsewhere classified: Secondary | ICD-10-CM

## 2017-03-11 DIAGNOSIS — R0789 Other chest pain: Secondary | ICD-10-CM | POA: Diagnosis not present

## 2017-03-11 DIAGNOSIS — Y92009 Unspecified place in unspecified non-institutional (private) residence as the place of occurrence of the external cause: Principal | ICD-10-CM

## 2017-03-11 DIAGNOSIS — W19XXXA Unspecified fall, initial encounter: Secondary | ICD-10-CM

## 2017-03-11 DIAGNOSIS — Y92099 Unspecified place in other non-institutional residence as the place of occurrence of the external cause: Secondary | ICD-10-CM

## 2017-03-11 MED ORDER — DOXYCYCLINE MONOHYDRATE 100 MG PO TABS: 100.0000 mg | ORAL_TABLET | Freq: Two times a day (BID) | ORAL | 0 refills | Status: DC

## 2017-03-11 NOTE — Progress Notes (Signed)
Pre visit review using our clinic review tool, if applicable. No additional management support is needed unless otherwise documented below in the visit note. 

## 2017-03-11 NOTE — Patient Instructions (Addendum)
Continue tramadol and tylenol for pain.  You will be called with results.  Use pillow to splint chest wall.  Encourage deep breathing exercise 10repetitions 3times a day.

## 2017-03-11 NOTE — Progress Notes (Signed)
Subjective:  Patient ID: Janet Steele, female    DOB: May 24, 1929  Age: 83 y.o. MRN: [id number]  CC: Pain (fell a week ago, pain on left side body. )   Chest Pain   This is a new problem. The current episode started in the past 7 days. The onset quality is sudden. The problem occurs constantly. The problem has been gradually worsening. The pain is present in the lateral region (left chest wall). The pain is at a severity of 10/10. The quality of the pain is described as sharp. The pain does not radiate. Pertinent negatives include no back pain, cough, dizziness, fever, irregular heartbeat, orthopnea, palpitations, shortness of breath, syncope or weakness. Associated symptoms comments: Difficulty with deep breathing and twisting due to chest wall pain. Treatments tried: tramadol. The treatment provided moderate relief. Risk factors include post-menopausal and sedentary lifestyle (fall 1week ago at home (tripped over her shoes)).  fall 1week ago at home (tripped over her shoes)  Outpatient Medications Prior to Visit  Medication Sig Dispense Refill  . amLODipine (NORVASC) 5 MG tablet Take 1 tablet (5 mg total) by mouth daily. 30 tablet 6  . aspirin 81 MG EC tablet Take 1 tablet (81 mg total) by mouth daily. 30 tablet 11  . clopidogrel (PLAVIX) 75 MG tablet Take 1 tablet (75 mg total) by mouth daily. 30 tablet 6  . DULoxetine (CYMBALTA) 60 MG capsule take 1 capsule by mouth once daily 90 capsule 1  . gabapentin (NEURONTIN) 100 MG capsule take 1 capsule by mouth at bedtime (CAN INCREASE TO 2 PILLS AT BEDTIME AFTER A FEW DAYS) 180 capsule 1  . glimepiride (AMARYL) 2 MG tablet Take 2 mg by mouth daily before breakfast.     . levothyroxine (SYNTHROID, LEVOTHROID) 150 MCG tablet Take 1 tablet (150 mcg total) by mouth daily. 90 tablet 1  . losartan (COZAAR) 50 MG tablet Take 1 tablet (50 mg total) by mouth daily. 30 tablet 5  . metFORMIN (GLUCOPHAGE) 1000 MG tablet Take 1 tablet (1,000 mg total) by  mouth 2 (two) times daily with a meal. 180 tablet 1  . metoprolol tartrate (LOPRESSOR) 25 MG tablet Take 1 tablet (25 mg total) by mouth 2 (two) times daily. 60 tablet 6  . mupirocin ointment (BACTROBAN) 2 %     . nitroGLYCERIN (NITROSTAT) 0.4 MG SL tablet Place 1 tablet (0.4 mg total) under the tongue every 5 (five) minutes as needed. For chest pain 25 tablet 5  . pantoprazole (PROTONIX) 40 MG tablet Take 1 tablet (40 mg total) by mouth 2 (two) times daily. 60 tablet 6  . rosuvastatin (CRESTOR) 40 MG tablet Take 1 tablet (40 mg total) by mouth daily. 90 tablet 1  . sitaGLIPtin (JANUVIA) 50 MG tablet Take 1 tablet (50 mg total) by mouth daily. 90 tablet 0  . traMADol (ULTRAM) 50 MG tablet Take 50 mg by mouth every 6 (six) hours as needed for moderate pain (back pain).   0  . traZODone (DESYREL) 50 MG tablet Take 1.5 tablets (75 mg total) by mouth at bedtime as needed for sleep. 45 tablet 11  . azithromycin (ZITHROMAX) 250 MG tablet Take two tabs the first day and then one tab daily for four days (Patient not taking: Reported on 03/11/2017) 6 tablet 0   No facility-administered medications prior to visit.     ROS See HPI  Objective:  BP 108/60   Pulse 64   Temp 97.3 F (36.3 C)   Ht  5' (1.524 m)   Wt 125 lb (56.7 kg)   SpO2 94%   BMI 24.41 kg/m   BP Readings from Last 3 Encounters:  03/11/17 108/60  03/09/17 (!) 100/57  01/17/17 124/70    Wt Readings from Last 3 Encounters:  03/11/17 125 lb (56.7 kg)  03/09/17 126 lb 9.6 oz (57.4 kg)  01/17/17 128 lb (58.1 kg)    Physical Exam  Constitutional: She is oriented to person, place, and time. No distress.  Cardiovascular: Normal rate and regular rhythm.   Pulmonary/Chest: Effort normal. No respiratory distress. She has no wheezes. She has no rales. She exhibits tenderness.  Diminished lung sounds in LLL  Neurological: She is alert and oriented to person, place, and time.  Vitals reviewed.   Lab Results  Component Value Date     WBC 6.9 05/25/2016   HGB 13.6 05/25/2016   HCT 39.7 05/25/2016   PLT 359 05/25/2016   GLUCOSE 383 (H) 01/17/2017   CHOL 140 01/17/2017   TRIG 214.0 (H) 01/17/2017   HDL 27.40 (L) 01/17/2017   LDLDIRECT 93.0 01/17/2017   LDLCALC 178 (H) 12/17/2016   ALT 12 01/17/2017   AST 17 01/17/2017   NA 134 (L) 01/17/2017   K 3.9 01/17/2017   CL 99 01/17/2017   CREATININE 1.08 01/17/2017   BUN 15 01/17/2017   CO2 27 01/17/2017   TSH 0.81 01/17/2017   INR 1.0 05/18/2016   HGBA1C 9.2 (H) 01/17/2017   MICROALBUR 3.6 (H) 05/28/2010    Dg Ribs Unilateral Left  Result Date: 03/11/2017 CLINICAL DATA:  Fall 7 days ago with left flank pain EXAM: LEFT RIBS - 2 VIEW COMPARISON:  03/02/2014 FINDINGS: Left-sided duo lead pacing device as before. Post sternotomy changes. Small left pleural effusion. No left pneumothorax. Atherosclerosis. Acute left fourth and fifth rib fractures. IMPRESSION: 1. Acute left fourth and fifth rib fractures. Small amount of left pleural effusion. No pneumothorax. Electronically Signed   By: Donavan Foil M.D.   On: 03/11/2017 16:05    Assessment & Plan:   Janet Steele was seen today for pain.  Diagnoses and all orders for this visit:  Fall at home, initial encounter -     DG Ribs Unilateral Left; Future -     doxycycline (ADOXA) 100 MG tablet; Take 1 tablet (100 mg total) by mouth 2 (two) times daily.  Closed fracture of multiple ribs of left side, initial encounter -     DG Ribs Unilateral Left; Future -     doxycycline (ADOXA) 100 MG tablet; Take 1 tablet (100 mg total) by mouth 2 (two) times daily.   I have discontinued Janet Steele's azithromycin. I am also having her start on doxycycline. Additionally, I am having her maintain her glimepiride, traMADol, losartan, metoprolol tartrate, clopidogrel, nitroGLYCERIN, aspirin, amLODipine, pantoprazole, DULoxetine, mupirocin ointment, metFORMIN, levothyroxine, rosuvastatin, traZODone, sitaGLIPtin, and gabapentin.  Meds ordered  this encounter  Medications  . doxycycline (ADOXA) 100 MG tablet    Sig: Take 1 tablet (100 mg total) by mouth 2 (two) times daily.    Dispense:  14 tablet    Refill:  0    Order Specific Question:   Supervising Provider    Answer:   Cassandria Anger [[id number]]    Follow-up: Return if symptoms worsen or fail to improve.  Wilfred Lacy, NP

## 2017-03-17 DIAGNOSIS — Z0289 Encounter for other administrative examinations: Secondary | ICD-10-CM

## 2017-03-21 ENCOUNTER — Ambulatory Visit (INDEPENDENT_AMBULATORY_CARE_PROVIDER_SITE_OTHER): Payer: Medicare Other | Admitting: Nurse Practitioner

## 2017-03-21 ENCOUNTER — Encounter: Payer: Self-pay | Admitting: Nurse Practitioner

## 2017-03-21 VITALS — BP 130/74 | HR 65 | Temp 97.8°F | Ht 60.0 in | Wt 124.0 lb

## 2017-03-21 DIAGNOSIS — W19XXXD Unspecified fall, subsequent encounter: Secondary | ICD-10-CM | POA: Diagnosis not present

## 2017-03-21 DIAGNOSIS — S2242XA Multiple fractures of ribs, left side, initial encounter for closed fracture: Secondary | ICD-10-CM

## 2017-03-21 DIAGNOSIS — J90 Pleural effusion, not elsewhere classified: Secondary | ICD-10-CM | POA: Diagnosis not present

## 2017-03-21 MED ORDER — ACETAMINOPHEN 500 MG PO TABS: 1000.0000 mg | ORAL_TABLET | Freq: Four times a day (QID) | ORAL | 0 refills | Status: DC | PRN

## 2017-03-21 MED ORDER — TRAMADOL HCL 50 MG PO TABS: 50.0000 mg | ORAL_TABLET | Freq: Four times a day (QID) | ORAL | 0 refills | Status: DC | PRN

## 2017-03-21 NOTE — Progress Notes (Signed)
Pre visit review using our clinic review tool, if applicable. No additional management support is needed unless otherwise documented below in the visit note. 

## 2017-03-21 NOTE — Patient Instructions (Addendum)
Complete oral abx as prescribed.  We faxed tramadol prescription to your pharmacy.  Return to basement for repeat CXR in 1week (03/28/2017).

## 2017-03-21 NOTE — Progress Notes (Signed)
Subjective:  Patient ID: Janet Steele, female    DOB: 1929-06-20  Age: 83 y.o. MRN: [id number]  CC: Pain (body pain, still bad/never got tramadol)   HPI Left Chest wall pain: Janet Steele returns with persistent left chest wall pain secondary to fall at home over 1week ago. Eventhough she had told me she had tramadol tablets at home, today she informs me she does not have any. She has been using tylenol and splinting for pain management with no improvement. She has been taking doxycycline as prescribed. She denies any fever or increased SOB or diaphoresis. Pain is worse with palpation and laying on left side.  Outpatient Medications Prior to Visit  Medication Sig Dispense Refill  . amLODipine (NORVASC) 5 MG tablet Take 1 tablet (5 mg total) by mouth daily. 30 tablet 6  . aspirin 81 MG EC tablet Take 1 tablet (81 mg total) by mouth daily. 30 tablet 11  . clopidogrel (PLAVIX) 75 MG tablet Take 1 tablet (75 mg total) by mouth daily. 30 tablet 6  . doxycycline (ADOXA) 100 MG tablet Take 1 tablet (100 mg total) by mouth 2 (two) times daily. 14 tablet 0  . DULoxetine (CYMBALTA) 60 MG capsule take 1 capsule by mouth once daily 90 capsule 1  . gabapentin (NEURONTIN) 100 MG capsule take 1 capsule by mouth at bedtime (CAN INCREASE TO 2 PILLS AT BEDTIME AFTER A FEW DAYS) 180 capsule 1  . glimepiride (AMARYL) 2 MG tablet Take 2 mg by mouth daily before breakfast.     . levothyroxine (SYNTHROID, LEVOTHROID) 150 MCG tablet Take 1 tablet (150 mcg total) by mouth daily. 90 tablet 1  . losartan (COZAAR) 50 MG tablet Take 1 tablet (50 mg total) by mouth daily. 30 tablet 5  . metFORMIN (GLUCOPHAGE) 1000 MG tablet Take 1 tablet (1,000 mg total) by mouth 2 (two) times daily with a meal. 180 tablet 1  . metoprolol tartrate (LOPRESSOR) 25 MG tablet Take 1 tablet (25 mg total) by mouth 2 (two) times daily. 60 tablet 6  . mupirocin ointment (BACTROBAN) 2 %     . nitroGLYCERIN (NITROSTAT) 0.4 MG SL tablet Place 1  tablet (0.4 mg total) under the tongue every 5 (five) minutes as needed. For chest pain 25 tablet 5  . pantoprazole (PROTONIX) 40 MG tablet Take 1 tablet (40 mg total) by mouth 2 (two) times daily. 60 tablet 6  . rosuvastatin (CRESTOR) 40 MG tablet Take 1 tablet (40 mg total) by mouth daily. 90 tablet 1  . sitaGLIPtin (JANUVIA) 50 MG tablet Take 1 tablet (50 mg total) by mouth daily. 90 tablet 0  . traZODone (DESYREL) 50 MG tablet Take 1.5 tablets (75 mg total) by mouth at bedtime as needed for sleep. 45 tablet 11  . traMADol (ULTRAM) 50 MG tablet Take 50 mg by mouth every 6 (six) hours as needed for moderate pain (back pain).   0   No facility-administered medications prior to visit.     ROS See HPI  Objective:  BP 130/74   Pulse 65   Temp 97.8 F (36.6 C)   Ht 5' (1.524 m)   Wt 124 lb (56.2 kg)   SpO2 97%   BMI 24.22 kg/m   BP Readings from Last 3 Encounters:  03/21/17 130/74  03/11/17 108/60  03/09/17 (!) 100/57    Wt Readings from Last 3 Encounters:  03/21/17 124 lb (56.2 kg)  03/11/17 125 lb (56.7 kg)  03/09/17 126 lb 9.6 oz (  57.4 kg)    Physical Exam  Constitutional: She is oriented to person, place, and time. No distress.  Cardiovascular: Normal rate and normal heart sounds.   Pulmonary/Chest: Effort normal. No respiratory distress. She has no wheezes. She has no rales. She exhibits tenderness.  Abdominal: Soft. She exhibits no distension.  Neurological: She is alert and oriented to person, place, and time.  Vitals reviewed.   Lab Results  Component Value Date   WBC 6.9 05/25/2016   HGB 13.6 05/25/2016   HCT 39.7 05/25/2016   PLT 359 05/25/2016   GLUCOSE 383 (H) 01/17/2017   CHOL 140 01/17/2017   TRIG 214.0 (H) 01/17/2017   HDL 27.40 (L) 01/17/2017   LDLDIRECT 93.0 01/17/2017   LDLCALC 178 (H) 12/17/2016   ALT 12 01/17/2017   AST 17 01/17/2017   NA 134 (L) 01/17/2017   K 3.9 01/17/2017   CL 99 01/17/2017   CREATININE 1.08 01/17/2017   BUN 15  01/17/2017   CO2 27 01/17/2017   TSH 0.81 01/17/2017   INR 1.0 05/18/2016   HGBA1C 9.2 (H) 01/17/2017   MICROALBUR 3.6 (H) 05/28/2010    Dg Ribs Unilateral Left  Result Date: 03/11/2017 CLINICAL DATA:  Fall 7 days ago with left flank pain EXAM: LEFT RIBS - 2 VIEW COMPARISON:  03/02/2014 FINDINGS: Left-sided duo lead pacing device as before. Post sternotomy changes. Small left pleural effusion. No left pneumothorax. Atherosclerosis. Acute left fourth and fifth rib fractures. IMPRESSION: 1. Acute left fourth and fifth rib fractures. Small amount of left pleural effusion. No pneumothorax. Electronically Signed   By: Donavan Foil M.D.   On: 03/11/2017 16:05    Assessment & Plan:   Janet Steele was seen today for pain.  Diagnoses and all orders for this visit:  Fall, subsequent encounter -     traMADol (ULTRAM) 50 MG tablet; Take 1 tablet (50 mg total) by mouth every 6 (six) hours as needed for moderate pain (back pain). -     acetaminophen (TYLENOL) 500 MG tablet; Take 2 tablets (1,000 mg total) by mouth every 6 (six) hours as needed. -     DG Chest 2 View; Future  Closed fracture of multiple ribs of left side, initial encounter -     traMADol (ULTRAM) 50 MG tablet; Take 1 tablet (50 mg total) by mouth every 6 (six) hours as needed for moderate pain (back pain). -     acetaminophen (TYLENOL) 500 MG tablet; Take 2 tablets (1,000 mg total) by mouth every 6 (six) hours as needed. -     DG Chest 2 View; Future  Pleural effusion on left -     DG Chest 2 View; Future   I have changed Janet Steele's traMADol. I am also having her start on acetaminophen. Additionally, I am having her maintain her glimepiride, losartan, metoprolol tartrate, clopidogrel, nitroGLYCERIN, aspirin, amLODipine, pantoprazole, DULoxetine, mupirocin ointment, metFORMIN, levothyroxine, rosuvastatin, traZODone, sitaGLIPtin, gabapentin, and doxycycline.  Meds ordered this encounter  Medications  . traMADol (ULTRAM) 50 MG tablet      Sig: Take 1 tablet (50 mg total) by mouth every 6 (six) hours as needed for moderate pain (back pain).    Dispense:  20 tablet    Refill:  0    Order Specific Question:   Supervising Provider    Answer:   Cassandria Anger [[id number]]  . acetaminophen (TYLENOL) 500 MG tablet    Sig: Take 2 tablets (1,000 mg total) by mouth every 6 (six) hours as needed.  Dispense:  30 tablet    Refill:  0    Order Specific Question:   Supervising Provider    Answer:   Cassandria Anger [[id number]]    Follow-up: No Follow-up on file.  Wilfred Lacy, NP

## 2017-03-26 ENCOUNTER — Ambulatory Visit (INDEPENDENT_AMBULATORY_CARE_PROVIDER_SITE_OTHER): Payer: Medicare Other | Admitting: Internal Medicine

## 2017-03-26 ENCOUNTER — Encounter: Payer: Self-pay | Admitting: Internal Medicine

## 2017-03-26 DIAGNOSIS — R200 Anesthesia of skin: Secondary | ICD-10-CM

## 2017-03-26 DIAGNOSIS — R202 Paresthesia of skin: Secondary | ICD-10-CM

## 2017-03-27 NOTE — Assessment & Plan Note (Signed)
Left and was not seen

## 2017-03-27 NOTE — Progress Notes (Signed)
   Subjective:    Patient ID: Janet Steele, female    DOB: 1928-12-09, 83 y.o.   MRN: [id number]  HPI Left and was not seen   Review of Systems     Objective:   Physical Exam        Assessment & Plan:

## 2017-03-30 ENCOUNTER — Telehealth: Payer: Self-pay | Admitting: Internal Medicine

## 2017-03-30 DIAGNOSIS — S2242XA Multiple fractures of ribs, left side, initial encounter for closed fracture: Secondary | ICD-10-CM

## 2017-03-30 DIAGNOSIS — W19XXXD Unspecified fall, subsequent encounter: Secondary | ICD-10-CM

## 2017-03-30 MED ORDER — TRAMADOL HCL 50 MG PO TABS: 50.0000 mg | ORAL_TABLET | Freq: Four times a day (QID) | ORAL | 0 refills | Status: DC | PRN

## 2017-03-30 NOTE — Telephone Encounter (Signed)
RX faxed to [facility] 

## 2017-03-30 NOTE — Telephone Encounter (Signed)
She had been getting her tramadol regularly from dr pool -but I can prescribe.  [place] controlled substance database checked.  Ok to fill medication.   rx printed

## 2017-03-30 NOTE — Telephone Encounter (Signed)
Received refill request from [facility] store on [address] requesting for Tramadol  HCL 50 mg tab. Baldo Ash only her for acute visit.   Please advise.

## 2017-03-30 NOTE — Telephone Encounter (Signed)
Please advise 

## 2017-04-05 ENCOUNTER — Ambulatory Visit (INDEPENDENT_AMBULATORY_CARE_PROVIDER_SITE_OTHER): Payer: Medicare Other | Admitting: Adult Health

## 2017-04-05 ENCOUNTER — Encounter: Payer: Self-pay | Admitting: Adult Health

## 2017-04-05 VITALS — BP 90/52 | HR 88 | Ht 60.0 in | Wt 125.8 lb

## 2017-04-05 DIAGNOSIS — R413 Other amnesia: Secondary | ICD-10-CM

## 2017-04-05 NOTE — Progress Notes (Signed)
PATIENT: Janet Steele DOB: [birth year]  REASON FOR VISIT: follow up- memory HISTORY FROM: patient  HISTORY OF PRESENT ILLNESS: [name] is an 83 year old female with a history of memory disturbance. She returns today for follow-up. She feels that her memory may be a "tad better." She states that she is in the process of moving to [place]. 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 Denies getting lost. Reports that she is able to cook without difficulty. Reports good appetite. She continues to use trazodone for sleep although she does not feel that it's beneficial. She states that she is currently under a lot of stress with the move. She does report that she had a fall several weeks ago. She states that she tripped over her shoes. She did fracture 2 ribs, bruised her right leg and right hand. She also reports she developed pneumonia. She states that she has slowly recovered. She returns today for an evaluation.  HISTORY 01/04/17: [name] is an 83 year old female with a history of memory disturbance. She returns today for follow-up. Her husband made this appointment after her memory was worse in the last month. The patient states that her memory has been fine. She states that she was placed on Keflex and while she was on this medication she did feel very strange. She finally stopped the medication & gave  back to Dr. Ronnald Ramp. She states that she is able to complete all ADLs independently. She operates a Teacher, music without difficulty. She states that she continues to have trouble falling asleep. She is currently taking trazodone 50 mg at bedtime. Reports good appetite. While the patient was in the room I called her husband. He states that she was acting confused while on Keflex. He states in the last 1-2 weeks this has improved. He states that while she was on this medication she would come to their bedroom and not know where she was. He [place] children noticed that she was saying very odd things. He agrees that she is able to do all ADLs independently. He reports that he does all the cooking mainly because he likes to cook. He also notes that she has trouble sleeping at night but does report that she sleeps a lot during the day. She returns today for an evaluation.  HISTORY 11/17/16: [name] is an 83 year old female with a history of insomnia and mild cognitive impairment. She returns today for follow-up. She reports that she continues to use trazodone with good benefit. She states that some nights she does have a hard time going to sleep but overall it works well for her. She states at home she is able to complete all ADLs independently. She operates a Teacher, music without difficulty. She states that she has only gotten lost on one occasion. She states her husband completes all the finances. She still prepare some meals. Denies any difficulty with this task. Overall she feels that her memory may have gotten slightly better. She does have impaired hearing. She returns today for an evaluation.  REVIEW OF SYSTEMS: Out of a complete 14 system review of symptoms, the patient complains only of the following symptoms, and all other reviewed systems are negative.  See history of present illness  ALLERGIES: Allergies  Allergen Reactions  . Morphine Hives, Itching and Rash  . Penicillins Itching and Rash    Has patient had a PCN reaction causing immediate rash, facial/tongue/throat swelling, SOB  or lightheadedness with hypotension: Yes Has patient had a PCN reaction causing severe rash involving mucus membranes or skin necrosis: No Has patient had a PCN reaction that required hospitalization No Has patient had a PCN reaction occurring within the last 10 years: No If all of the above answers are "NO", then may proceed with Cephalosporin use.   . Prednisone Other (See Comments)    MEMORY LOSS   . Sulfonamide Derivatives Itching and Rash    . Codeine Rash  . Hydrocodone-Acetaminophen Other (See Comments)    unknown  . Meperidine Hcl Itching  . Metoclopramide Hcl Itching and Rash  . Metoprolol Succinate Other (See Comments)    nervous  . Moxifloxacin Itching  . Oxycodone-Aspirin Rash  . Pentazocine Lactate Nausea Only  . Pioglitazone Other (See Comments)    bloating    HOME MEDICATIONS: Outpatient Medications Prior to Visit  Medication Sig Dispense Refill  . acetaminophen (TYLENOL) 500 MG tablet Take 2 tablets (1,000 mg total) by mouth every 6 (six) hours as needed. 30 tablet 0  . amLODipine (NORVASC) 5 MG tablet Take 1 tablet (5 mg total) by mouth daily. 30 tablet 6  . aspirin 81 MG EC tablet Take 1 tablet (81 mg total) by mouth daily. 30 tablet 11  . clopidogrel (PLAVIX) 75 MG tablet Take 1 tablet (75 mg total) by mouth daily. 30 tablet 6  . doxycycline (ADOXA) 100 MG tablet Take 1 tablet (100 mg total) by mouth 2 (two) times daily. 14 tablet 0  . DULoxetine (CYMBALTA) 60 MG capsule take 1 capsule by mouth once daily 90 capsule 1  . gabapentin (NEURONTIN) 100 MG capsule take 1 capsule by mouth at bedtime (CAN INCREASE TO 2 PILLS AT BEDTIME AFTER A FEW DAYS) 180 capsule 1  . glimepiride (AMARYL) 2 MG tablet Take 2 mg by mouth daily before breakfast.     . levothyroxine (SYNTHROID, LEVOTHROID) 150 MCG tablet Take 1 tablet (150 mcg total) by mouth daily. 90 tablet 1  . losartan (COZAAR) 50 MG tablet Take 1 tablet (50 mg total) by mouth daily. 30 tablet 5  . metFORMIN (GLUCOPHAGE) 1000 MG tablet Take 1 tablet (1,000 mg total) by mouth 2 (two) times daily with a meal. 180 tablet 1  . metoprolol tartrate (LOPRESSOR) 25 MG tablet Take 1 tablet (25 mg total) by mouth 2 (two) times daily. 60 tablet 6  . mupirocin ointment (BACTROBAN) 2 %     . nitroGLYCERIN (NITROSTAT) 0.4 MG SL tablet Place 1 tablet (0.4 mg total) under the tongue every 5 (five) minutes as needed. For chest pain 25 tablet 5  . pantoprazole (PROTONIX) 40 MG  tablet Take 1 tablet (40 mg total) by mouth 2 (two) times daily. 60 tablet 6  . rosuvastatin (CRESTOR) 40 MG tablet Take 1 tablet (40 mg total) by mouth daily. 90 tablet 1  . sitaGLIPtin (JANUVIA) 50 MG tablet Take 1 tablet (50 mg total) by mouth daily. 90 tablet 0  . traMADol (ULTRAM) 50 MG tablet Take 1 tablet (50 mg total) by mouth every 6 (six) hours as needed for moderate pain (back pain). 60 tablet 0  . traZODone (DESYREL) 50 MG tablet Take 1.5 tablets (75 mg total) by mouth at bedtime as needed for sleep. 45 tablet 11   No facility-administered medications prior to visit.     PAST MEDICAL HISTORY: Past Medical History:  Diagnosis Date  . Anemia   . Anxiety   . Arthritis    "fingers" (05/19/2016  .  CAD (coronary artery disease)    a. s/p CABG 1982 b. redo CABG 2003. c. [place] despite med rx 05/2016: successful but complicated PCI of PDA beyond graft site, c/b localized dissection requiring overlapping stent.  . Carotid bruit    a. Duplex 01/2015: patent vessels, 1-39% BICA, f/u PRN recommened.  . Colon polyp    adenomatous  . Colon, diverticulosis   . Degenerative joint disease   . Diastolic dysfunction   . Esophageal stricture   . GERD (gastroesophageal reflux disease)   . Hiatal hernia   . History of blood transfusion    "related to my bypass"  . Hyperlipidemia   . Hypertension   . Hypothyroidism   . Ischemic cardiomyopathy    a. Cath 05/2016: EF 40% with severe inferior wall HK, suspect hibernating myocardium.  . Myocardial infarction ([place]) 1977; 1982  . Osteoarthrosis, unspecified whether generalized or localized, unspecified site   . Presence of permanent cardiac pacemaker   . PVD (peripheral vascular disease) ([place])   . Type II diabetes mellitus ([place])   . Unspecified hearing loss     PAST SURGICAL HISTORY: Past Surgical History:  Procedure Laterality Date  . ABDOMINAL HYSTERECTOMY    . APPENDECTOMY    . BACK SURGERY    . CARDIAC CATHETERIZATION N/A 05/19/2016    Procedure: Left Heart Cath and Cors/Grafts Angiography;  Surgeon: Belva Crome, MD;  Location: [facility];  Service: Cardiovascular;  Laterality: N/A;  . CARDIAC CATHETERIZATION N/A 05/19/2016   Procedure: Coronary Stent Intervention;  Surgeon: Belva Crome, MD;  Location: [facility];  Service: Cardiovascular;  Laterality: N/A;  . CATARACT EXTRACTION W/ INTRAOCULAR LENS  IMPLANT, BILATERAL Bilateral   . CHOLECYSTECTOMY OPEN    . COLONOSCOPY    . CORONARY ANGIOPLASTY WITH STENT PLACEMENT  05/19/2016   "2 stents?"  . CORONARY ARTERY BYPASS GRAFT  1982; 2003   x4(1982),02-2002 CABG X2  . ESOPHAGOGASTRODUODENOSCOPY (EGD) WITH ESOPHAGEAL DILATION  "2-3 times"  . INSERT / REPLACE / REMOVE PACEMAKER    . LEFT HEART CATHETERIZATION WITH CORONARY/GRAFT ANGIOGRAM N/A 03/05/2014   Procedure: LEFT HEART CATHETERIZATION WITH Beatrix Fetters;  Surgeon: Sinclair Grooms, MD;  Location[facility];  Service: Cardiovascular;  Laterality: N/A;  . LUMBAR [place] SURGERY  01-2000  . OOPHORECTOMY Bilateral   . PERMANENT PACEMAKER INSERTION N/A 07/06/2012   MDT Adapta L implanted by Dr Rayann Heman for symptomatic bradycardia  . TONSILLECTOMY  1930s    FAMILY HISTORY: Family History  Problem Relation Age of Onset  . Heart disease Sister   . Heart attack Sister   . Colon cancer Neg Hx   . Breast cancer Neg Hx   . Celiac disease Neg Hx   . Cirrhosis Neg Hx   . Clotting disorder Neg Hx   . Colitis Neg Hx   . Colon polyps Neg Hx   . Crohn's disease Neg Hx   . Cystic fibrosis Neg Hx   . Diabetes Neg Hx   . Esophageal cancer Neg Hx   . Hemochromatosis Neg Hx   . Inflammatory bowel disease Neg Hx   . Irritable bowel syndrome Neg Hx   . Kidney disease Neg Hx   . Liver cancer Neg Hx   . Liver disease Neg Hx   . Ovarian cancer Neg Hx   . Pancreatic cancer Neg Hx   . Prostate cancer Neg Hx   . Rectal cancer Neg Hx   . Stomach cancer Neg Hx   .  Ulcerative colitis Neg Hx   . Uterine cancer  Neg Hx   . Wilson's disease Neg Hx     SOCIAL HISTORY: Social History   Social History  . Marital status: Married    Spouse name: [name]  . Number of children: 3  . Years of education: 16   Occupational History  . Retired    .  Retired   Social History Main Topics  . Smoking status: Former Smoker    Packs/day: 0.50    Years: 30.00    Types: Cigarettes    Quit date: 07/05/1976  . Smokeless tobacco: Never Used  . Alcohol use 1.8 oz/week    3 Glasses of wine per week  . Drug use: No  . Sexual activity: No   Other Topics Concern  . Not on file   Social History Narrative   Patient is married [name]) and lives at home with her husband.   Patient has three adult children.   Patient is retired.   Patient has a college education.   Patient is right-handed.   Patient does not drink any caffeine.      PHYSICAL EXAM  Vitals:   04/05/17 1327  BP: (!) 90/52  Pulse: 88  Weight: 125 lb 12.8 oz (57.1 kg)  Height: 5' (1.524 m)   Body mass index is 24.57 kg/m.   MMSE - Mini Mental State Exam 04/05/2017 01/17/2017 01/04/2017  Not completed: - - -  Orientation to time 5 5 3   Orientation to Place 4 5 5   Registration 3 2 3   Attention/ Calculation 1 2 5   Recall 1 2 3   Language- name 2 objects 2 2 2   Language- repeat 0 1 1  Language- follow 3 step command 3 2 3   Language- read & follow direction 1 1 1   Language-read & follow direction-comments - - -  Write a sentence 1 1 1   Copy design 1 1 1   Total score 22 24 28      Generalized: Well developed, in no acute distress   Neurological examination  Mentation: Alert.. Follows all commands speech and language fluent. Repetitive questions. Cranial nerve II-XII: Pupils were equal round reactive to light. Extraocular movements were full, visual field were full on confrontational test. Facial sensation and strength were normal. Uvula tongue midline. Head turning and shoulder shrug  were normal and symmetric. Motor: The motor  testing reveals 5 over 5 strength of all 4 extremities. Good symmetric motor tone is noted throughout.  Sensory: Sensory testing is intact to soft touch on all 4 extremities. No evidence of extinction is noted.  Coordination: Cerebellar testing reveals good finger-nose-finger and heel-to-shin bilaterally. I had to repeat the instructions several times for the patient.  Gait and station: Gait is normal.  Reflexes: Deep tendon reflexes are symmetric and normal bilaterally.   DIAGNOSTIC DATA (LABS, IMAGING, TESTING) - I reviewed patient records, labs, notes, testing and imaging myself where available.  Lab Results  Component Value Date   WBC 6.9 05/25/2016   HGB 13.6 05/25/2016   HCT 39.7 05/25/2016   MCV 83.2 05/25/2016   PLT 359 05/25/2016      Component Value Date/Time   NA 134 (L) 01/17/2017 1437   K 3.9 01/17/2017 1437   CL 99 01/17/2017 1437   CO2 27 01/17/2017 1437   GLUCOSE 383 (H) 01/17/2017 1437   BUN 15 01/17/2017 1437   CREATININE 1.08 01/17/2017 1437   CREATININE 0.91 (H) 05/25/2016 1027   CALCIUM 9.3 01/17/2017 1437  PROT 6.9 01/17/2017 1437   ALBUMIN 4.1 01/17/2017 1437   AST 17 01/17/2017 1437   ALT 12 01/17/2017 1437   ALKPHOS 143 (H) 01/17/2017 1437   BILITOT 0.7 01/17/2017 1437   GFRNONAA 59 (L) 05/20/2016 0313   GFRAA >60 05/20/2016 0313   Lab Results  Component Value Date   CHOL 140 01/17/2017   HDL 27.40 (L) 01/17/2017   LDLCALC 178 (H) 12/17/2016   LDLDIRECT 93.0 01/17/2017   TRIG 214.0 (H) 01/17/2017   CHOLHDL 5 01/17/2017   Lab Results  Component Value Date   HGBA1C 9.2 (H) 01/17/2017   Lab Results  Component Value Date   VITAMINB12 601 01/17/2017   Lab Results  Component Value Date   TSH 0.81 01/17/2017      ASSESSMENT AND PLAN 83 y.o. year old female  has a past medical history of Anemia; Anxiety; Arthritis; CAD (coronary artery disease); Carotid bruit; Colon polyp; Colon, diverticulosis; Degenerative joint disease; Diastolic  dysfunction; Esophageal stricture; GERD (gastroesophageal reflux disease); Hiatal hernia; History of blood transfusion; Hyperlipidemia; Hypertension; Hypothyroidism; Ischemic cardiomyopathy; Myocardial infarction ([place]) (1977; 1982); Osteoarthrosis, unspecified whether generalized or localized, unspecified site; Presence of permanent cardiac pacemaker; PVD (peripheral vascular disease) ([place]); Type II diabetes mellitus ([place]); and Unspecified hearing loss. here with:  1. Memory disturbance  The patient's memory score has declined since the last visit. She was also very repetitive during this office visit. I recommended we start the patient on Aricept 5 mg at bedtime. She is in agreement. I reviewed the side effects with the patient. She does have a pacemaker. Patient advised that if she is unable to tolerate this medication she should let us know. If tolerating in one month we will increase to 10 mg at bedtime. She will follow-up in 6 months or sooner if needed.  I spent 15 minutes with the patient 50% of this time was spent reviewing her memory score and new medication.  Ward Givens, MSN, NP-C 04/05/2017, 1:49 PM [facility] [address] [place], [place] [address] [phone]

## 2017-04-05 NOTE — Progress Notes (Signed)
I agree with the assessment and plan as directed by NP .The patient is known to me .   Chong Wojdyla, MD  

## 2017-04-05 NOTE — Patient Instructions (Signed)
Memory score is declined slightly  Start Aricept 5 mg at bedtime If your symptoms worsen or you develop new symptoms please let us know.   Donepezil tablets What is this medicine? DONEPEZIL (doe NEP e zil) is used to treat mild to moderate dementia caused by Alzheimer's disease. This medicine may be used for other purposes; ask your health care provider or pharmacist if you have questions. COMMON BRAND NAME(S): Aricept What should I tell my health care provider before I take this medicine? They need to know if you have any of these conditions: -asthma or other lung disease -difficulty passing urine -head injury -heart disease -history of irregular heartbeat -liver disease -seizures (convulsions) -stomach or intestinal disease, ulcers or stomach bleeding -an unusual or allergic reaction to donepezil, other medicines, foods, dyes, or preservatives -pregnant or trying to get pregnant -breast-feeding How should I use this medicine? Take this medicine by mouth with a glass of water. Follow the directions on the prescription label. You may take this medicine with or without food. Take this medicine at regular intervals. This medicine is usually taken before bedtime. Do not take it more often than directed. Continue to take your medicine even if you feel better. Do not stop taking except on your doctor's advice. If you are taking the 23 mg donepezil tablet, swallow it whole; do not cut, crush, or chew it. Talk to your pediatrician regarding the use of this medicine in children. Special care may be needed. Overdosage: If you think you have taken too much of this medicine contact a poison control center or emergency room at once. NOTE: This medicine is only for you. Do not share this medicine with others. What if I miss a dose? If you miss a dose, take it as soon as you can. If it is almost time for your next dose, take only that dose, do not take double or extra doses. What may interact with  this medicine? Do not take this medicine with any of the following medications: -certain medicines for fungal infections like itraconazole, fluconazole, posaconazole, and voriconazole -cisapride -dextromethorphan; quinidine -dofetilide -dronedarone -pimozide -quinidine -thioridazine -ziprasidone This medicine may also interact with the following medications: -antihistamines for allergy, cough and cold -atropine -bethanechol -carbamazepine -certain medicines for bladder problems like oxybutynin, tolterodine -certain medicines for Parkinson's disease like benztropine, trihexyphenidyl -certain medicines for stomach problems like dicyclomine, hyoscyamine -certain medicines for travel sickness like scopolamine -dexamethasone -ipratropium -NSAIDs, medicines for pain and inflammation, like ibuprofen or naproxen -other medicines for Alzheimer's disease -other medicines that prolong the QT interval (cause an abnormal heart rhythm) -phenobarbital -phenytoin -rifampin, rifabutin or rifapentine This list may not describe all possible interactions. Give your health care provider a list of all the medicines, herbs, non-prescription drugs, or dietary supplements you use. Also tell them if you smoke, drink alcohol, or use illegal drugs. Some items may interact with your medicine. What should I watch for while using this medicine? Visit your doctor or health care professional for regular checks on your progress. Check with your doctor or health care professional if your symptoms do not get better or if they get worse. You may get drowsy or dizzy. Do not drive, use machinery, or do anything that needs mental alertness until you know how this drug affects you. What side effects may I notice from receiving this medicine? Side effects that you should report to your doctor or health care professional as soon as possible: -allergic reactions like skin rash, itching or hives, swelling of  the face, lips, or  tongue -feeling faint or lightheaded, falls -loss of bladder control -seizures -signs and symptoms of a dangerous change in heartbeat or heart rhythm like chest pain; dizziness; fast or irregular heartbeat; palpitations; feeling faint or lightheaded, falls; breathing problems -signs and symptoms of infection like fever or chills; cough; sore throat; pain or trouble passing urine -signs and symptoms of liver injury like dark yellow or brown urine; general ill feeling or flu-like symptoms; light-colored stools; loss of appetite; nausea; right upper belly pain; unusually weak or tired; yellowing of the eyes or skin -slow heartbeat or palpitations -unusual bleeding or bruising -vomiting Side effects that usually do not require medical attention (report to your doctor or health care professional if they continue or are bothersome): -diarrhea, especially when starting treatment -headache -loss of appetite -muscle cramps -nausea -stomach upset This list may not describe all possible side effects. Call your doctor for medical advice about side effects. You may report side effects to FDA at 1-800-FDA-1088. Where should I keep my medicine? Keep out of reach of children. Store at room temperature between 15 and 30 degrees C (59 and 86 degrees F). Throw away any unused medicine after the expiration date. NOTE: This sheet is a summary. It may not cover all possible information. If you have questions about this medicine, talk to your doctor, pharmacist, or health care provider.  2018 Elsevier/Gold Standard (2016-05-10 21:00:42)

## 2017-04-20 ENCOUNTER — Encounter: Payer: Self-pay | Admitting: Internal Medicine

## 2017-04-20 ENCOUNTER — Other Ambulatory Visit (INDEPENDENT_AMBULATORY_CARE_PROVIDER_SITE_OTHER): Payer: Medicare Other

## 2017-04-20 ENCOUNTER — Ambulatory Visit (INDEPENDENT_AMBULATORY_CARE_PROVIDER_SITE_OTHER): Payer: Medicare Other | Admitting: Internal Medicine

## 2017-04-20 VITALS — BP 118/76 | HR 84 | Temp 97.8°F | Resp 16 | Wt 125.0 lb

## 2017-04-20 DIAGNOSIS — E039 Hypothyroidism, unspecified: Secondary | ICD-10-CM | POA: Diagnosis not present

## 2017-04-20 DIAGNOSIS — I10 Essential (primary) hypertension: Secondary | ICD-10-CM

## 2017-04-20 DIAGNOSIS — K219 Gastro-esophageal reflux disease without esophagitis: Secondary | ICD-10-CM

## 2017-04-20 DIAGNOSIS — E1165 Type 2 diabetes mellitus with hyperglycemia: Secondary | ICD-10-CM

## 2017-04-20 DIAGNOSIS — R0781 Pleurodynia: Secondary | ICD-10-CM

## 2017-04-20 LAB — COMPREHENSIVE METABOLIC PANEL
ALBUMIN: 4.1 g/dL (ref 3.5–5.2)
ALK PHOS: 99 U/L (ref 39–117)
ALT: 10 U/L (ref 0–35)
AST: 13 U/L (ref 0–37)
BUN: 29 mg/dL — AB (ref 6–23)
CO2: 29 mEq/L (ref 19–32)
CREATININE: 1.37 mg/dL — AB (ref 0.40–1.20)
Calcium: 9.2 mg/dL (ref 8.4–10.5)
Chloride: 96 mEq/L (ref 96–112)
GFR: 38.9 mL/min — ABNORMAL LOW (ref >60.00)
Glucose, Bld: 327 mg/dL — ABNORMAL HIGH (ref 70–99)
Potassium: 4 mEq/L (ref 3.5–5.1)
SODIUM: 132 meq/L — AB (ref 135–145)
TOTAL PROTEIN: 7.1 g/dL (ref 6.0–8.3)
Total Bilirubin: 0.6 mg/dL (ref 0.2–1.2)

## 2017-04-20 LAB — HEMOGLOBIN A1C: HEMOGLOBIN A1C: 9.2 % — AB (ref 4.6–6.5)

## 2017-04-20 LAB — TSH: TSH: 0.77 u[IU]/mL (ref 0.35–4.50)

## 2017-04-20 NOTE — Assessment & Plan Note (Signed)
Check a1c Will adjust medications if needed Encouraged increased walking Low sugar/carb diet

## 2017-04-20 NOTE — Assessment & Plan Note (Signed)
Check tsh  Titrate med dose if needed  

## 2017-04-20 NOTE — Assessment & Plan Note (Signed)
Likely related to fall Mild No further imaging needed Taking tramadol as needed

## 2017-04-20 NOTE — Progress Notes (Signed)
Subjective:    Patient ID: Janet Steele, female    DOB: 08-Jul-1929, 83 y.o.   MRN: [id number]  HPI The patient is here for follow up.  Memory disturbance:  She is following with neurology.  She was started on aricept.    Diabetes: She is taking her medication daily as prescribed. She is compliant with a diabetic diet. She is not exercising regularly. She monitors her sugars and they have been running 130's.   Hypothyroidism:  She is taking her medication daily.  She denies any recent changes in energy or weight that are unexplained.   GERD:  She is taking her medication daily as prescribed.  She denies any GERD symptoms and feels her GERD is well controlled.   She is still having left rib pain from her fall.  It is not that bad.  She carries a pillow with her and squeezing it when she moves helps.  She denies cough, wheeze, sob and fevers.    Medications and allergies reviewed with patient and updated if appropriate.  Patient Active Problem List   Diagnosis Date Noted  . PAD (peripheral artery disease) ([place]) 01/17/2017  . Overactive bladder 12/17/2016  . Anemia 05/20/2016  . Coronary artery disease involving coronary bypass graft of native heart without angina pectoris 05/19/2016  . Numbness and tingling of foot 12/31/2015  . MCI (mild cognitive impairment) with memory loss 11/18/2015  . Facial tic 05/07/2015  . Facial nerve spasm 05/07/2015  . Sick sinus syndrome ([place]) 02/26/2015  . Cervical dystonia 12/04/2014  . Chronic motor tic 09/12/2014  . Nonspecific abnormal electrocardiogram (ECG) (EKG)   . Unstable angina ([place]) 03/02/2014  . Pacemaker-Medtronic 07/07/2012  . Bradycardia 07/05/2012  . GERD (gastroesophageal reflux disease) 07/05/2012  . Near syncope 12/23/2011  . Abnormal involuntary movements(781.0) 12/17/2009  . Disturbance in sleep behavior 12/11/2009  . CAD, ARTERY BYPASS GRAFT 09/24/2009  . CAROTID BRUIT 09/24/2009  . Diabetes ([place]) 01/30/2009  .  DYSPHAGIA 06/19/2008  . Anxiety 02/14/2008  . EROSIVE ESOPHAGITIS 02/14/2008  . CONSTIPATION, CHRONIC 02/14/2008  . DEGENERATIVE JOINT DISEASE 02/14/2008  . HEARING LOSS 01/04/2008  . PVD 01/04/2008  . Hypothyroidism 09/25/2007  . HYPERLIPIDEMIA 09/25/2007  . Essential hypertension 09/07/2007  . HYPERTRIGLYCERIDEMIA 03/16/2007  . Diabetic polyneuropathy ([place]) 03/16/2007  . Stricture and stenosis of esophagus 03/16/2007  . HIATAL HERNIA 02/16/2007  . HEMORRHOIDS 12/31/1997  . DIVERTICULOSIS, COLON 12/31/1997    Current Outpatient Prescriptions on File Prior to Visit  Medication Sig Dispense Refill  . acetaminophen (TYLENOL) 500 MG tablet Take 2 tablets (1,000 mg total) by mouth every 6 (six) hours as needed. 30 tablet 0  . amLODipine (NORVASC) 5 MG tablet Take 1 tablet (5 mg total) by mouth daily. 30 tablet 6  . aspirin 81 MG EC tablet Take 1 tablet (81 mg total) by mouth daily. 30 tablet 11  . clopidogrel (PLAVIX) 75 MG tablet Take 1 tablet (75 mg total) by mouth daily. 30 tablet 6  . DULoxetine (CYMBALTA) 60 MG capsule take 1 capsule by mouth once daily 90 capsule 1  . gabapentin (NEURONTIN) 100 MG capsule take 1 capsule by mouth at bedtime (CAN INCREASE TO 2 PILLS AT BEDTIME AFTER A FEW DAYS) 180 capsule 1  . glimepiride (AMARYL) 2 MG tablet Take 2 mg by mouth daily before breakfast.     . levothyroxine (SYNTHROID, LEVOTHROID) 150 MCG tablet Take 1 tablet (150 mcg total) by mouth daily. 90 tablet 1  . losartan (COZAAR) 50  MG tablet Take 1 tablet (50 mg total) by mouth daily. 30 tablet 5  . metFORMIN (GLUCOPHAGE) 1000 MG tablet Take 1 tablet (1,000 mg total) by mouth 2 (two) times daily with a meal. 180 tablet 1  . metoprolol tartrate (LOPRESSOR) 25 MG tablet Take 1 tablet (25 mg total) by mouth 2 (two) times daily. 60 tablet 6  . mupirocin ointment (BACTROBAN) 2 %     . nitroGLYCERIN (NITROSTAT) 0.4 MG SL tablet Place 1 tablet (0.4 mg total) under the tongue every 5 (five) minutes  as needed. For chest pain 25 tablet 5  . pantoprazole (PROTONIX) 40 MG tablet Take 1 tablet (40 mg total) by mouth 2 (two) times daily. 60 tablet 6  . rosuvastatin (CRESTOR) 40 MG tablet Take 1 tablet (40 mg total) by mouth daily. 90 tablet 1  . sitaGLIPtin (JANUVIA) 50 MG tablet Take 1 tablet (50 mg total) by mouth daily. 90 tablet 0  . traMADol (ULTRAM) 50 MG tablet Take 1 tablet (50 mg total) by mouth every 6 (six) hours as needed for moderate pain (back pain). 60 tablet 0  . traZODone (DESYREL) 50 MG tablet Take 1.5 tablets (75 mg total) by mouth at bedtime as needed for sleep. 45 tablet 11   No current facility-administered medications on file prior to visit.     Past Medical History:  Diagnosis Date  . Anemia   . Anxiety   . Arthritis    "fingers" (05/19/2016  . CAD (coronary artery disease)    a. s/p CABG 1982 b. redo CABG 2003. c. [place] despite med rx 05/2016: successful but complicated PCI of PDA beyond graft site, c/b localized dissection requiring overlapping stent.  . Carotid bruit    a. Duplex 01/2015: patent vessels, 1-39% BICA, f/u PRN recommened.  . Colon polyp    adenomatous  . Colon, diverticulosis   . Degenerative joint disease   . Diastolic dysfunction   . Esophageal stricture   . GERD (gastroesophageal reflux disease)   . Hiatal hernia   . History of blood transfusion    "related to my bypass"  . Hyperlipidemia   . Hypertension   . Hypothyroidism   . Ischemic cardiomyopathy    a. Cath 05/2016: EF 40% with severe inferior wall HK, suspect hibernating myocardium.  . Myocardial infarction ([place]) 1977; 1982  . Osteoarthrosis, unspecified whether generalized or localized, unspecified site   . Presence of permanent cardiac pacemaker   . PVD (peripheral vascular disease) ([place])   . Type II diabetes mellitus ([place])   . Unspecified hearing loss     Past Surgical History:  Procedure Laterality Date  . ABDOMINAL HYSTERECTOMY    . APPENDECTOMY    . BACK SURGERY    .  CARDIAC CATHETERIZATION N/A 05/19/2016   Procedure: Left Heart Cath and Cors/Grafts Angiography;  Surgeon: Belva Crome, MD;  Location: [facility];  Service: Cardiovascular;  Laterality: N/A;  . CARDIAC CATHETERIZATION N/A 05/19/2016   Procedure: Coronary Stent Intervention;  Surgeon: Belva Crome, MD;  Location: [facility];  Service: Cardiovascular;  Laterality: N/A;  . CATARACT EXTRACTION W/ INTRAOCULAR LENS  IMPLANT, BILATERAL Bilateral   . CHOLECYSTECTOMY OPEN    . COLONOSCOPY    . CORONARY ANGIOPLASTY WITH STENT PLACEMENT  05/19/2016   "2 stents?"  . CORONARY ARTERY BYPASS GRAFT  1982; 2003   x4(1982),02-2002 CABG X2  . ESOPHAGOGASTRODUODENOSCOPY (EGD) WITH ESOPHAGEAL DILATION  "2-3 times"  . INSERT / REPLACE / REMOVE PACEMAKER    .  LEFT HEART CATHETERIZATION WITH CORONARY/GRAFT ANGIOGRAM N/A 03/05/2014   Procedure: LEFT HEART CATHETERIZATION WITH Beatrix Fetters;  Surgeon: Sinclair Grooms, MD;  Location: [facility];  Service: Cardiovascular;  Laterality: N/A;  . LUMBAR [place] SURGERY  01-2000  . OOPHORECTOMY Bilateral   . PERMANENT PACEMAKER INSERTION N/A 07/06/2012   MDT Adapta L implanted by Dr Rayann Heman for symptomatic bradycardia  . TONSILLECTOMY  1930s    Social History   Social History  . Marital status: Married    Spouse name: [name]  . Number of children: 3  . Years of education: 16   Occupational History  . Retired    .  Retired   Social History Main Topics  . Smoking status: Former Smoker    Packs/day: 0.50    Years: 30.00    Types: Cigarettes    Quit date: 07/05/1976  . Smokeless tobacco: Never Used  . Alcohol use 1.8 oz/week    3 Glasses of wine per week  . Drug use: No  . Sexual activity: No   Other Topics Concern  . Not on file   Social History Narrative   Patient is married [name]) and lives at home with her husband.   Patient has three adult children.   Patient is retired.   Patient has a college education.   Patient is  right-handed.   Patient does not drink any caffeine.    Family History  Problem Relation Age of Onset  . Heart disease Sister   . Heart attack Sister   . Colon cancer Neg Hx   . Breast cancer Neg Hx   . Celiac disease Neg Hx   . Cirrhosis Neg Hx   . Clotting disorder Neg Hx   . Colitis Neg Hx   . Colon polyps Neg Hx   . Crohn's disease Neg Hx   . Cystic fibrosis Neg Hx   . Diabetes Neg Hx   . Esophageal cancer Neg Hx   . Hemochromatosis Neg Hx   . Inflammatory bowel disease Neg Hx   . Irritable bowel syndrome Neg Hx   . Kidney disease Neg Hx   . Liver cancer Neg Hx   . Liver disease Neg Hx   . Ovarian cancer Neg Hx   . Pancreatic cancer Neg Hx   . Prostate cancer Neg Hx   . Rectal cancer Neg Hx   . Stomach cancer Neg Hx   . Ulcerative colitis Neg Hx   . Uterine cancer Neg Hx   . Wilson's disease Neg Hx     Review of Systems  Constitutional: Negative for chills and fever.  Respiratory: Negative for cough, shortness of breath and wheezing.   Cardiovascular: Negative for chest pain, palpitations and leg swelling.  Gastrointestinal: Negative for abdominal pain.  Neurological: Negative for light-headedness and headaches.       Objective:   Vitals:   04/20/17 1335  BP: 118/76  Pulse: 84  Resp: 16  Temp: 97.8 F (36.6 C)   Wt Readings from Last 3 Encounters:  04/20/17 125 lb (56.7 kg)  04/05/17 125 lb 12.8 oz (57.1 kg)  03/26/17 124 lb 12 oz (56.6 kg)   Body mass index is 24.41 kg/m.   Physical Exam    Constitutional: Appears well-developed and well-nourished. No distress.  HENT:  Head: Normocephalic and atraumatic.  Neck: Neck supple. No tracheal deviation present. No thyromegaly present.  No cervical lymphadenopathy Cardiovascular: Normal rate, regular rhythm and normal heart sounds.   No murmur  heard. No carotid bruit .  No edema Pulmonary/Chest: Effort normal and breath sounds normal. No respiratory distress. No has no wheezes. No rales.  left sided  rib pain Skin: Skin is warm and dry. Not diaphoretic.  Psychiatric: Normal mood and affect. Behavior is normal.      Assessment & Plan:    See Problem List for Assessment and Plan of chronic medical problems.

## 2017-04-20 NOTE — Assessment & Plan Note (Signed)
BP well controlled Current regimen effective and well tolerated Continue current medications at current doses  

## 2017-04-20 NOTE — Assessment & Plan Note (Signed)
GERD controlled Continue daily medication  

## 2017-04-20 NOTE — Patient Instructions (Addendum)
  Test(s) ordered today. Your results will be released to [facility] (or called to you) after review, usually within 72hours after test completion. If any changes need to be made, you will be notified at that same time.   Medications reviewed and updated.  No changes recommended at this time.   Please followup in 3 motnhs

## 2017-04-25 ENCOUNTER — Other Ambulatory Visit: Payer: Self-pay | Admitting: Internal Medicine

## 2017-04-25 ENCOUNTER — Other Ambulatory Visit: Payer: Self-pay | Admitting: Nurse Practitioner

## 2017-04-25 DIAGNOSIS — W19XXXD Unspecified fall, subsequent encounter: Secondary | ICD-10-CM

## 2017-04-25 DIAGNOSIS — S2242XA Multiple fractures of ribs, left side, initial encounter for closed fracture: Secondary | ICD-10-CM

## 2017-04-25 DIAGNOSIS — E1165 Type 2 diabetes mellitus with hyperglycemia: Secondary | ICD-10-CM

## 2017-04-25 NOTE — Telephone Encounter (Signed)
Please advise last fill 03/30/17

## 2017-04-25 NOTE — Telephone Encounter (Signed)
Ok to refill.   rx printed

## 2017-04-26 NOTE — Telephone Encounter (Signed)
RX faxed to [facility] 

## 2017-05-26 ENCOUNTER — Other Ambulatory Visit: Payer: Self-pay | Admitting: Nurse Practitioner

## 2017-05-26 DIAGNOSIS — E039 Hypothyroidism, unspecified: Secondary | ICD-10-CM | POA: Diagnosis not present

## 2017-05-26 DIAGNOSIS — W19XXXD Unspecified fall, subsequent encounter: Secondary | ICD-10-CM

## 2017-05-26 DIAGNOSIS — G609 Hereditary and idiopathic neuropathy, unspecified: Secondary | ICD-10-CM | POA: Diagnosis not present

## 2017-05-26 DIAGNOSIS — E1165 Type 2 diabetes mellitus with hyperglycemia: Secondary | ICD-10-CM | POA: Diagnosis not present

## 2017-05-26 DIAGNOSIS — S2242XA Multiple fractures of ribs, left side, initial encounter for closed fracture: Secondary | ICD-10-CM

## 2017-05-26 DIAGNOSIS — E7800 Pure hypercholesterolemia, unspecified: Secondary | ICD-10-CM | POA: Diagnosis not present

## 2017-05-26 NOTE — Telephone Encounter (Signed)
Call her and see exactly what she takes the tramadol for? I believe Dr Trenton Gammon was prescribing it for her before we started prescribing it.     [place] controlled substance database checked.  She is due for a refill.

## 2017-05-27 ENCOUNTER — Other Ambulatory Visit: Payer: Self-pay | Admitting: Internal Medicine

## 2017-05-27 DIAGNOSIS — W19XXXD Unspecified fall, subsequent encounter: Secondary | ICD-10-CM

## 2017-05-27 DIAGNOSIS — S2242XA Multiple fractures of ribs, left side, initial encounter for closed fracture: Secondary | ICD-10-CM

## 2017-06-01 ENCOUNTER — Other Ambulatory Visit: Payer: Self-pay | Admitting: Internal Medicine

## 2017-06-01 NOTE — Telephone Encounter (Signed)
Pls advise if ok to refill../lmb 

## 2017-06-09 NOTE — Progress Notes (Signed)
Subjective:    Patient ID: Janet Steele, female    DOB: 10-Jun-1929, 83 y.o.   MRN: [id number]  HPI She is here for an acute visit.   She has had a problem with insomnia forever, but it is worse recently.   Insomnia:  She has not slept for the past three nights.  She takes trazodone nightly, but it does not work.  She goes to bed around 10.  She just lays there.  She will get up and get a glass of skim milk and it helps.  She fell asleep at 7 am this morning and slept until 8 am.  She feels exhausted throughout the day.  She has taken two of the trazodone before and it does not help.    She denies drinking any alcohol at night.  She does not drink coffee.  She drinks ice tea during the day - 1-2 at most.  She does not drink it at night.    She does nap during the day but not daily.    She does have some memory issues and is alone today.  I do not know how reliable her history is.   Medications and allergies reviewed with patient and updated if appropriate.  Patient Active Problem List   Diagnosis Date Noted  . Rib pain on left side 04/20/2017  . PAD (peripheral artery disease) ([place]) 01/17/2017  . Overactive bladder 12/17/2016  . Anemia 05/20/2016  . Coronary artery disease involving coronary bypass graft of native heart without angina pectoris 05/19/2016  . Numbness and tingling of foot 12/31/2015  . MCI (mild cognitive impairment) with memory loss 11/18/2015  . Facial tic 05/07/2015  . Facial nerve spasm 05/07/2015  . Sick sinus syndrome ([place]) 02/26/2015  . Cervical dystonia 12/04/2014  . Chronic motor tic 09/12/2014  . Nonspecific abnormal electrocardiogram (ECG) (EKG)   . Unstable angina ([place]) 03/02/2014  . Pacemaker-Medtronic 07/07/2012  . Bradycardia 07/05/2012  . GERD (gastroesophageal reflux disease) 07/05/2012  . Near syncope 12/23/2011  . Abnormal involuntary movements(781.0) 12/17/2009  . Disturbance in sleep behavior 12/11/2009  . CAD, ARTERY BYPASS GRAFT  09/24/2009  . CAROTID BRUIT 09/24/2009  . Diabetes ([place]) 01/30/2009  . DYSPHAGIA 06/19/2008  . Anxiety 02/14/2008  . EROSIVE ESOPHAGITIS 02/14/2008  . CONSTIPATION, CHRONIC 02/14/2008  . DEGENERATIVE JOINT DISEASE 02/14/2008  . HEARING LOSS 01/04/2008  . PVD 01/04/2008  . Hypothyroidism 09/25/2007  . HYPERLIPIDEMIA 09/25/2007  . Essential hypertension 09/07/2007  . HYPERTRIGLYCERIDEMIA 03/16/2007  . Diabetic polyneuropathy ([place]) 03/16/2007  . Stricture and stenosis of esophagus 03/16/2007  . HIATAL HERNIA 02/16/2007  . HEMORRHOIDS 12/31/1997  . DIVERTICULOSIS, COLON 12/31/1997    Current Outpatient Prescriptions on File Prior to Visit  Medication Sig Dispense Refill  . acetaminophen (TYLENOL) 500 MG tablet Take 2 tablets (1,000 mg total) by mouth every 6 (six) hours as needed. 30 tablet 0  . amLODipine (NORVASC) 5 MG tablet Take 1 tablet (5 mg total) by mouth daily. 30 tablet 6  . aspirin 81 MG EC tablet Take 1 tablet (81 mg total) by mouth daily. 30 tablet 11  . clopidogrel (PLAVIX) 75 MG tablet Take 1 tablet (75 mg total) by mouth daily. 30 tablet 6  . DULoxetine (CYMBALTA) 60 MG capsule take 1 capsule by mouth once daily 90 capsule 1  . glimepiride (AMARYL) 2 MG tablet Take 2 mg by mouth daily before breakfast.     . levothyroxine (SYNTHROID, LEVOTHROID) 150 MCG tablet Take 1 tablet (150  mcg total) by mouth daily. 90 tablet 1  . losartan (COZAAR) 50 MG tablet Take 1 tablet (50 mg total) by mouth daily. 30 tablet 5  . metFORMIN (GLUCOPHAGE) 1000 MG tablet Take 1 tablet (1,000 mg total) by mouth 2 (two) times daily with a meal. 180 tablet 1  . metoprolol tartrate (LOPRESSOR) 25 MG tablet Take 1 tablet (25 mg total) by mouth 2 (two) times daily. 60 tablet 6  . mupirocin ointment (BACTROBAN) 2 %     . nitroGLYCERIN (NITROSTAT) 0.4 MG SL tablet Place 1 tablet (0.4 mg total) under the tongue every 5 (five) minutes as needed. For chest pain 25 tablet 5  . pantoprazole (PROTONIX) 40 MG  tablet take 1 tablet by mouth twice a day 60 tablet 3  . rosuvastatin (CRESTOR) 40 MG tablet Take 1 tablet (40 mg total) by mouth daily. 90 tablet 1  . sitaGLIPtin (JANUVIA) 50 MG tablet Take 1 tablet (50 mg total) by mouth daily. 90 tablet 0  . traMADol (ULTRAM) 50 MG tablet take 1 tablet by mouth every 6 hours if needed for MODERATE BACK PAIN 60 tablet 0   No current facility-administered medications on file prior to visit.     Past Medical History:  Diagnosis Date  . Anemia   . Anxiety   . Arthritis    "fingers" (05/19/2016  . CAD (coronary artery disease)    a. s/p CABG 1982 b. redo CABG 2003. c. [place] despite med rx 05/2016: successful but complicated PCI of PDA beyond graft site, c/b localized dissection requiring overlapping stent.  . Carotid bruit    a. Duplex 01/2015: patent vessels, 1-39% BICA, f/u PRN recommened.  . Colon polyp    adenomatous  . Colon, diverticulosis   . Degenerative joint disease   . Diastolic dysfunction   . Esophageal stricture   . GERD (gastroesophageal reflux disease)   . Hiatal hernia   . History of blood transfusion    "related to my bypass"  . Hyperlipidemia   . Hypertension   . Hypothyroidism   . Ischemic cardiomyopathy    a. Cath 05/2016: EF 40% with severe inferior wall HK, suspect hibernating myocardium.  . Myocardial infarction ([place]) 1977; 1982  . Osteoarthrosis, unspecified whether generalized or localized, unspecified site   . Presence of permanent cardiac pacemaker   . PVD (peripheral vascular disease) ([employer])   . Type II diabetes mellitus ([place])   . Unspecified hearing loss     Past Surgical History:  Procedure Laterality Date  . ABDOMINAL HYSTERECTOMY    . APPENDECTOMY    . BACK SURGERY    . CARDIAC CATHETERIZATION N/A 05/19/2016   Procedure: Left Heart Cath and Cors/Grafts Angiography;  Surgeon: Belva Crome, MD;  Location: [facility];  Service: Cardiovascular;  Laterality: N/A;  . CARDIAC CATHETERIZATION N/A 05/19/2016    Procedure: Coronary Stent Intervention;  Surgeon: Belva Crome, MD;  Location: [facility];  Service: Cardiovascular;  Laterality: N/A;  . CATARACT EXTRACTION W/ INTRAOCULAR LENS  IMPLANT, BILATERAL Bilateral   . CHOLECYSTECTOMY OPEN    . COLONOSCOPY    . CORONARY ANGIOPLASTY WITH STENT PLACEMENT  05/19/2016   "2 stents?"  . CORONARY ARTERY BYPASS GRAFT  1982; 2003   x4(1982),02-2002 CABG X2  . ESOPHAGOGASTRODUODENOSCOPY (EGD) WITH ESOPHAGEAL DILATION  "2-3 times"  . INSERT / REPLACE / REMOVE PACEMAKER    . LEFT HEART CATHETERIZATION WITH CORONARY/GRAFT ANGIOGRAM N/A 03/05/2014   Procedure: LEFT HEART CATHETERIZATION WITH CORONARY/GRAFT  Cyril Loosen;  Surgeon: Sinclair Grooms, MD;  Location: [facility] LAB;  Service: Cardiovascular;  Laterality: N/A;  . LUMBAR [place] SURGERY  01-2000  . OOPHORECTOMY Bilateral   . PERMANENT PACEMAKER INSERTION N/A 07/06/2012   MDT Adapta L implanted by Dr Rayann Heman for symptomatic bradycardia  . TONSILLECTOMY  1930s    Social History   Social History  . Marital status: Married    Spouse name: [name]  . Number of children: 3  . Years of education: 16   Occupational History  . Retired    .  Retired   Social History Main Topics  . Smoking status: Former Smoker    Packs/day: 0.50    Years: 30.00    Types: Cigarettes    Quit date: 07/05/1976  . Smokeless tobacco: Never Used  . Alcohol use 1.8 oz/week    3 Glasses of wine per week  . Drug use: No  . Sexual activity: No   Other Topics Concern  . None   Social History Narrative   Patient is married [name]) and lives at home with her husband.   Patient has three adult children.   Patient is retired.   Patient has a college education.   Patient is right-handed.   Patient does not drink any caffeine.    Family History  Problem Relation Age of Onset  . Heart disease Sister   . Heart attack Sister   . Colon cancer Neg Hx   . Breast cancer Neg Hx   . Celiac disease Neg Hx   . Cirrhosis Neg  Hx   . Clotting disorder Neg Hx   . Colitis Neg Hx   . Colon polyps Neg Hx   . Crohn's disease Neg Hx   . Cystic fibrosis Neg Hx   . Diabetes Neg Hx   . Esophageal cancer Neg Hx   . Hemochromatosis Neg Hx   . Inflammatory bowel disease Neg Hx   . Irritable bowel syndrome Neg Hx   . Kidney disease Neg Hx   . Liver cancer Neg Hx   . Liver disease Neg Hx   . Ovarian cancer Neg Hx   . Pancreatic cancer Neg Hx   . Prostate cancer Neg Hx   . Rectal cancer Neg Hx   . Stomach cancer Neg Hx   . Ulcerative colitis Neg Hx   . Uterine cancer Neg Hx   . Wilson's disease Neg Hx     Review of Systems  Constitutional: Negative for appetite change, chills and fever.  Respiratory: Negative for shortness of breath.   Cardiovascular: Negative for chest pain, palpitations and leg swelling.  Neurological: Negative for light-headedness and headaches.  Psychiatric/Behavioral: Positive for sleep disturbance. Negative for dysphoric mood. The patient is not nervous/anxious.        Objective:   Vitals:   06/10/17 1448  BP: 120/60  Pulse: 83  Temp: 97.7 F (36.5 C)   Filed Weights   06/10/17 1448  Weight: 125 lb 12 oz (57 kg)   Body mass index is 24.56 kg/m.  Wt Readings from Last 3 Encounters:  06/10/17 125 lb 12 oz (57 kg)  04/20/17 125 lb (56.7 kg)  04/05/17 125 lb 12.8 oz (57.1 kg)     Physical Exam  Constitutional: She appears well-developed and well-nourished. No distress.  Skin: Skin is warm and dry. She is not diaphoretic.  Psychiatric: She has a normal mood and affect. Her behavior is normal. Judgment and thought content normal.  Assessment & Plan:   See Problem List for Assessment and Plan of chronic medical problems.

## 2017-06-10 ENCOUNTER — Encounter: Payer: Self-pay | Admitting: Internal Medicine

## 2017-06-10 ENCOUNTER — Ambulatory Visit (INDEPENDENT_AMBULATORY_CARE_PROVIDER_SITE_OTHER): Payer: Medicare Other | Admitting: Internal Medicine

## 2017-06-10 VITALS — BP 120/60 | HR 83 | Temp 97.7°F | Ht 60.0 in | Wt 125.8 lb

## 2017-06-10 DIAGNOSIS — G479 Sleep disorder, unspecified: Secondary | ICD-10-CM

## 2017-06-10 MED ORDER — GABAPENTIN 300 MG PO CAPS: 300.0000 mg | ORAL_CAPSULE | Freq: Every day | ORAL | 3 refills | Status: DC

## 2017-06-10 MED ORDER — TRAZODONE HCL 50 MG PO TABS: 100.0000 mg | ORAL_TABLET | Freq: Every day | ORAL | 11 refills | Status: DC

## 2017-06-10 NOTE — Patient Instructions (Addendum)
Increase gabapentin to 300 mg nightly.   Continue trazodone 100 mg ( two pills) at night.    Your prescription(s) have been submitted to your pharmacy. Please take as directed and contact our office if you believe you are having problem(s) with the medication(s).   Call Dr Dohmeier to discuss your increase in head movements.

## 2017-06-11 NOTE — Assessment & Plan Note (Signed)
She has been taking trazodone 75-100 mg at night and does not seem to help.  We will try increasing it to 100 mg every night Increase gabapentin to 300 mg at night I advised her there are many medications for sleep that are not safe for her due to increasing her risk of falls or affecting her memory Discussed sleep hygiene Advised avoiding naps during the day

## 2017-06-14 ENCOUNTER — Telehealth: Payer: Self-pay | Admitting: Internal Medicine

## 2017-06-14 DIAGNOSIS — H9193 Unspecified hearing loss, bilateral: Secondary | ICD-10-CM

## 2017-06-14 NOTE — Telephone Encounter (Signed)
Ordered - has appt tomorrow

## 2017-06-14 NOTE — Telephone Encounter (Signed)
Pt would like a referral to Dr. Karma Greaser in [place] for a hearing test  Fax [phone] She already has an appt on next Wednesday

## 2017-06-14 NOTE — Telephone Encounter (Signed)
Referral has been faxed.

## 2017-06-17 ENCOUNTER — Other Ambulatory Visit: Payer: Self-pay | Admitting: Physician Assistant

## 2017-06-22 DIAGNOSIS — H9311 Tinnitus, right ear: Secondary | ICD-10-CM | POA: Diagnosis not present

## 2017-06-22 DIAGNOSIS — H903 Sensorineural hearing loss, bilateral: Secondary | ICD-10-CM | POA: Diagnosis not present

## 2017-07-01 ENCOUNTER — Other Ambulatory Visit: Payer: Self-pay | Admitting: Internal Medicine

## 2017-07-01 DIAGNOSIS — S2242XA Multiple fractures of ribs, left side, initial encounter for closed fracture: Secondary | ICD-10-CM

## 2017-07-01 DIAGNOSIS — W19XXXD Unspecified fall, subsequent encounter: Secondary | ICD-10-CM

## 2017-07-02 NOTE — Progress Notes (Signed)
Subjective:    Patient ID: Janet Steele, female    DOB: 1929-06-29, 83 y.o.   MRN: [id number]  HPI The patient is here for follow up.  Last week she got sick and stopped her medications for 4 days.  She had diarrhea and vomited.  No one else was sick.  She has this on occasion and is unsure why.    Hypertension: She is taking her medication daily. She is compliant with a low sodium diet.  She denies chest pain, palpitations, edema, shortness of breath and regular headaches. She is not exercising regularly.  She does monitor her blood pressure at home but not regularly.    Hyperlipidemia: She is taking her medication daily. She is compliant with a low fat/cholesterol diet. She is not exercising regularly. She denies myalgias.   Diabetes: She is taking her medication daily as prescribed. She is compliant with a diabetic diet. She is not exercising regularly. She does not monitor her sugars.   She checks her feet daily and denies foot lesions. She is up-to-date with an ophthalmology examination.   Hypothyroidism:  She is taking her medication daily.  She denies any recent changes in energy or weight that are unexplained.   Anxiety: She is taking her medication daily as prescribed. She denies any side effects from the medication. She feels her anxiety is well controlled and she is happy with her current dose of medication.   GERD:  She is taking her medication daily as prescribed.  She denies any GERD symptoms and feels her GERD is well controlled.   Insomnia:  She takes trazodone nightly and it works well. She is happy with her current dose.  Back pain:  She takes tramadol daily.  She feels her pain is well controlled.   Medications and allergies reviewed with patient and updated if appropriate.  Patient Active Problem List   Diagnosis Date Noted  . Rib pain on left side 04/20/2017  . PAD (peripheral artery disease) ([place]) 01/17/2017  . Overactive bladder 12/17/2016  . Anemia  05/20/2016  . Coronary artery disease involving coronary bypass graft of native heart without angina pectoris 05/19/2016  . Numbness and tingling of foot 12/31/2015  . MCI (mild cognitive impairment) with memory loss 11/18/2015  . Facial tic 05/07/2015  . Facial nerve spasm 05/07/2015  . Sick sinus syndrome ([place]) 02/26/2015  . Cervical dystonia 12/04/2014  . Chronic motor tic 09/12/2014  . Nonspecific abnormal electrocardiogram (ECG) (EKG)   . Unstable angina ([place]) 03/02/2014  . Pacemaker-Medtronic 07/07/2012  . Bradycardia 07/05/2012  . GERD (gastroesophageal reflux disease) 07/05/2012  . Near syncope 12/23/2011  . Abnormal involuntary movements(781.0) 12/17/2009  . Disturbance in sleep behavior 12/11/2009  . CAD, ARTERY BYPASS GRAFT 09/24/2009  . CAROTID BRUIT 09/24/2009  . Diabetes ([place]) 01/30/2009  . DYSPHAGIA 06/19/2008  . Anxiety 02/14/2008  . EROSIVE ESOPHAGITIS 02/14/2008  . CONSTIPATION, CHRONIC 02/14/2008  . DEGENERATIVE JOINT DISEASE 02/14/2008  . HEARING LOSS 01/04/2008  . PVD 01/04/2008  . Hypothyroidism 09/25/2007  . HYPERLIPIDEMIA 09/25/2007  . Essential hypertension 09/07/2007  . HYPERTRIGLYCERIDEMIA 03/16/2007  . Diabetic polyneuropathy ([place]) 03/16/2007  . Stricture and stenosis of esophagus 03/16/2007  . HIATAL HERNIA 02/16/2007  . HEMORRHOIDS 12/31/1997  . DIVERTICULOSIS, COLON 12/31/1997    Current Outpatient Prescriptions on File Prior to Visit  Medication Sig Dispense Refill  . acetaminophen (TYLENOL) 500 MG tablet Take 2 tablets (1,000 mg total) by mouth every 6 (six) hours as needed. 30 tablet 0  .  amLODipine (NORVASC) 5 MG tablet Take 1 tablet (5 mg total) by mouth daily. 30 tablet 6  . aspirin (RA ASPIRIN EC) 81 MG EC tablet Take 1 tablet (81 mg total) by mouth daily. Overdue for follow up. Please call and schedule [phone] 30 tablet 0  . clopidogrel (PLAVIX) 75 MG tablet Take 1 tablet (75 mg total) by mouth daily. 30 tablet 6  . DULoxetine  (CYMBALTA) 60 MG capsule take 1 capsule by mouth once daily 90 capsule 1  . gabapentin (NEURONTIN) 300 MG capsule Take 1 capsule (300 mg total) by mouth at bedtime. 90 capsule 3  . glimepiride (AMARYL) 2 MG tablet Take 2 mg by mouth daily before breakfast.     . levothyroxine (SYNTHROID, LEVOTHROID) 150 MCG tablet Take 1 tablet (150 mcg total) by mouth daily. 90 tablet 1  . losartan (COZAAR) 50 MG tablet Take 1 tablet (50 mg total) by mouth daily. 30 tablet 5  . metFORMIN (GLUCOPHAGE) 1000 MG tablet Take 1 tablet (1,000 mg total) by mouth 2 (two) times daily with a meal. 180 tablet 1  . metoprolol tartrate (LOPRESSOR) 25 MG tablet Take 1 tablet (25 mg total) by mouth 2 (two) times daily. 60 tablet 6  . mupirocin ointment (BACTROBAN) 2 %     . nitroGLYCERIN (NITROSTAT) 0.4 MG SL tablet Place 1 tablet (0.4 mg total) under the tongue every 5 (five) minutes as needed. For chest pain 25 tablet 5  . pantoprazole (PROTONIX) 40 MG tablet take 1 tablet by mouth twice a day 60 tablet 3  . rosuvastatin (CRESTOR) 40 MG tablet Take 1 tablet (40 mg total) by mouth daily. 90 tablet 1  . sitaGLIPtin (JANUVIA) 50 MG tablet Take 1 tablet (50 mg total) by mouth daily. 90 tablet 0  . traMADol (ULTRAM) 50 MG tablet take 1 tablet by mouth every 6 hours if needed for MODERATE BACK PAIN 60 tablet 0  . traZODone (DESYREL) 50 MG tablet Take 2 tablets (100 mg total) by mouth at bedtime. 60 tablet 11   No current facility-administered medications on file prior to visit.     Past Medical History:  Diagnosis Date  . Anemia   . Anxiety   . Arthritis    "fingers" (05/19/2016  . CAD (coronary artery disease)    a. s/p CABG 1982 b. redo CABG 2003. c. [place] despite med rx 05/2016: successful but complicated PCI of PDA beyond graft site, c/b localized dissection requiring overlapping stent.  . Carotid bruit    a. Duplex 01/2015: patent vessels, 1-39% BICA, f/u PRN recommened.  . Colon polyp    adenomatous  . Colon,  diverticulosis   . Degenerative joint disease   . Diastolic dysfunction   . Esophageal stricture   . GERD (gastroesophageal reflux disease)   . Hiatal hernia   . History of blood transfusion    "related to my bypass"  . Hyperlipidemia   . Hypertension   . Hypothyroidism   . Ischemic cardiomyopathy    a. Cath 05/2016: EF 40% with severe inferior wall HK, suspect hibernating myocardium.  . Myocardial infarction ([place]) 1977; 1982  . Osteoarthrosis, unspecified whether generalized or localized, unspecified site   . Presence of permanent cardiac pacemaker   . PVD (peripheral vascular disease) ([place])   . Type II diabetes mellitus ([place])   . Unspecified hearing loss     Past Surgical History:  Procedure Laterality Date  . ABDOMINAL HYSTERECTOMY    . APPENDECTOMY    . BACK  SURGERY    . CARDIAC CATHETERIZATION N/A 05/19/2016   Procedure: Left Heart Cath and Cors/Grafts Angiography;  Surgeon: Belva Crome, MD;  Location: [facility];  Service: Cardiovascular;  Laterality: N/A;  . CARDIAC CATHETERIZATION N/A 05/19/2016   Procedure: Coronary Stent Intervention;  Surgeon: Belva Crome, MD;  Location: [facility];  Service: Cardiovascular;  Laterality: N/A;  . CATARACT EXTRACTION W/ INTRAOCULAR LENS  IMPLANT, BILATERAL Bilateral   . CHOLECYSTECTOMY OPEN    . COLONOSCOPY    . CORONARY ANGIOPLASTY WITH STENT PLACEMENT  05/19/2016   "2 stents?"  . CORONARY ARTERY BYPASS GRAFT  1982; 2003   x4(1982),02-2002 CABG X2  . ESOPHAGOGASTRODUODENOSCOPY (EGD) WITH ESOPHAGEAL DILATION  "2-3 times"  . INSERT / REPLACE / REMOVE PACEMAKER    . LEFT HEART CATHETERIZATION WITH CORONARY/GRAFT ANGIOGRAM N/A 03/05/2014   Procedure: LEFT HEART CATHETERIZATION WITH Beatrix Fetters;  Surgeon: Sinclair Grooms, MD;  Location[facility];  Service: Cardiovascular;  Laterality: N/A;  . LUMBAR [place] SURGERY  01-2000  . OOPHORECTOMY Bilateral   . PERMANENT PACEMAKER INSERTION N/A 07/06/2012   MDT  Adapta L implanted by Dr Rayann Heman for symptomatic bradycardia  . TONSILLECTOMY  1930s    Social History   Social History  . Marital status: Married    Spouse name: [name]  . Number of children: 3  . Years of education: 16   Occupational History  . Retired    .  Retired   Social History Main Topics  . Smoking status: Former Smoker    Packs/day: 0.50    Years: 30.00    Types: Cigarettes    Quit date: 07/05/1976  . Smokeless tobacco: Never Used  . Alcohol use 1.8 oz/week    3 Glasses of wine per week  . Drug use: No  . Sexual activity: No   Other Topics Concern  . None   Social History Narrative   Patient is married [name]) and lives at home with her husband.   Patient has three adult children.   Patient is retired.   Patient has a college education.   Patient is right-handed.   Patient does not drink any caffeine.    Family History  Problem Relation Age of Onset  . Heart disease Sister   . Heart attack Sister   . Colon cancer Neg Hx   . Breast cancer Neg Hx   . Celiac disease Neg Hx   . Cirrhosis Neg Hx   . Clotting disorder Neg Hx   . Colitis Neg Hx   . Colon polyps Neg Hx   . Crohn's disease Neg Hx   . Cystic fibrosis Neg Hx   . Diabetes Neg Hx   . Esophageal cancer Neg Hx   . Hemochromatosis Neg Hx   . Inflammatory bowel disease Neg Hx   . Irritable bowel syndrome Neg Hx   . Kidney disease Neg Hx   . Liver cancer Neg Hx   . Liver disease Neg Hx   . Ovarian cancer Neg Hx   . Pancreatic cancer Neg Hx   . Prostate cancer Neg Hx   . Rectal cancer Neg Hx   . Stomach cancer Neg Hx   . Ulcerative colitis Neg Hx   . Uterine cancer Neg Hx   . Wilson's disease Neg Hx     Review of Systems  Constitutional: Negative for chills, fatigue (low energy ) and fever.  Respiratory: Positive for cough. Negative for shortness of breath  and wheezing.   Cardiovascular: Negative for chest pain, palpitations and leg swelling.  Gastrointestinal: Negative for abdominal  pain.       No gerd  Musculoskeletal: Positive for arthralgias.  Neurological: Negative for light-headedness and headaches.  Psychiatric/Behavioral: Negative for dysphoric mood and sleep disturbance. The patient is not nervous/anxious.        Objective:   Vitals:   07/04/17 1405  BP: 138/82  Pulse: 87  Resp: 16  Temp: 98.4 F (36.9 C)   Wt Readings from Last 3 Encounters:  07/04/17 125 lb (56.7 kg)  06/10/17 125 lb 12 oz (57 kg)  04/20/17 125 lb (56.7 kg)   Body mass index is 24.41 kg/m.   Physical Exam    Constitutional: Appears well-developed and well-nourished. No distress.  HENT:  Head: Normocephalic and atraumatic.  Neck: Neck supple. No tracheal deviation present. No thyromegaly present.  No cervical lymphadenopathy Cardiovascular: Normal rate, regular rhythm and normal heart sounds.   1/6 systolic murmur heard. No carotid bruit .  No edema Pulmonary/Chest: Effort normal and breath sounds normal. No respiratory distress. No has no wheezes. No rales.  Skin: Skin is warm and dry. Not diaphoretic.  Psychiatric: Normal mood and affect. Behavior is normal.      Assessment & Plan:    See Problem List for Assessment and Plan of chronic medical problems.

## 2017-07-04 ENCOUNTER — Ambulatory Visit (INDEPENDENT_AMBULATORY_CARE_PROVIDER_SITE_OTHER): Payer: Medicare Other | Admitting: Internal Medicine

## 2017-07-04 ENCOUNTER — Other Ambulatory Visit (INDEPENDENT_AMBULATORY_CARE_PROVIDER_SITE_OTHER): Payer: Medicare Other

## 2017-07-04 ENCOUNTER — Encounter: Payer: Self-pay | Admitting: Internal Medicine

## 2017-07-04 VITALS — BP 138/82 | HR 87 | Temp 98.4°F | Resp 16 | Wt 125.0 lb

## 2017-07-04 DIAGNOSIS — M545 Low back pain, unspecified: Secondary | ICD-10-CM

## 2017-07-04 DIAGNOSIS — K219 Gastro-esophageal reflux disease without esophagitis: Secondary | ICD-10-CM

## 2017-07-04 DIAGNOSIS — E039 Hypothyroidism, unspecified: Secondary | ICD-10-CM

## 2017-07-04 DIAGNOSIS — M549 Dorsalgia, unspecified: Secondary | ICD-10-CM

## 2017-07-04 DIAGNOSIS — G8929 Other chronic pain: Secondary | ICD-10-CM | POA: Diagnosis not present

## 2017-07-04 DIAGNOSIS — F419 Anxiety disorder, unspecified: Secondary | ICD-10-CM | POA: Diagnosis not present

## 2017-07-04 DIAGNOSIS — E1165 Type 2 diabetes mellitus with hyperglycemia: Secondary | ICD-10-CM | POA: Diagnosis not present

## 2017-07-04 DIAGNOSIS — E782 Mixed hyperlipidemia: Secondary | ICD-10-CM | POA: Diagnosis not present

## 2017-07-04 DIAGNOSIS — G479 Sleep disorder, unspecified: Secondary | ICD-10-CM | POA: Diagnosis not present

## 2017-07-04 LAB — COMPREHENSIVE METABOLIC PANEL
ALK PHOS: 90 U/L (ref 39–117)
ALT: 8 U/L (ref 0–35)
AST: 14 U/L (ref 0–37)
Albumin: 4.3 g/dL (ref 3.5–5.2)
BILIRUBIN TOTAL: 0.7 mg/dL (ref 0.2–1.2)
BUN: 17 mg/dL (ref 6–23)
CALCIUM: 10 mg/dL (ref 8.4–10.5)
CO2: 27 mEq/L (ref 19–32)
Chloride: 100 mEq/L (ref 96–112)
Creatinine, Ser: 1.16 mg/dL (ref 0.40–1.20)
GFR: 47.11 mL/min — AB (ref >60.00)
Glucose, Bld: 185 mg/dL — ABNORMAL HIGH (ref 70–99)
Potassium: 4 mEq/L (ref 3.5–5.1)
Sodium: 136 mEq/L (ref 135–145)
TOTAL PROTEIN: 7.5 g/dL (ref 6.0–8.3)

## 2017-07-04 LAB — HEMOGLOBIN A1C: Hgb A1c MFr Bld: 7.7 % — ABNORMAL HIGH (ref 4.6–6.5)

## 2017-07-04 LAB — TSH: TSH: 41.49 u[IU]/mL — AB (ref 0.35–4.50)

## 2017-07-04 NOTE — Assessment & Plan Note (Signed)
Taking tramadol for pain - takes it daily Pain currently well controlled Continue tramadol - advised to take only as needed

## 2017-07-04 NOTE — Patient Instructions (Signed)
  Test(s) ordered today. Your results will be released to MyChart (or called to you) after review, usually within 72hours after test completion. If any changes need to be made, you will be notified at that same time.   Medications reviewed and updated.  No changes recommended at this time.    Please followup in 3 months   

## 2017-07-04 NOTE — Assessment & Plan Note (Signed)
Check a1c Has not been checking her sugars at home ? Concern regarding compliance of meds in the past Low sugar / carb diet Stressed regular exercise

## 2017-07-04 NOTE — Telephone Encounter (Signed)
[place] Controlled Substance Database checked. Okay to fill RX. Last filled 05/27/17

## 2017-07-04 NOTE — Assessment & Plan Note (Signed)
Continue statin. 

## 2017-07-04 NOTE — Telephone Encounter (Signed)
RX faxed to [facility] 

## 2017-07-04 NOTE — Assessment & Plan Note (Signed)
GERD controlled Continue daily medication  

## 2017-07-04 NOTE — Assessment & Plan Note (Signed)
Controlled, stable Continue current dose of medication  

## 2017-07-04 NOTE — Assessment & Plan Note (Signed)
Check tsh  Titrate med dose if needed  

## 2017-07-05 ENCOUNTER — Encounter: Payer: Self-pay | Admitting: Emergency Medicine

## 2017-07-08 ENCOUNTER — Other Ambulatory Visit: Payer: Self-pay | Admitting: Internal Medicine

## 2017-07-08 DIAGNOSIS — W19XXXD Unspecified fall, subsequent encounter: Secondary | ICD-10-CM

## 2017-07-08 DIAGNOSIS — S2242XA Multiple fractures of ribs, left side, initial encounter for closed fracture: Secondary | ICD-10-CM

## 2017-07-11 ENCOUNTER — Other Ambulatory Visit: Payer: Self-pay | Admitting: Cardiovascular Disease

## 2017-07-11 DIAGNOSIS — I25700 Atherosclerosis of coronary artery bypass graft(s), unspecified, with unstable angina pectoris: Secondary | ICD-10-CM

## 2017-07-11 DIAGNOSIS — I200 Unstable angina: Secondary | ICD-10-CM

## 2017-07-19 ENCOUNTER — Ambulatory Visit: Payer: Medicare Other | Admitting: Internal Medicine

## 2017-07-19 DIAGNOSIS — E039 Hypothyroidism, unspecified: Secondary | ICD-10-CM | POA: Diagnosis not present

## 2017-07-19 DIAGNOSIS — E7800 Pure hypercholesterolemia, unspecified: Secondary | ICD-10-CM | POA: Diagnosis not present

## 2017-07-19 DIAGNOSIS — G609 Hereditary and idiopathic neuropathy, unspecified: Secondary | ICD-10-CM | POA: Diagnosis not present

## 2017-07-19 DIAGNOSIS — E1165 Type 2 diabetes mellitus with hyperglycemia: Secondary | ICD-10-CM | POA: Diagnosis not present

## 2017-07-26 ENCOUNTER — Other Ambulatory Visit (INDEPENDENT_AMBULATORY_CARE_PROVIDER_SITE_OTHER): Payer: Medicare Other

## 2017-07-26 ENCOUNTER — Encounter: Payer: Self-pay | Admitting: Internal Medicine

## 2017-07-26 ENCOUNTER — Ambulatory Visit (INDEPENDENT_AMBULATORY_CARE_PROVIDER_SITE_OTHER): Payer: Medicare Other | Admitting: Internal Medicine

## 2017-07-26 VITALS — BP 98/70 | HR 92 | Temp 97.9°F | Resp 16

## 2017-07-26 DIAGNOSIS — K219 Gastro-esophageal reflux disease without esophagitis: Secondary | ICD-10-CM | POA: Diagnosis not present

## 2017-07-26 DIAGNOSIS — I10 Essential (primary) hypertension: Secondary | ICD-10-CM

## 2017-07-26 DIAGNOSIS — E1165 Type 2 diabetes mellitus with hyperglycemia: Secondary | ICD-10-CM

## 2017-07-26 DIAGNOSIS — G8929 Other chronic pain: Secondary | ICD-10-CM | POA: Diagnosis not present

## 2017-07-26 DIAGNOSIS — M545 Low back pain, unspecified: Secondary | ICD-10-CM

## 2017-07-26 DIAGNOSIS — E039 Hypothyroidism, unspecified: Secondary | ICD-10-CM

## 2017-07-26 DIAGNOSIS — I200 Unstable angina: Secondary | ICD-10-CM

## 2017-07-26 LAB — COMPREHENSIVE METABOLIC PANEL
ALT: 19 U/L (ref 0–35)
AST: 21 U/L (ref 0–37)
Albumin: 4 g/dL (ref 3.5–5.2)
Alkaline Phosphatase: 110 U/L (ref 39–117)
BILIRUBIN TOTAL: 0.9 mg/dL (ref 0.2–1.2)
BUN: 19 mg/dL (ref 6–23)
CALCIUM: 9.6 mg/dL (ref 8.4–10.5)
CO2: 30 mEq/L (ref 19–32)
Chloride: 101 mEq/L (ref 96–112)
Creatinine, Ser: 1.09 mg/dL (ref 0.40–1.20)
GFR: 50.61 mL/min — AB (ref >60.00)
Glucose, Bld: 164 mg/dL — ABNORMAL HIGH (ref 70–99)
Potassium: 4 mEq/L (ref 3.5–5.1)
Sodium: 137 mEq/L (ref 135–145)
TOTAL PROTEIN: 7.6 g/dL (ref 6.0–8.3)

## 2017-07-26 LAB — HEMOGLOBIN A1C: HEMOGLOBIN A1C: 7.2 % — AB (ref 4.6–6.5)

## 2017-07-26 LAB — TSH: TSH: 0.82 u[IU]/mL (ref 0.35–4.50)

## 2017-07-26 NOTE — Assessment & Plan Note (Addendum)
BP Readings from Last 3 Encounters:  07/26/17 98/70  07/04/17 138/82  06/10/17 120/60   BP well controlled Current regimen effective and well tolerated Continue current medications at current doses cmp

## 2017-07-26 NOTE — Assessment & Plan Note (Signed)
Taking tramadol daily as needed Taking cymbalta daily Works well, no side effects Will continue

## 2017-07-26 NOTE — Assessment & Plan Note (Signed)
Check tsh  Titrate med dose if needed  

## 2017-07-26 NOTE — Progress Notes (Signed)
Subjective:    Patient ID: Janet Steele, female    DOB: 27-Aug-1929, 83 y.o.   MRN: [id number]  HPI The patient is here for follow up.  Chronic lower back pain:  She has chronic back pain.  She takes Cymbalta daily and tramadol as needed.   Hypertension: She is taking her medication daily. She is compliant with a low sodium diet.  She denies chest pain, palpitations, edema, shortness of breath and regular headaches. She is not exercising regularly.     Diabetes: She is taking her medication daily as prescribed. She is compliant with a diabetic diet. She is not exercising regularly.  She checks her feet daily and denies foot lesions, but later shows me band- aids covering blisters. She is up-to-date with an ophthalmology examination.   Hypothyroidism:  She is taking her medication daily.  She denies any recent changes in energy or weight that are unexplained.   GERD:  She is taking her medication daily as prescribed.  She denies any GERD symptoms and feels her GERD is well controlled.    Medications and allergies reviewed with patient and updated if appropriate.  Patient Active Problem List   Diagnosis Date Noted  . Chronic back pain 07/04/2017  . Rib pain on left side 04/20/2017  . PAD (peripheral artery disease) ([place]) 01/17/2017  . Overactive bladder 12/17/2016  . Anemia 05/20/2016  . Coronary artery disease involving coronary bypass graft of native heart without angina pectoris 05/19/2016  . Numbness and tingling of foot 12/31/2015  . MCI (mild cognitive impairment) with memory loss 11/18/2015  . Facial tic 05/07/2015  . Facial nerve spasm 05/07/2015  . Sick sinus syndrome ([place]) 02/26/2015  . Cervical dystonia 12/04/2014  . Chronic motor tic 09/12/2014  . Nonspecific abnormal electrocardiogram (ECG) (EKG)   . Unstable angina ([place]) 03/02/2014  . Pacemaker-Medtronic 07/07/2012  . Bradycardia 07/05/2012  . GERD (gastroesophageal reflux disease) 07/05/2012  . Near syncope  12/23/2011  . Abnormal involuntary movements(781.0) 12/17/2009  . Disturbance in sleep behavior 12/11/2009  . CAD, ARTERY BYPASS GRAFT 09/24/2009  . CAROTID BRUIT 09/24/2009  . Diabetes ([place]) 01/30/2009  . DYSPHAGIA 06/19/2008  . Anxiety 02/14/2008  . EROSIVE ESOPHAGITIS 02/14/2008  . CONSTIPATION, CHRONIC 02/14/2008  . DEGENERATIVE JOINT DISEASE 02/14/2008  . HEARING LOSS 01/04/2008  . PVD 01/04/2008  . Hypothyroidism 09/25/2007  . HYPERLIPIDEMIA 09/25/2007  . Essential hypertension 09/07/2007  . Diabetic polyneuropathy ([place]) 03/16/2007  . Stricture and stenosis of esophagus 03/16/2007  . HIATAL HERNIA 02/16/2007  . HEMORRHOIDS 12/31/1997  . DIVERTICULOSIS, COLON 12/31/1997    Current Outpatient Prescriptions on File Prior to Visit  Medication Sig Dispense Refill  . acetaminophen (TYLENOL) 500 MG tablet Take 2 tablets (1,000 mg total) by mouth every 6 (six) hours as needed. 30 tablet 0  . amLODipine (NORVASC) 5 MG tablet take 1 tablet by mouth once daily 30 tablet 0  . aspirin (RA ASPIRIN EC) 81 MG EC tablet Take 1 tablet (81 mg total) by mouth daily. Overdue for follow up. Please call and schedule [phone] 30 tablet 0  . clopidogrel (PLAVIX) 75 MG tablet take 1 tablet by mouth once daily 30 tablet 0  . DULoxetine (CYMBALTA) 60 MG capsule take 1 capsule by mouth once daily 90 capsule 1  . gabapentin (NEURONTIN) 300 MG capsule Take 1 capsule (300 mg total) by mouth at bedtime. 90 capsule 3  . glimepiride (AMARYL) 2 MG tablet Take 2 mg by mouth daily before breakfast.     .  levothyroxine (SYNTHROID, LEVOTHROID) 150 MCG tablet Take 1 tablet (150 mcg total) by mouth daily. 90 tablet 1  . losartan (COZAAR) 50 MG tablet Take 1 tablet (50 mg total) by mouth daily. 30 tablet 5  . metFORMIN (GLUCOPHAGE) 1000 MG tablet Take 1 tablet (1,000 mg total) by mouth 2 (two) times daily with a meal. 180 tablet 1  . metoprolol tartrate (LOPRESSOR) 25 MG tablet take 1 tablet by mouth twice a day  60 tablet 0  . mupirocin ointment (BACTROBAN) 2 %     . nitroGLYCERIN (NITROSTAT) 0.4 MG SL tablet Place 1 tablet (0.4 mg total) under the tongue every 5 (five) minutes as needed. For chest pain 25 tablet 5  . pantoprazole (PROTONIX) 40 MG tablet take 1 tablet by mouth twice a day 60 tablet 3  . rosuvastatin (CRESTOR) 40 MG tablet Take 1 tablet (40 mg total) by mouth daily. 90 tablet 1  . sitaGLIPtin (JANUVIA) 50 MG tablet Take 1 tablet (50 mg total) by mouth daily. 90 tablet 0  . traMADol (ULTRAM) 50 MG tablet take 1 tablet by mouth every 6 hours if needed for MODERATE BACK PAIN 60 tablet 0  . traZODone (DESYREL) 50 MG tablet Take 2 tablets (100 mg total) by mouth at bedtime. 60 tablet 11   No current facility-administered medications on file prior to visit.     Past Medical History:  Diagnosis Date  . Anemia   . Anxiety   . Arthritis    "fingers" (05/19/2016  . CAD (coronary artery disease)    a. s/p CABG 1982 b. redo CABG 2003. c. [place] despite med rx 05/2016: successful but complicated PCI of PDA beyond graft site, c/b localized dissection requiring overlapping stent.  . Carotid bruit    a. Duplex 01/2015: patent vessels, 1-39% BICA, f/u PRN recommened.  . Colon polyp    adenomatous  . Colon, diverticulosis   . Degenerative joint disease   . Diastolic dysfunction   . Esophageal stricture   . GERD (gastroesophageal reflux disease)   . Hiatal hernia   . History of blood transfusion    "related to my bypass"  . Hyperlipidemia   . Hypertension   . Hypothyroidism   . Ischemic cardiomyopathy    a. Cath 05/2016: EF 40% with severe inferior wall HK, suspect hibernating myocardium.  . Myocardial infarction ([place]) 1977; 1982  . Osteoarthrosis, unspecified whether generalized or localized, unspecified site   . Presence of permanent cardiac pacemaker   . PVD (peripheral vascular disease) ([place])   . Type II diabetes mellitus ([place])   . Unspecified hearing loss     Past Surgical History:    Procedure Laterality Date  . ABDOMINAL HYSTERECTOMY    . APPENDECTOMY    . BACK SURGERY    . CARDIAC CATHETERIZATION N/A 05/19/2016   Procedure: Left Heart Cath and Cors/Grafts Angiography;  Surgeon: Belva Crome, MD;  Location: [facility];  Service: Cardiovascular;  Laterality: N/A;  . CARDIAC CATHETERIZATION N/A 05/19/2016   Procedure: Coronary Stent Intervention;  Surgeon: Belva Crome, MD;  Location: [facility];  Service: Cardiovascular;  Laterality: N/A;  . CATARACT EXTRACTION W/ INTRAOCULAR LENS  IMPLANT, BILATERAL Bilateral   . CHOLECYSTECTOMY OPEN    . COLONOSCOPY    . CORONARY ANGIOPLASTY WITH STENT PLACEMENT  05/19/2016   "2 stents?"  . CORONARY ARTERY BYPASS GRAFT  1982; 2003   x4(1982),02-2002 CABG X2  . ESOPHAGOGASTRODUODENOSCOPY (EGD) WITH ESOPHAGEAL DILATION  "2-3 times"  .  INSERT / REPLACE / REMOVE PACEMAKER    . LEFT HEART CATHETERIZATION WITH CORONARY/GRAFT ANGIOGRAM N/A 03/05/2014   Procedure: LEFT HEART CATHETERIZATION WITH Beatrix Fetters;  Surgeon: Sinclair Grooms, MD;  Location: [facility];  Service: Cardiovascular;  Laterality: N/A;  . LUMBAR [place] SURGERY  01-2000  . OOPHORECTOMY Bilateral   . PERMANENT PACEMAKER INSERTION N/A 07/06/2012   MDT Adapta L implanted by Dr Rayann Heman for symptomatic bradycardia  . TONSILLECTOMY  1930s    Social History   Social History  . Marital status: Married    Spouse name: [name]  . Number of children: 3  . Years of education: 16   Occupational History  . Retired    .  Retired   Social History Main Topics  . Smoking status: Former Smoker    Packs/day: 0.50    Years: 30.00    Types: Cigarettes    Quit date: 07/05/1976  . Smokeless tobacco: Never Used  . Alcohol use 1.8 oz/week    3 Glasses of wine per week  . Drug use: No  . Sexual activity: No   Other Topics Concern  . Not on file   Social History Narrative   Patient is married [name]) and lives at home with her husband.   Patient  has three adult children.   Patient is retired.   Patient has a college education.   Patient is right-handed.   Patient does not drink any caffeine.    Family History  Problem Relation Age of Onset  . Heart disease Sister   . Heart attack Sister   . Colon cancer Neg Hx   . Breast cancer Neg Hx   . Celiac disease Neg Hx   . Cirrhosis Neg Hx   . Clotting disorder Neg Hx   . Colitis Neg Hx   . Colon polyps Neg Hx   . Crohn's disease Neg Hx   . Cystic fibrosis Neg Hx   . Diabetes Neg Hx   . Esophageal cancer Neg Hx   . Hemochromatosis Neg Hx   . Inflammatory bowel disease Neg Hx   . Irritable bowel syndrome Neg Hx   . Kidney disease Neg Hx   . Liver cancer Neg Hx   . Liver disease Neg Hx   . Ovarian cancer Neg Hx   . Pancreatic cancer Neg Hx   . Prostate cancer Neg Hx   . Rectal cancer Neg Hx   . Stomach cancer Neg Hx   . Ulcerative colitis Neg Hx   . Uterine cancer Neg Hx   . Wilson's disease Neg Hx     Review of Systems  Constitutional: Negative for appetite change and fever.  Respiratory: Positive for cough. Negative for shortness of breath and wheezing.   Cardiovascular: Negative for chest pain, palpitations and leg swelling.  Neurological: Positive for headaches (intermittent). Negative for light-headedness.  Psychiatric/Behavioral: Positive for sleep disturbance.       Objective:   Vitals:   07/26/17 1345  BP: 98/70  Pulse: 92  Resp: 16  Temp: 97.9 F (36.6 C)  SpO2: 95%   Wt Readings from Last 3 Encounters:  07/04/17 125 lb (56.7 kg)  06/10/17 125 lb 12 oz (57 kg)  04/20/17 125 lb (56.7 kg)   There is no height or weight on file to calculate BMI.   Physical Exam    Constitutional: Appears well-developed and well-nourished. No distress.  HENT:  Head: Normocephalic and atraumatic.  Neck: Neck supple. No tracheal deviation  present. No thyromegaly present.  No cervical lymphadenopathy Cardiovascular: Normal rate, regular rhythm and normal heart  sounds.   No murmur heard. No carotid bruit .  No edema Pulmonary/Chest: Effort normal and breath sounds normal. No respiratory distress. No has no wheezes. No rales.  Skin: Skin is warm and dry. Not diaphoretic.  Psychiatric: Normal mood and affect. Behavior is normal.   Diabetic Foot Exam - Simple   Simple Foot Form Diabetic Foot exam was performed with the following findings:  Yes 07/26/2017  2:03 PM  Visual Inspection No deformities, no ulcerations, no other skin breakdown bilaterally:  Yes See comments:  Yes Sensation Testing Pulse Check Posterior Tibialis and Dorsalis pulse intact bilaterally:  Yes Comments Intact to light touch b/l, band aids covering blisters on both feet       Assessment & Plan:    See Problem List for Assessment and Plan of chronic medical problems.

## 2017-07-26 NOTE — Assessment & Plan Note (Signed)
GERD controlled Continue daily medication  

## 2017-07-26 NOTE — Assessment & Plan Note (Signed)
Check a1c Low sugar / carb diet Stressed staying active - not able to exercise,

## 2017-07-26 NOTE — Patient Instructions (Signed)
  Test(s) ordered today. Your results will be released to [place] (or called to you) after review, usually within 72hours after test completion. If any changes need to be made, you will be notified at that same time.   No immunizations administered today.   Medications reviewed and updated.  No changes recommended at this time.   Please followup in 3 months

## 2017-07-27 ENCOUNTER — Encounter: Payer: Self-pay | Admitting: Internal Medicine

## 2017-08-01 ENCOUNTER — Other Ambulatory Visit: Payer: Self-pay | Admitting: Cardiovascular Disease

## 2017-08-09 ENCOUNTER — Other Ambulatory Visit: Payer: Self-pay | Admitting: Internal Medicine

## 2017-08-09 DIAGNOSIS — S2242XA Multiple fractures of ribs, left side, initial encounter for closed fracture: Secondary | ICD-10-CM

## 2017-08-09 DIAGNOSIS — W19XXXD Unspecified fall, subsequent encounter: Secondary | ICD-10-CM

## 2017-08-09 NOTE — Telephone Encounter (Signed)
Ok to fill.  rx printed

## 2017-08-09 NOTE — Telephone Encounter (Signed)
 Controlled Substance Database checked. Last filled on 07/04/17

## 2017-08-10 NOTE — Telephone Encounter (Signed)
RX faxed to [facility] 

## 2017-08-11 DIAGNOSIS — M25561 Pain in right knee: Secondary | ICD-10-CM | POA: Diagnosis not present

## 2017-09-06 ENCOUNTER — Ambulatory Visit (INDEPENDENT_AMBULATORY_CARE_PROVIDER_SITE_OTHER): Payer: Medicare Other | Admitting: Internal Medicine

## 2017-09-06 ENCOUNTER — Encounter: Payer: Self-pay | Admitting: Internal Medicine

## 2017-09-06 VITALS — BP 120/68 | HR 84 | Resp 16 | Wt 123.0 lb

## 2017-09-06 DIAGNOSIS — I739 Peripheral vascular disease, unspecified: Secondary | ICD-10-CM | POA: Diagnosis not present

## 2017-09-06 DIAGNOSIS — I200 Unstable angina: Secondary | ICD-10-CM

## 2017-09-06 DIAGNOSIS — Z23 Encounter for immunization: Secondary | ICD-10-CM | POA: Diagnosis not present

## 2017-09-06 NOTE — Assessment & Plan Note (Addendum)
Last 2-3 weeks has had episodes of claudication, improved with rest Has non healing blisters on feet She has mild dementia and is a poor historian ? PAD or neurological Has seen Dr Sherren Mocha Early this year but was asymptomatic at the time Advised her to see Dr Early again, but she declined at this time - she would like to see if she has the symptoms again

## 2017-09-06 NOTE — Patient Instructions (Addendum)
Leg pain may be from vascular disease in your legs.  I recommend that you see the vascular doctor again.  If you decide you want to see the vascular doctor please let me know.    Continue your current medications    Flu immunization administered today.

## 2017-09-06 NOTE — Progress Notes (Signed)
Subjective:    Patient ID: Janet Steele, female    DOB: Apr 28, 1929, 83 y.o.   MRN: [id number]  HPI She is here for an acute visit.  She is here by herself and has mild dementia and is not a good historian.    Leg problems:  2-3 weeks ago she was walking and had to stop.  Her legs hurt bad and she had to sit on the sidewalk.  Yesterday she went walking and her legs hurt.  Rest improves the pain.  She has only had two episodes.    She denies back pain.  Her feet are red intermittently. She has known PAD and sfa obstruction.  She has several blisters on her feet that she states are not healing.   She has seen vascular earlier this year, but was not having any of the above symptoms.    Medications and allergies reviewed with patient and updated if appropriate.  Patient Active Problem List   Diagnosis Date Noted  . Chronic back pain 07/04/2017  . Rib pain on left side 04/20/2017  . PAD (peripheral artery disease) ([place]) 01/17/2017  . Overactive bladder 12/17/2016  . Anemia 05/20/2016  . Coronary artery disease involving coronary bypass graft of native heart without angina pectoris 05/19/2016  . Numbness and tingling of foot 12/31/2015  . MCI (mild cognitive impairment) with memory loss 11/18/2015  . Facial tic 05/07/2015  . Facial nerve spasm 05/07/2015  . Sick sinus syndrome ([place]) 02/26/2015  . Cervical dystonia 12/04/2014  . Chronic motor tic 09/12/2014  . Nonspecific abnormal electrocardiogram (ECG) (EKG)   . Unstable angina ([place]) 03/02/2014  . Pacemaker-Medtronic 07/07/2012  . Bradycardia 07/05/2012  . GERD (gastroesophageal reflux disease) 07/05/2012  . Near syncope 12/23/2011  . Abnormal involuntary movements(781.0) 12/17/2009  . Disturbance in sleep behavior 12/11/2009  . CAD, ARTERY BYPASS GRAFT 09/24/2009  . CAROTID BRUIT 09/24/2009  . Diabetes ([place]) 01/30/2009  . DYSPHAGIA 06/19/2008  . Anxiety 02/14/2008  . EROSIVE ESOPHAGITIS 02/14/2008  . CONSTIPATION, CHRONIC  02/14/2008  . DEGENERATIVE JOINT DISEASE 02/14/2008  . HEARING LOSS 01/04/2008  . PVD 01/04/2008  . Hypothyroidism 09/25/2007  . HYPERLIPIDEMIA 09/25/2007  . Essential hypertension 09/07/2007  . Diabetic polyneuropathy ([place]) 03/16/2007  . Stricture and stenosis of esophagus 03/16/2007  . HIATAL HERNIA 02/16/2007  . HEMORRHOIDS 12/31/1997  . DIVERTICULOSIS, COLON 12/31/1997    Current Outpatient Prescriptions on File Prior to Visit  Medication Sig Dispense Refill  . acetaminophen (TYLENOL) 500 MG tablet Take 2 tablets (1,000 mg total) by mouth every 6 (six) hours as needed. 30 tablet 0  . amLODipine (NORVASC) 5 MG tablet take 1 tablet by mouth once daily 30 tablet 0  . clopidogrel (PLAVIX) 75 MG tablet take 1 tablet by mouth once daily 30 tablet 0  . DULoxetine (CYMBALTA) 60 MG capsule take 1 capsule by mouth once daily 90 capsule 1  . gabapentin (NEURONTIN) 300 MG capsule Take 1 capsule (300 mg total) by mouth at bedtime. 90 capsule 3  . glimepiride (AMARYL) 2 MG tablet Take 2 mg by mouth daily before breakfast.     . levothyroxine (SYNTHROID, LEVOTHROID) 150 MCG tablet Take 1 tablet (150 mcg total) by mouth daily. 90 tablet 1  . losartan (COZAAR) 50 MG tablet Take 1 tablet (50 mg total) by mouth daily. 30 tablet 5  . metFORMIN (GLUCOPHAGE) 1000 MG tablet Take 1 tablet (1,000 mg total) by mouth 2 (two) times daily with a meal. 180 tablet 1  .  metoprolol tartrate (LOPRESSOR) 25 MG tablet take 1 tablet by mouth twice a day 60 tablet 0  . mupirocin ointment (BACTROBAN) 2 %     . nitroGLYCERIN (NITROSTAT) 0.4 MG SL tablet Place 1 tablet (0.4 mg total) under the tongue every 5 (five) minutes as needed. For chest pain 25 tablet 5  . pantoprazole (PROTONIX) 40 MG tablet take 1 tablet by mouth twice a day 60 tablet 3  . RA ASPIRIN EC 81 MG EC tablet take 1 tablet by mouth once daily . OVERDUE FOR FOLLOW UP. PLEASE CALL AND SCHEDULE [phone]. 15 tablet 0  . rosuvastatin (CRESTOR) 40 MG  tablet Take 1 tablet (40 mg total) by mouth daily. 90 tablet 1  . sitaGLIPtin (JANUVIA) 50 MG tablet Take 1 tablet (50 mg total) by mouth daily. 90 tablet 0  . traMADol (ULTRAM) 50 MG tablet take 1 tablet by mouth every 6 hours if needed for MODERATE BACK PAIN 60 tablet 0  . traZODone (DESYREL) 50 MG tablet Take 2 tablets (100 mg total) by mouth at bedtime. 60 tablet 11   No current facility-administered medications on file prior to visit.     Past Medical History:  Diagnosis Date  . Anemia   . Anxiety   . Arthritis    "fingers" (05/19/2016  . CAD (coronary artery disease)    a. s/p CABG 1982 b. redo CABG 2003. c. [place] despite med rx 05/2016: successful but complicated PCI of PDA beyond graft site, c/b localized dissection requiring overlapping stent.  . Carotid bruit    a. Duplex 01/2015: patent vessels, 1-39% BICA, f/u PRN recommened.  . Colon polyp    adenomatous  . Colon, diverticulosis   . Degenerative joint disease   . Diastolic dysfunction   . Esophageal stricture   . GERD (gastroesophageal reflux disease)   . Hiatal hernia   . History of blood transfusion    "related to my bypass"  . Hyperlipidemia   . Hypertension   . Hypothyroidism   . Ischemic cardiomyopathy    a. Cath 05/2016: EF 40% with severe inferior wall HK, suspect hibernating myocardium.  . Myocardial infarction ([place]) 1977; 1982  . Osteoarthrosis, unspecified whether generalized or localized, unspecified site   . Presence of permanent cardiac pacemaker   . PVD (peripheral vascular disease) ([place])   . Type II diabetes mellitus ([place])   . Unspecified hearing loss     Past Surgical History:  Procedure Laterality Date  . ABDOMINAL HYSTERECTOMY    . APPENDECTOMY    . BACK SURGERY    . CARDIAC CATHETERIZATION N/A 05/19/2016   Procedure: Left Heart Cath and Cors/Grafts Angiography;  Surgeon: Belva Crome, MD;  Location: [facility];  Service: Cardiovascular;  Laterality: N/A;  . CARDIAC CATHETERIZATION N/A  05/19/2016   Procedure: Coronary Stent Intervention;  Surgeon: Belva Crome, MD;  Location: [facility];  Service: Cardiovascular;  Laterality: N/A;  . CATARACT EXTRACTION W/ INTRAOCULAR LENS  IMPLANT, BILATERAL Bilateral   . CHOLECYSTECTOMY OPEN    . COLONOSCOPY    . CORONARY ANGIOPLASTY WITH STENT PLACEMENT  05/19/2016   "2 stents?"  . CORONARY ARTERY BYPASS GRAFT  1982; 2003   x4(1982),02-2002 CABG X2  . ESOPHAGOGASTRODUODENOSCOPY (EGD) WITH ESOPHAGEAL DILATION  "2-3 times"  . INSERT / REPLACE / REMOVE PACEMAKER    . LEFT HEART CATHETERIZATION WITH CORONARY/GRAFT ANGIOGRAM N/A 03/05/2014   Procedure: LEFT HEART CATHETERIZATION WITH Beatrix Fetters;  Surgeon: Sinclair Grooms, MD;  Location: [facility];  Service: Cardiovascular;  Laterality: N/A;  . LUMBAR [place] SURGERY  01-2000  . OOPHORECTOMY Bilateral   . PERMANENT PACEMAKER INSERTION N/A 07/06/2012   MDT Adapta L implanted by Dr Rayann Heman for symptomatic bradycardia  . TONSILLECTOMY  1930s    Social History   Social History  . Marital status: Married    Spouse name: [name]  . Number of children: 3  . Years of education: 16   Occupational History  . Retired    .  Retired   Social History Main Topics  . Smoking status: Former Smoker    Packs/day: 0.50    Years: 30.00    Types: Cigarettes    Quit date: 07/05/1976  . Smokeless tobacco: Never Used  . Alcohol use 1.8 oz/week    3 Glasses of wine per week  . Drug use: No  . Sexual activity: No   Other Topics Concern  . Not on file   Social History Narrative   Patient is married [name]) and lives at home with her husband.   Patient has three adult children.   Patient is retired.   Patient has a college education.   Patient is right-handed.   Patient does not drink any caffeine.    Family History  Problem Relation Age of Onset  . Heart disease Sister   . Heart attack Sister   . Colon cancer Neg Hx   . Breast cancer Neg Hx   . Celiac disease Neg Hx    . Cirrhosis Neg Hx   . Clotting disorder Neg Hx   . Colitis Neg Hx   . Colon polyps Neg Hx   . Crohn's disease Neg Hx   . Cystic fibrosis Neg Hx   . Diabetes Neg Hx   . Esophageal cancer Neg Hx   . Hemochromatosis Neg Hx   . Inflammatory bowel disease Neg Hx   . Irritable bowel syndrome Neg Hx   . Kidney disease Neg Hx   . Liver cancer Neg Hx   . Liver disease Neg Hx   . Ovarian cancer Neg Hx   . Pancreatic cancer Neg Hx   . Prostate cancer Neg Hx   . Rectal cancer Neg Hx   . Stomach cancer Neg Hx   . Ulcerative colitis Neg Hx   . Uterine cancer Neg Hx   . Wilson's disease Neg Hx     Review of Systems  Constitutional: Negative for fever.  Respiratory: Negative for shortness of breath.   Cardiovascular: Negative for chest pain, palpitations and leg swelling.  Musculoskeletal: Negative for back pain.       Bil leg pain with walking  Skin: Positive for color change.       Blisters on toes - not healing  Neurological: Negative for weakness and numbness.       Objective:   Vitals:   09/06/17 1548  BP: 120/68  Pulse: 84  Resp: 16  SpO2: 94%   Filed Weights   09/06/17 1548  Weight: 123 lb (55.8 kg)   Body mass index is 24.02 kg/m.  Wt Readings from Last 3 Encounters:  09/06/17 123 lb (55.8 kg)  07/04/17 125 lb (56.7 kg)  06/10/17 125 lb 12 oz (57 kg)     Physical Exam  Constitutional: She appears well-developed and well-nourished. No distress.  Cardiovascular:  Non palpable pedal pulses b/l  Musculoskeletal: She exhibits no edema or tenderness (in b/l legs).  Skin: She is not diaphoretic.  Dusky  appearance of toes b/l, several Band-Aids on toes from blisters that are not healing - she is unable to tell me how long they have been there          Assessment & Plan:   See Problem List for Assessment and Plan of chronic medical problems.

## 2017-09-12 ENCOUNTER — Other Ambulatory Visit: Payer: Self-pay | Admitting: Cardiovascular Disease

## 2017-09-12 DIAGNOSIS — I25700 Atherosclerosis of coronary artery bypass graft(s), unspecified, with unstable angina pectoris: Secondary | ICD-10-CM

## 2017-09-12 DIAGNOSIS — I200 Unstable angina: Secondary | ICD-10-CM

## 2017-09-26 ENCOUNTER — Other Ambulatory Visit: Payer: Self-pay | Admitting: Cardiovascular Disease

## 2017-09-26 ENCOUNTER — Other Ambulatory Visit: Payer: Self-pay | Admitting: Internal Medicine

## 2017-09-26 DIAGNOSIS — S2242XA Multiple fractures of ribs, left side, initial encounter for closed fracture: Secondary | ICD-10-CM

## 2017-09-26 DIAGNOSIS — W19XXXD Unspecified fall, subsequent encounter: Secondary | ICD-10-CM

## 2017-09-26 DIAGNOSIS — I25700 Atherosclerosis of coronary artery bypass graft(s), unspecified, with unstable angina pectoris: Secondary | ICD-10-CM

## 2017-09-26 DIAGNOSIS — I200 Unstable angina: Secondary | ICD-10-CM

## 2017-09-26 NOTE — Telephone Encounter (Signed)
[place] Controlled Substance Database checked. Last filled on 08/10/17

## 2017-09-26 NOTE — Telephone Encounter (Signed)
RX faxed to [facility] 

## 2017-10-05 ENCOUNTER — Ambulatory Visit: Payer: Medicare Other | Admitting: Internal Medicine

## 2017-10-24 ENCOUNTER — Other Ambulatory Visit: Payer: Self-pay

## 2017-10-24 DIAGNOSIS — I200 Unstable angina: Secondary | ICD-10-CM

## 2017-10-24 DIAGNOSIS — I25700 Atherosclerosis of coronary artery bypass graft(s), unspecified, with unstable angina pectoris: Secondary | ICD-10-CM

## 2017-10-24 MED ORDER — CLOPIDOGREL BISULFATE 75 MG PO TABS: 75.0000 mg | ORAL_TABLET | Freq: Every day | ORAL | 0 refills | Status: DC

## 2017-11-01 ENCOUNTER — Ambulatory Visit (INDEPENDENT_AMBULATORY_CARE_PROVIDER_SITE_OTHER): Payer: Medicare Other | Admitting: Internal Medicine

## 2017-11-01 ENCOUNTER — Encounter: Payer: Self-pay | Admitting: Internal Medicine

## 2017-11-01 ENCOUNTER — Other Ambulatory Visit (INDEPENDENT_AMBULATORY_CARE_PROVIDER_SITE_OTHER): Payer: Medicare Other

## 2017-11-01 VITALS — BP 130/70 | HR 84 | Temp 97.8°F | Resp 16 | Wt 127.0 lb

## 2017-11-01 DIAGNOSIS — J069 Acute upper respiratory infection, unspecified: Secondary | ICD-10-CM

## 2017-11-01 DIAGNOSIS — M545 Low back pain, unspecified: Secondary | ICD-10-CM

## 2017-11-01 DIAGNOSIS — E039 Hypothyroidism, unspecified: Secondary | ICD-10-CM

## 2017-11-01 DIAGNOSIS — I200 Unstable angina: Secondary | ICD-10-CM | POA: Diagnosis not present

## 2017-11-01 DIAGNOSIS — E1165 Type 2 diabetes mellitus with hyperglycemia: Secondary | ICD-10-CM | POA: Diagnosis not present

## 2017-11-01 DIAGNOSIS — I10 Essential (primary) hypertension: Secondary | ICD-10-CM

## 2017-11-01 DIAGNOSIS — E782 Mixed hyperlipidemia: Secondary | ICD-10-CM | POA: Diagnosis not present

## 2017-11-01 DIAGNOSIS — G8929 Other chronic pain: Secondary | ICD-10-CM | POA: Diagnosis not present

## 2017-11-01 LAB — COMPREHENSIVE METABOLIC PANEL
ALT: 11 U/L (ref 0–35)
AST: 15 U/L (ref 0–37)
Albumin: 4.2 g/dL (ref 3.5–5.2)
Alkaline Phosphatase: 103 U/L (ref 39–117)
BILIRUBIN TOTAL: 0.5 mg/dL (ref 0.2–1.2)
BUN: 19 mg/dL (ref 6–23)
CALCIUM: 9.7 mg/dL (ref 8.4–10.5)
CO2: 26 meq/L (ref 19–32)
Chloride: 99 mEq/L (ref 96–112)
Creatinine, Ser: 1.06 mg/dL (ref 0.40–1.20)
GFR: 52.23 mL/min — AB (ref >60.00)
Glucose, Bld: 259 mg/dL — ABNORMAL HIGH (ref 70–99)
POTASSIUM: 4.5 meq/L (ref 3.5–5.1)
Sodium: 135 mEq/L (ref 135–145)
Total Protein: 7.6 g/dL (ref 6.0–8.3)

## 2017-11-01 LAB — LIPID PANEL
CHOL/HDL RATIO: 10
CHOLESTEROL: 359 mg/dL — AB (ref 0–200)
HDL: 34.6 mg/dL — AB (ref >39.00)
NonHDL: 324.65
TRIGLYCERIDES: 372 mg/dL — AB (ref 0.0–149.0)
VLDL: 74.4 mg/dL — AB (ref 0.0–40.0)

## 2017-11-01 LAB — HEMOGLOBIN A1C: Hgb A1c MFr Bld: 7.1 % — ABNORMAL HIGH (ref 4.6–6.5)

## 2017-11-01 LAB — LDL CHOLESTEROL, DIRECT: LDL DIRECT: 254 mg/dL

## 2017-11-01 LAB — TSH: TSH: 12 u[IU]/mL — ABNORMAL HIGH (ref 0.35–4.50)

## 2017-11-01 MED ORDER — SITAGLIPTIN PHOSPHATE 50 MG PO TABS: 50.0000 mg | ORAL_TABLET | Freq: Every day | ORAL | 1 refills | Status: DC

## 2017-11-01 MED ORDER — LEVOTHYROXINE SODIUM 150 MCG PO TABS: 150.0000 ug | ORAL_TABLET | Freq: Every day | ORAL | 1 refills | Status: DC

## 2017-11-01 MED ORDER — TRAMADOL HCL 50 MG PO TABS: ORAL_TABLET | ORAL | 0 refills | Status: DC

## 2017-11-01 NOTE — Patient Instructions (Addendum)

## 2017-11-01 NOTE — Progress Notes (Signed)
Subjective:    Patient ID: Janet Steele, female    DOB: 1929-02-25, 83 y.o.   MRN: [id number]  HPI The patient is here for follow up.  Cold symptoms:  She has coughed and sneezed and has had trouble sleeping the past couple of nights.  Her cough is productive of clear thick sputum.  She denies fevers.  She has some nasal congestion, sneezing and sinus pressure.  She has been taking over-the-counter cold medications and cough syrup.  Her symptoms just started couple of days ago.  She would like to avoid an antibiotic.  Hypertension: She is taking her medication daily. She is compliant with a low sodium diet.  She denies chest pain, palpitations, edema, and regular headaches. She is not exercising regularly.  She does not monitor her blood pressure at home.    Diabetes: She is taking her medication daily as prescribed. She is compliant with a diabetic diet. She is not exercising regularly.  She checks her feet daily and denies foot lesions. She is up-to-date with an ophthalmology examination.   Hypothyroidism:  She is taking her medication daily.  She denies any recent changes in energy or weight that are unexplained.  She describes her energy level is low.  This is not new.  Hyperlipidemia: She is taking her medication daily. She is compliant with a low fat/cholesterol diet. She is not exercising regularly. She denies myalgias.     Medications and allergies reviewed with patient and updated if appropriate.  Patient Active Problem List   Diagnosis Date Noted  . Chronic back pain 07/04/2017  . Rib pain on left side 04/20/2017  . PAD (peripheral artery disease) ([place]) 01/17/2017  . Overactive bladder 12/17/2016  . Anemia 05/20/2016  . Coronary artery disease involving coronary bypass graft of native heart without angina pectoris 05/19/2016  . Numbness and tingling of foot 12/31/2015  . MCI (mild cognitive impairment) with memory loss 11/18/2015  . Facial tic 05/07/2015  . Facial  nerve spasm 05/07/2015  . Sick sinus syndrome ([place]) 02/26/2015  . Cervical dystonia 12/04/2014  . Chronic motor tic 09/12/2014  . Nonspecific abnormal electrocardiogram (ECG) (EKG)   . Unstable angina ([place]) 03/02/2014  . Pacemaker-Medtronic 07/07/2012  . Bradycardia 07/05/2012  . GERD (gastroesophageal reflux disease) 07/05/2012  . Near syncope 12/23/2011  . Abnormal involuntary movements(781.0) 12/17/2009  . Disturbance in sleep behavior 12/11/2009  . CAD, ARTERY BYPASS GRAFT 09/24/2009  . CAROTID BRUIT 09/24/2009  . Diabetes ([place]) 01/30/2009  . DYSPHAGIA 06/19/2008  . Anxiety 02/14/2008  . EROSIVE ESOPHAGITIS 02/14/2008  . CONSTIPATION, CHRONIC 02/14/2008  . DEGENERATIVE JOINT DISEASE 02/14/2008  . HEARING LOSS 01/04/2008  . PVD 01/04/2008  . Hypothyroidism 09/25/2007  . HYPERLIPIDEMIA 09/25/2007  . Essential hypertension 09/07/2007  . Diabetic polyneuropathy ([place]) 03/16/2007  . Stricture and stenosis of esophagus 03/16/2007  . HIATAL HERNIA 02/16/2007  . HEMORRHOIDS 12/31/1997  . DIVERTICULOSIS, COLON 12/31/1997    Current Outpatient Medications on File Prior to Visit  Medication Sig Dispense Refill  . acetaminophen (TYLENOL) 500 MG tablet Take 2 tablets (1,000 mg total) by mouth every 6 (six) hours as needed. 30 tablet 0  . amLODipine (NORVASC) 5 MG tablet take 1 tablet by mouth once daily 30 tablet 0  . clopidogrel (PLAVIX) 75 MG tablet Take 1 tablet (75 mg total) daily by mouth. 15 tablet 0  . DULoxetine (CYMBALTA) 60 MG capsule take 1 capsule by mouth once daily 90 capsule 1  . gabapentin (NEURONTIN) 300 MG  capsule Take 1 capsule (300 mg total) by mouth at bedtime. 90 capsule 3  . glimepiride (AMARYL) 2 MG tablet Take 2 mg by mouth daily before breakfast.     . levothyroxine (SYNTHROID, LEVOTHROID) 150 MCG tablet Take 1 tablet (150 mcg total) by mouth daily. 90 tablet 1  . losartan (COZAAR) 50 MG tablet Take 1 tablet (50 mg total) by mouth daily. 30 tablet 5  .  metFORMIN (GLUCOPHAGE) 1000 MG tablet Take 1 tablet (1,000 mg total) by mouth 2 (two) times daily with a meal. 180 tablet 1  . metoprolol tartrate (LOPRESSOR) 25 MG tablet take 1 tablet by mouth twice a day 30 tablet 0  . mupirocin ointment (BACTROBAN) 2 %     . nitroGLYCERIN (NITROSTAT) 0.4 MG SL tablet Place 1 tablet (0.4 mg total) under the tongue every 5 (five) minutes as needed. For chest pain 25 tablet 5  . pantoprazole (PROTONIX) 40 MG tablet take 1 tablet by mouth twice a day 60 tablet 3  . RA ASPIRIN EC 81 MG EC tablet take 1 tablet by mouth once daily . OVERDUE FOR FOLLOW UP. PLEASE CALL AND SCHEDULE [phone]. 15 tablet 0  . rosuvastatin (CRESTOR) 40 MG tablet Take 1 tablet (40 mg total) by mouth daily. 90 tablet 1  . sitaGLIPtin (JANUVIA) 50 MG tablet Take 1 tablet (50 mg total) by mouth daily. 90 tablet 0  . traMADol (ULTRAM) 50 MG tablet take 1 tablet by mouth every 6 hours if needed for MODERATE PAIN 60 tablet 0  . traZODone (DESYREL) 50 MG tablet Take 2 tablets (100 mg total) by mouth at bedtime. 60 tablet 11   No current facility-administered medications on file prior to visit.     Past Medical History:  Diagnosis Date  . Anemia   . Anxiety   . Arthritis    "fingers" (05/19/2016  . CAD (coronary artery disease)    a. s/p CABG 1982 b. redo CABG 2003. c. [place] despite med rx 05/2016: successful but complicated PCI of PDA beyond graft site, c/b localized dissection requiring overlapping stent.  . Carotid bruit    a. Duplex 01/2015: patent vessels, 1-39% BICA, f/u PRN recommened.  . Colon polyp    adenomatous  . Colon, diverticulosis   . Degenerative joint disease   . Diastolic dysfunction   . Esophageal stricture   . GERD (gastroesophageal reflux disease)   . Hiatal hernia   . History of blood transfusion    "related to my bypass"  . Hyperlipidemia   . Hypertension   . Hypothyroidism   . Ischemic cardiomyopathy    a. Cath 05/2016: EF 40% with severe inferior wall HK,  suspect hibernating myocardium.  . Myocardial infarction ([place]) 1977; 1982  . Osteoarthrosis, unspecified whether generalized or localized, unspecified site   . Presence of permanent cardiac pacemaker   . PVD (peripheral vascular disease) ([place])   . Type II diabetes mellitus ([place])   . Unspecified hearing loss     Past Surgical History:  Procedure Laterality Date  . ABDOMINAL HYSTERECTOMY    . APPENDECTOMY    . BACK SURGERY    . CARDIAC CATHETERIZATION N/A 05/19/2016   Procedure: Left Heart Cath and Cors/Grafts Angiography;  Surgeon: Belva Crome, MD;  Location: [facility];  Service: Cardiovascular;  Laterality: N/A;  . CARDIAC CATHETERIZATION N/A 05/19/2016   Procedure: Coronary Stent Intervention;  Surgeon: Belva Crome, MD;  Location: [facility];  Service: Cardiovascular;  Laterality: N/A;  .  CATARACT EXTRACTION W/ INTRAOCULAR LENS  IMPLANT, BILATERAL Bilateral   . CHOLECYSTECTOMY OPEN    . COLONOSCOPY    . CORONARY ANGIOPLASTY WITH STENT PLACEMENT  05/19/2016   "2 stents?"  . CORONARY ARTERY BYPASS GRAFT  1982; 2003   x4(1982),02-2002 CABG X2  . ESOPHAGOGASTRODUODENOSCOPY (EGD) WITH ESOPHAGEAL DILATION  "2-3 times"  . INSERT / REPLACE / REMOVE PACEMAKER    . LEFT HEART CATHETERIZATION WITH CORONARY/GRAFT ANGIOGRAM N/A 03/05/2014   Procedure: LEFT HEART CATHETERIZATION WITH Beatrix Fetters;  Surgeon: Sinclair Grooms, MD;  Location[facility];  Service: Cardiovascular;  Laterality: N/A;  . LUMBAR [place] SURGERY  01-2000  . OOPHORECTOMY Bilateral   . PERMANENT PACEMAKER INSERTION N/A 07/06/2012   MDT Adapta L implanted by Dr Rayann Heman for symptomatic bradycardia  . TONSILLECTOMY  1930s    Social History   Socioeconomic History  . Marital status: Married    Spouse name: [name]  . Number of children: 3  . Years of education: 42  . Highest education level: None  Social Needs  . Financial resource strain: None  . Food insecurity - worry: None  . Food  insecurity - inability: None  . Transportation needs - medical: None  . Transportation needs - non-medical: None  Occupational History  . Occupation: Retired     Fish farm manager: RETIRED  Tobacco Use  . Smoking status: Former Smoker    Packs/day: 0.50    Years: 30.00    Pack years: 15.00    Types: Cigarettes    Last attempt to quit: 07/05/1976    Years since quitting: 41.3  . Smokeless tobacco: Never Used  Substance and Sexual Activity  . Alcohol use: Yes    Alcohol/week: 1.8 oz    Types: 3 Glasses of wine per week  . Drug use: No  . Sexual activity: No  Other Topics Concern  . None  Social History Narrative   Patient is married [name]) and lives at home with her husband.   Patient has three adult children.   Patient is retired.   Patient has a college education.   Patient is right-handed.   Patient does not drink any caffeine.    Family History  Problem Relation Age of Onset  . Heart disease Sister   . Heart attack Sister   . Colon cancer Neg Hx   . Breast cancer Neg Hx   . Celiac disease Neg Hx   . Cirrhosis Neg Hx   . Clotting disorder Neg Hx   . Colitis Neg Hx   . Colon polyps Neg Hx   . Crohn's disease Neg Hx   . Cystic fibrosis Neg Hx   . Diabetes Neg Hx   . Esophageal cancer Neg Hx   . Hemochromatosis Neg Hx   . Inflammatory bowel disease Neg Hx   . Irritable bowel syndrome Neg Hx   . Kidney disease Neg Hx   . Liver cancer Neg Hx   . Liver disease Neg Hx   . Ovarian cancer Neg Hx   . Pancreatic cancer Neg Hx   . Prostate cancer Neg Hx   . Rectal cancer Neg Hx   . Stomach cancer Neg Hx   . Ulcerative colitis Neg Hx   . Uterine cancer Neg Hx   . Wilson's disease Neg Hx     Review of Systems  Constitutional: Negative for chills and fever.  HENT: Positive for congestion, ear pain, sinus pressure and sneezing. Negative for sore throat.   Respiratory:  Positive for cough, shortness of breath (chronic) and wheezing.   Cardiovascular: Negative for chest  pain, palpitations and leg swelling.  Neurological: Positive for headaches. Negative for light-headedness.       Objective:   Vitals:   11/01/17 1554  BP: 130/70  Pulse: 84  Resp: 16  Temp: 97.8 F (36.6 C)  SpO2: 94%   Wt Readings from Last 3 Encounters:  11/01/17 127 lb (57.6 kg)  09/06/17 123 lb (55.8 kg)  07/04/17 125 lb (56.7 kg)   Body mass index is 24.8 kg/m.   Physical Exam    Constitutional: Appears well-developed and well-nourished. No distress.  HENT:  Head: Normocephalic and atraumatic.  Neck: Neck supple. No tracheal deviation present. No thyromegaly present.  No cervical lymphadenopathy.  Bilateral ear canals and tympanic membranes normal.  No oropharynx erythema. Cardiovascular: Normal rate, regular rhythm and normal heart sounds.   No murmur heard. No carotid bruit .  No edema Pulmonary/Chest: Effort normal and breath sounds normal. No respiratory distress. No has no wheezes. No rales.  Skin: Skin is warm and dry. Not diaphoretic.  Psychiatric: Normal mood and affect. Behavior is normal.      Assessment & Plan:    See Problem List for Assessment and Plan of chronic medical problems.  Follow-up in 6 months

## 2017-11-01 NOTE — Assessment & Plan Note (Signed)
Taking tramadol daily for chronic back pain and pain all over Has been on it for years, works well and has no side effects We will continue-refilled today

## 2017-11-01 NOTE — Assessment & Plan Note (Signed)
Clinically euthyroid Check TSH We will titrate medication if needed

## 2017-11-01 NOTE — Assessment & Plan Note (Signed)
BP well controlled Current regimen effective and well tolerated Continue current medications at current doses  

## 2017-11-01 NOTE — Assessment & Plan Note (Signed)
Check lipid panel  Continue daily statin Regular exercise and healthy diet encouraged  

## 2017-11-01 NOTE — Assessment & Plan Note (Signed)
Her symptoms are consistent with a viral infection Continue symptomatic treatment Call if no improvement

## 2017-11-01 NOTE — Assessment & Plan Note (Signed)
Check A1c Continue current medication-we will adjust if needed Not able to exercise States she is compliant with a diabetic diet Follow-up in 6 months

## 2017-11-02 ENCOUNTER — Encounter: Payer: Self-pay | Admitting: Emergency Medicine

## 2017-11-02 ENCOUNTER — Other Ambulatory Visit: Payer: Self-pay | Admitting: Emergency Medicine

## 2017-11-02 DIAGNOSIS — E782 Mixed hyperlipidemia: Secondary | ICD-10-CM

## 2017-11-02 MED ORDER — ROSUVASTATIN CALCIUM 40 MG PO TABS: 40.0000 mg | ORAL_TABLET | Freq: Every day | ORAL | 1 refills | Status: DC

## 2017-11-03 ENCOUNTER — Encounter (HOSPITAL_COMMUNITY): Payer: Self-pay | Admitting: Internal Medicine

## 2017-11-03 ENCOUNTER — Emergency Department (HOSPITAL_COMMUNITY): Payer: Medicare Other

## 2017-11-03 ENCOUNTER — Ambulatory Visit: Payer: Self-pay | Admitting: *Deleted

## 2017-11-03 ENCOUNTER — Inpatient Hospital Stay (HOSPITAL_COMMUNITY)
Admission: EM | Admit: 2017-11-03 | Discharge: 2017-11-08 | DRG: 065 | Disposition: A | Discharge status: Skilled Nursing Facility | Payer: Medicare Other | Attending: Family Medicine | Admitting: Family Medicine

## 2017-11-03 DIAGNOSIS — F39 Unspecified mood [affective] disorder: Secondary | ICD-10-CM | POA: Diagnosis present

## 2017-11-03 DIAGNOSIS — G8929 Other chronic pain: Secondary | ICD-10-CM

## 2017-11-03 DIAGNOSIS — Z7984 Long term (current) use of oral hypoglycemic drugs: Secondary | ICD-10-CM

## 2017-11-03 DIAGNOSIS — I639 Cerebral infarction, unspecified: Principal | ICD-10-CM | POA: Diagnosis present

## 2017-11-03 DIAGNOSIS — I2510 Atherosclerotic heart disease of native coronary artery without angina pectoris: Secondary | ICD-10-CM | POA: Diagnosis not present

## 2017-11-03 DIAGNOSIS — K219 Gastro-esophageal reflux disease without esophagitis: Secondary | ICD-10-CM | POA: Diagnosis not present

## 2017-11-03 DIAGNOSIS — E1165 Type 2 diabetes mellitus with hyperglycemia: Secondary | ICD-10-CM

## 2017-11-03 DIAGNOSIS — E039 Hypothyroidism, unspecified: Secondary | ICD-10-CM | POA: Diagnosis present

## 2017-11-03 DIAGNOSIS — Z8249 Family history of ischemic heart disease and other diseases of the circulatory system: Secondary | ICD-10-CM

## 2017-11-03 DIAGNOSIS — R270 Ataxia, unspecified: Secondary | ICD-10-CM | POA: Diagnosis present

## 2017-11-03 DIAGNOSIS — E785 Hyperlipidemia, unspecified: Secondary | ICD-10-CM | POA: Diagnosis present

## 2017-11-03 DIAGNOSIS — I252 Old myocardial infarction: Secondary | ICD-10-CM

## 2017-11-03 DIAGNOSIS — R404 Transient alteration of awareness: Secondary | ICD-10-CM | POA: Diagnosis not present

## 2017-11-03 DIAGNOSIS — H9190 Unspecified hearing loss, unspecified ear: Secondary | ICD-10-CM | POA: Diagnosis present

## 2017-11-03 DIAGNOSIS — I6523 Occlusion and stenosis of bilateral carotid arteries: Secondary | ICD-10-CM | POA: Diagnosis present

## 2017-11-03 DIAGNOSIS — E782 Mixed hyperlipidemia: Secondary | ICD-10-CM | POA: Diagnosis not present

## 2017-11-03 DIAGNOSIS — G8191 Hemiplegia, unspecified affecting right dominant side: Secondary | ICD-10-CM | POA: Diagnosis not present

## 2017-11-03 DIAGNOSIS — E038 Other specified hypothyroidism: Secondary | ICD-10-CM

## 2017-11-03 DIAGNOSIS — M545 Low back pain: Secondary | ICD-10-CM

## 2017-11-03 DIAGNOSIS — E119 Type 2 diabetes mellitus without complications: Secondary | ICD-10-CM

## 2017-11-03 DIAGNOSIS — R29700 NIHSS score 0: Secondary | ICD-10-CM | POA: Diagnosis present

## 2017-11-03 DIAGNOSIS — I63019 Cerebral infarction due to thrombosis of unspecified vertebral artery: Secondary | ICD-10-CM

## 2017-11-03 DIAGNOSIS — G3184 Mild cognitive impairment, so stated: Secondary | ICD-10-CM | POA: Diagnosis present

## 2017-11-03 DIAGNOSIS — Z87891 Personal history of nicotine dependence: Secondary | ICD-10-CM

## 2017-11-03 DIAGNOSIS — Z880 Allergy status to penicillin: Secondary | ICD-10-CM

## 2017-11-03 DIAGNOSIS — R358 Other polyuria: Secondary | ICD-10-CM | POA: Diagnosis not present

## 2017-11-03 DIAGNOSIS — I10 Essential (primary) hypertension: Secondary | ICD-10-CM | POA: Diagnosis present

## 2017-11-03 DIAGNOSIS — Z950 Presence of cardiac pacemaker: Secondary | ICD-10-CM | POA: Diagnosis not present

## 2017-11-03 DIAGNOSIS — F03918 Unspecified dementia, unspecified severity, with other behavioral disturbance: Secondary | ICD-10-CM | POA: Diagnosis present

## 2017-11-03 DIAGNOSIS — R531 Weakness: Secondary | ICD-10-CM | POA: Diagnosis not present

## 2017-11-03 DIAGNOSIS — Z885 Allergy status to narcotic agent status: Secondary | ICD-10-CM

## 2017-11-03 DIAGNOSIS — Z881 Allergy status to other antibiotic agents status: Secondary | ICD-10-CM

## 2017-11-03 DIAGNOSIS — I6930 Unspecified sequelae of cerebral infarction: Secondary | ICD-10-CM | POA: Diagnosis present

## 2017-11-03 DIAGNOSIS — Z888 Allergy status to other drugs, medicaments and biological substances status: Secondary | ICD-10-CM

## 2017-11-03 DIAGNOSIS — J449 Chronic obstructive pulmonary disease, unspecified: Secondary | ICD-10-CM | POA: Diagnosis not present

## 2017-11-03 DIAGNOSIS — Z951 Presence of aortocoronary bypass graft: Secondary | ICD-10-CM

## 2017-11-03 DIAGNOSIS — Z955 Presence of coronary angioplasty implant and graft: Secondary | ICD-10-CM

## 2017-11-03 DIAGNOSIS — E1151 Type 2 diabetes mellitus with diabetic peripheral angiopathy without gangrene: Secondary | ICD-10-CM | POA: Diagnosis present

## 2017-11-03 DIAGNOSIS — R1310 Dysphagia, unspecified: Secondary | ICD-10-CM | POA: Diagnosis present

## 2017-11-03 DIAGNOSIS — F0390 Unspecified dementia without behavioral disturbance: Secondary | ICD-10-CM | POA: Diagnosis present

## 2017-11-03 DIAGNOSIS — R259 Unspecified abnormal involuntary movements: Secondary | ICD-10-CM | POA: Diagnosis present

## 2017-11-03 DIAGNOSIS — I255 Ischemic cardiomyopathy: Secondary | ICD-10-CM | POA: Diagnosis present

## 2017-11-03 DIAGNOSIS — Z882 Allergy status to sulfonamides status: Secondary | ICD-10-CM

## 2017-11-03 DIAGNOSIS — Z7982 Long term (current) use of aspirin: Secondary | ICD-10-CM

## 2017-11-03 DIAGNOSIS — I119 Hypertensive heart disease without heart failure: Secondary | ICD-10-CM | POA: Diagnosis present

## 2017-11-03 DIAGNOSIS — Z7902 Long term (current) use of antithrombotics/antiplatelets: Secondary | ICD-10-CM

## 2017-11-03 LAB — URINALYSIS, ROUTINE W REFLEX MICROSCOPIC
BILIRUBIN URINE: NEGATIVE
GLUCOSE, UA: 50 mg/dL — AB
Hgb urine dipstick: NEGATIVE
Ketones, ur: NEGATIVE mg/dL
Nitrite: NEGATIVE
PH: 5 (ref 5.0–8.0)
PROTEIN: NEGATIVE mg/dL
Specific Gravity, Urine: 1.017 (ref 1.005–1.030)

## 2017-11-03 LAB — CBC WITH DIFFERENTIAL/PLATELET
BASOS ABS: 0 10*3/uL (ref 0.0–0.1)
Basophils Relative: 0 %
Eosinophils Absolute: 0.1 10*3/uL (ref 0.0–0.7)
Eosinophils Relative: 1 %
HCT: 40.9 % (ref 36.0–46.0)
HEMOGLOBIN: 13.9 g/dL (ref 12.0–15.0)
LYMPHS ABS: 1.2 10*3/uL (ref 0.7–4.0)
LYMPHS PCT: 16 %
MCH: 27.7 pg (ref 26.0–34.0)
MCHC: 34 g/dL (ref 30.0–36.0)
MCV: 81.6 fL (ref 78.0–100.0)
Monocytes Absolute: 0.6 10*3/uL (ref 0.1–1.0)
Monocytes Relative: 7 %
NEUTROS ABS: 5.8 10*3/uL (ref 1.7–7.7)
NEUTROS PCT: 76 %
PLATELETS: 289 10*3/uL (ref 150–400)
RBC: 5.01 MIL/uL (ref 3.87–5.11)
RDW: 14.6 % (ref 11.5–15.5)
WBC: 7.8 10*3/uL (ref 4.0–10.5)

## 2017-11-03 LAB — COMPREHENSIVE METABOLIC PANEL
ALT: 16 U/L (ref 14–54)
ANION GAP: 10 (ref 5–15)
AST: 24 U/L (ref 15–41)
Albumin: 4.3 g/dL (ref 3.5–5.0)
Alkaline Phosphatase: 117 U/L (ref 38–126)
BILIRUBIN TOTAL: 1.2 mg/dL (ref 0.3–1.2)
BUN: 13 mg/dL (ref 6–20)
CHLORIDE: 100 mmol/L — AB (ref 101–111)
CO2: 26 mmol/L (ref 22–32)
Calcium: 9.2 mg/dL (ref 8.9–10.3)
Creatinine, Ser: 0.94 mg/dL (ref 0.44–1.00)
GFR, EST NON AFRICAN AMERICAN: 53 mL/min — AB (ref >60)
Glucose, Bld: 172 mg/dL — ABNORMAL HIGH (ref 65–99)
POTASSIUM: 3.8 mmol/L (ref 3.5–5.1)
Sodium: 136 mmol/L (ref 135–145)
TOTAL PROTEIN: 7.8 g/dL (ref 6.5–8.1)

## 2017-11-03 LAB — CK: CK TOTAL: 209 U/L (ref 38–234)

## 2017-11-03 LAB — TROPONIN I: Troponin I: <0.03 ng/mL (ref <0.03)

## 2017-11-03 LAB — TSH: TSH: 18.878 u[IU]/mL — ABNORMAL HIGH (ref 0.350–4.500)

## 2017-11-03 MED ORDER — SODIUM CHLORIDE 0.9 % IV SOLN
INTRAVENOUS | Status: DC
  Administered 2017-11-03: 16:00:00 via INTRAVENOUS

## 2017-11-03 MED ORDER — SENNOSIDES-DOCUSATE SODIUM 8.6-50 MG PO TABS
1.0000 | ORAL_TABLET | Freq: Every evening | ORAL | Status: DC | PRN
  Administered 2017-11-06: 1 via ORAL
  Filled 2017-11-03: qty 1

## 2017-11-03 MED ORDER — LEVOTHYROXINE SODIUM 100 MCG IV SOLR
12.5000 ug | Freq: Every day | INTRAVENOUS | Status: DC
  Filled 2017-11-03: qty 5

## 2017-11-03 MED ORDER — ENOXAPARIN SODIUM 40 MG/0.4ML ~~LOC~~ SOLN: 40.0000 mg | SUBCUTANEOUS | Status: DC

## 2017-11-03 MED ORDER — ACETAMINOPHEN 160 MG/5ML PO SOLN: 650.0000 mg | ORAL | Status: DC | PRN

## 2017-11-03 MED ORDER — ACETAMINOPHEN 650 MG RE SUPP: 650.0000 mg | RECTAL | Status: DC | PRN

## 2017-11-03 MED ORDER — ACETAMINOPHEN 325 MG PO TABS
650.0000 mg | ORAL_TABLET | ORAL | Status: DC | PRN
  Administered 2017-11-05: 650 mg via ORAL
  Filled 2017-11-03: qty 2

## 2017-11-03 MED ORDER — SODIUM CHLORIDE 0.9 % IV SOLN
INTRAVENOUS | Status: DC
  Administered 2017-11-04 – 2017-11-06 (×3): via INTRAVENOUS

## 2017-11-03 MED ORDER — STROKE: EARLY STAGES OF RECOVERY BOOK
Freq: Once | Status: AC
  Administered 2017-11-05: 08:00:00
  Filled 2017-11-03 (×2): qty 1

## 2017-11-03 NOTE — Telephone Encounter (Signed)
noted 

## 2017-11-03 NOTE — ED Notes (Signed)
Kiaraliz Rafuse [phone]  Call with room assignment

## 2017-11-03 NOTE — Telephone Encounter (Signed)
Pt's husband, Shaquisha Wynn, Sr  called that his wife started having weakness yesterday and is unable to get out of bed without falling; he further states that "she was seen by Dr Quay Burow 2 days ago and given a clean bill of health"; Mr Nut also said that yesterday the pt fell and he had to call EMS to help get her up; her pt's husband is requesting an appointment in office but per nurse triage, husband was instructed to take pt to  ED for evaluation; pt verifies understanding and he will contact EMS for transport to hospital; will route this encounter to [place] pool  Reason for Disposition . [1] SEVERE weakness (i.e., unable to walk or barely able to walk, requires support) AND     [2] new onset or worsening  Answer Assessment - Initial Assessment Questions 1. DESCRIPTION: "Describe how you are feeling."     Says she can't do it when she tries to get up 2. SEVERITY: "How bad is it?"  "Can you stand and walk?"   - MILD - Feels weak or tired, but does not interfere with work, school or normal activities   - [place] to stand and walk; weakness interferes with work, school, or normal activities   - SEVERE - Unable to stand or walk     severe 3. ONSET:  "When did the weakness begin?"     11/03/27 4. CAUSE: "What do you think is causing the weakness?"     unsure 5. MEDICINES: "Have you recently started a new medicine or had a change in the amount of a medicine?"     no 6. OTHER SYMPTOMS: "Do you have any other symptoms?" (e.g., chest pain, fever, cough, SOB, vomiting, diarrhea, bleeding)     no 7. PREGNANCY: "Is there any chance you are pregnant?" "When was your last menstrual period?"     no  Protocols used: WEAKNESS (GENERALIZED) AND FATIGUE-A-AH

## 2017-11-03 NOTE — ED Notes (Signed)
ED TO INPATIENT HANDOFF REPORT  Name/Age/Gender Janet Steele 83 y.o. female  Code Status    Code Status Orders  (From admission, onward)        Start     Ordered   11/03/17 2218  Full code  Continuous     11/03/17 2219    Code Status History    Date Active Date Inactive Code Status Order ID Comments User Context   03/05/2014 19:51 03/06/2014 19:38 Full Code [id number]  Belva Crome, MD Inpatient   03/02/2014 21:47 03/05/2014 19:51 DNR [id number]  Lamar Sprinkles, MD Inpatient   07/06/2012 18:16 07/07/2012 17:36 Full Code [id number]  Simonne Come, RN Inpatient   07/05/2012 19:15 07/06/2012 18:16 Full Code [id number]  Monika Salk, MD ED      Home/SNF/Other Skilled nursing facility  Chief Complaint weakness   Level of Care/Admitting Diagnosis ED Disposition    ED Disposition Condition [place]: [facility] [[id number]]  Level of Care: Telemetry [5]  Diagnosis: CVA (cerebral vascular accident) [facility]) [009233]  Admitting Physician: Karmen Bongo [[id number]]  Attending Physician: Karmen Bongo [[id number]]  PT Class (Do Not Modify): Observation [104]  PT Acc Code (Do Not Modify): Observation [10022]       Medical History Past Medical History:  Diagnosis Date  . Anemia   . Anxiety   . Arthritis    "fingers" (05/19/2016  . CAD (coronary artery disease)    a. s/p CABG 1982 b. redo CABG 2003. c. [place] despite med rx 05/2016: successful but complicated PCI of PDA beyond graft site, c/b localized dissection requiring overlapping stent.  . Carotid bruit    a. Duplex 01/2015: patent vessels, 1-39% BICA, f/u PRN recommened.  . Colon polyp    adenomatous  . Colon, diverticulosis   . Degenerative joint disease   . Diastolic dysfunction   . Esophageal stricture   . GERD (gastroesophageal reflux disease)   . Hiatal hernia   . History of blood transfusion    "related to my bypass"  . Hyperlipidemia   . Hypertension   . Hypothyroidism   . Ischemic  cardiomyopathy    a. Cath 05/2016: EF 40% with severe inferior wall HK, suspect hibernating myocardium.  . Myocardial infarction ([place]) 1977; 1982  . Osteoarthrosis, unspecified whether generalized or localized, unspecified site   . Presence of permanent cardiac pacemaker   . PVD (peripheral vascular disease) ([place])   . Type II diabetes mellitus ([place])   . Unspecified hearing loss     Allergies Allergies  Allergen Reactions  . Morphine Hives, Itching and Rash  . Penicillins Itching and Rash    Has patient had a PCN reaction causing immediate rash, facial/tongue/throat swelling, SOB or lightheadedness with hypotension: Yes Has patient had a PCN reaction causing severe rash involving mucus membranes or skin necrosis: No Has patient had a PCN reaction that required hospitalization No Has patient had a PCN reaction occurring within the last 10 years: No If all of the above answers are "NO", then may proceed with Cephalosporin use.   . Prednisone Other (See Comments)    MEMORY LOSS   . Sulfonamide Derivatives Itching and Rash  . Codeine Rash  . Hydrocodone-Acetaminophen Other (See Comments)    unknown  . Meperidine Hcl Itching  . Metoclopramide Hcl Itching and Rash  . Metoprolol Succinate Other (See Comments)    nervous  . Moxifloxacin Itching  . Oxycodone-Aspirin Rash  . Pentazocine Lactate Nausea Only  .  Pioglitazone Other (See Comments)    bloating    IV Location/Drains/Wounds Patient Lines/Drains/Airways Status   Active Line/Drains/Airways    None          Labs/Imaging Results for orders placed or performed during the hospital encounter of 11/03/17 (from the past 48 hour(s))  Urinalysis, Routine w reflex microscopic     Status: Abnormal   Collection Time: 11/03/17  3:35 PM  Result Value Ref Range   Color, Urine YELLOW YELLOW   APPearance CLEAR CLEAR   Specific Gravity, Urine 1.017 1.005 - 1.030   pH 5.0 5.0 - 8.0   Glucose, UA 50 (A) NEGATIVE mg/dL   Hgb urine  dipstick NEGATIVE NEGATIVE   Bilirubin Urine NEGATIVE NEGATIVE   Ketones, ur NEGATIVE NEGATIVE mg/dL   Protein, ur NEGATIVE NEGATIVE mg/dL   Nitrite NEGATIVE NEGATIVE   Leukocytes, UA SMALL (A) NEGATIVE   RBC / HPF 0-5 0 - 5 RBC/hpf   WBC, UA 6-30 0 - 5 WBC/hpf   Bacteria, UA RARE (A) NONE SEEN   Squamous Epithelial / LPF 0-5 (A) NONE SEEN   Non Squamous Epithelial 6-30 (A) NONE SEEN  Comprehensive metabolic panel     Status: Abnormal   Collection Time: 11/03/17  3:49 PM  Result Value Ref Range   Sodium 136 135 - 145 mmol/L   Potassium 3.8 3.5 - 5.1 mmol/L   Chloride 100 (L) 101 - 111 mmol/L   CO2 26 22 - 32 mmol/L   Glucose, Bld 172 (H) 65 - 99 mg/dL   BUN 13 6 - 20 mg/dL   Creatinine, Ser 0.94 0.44 - 1.00 mg/dL   Calcium 9.2 8.9 - 10.3 mg/dL   Total Protein 7.8 6.5 - 8.1 g/dL   Albumin 4.3 3.5 - 5.0 g/dL   AST 24 15 - 41 U/L   ALT 16 14 - 54 U/L   Alkaline Phosphatase 117 38 - 126 U/L   Total Bilirubin 1.2 0.3 - 1.2 mg/dL   GFR calc non Af Amer 53 (L) >60 mL/min   GFR calc Af Amer >60 >60 mL/min    Comment: (NOTE) The eGFR has been calculated using the CKD EPI equation. This calculation has not been validated in all clinical situations. eGFR's persistently <60 mL/min signify possible Chronic Kidney Disease.    Anion gap 10 5 - 15  CBC with Differential/Platelet     Status: None   Collection Time: 11/03/17  3:49 PM  Result Value Ref Range   WBC 7.8 4.0 - 10.5 K/uL   RBC 5.01 3.87 - 5.11 MIL/uL   Hemoglobin 13.9 12.0 - 15.0 g/dL   HCT 40.9 36.0 - 46.0 %   MCV 81.6 78.0 - 100.0 fL   MCH 27.7 26.0 - 34.0 pg   MCHC 34.0 30.0 - 36.0 g/dL   RDW 14.6 11.5 - 15.5 %   Platelets 289 150 - 400 K/uL   Neutrophils Relative % 76 %   Neutro Abs 5.8 1.7 - 7.7 K/uL   Lymphocytes Relative 16 %   Lymphs Abs 1.2 0.7 - 4.0 K/uL   Monocytes Relative 7 %   Monocytes Absolute 0.6 0.1 - 1.0 K/uL   Eosinophils Relative 1 %   Eosinophils Absolute 0.1 0.0 - 0.7 K/uL   Basophils Relative  0 %   Basophils Absolute 0.0 0.0 - 0.1 K/uL  Troponin I     Status: None   Collection Time: 11/03/17  3:49 PM  Result Value Ref Range   Troponin I <  0.03 <0.03 ng/mL  TSH     Status: Abnormal   Collection Time: 11/03/17  3:49 PM  Result Value Ref Range   TSH 18.878 (H) 0.350 - 4.500 uIU/mL    Comment: Performed by a 3rd Generation assay with a functional sensitivity of <=0.01 uIU/mL.  CK     Status: None   Collection Time: 11/03/17  3:49 PM  Result Value Ref Range   Total CK 209 38 - 234 U/L   Dg Chest 2 View  Result Date: 11/03/2017 CLINICAL DATA:  83 y/o  F; 1 day weakness and increased urination. EXAM: CHEST  2 VIEW COMPARISON:  03/11/2017 chest radiograph FINDINGS: Stable normal cardiac silhouette. 2 lead pacemaker. Post median sternotomy with wires in alignment. Aortic atherosclerosis with calcification. Stable chronic bronchitic changes. No focal consolidation. No pleural effusion or pneumothorax. No acute osseous abnormality is evident. IMPRESSION: COPD. No acute pulmonary process identified. Stable cardiac silhouette. Electronically Signed   By: Kristine Garbe M.D.   On: 11/03/2017 16:32   Ct Head Wo Contrast  Result Date: 11/03/2017 CLINICAL DATA:  Subacute neuro deficits. Weakness and foul smelling urine. EXAM: CT HEAD WITHOUT CONTRAST TECHNIQUE: Contiguous axial images were obtained from the base of the skull through the vertex without intravenous contrast. COMPARISON:  11/01/2017 FINDINGS: Brain: There is a wedge-shaped low-density in the left putamen extending to the caudate on coronal reformats. This is somewhat indistinct and and is age-indeterminate. More well-defined low-density lacunar infarct in the left centrum semiovale. No hemorrhage or hydrocephalus. No masslike finding. Vascular: Arterial calcification.  No hyperdense vessel. Skull: No acute or aggressive finding. Sinuses/Orbits: Bilateral cataract resection. These results were called by telephone at the time  of interpretation on 11/03/2017 at 7:21 pm to Dr. Lacretia Leigh , who verbally acknowledged these results. IMPRESSION: 1. Age-indeterminate perforator infarct in the left basal ganglia. 2. Remote lacunar infarct in the left centrum semiovale. Electronically Signed   By: Monte Fantasia M.D.   On: 11/03/2017 19:22    Pending Labs Unresulted Labs (From admission, onward)   Start     Ordered   11/10/17 0500  Creatinine, serum  (enoxaparin (LOVENOX)    CrCl >/= 30 ml/min)  Weekly,   R    Comments:  while on enoxaparin therapy    11/03/17 2219   11/04/17 0500  Lipid panel  Tomorrow morning,   R    Comments:  Fasting    11/03/17 2219   11/03/17 1510  Urine Culture  STAT,   STAT     11/03/17 1509   Signed and Held  Creatinine, serum  (enoxaparin (LOVENOX)    CrCl < 30 ml/min)  Weekly,   R    Comments:  while on enoxaparin therapy.    Signed and Held      Vitals/Pain Today's Vitals   11/03/17 1509 11/03/17 1716 11/03/17 1923 11/03/17 2158  BP:  (!) 139/113 (!) 130/106 (!) 149/90  Pulse:  77 71 72  Resp:  '20 20 20  ' Temp:      TempSrc:      SpO2:  98% 97% 95%  PainSc: 0-No pain       Isolation Precautions No active isolations  Medications Medications  levothyroxine (SYNTHROID, LEVOTHROID) injection 12.5 mcg (not administered)  enoxaparin (LOVENOX) injection 40 mg (not administered)   stroke: mapping our early stages of recovery book (not administered)  0.9 %  sodium chloride infusion (not administered)  acetaminophen (TYLENOL) tablet 650 mg (not administered)    Or  acetaminophen (TYLENOL) solution 650 mg (not administered)    Or  acetaminophen (TYLENOL) suppository 650 mg (not administered)  senna-docusate (Senokot-S) tablet 1 tablet (not administered)    Mobility walks with device

## 2017-11-03 NOTE — Consult Note (Signed)
Neurology Consultation Reason for Consult: leg weakness Referring Physician: Zenia Resides, a  CC: Right-sided weakness  History is obtained from: Patient  HPI: Janet Steele is a 83 y.o. female who states that she has been having right-sided weakness for the past 2 days.  She states that she has noted that her right arm is not coordinated, and she has been having trouble standing on her right leg.  For that reason she presented to the emergency room where a CT was performed which shows a age-indeterminate but likely subacute infarct in the left basal ganglia.  While walking the room, she asked me "why my under scrutiny!?"  When I tell her that she is being evaluated for leg weakness she responds with "well, only one is weak!"  LKW: 11/27 tpa given?: no, outside of window    ROS: A 14 point ROS was performed and is negative except as noted in the HPI.   Past Medical History:  Diagnosis Date  . Anemia   . Anxiety   . Arthritis    "fingers" (05/19/2016  . CAD (coronary artery disease)    a. s/p CABG 1982 b. redo CABG 2003. c. [place] despite med rx 05/2016: successful but complicated PCI of PDA beyond graft site, c/b localized dissection requiring overlapping stent.  . Carotid bruit    a. Duplex 01/2015: patent vessels, 1-39% BICA, f/u PRN recommened.  . Colon polyp    adenomatous  . Colon, diverticulosis   . Degenerative joint disease   . Diastolic dysfunction   . Esophageal stricture   . GERD (gastroesophageal reflux disease)   . Hiatal hernia   . History of blood transfusion    "related to my bypass"  . Hyperlipidemia   . Hypertension   . Hypothyroidism   . Ischemic cardiomyopathy    a. Cath 05/2016: EF 40% with severe inferior wall HK, suspect hibernating myocardium.  . Myocardial infarction ([place]) 1977; 1982  . Osteoarthrosis, unspecified whether generalized or localized, unspecified site   . Presence of permanent cardiac pacemaker   . PVD (peripheral vascular disease) ([place])   .  Type II diabetes mellitus ([place])   . Unspecified hearing loss      Family History  Problem Relation Age of Onset  . Heart disease Sister   . Heart attack Sister   . Colon cancer Neg Hx   . Breast cancer Neg Hx   . Celiac disease Neg Hx   . Cirrhosis Neg Hx   . Clotting disorder Neg Hx   . Colitis Neg Hx   . Colon polyps Neg Hx   . Crohn's disease Neg Hx   . Cystic fibrosis Neg Hx   . Diabetes Neg Hx   . Esophageal cancer Neg Hx   . Hemochromatosis Neg Hx   . Inflammatory bowel disease Neg Hx   . Irritable bowel syndrome Neg Hx   . Kidney disease Neg Hx   . Liver cancer Neg Hx   . Liver disease Neg Hx   . Ovarian cancer Neg Hx   . Pancreatic cancer Neg Hx   . Prostate cancer Neg Hx   . Rectal cancer Neg Hx   . Stomach cancer Neg Hx   . Ulcerative colitis Neg Hx   . Uterine cancer Neg Hx   . Wilson's disease Neg Hx      Social History:  reports that she quit smoking about 41 years ago. Her smoking use included cigarettes. She has a 15.00 pack-year smoking history. she has  never used smokeless tobacco. She reports that she does not drink alcohol or use drugs.   Exam: Current vital signs: BP (!) 149/90 (BP Location: Right Arm)   Pulse 72   Temp 97.8 F (36.6 C) (Oral)   Resp 20   SpO2 95%  Vital signs in last 24 hours: Temp:  [97.8 F (36.6 C)] 97.8 F (36.6 C) (11/29 1454) Pulse Rate:  [64-77] 72 (11/29 2158) Resp:  [16-20] 20 (11/29 2158) BP: (130-149)/(66-113) 149/90 (11/29 2158) SpO2:  [93 %-100 %] 95 % (11/29 2158)   Physical Exam  Constitutional: Appears well-developed and well-nourished.  Psych: Affect appropriate to situation Eyes: No scleral injection HENT: No OP obstrucion Head: Normocephalic.  Cardiovascular: Normal rate and regular rhythm.  Respiratory: Effort normal and breath sounds normal to anterior ascultation GI: Soft.  No distension. There is no tenderness.  Skin: WDI  Neuro: Mental Status: Patient is awake, alert, oriented to  person,  Place, not oriented to month or year  patient is able to give a clear and coherent history. No signs of aphasia or neglect She appears to have mild psychomotor agitation. Cranial Nerves: II: Visual Fields are full. Pupils are equal, round, and reactive to light.   III,IV, VI: EOMI without ptosis or diploplia.  V: Facial sensation is symmetric to temperature VII: Facial movement is symmetric.  VIII: hearing is intact to voice X: Uvula elevates symmetrically XI: Shoulder shrug is symmetric. XII: tongue is midline without atrophy or fasciculations.  Motor: Tone is normal. Bulk is normal. 4/5 weakness of the right arm and leg with motor coordination out of proportion to weakness. Sensory: Sensation is symmetric to light touch and temperature in the arms, decreased in the right leg Deep Tendon Reflexes: 3+ and symmetric in the biceps and patellae with spread at the patella Cerebellar: She has ataxia of both the right arm and leg.      I have reviewed labs in epic and the results pertinent to this consultation are: CMP-unremarkable CBC-normal, TSH-elevated  I have reviewed the images obtained: CT head-subacute appearing infarct in the left basal ganglia  Impression: 83 year old female with 2 days of right-sided weakness and ischemic infarct on CT.  She will need to be admitted for therapy and secondary risk factor modification.  She is hyperreflexic bilateral, however, I would favor getting an MRI of her cervical spine as well.  Recommendations: 1. HgbA1c, fasting lipid panel 2. MRI, MRA  of the brain without contrast 3. Frequent neuro checks 4. Echocardiogram 5. Carotid dopplers 6. Prophylactic therapy-Antiplatelet med: Aspirin - dose 325mg  PO or 300mg  PR 7. Risk factor modification 8. Telemetry monitoring 9. PT consult, OT consult, Speech consult 10.  MRI cervical spine 11. please page stroke NP  Or  PA  Or MD  from 8am -4 pm as this patient will be followed by the  stroke team at this point.   You can look them up on [email or web address]      Roland Rack, MD [facility] [phone]  If 7pm- 7am, please page neurology on call as listed in [place].

## 2017-11-03 NOTE — ED Triage Notes (Signed)
Per EMS, pt is coming from [facility] facility due to complaints of weakness x1 and increased urination with a foul smell. Pt has a hx of CABG, diabetes, stemi, dementia, and has a pacemaker. Pt usually ambulates with a walker and is AO x3.

## 2017-11-03 NOTE — ED Notes (Signed)
Bed: WA10 Expected date:  Expected time:  Means of arrival:  Comments: EMS-weakness 

## 2017-11-03 NOTE — ED Notes (Signed)
Dr. Leonel Ramsay came to desk and stated that room 10 will no longer be staying at [facility] and he will be admitting her to [facility]. Secretary to notify bed placement.

## 2017-11-03 NOTE — ED Notes (Signed)
Assigned @1919  room 1523 call consult @2039 

## 2017-11-03 NOTE — H&P (Signed)
History and Physical    Janet Steele VPX:[id number] DOB: 05-18-1929 DOA: 11/03/2017  PCP: Binnie Rail, MD Consultants:  "Lord have mercy, I've got tons of them" Patient coming from: [place]; NOK: Husband  Chief Complaint: weakness  HPI: Janet Steele is a 83 y.o. female with medical history significant of DM, PVD, pacemaker placement, CAD, hypothyroidism, HTN, HLD, GERD, and diastolic dysfunction presenting with LE weakness.  She reports that she "had an appointment with Dr. Opal Sidles, what's his name? This is just lunacy."  She reports that she just wants someone to give her a ride back to [place].  She was agitated and uncooperative at the time of my evaluation.  HPI per Dr. Zenia Resides: 83 year old female presents with 1 day history of bilateral lower extremity weakness worse with standing. Saw her physician 2 days ago for routine checkup and that visit was reviewed and patient was noted to have had high cholesterol and her medications were changed but that prescription has not been filled yet. Since that time when she tries to walk her legs become weak and she has to fall to the ground. No history of trauma from this. Denies any back pain. No bowel or bladder dysfunction. No headaches, confusion. No recent fever or chills. Discomfort. No upper extremity weakness. No neck discomfort. History of similar symptoms in the past without diagnosis. Does note some polyuria. Denies any muscle aches. No treatment use prior to arrival.  ED Course: Can't stand.  H/o hypothyroidism, TSH 18.  Legs so weak, cannot support weight.  H/o TSH 40 in 7/18, normalized and now going back up.  Why can't she walk?  Neurology consulted, CT and exam concerning for CVA; requests transfer to [facility].  Review of Systems: Unable to effectively obtain from the patient  PMH, PSH, FH, SH reviewed in Epic  Past Medical History:  Diagnosis Date  . Anemia   . Anxiety   . Arthritis    "fingers" (05/19/2016  . CAD (coronary  artery disease)    a. s/p CABG 1982 b. redo CABG 2003. c. [place] despite med rx 05/2016: successful but complicated PCI of PDA beyond graft site, c/b localized dissection requiring overlapping stent.  . Carotid bruit    a. Duplex 01/2015: patent vessels, 1-39% BICA, f/u PRN recommened.  . Colon polyp    adenomatous  . Colon, diverticulosis   . Degenerative joint disease   . Diastolic dysfunction   . Esophageal stricture   . GERD (gastroesophageal reflux disease)   . Hiatal hernia   . History of blood transfusion    "related to my bypass"  . Hyperlipidemia   . Hypertension   . Hypothyroidism   . Ischemic cardiomyopathy    a. Cath 05/2016: EF 40% with severe inferior wall HK, suspect hibernating myocardium.  . Myocardial infarction ([place]) 1977; 1982  . Osteoarthrosis, unspecified whether generalized or localized, unspecified site   . Presence of permanent cardiac pacemaker   . PVD (peripheral vascular disease) ([place])   . Type II diabetes mellitus ([place])   . Unspecified hearing loss     Past Surgical History:  Procedure Laterality Date  . ABDOMINAL HYSTERECTOMY    . APPENDECTOMY    . BACK SURGERY    . CARDIAC CATHETERIZATION N/A 05/19/2016   Procedure: Left Heart Cath and Cors/Grafts Angiography;  Surgeon: Belva Crome, MD;  Location: [facility];  Service: Cardiovascular;  Laterality: N/A;  . CARDIAC CATHETERIZATION N/A 05/19/2016   Procedure: Coronary Stent Intervention;  Surgeon:  Belva Crome, MD;  Location: [facility];  Service: Cardiovascular;  Laterality: N/A;  . CATARACT EXTRACTION W/ INTRAOCULAR LENS  IMPLANT, BILATERAL Bilateral   . CHOLECYSTECTOMY OPEN    . COLONOSCOPY    . CORONARY ANGIOPLASTY WITH STENT PLACEMENT  05/19/2016   "2 stents?"  . CORONARY ARTERY BYPASS GRAFT  1982; 2003   x4(1982),02-2002 CABG X2  . ESOPHAGOGASTRODUODENOSCOPY (EGD) WITH ESOPHAGEAL DILATION  "2-3 times"  . INSERT / REPLACE / REMOVE PACEMAKER    . LEFT HEART CATHETERIZATION WITH  CORONARY/GRAFT ANGIOGRAM N/A 03/05/2014   Procedure: LEFT HEART CATHETERIZATION WITH Beatrix Fetters;  Surgeon: Sinclair Grooms, MD;  Location: [facility];  Service: Cardiovascular;  Laterality: N/A;  . LUMBAR [place] SURGERY  01-2000  . OOPHORECTOMY Bilateral   . PERMANENT PACEMAKER INSERTION N/A 07/06/2012   MDT Adapta L implanted by Dr Rayann Heman for symptomatic bradycardia  . TONSILLECTOMY  1930s    Social History   Socioeconomic History  . Marital status: Married    Spouse name: [name]  . Number of children: 3  . Years of education: 1  . Highest education level: Not on file  Social Needs  . Financial resource strain: Not on file  . Food insecurity - worry: Not on file  . Food insecurity - inability: Not on file  . Transportation needs - medical: Not on file  . Transportation needs - non-medical: Not on file  Occupational History  . Occupation: Retired     Fish farm manager: RETIRED  Tobacco Use  . Smoking status: Former Smoker    Packs/day: 0.50    Years: 30.00    Pack years: 15.00    Types: Cigarettes    Last attempt to quit: 07/05/1976    Years since quitting: 41.3  . Smokeless tobacco: Never Used  Substance and Sexual Activity  . Alcohol use: No    Alcohol/week: 1.8 oz    Types: 3 Glasses of wine per week    Frequency: Never    Comment: "not anymore, I haven't in I don't know years"  . Drug use: No  . Sexual activity: No  Other Topics Concern  . Not on file  Social History Narrative   Patient is married [name]) and lives at home with her husband.   Patient has three adult children.   Patient is retired.   Patient has a college education.   Patient is right-handed.   Patient does not drink any caffeine.    Allergies  Allergen Reactions  . Morphine Hives, Itching and Rash  . Penicillins Itching and Rash    Has patient had a PCN reaction causing immediate rash, facial/tongue/throat swelling, SOB or lightheadedness with hypotension: Yes Has patient had a PCN  reaction causing severe rash involving mucus membranes or skin necrosis: No Has patient had a PCN reaction that required hospitalization No Has patient had a PCN reaction occurring within the last 10 years: No If all of the above answers are "NO", then may proceed with Cephalosporin use.   . Prednisone Other (See Comments)    MEMORY LOSS   . Sulfonamide Derivatives Itching and Rash  . Codeine Rash  . Hydrocodone-Acetaminophen Other (See Comments)    unknown  . Meperidine Hcl Itching  . Metoclopramide Hcl Itching and Rash  . Metoprolol Succinate Other (See Comments)    nervous  . Moxifloxacin Itching  . Oxycodone-Aspirin Rash  . Pentazocine Lactate Nausea Only  . Pioglitazone Other (See Comments)    bloating  Family History  Problem Relation Age of Onset  . Heart disease Sister   . Heart attack Sister   . Colon cancer Neg Hx   . Breast cancer Neg Hx   . Celiac disease Neg Hx   . Cirrhosis Neg Hx   . Clotting disorder Neg Hx   . Colitis Neg Hx   . Colon polyps Neg Hx   . Crohn's disease Neg Hx   . Cystic fibrosis Neg Hx   . Diabetes Neg Hx   . Esophageal cancer Neg Hx   . Hemochromatosis Neg Hx   . Inflammatory bowel disease Neg Hx   . Irritable bowel syndrome Neg Hx   . Kidney disease Neg Hx   . Liver cancer Neg Hx   . Liver disease Neg Hx   . Ovarian cancer Neg Hx   . Pancreatic cancer Neg Hx   . Prostate cancer Neg Hx   . Rectal cancer Neg Hx   . Stomach cancer Neg Hx   . Ulcerative colitis Neg Hx   . Uterine cancer Neg Hx   . Wilson's disease Neg Hx     Prior to Admission medications   Medication Sig Start Date End Date Taking? Authorizing Provider  acetaminophen (TYLENOL) 500 MG tablet Take 2 tablets (1,000 mg total) by mouth every 6 (six) hours as needed. 03/21/17   Nche, Charlene Brooke, NP  amLODipine (NORVASC) 5 MG tablet take 1 tablet by mouth once daily 07/11/17   Sherren Mocha, MD  clopidogrel (PLAVIX) 75 MG tablet Take 1 tablet (75 mg total) daily by  mouth. 10/24/17   Sherren Mocha, MD  DULoxetine (CYMBALTA) 60 MG capsule take 1 capsule by mouth once daily 06/01/17   Binnie Rail, MD  gabapentin (NEURONTIN) 300 MG capsule Take 1 capsule (300 mg total) by mouth at bedtime. 06/10/17   Binnie Rail, MD  glimepiride (AMARYL) 2 MG tablet Take 2 mg by mouth daily before breakfast.  04/02/11   Hendricks Limes, MD  levothyroxine (SYNTHROID, LEVOTHROID) 150 MCG tablet Take 1 tablet (150 mcg total) by mouth daily. 11/01/17   Binnie Rail, MD  losartan (COZAAR) 50 MG tablet Take 1 tablet (50 mg total) by mouth daily. 02/24/15   Hendricks Limes, MD  metFORMIN (GLUCOPHAGE) 1000 MG tablet Take 1 tablet (1,000 mg total) by mouth 2 (two) times daily with a meal. 12/25/16   Burns, Claudina Lick, MD  metoprolol tartrate (LOPRESSOR) 25 MG tablet take 1 tablet by mouth twice a day 09/26/17   Sherren Mocha, MD  mupirocin ointment Drue Stager) 2 %  12/03/16   [provider]  nitroGLYCERIN (NITROSTAT) 0.4 MG SL tablet Place 1 tablet (0.4 mg total) under the tongue every 5 (five) minutes as needed. For chest pain 05/18/16   Sherren Mocha, MD  pantoprazole (PROTONIX) 40 MG tablet take 1 tablet by mouth twice a day 06/01/17   Irene Shipper, MD  RA ASPIRIN EC 81 MG EC tablet take 1 tablet by mouth once daily . OVERDUE FOR FOLLOW UP. PLEASE CALL AND SCHEDULE [phone]. 08/02/17   Sherren Mocha, MD  rosuvastatin (CRESTOR) 40 MG tablet Take 1 tablet (40 mg total) by mouth daily. 11/02/17   Binnie Rail, MD  sitaGLIPtin (JANUVIA) 50 MG tablet Take 1 tablet (50 mg total) by mouth daily. 11/01/17   Binnie Rail, MD  traMADol (ULTRAM) 50 MG tablet take 1 tablet by mouth every 6 hours if needed for MODERATE PAIN 11/01/17  Binnie Rail, MD  traZODone (DESYREL) 50 MG tablet Take 2 tablets (100 mg total) by mouth at bedtime. 06/10/17   Binnie Rail, MD    Physical Exam: Vitals:   11/03/17 1508 11/03/17 1716 11/03/17 1923 11/03/17 2158  BP:  (!) 139/113 (!)  130/106 (!) 149/90  Pulse:  77 71 72  Resp:  20 20 20   Temp:      TempSrc:      SpO2: 98% 98% 97% 95%     General: She is agitated about being in the ER; has facial twitching Eyes:  PERRL, EOMI, normal lids, iris ENT:  grossly normal hearing, lips & tongue, mmm Neck:  no LAD, masses or thyromegaly Cardiovascular:  RRR, no m/r/g. No LE edema.  Respiratory:   CTA bilaterally with no wheezes/rales/rhonchi.  Normal respiratory effort. Abdomen:  soft, NT, ND, NABS Skin:  no rash or induration seen on limited exam Musculoskeletal: no obvious issues but patient uncooperative with exam Psychiatric: agitated, appears to have at least moderate baseline dementia, speech fluent but inappropriate, AOx1 Neurologic: unable to assess    Radiological Exams on Admission: Dg Chest 2 View  Result Date: 11/03/2017 CLINICAL DATA:  83 y/o  F; 1 day weakness and increased urination. EXAM: CHEST  2 VIEW COMPARISON:  03/11/2017 chest radiograph FINDINGS: Stable normal cardiac silhouette. 2 lead pacemaker. Post median sternotomy with wires in alignment. Aortic atherosclerosis with calcification. Stable chronic bronchitic changes. No focal consolidation. No pleural effusion or pneumothorax. No acute osseous abnormality is evident. IMPRESSION: COPD. No acute pulmonary process identified. Stable cardiac silhouette. Electronically Signed   By: Kristine Garbe M.D.   On: 11/03/2017 16:32   Ct Head Wo Contrast  Result Date: 11/03/2017 CLINICAL DATA:  Subacute neuro deficits. Weakness and foul smelling urine. EXAM: CT HEAD WITHOUT CONTRAST TECHNIQUE: Contiguous axial images were obtained from the base of the skull through the vertex without intravenous contrast. COMPARISON:  11/01/2017 FINDINGS: Brain: There is a wedge-shaped low-density in the left putamen extending to the caudate on coronal reformats. This is somewhat indistinct and and is age-indeterminate. More well-defined low-density lacunar infarct in  the left centrum semiovale. No hemorrhage or hydrocephalus. No masslike finding. Vascular: Arterial calcification.  No hyperdense vessel. Skull: No acute or aggressive finding. Sinuses/Orbits: Bilateral cataract resection. These results were called by telephone at the time of interpretation on 11/03/2017 at 7:21 pm to Dr. Lacretia Leigh , who verbally acknowledged these results. IMPRESSION: 1. Age-indeterminate perforator infarct in the left basal ganglia. 2. Remote lacunar infarct in the left centrum semiovale. Electronically Signed   By: Monte Fantasia M.D.   On: 11/03/2017 19:22    EKG: Independently reviewed.  NSR with rate 63; nonspecific ST changes with no evidence of acute ischemia, NSCSLT   Labs on Admission: I have personally reviewed the available labs and imaging studies at the time of the admission.  Pertinent labs:   Glucose 172; A1c 7.1 on 11/27 GFR 53 CK 209 Troponin <0.03 Normal CBC TSH 18.878; 12 on 11/27; 0.82 on 8/21; 41.49 on 7/30 UA: 50 glucose, small LE, rare bacteria, 6-30 WBC  Assessment/Plan Principal Problem:   CVA (cerebral vascular accident) ([place]) Active Problems:   Hypothyroidism   Diabetes ([place])   HYPERLIPIDEMIA   Essential hypertension   Abnormal involuntary movement   MCI (mild cognitive impairment) with memory loss   Weakness    CVA -Patient presenting with unspecified weakness but CT appears to show CVA, age unspecified -Neurology, Dr. Leonel Ramsay, has seen  the patient (she was more cooperative with him) and is concerned about an acute stroke -Will place observation status for CVA/TIA evaluation at [facility] -Telemetry monitoring -MRI/MRA -Carotid dopplers -Echo -Risk stratification with FLP -Continue ASA and Plavix daily -PT/OT/ST/Nutrition Consults -SW consult for placement  HTN -Allow permissive HTN -Treat BP only if >220/120, and then with goal of 15% reduction -Hold Lopressor, Norvasc, and Cozaar and plan to restart in 48-72  hours  HLD -Check FLP -She is already on Crestor  DM -Recent A1c was 7.1 -hold Amaryl and Januvia -Cover with sensitive-scale SSI  Dementia -Patient is not taking any memory medications -By exam today, she appears to have at least moderate dementia -While there may be some acute component to this, it appears to be chronic in nature  Hypothyroidism -She has had brittle control since at least July -Current TSH is elevated -She was given IV Synthroid today -Will resume PO Synthroid tomorrow  Involuntary movements -Appears to be chronic based on prior diagnoses in the chart  DVT prophylaxis:  Lovenox  Code Status: Full  Family Communication: None present Disposition Plan: To be determined Consults called: Neurology; PT/OT/ST/Nutrition/SW Admission status: It is my clinical opinion that referral for OBSERVATION is reasonable and necessary in this patient based on the above information provided. The aforementioned taken together are felt to place the patient at high risk for further clinical deterioration. However it is anticipated that the patient may be medically stable for discharge from the hospital within 24 to 48 hours.    Karmen Bongo MD [facility]  If note is complete, please contact covering daytime or nighttime physician. [email or web address] Password [facility]  11/03/2017, 10:42 PM

## 2017-11-03 NOTE — ED Provider Notes (Addendum)
[facility] DEPT Provider Note   CSN: [id number] Arrival date & time: 11/03/17  1434     History   Chief Complaint Chief Complaint  Patient presents with  . Weakness    HPI Janet Steele is a 83 y.o. female.  83 year old female presents with 1 day history of bilateral lower extremity weakness worse with standing.  Saw her physician 2 days ago for routine checkup and that visit was reviewed and patient was noted to have had high cholesterol and her medications were changed but that prescription has not been filled yet.  Since that time when she tries to walk her legs become weak and she has to fall to the ground.  No history of trauma from this.  Denies any back pain.  No bowel or bladder dysfunction.  No headaches, confusion.  No recent fever or chills.  Discomfort.  No upper extremity weakness.  No neck discomfort.  History of similar symptoms in the past without diagnosis.  Does note some polyuria.  Denies any muscle aches.  No treatment use prior to arrival.      Past Medical History:  Diagnosis Date  . Anemia   . Anxiety   . Arthritis    "fingers" (05/19/2016  . CAD (coronary artery disease)    a. s/p CABG 1982 b. redo CABG 2003. c. [place] despite med rx 05/2016: successful but complicated PCI of PDA beyond graft site, c/b localized dissection requiring overlapping stent.  . Carotid bruit    a. Duplex 01/2015: patent vessels, 1-39% BICA, f/u PRN recommened.  . Colon polyp    adenomatous  . Colon, diverticulosis   . Degenerative joint disease   . Diastolic dysfunction   . Esophageal stricture   . GERD (gastroesophageal reflux disease)   . Hiatal hernia   . History of blood transfusion    "related to my bypass"  . Hyperlipidemia   . Hypertension   . Hypothyroidism   . Ischemic cardiomyopathy    a. Cath 05/2016: EF 40% with severe inferior wall HK, suspect hibernating myocardium.  . Myocardial infarction ([place]) 1977; 1982  . Osteoarthrosis,  unspecified whether generalized or localized, unspecified site   . Presence of permanent cardiac pacemaker   . PVD (peripheral vascular disease) ([place])   . Type II diabetes mellitus ([place])   . Unspecified hearing loss     Patient Active Problem List   Diagnosis Date Noted  . Viral upper respiratory tract infection 11/01/2017  . Chronic back pain 07/04/2017  . Rib pain on left side 04/20/2017  . PAD (peripheral artery disease) ([place]) 01/17/2017  . Overactive bladder 12/17/2016  . Anemia 05/20/2016  . Coronary artery disease involving coronary bypass graft of native heart without angina pectoris 05/19/2016  . Numbness and tingling of foot 12/31/2015  . MCI (mild cognitive impairment) with memory loss 11/18/2015  . Facial tic 05/07/2015  . Facial nerve spasm 05/07/2015  . Sick sinus syndrome ([place]) 02/26/2015  . Cervical dystonia 12/04/2014  . Chronic motor tic 09/12/2014  . Nonspecific abnormal electrocardiogram (ECG) (EKG)   . Unstable angina ([place]) 03/02/2014  . Pacemaker-Medtronic 07/07/2012  . Bradycardia 07/05/2012  . GERD (gastroesophageal reflux disease) 07/05/2012  . Near syncope 12/23/2011  . Abnormal involuntary movements(781.0) 12/17/2009  . Disturbance in sleep behavior 12/11/2009  . CAD, ARTERY BYPASS GRAFT 09/24/2009  . CAROTID BRUIT 09/24/2009  . Diabetes ([place]) 01/30/2009  . DYSPHAGIA 06/19/2008  . Anxiety 02/14/2008  . EROSIVE ESOPHAGITIS 02/14/2008  . CONSTIPATION, CHRONIC  02/14/2008  . DEGENERATIVE JOINT DISEASE 02/14/2008  . HEARING LOSS 01/04/2008  . PVD 01/04/2008  . Hypothyroidism 09/25/2007  . HYPERLIPIDEMIA 09/25/2007  . Essential hypertension 09/07/2007  . Diabetic polyneuropathy ([place]) 03/16/2007  . Stricture and stenosis of esophagus 03/16/2007  . HIATAL HERNIA 02/16/2007  . HEMORRHOIDS 12/31/1997  . DIVERTICULOSIS, COLON 12/31/1997    Past Surgical History:  Procedure Laterality Date  . ABDOMINAL HYSTERECTOMY    . APPENDECTOMY    . BACK  SURGERY    . CARDIAC CATHETERIZATION N/A 05/19/2016   Procedure: Left Heart Cath and Cors/Grafts Angiography;  Surgeon: Belva Crome, MD;  Location: [facility];  Service: Cardiovascular;  Laterality: N/A;  . CARDIAC CATHETERIZATION N/A 05/19/2016   Procedure: Coronary Stent Intervention;  Surgeon: Belva Crome, MD;  Location: [facility];  Service: Cardiovascular;  Laterality: N/A;  . CATARACT EXTRACTION W/ INTRAOCULAR LENS  IMPLANT, BILATERAL Bilateral   . CHOLECYSTECTOMY OPEN    . COLONOSCOPY    . CORONARY ANGIOPLASTY WITH STENT PLACEMENT  05/19/2016   "2 stents?"  . CORONARY ARTERY BYPASS GRAFT  1982; 2003   x4(1982),02-2002 CABG X2  . ESOPHAGOGASTRODUODENOSCOPY (EGD) WITH ESOPHAGEAL DILATION  "2-3 times"  . INSERT / REPLACE / REMOVE PACEMAKER    . LEFT HEART CATHETERIZATION WITH CORONARY/GRAFT ANGIOGRAM N/A 03/05/2014   Procedure: LEFT HEART CATHETERIZATION WITH Beatrix Fetters;  Surgeon: Sinclair Grooms, MD;  Location: [facility] CATH LAB;  Service: Cardiovascular;  Laterality: N/A;  . LUMBAR [place] SURGERY  01-2000  . OOPHORECTOMY Bilateral   . PERMANENT PACEMAKER INSERTION N/A 07/06/2012   MDT Adapta L implanted by Dr Rayann Heman for symptomatic bradycardia  . TONSILLECTOMY  1930s    OB History    No data available       Home Medications    Prior to Admission medications   Medication Sig Start Date End Date Taking? Authorizing Provider  acetaminophen (TYLENOL) 500 MG tablet Take 2 tablets (1,000 mg total) by mouth every 6 (six) hours as needed. 03/21/17   Nche, Charlene Brooke, NP  amLODipine (NORVASC) 5 MG tablet take 1 tablet by mouth once daily 07/11/17   Sherren Mocha, MD  clopidogrel (PLAVIX) 75 MG tablet Take 1 tablet (75 mg total) daily by mouth. 10/24/17   Sherren Mocha, MD  DULoxetine (CYMBALTA) 60 MG capsule take 1 capsule by mouth once daily 06/01/17   Binnie Rail, MD  gabapentin (NEURONTIN) 300 MG capsule Take 1 capsule (300 mg total) by mouth at bedtime.  06/10/17   Binnie Rail, MD  glimepiride (AMARYL) 2 MG tablet Take 2 mg by mouth daily before breakfast.  04/02/11   Hendricks Limes, MD  levothyroxine (SYNTHROID, LEVOTHROID) 150 MCG tablet Take 1 tablet (150 mcg total) by mouth daily. 11/01/17   Binnie Rail, MD  losartan (COZAAR) 50 MG tablet Take 1 tablet (50 mg total) by mouth daily. 02/24/15   Hendricks Limes, MD  metFORMIN (GLUCOPHAGE) 1000 MG tablet Take 1 tablet (1,000 mg total) by mouth 2 (two) times daily with a meal. 12/25/16   Burns, Claudina Lick, MD  metoprolol tartrate (LOPRESSOR) 25 MG tablet take 1 tablet by mouth twice a day 09/26/17   Sherren Mocha, MD  mupirocin ointment Drue Stager) 2 %  12/03/16   [provider]  nitroGLYCERIN (NITROSTAT) 0.4 MG SL tablet Place 1 tablet (0.4 mg total) under the tongue every 5 (five) minutes as needed. For chest pain 05/18/16   Sherren Mocha, MD  pantoprazole (PROTONIX) 40 MG tablet take 1 tablet by mouth twice a day 06/01/17   Irene Shipper, MD  RA ASPIRIN EC 81 MG EC tablet take 1 tablet by mouth once daily . OVERDUE FOR FOLLOW UP. PLEASE CALL AND SCHEDULE [phone]. 08/02/17   Sherren Mocha, MD  rosuvastatin (CRESTOR) 40 MG tablet Take 1 tablet (40 mg total) by mouth daily. 11/02/17   Binnie Rail, MD  sitaGLIPtin (JANUVIA) 50 MG tablet Take 1 tablet (50 mg total) by mouth daily. 11/01/17   Binnie Rail, MD  traMADol (ULTRAM) 50 MG tablet take 1 tablet by mouth every 6 hours if needed for MODERATE PAIN 11/01/17   Binnie Rail, MD  traZODone (DESYREL) 50 MG tablet Take 2 tablets (100 mg total) by mouth at bedtime. 06/10/17   Binnie Rail, MD    Family History Family History  Problem Relation Age of Onset  . Heart disease Sister   . Heart attack Sister   . Colon cancer Neg Hx   . Breast cancer Neg Hx   . Celiac disease Neg Hx   . Cirrhosis Neg Hx   . Clotting disorder Neg Hx   . Colitis Neg Hx   . Colon polyps Neg Hx   . Crohn's disease Neg Hx   . Cystic fibrosis  Neg Hx   . Diabetes Neg Hx   . Esophageal cancer Neg Hx   . Hemochromatosis Neg Hx   . Inflammatory bowel disease Neg Hx   . Irritable bowel syndrome Neg Hx   . Kidney disease Neg Hx   . Liver cancer Neg Hx   . Liver disease Neg Hx   . Ovarian cancer Neg Hx   . Pancreatic cancer Neg Hx   . Prostate cancer Neg Hx   . Rectal cancer Neg Hx   . Stomach cancer Neg Hx   . Ulcerative colitis Neg Hx   . Uterine cancer Neg Hx   . Wilson's disease Neg Hx     Social History Social History   Tobacco Use  . Smoking status: Former Smoker    Packs/day: 0.50    Years: 30.00    Pack years: 15.00    Types: Cigarettes    Last attempt to quit: 07/05/1976    Years since quitting: 41.3  . Smokeless tobacco: Never Used  Substance Use Topics  . Alcohol use: Yes    Alcohol/week: 1.8 oz    Types: 3 Glasses of wine per week  . Drug use: No     Allergies   Morphine; Penicillins; Prednisone; Sulfonamide derivatives; Codeine; Hydrocodone-acetaminophen; Meperidine hcl; Metoclopramide hcl; Metoprolol succinate; Moxifloxacin; Oxycodone-aspirin; Pentazocine lactate; and Pioglitazone   Review of Systems Review of Systems  All other systems reviewed and are negative.    Physical Exam Updated Vital Signs BP 140/66   Pulse 64   Temp 97.8 F (36.6 C) (Oral)   Resp 16   SpO2 93%   Physical Exam  Constitutional: She is oriented to person, place, and time. She appears well-developed and well-nourished.  Non-toxic appearance. No distress.  HENT:  Head: Normocephalic and atraumatic.  Eyes: Conjunctivae, EOM and lids are normal. Pupils are equal, round, and reactive to light.  Neck: Normal range of motion. Neck supple. No tracheal deviation present. No thyroid mass present.  Cardiovascular: Normal rate, regular rhythm and normal heart sounds. Exam reveals no gallop.  No murmur heard. Pulmonary/Chest: Effort normal and breath sounds normal. No stridor. No respiratory distress. She has no  decreased  breath sounds. She has no wheezes. She has no rhonchi. She has no rales.  Abdominal: Soft. Normal appearance and bowel sounds are normal. She exhibits no distension. There is no tenderness. There is no rebound and no CVA tenderness.  Musculoskeletal: Normal range of motion. She exhibits no edema or tenderness.  Neurological: She is alert and oriented to person, place, and time. She has normal strength. She displays no tremor. No cranial nerve deficit or sensory deficit. She displays a negative Romberg sign. GCS eye subscore is 4. GCS verbal subscore is 5. GCS motor subscore is 6.  Reflex Scores:      Patellar reflexes are 2+ on the right side and 2+ on the left side. Strength is 5 out of 5 in all 4 extremities.  Skin: Skin is warm and dry. No abrasion and no rash noted.  Psychiatric: She has a normal mood and affect. Her speech is normal and behavior is normal.  Nursing note and vitals reviewed.    ED Treatments / Results  Labs (all labs ordered are listed, but only abnormal results are displayed) Labs Reviewed  URINE CULTURE  COMPREHENSIVE METABOLIC PANEL  CBC WITH DIFFERENTIAL/PLATELET  URINALYSIS, ROUTINE W REFLEX MICROSCOPIC  TROPONIN I  TSH  CK    EKG  EKG Interpretation  Date/Time:  Thursday November 03 2017 15:12:08 EST Ventricular Rate:  63 PR Interval:    QRS Duration: 109 QT Interval:  442 QTC Calculation: 453 R Axis:   87 Text Interpretation:  Sinus rhythm Ventricular premature complex Inferior infarct, age indeterminate Abnrm T, consider ischemia, anterolateral lds No significant change since last tracing Confirmed by Lacretia Leigh ([id number]) on 11/03/2017 3:28:07 PM       Radiology No results found.  Procedures Procedures (including critical care time)  Medications Ordered in ED Medications  0.9 %  sodium chloride infusion (not administered)     Initial Impression / Assessment and Plan / ED Course  I have reviewed the triage vital signs and the nursing  notes.  Pertinent labs & imaging results that were available during my care of the patient were reviewed by me and considered in my medical decision making (see chart for details).    Patient given IV  levothyroxine.  Have consulted the hospitalist will come to admit the patient  7:22 PM Head CT ordered and received call from radiologist that was positive for CVA that is likely subacute.  Will consult neurology  Final Clinical Impressions(s) / ED Diagnoses   Final diagnoses:  Weakness    ED Discharge Orders    None       Lacretia Leigh, MD 11/03/17 1919    Lacretia Leigh, MD 11/03/17 [phone]

## 2017-11-04 ENCOUNTER — Observation Stay (HOSPITAL_COMMUNITY): Payer: Medicare Other

## 2017-11-04 ENCOUNTER — Inpatient Hospital Stay (HOSPITAL_COMMUNITY): Payer: Medicare Other

## 2017-11-04 ENCOUNTER — Observation Stay (HOSPITAL_BASED_OUTPATIENT_CLINIC_OR_DEPARTMENT_OTHER): Payer: Medicare Other

## 2017-11-04 DIAGNOSIS — R1312 Dysphagia, oropharyngeal phase: Secondary | ICD-10-CM | POA: Diagnosis not present

## 2017-11-04 DIAGNOSIS — R259 Unspecified abnormal involuntary movements: Secondary | ICD-10-CM | POA: Diagnosis not present

## 2017-11-04 DIAGNOSIS — G464 Cerebellar stroke syndrome: Secondary | ICD-10-CM | POA: Diagnosis not present

## 2017-11-04 DIAGNOSIS — E782 Mixed hyperlipidemia: Secondary | ICD-10-CM | POA: Diagnosis not present

## 2017-11-04 DIAGNOSIS — I10 Essential (primary) hypertension: Secondary | ICD-10-CM | POA: Diagnosis not present

## 2017-11-04 DIAGNOSIS — Z888 Allergy status to other drugs, medicaments and biological substances status: Secondary | ICD-10-CM | POA: Diagnosis not present

## 2017-11-04 DIAGNOSIS — Z87891 Personal history of nicotine dependence: Secondary | ICD-10-CM | POA: Diagnosis not present

## 2017-11-04 DIAGNOSIS — I252 Old myocardial infarction: Secondary | ICD-10-CM | POA: Diagnosis not present

## 2017-11-04 DIAGNOSIS — M1990 Unspecified osteoarthritis, unspecified site: Secondary | ICD-10-CM | POA: Diagnosis not present

## 2017-11-04 DIAGNOSIS — R358 Other polyuria: Secondary | ICD-10-CM | POA: Diagnosis present

## 2017-11-04 DIAGNOSIS — I739 Peripheral vascular disease, unspecified: Secondary | ICD-10-CM | POA: Diagnosis not present

## 2017-11-04 DIAGNOSIS — R278 Other lack of coordination: Secondary | ICD-10-CM | POA: Diagnosis not present

## 2017-11-04 DIAGNOSIS — I639 Cerebral infarction, unspecified: Secondary | ICD-10-CM

## 2017-11-04 DIAGNOSIS — Z7982 Long term (current) use of aspirin: Secondary | ICD-10-CM | POA: Diagnosis not present

## 2017-11-04 DIAGNOSIS — I69320 Aphasia following cerebral infarction: Secondary | ICD-10-CM | POA: Diagnosis not present

## 2017-11-04 DIAGNOSIS — E785 Hyperlipidemia, unspecified: Secondary | ICD-10-CM | POA: Diagnosis present

## 2017-11-04 DIAGNOSIS — M6281 Muscle weakness (generalized): Secondary | ICD-10-CM | POA: Diagnosis not present

## 2017-11-04 DIAGNOSIS — Z885 Allergy status to narcotic agent status: Secondary | ICD-10-CM | POA: Diagnosis not present

## 2017-11-04 DIAGNOSIS — R4182 Altered mental status, unspecified: Secondary | ICD-10-CM | POA: Diagnosis not present

## 2017-11-04 DIAGNOSIS — R1310 Dysphagia, unspecified: Secondary | ICD-10-CM | POA: Diagnosis present

## 2017-11-04 DIAGNOSIS — Z882 Allergy status to sulfonamides status: Secondary | ICD-10-CM | POA: Diagnosis not present

## 2017-11-04 DIAGNOSIS — I63312 Cerebral infarction due to thrombosis of left middle cerebral artery: Secondary | ICD-10-CM | POA: Diagnosis not present

## 2017-11-04 DIAGNOSIS — I6523 Occlusion and stenosis of bilateral carotid arteries: Secondary | ICD-10-CM | POA: Diagnosis present

## 2017-11-04 DIAGNOSIS — Z7984 Long term (current) use of oral hypoglycemic drugs: Secondary | ICD-10-CM | POA: Diagnosis not present

## 2017-11-04 DIAGNOSIS — I6300 Cerebral infarction due to thrombosis of unspecified precerebral artery: Secondary | ICD-10-CM

## 2017-11-04 DIAGNOSIS — H9190 Unspecified hearing loss, unspecified ear: Secondary | ICD-10-CM | POA: Diagnosis present

## 2017-11-04 DIAGNOSIS — E1142 Type 2 diabetes mellitus with diabetic polyneuropathy: Secondary | ICD-10-CM | POA: Diagnosis not present

## 2017-11-04 DIAGNOSIS — E039 Hypothyroidism, unspecified: Secondary | ICD-10-CM | POA: Diagnosis not present

## 2017-11-04 DIAGNOSIS — R2689 Other abnormalities of gait and mobility: Secondary | ICD-10-CM | POA: Diagnosis not present

## 2017-11-04 DIAGNOSIS — E1151 Type 2 diabetes mellitus with diabetic peripheral angiopathy without gangrene: Secondary | ICD-10-CM | POA: Diagnosis present

## 2017-11-04 DIAGNOSIS — Z7902 Long term (current) use of antithrombotics/antiplatelets: Secondary | ICD-10-CM | POA: Diagnosis not present

## 2017-11-04 DIAGNOSIS — R29700 NIHSS score 0: Secondary | ICD-10-CM | POA: Diagnosis present

## 2017-11-04 DIAGNOSIS — G8191 Hemiplegia, unspecified affecting right dominant side: Secondary | ICD-10-CM | POA: Diagnosis present

## 2017-11-04 DIAGNOSIS — I69951 Hemiplegia and hemiparesis following unspecified cerebrovascular disease affecting right dominant side: Secondary | ICD-10-CM | POA: Diagnosis not present

## 2017-11-04 DIAGNOSIS — E1165 Type 2 diabetes mellitus with hyperglycemia: Secondary | ICD-10-CM | POA: Diagnosis not present

## 2017-11-04 DIAGNOSIS — K219 Gastro-esophageal reflux disease without esophagitis: Secondary | ICD-10-CM | POA: Diagnosis present

## 2017-11-04 DIAGNOSIS — I2510 Atherosclerotic heart disease of native coronary artery without angina pectoris: Secondary | ICD-10-CM | POA: Diagnosis present

## 2017-11-04 DIAGNOSIS — Z950 Presence of cardiac pacemaker: Secondary | ICD-10-CM | POA: Diagnosis not present

## 2017-11-04 DIAGNOSIS — I255 Ischemic cardiomyopathy: Secondary | ICD-10-CM | POA: Diagnosis present

## 2017-11-04 DIAGNOSIS — F0390 Unspecified dementia without behavioral disturbance: Secondary | ICD-10-CM | POA: Diagnosis present

## 2017-11-04 DIAGNOSIS — Z880 Allergy status to penicillin: Secondary | ICD-10-CM | POA: Diagnosis not present

## 2017-11-04 LAB — GLUCOSE, CAPILLARY
GLUCOSE-CAPILLARY: 122 mg/dL — AB (ref 65–99)
GLUCOSE-CAPILLARY: 182 mg/dL — AB (ref 65–99)
GLUCOSE-CAPILLARY: 138 mg/dL — AB (ref 65–99)
Glucose-Capillary: 159 mg/dL — ABNORMAL HIGH (ref 65–99)

## 2017-11-04 LAB — ECHOCARDIOGRAM COMPLETE
E decel time: 359 msec
FS: 35 % (ref 28–44)
HEIGHTINCHES: 62 in
IVS/LV PW RATIO, ED: 1.16
LA diam end sys: 38 mm
LA diam index: 2.39 cm/m2
LASIZE: 38 mm
LAVOLA4C: 53.7 mL
LV PW d: 9.23 mm — AB (ref 0.6–1.1)
LVOT area: 2.27 cm2
LVOTD: 17 mm
MV Dec: 359
MV Peak grad: 3 mmHg
MVPKAVEL: 114 m/s
MVPKEVEL: 93.1 m/s
Weight: 2021.18 oz

## 2017-11-04 LAB — URINE CULTURE

## 2017-11-04 LAB — LIPID PANEL
Cholesterol: 361 mg/dL — ABNORMAL HIGH (ref 0–200)
HDL: 32 mg/dL — ABNORMAL LOW (ref >40)
LDL CALC: 283 mg/dL — AB (ref 0–99)
TRIGLYCERIDES: 230 mg/dL — AB (ref <150)
Total CHOL/HDL Ratio: 11.3 RATIO
VLDL: 46 mg/dL — AB (ref 0–40)

## 2017-11-04 LAB — MRSA PCR SCREENING: MRSA by PCR: NEGATIVE

## 2017-11-04 MED ORDER — ROSUVASTATIN CALCIUM 20 MG PO TABS
40.0000 mg | ORAL_TABLET | Freq: Every day | ORAL | Status: DC
  Administered 2017-11-04: 40 mg via ORAL
  Filled 2017-11-04: qty 2

## 2017-11-04 MED ORDER — PANTOPRAZOLE SODIUM 40 MG PO TBEC
40.0000 mg | DELAYED_RELEASE_TABLET | Freq: Two times a day (BID) | ORAL | Status: DC
  Administered 2017-11-04 – 2017-11-08 (×8): 40 mg via ORAL
  Filled 2017-11-04 (×9): qty 1

## 2017-11-04 MED ORDER — ONDANSETRON HCL 4 MG/2ML IJ SOLN: 4.0000 mg | Freq: Four times a day (QID) | INTRAMUSCULAR | Status: DC | PRN

## 2017-11-04 MED ORDER — CLOPIDOGREL BISULFATE 75 MG PO TABS
75.0000 mg | ORAL_TABLET | Freq: Every day | ORAL | Status: DC
  Administered 2017-11-04 – 2017-11-08 (×5): 75 mg via ORAL
  Filled 2017-11-04 (×5): qty 1

## 2017-11-04 MED ORDER — GABAPENTIN 100 MG PO CAPS
100.0000 mg | ORAL_CAPSULE | Freq: Every day | ORAL | Status: DC
  Administered 2017-11-04 – 2017-11-06 (×3): 100 mg via ORAL
  Filled 2017-11-04 (×4): qty 1

## 2017-11-04 MED ORDER — ENSURE ENLIVE PO LIQD
237.0000 mL | Freq: Two times a day (BID) | ORAL | Status: DC
  Administered 2017-11-06 – 2017-11-08 (×4): 237 mL via ORAL

## 2017-11-04 MED ORDER — LEVOTHYROXINE SODIUM 75 MCG PO TABS
150.0000 ug | ORAL_TABLET | Freq: Every day | ORAL | Status: DC
  Administered 2017-11-04 – 2017-11-08 (×5): 150 ug via ORAL
  Filled 2017-11-04 (×5): qty 2

## 2017-11-04 MED ORDER — TRAZODONE HCL 100 MG PO TABS
100.0000 mg | ORAL_TABLET | Freq: Every day | ORAL | Status: DC
  Administered 2017-11-04 – 2017-11-06 (×3): 100 mg via ORAL
  Filled 2017-11-04 (×4): qty 1

## 2017-11-04 MED ORDER — ENOXAPARIN SODIUM 30 MG/0.3ML ~~LOC~~ SOLN
30.0000 mg | SUBCUTANEOUS | Status: DC
  Administered 2017-11-04 – 2017-11-08 (×5): 30 mg via SUBCUTANEOUS
  Filled 2017-11-04 (×5): qty 0.3

## 2017-11-04 MED ORDER — ONDANSETRON HCL 4 MG PO TABS: 4.0000 mg | ORAL_TABLET | Freq: Four times a day (QID) | ORAL | Status: DC | PRN

## 2017-11-04 MED ORDER — ASPIRIN EC 81 MG PO TBEC
81.0000 mg | DELAYED_RELEASE_TABLET | Freq: Every day | ORAL | Status: DC
  Administered 2017-11-04 – 2017-11-08 (×5): 81 mg via ORAL
  Filled 2017-11-04 (×5): qty 1

## 2017-11-04 MED ORDER — TRAMADOL HCL 50 MG PO TABS: 50.0000 mg | ORAL_TABLET | Freq: Four times a day (QID) | ORAL | Status: DC | PRN

## 2017-11-04 MED ORDER — ATORVASTATIN CALCIUM 80 MG PO TABS
80.0000 mg | ORAL_TABLET | Freq: Every day | ORAL | Status: DC
  Administered 2017-11-05 – 2017-11-07 (×3): 80 mg via ORAL
  Filled 2017-11-04 (×3): qty 1

## 2017-11-04 MED ORDER — INSULIN ASPART 100 UNIT/ML ~~LOC~~ SOLN
0.0000 [IU] | Freq: Three times a day (TID) | SUBCUTANEOUS | Status: DC
  Administered 2017-11-04 – 2017-11-05 (×4): 2 [IU] via SUBCUTANEOUS
  Administered 2017-11-05: 1 [IU] via SUBCUTANEOUS
  Administered 2017-11-06: 3 [IU] via SUBCUTANEOUS
  Administered 2017-11-06: 1 [IU] via SUBCUTANEOUS
  Administered 2017-11-06 – 2017-11-07 (×2): 2 [IU] via SUBCUTANEOUS
  Administered 2017-11-07: 1 [IU] via SUBCUTANEOUS
  Administered 2017-11-07: 2 [IU] via SUBCUTANEOUS
  Administered 2017-11-08: 3 [IU] via SUBCUTANEOUS
  Administered 2017-11-08: 2 [IU] via SUBCUTANEOUS

## 2017-11-04 MED ORDER — IOPAMIDOL (ISOVUE-370) INJECTION 76%
INTRAVENOUS | Status: AC
  Administered 2017-11-04: 50 mL
  Filled 2017-11-04: qty 50

## 2017-11-04 MED ORDER — DULOXETINE HCL 60 MG PO CPEP
60.0000 mg | ORAL_CAPSULE | Freq: Every day | ORAL | Status: DC
  Administered 2017-11-04 – 2017-11-08 (×5): 60 mg via ORAL
  Filled 2017-11-04 (×5): qty 1

## 2017-11-04 NOTE — Progress Notes (Addendum)
SLP received call from RN that pt is coughing with lunch.  Inquired if pt tolerating liquids, which RN indicated yes.  Will modify diet to full liquid and follow up next date.  Advised RN to make pt npo if not tolerating full liquids and SLP will follow up tomorrow.  Requested RN to replace swallow eval order as SLP signed off.  Will plan for MBS tomorrow due to pt having dysphagia. Thanks.   Luanna Salk, [facility] SLP [phone]

## 2017-11-04 NOTE — Progress Notes (Signed)
Pt does not have a MRI compatible pacemaker. Information scanned in epic.

## 2017-11-04 NOTE — Progress Notes (Signed)
STROKE TEAM PROGRESS NOTE  Admission History: TEYLOR Steele is a 83 y.o. female who states that she has been having right-sided weakness for the past 2 days.  She states that she has noted that her right arm is not coordinated, and she has been having trouble standing on her right leg.  For that reason she presented to the emergency room where a CT was performed which shows a age-indeterminate but likely subacute infarct in the left basal ganglia. While walking the room, she asked me "why my under scrutiny!?"  When I tell her that she is being evaluated for leg weakness she responds with "well, only one is weak!"  LKW: 11/27 tpa given?: no, outside of window  SUBJECTIVE (INTERVAL HISTORY)  No family is at the bedside. Patient is found laying in bed in NAD  Overall she feels her condition is stable. Complaining of right arm weakness. Voices no new complaints. No new events reported overnight.  OBJECTIVE Lab Results: CBC:  Recent Labs  Lab 11/03/17 1549  WBC 7.8  HGB 13.9  HCT 40.9  MCV 81.6  PLT 289   BMP: Recent Labs  Lab 11/01/17 1702 11/03/17 1549  NA 135 136  K 4.5 3.8  CL 99 100*  CO2 26 26  GLUCOSE 259* 172*  BUN 19 13  CREATININE 1.06 0.94  CALCIUM 9.7 9.2   Liver Function Tests:  Recent Labs  Lab 11/01/17 1702 11/03/17 1549  AST 15 24  ALT 11 16  ALKPHOS 103 117  BILITOT 0.5 1.2  PROT 7.6 7.8  ALBUMIN 4.2 4.3   Thyroid Function Studies:  Recent Labs    11/03/17 1549  TSH 18.878*   Cardiac Enzymes:  Recent Labs  Lab 11/03/17 1549  CKTOTAL 209  TROPONINI <0.03   Recent Labs  Lab 11/03/17 1535  COLORURINE YELLOW  APPEARANCEUR CLEAR  LABSPEC 1.017  PHURINE 5.0  GLUCOSEU 50*  HGBUR NEGATIVE  BILIRUBINUR NEGATIVE  KETONESUR NEGATIVE  PROTEINUR NEGATIVE  NITRITE NEGATIVE  LEUKOCYTESUR SMALL*    PHYSICAL EXAM Temp:  [98.1 F (36.7 C)-98.4 F (36.9 C)] 98.2 F (36.8 C) (11/30 0947) Pulse Rate:  [65-77] 65 (11/30 0947) Resp:   [16-20] 18 (11/30 0947) BP: (130-177)/(50-113) 146/50 (11/30 0947) SpO2:  [95 %-98 %] 96 % (11/30 0947) Weight:  [57.3 kg (126 lb 5.2 oz)] 57.3 kg (126 lb 5.2 oz) (11/30 0102) General - Well nourished, well developed elderly Caucasian lady, in no apparent distress Respiratory - Lungs clear bilaterally. No wheezing. Cardiovascular - Regular rate and rhythm   Neuro: Mental Status: Patient is awake, alert, oriented to person,  Place, not oriented to month or year  patient is able to give a clear and coherent history. No signs of aphasia or neglect She appears to have mild psychomotor agitation. Cranial Nerves: II: Visual Fields are full. Pupils are equal, round, and reactive to light.   III,IV, VI: EOMI without ptosis or diploplia.  V: Facial sensation is symmetric to temperature VII: Facial movement is symmetric.  VIII: hearing is intact to voice X: Uvula elevates symmetrically XI: Shoulder shrug is symmetric. XII: tongue is midline without atrophy or fasciculations.  Motor: Tone is normal. Bulk is normal. 4/5 weakness of the right arm and leg with motor coordination out of proportion to weakness. Sensory: Sensation is symmetric to light touch and temperature in the arms, decreased in the right leg Deep Tendon Reflexes: 3+ and symmetric in the biceps and patellae with spread at the patella Cerebellar: She  has incoordination to date cardiomegaly of  the right arm and  minimally leg.  IMAGING: I have personally reviewed the radiological images below and agree with the radiology interpretations. Dg Chest 2 View  Result Date: 11/03/2017 CLINICAL DATA:  83 y/o  F; 1 day weakness and increased urination. EXAM: CHEST  2 VIEW COMPARISON:  03/11/2017 chest radiograph FINDINGS: Stable normal cardiac silhouette. 2 lead pacemaker. Post median sternotomy with wires in alignment. Aortic atherosclerosis with calcification. Stable chronic bronchitic changes. No focal consolidation. No pleural  effusion or pneumothorax. No acute osseous abnormality is evident. IMPRESSION: COPD. No acute pulmonary process identified. Stable cardiac silhouette. Electronically Signed   By: Kristine Garbe M.D.   On: 11/03/2017 16:32   Ct Head Wo Contrast Result Date: 11/03/2017 IMPRESSION: 1. Age-indeterminate perforator infarct in the left basal ganglia. 2. Remote lacunar infarct in the left centrum semiovale. Electronically Signed   By: Monte Fantasia M.D.   On: 11/03/2017 19:22    Echocardiogram:  Study Conclusions - Left ventricle: The cavity size was normal. Wall thickness was   normal. Systolic function was normal. The estimated ejection   fraction was in the range of 60% to 65%. Wall motion was normal;   there were no regional wall motion abnormalities. Doppler   parameters are consistent with abnormal left ventricular   relaxation (grade 1 diastolic dysfunction). - Aortic valve: Trileaflet; mildly thickened, mildly calcified   leaflets. - Right ventricle: Pacer wire or catheter noted in right ventricle. Impressions: - No cardiac source of emboli was indentified. Compared to the   prior study, there has been no significant interval change.   B/L Carotid U/S:                                                PENDING _____________________________________________________________________ ASSESSMENT: Janet Steele is a 83 y.o. female with PMH of HTN, HLS, CAD admitted for history of 2 days of Right sided weakness. CT head-subacute appearing infarct in the left basal ganglia   Age-indeterminate perforator infarct in the left basal ganglia. 2. Remote lacunar infarct in the left centrum semiovale Suspected Etiology: small vessel Resultant Symptoms: right-sided weakness Stroke Risk Factors: hyperlipidemia, hypertension and CAD, PVD Other Stroke Risk Factors: Advanced age, Coronary artery disease  Outstanding Stroke Work-up Studies: B/L Carotid U/S:                                                      PENDING  PLAN  11/04/2017: Continue Aspirin/ Plavix /Statin Started Atorvastatin 80 mg, high intensity statin MRI studies cancelled due to pacemaker.  Check CT angiogram of the brain and neck instead right thyroid nodule ongoing aggressive stroke risk factor management Patient counseled to be compliant with her antithrombotic medications  Elevated TSH Management per Medicine team  HYPERTENSION: Stable, Some elevated B/P's noted overnight Permissive hypertension (OK if <220/120) for 24-48 hours post stroke and then gradually normalized within 5-7 days. Long term BP goal normotensive. May slowly restart home B/P medications after 48 hours Home Meds: Norvasc, Metoprolol,   HYPERLIPIDEMIA:    Component Value Date/Time   CHOL 361 (H) 11/04/2017 0742   TRIG 230 (H) 11/04/2017 0742   HDL 32 (  L) 11/04/2017 0742   CHOLHDL 11.3 11/04/2017 0742   VLDL 46 (H) 11/04/2017 0742   LDLCALC 283 (H) 11/04/2017 0742  Home Meds:  Crestor 40 mg LDL  goal < 70 Started on Lipitor to 80 mg daily Continue statin at discharge  DIABETES: Lab Results  Component Value Date   HGBA1C 7.1 (H) 11/01/2017  HgbA1c goal < 7.0 Currently WG:[id number] Continue CBG monitoring and SSI DM education   Other Active Problems: Principal Problem:   CVA (cerebral vascular accident) ([place]) Active Problems:   Hypothyroidism   Diabetes ([place])   HYPERLIPIDEMIA   Essential hypertension   Abnormal involuntary movement   MCI (mild cognitive impairment) with memory loss   Weakness  Hospital day # 0  VTE prophylaxis: Lovenox  Diet : Diet full liquid Room service appropriate? Yes; Fluid consistency: Thin   Prior Home Stroke Medications:  aspirin 81 mg daily, clopidogrel 75 mg daily and Crestor 40 mg  Hospital Current Stroke Medications: ASA 81 mg, Plavix 75 mg and Lipitor 80 mg Stroke New Meds Plan: Now on aspirin 81 mg daily, clopidogrel 75 mg daily and Lipitor 80 mg  Discharge Stroke Meds: Please  discharge patient on aspirin 81 mg daily, clopidogrel 75 mg daily and Lipitor 80 mg   Disposition: 01-Home or Self Care Therapy Recs:  SNF Follow Recs:  Follow-up Information    Garvin Fila, MD. Schedule an appointment as soon as possible for a visit in 6 week(s).   Specialties:  Neurology, Radiology Contact information: [address] [place] [address] [phone]          Binnie Rail, MD-PCP in 2-3 weeks  FAMILY UPDATES: No family at bedside  TEAM UPDATES: Mariel Aloe, MD  Renie Ora Stroke Neurology Team 11/04/2017 4:06 PM I have personally examined this patient, reviewed notes, independently viewed imaging studies, participated in medical decision making and plan of care.ROS completed by me personally and pertinent positives fully documented  I have made any additions or clarifications directly to the above note. Agree with note above.  She has presented with right-sided weakness and incoordination due to suspected left subcortical or brainstem infarct.  MRI cannot be obtained due to pacemaker.  Check CT angiogram of the brain and neck instead.  Continue antiplatelet therapy and aggressive risk factor modification.  Physical occupational therapy and rehab consults.  Discussed with patient and Dr. Lonny Prude and answered questions.  Greater than 50% time during this 35-minute visit was spent in counseling and coordination of care about her stroke and discussion about prevention and treatment options.  Antony Contras, MD Medical Director [facility] Stroke Center Pager: [phone] 11/04/2017 5:55 PM   To contact Stroke Continuity provider, please refer to [email or web address] After hours, contact General Neurology

## 2017-11-04 NOTE — Progress Notes (Signed)
OT Cancellation Note  Patient Details Name: Janet Steele MRN: [id number] DOB: 02-Oct-1929   Cancelled Treatment:    Reason Eval/Treat Not Completed: Medical issues which prohibited therapy(Pt on active bed rest). OT will continue to follow for eval as schedule allows  Jaci Carrel 11/04/2017, 10:53 AM  Hulda Humphrey OTR/L [phone]

## 2017-11-04 NOTE — Progress Notes (Signed)
  Echocardiogram 2D Echocardiogram has been performed.  [place] Vierling G Adonys Wildes 11/04/2017, 10:59 AM

## 2017-11-04 NOTE — Care Management Note (Signed)
Case Management Note  Patient Details  Name: FRADEL BALDONADO MRN: [id number] Date of Birth: 1929-08-19  Subjective/Objective:  Pt admitted with CVA. She is from [facility].                   Action/Plan: Awaiting PT/OT recommendations. CM following for d/c disposition.  Expected Discharge Date:                  Expected Discharge Plan:  Assisted Living / Rest Home  In-House Referral:  Clinical Social Work  Discharge planning Services     Post Acute Care Choice:    Choice offered to:     DME Arranged:    DME Agency:     HH Arranged:    [place] Agency:     Status of Service:  In process, will continue to follow  If discussed at Long Length of Stay Meetings, dates discussed:    Additional Comments:  Pollie Friar, RN 11/04/2017, 12:14 PM

## 2017-11-04 NOTE — Progress Notes (Addendum)
PROGRESS NOTE    Janet Steele  ZOX:[id number] DOB: 05-31-1929 DOA: 11/03/2017 PCP: Binnie Rail, MD    Brief Narrative: Janet Steele is a 83 y.o. female with a history of diabetes mellitus, PVD, status post pacemaker, CAD, hypothyroidism, hypertension, hyperlipidemia, GERD, diastolic heart dysfunction.  She presented with lower extremity weakness and was found to have a likely stroke.   Assessment & Plan:   Principal Problem:   CVA (cerebral vascular accident) ([place]) Active Problems:   Hypothyroidism   Diabetes ([place])   HYPERLIPIDEMIA   Essential hypertension   Abnormal involuntary movement   MCI (mild cognitive impairment) with memory loss   Weakness   Stroke Subacute from CT per neurology read.  Patient initially passed swallow evaluation by speech therapy, but started to display symptoms of dysphasia.  Patient placed on full liquid diet  SLP. LDL significantly elevated at 283. Unsure if patient is adherent with medications. She is also on aspirin and Plavix as an outpatient. -neurology recommendations: MRI/MRA, MR cervical -echocardiogram pending -PT/OT -continue aspirin and Plavix  Mild dementia Per PCP records.   Hypothyroidism -Continue Synthroid  Involuntary movements Chronic.  Essential hypertension Permissive hypertension in the setting of stroke, although it appears this is subacute.  Lopressor, Norvasc, and Cozaar held on admission.  Janet await neurology recommendations for restarting blood pressure medications.  Hyperlipidemia -Continue Crestor  Diabetes mellitus Patient on Amaryl and Januvia as an outpatient.  Held on admission. -Continue sliding scale insulin, sensitive  Mood disorder -Continue Cymbalta  GERD -Continue Protonix   DVT prophylaxis: Lovenox Code Status: Full code Family Communication: None at bedside Disposition Plan: Pending PT/OT   Consultants:   Neurology/Stroke team  Procedures:    None  Antimicrobials:  None    Subjective: Patient reports no complaints  Objective: Vitals:   11/04/17 0016 11/04/17 0102 11/04/17 0453 11/04/17 0947  BP: (!) 167/74 (!) 177/67 (!) 159/73 (!) 146/50  Pulse: 77 71 77 65  Resp: 18 20 18 18   Temp:  98.4 F (36.9 C) 98.1 F (36.7 C) 98.2 F (36.8 C)  TempSrc:  Oral Oral Oral  SpO2: 95% 95% 96% 96%  Weight:  57.3 kg (126 lb 5.2 oz)    Height:  5\' 2"  (1.575 m)      Intake/Output Summary (Last 24 hours) at 11/04/2017 1301 Last data filed at 11/04/2017 0901 Gross per 24 hour  Intake 490 ml  Output -  Net 490 ml   Filed Weights   11/04/17 0102  Weight: 57.3 kg (126 lb 5.2 oz)    Examination:  General exam: Appears calm and comfortable Respiratory system: Clear to auscultation. Respiratory effort normal. Cardiovascular system: S1 & S2 heard, RRR. No murmurs, rubs, gallops or clicks. Gastrointestinal system: Abdomen is nondistended, soft and nontender. No organomegaly or masses felt. Normal bowel sounds heard. Central nervous system: Alert and oriented to person and place. Right sided upper arm/grip weakness Extremities: No edema. No calf tenderness Skin: No cyanosis. No rashes Psychiatry: Judgement and insight appear impaired. Mood & affect appropriate.     Data Reviewed: I have personally reviewed following labs and imaging studies  CBC: Recent Labs  Lab 11/03/17 1549  WBC 7.8  NEUTROABS 5.8  HGB 13.9  HCT 40.9  MCV 81.6  PLT 811   Basic Metabolic Panel: Recent Labs  Lab 11/01/17 1702 11/03/17 1549  NA 135 136  K 4.5 3.8  CL 99 100*  CO2 26 26  GLUCOSE 259* 172*  BUN 19 13  CREATININE 1.06 0.94  CALCIUM 9.7 9.2   GFR: Estimated Creatinine Clearance: 34 mL/min (by C-G formula based on SCr of 0.94 mg/dL). Liver Function Tests: Recent Labs  Lab 11/01/17 1702 11/03/17 1549  AST 15 24  ALT 11 16  ALKPHOS 103 117  BILITOT 0.5 1.2  PROT 7.6 7.8  ALBUMIN 4.2 4.3   No results for  input(s): LIPASE, AMYLASE in the last 168 hours. No results for input(s): AMMONIA in the last 168 hours. Coagulation Profile: No results for input(s): INR, PROTIME in the last 168 hours. Cardiac Enzymes: Recent Labs  Lab 11/03/17 1549  CKTOTAL 209  TROPONINI <0.03   BNP (last 3 results) No results for input(s): PROBNP in the last 8760 hours. HbA1C: Recent Labs    11/01/17 1702  HGBA1C 7.1*   CBG: Recent Labs  Lab 11/04/17 0623 11/04/17 1103  GLUCAP 122* 182*   Lipid Profile: Recent Labs    11/01/17 1702 11/04/17 0742  CHOL 359* 361*  HDL 34.60* 32*  LDLCALC  --  283*  TRIG 372.0* 230*  CHOLHDL 10 11.3  LDLDIRECT 254.0  --    Thyroid Function Tests: Recent Labs    11/03/17 1549  TSH 18.878*   Anemia Panel: No results for input(s): VITAMINB12, FOLATE, FERRITIN, TIBC, IRON, RETICCTPCT in the last 72 hours. Sepsis Labs: No results for input(s): PROCALCITON, LATICACIDVEN in the last 168 hours.  Recent Results (from the past 240 hour(s))  MRSA PCR Screening     Status: None   Collection Time: 11/04/17  1:55 AM  Result Value Ref Range Status   MRSA by PCR NEGATIVE NEGATIVE Final    Comment:        The GeneXpert MRSA Assay (FDA approved for NASAL specimens only), is one component of a comprehensive MRSA colonization surveillance program. It is not intended to diagnose MRSA infection nor to guide or monitor treatment for MRSA infections.          Radiology Studies: Dg Chest 2 View  Result Date: 11/03/2017 CLINICAL DATA:  82 y/o  F; 1 day weakness and increased urination. EXAM: CHEST  2 VIEW COMPARISON:  03/11/2017 chest radiograph FINDINGS: Stable normal cardiac silhouette. 2 lead pacemaker. Post median sternotomy with wires in alignment. Aortic atherosclerosis with calcification. Stable chronic bronchitic changes. No focal consolidation. No pleural effusion or pneumothorax. No acute osseous abnormality is evident. IMPRESSION: COPD. No acute pulmonary  process identified. Stable cardiac silhouette. Electronically Signed   By: Kristine Garbe M.D.   On: 11/03/2017 16:32   Ct Head Wo Contrast  Result Date: 11/03/2017 CLINICAL DATA:  Subacute neuro deficits. Weakness and foul smelling urine. EXAM: CT HEAD WITHOUT CONTRAST TECHNIQUE: Contiguous axial images were obtained from the base of the skull through the vertex without intravenous contrast. COMPARISON:  11/01/2017 FINDINGS: Brain: There is a wedge-shaped low-density in the left putamen extending to the caudate on coronal reformats. This is somewhat indistinct and and is age-indeterminate. More well-defined low-density lacunar infarct in the left centrum semiovale. No hemorrhage or hydrocephalus. No masslike finding. Vascular: Arterial calcification.  No hyperdense vessel. Skull: No acute or aggressive finding. Sinuses/Orbits: Bilateral cataract resection. These results were called by telephone at the time of interpretation on 11/03/2017 at 7:21 pm to Dr. Lacretia Leigh , who verbally acknowledged these results. IMPRESSION: 1. Age-indeterminate perforator infarct in the left basal ganglia. 2. Remote lacunar infarct in the left centrum semiovale. Electronically Signed   By: Monte Fantasia M.D.   On: 11/03/2017 19:22  Scheduled Meds: .  stroke: mapping our early stages of recovery book   Does not apply Once  . aspirin EC  81 mg Oral Daily  . clopidogrel  75 mg Oral Daily  . DULoxetine  60 mg Oral Daily  . enoxaparin (LOVENOX) injection  30 mg Subcutaneous Q24H  . gabapentin  100 mg Oral QHS  . insulin aspart  0-9 Units Subcutaneous TID WC  . levothyroxine  150 mcg Oral QAC breakfast  . pantoprazole  40 mg Oral BID  . rosuvastatin  40 mg Oral Daily  . traZODone  100 mg Oral QHS   Continuous Infusions: . sodium chloride 50 mL/hr at 11/04/17 0135     LOS: 0 days     Janet Poche, MD [facility] 11/04/2017, 1:01 PM Pager: [phone]  If 7PM-7AM, please  contact night-coverage [email or web address] Password [email or web address] 11/04/2017, 1:01 PM

## 2017-11-04 NOTE — Evaluation (Signed)
Clinical/Bedside Swallow Evaluation Patient Details  Name: Janet Steele MRN: [id number] Date of Birth: 1929/09/12  Today's Date: 11/04/2017 Time: SLP Start Time (ACUTE ONLY): 0815 SLP Stop Time (ACUTE ONLY): 0829 SLP Time Calculation (min) (ACUTE ONLY): 14 min  Past Medical History:  Past Medical History:  Diagnosis Date  . Anemia   . Anxiety   . Arthritis    "fingers" (05/19/2016  . CAD (coronary artery disease)    a. s/p CABG 1982 b. redo CABG 2003. c. [place] despite med rx 05/2016: successful but complicated PCI of PDA beyond graft site, c/b localized dissection requiring overlapping stent.  . Carotid bruit    a. Duplex 01/2015: patent vessels, 1-39% BICA, f/u PRN recommened.  . Colon polyp    adenomatous  . Colon, diverticulosis   . Degenerative joint disease   . Diastolic dysfunction   . Esophageal stricture   . GERD (gastroesophageal reflux disease)   . Hiatal hernia   . History of blood transfusion    "related to my bypass"  . Hyperlipidemia   . Hypertension   . Hypothyroidism   . Ischemic cardiomyopathy    a. Cath 05/2016: EF 40% with severe inferior wall HK, suspect hibernating myocardium.  . Myocardial infarction ([place]) 1977; 1982  . Osteoarthrosis, unspecified whether generalized or localized, unspecified site   . Presence of permanent cardiac pacemaker   . PVD (peripheral vascular disease) ([place])   . Type II diabetes mellitus ([place])   . Unspecified hearing loss    Past Surgical History:  Past Surgical History:  Procedure Laterality Date  . ABDOMINAL HYSTERECTOMY    . APPENDECTOMY    . BACK SURGERY    . CARDIAC CATHETERIZATION N/A 05/19/2016   Procedure: Left Heart Cath and Cors/Grafts Angiography;  Surgeon: Belva Crome, MD;  Location: [place] [facility] LAB;  Service: Cardiovascular;  Laterality: N/A;  . CARDIAC CATHETERIZATION N/A 05/19/2016   Procedure: Coronary Stent Intervention;  Surgeon: Belva Crome, MD;  Location: [place] CV LAB;  Service:  Cardiovascular;  Laterality: N/A;  . CATARACT EXTRACTION W/ INTRAOCULAR LENS  IMPLANT, BILATERAL Bilateral   . CHOLECYSTECTOMY OPEN    . COLONOSCOPY    . CORONARY ANGIOPLASTY WITH STENT PLACEMENT  05/19/2016   "2 stents?"  . CORONARY ARTERY BYPASS GRAFT  1982; 2003   x4(1982),02-2002 CABG X2  . ESOPHAGOGASTRODUODENOSCOPY (EGD) WITH ESOPHAGEAL DILATION  "2-3 times"  . INSERT / REPLACE / REMOVE PACEMAKER    . LEFT HEART CATHETERIZATION WITH CORONARY/GRAFT ANGIOGRAM N/A 03/05/2014   Procedure: LEFT HEART CATHETERIZATION WITH Beatrix Fetters;  Surgeon: Sinclair Grooms, MD;  Location[facility];  Service: Cardiovascular;  Laterality: N/A;  . LUMBAR [place] SURGERY  01-2000  . OOPHORECTOMY Bilateral   . PERMANENT PACEMAKER INSERTION N/A 07/06/2012   MDT Adapta L implanted by Dr Rayann Heman for symptomatic bradycardia  . TONSILLECTOMY  1930s   HPI:  83 yo female adm to [facility] with left sided weakness.  PMH + for subacute left basal ganglia cva.  CXR 11/29 showed COPD, nothing acute.  Pt imaging of brain showed left centrum semioval cva.  Pt did not pass RNSSS and swallow eval ordere.    Assessment / Plan / Recommendation Clinical Impression  Functional oropharyngeal swallow ability based on clinical swallow evaluation. Pt able to self feed and demonstrated laryngeal elevated clinically observed, no indication of residuals or airway compromise.  She denies h/o dysphagia unless she has a "sore throat".   Pt does have h/o GERD,  recommend regular/thin diet with general precautions.  Pt did not pass [place] 3 ounce water test due to requiring rest break for respiratory support but clinically is funcitonal.    Thanks for this referral.   SLP Visit Diagnosis: Dysphagia, unspecified (R13.10)    Aspiration Risk  Mild aspiration risk    Diet Recommendation Regular;Thin liquid   Liquid Administration via: Cup;Straw Medication Administration: Whole meds with liquid Supervision: Patient able to self  feed Compensations: Slow rate;Small sips/bites;Minimize environmental distractions Postural Changes: Seated upright at 90 degrees;Remain upright for at least 30 minutes after po intake    Other  Recommendations Oral Care Recommendations: Oral care BID   Follow up Recommendations None      Frequency and Duration            Prognosis        Swallow Study   General Date of Onset: 11/04/17 HPI: 83 yo female adm to [facility] with left sided weakness.  PMH + for subacute left basal ganglia cva.  CXR 11/29 showed COPD, nothing acute.  Pt imaging of brain showed left centrum semioval cva.  Pt did not pass RNSSS and swallow eval ordere.  Type of Study: Bedside Swallow Evaluation Diet Prior to this Study: NPO Temperature Spikes Noted: No Respiratory Status: Room air History of Recent Intubation: No Behavior/Cognition: Alert;Cooperative;Pleasant mood Oral Cavity Assessment: Within Functional Limits Oral Care Completed by SLP: No Oral Cavity - Dentition: Dentures, top;Dentures, bottom Vision: Functional for self-feeding Self-Feeding Abilities: Able to feed self Patient Positioning: Upright in bed Baseline Vocal Quality: Normal Volitional Cough: Strong Volitional Swallow: Able to elicit    Oral/Motor/Sensory Function Overall Oral Motor/Sensory Function: Within functional limits(? mild facial asymmetry)   Ice Chips Ice chips: Not tested   Thin Liquid Thin Liquid: Within functional limits Presentation: Self Fed;Cup;Straw    Nectar Thick Nectar Thick Liquid: Not tested   Honey Thick Honey Thick Liquid: Not tested   Puree Puree: Within functional limits Presentation: Self Fed;Spoon   Solid   GO   Solid: Within functional limits Presentation: Self Fed;Spoon    Functional Assessment Tool Used: clinical judgement Functional Limitations: Swallowing Swallow Current Status (P9509): At least 1 percent but less than 20 percent impaired, limited or restricted Swallow Goal Status [phone]): At  least 1 percent but less than 20 percent impaired, limited or restricted Swallow Discharge Status [phone]): At least 1 percent but less than 20 percent impaired, limited or restricted   [name] 11/04/2017,8:38 AM  Luanna Salk, [facility] SLP [phone]

## 2017-11-04 NOTE — Progress Notes (Signed)
Initial Nutrition Assessment  DOCUMENTATION CODES:   Not applicable  INTERVENTION:   Ensure Enlive po BID, each supplement provides 350 kcal and 20 grams of protein  NUTRITION DIAGNOSIS:   Increased nutrient needs related to acute illness as evidenced by estimated needs.  GOAL:   Patient will meet greater than or equal to 90% of their needs  MONITOR:   PO intake, Supplement acceptance, Diet advancement, Weight trends, I & O's  REASON FOR ASSESSMENT:   Consult Other (Comment)(CVA)  ASSESSMENT:   Pt with PMH of HLD, GERD, PVD, HTN, pacemaker placement, type II DM, osteoarthritis presents with CVA   Pt's diet was downgraded to full liquids this afternoon after coughing on lunch, report they will follow-up and plan for MBS tomorrow.  Pt did not speak with RD at visit related to questions only shook her head yes or no.  Pt reports no changes to weight. Per chart, pt's weight is stable. Pt reports good intake PTA. RD to monitor for diet changes and nutritional needs.   Labs reviewed; CBG 122-182 Medications reviewed; sliding scale insulin, Protonix  NUTRITION - FOCUSED PHYSICAL EXAM:    Most Recent Value  Orbital Region  No depletion  Upper Arm Region  No depletion  Thoracic and Lumbar Region  No depletion  Buccal Region  No depletion  Temple Region  Mild depletion  Clavicle Bone Region  No depletion  Clavicle and Acromion Bone Region  No depletion  Scapular Bone Region  No depletion  Dorsal Hand  No depletion  Patellar Region  Moderate depletion  Anterior Thigh Region  Moderate depletion  Posterior Calf Region  Moderate depletion  Edema (RD Assessment)  None       Diet Order:  Diet full liquid Room service appropriate? Yes; Fluid consistency: Thin  EDUCATION NEEDS:   No education needs have been identified at this time  Skin:  Skin Assessment: Reviewed RN Assessment  Last BM:  11/03/17  Height:   Ht Readings from Last 1 Encounters:  11/04/17 5\' 2"   (1.575 m)    Weight:   Wt Readings from Last 1 Encounters:  11/04/17 126 lb 5.2 oz (57.3 kg)    Ideal Body Weight:  50 kg  BMI:  Body mass index is 23.1 kg/m.  Estimated Nutritional Needs:   Kcal:  1400-1600  Protein:  70-85 grams   Fluid:  >/= 1.4 L/d   Parks Ranger, MS, RDN, LDN 11/04/2017 4:59 PM

## 2017-11-04 NOTE — Plan of Care (Signed)
Patient still very unsteady on her feet and having a problem swallowing solid foods. ST and PT to see her again tomorrow.

## 2017-11-04 NOTE — Plan of Care (Signed)
  Education: Knowledge of General Education information will improve 11/04/2017 0505 - Progressing by Anson Fret, RN Note POC and orders reviewed with pt.

## 2017-11-04 NOTE — Clinical Social Work Note (Signed)
Clinical Social Work Assessment  Patient Details  Name: Janet Steele MRN: [id number] Date of Birth: 1929-07-29  Date of referral:  11/04/17               Reason for consult:  Facility Placement                Permission sought to share information with:  Facility Art therapist granted to share information::  No(pt disoriented at this time)  Name::     Deetta Perla  Agency::  SNF; home [facility]  Relationship::  son/HCPOA  Contact Information:     Housing/Transportation Living arrangements for the past 2 months:  [place] of Information:  [place], Adult Children Patient Interpreter Needed:  None Criminal Activity/Legal Involvement Pertinent to Current Situation/Hospitalization:  No - Comment as needed Significant Relationships:  Adult Children, Spouse Lives with:  Spouse Do you feel safe going back to the place where you live?  No Need for family participation in patient care:  Yes (Comment)(decision making)  Care giving concerns:  Pt lives in [place] IDL with spouse- at this time pt with increase in impairment and concerns for her safety at home.   Social Worker assessment / plan:  CSW spoke with pt son concerning PT recommendation for SNF.  CSW explained SNF and SNF referral process.  Pt son reports pt and spouse have been in IDL since this summer and have found the change to be positive.  CSW explained possible barriers to placement including Medicare 3 night stay  CSW also discussed referral to [facility] program which would not require a 3 night stay and which would allow for a much higher level of support at home if patient does not qualify for inpatient status for 3 nights.  Employment status:  Retired Forensic scientist:  Medicare PT Recommendations:  [place] / Referral to community resources:  [place]  Patient/Family's Response to care:  Pt son very appreciative of  help and agreeable to either SNF or [facility] depending on hospital course.  Patient/Family's Understanding of and Emotional Response to Diagnosis, Current Treatment, and Prognosis:  No questions or concerns- hopeful pt will improve enough to return to IDL safely.  Emotional Assessment Appearance:  Appears stated age Attitude/Demeanor/Rapport:  Unable to Assess Affect (typically observed):  Unable to Assess Orientation:  Oriented to Self, Oriented to Place Alcohol / Substance use:  Not Applicable Psych involvement (Current and /or in the community):  No (Comment)  Discharge Needs  Concerns to be addressed:  Care Coordination Readmission within the last 30 days:  No Current discharge risk:  Physical Impairment Barriers to Discharge:  Continued Medical Work up   Jorge Ny, LCSW 11/04/2017, 3:31 PM

## 2017-11-04 NOTE — Progress Notes (Signed)
PT Cancellation Note  Patient Details Name: Janet Steele MRN: [id number] DOB: July 11, 1929   Cancelled Treatment:    Reason Eval/Treat Not Completed: Patient not medically ready Pt on strict bedrest. Will await increase in activity orders prior to PT evaluation.   Marguarite Arbour A [address] 11/04/2017, 8:03 AM Wray Kearns, PT, DPT [phone]

## 2017-11-04 NOTE — Evaluation (Signed)
Physical Therapy Evaluation Patient Details Name: Janet WILBOURNE MRN: [id number] DOB: Apr 11, 1929 Today's Date: 11/04/2017   History of Present Illness  Patient is a 83 y/o female who presents with right sided weakness and polyuria. Head CT- Age-indeterminate perforator infarct in the left basal ganglia. PMH includes dementia, DM, PVD, pacemaker ,HTN, MI, CAD, ischemic cardiomyopathy.   Clinical Impression  Patient presents with right sided weakness, ?impaired motor planning, baseline cognitive deficits, difficulty with speech (expressing self, word finding), impaired balance and impaired mobility s/p above. Tolerated standing and taking a few steps to get to chair with Min-Mod A for balance/safety. Pt fearful of falling. Pt from [facility] with spouse. Pt used RW PTA. Not sure of other PLOF as pt not a great historian. Would benefit from SNF to maximize independence and mobility prior to return home. Will follow acutely.     Follow Up Recommendations SNF;Supervision for mobility/OOB;Supervision/Assistance - 24 hour    Equipment Recommendations  None recommended by PT    Recommendations for Other Services       Precautions / Restrictions Precautions Precautions: Fall Restrictions Weight Bearing Restrictions: No      Mobility  Bed Mobility Overal bed mobility: Needs Assistance Bed Mobility: Supine to Sit     Supine to sit: HOB elevated;Min assist     General bed mobility comments: Min A to scoot bottom to EOB, increased time and use of rail.   Transfers Overall transfer level: Needs assistance Equipment used: Rolling walker (2 wheeled) Transfers: Sit to/from Stand Sit to Stand: Min assist         General transfer comment: Assist to power to standing with cues for hand placement/technique. Difficulty controlling RUE to grab walker handle. POsterior lean through hips- fearful of falling.  Ambulation/Gait Ambulation/Gait assistance: Mod assist Ambulation Distance  (Feet): 3 Feet Assistive device: Rolling walker (2 wheeled) Gait Pattern/deviations: Step-to pattern;Narrow base of support;Trunk flexed;Decreased weight shift to right Gait velocity: decreased   General Gait Details: Difficulty with motor planning to progress RLE, assist with weightshifting to take a few steps to get to chair, fearful of falling, unsteady. Exaggerated steppage pattern with RLE to advance.   Stairs            Wheelchair Mobility    Modified Rankin (Stroke Patients Only) Modified Rankin (Stroke Patients Only) Pre-Morbid Rankin Score: Moderately severe disability Modified Rankin: Moderately severe disability     Balance Overall balance assessment: Needs assistance Sitting-balance support: Feet supported;No upper extremity supported Sitting balance-Leahy Scale: Fair Sitting balance - Comments: Pt with posterior lean during MMT of BLEs but able to recover without assist.  Postural control: Posterior lean Standing balance support: During functional activity;Bilateral upper extremity supported Standing balance-Leahy Scale: Poor Standing balance comment: Reliant on BUEs for support in standing and Min-Mod A for static/dynamic standing.                              Pertinent Vitals/Pain Pain Assessment: Faces Faces Pain Scale: No hurt    Home Living Family/patient expects to be discharged to:: Assisted living([facility])               Home Equipment: Walker - 2 wheels      Prior Function Level of Independence: Needs assistance   Gait / Transfers Assistance Needed: Uses RW for ambulation. Looks to have had multiple falls as there are scrapes along BLEs and bruises.      Comments: Not sure of  pt's PLOF as pt a poor historian and hx of dementia. Pt lives with spouse in ALF and uses RW for ambulation.      Hand Dominance        Extremity/Trunk Assessment   Upper Extremity Assessment Upper Extremity Assessment: Defer to OT  evaluation    Lower Extremity Assessment Lower Extremity Assessment: RLE deficits/detail;LLE deficits/detail RLE Deficits / Details: Grossly ~4/5 throughout; difficulty with motor planning during functional tasks. RLE Sensation: (Reports WFL.) LLE Deficits / Details: Grossly ~3+-4/5 throughout.    Cervical / Trunk Assessment Cervical / Trunk Assessment: Kyphotic  Communication   Communication: Expressive difficulties("I just can't talk right now.")  Cognition Arousal/Alertness: Awake/alert Behavior During Therapy: Restless(irritated her spouse is not here yet) Overall Cognitive Status: No family/caregiver present to determine baseline cognitive functioning Area of Impairment: Orientation;Memory;Following commands;Safety/judgement;Problem solving;Attention;Awareness                 Orientation Level: Disoriented to;Place;Time;Situation Current Attention Level: Sustained Memory: Decreased short-term memory Following Commands: Follows one step commands with increased time Safety/Judgement: Decreased awareness of deficits Awareness: Intellectual Problem Solving: Difficulty sequencing;Requires verbal cues;Requires tactile cues General Comments: Pt fidgeting and messing with all lines/pillows and anything in her reach. Able to be redirected but heavily distracted. Difficulty expressing self when asked questions. Knows it is "friday."      General Comments General comments (skin integrity, edema, etc.): No family members present to provide PLOF/history.     Exercises     Assessment/Plan    PT Assessment Patient needs continued PT services  PT Problem List Decreased strength;Decreased mobility;Decreased safety awareness;Decreased cognition;Decreased skin integrity;Decreased balance       PT Treatment Interventions Functional mobility training;Balance training;Patient/family education;Gait training;Therapeutic activities;Neuromuscular re-education;Therapeutic exercise;DME  instruction    PT Goals (Current goals can be found in the Care Plan section)  Acute Rehab PT Goals Patient Stated Goal: to go home NOW PT Goal Formulation: With patient Time For Goal Achievement: 11/18/17 Potential to Achieve Goals: Good    Frequency Min 3X/week   Barriers to discharge Decreased caregiver support not surehow much physical support spouse can provide or ALF    Co-evaluation               AM-PAC PT "6 Clicks" Daily Activity  Outcome Measure Difficulty turning over in bed (including adjusting bedclothes, sheets and blankets)?: None Difficulty moving from lying on back to sitting on the side of the bed? : None Difficulty sitting down on and standing up from a chair with arms (e.g., wheelchair, bedside commode, etc,.)?: Unable Help needed moving to and from a bed to chair (including a wheelchair)?: A Lot Help needed walking in hospital room?: A Lot Help needed climbing 3-5 steps with a railing? : Total 6 Click Score: 14    End of Session Equipment Utilized During Treatment: Gait belt Activity Tolerance: Patient tolerated treatment well Patient left: in chair;with call bell/phone within reach;with chair alarm set Nurse Communication: Mobility status PT Visit Diagnosis: Hemiplegia and hemiparesis;Difficulty in walking, not elsewhere classified (R26.2);Unsteadiness on feet (R26.81);Other abnormalities of gait and mobility (R26.89) Hemiplegia - Right/Left: Right Hemiplegia - caused by: Cerebral infarction    Time: 1329-1350 PT Time Calculation (min) (ACUTE ONLY): 21 min   Charges:   PT Evaluation $PT Eval Moderate Complexity: 1 Mod     PT G Codes:   PT G-Codes **NOT FOR INPATIENT CLASS** Functional Assessment Tool Used: Clinical judgement Functional Limitation: Mobility: Walking and moving around Mobility: Walking and Moving Around Current Status (  [id number]): At least 40 percent but less than 60 percent impaired, limited or restricted Mobility: Walking and  Moving Around Goal Status [phone]): At least 20 percent but less than 40 percent impaired, limited or restricted    [place], [place], [place] 11/04/2017, 2:13 PM

## 2017-11-04 NOTE — Care Management Obs Status (Signed)
[facility] NOTIFICATION   Patient Details  Name: Janet Steele MRN: [id number] Date of Birth: [birth year]   Medicare Observation Status Notification Given:  Yes    Carles Collet, RN 11/04/2017, 11:24 AM

## 2017-11-05 LAB — GLUCOSE, CAPILLARY
GLUCOSE-CAPILLARY: 197 mg/dL — AB (ref 65–99)
Glucose-Capillary: 134 mg/dL — ABNORMAL HIGH (ref 65–99)
Glucose-Capillary: 175 mg/dL — ABNORMAL HIGH (ref 65–99)

## 2017-11-05 NOTE — Evaluation (Signed)
Occupational Therapy Evaluation Patient Details Name: Janet Steele MRN: [id number] DOB: 15-Mar-1929 Today's Date: 11/05/2017    History of Present Illness Patient is a 83 y/o female who presents with right sided weakness and polyuria. Head CT- Age-indeterminate perforator infarct in the left basal ganglia. PMH includes dementia, DM, PVD, pacemaker ,HTN, MI, CAD, ischemic cardiomyopathy.    Clinical Impression   Pt admitted with above. She demonstrates the below listed deficits and will benefit from continued OT to maximize safety and independence with BADLs.  Pt presents to OT with impaired balance, Rt hemiparesis with sensory deficit, impaired cognition, decreased activity tolerance.  She currently requires min - mod A for ADLs and functional mobility.  PTA, she lived in [place] with spouse and was independent with ADLs.  Feel she will need SNF rehab at discharge.        Follow Up Recommendations  SNF;Supervision/Assistance - 24 hour    Equipment Recommendations  None recommended by OT    Recommendations for Other Services       Precautions / Restrictions Precautions Precautions: Fall      Mobility Bed Mobility Overal bed mobility: Needs Assistance Bed Mobility: Sit to Supine       Sit to supine: Min assist   General bed mobility comments: assist to lift LEs onto bed   Transfers Overall transfer level: Needs assistance Equipment used: 1 person hand held assist Transfers: Sit to/from [facility] Sit to Stand: Min assist Stand pivot transfers: Mod assist       General transfer comment: assist to power up into standing, assist for balance, and assist to pivot     Balance Overall balance assessment: Needs assistance Sitting-balance support: Feet supported;No upper extremity supported Sitting balance-Leahy Scale: Fair     Standing balance support: During functional activity;Bilateral upper extremity supported Standing balance-Leahy Scale:  Poor Standing balance comment: requires mod a                            ADL either performed or assessed with clinical judgement   ADL Overall ADL's : Needs assistance/impaired Eating/Feeding: Set up;Sitting   Grooming: Wash/dry hands;Wash/dry face;Oral care;Brushing hair;Minimal assistance;Sitting   Upper Body Bathing: Minimal assistance;Sitting   Lower Body Bathing: Moderate assistance;Sit to/from stand   Upper Body Dressing : Moderate assistance;Sitting   Lower Body Dressing: Moderate assistance;Sit to/from stand Lower Body Dressing Details (indicate cue type and reason): increased time and effort to don sock   Toilet Transfer: Moderate assistance;Stand-pivot;BSC Toilet Transfer Details (indicate cue type and reason): mod a to move into standing, and assist to pivot hips  Toileting- Clothing Manipulation and Hygiene: Maximal assistance;Sit to/from stand       Functional mobility during ADLs: Moderate assistance       Vision Baseline Vision/History: Wears glasses Wears Glasses: Reading only Patient Visual Report: Blurring of vision Vision Assessment?: Yes Eye Alignment: Within Functional Limits Ocular Range of Motion: Within Functional Limits Alignment/Gaze Preference: Within Defined Limits Tracking/Visual Pursuits: Able to track stimulus in all quads without difficulty Visual Fields: No apparent deficits     Perception Perception Perception Tested?: Yes   Praxis Praxis Praxis tested?: Deficits Deficits: Limb apraxia    Pertinent Vitals/Pain Pain Assessment: No/denies pain     Hand Dominance Right   Extremity/Trunk Assessment Upper Extremity Assessment Upper Extremity Assessment: RUE deficits/detail RUE Deficits / Details: Grossly 3-/5.   dysmetria present.  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  she states it was moving around in the bed last pm, and woke her up several times  RUE Sensation: decreased proprioception RUE  Coordination: decreased fine motor;decreased gross motor   Lower Extremity Assessment Lower Extremity Assessment: Defer to PT evaluation   Cervical / Trunk Assessment Cervical / Trunk Assessment: Kyphotic   Communication Communication Communication: Expressive difficulties(intermittent word finding difficulties )   Cognition Arousal/Alertness: Awake/alert Behavior During Therapy: WFL for tasks assessed/performed Overall Cognitive Status: No family/caregiver present to determine baseline cognitive functioning                     Current Attention Level: Selective Memory: Decreased short-term memory Following Commands: Follows multi-step commands consistently   Awareness: Intellectual Problem Solving: Difficulty sequencing;Requires verbal cues General Comments: Pt with h/o dementia    General Comments       Exercises     Shoulder Instructions      Home Living Family/patient expects to be discharged to:: Skilled nursing facility                             Home Equipment: Walker - 2 wheels   Additional Comments: Pt reports she lives with spouse       Prior Functioning/Environment Level of Independence: Needs assistance  Gait / Transfers Assistance Needed: Uses RW for ambulation. Looks to have had multiple falls as there are scrapes along BLEs and bruises.  ADL's / Homemaking Assistance Needed: Pt reports she was independent PTA    Comments: no family present to confirm info         OT Problem List: Decreased strength;Decreased range of motion;Decreased activity tolerance;Impaired balance (sitting and/or standing);Decreased cognition;Impaired vision/perception;Decreased coordination;Decreased safety awareness;Decreased knowledge of use of DME or AE;Impaired sensation;Impaired UE functional use      OT Treatment/Interventions: Self-care/ADL training;Neuromuscular education;DME and/or AE instruction;Therapeutic activities;Cognitive  remediation/compensation;Visual/perceptual remediation/compensation;Patient/family education;Balance training    OT Goals(Current goals can be found in the care plan section) Acute Rehab OT Goals Patient Stated Goal: to get back to normal  OT Goal Formulation: With patient Time For Goal Achievement: 11/19/17 Potential to Achieve Goals: Good ADL Goals Pt Will Perform Grooming: with min assist;standing Pt Will Perform Upper Body Bathing: sitting;with supervision Pt Will Perform Lower Body Bathing: with min assist;sit to/from stand Pt Will Perform Upper Body Dressing: with supervision;sitting Pt Will Perform Lower Body Dressing: with min assist;sit to/from stand Pt Will Transfer to Toilet: with min assist;stand pivot transfer;bedside commode Pt Will Perform Toileting - Clothing Manipulation and hygiene: with min assist;sit to/from stand  OT Frequency: Min 2X/week   Barriers to D/C: Decreased caregiver support          Co-evaluation              AM-PAC PT "6 Clicks" Daily Activity     Outcome Measure Help from another person eating meals?: A Little Help from another person taking care of personal grooming?: A Little Help from another person toileting, which includes using toliet, bedpan, or urinal?: A Lot Help from another person bathing (including washing, rinsing, drying)?: A Lot Help from another person to put on and taking off regular upper body clothing?: A Lot Help from another person to put on and taking off regular lower body clothing?: A Lot 6 Click Score: 14   End of Session Nurse Communication: Mobility status  Activity Tolerance: Patient tolerated treatment well Patient left: in bed;with call bell/phone within reach;with bed alarm set  OT Visit Diagnosis: Unsteadiness on feet (R26.81);Hemiplegia and hemiparesis Hemiplegia - Right/Left: Right Hemiplegia - caused by: Cerebral infarction                Time: 2820-8138 OT Time Calculation (min): 14 min Charges:   OT General Charges $OT Visit: 1 Visit OT Evaluation $OT Eval Moderate Complexity: 1 Mod G-Codes:     [facility], OTR/L [phone]   Lucille Passy M 11/05/2017, 3:01 PM

## 2017-11-05 NOTE — Progress Notes (Signed)
CSW provided pt son with list of current offers- hopeful pt will be able to stay till Monday so will qualify for SNF- thinks that home would be too much for pt spouse at this time  Jorge Ny, [place] Worker [phone]

## 2017-11-05 NOTE — Plan of Care (Signed)
  Education: Knowledge of General Education information will improve 11/05/2017 [phone] - Progressing by Ames Dura, RN   Health Behavior/Discharge Planning: Ability to manage health-related needs will improve 11/05/2017 0833 - Progressing by Ames Dura, RN   Clinical Measurements: Ability to maintain clinical measurements within normal limits will improve 11/05/2017 0833 - Progressing by Ames Dura, RN Will remain free from infection 11/05/2017 9675 - Progressing by Ames Dura, RN Diagnostic test results will improve 11/05/2017 0833 - Progressing by Ames Dura, RN Respiratory complications will improve 11/05/2017 0833 - Progressing by Ames Dura, RN Cardiovascular complication will be avoided 11/05/2017 9163 - Progressing by Ames Dura, RN   Activity: Risk for activity intolerance will decrease 11/05/2017 0833 - Progressing by Ames Dura, RN   Nutrition: Adequate nutrition will be maintained 11/05/2017 0833 - Progressing by Ames Dura, RN   Safety: Ability to remain free from injury will improve 11/05/2017 0833 - Progressing by Ames Dura, RN   Pain Managment: General experience of comfort will improve 11/05/2017 0833 - Progressing by Ames Dura, RN   Education: Knowledge of disease or condition will improve 11/05/2017 0833 - Progressing by Ames Dura, RN Knowledge of secondary prevention will improve 11/05/2017 [phone] - Progressing by Ames Dura, RN Knowledge of patient specific risk factors addressed and post discharge goals established will improve 11/05/2017 0833 - Progressing by Ames Dura, RN

## 2017-11-05 NOTE — Progress Notes (Signed)
  Speech Language Pathology Treatment: Dysphagia  Patient Details Name: Janet Steele MRN: [id number] DOB: 12/11/1928 Today's Date: 11/05/2017 Time: 0601-5615 SLP Time Calculation (min) (ACUTE ONLY): 28 min  Assessment / Plan / Recommendation Clinical Impression  RN called SLP 11/30 to report pt with significant coughing with meal tray.  SLP modified diet to full liquids as RN reports improved tolerance of liquids.  MBS planned for today, unfortunately unable to complete MBS today.  However per conversation with pt today, she has reports h/o "coughing with eating for a long time".  She states her esophagus has required stretching x2 (05/2015 and once since per pt) with improvement of swallow.  She states she is "due again" for stretching - advised her to follow up with GI as an OP to faciliate as needed.  SLP visit to determine readiness for dietary advancement.  Pt denies changes with swallowing currently and wants solid foods.  Educated pt to increased aspiration pna risk if she is aspirating with her decreased mobility from this event.  Pt agreeable to consider chopped diet/thin liquids.  Will phone MD and do not suspect primary oropharyngeal dysphagia given more solid food dysphagia.  Will follow up briefly and see pt for SLE.     HPI HPI: 83 yo female adm to [facility] with left sided weakness.  PMH + for subacute left basal ganglia cva.  CXR 11/29 showed COPD, nothing acute.  Pt imaging of brain showed left centrum semioval cva.  Pt did not pass RNSSS and swallow eval ordered.  Pt did well yesterday with eval but was overtly coughing with intake of solids.  Marland Kitchen       SLP Plan          Recommendations  Diet recommendations: Dysphagia 2 (fine chop);Thin liquid Liquids provided via: Cup;Straw Medication Administration: Whole meds with liquid Compensations: Slow rate;Small sips/bites;Minimize environmental distractions                Oral Care Recommendations: Oral care BID Follow up  Recommendations: None(consider esophagram if MD indicates/desires) SLP Visit Diagnosis: Dysphagia, unspecified (R13.10)       Gardner, MS [facility] SLP [phone]  Janet Steele 11/05/2017, 4:19 PM

## 2017-11-05 NOTE — Progress Notes (Signed)
STROKE TEAM PROGRESS NOTE  Admission History: Janet Steele is a 83 y.o. female who states that she has been having right-sided weakness for the past 2 days.  She states that she has noted that her right arm is not coordinated, and she has been having trouble standing on her right leg.  For that reason she presented to the emergency room where a CT was performed which shows a age-indeterminate but likely subacute infarct in the left basal ganglia. While walking the room, she asked me "why my under scrutiny!?"  When I tell her that she is being evaluated for leg weakness she responds with "well, only one is weak!"  LKW: 11/27 tpa given?: no, outside of window  SUBJECTIVE (INTERVAL HISTORY)  No family is at the bedside. Patient is found laying in bed in NAD  Overall she feels her condition is stable. Still complaining of right arm weakness but slightly improved. Voices no new complaints. No new events reported overnight.Ct angio shows 50% right ICA and 65% left ICA stenosis. CT scan shows acute perforator type infarct in the left basal ganglia  OBJECTIVE Lab Results: CBC:  Recent Labs  Lab 11/03/17 1549  WBC 7.8  HGB 13.9  HCT 40.9  MCV 81.6  PLT 289   BMP: Recent Labs  Lab 11/01/17 1702 11/03/17 1549  NA 135 136  K 4.5 3.8  CL 99 100*  CO2 26 26  GLUCOSE 259* 172*  BUN 19 13  CREATININE 1.06 0.94  CALCIUM 9.7 9.2   Liver Function Tests:  Recent Labs  Lab 11/01/17 1702 11/03/17 1549  AST 15 24  ALT 11 16  ALKPHOS 103 117  BILITOT 0.5 1.2  PROT 7.6 7.8  ALBUMIN 4.2 4.3   Thyroid Function Studies:  Recent Labs    11/03/17 1549  TSH 18.878*   Cardiac Enzymes:  Recent Labs  Lab 11/03/17 1549  CKTOTAL 209  TROPONINI <0.03   Recent Labs  Lab 11/03/17 1535  COLORURINE YELLOW  APPEARANCEUR CLEAR  LABSPEC 1.017  PHURINE 5.0  GLUCOSEU 50*  HGBUR NEGATIVE  BILIRUBINUR NEGATIVE  KETONESUR NEGATIVE  PROTEINUR NEGATIVE  NITRITE NEGATIVE  LEUKOCYTESUR  SMALL*    PHYSICAL EXAM Temp:  [97.9 F (36.6 C)-98.9 F (37.2 C)] 98.9 F (37.2 C) (12/01 1005) Pulse Rate:  [60-65] 62 (12/01 1005) Resp:  [18-20] 18 (12/01 1005) BP: (110-166)/(36-66) 110/36 (12/01 1005) SpO2:  [94 %-97 %] 97 % (12/01 1005) General - Well nourished, well developed elderly Caucasian lady, in no apparent distress Respiratory - Lungs clear bilaterally. No wheezing. Cardiovascular - Regular rate and rhythm   Neuro: Mental Status: Patient is awake, alert, oriented to person,  Place, not oriented to month or year  patient is able to give a clear and coherent history. No signs of aphasia or neglect She appears to have mild psychomotor agitation. Cranial Nerves: II: Visual Fields are full. Pupils are equal, round, and reactive to light.   III,IV, VI: EOMI without ptosis or diploplia.  V: Facial sensation is symmetric to temperature VII: Facial movement is symmetric.  VIII: hearing is intact to voice X: Uvula elevates symmetrically XI: Shoulder shrug is symmetric. XII: tongue is midline without atrophy or fasciculations.  Motor: Tone is normal. Bulk is normal. 4/5 weakness of the right arm and leg with motor coordination out of proportion to weakness. Sensory: Sensation is symmetric to light touch and temperature in the arms, decreased in the right leg Deep Tendon Reflexes: 3+ and symmetric in the  biceps and patellae with spread at the patella Cerebellar: She has incoordination to date cardiomegaly of  the right arm and  minimally leg.  IMAGING: I have personally reviewed the radiological images below and agree with the radiology interpretations. Dg Chest 2 View  Result Date: 11/03/2017 CLINICAL DATA:  83 y/o  F; 1 day weakness and increased urination. EXAM: CHEST  2 VIEW COMPARISON:  03/11/2017 chest radiograph FINDINGS: Stable normal cardiac silhouette. 2 lead pacemaker. Post median sternotomy with wires in alignment. Aortic atherosclerosis with  calcification. Stable chronic bronchitic changes. No focal consolidation. No pleural effusion or pneumothorax. No acute osseous abnormality is evident. IMPRESSION: COPD. No acute pulmonary process identified. Stable cardiac silhouette. Electronically Signed   By: Kristine Garbe M.D.   On: 11/03/2017 16:32   Ct Head Wo Contrast Result Date: 11/03/2017 IMPRESSION: 1. Age-indeterminate perforator infarct in the left basal ganglia. 2. Remote lacunar infarct in the left centrum semiovale. Electronically Signed   By: Monte Fantasia M.D.   On: 11/03/2017 19:22   CT angio neck and brain 11/04/2017 :  1. Probable evolving acute perforator type infarct involving the posterior left lentiform nucleus/corona radiata. No associated hemorrhage or mass effect. 2. Atheromatous plaque about the carotid bifurcations bilaterally. Associated stenosis of up to 50% at the origin of the right ICA, and up to 65% at the origin of the left ICA. 3. Severely limited evaluation of the intracranial circulation due to extensive motion artifact Echocardiogram:  Study Conclusions - Left ventricle: The cavity size was normal. Wall thickness was   normal. Systolic function was normal. The estimated ejection   fraction was in the range of 60% to 65%. Wall motion was normal;   there were no regional wall motion abnormalities. Doppler   parameters are consistent with abnormal left ventricular   relaxation (grade 1 diastolic dysfunction). - Aortic valve: Trileaflet; mildly thickened, mildly calcified   leaflets. - Right ventricle: Pacer wire or catheter noted in right ventricle. Impressions: - No cardiac source of emboli was indentified. Compared to the   prior study, there has been no significant interval change.   B/L Carotid U/S:                                                PENDING _____________________________________________________________________ ASSESSMENT: Janet Steele is a 83 y.o. female with  PMH of HTN, HLS, CAD admitted for history of 2 days of Right sided weakness. CT head-subacute appearing infarct in the left basal ganglia   Age-indeterminate perforator infarct in the left basal ganglia. 2. Remote lacunar infarct in the left centrum semiovale Suspected Etiology: small vessel Resultant Symptoms: right-sided weakness Stroke Risk Factors: hyperlipidemia, hypertension and CAD, PVD Other Stroke Risk Factors: Advanced age, Coronary artery disease  Outstanding Stroke Work-up Studies: B/L Carotid U/S:                                                     PENDING  PLAN  11/05/2017: Continue Aspirin/ Plavix /Statin Started Atorvastatin 80 mg, high intensity statin MRI studies cancelled due to pacemaker.  Check CT angiogram of the brain and neck instead right thyroid nodule ongoing aggressive stroke risk factor management Patient counseled to be  compliant with her antithrombotic medications  Elevated TSH Management per Medicine team  HYPERTENSION: Stable, Some elevated B/P's noted overnight Permissive hypertension (OK if <220/120) for 24-48 hours post stroke and then gradually normalized within 5-7 days. Long term BP goal normotensive. May slowly restart home B/P medications after 48 hours Home Meds: Norvasc, Metoprolol,   HYPERLIPIDEMIA:    Component Value Date/Time   CHOL 361 (H) 11/04/2017 0742   TRIG 230 (H) 11/04/2017 0742   HDL 32 (L) 11/04/2017 0742   CHOLHDL 11.3 11/04/2017 0742   VLDL 46 (H) 11/04/2017 0742   LDLCALC 283 (H) 11/04/2017 0742  Home Meds:  Crestor 40 mg LDL  goal < 70 Started on Lipitor to 80 mg daily Continue statin at discharge  DIABETES: Lab Results  Component Value Date   HGBA1C 7.1 (H) 11/01/2017  HgbA1c goal < 7.0 Currently [id number] Continue CBG monitoring and SSI DM education   Other Active Problems: Principal Problem:   CVA (cerebral vascular accident) ([place]) Active Problems:   Hypothyroidism   Diabetes ([place])    HYPERLIPIDEMIA   Essential hypertension   Abnormal involuntary movement   MCI (mild cognitive impairment) with memory loss   Weakness   Stroke [facility])  Hospital day # 1  VTE prophylaxis: Lovenox  Diet : Diet full liquid Room service appropriate? Yes; Fluid consistency: Thin   Prior Home Stroke Medications:  aspirin 81 mg daily, clopidogrel 75 mg daily and Crestor 40 mg  Hospital Current Stroke Medications: ASA 81 mg, Plavix 75 mg and Lipitor 80 mg Stroke New Meds Plan: Now on aspirin 81 mg daily, clopidogrel 75 mg daily and Lipitor 80 mg  Discharge Stroke Meds: Please discharge patient on aspirin 81 mg daily, clopidogrel 75 mg daily and Lipitor 80 mg   Disposition: 01-Home or Self Care Therapy Recs:  SNF Follow Recs:  Follow-up Information    Garvin Fila, MD. Schedule an appointment as soon as possible for a visit in 6 week(s).   Specialties:  Neurology, Radiology Contact information: [address] [place] [address] [phone]          Binnie Rail, MD-PCP in 2-3 weeks  FAMILY UPDATES: No family at bedside  TEAM UPDATES: Mariel Aloe, MD    I have personally examined this patient, reviewed notes, independently viewed imaging studies, participated in medical decision making and plan of care.ROS completed by me personally and pertinent positives fully documented  I have made any additions or clarifications directly to the above note.  Check carotid ultrasound and if moderate to severe left ICA stenosis is confirmed may need referral to vascular surgery for carotid revascularization. Continue antiplatelet therapy and aggressive risk factor modification.  Physical occupational therapy and rehab consults.  Discussed with patient and Dr. Lonny Prude and answered questions.  Greater than 50% time during this 25-minute visit was spent in counseling and coordination of care about her stroke and discussion about prevention and treatment options.  Antony Contras,  MD Medical Director [facility] Pager: [phone] 11/05/2017 12:52 PM   To contact Stroke Continuity provider, please refer to [email or web address] After hours, contact General Neurology

## 2017-11-05 NOTE — Progress Notes (Signed)
PROGRESS NOTE    Janet Steele  YJE:[id number] DOB: July 02, 1929 DOA: 11/03/2017 PCP: Binnie Rail, MD    Brief Narrative: Janet Steele is a 83 y.o. female with a history of diabetes mellitus, PVD, status post pacemaker, CAD, hypothyroidism, hypertension, hyperlipidemia, GERD, diastolic heart dysfunction.  She presented with lower extremity weakness and was found to have a likely stroke.   Assessment & Plan:   Principal Problem:   CVA (cerebral vascular accident) ([place]) Active Problems:   Hypothyroidism   Diabetes ([place])   HYPERLIPIDEMIA   Essential hypertension   Abnormal involuntary movement   MCI (mild cognitive impairment) with memory loss   Weakness   Stroke [facility])   Stroke Subacute from CT per neurology read.  Patient initially passed swallow evaluation by speech therapy, but started to display symptoms of dysphasia.  Patient placed on full liquid diet  SLP. LDL significantly elevated at 283. Unsure if patient is adherent with medications. She is also on aspirin and Plavix as an outpatient. EF of 60-65% without evidence of emboli. CTA head/neck significant for acute perforator type infarct of posterior left lentiform nucleus/corona radiata and 50-56% stenosis of right and left ICA respectively -neurology recommendations: carotid ultrasound -PT/OT: SNF -continue aspirin and Plavix -continue Crestor  Mild dementia Per PCP records.   Hypothyroidism -Continue Synthroid  Involuntary movements Chronic.  Essential hypertension Permissive hypertension in the setting of stroke, although it appears this is subacute.  Lopressor, Norvasc, and Cozaar held on admission.  Will await neurology recommendations for restarting blood pressure medications.  Hyperlipidemia -Continue Crestor  Diabetes mellitus Patient on Amaryl and Januvia as an outpatient.  Held on admission. -Continue sliding scale insulin, sensitive  Mood disorder -Continue Cymbalta  GERD -Continue  Protonix   DVT prophylaxis: Lovenox Code Status: Full code Family Communication: None at bedside Disposition Plan: Pending PT/OT   Consultants:   Neurology/Stroke team  Procedures:   None  Antimicrobials:  None    Subjective: No overnight events  Objective: Vitals:   11/04/17 2102 11/04/17 2338 11/05/17 0639 11/05/17 1005  BP: 122/63 (!) 166/66 120/60 (!) 110/36  Pulse: 60 63 65 62  Resp: 18  18 18   Temp: 97.9 F (36.6 C) 98.5 F (36.9 C) 98.1 F (36.7 C) 98.9 F (37.2 C)  TempSrc: Oral Oral Oral Oral  SpO2: 97% 94% 95% 97%  Weight:      Height:        Intake/Output Summary (Last 24 hours) at 11/05/2017 1310 Last data filed at 11/05/2017 0400 Gross per 24 hour  Intake 1220 ml  Output 200 ml  Net 1020 ml   Filed Weights   11/04/17 0102  Weight: 57.3 kg (126 lb 5.2 oz)    Examination:  General exam: Appears calm and comfortable   Data Reviewed: I have personally reviewed following labs and imaging studies  CBC: Recent Labs  Lab 11/03/17 1549  WBC 7.8  NEUTROABS 5.8  HGB 13.9  HCT 40.9  MCV 81.6  PLT 637   Basic Metabolic Panel: Recent Labs  Lab 11/01/17 1702 11/03/17 1549  NA 135 136  K 4.5 3.8  CL 99 100*  CO2 26 26  GLUCOSE 259* 172*  BUN 19 13  CREATININE 1.06 0.94  CALCIUM 9.7 9.2   GFR: Estimated Creatinine Clearance: 34 mL/min (by C-G formula based on SCr of 0.94 mg/dL). Liver Function Tests: Recent Labs  Lab 11/01/17 1702 11/03/17 1549  AST 15 24  ALT 11 16  ALKPHOS 103 117  BILITOT 0.5 1.2  PROT 7.6 7.8  ALBUMIN 4.2 4.3   No results for input(s): LIPASE, AMYLASE in the last 168 hours. No results for input(s): AMMONIA in the last 168 hours. Coagulation Profile: No results for input(s): INR, PROTIME in the last 168 hours. Cardiac Enzymes: Recent Labs  Lab 11/03/17 1549  CKTOTAL 209  TROPONINI <0.03   BNP (last 3 results) No results for input(s): PROBNP in the last 8760 hours. HbA1C: No results for  input(s): HGBA1C in the last 72 hours. CBG: Recent Labs  Lab 11/04/17 1103 11/04/17 1630 11/04/17 2149 11/05/17 0658 11/05/17 1156  GLUCAP 182* 159* 138* 134* 175*   Lipid Profile: Recent Labs    11/04/17 0742  CHOL 361*  HDL 32*  LDLCALC 283*  TRIG 230*  CHOLHDL 11.3   Thyroid Function Tests: Recent Labs    11/03/17 1549  TSH 18.878*   Anemia Panel: No results for input(s): VITAMINB12, FOLATE, FERRITIN, TIBC, IRON, RETICCTPCT in the last 72 hours. Sepsis Labs: No results for input(s): PROCALCITON, LATICACIDVEN in the last 168 hours.  Recent Results (from the past 240 hour(s))  Urine Culture     Status: Abnormal   Collection Time: 11/03/17  3:36 PM  Result Value Ref Range Status   Specimen Description URINE, CATHETERIZED  Final   Special Requests NONE  Final   Culture (A)  Final    <10,000 COLONIES/mL INSIGNIFICANT GROWTH Performed at [facility], [address] [place], [place] [address]    Report Status 11/04/2017 FINAL  Final  MRSA PCR Screening     Status: None   Collection Time: 11/04/17  1:55 AM  Result Value Ref Range Status   MRSA by PCR NEGATIVE NEGATIVE Final    Comment:        The GeneXpert MRSA Assay (FDA approved for NASAL specimens only), is one component of a comprehensive MRSA colonization surveillance program. It is not intended to diagnose MRSA infection nor to guide or monitor treatment for MRSA infections.          Radiology Studies: Ct Angio Head W Or Wo Contrast  Result Date: 11/04/2017 CLINICAL DATA:  Follow-up examination for acute stroke, right-sided weakness. EXAM: CT ANGIOGRAPHY HEAD AND NECK TECHNIQUE: Multidetector CT imaging of the head and neck was performed using the standard protocol during bolus administration of intravenous contrast. Multiplanar CT image reconstructions and MIPs were obtained to evaluate the vascular anatomy. Carotid stenosis measurements (when applicable) are obtained utilizing NASCET  criteria, using the distal internal carotid diameter as the denominator. CONTRAST:  32mL ISOVUE-370 IOPAMIDOL (ISOVUE-370) INJECTION 76% COMPARISON:  Prior CT from 11/03/2017. FINDINGS: CT HEAD FINDINGS Brain: Stable atrophy with chronic small vessel ischemic disease. Remote lacunar infarct again noted within the anterior left centrum semi ovale. Curvilinear hypodensity extending from the posterior left lentiform nucleus towards the left corona radiata noted, most likely reflects an evolving acute ischemic perforator type infarct (series 5, image 17). No associated hemorrhage or mass effect. No other acute intracranial hemorrhage. No other acute or evolving large vessel territory infarct. No mass lesion, midline shift or mass effect. No hydrocephalus. No extra-axial fluid collection. Vascular: No hyperdense vessel. Scattered vascular calcifications noted within the carotid siphons. Skull: Scalp soft tissues and calvarium within normal limits. Sinuses: Visualized paranasal sinuses are largely clear. Mastoid air cells are clear. Orbits: Globes and orbital soft tissues within normal limits. Patient status post lens extraction bilaterally. Review of the MIP images confirms the above findings CTA NECK FINDINGS Aortic  arch: Visualized aortic arch of normal caliber with normal 3 vessel morphology. Scattered atheromatous plaque throughout the visualize arch and proximal great vessels. No hemodynamically significant stenosis at the origin of the great vessels. Visualized subclavian artery is patent without flow-limiting stenosis. Right carotid system: The right common carotid artery tortuous proximally but patent to the bifurcation without flow-limiting stenosis. Eccentric calcified plaque about the origin of the right ICA and associated short-segment stenosis of up to 50% by NASCET criteria. Right ICA otherwise widely patent distally to the skullbase without stenosis, dissection, or occlusion. Left carotid system: Left  common carotid artery mildly tortuous proximally but is widely patent to the bifurcation without stenosis. Prominent calcified plaque about the left carotid bifurcation/proximal left ICA. Associated short-segment stenosis of up to 65% by NASCET criteria. Left ICA otherwise patent to the skullbase without stenosis, dissection, or occlusion. Vertebral arteries: Both of the vertebral artery is arise from the subclavian arteries. Vertebral artery is largely code dominant. Focal plaque at the origin of the right vertebral artery with relatively mild stenosis. Vertebral arteries otherwise widely patent within the neck without stenosis, dissection, or occlusion. Skeleton: No acute osseus abnormality. No worrisome lytic or blastic osseous lesions. Moderate degenerative spondylolysis noted at C4-5 through C6-7. Other neck: No acute soft tissue abnormality identified within the neck. No adenopathy. Salivary glands within normal limits. Thyroid appears to be absent. Upper chest: Visualized upper mediastinum within normal limits. Left-sided pacemaker/ AICD partially visualized. Median sternotomy wires noted. Partially visualized lungs are clear. Review of the MIP images confirms the above findings CTA HEAD FINDINGS Anterior circulation: Examination severely limited by extensive motion artifact. Petrous ICAs patent bilaterally without definite stenosis. Calcified atheromatous plaque seen diffusely throughout the cavernous/supraclinoid ICAs without obvious flow-limiting stenosis, although evaluation limited by motion artifact. ICA termini grossly patent bilaterally. Grossly patent A1 segments. Anterior communicating artery not well evaluated due to motion. Anterior cerebral arteries grossly patent to their distal aspects with symmetric enhancement. M1 segments patent bilaterally without obvious flow-limiting stenosis. Grossly normal MCA bifurcations. No obvious proximal M2 occlusion. Distal MCA branches well perfused bilaterally  and grossly symmetric. Posterior circulation: Mild atheromatous regularity within the V4 segments bilaterally without flow-limiting stenosis. Posterior inferior cerebral artery is patent bilaterally. Basilar artery widely patent proximally, poorly evaluated at its mid and distal aspect due to motion artifact. No obvious flow-limiting stenosis. Superior cerebral arteries patent bilaterally. Evaluation of the PCAs markedly limited by motion, although are grossly patent to their distal aspects without occlusion or obvious stenosis. Venous sinuses: Grossly patent, although evaluation markedly limited by motion. Anatomic variants: None significant. Delayed phase: No abnormal enhancement. Review of the MIP images confirms the above findings IMPRESSION: 1. Probable evolving acute perforator type infarct involving the posterior left lentiform nucleus/corona radiata. No associated hemorrhage or mass effect. 2. Atheromatous plaque about the carotid bifurcations bilaterally. Associated stenosis of up to 50% at the origin of the right ICA, and up to 65% at the origin of the left ICA. 3. Severely limited evaluation of the intracranial circulation due to extensive motion artifact. No large vessel occlusion. No obvious high-grade or flow-limiting stenosis. Electronically Signed   By: Jeannine Boga M.D.   On: 11/04/2017 23:17   Dg Chest 2 View  Result Date: 11/03/2017 CLINICAL DATA:  83 y/o  F; 1 day weakness and increased urination. EXAM: CHEST  2 VIEW COMPARISON:  03/11/2017 chest radiograph FINDINGS: Stable normal cardiac silhouette. 2 lead pacemaker. Post median sternotomy with wires in alignment. Aortic atherosclerosis with calcification. Stable  chronic bronchitic changes. No focal consolidation. No pleural effusion or pneumothorax. No acute osseous abnormality is evident. IMPRESSION: COPD. No acute pulmonary process identified. Stable cardiac silhouette. Electronically Signed   By: Kristine Garbe M.D.    On: 11/03/2017 16:32   Ct Head Wo Contrast  Result Date: 11/03/2017 CLINICAL DATA:  Subacute neuro deficits. Weakness and foul smelling urine. EXAM: CT HEAD WITHOUT CONTRAST TECHNIQUE: Contiguous axial images were obtained from the base of the skull through the vertex without intravenous contrast. COMPARISON:  11/01/2017 FINDINGS: Brain: There is a wedge-shaped low-density in the left putamen extending to the caudate on coronal reformats. This is somewhat indistinct and and is age-indeterminate. More well-defined low-density lacunar infarct in the left centrum semiovale. No hemorrhage or hydrocephalus. No masslike finding. Vascular: Arterial calcification.  No hyperdense vessel. Skull: No acute or aggressive finding. Sinuses/Orbits: Bilateral cataract resection. These results were called by telephone at the time of interpretation on 11/03/2017 at 7:21 pm to Dr. Lacretia Leigh , who verbally acknowledged these results. IMPRESSION: 1. Age-indeterminate perforator infarct in the left basal ganglia. 2. Remote lacunar infarct in the left centrum semiovale. Electronically Signed   By: Monte Fantasia M.D.   On: 11/03/2017 19:22   Ct Angio Neck W Or Wo Contrast  Result Date: 11/04/2017 CLINICAL DATA:  Follow-up examination for acute stroke, right-sided weakness. EXAM: CT ANGIOGRAPHY HEAD AND NECK TECHNIQUE: Multidetector CT imaging of the head and neck was performed using the standard protocol during bolus administration of intravenous contrast. Multiplanar CT image reconstructions and MIPs were obtained to evaluate the vascular anatomy. Carotid stenosis measurements (when applicable) are obtained utilizing NASCET criteria, using the distal internal carotid diameter as the denominator. CONTRAST:  73mL ISOVUE-370 IOPAMIDOL (ISOVUE-370) INJECTION 76% COMPARISON:  Prior CT from 11/03/2017. FINDINGS: CT HEAD FINDINGS Brain: Stable atrophy with chronic small vessel ischemic disease. Remote lacunar infarct again noted  within the anterior left centrum semi ovale. Curvilinear hypodensity extending from the posterior left lentiform nucleus towards the left corona radiata noted, most likely reflects an evolving acute ischemic perforator type infarct (series 5, image 17). No associated hemorrhage or mass effect. No other acute intracranial hemorrhage. No other acute or evolving large vessel territory infarct. No mass lesion, midline shift or mass effect. No hydrocephalus. No extra-axial fluid collection. Vascular: No hyperdense vessel. Scattered vascular calcifications noted within the carotid siphons. Skull: Scalp soft tissues and calvarium within normal limits. Sinuses: Visualized paranasal sinuses are largely clear. Mastoid air cells are clear. Orbits: Globes and orbital soft tissues within normal limits. Patient status post lens extraction bilaterally. Review of the MIP images confirms the above findings CTA NECK FINDINGS Aortic arch: Visualized aortic arch of normal caliber with normal 3 vessel morphology. Scattered atheromatous plaque throughout the visualize arch and proximal great vessels. No hemodynamically significant stenosis at the origin of the great vessels. Visualized subclavian artery is patent without flow-limiting stenosis. Right carotid system: The right common carotid artery tortuous proximally but patent to the bifurcation without flow-limiting stenosis. Eccentric calcified plaque about the origin of the right ICA and associated short-segment stenosis of up to 50% by NASCET criteria. Right ICA otherwise widely patent distally to the skullbase without stenosis, dissection, or occlusion. Left carotid system: Left common carotid artery mildly tortuous proximally but is widely patent to the bifurcation without stenosis. Prominent calcified plaque about the left carotid bifurcation/proximal left ICA. Associated short-segment stenosis of up to 65% by NASCET criteria. Left ICA otherwise patent to the skullbase without  stenosis, dissection, or  occlusion. Vertebral arteries: Both of the vertebral artery is arise from the subclavian arteries. Vertebral artery is largely code dominant. Focal plaque at the origin of the right vertebral artery with relatively mild stenosis. Vertebral arteries otherwise widely patent within the neck without stenosis, dissection, or occlusion. Skeleton: No acute osseus abnormality. No worrisome lytic or blastic osseous lesions. Moderate degenerative spondylolysis noted at C4-5 through C6-7. Other neck: No acute soft tissue abnormality identified within the neck. No adenopathy. Salivary glands within normal limits. Thyroid appears to be absent. Upper chest: Visualized upper mediastinum within normal limits. Left-sided pacemaker/ AICD partially visualized. Median sternotomy wires noted. Partially visualized lungs are clear. Review of the MIP images confirms the above findings CTA HEAD FINDINGS Anterior circulation: Examination severely limited by extensive motion artifact. Petrous ICAs patent bilaterally without definite stenosis. Calcified atheromatous plaque seen diffusely throughout the cavernous/supraclinoid ICAs without obvious flow-limiting stenosis, although evaluation limited by motion artifact. ICA termini grossly patent bilaterally. Grossly patent A1 segments. Anterior communicating artery not well evaluated due to motion. Anterior cerebral arteries grossly patent to their distal aspects with symmetric enhancement. M1 segments patent bilaterally without obvious flow-limiting stenosis. Grossly normal MCA bifurcations. No obvious proximal M2 occlusion. Distal MCA branches well perfused bilaterally and grossly symmetric. Posterior circulation: Mild atheromatous regularity within the V4 segments bilaterally without flow-limiting stenosis. Posterior inferior cerebral artery is patent bilaterally. Basilar artery widely patent proximally, poorly evaluated at its mid and distal aspect due to motion  artifact. No obvious flow-limiting stenosis. Superior cerebral arteries patent bilaterally. Evaluation of the PCAs markedly limited by motion, although are grossly patent to their distal aspects without occlusion or obvious stenosis. Venous sinuses: Grossly patent, although evaluation markedly limited by motion. Anatomic variants: None significant. Delayed phase: No abnormal enhancement. Review of the MIP images confirms the above findings IMPRESSION: 1. Probable evolving acute perforator type infarct involving the posterior left lentiform nucleus/corona radiata. No associated hemorrhage or mass effect. 2. Atheromatous plaque about the carotid bifurcations bilaterally. Associated stenosis of up to 50% at the origin of the right ICA, and up to 65% at the origin of the left ICA. 3. Severely limited evaluation of the intracranial circulation due to extensive motion artifact. No large vessel occlusion. No obvious high-grade or flow-limiting stenosis. Electronically Signed   By: Jeannine Boga M.D.   On: 11/04/2017 23:17        Scheduled Meds: . aspirin EC  81 mg Oral Daily  . atorvastatin  80 mg Oral q1800  . clopidogrel  75 mg Oral Daily  . DULoxetine  60 mg Oral Daily  . enoxaparin (LOVENOX) injection  30 mg Subcutaneous Q24H  . feeding supplement (ENSURE ENLIVE)  237 mL Oral BID BM  . gabapentin  100 mg Oral QHS  . insulin aspart  0-9 Units Subcutaneous TID WC  . levothyroxine  150 mcg Oral QAC breakfast  . pantoprazole  40 mg Oral BID  . traZODone  100 mg Oral QHS   Continuous Infusions: . sodium chloride 50 mL/hr at 11/05/17 0738     LOS: 1 day     Cordelia Poche, MD [facility] 11/05/2017, 1:10 PM Pager: [phone]  If 7PM-7AM, please contact night-coverage [email or web address] Password [email or web address] 11/05/2017, 1:10 PM

## 2017-11-06 ENCOUNTER — Inpatient Hospital Stay (HOSPITAL_COMMUNITY): Payer: Medicare Other

## 2017-11-06 DIAGNOSIS — I6300 Cerebral infarction due to thrombosis of unspecified precerebral artery: Secondary | ICD-10-CM

## 2017-11-06 LAB — GLUCOSE, CAPILLARY
GLUCOSE-CAPILLARY: 173 mg/dL — AB (ref 65–99)
GLUCOSE-CAPILLARY: 124 mg/dL — AB (ref 65–99)
Glucose-Capillary: 128 mg/dL — ABNORMAL HIGH (ref 65–99)
Glucose-Capillary: 141 mg/dL — ABNORMAL HIGH (ref 65–99)
Glucose-Capillary: 206 mg/dL — ABNORMAL HIGH (ref 65–99)

## 2017-11-06 NOTE — Progress Notes (Signed)
PROGRESS NOTE    DAYA DUTT  DEY:[id number] DOB: [birth year] DOA: 11/03/2017 PCP: Binnie Rail, MD    Brief Narrative: Janet Steele is a 83 y.o. female with a history of diabetes mellitus, PVD, status post pacemaker, CAD, hypothyroidism, hypertension, hyperlipidemia, GERD, diastolic heart dysfunction.  She presented with lower extremity weakness and was found to have a likely stroke.   Assessment & Plan:   Principal Problem:   CVA (cerebral vascular accident) ([place]) Active Problems:   Hypothyroidism   Diabetes ([place])   HYPERLIPIDEMIA   Essential hypertension   Abnormal involuntary movement   MCI (mild cognitive impairment) with memory loss   Weakness   Stroke [facility])   Stroke Subacute from CT per neurology read.  Patient initially passed swallow evaluation by speech therapy, but started to display symptoms of dysphasia.  Patient placed on full liquid diet  SLP. LDL significantly elevated at 283. Unsure if patient is adherent with medications. She is also on aspirin and Plavix as an outpatient. EF of 60-65% without evidence of emboli. CTA head/neck significant for acute perforator type infarct of posterior left lentiform nucleus/corona radiata and 50-55% stenosis of right and left ICA respectively -neurology recommendations: carotid ultrasound pending -PT/OT: SNF -continue aspirin and Plavix -continue Crestor  Mild dementia Per PCP records.   Hypothyroidism TSH of 18. Unsure if patient has been adherent with regimen. Already on a high dose. -Continue Synthroid -Recheck TSH as an outpatient. Patient needs to be consistent with regimen.  Involuntary movements Chronic.  Essential hypertension Permissive hypertension in the setting of stroke, although it appears this is subacute.  Lopressor, Norvasc, and Cozaar held on admission.  Will await neurology recommendations for restarting blood pressure medications.  Hyperlipidemia -Continue Crestor  Diabetes  mellitus Patient on Amaryl and Januvia as an outpatient.  Held on admission. -Continue sliding scale insulin, sensitive  Mood disorder -Continue Cymbalta  GERD -Continue Protonix   DVT prophylaxis: Lovenox Code Status: Full code Family Communication: None at bedside. None available on telephone. Disposition Plan: Pending PT/OT   Consultants:   Neurology/Stroke team  Procedures:   None  Antimicrobials:  None    Subjective: No concerns  Objective: Vitals:   11/05/17 2100 11/06/17 0024 11/06/17 0509 11/06/17 0948  BP: 123/66 130/64 (!) 130/59 (!) 114/55  Pulse: 63 65 66 61  Resp: 18 18 18 18   Temp: 98.4 F (36.9 C) 98.4 F (36.9 C) 97.7 F (36.5 C) 98 F (36.7 C)  TempSrc: Oral Oral Oral Oral  SpO2: 97% 96% 96% 97%  Weight:      Height:        Intake/Output Summary (Last 24 hours) at 11/06/2017 1314 Last data filed at 11/06/2017 3149 Gross per 24 hour  Intake 2000 ml  Output 600 ml  Net 1400 ml   Filed Weights   11/04/17 0102  Weight: 57.3 kg (126 lb 5.2 oz)    Examination:  General exam: Appears calm and comfortable Neuro: alert, oriented to person and place. 4/5 RUE weakness compared to 5/5 on left.   Data Reviewed: I have personally reviewed following labs and imaging studies  CBC: Recent Labs  Lab 11/03/17 1549  WBC 7.8  NEUTROABS 5.8  HGB 13.9  HCT 40.9  MCV 81.6  PLT 702   Basic Metabolic Panel: Recent Labs  Lab 11/01/17 1702 11/03/17 1549  NA 135 136  K 4.5 3.8  CL 99 100*  CO2 26 26  GLUCOSE 259* 172*  BUN 19 13  CREATININE  1.06 0.94  CALCIUM 9.7 9.2   GFR: Estimated Creatinine Clearance: 34 mL/min (by C-G formula based on SCr of 0.94 mg/dL). Liver Function Tests: Recent Labs  Lab 11/01/17 1702 11/03/17 1549  AST 15 24  ALT 11 16  ALKPHOS 103 117  BILITOT 0.5 1.2  PROT 7.6 7.8  ALBUMIN 4.2 4.3   No results for input(s): LIPASE, AMYLASE in the last 168 hours. No results for input(s): AMMONIA in the last  168 hours. Coagulation Profile: No results for input(s): INR, PROTIME in the last 168 hours. Cardiac Enzymes: Recent Labs  Lab 11/03/17 1549  CKTOTAL 209  TROPONINI <0.03   BNP (last 3 results) No results for input(s): PROBNP in the last 8760 hours. HbA1C: No results for input(s): HGBA1C in the last 72 hours. CBG: Recent Labs  Lab 11/05/17 1156 11/05/17 1643 11/06/17 0004 11/06/17 0606 11/06/17 1146  GLUCAP 175* 197* 128* 141* 173*   Lipid Profile: Recent Labs    11/04/17 0742  CHOL 361*  HDL 32*  LDLCALC 283*  TRIG 230*  CHOLHDL 11.3   Thyroid Function Tests: Recent Labs    11/03/17 1549  TSH 18.878*   Anemia Panel: No results for input(s): VITAMINB12, FOLATE, FERRITIN, TIBC, IRON, RETICCTPCT in the last 72 hours. Sepsis Labs: No results for input(s): PROCALCITON, LATICACIDVEN in the last 168 hours.  Recent Results (from the past 240 hour(s))  Urine Culture     Status: Abnormal   Collection Time: 11/03/17  3:36 PM  Result Value Ref Range Status   Specimen Description URINE, CATHETERIZED  Final   Special Requests NONE  Final   Culture (A)  Final    <10,000 COLONIES/mL INSIGNIFICANT GROWTH Performed at [facility], [address], [place], [place] [address]    Report Status 11/04/2017 FINAL  Final  MRSA PCR Screening     Status: None   Collection Time: 11/04/17  1:55 AM  Result Value Ref Range Status   MRSA by PCR NEGATIVE NEGATIVE Final    Comment:        The GeneXpert MRSA Assay (FDA approved for NASAL specimens only), is one component of a comprehensive MRSA colonization surveillance program. It is not intended to diagnose MRSA infection nor to guide or monitor treatment for MRSA infections.          Radiology Studies: Ct Angio Head W Or Wo Contrast  Result Date: 11/04/2017 CLINICAL DATA:  Follow-up examination for acute stroke, right-sided weakness. EXAM: CT ANGIOGRAPHY HEAD AND NECK TECHNIQUE: Multidetector CT imaging of the  head and neck was performed using the standard protocol during bolus administration of intravenous contrast. Multiplanar CT image reconstructions and MIPs were obtained to evaluate the vascular anatomy. Carotid stenosis measurements (when applicable) are obtained utilizing NASCET criteria, using the distal internal carotid diameter as the denominator. CONTRAST:  49mL ISOVUE-370 IOPAMIDOL (ISOVUE-370) INJECTION 76% COMPARISON:  Prior CT from 11/03/2017. FINDINGS: CT HEAD FINDINGS Brain: Stable atrophy with chronic small vessel ischemic disease. Remote lacunar infarct again noted within the anterior left centrum semi ovale. Curvilinear hypodensity extending from the posterior left lentiform nucleus towards the left corona radiata noted, most likely reflects an evolving acute ischemic perforator type infarct (series 5, image 17). No associated hemorrhage or mass effect. No other acute intracranial hemorrhage. No other acute or evolving large vessel territory infarct. No mass lesion, midline shift or mass effect. No hydrocephalus. No extra-axial fluid collection. Vascular: No hyperdense vessel. Scattered vascular calcifications noted within the carotid siphons. Skull: Scalp  soft tissues and calvarium within normal limits. Sinuses: Visualized paranasal sinuses are largely clear. Mastoid air cells are clear. Orbits: Globes and orbital soft tissues within normal limits. Patient status post lens extraction bilaterally. Review of the MIP images confirms the above findings CTA NECK FINDINGS Aortic arch: Visualized aortic arch of normal caliber with normal 3 vessel morphology. Scattered atheromatous plaque throughout the visualize arch and proximal great vessels. No hemodynamically significant stenosis at the origin of the great vessels. Visualized subclavian artery is patent without flow-limiting stenosis. Right carotid system: The right common carotid artery tortuous proximally but patent to the bifurcation without  flow-limiting stenosis. Eccentric calcified plaque about the origin of the right ICA and associated short-segment stenosis of up to 50% by NASCET criteria. Right ICA otherwise widely patent distally to the skullbase without stenosis, dissection, or occlusion. Left carotid system: Left common carotid artery mildly tortuous proximally but is widely patent to the bifurcation without stenosis. Prominent calcified plaque about the left carotid bifurcation/proximal left ICA. Associated short-segment stenosis of up to 65% by NASCET criteria. Left ICA otherwise patent to the skullbase without stenosis, dissection, or occlusion. Vertebral arteries: Both of the vertebral artery is arise from the subclavian arteries. Vertebral artery is largely code dominant. Focal plaque at the origin of the right vertebral artery with relatively mild stenosis. Vertebral arteries otherwise widely patent within the neck without stenosis, dissection, or occlusion. Skeleton: No acute osseus abnormality. No worrisome lytic or blastic osseous lesions. Moderate degenerative spondylolysis noted at C4-5 through C6-7. Other neck: No acute soft tissue abnormality identified within the neck. No adenopathy. Salivary glands within normal limits. Thyroid appears to be absent. Upper chest: Visualized upper mediastinum within normal limits. Left-sided pacemaker/ AICD partially visualized. Median sternotomy wires noted. Partially visualized lungs are clear. Review of the MIP images confirms the above findings CTA HEAD FINDINGS Anterior circulation: Examination severely limited by extensive motion artifact. Petrous ICAs patent bilaterally without definite stenosis. Calcified atheromatous plaque seen diffusely throughout the cavernous/supraclinoid ICAs without obvious flow-limiting stenosis, although evaluation limited by motion artifact. ICA termini grossly patent bilaterally. Grossly patent A1 segments. Anterior communicating artery not well evaluated due to  motion. Anterior cerebral arteries grossly patent to their distal aspects with symmetric enhancement. M1 segments patent bilaterally without obvious flow-limiting stenosis. Grossly normal MCA bifurcations. No obvious proximal M2 occlusion. Distal MCA branches well perfused bilaterally and grossly symmetric. Posterior circulation: Mild atheromatous regularity within the V4 segments bilaterally without flow-limiting stenosis. Posterior inferior cerebral artery is patent bilaterally. Basilar artery widely patent proximally, poorly evaluated at its mid and distal aspect due to motion artifact. No obvious flow-limiting stenosis. Superior cerebral arteries patent bilaterally. Evaluation of the PCAs markedly limited by motion, although are grossly patent to their distal aspects without occlusion or obvious stenosis. Venous sinuses: Grossly patent, although evaluation markedly limited by motion. Anatomic variants: None significant. Delayed phase: No abnormal enhancement. Review of the MIP images confirms the above findings IMPRESSION: 1. Probable evolving acute perforator type infarct involving the posterior left lentiform nucleus/corona radiata. No associated hemorrhage or mass effect. 2. Atheromatous plaque about the carotid bifurcations bilaterally. Associated stenosis of up to 50% at the origin of the right ICA, and up to 65% at the origin of the left ICA. 3. Severely limited evaluation of the intracranial circulation due to extensive motion artifact. No large vessel occlusion. No obvious high-grade or flow-limiting stenosis. Electronically Signed   By: Jeannine Boga M.D.   On: 11/04/2017 23:17   Ct Angio Neck W  Or Wo Contrast  Result Date: 11/04/2017 CLINICAL DATA:  Follow-up examination for acute stroke, right-sided weakness. EXAM: CT ANGIOGRAPHY HEAD AND NECK TECHNIQUE: Multidetector CT imaging of the head and neck was performed using the standard protocol during bolus administration of intravenous  contrast. Multiplanar CT image reconstructions and MIPs were obtained to evaluate the vascular anatomy. Carotid stenosis measurements (when applicable) are obtained utilizing NASCET criteria, using the distal internal carotid diameter as the denominator. CONTRAST:  44mL ISOVUE-370 IOPAMIDOL (ISOVUE-370) INJECTION 76% COMPARISON:  Prior CT from 11/03/2017. FINDINGS: CT HEAD FINDINGS Brain: Stable atrophy with chronic small vessel ischemic disease. Remote lacunar infarct again noted within the anterior left centrum semi ovale. Curvilinear hypodensity extending from the posterior left lentiform nucleus towards the left corona radiata noted, most likely reflects an evolving acute ischemic perforator type infarct (series 5, image 17). No associated hemorrhage or mass effect. No other acute intracranial hemorrhage. No other acute or evolving large vessel territory infarct. No mass lesion, midline shift or mass effect. No hydrocephalus. No extra-axial fluid collection. Vascular: No hyperdense vessel. Scattered vascular calcifications noted within the carotid siphons. Skull: Scalp soft tissues and calvarium within normal limits. Sinuses: Visualized paranasal sinuses are largely clear. Mastoid air cells are clear. Orbits: Globes and orbital soft tissues within normal limits. Patient status post lens extraction bilaterally. Review of the MIP images confirms the above findings CTA NECK FINDINGS Aortic arch: Visualized aortic arch of normal caliber with normal 3 vessel morphology. Scattered atheromatous plaque throughout the visualize arch and proximal great vessels. No hemodynamically significant stenosis at the origin of the great vessels. Visualized subclavian artery is patent without flow-limiting stenosis. Right carotid system: The right common carotid artery tortuous proximally but patent to the bifurcation without flow-limiting stenosis. Eccentric calcified plaque about the origin of the right ICA and associated  short-segment stenosis of up to 50% by NASCET criteria. Right ICA otherwise widely patent distally to the skullbase without stenosis, dissection, or occlusion. Left carotid system: Left common carotid artery mildly tortuous proximally but is widely patent to the bifurcation without stenosis. Prominent calcified plaque about the left carotid bifurcation/proximal left ICA. Associated short-segment stenosis of up to 65% by NASCET criteria. Left ICA otherwise patent to the skullbase without stenosis, dissection, or occlusion. Vertebral arteries: Both of the vertebral artery is arise from the subclavian arteries. Vertebral artery is largely code dominant. Focal plaque at the origin of the right vertebral artery with relatively mild stenosis. Vertebral arteries otherwise widely patent within the neck without stenosis, dissection, or occlusion. Skeleton: No acute osseus abnormality. No worrisome lytic or blastic osseous lesions. Moderate degenerative spondylolysis noted at C4-5 through C6-7. Other neck: No acute soft tissue abnormality identified within the neck. No adenopathy. Salivary glands within normal limits. Thyroid appears to be absent. Upper chest: Visualized upper mediastinum within normal limits. Left-sided pacemaker/ AICD partially visualized. Median sternotomy wires noted. Partially visualized lungs are clear. Review of the MIP images confirms the above findings CTA HEAD FINDINGS Anterior circulation: Examination severely limited by extensive motion artifact. Petrous ICAs patent bilaterally without definite stenosis. Calcified atheromatous plaque seen diffusely throughout the cavernous/supraclinoid ICAs without obvious flow-limiting stenosis, although evaluation limited by motion artifact. ICA termini grossly patent bilaterally. Grossly patent A1 segments. Anterior communicating artery not well evaluated due to motion. Anterior cerebral arteries grossly patent to their distal aspects with symmetric  enhancement. M1 segments patent bilaterally without obvious flow-limiting stenosis. Grossly normal MCA bifurcations. No obvious proximal M2 occlusion. Distal MCA branches well perfused bilaterally and  grossly symmetric. Posterior circulation: Mild atheromatous regularity within the V4 segments bilaterally without flow-limiting stenosis. Posterior inferior cerebral artery is patent bilaterally. Basilar artery widely patent proximally, poorly evaluated at its mid and distal aspect due to motion artifact. No obvious flow-limiting stenosis. Superior cerebral arteries patent bilaterally. Evaluation of the PCAs markedly limited by motion, although are grossly patent to their distal aspects without occlusion or obvious stenosis. Venous sinuses: Grossly patent, although evaluation markedly limited by motion. Anatomic variants: None significant. Delayed phase: No abnormal enhancement. Review of the MIP images confirms the above findings IMPRESSION: 1. Probable evolving acute perforator type infarct involving the posterior left lentiform nucleus/corona radiata. No associated hemorrhage or mass effect. 2. Atheromatous plaque about the carotid bifurcations bilaterally. Associated stenosis of up to 50% at the origin of the right ICA, and up to 65% at the origin of the left ICA. 3. Severely limited evaluation of the intracranial circulation due to extensive motion artifact. No large vessel occlusion. No obvious high-grade or flow-limiting stenosis. Electronically Signed   By: Jeannine Boga M.D.   On: 11/04/2017 23:17        Scheduled Meds: . aspirin EC  81 mg Oral Daily  . atorvastatin  80 mg Oral q1800  . clopidogrel  75 mg Oral Daily  . DULoxetine  60 mg Oral Daily  . enoxaparin (LOVENOX) injection  30 mg Subcutaneous Q24H  . feeding supplement (ENSURE ENLIVE)  237 mL Oral BID BM  . gabapentin  100 mg Oral QHS  . insulin aspart  0-9 Units Subcutaneous TID WC  . levothyroxine  150 mcg Oral QAC breakfast   . pantoprazole  40 mg Oral BID  . traZODone  100 mg Oral QHS   Continuous Infusions: . sodium chloride 50 mL/hr at 11/05/17 1815     LOS: 2 days     Cordelia Poche, MD [facility] 11/06/2017, 1:14 PM Pager: [phone]  If 7PM-7AM, please contact night-coverage [email or web address] Password [email or web address] 11/06/2017, 1:14 PM

## 2017-11-06 NOTE — Progress Notes (Signed)
STROKE TEAM PROGRESS NOTE  Admission History: Janet Steele is a 83 y.o. female who states that she has been having right-sided weakness for the past 2 days.  She states that she has noted that her right arm is not coordinated, and she has been having trouble standing on her right leg.  For that reason she presented to the emergency room where a CT was performed which shows a age-indeterminate but likely subacute infarct in the left basal ganglia. While walking the room, she asked me "why my under scrutiny!?"  When I tell her that she is being evaluated for leg weakness she responds with "well, only one is weak!"  LKW: 11/27 tpa given?: no, outside of window  SUBJECTIVE (INTERVAL HISTORY)  No family is at the bedside. Patient is found laying in bed in NAD  Overall she feels her condition is stable. Still complaining of right arm weakness but slightly improved. Voices no new complaints. No new events reported overnight. Therapy recommendations are SNFand case manager is working on it. OBJECTIVE Lab Results: CBC:  Recent Labs  Lab 11/03/17 1549  WBC 7.8  HGB 13.9  HCT 40.9  MCV 81.6  PLT 289   BMP: Recent Labs  Lab 11/01/17 1702 11/03/17 1549  NA 135 136  K 4.5 3.8  CL 99 100*  CO2 26 26  GLUCOSE 259* 172*  BUN 19 13  CREATININE 1.06 0.94  CALCIUM 9.7 9.2   Liver Function Tests:  Recent Labs  Lab 11/01/17 1702 11/03/17 1549  AST 15 24  ALT 11 16  ALKPHOS 103 117  BILITOT 0.5 1.2  PROT 7.6 7.8  ALBUMIN 4.2 4.3   Thyroid Function Studies:  Recent Labs    11/03/17 1549  TSH 18.878*   Cardiac Enzymes:  Recent Labs  Lab 11/03/17 1549  CKTOTAL 209  TROPONINI <0.03   Recent Labs  Lab 11/03/17 1535  COLORURINE YELLOW  APPEARANCEUR CLEAR  LABSPEC 1.017  PHURINE 5.0  GLUCOSEU 50*  HGBUR NEGATIVE  BILIRUBINUR NEGATIVE  KETONESUR NEGATIVE  PROTEINUR NEGATIVE  NITRITE NEGATIVE  LEUKOCYTESUR SMALL*    PHYSICAL EXAM Temp:  [97.7 F (36.5 C)-98.4 F  (36.9 C)] 98 F (36.7 C) (12/02 0948) Pulse Rate:  [61-68] 61 (12/02 0948) Resp:  [18] 18 (12/02 0948) BP: (114-130)/(55-66) 114/55 (12/02 0948) SpO2:  [96 %-98 %] 97 % (12/02 0948) General - Well nourished, well developed elderly Caucasian lady, in no apparent distress Respiratory - Lungs clear bilaterally. No wheezing. Cardiovascular - Regular rate and rhythm   Neuro: Mental Status: Patient is awake, alert, oriented to person,  Place, not oriented to month or year  patient is able to give a clear and coherent history. No signs of aphasia or neglect She appears to have mild psychomotor agitation. Cranial Nerves: II: Visual Fields are full. Pupils are equal, round, and reactive to light.   III,IV, VI: EOMI without ptosis or diploplia.  V: Facial sensation is symmetric to temperature VII: Facial movement is symmetric.  VIII: hearing is intact to voice X: Uvula elevates symmetrically XI: Shoulder shrug is symmetric. XII: tongue is midline without atrophy or fasciculations.  Motor: Tone is normal. Bulk is normal. 4/5 weakness of the right arm and leg with motor coordination out of proportion to weakness. Sensory: Sensation is symmetric to light touch and temperature in the arms, decreased in the right leg Deep Tendon Reflexes: 3+ and symmetric in the biceps and patellae with spread at the patella Cerebellar: She has incoordination to  date cardiomegaly of  the right arm and  minimally leg.  IMAGING: I have personally reviewed the radiological images below and agree with the radiology interpretations. Dg Chest 2 View  Result Date: 11/03/2017 CLINICAL DATA:  83 y/o  F; 1 day weakness and increased urination. EXAM: CHEST  2 VIEW COMPARISON:  03/11/2017 chest radiograph FINDINGS: Stable normal cardiac silhouette. 2 lead pacemaker. Post median sternotomy with wires in alignment. Aortic atherosclerosis with calcification. Stable chronic bronchitic changes. No focal consolidation. No  pleural effusion or pneumothorax. No acute osseous abnormality is evident. IMPRESSION: COPD. No acute pulmonary process identified. Stable cardiac silhouette. Electronically Signed   By: Kristine Garbe M.D.   On: 11/03/2017 16:32   Ct Head Wo Contrast Result Date: 11/03/2017 IMPRESSION: 1. Age-indeterminate perforator infarct in the left basal ganglia. 2. Remote lacunar infarct in the left centrum semiovale. Electronically Signed   By: Monte Fantasia M.D.   On: 11/03/2017 19:22   CT angio neck and brain 11/04/2017 :  1. Probable evolving acute perforator type infarct involving the posterior left lentiform nucleus/corona radiata. No associated hemorrhage or mass effect. 2. Atheromatous plaque about the carotid bifurcations bilaterally. Associated stenosis of up to 50% at the origin of the right ICA, and up to 65% at the origin of the left ICA. 3. Severely limited evaluation of the intracranial circulation due to extensive motion artifact Echocardiogram:  Study Conclusions - Left ventricle: The cavity size was normal. Wall thickness was   normal. Systolic function was normal. The estimated ejection   fraction was in the range of 60% to 65%. Wall motion was normal;   there were no regional wall motion abnormalities. Doppler   parameters are consistent with abnormal left ventricular   relaxation (grade 1 diastolic dysfunction). - Aortic valve: Trileaflet; mildly thickened, mildly calcified   leaflets. - Right ventricle: Pacer wire or catheter noted in right ventricle. Impressions: - No cardiac source of emboli was indentified. Compared to the   prior study, there has been no significant interval change.   B/L Carotid U/S:                                                PENDING _____________________________________________________________________ ASSESSMENT: Janet Steele is a 83 y.o. female with PMH of HTN, HLS, CAD admitted for history of 2 days of Right sided weakness.  CT head-subacute appearing infarct in the left basal ganglia   Age-indeterminate perforator infarct in the left basal ganglia. 2. Remote lacunar infarct in the left centrum semiovale Suspected Etiology: small vessel Resultant Symptoms: right-sided weakness Stroke Risk Factors: hyperlipidemia, hypertension and CAD, PVD Other Stroke Risk Factors: Advanced age, Coronary artery disease   PLAN  11/06/2017: Continue Aspirin/ Plavix [place] Started Atorvastatin 80 mg, high intensity statin MRI studies cancelled due to pacemaker.  Check CT angiogram of the brain and neck instead right thyroid nodule ongoing aggressive stroke risk factor management Patient counseled to be compliant with her antithrombotic medications  Elevated TSH Management per Medicine team  HYPERTENSION: Stable, Some elevated B/P's noted overnight Permissive hypertension (OK if <220/120) for 24-48 hours post stroke and then gradually normalized within 5-7 days. Long term BP goal normotensive. May slowly restart home B/P medications after 48 hours Home Meds: Norvasc, Metoprolol,   HYPERLIPIDEMIA:    Component Value Date/Time   CHOL 361 (H) 11/04/2017 2951  TRIG 230 (H) 11/04/2017 0742   HDL 32 (L) 11/04/2017 0742   CHOLHDL 11.3 11/04/2017 0742   VLDL 46 (H) 11/04/2017 0742   LDLCALC 283 (H) 11/04/2017 0742  Home Meds:  Crestor 40 mg LDL  goal < 70 Started on Lipitor to 80 mg daily Continue statin at discharge  DIABETES: Lab Results  Component Value Date   HGBA1C 7.1 (H) 11/01/2017  HgbA1c goal < 7.0 Currently [id number] Continue CBG monitoring and SSI DM education   Other Active Problems: Principal Problem:   CVA (cerebral vascular accident) ([place]) Active Problems:   Hypothyroidism   Diabetes ([place])   HYPERLIPIDEMIA   Essential hypertension   Abnormal involuntary movement   MCI (mild cognitive impairment) with memory loss   Weakness   Stroke [facility])  Hospital day # 2  VTE prophylaxis: Lovenox   Diet : Diet full liquid Room service appropriate? Yes; Fluid consistency: Thin   Prior Home Stroke Medications:  aspirin 81 mg daily, clopidogrel 75 mg daily and Crestor 40 mg  Hospital Current Stroke Medications: ASA 81 mg, Plavix 75 mg and Lipitor 80 mg Stroke New Meds Plan: Now on aspirin 81 mg daily, clopidogrel 75 mg daily and Lipitor 80 mg  Discharge Stroke Meds: Please discharge patient on aspirin 81 mg daily, clopidogrel 75 mg daily and Lipitor 80 mg   Disposition: 01-Home or Self Care Therapy Recs:  SNF Follow Recs:  Follow-up Information    Garvin Fila, MD. Schedule an appointment as soon as possible for a visit in 6 week(s).   Specialties:  Neurology, Radiology Contact information: [address] [place] [address] [phone]          Binnie Rail, MD-PCP in 2-3 weeks  FAMILY UPDATES: No family at bedside  TEAM UPDATES: Mariel Aloe, MD    I have personally examined this patient, reviewed notes, independently viewed imaging studies, participated in medical decision making and plan of care.ROS completed by me personally and pertinent positives fully documented  I have made any additions or clarifications directly to the above note.  Check carotid ultrasound and if moderate to severe left ICA stenosis is confirmed may need referral to vascular surgery for carotid revascularization. Continue antiplatelet therapy and aggressive risk factor modification. Transfer to skilled nursing facility next week after testing completed Discussed with patient and Dr. Lonny Prude and answered questions.  Greater than 50% time during this 25-minute visit was spent in counseling and coordination of care about her stroke and discussion about prevention and treatment options.  Antony Contras, MD Medical Director [facility] Pager: [phone] 11/06/2017 11:41 AM   To contact Stroke Continuity provider, please refer to [email or web address] After hours, contact  General Neurology

## 2017-11-06 NOTE — Progress Notes (Signed)
VASCULAR LAB PRELIMINARY  PRELIMINARY  PRELIMINARY  PRELIMINARY  Carotid duplex completed.    Preliminary report:  1-39% ICA stenosis, bilaterally, however, higher velocities may be obscured by acoustic shadowing.  Vertebral artery flow is antegrade.   Broadus Costilla, RVT 11/06/2017, 3:10 PM

## 2017-11-06 NOTE — Progress Notes (Signed)
Modified Barium Swallow Progress Note  Patient Details  Name: Janet Steele MRN: [id number] Date of Birth: 1929-12-06  Today's Date: 11/06/2017  Modified Barium Swallow completed.  Full report located under Chart Review in the Imaging Section.  Brief recommendations include the following:  Clinical Impression  MBS was completed using thin liquids, pureed material and solids.  The patient's oral and pharyngeal swallow were functional.  Mild oral transit delay noted for solids which appeared to be impacted by poorly fitting dentures.  Trace to mild pyriform residue seen given thin liquids that did not cause functional issues.  Esophageal sweep did not reveal overt issues.  Recommend a regular diet with thin liquids.  ST follow up is no longer indicated for swallowing.     Swallow Evaluation Recommendations       SLP Diet Recommendations: Regular solids;Thin liquid   Liquid Administration via: Cup;Straw   Medication Administration: Whole meds with liquid   Supervision: Patient able to self feed       Postural Changes: Seated upright at 90 degrees   Oral Care Recommendations: Oral care BID       Shelly Flatten, MA, [place] [facility] SLP [phone]  Lamar Sprinkles 11/06/2017,2:26 PM

## 2017-11-06 NOTE — NC FL2 (Signed)
[place] LEVEL OF CARE SCREENING TOOL     IDENTIFICATION  Patient Name: Janet Steele Birthdate: 13-Apr-1929 Sex: female Admission Date (Current Location): 11/03/2017  [facility] and [place] Number:  Herbalist and Address:  [facility], [place] [address], [place], [place] [address]      Provider Number: [id number]  Attending Physician Name and Address:  Mariel Aloe, MD  Relative Name and Phone Number:       Current Level of Care: Hospital Recommended Level of Care: [place] Prior Approval Number:    Date Approved/Denied:   PASRR Number: Manual review  Discharge Plan: SNF    Current Diagnoses: Patient Active Problem List   Diagnosis Date Noted  . Stroke ([place]) 11/04/2017  . Weakness 11/03/2017  . CVA (cerebral vascular accident) ([place]) 11/03/2017  . Viral upper respiratory tract infection 11/01/2017  . Chronic back pain 07/04/2017  . Rib pain on left side 04/20/2017  . PAD (peripheral artery disease) ([place]) 01/17/2017  . Overactive bladder 12/17/2016  . Anemia 05/20/2016  . Coronary artery disease involving coronary bypass graft of native heart without angina pectoris 05/19/2016  . Numbness and tingling of foot 12/31/2015  . MCI (mild cognitive impairment) with memory loss 11/18/2015  . Facial tic 05/07/2015  . Facial nerve spasm 05/07/2015  . Sick sinus syndrome ([place]) 02/26/2015  . Cervical dystonia 12/04/2014  . Chronic motor tic 09/12/2014  . Nonspecific abnormal electrocardiogram (ECG) (EKG)   . Unstable angina ([place]) 03/02/2014  . Pacemaker-[employer]tr[employer] 07/07/2012  . Bradycardia 07/05/2012  . GERD (gastroesophageal reflux disease) 07/05/2012  . Near syncope 12/23/2011  . Abnormal involuntary movement 12/17/2009  . Disturbance in sleep behavior 12/11/2009  . CAD, ARTERY BYPASS GRAFT 09/24/2009  . CAROTID BRUIT 09/24/2009  . Diabetes ([place]) 01/30/2009  . DYSPHAGIA 06/19/2008  . Anxiety 02/14/2008   . EROSIVE ESOPHAGITIS 02/14/2008  . CONSTIPATION, CHRONIC 02/14/2008  . DEGENERATIVE JOINT DISEASE 02/14/2008  . HEARING LOSS 01/04/2008  . PVD 01/04/2008  . Hypothyroidism 09/25/2007  . HYPERLIPIDEMIA 09/25/2007  . Essential hypertension 09/07/2007  . Diabetic polyneuropathy ([place]) 03/16/2007  . Stricture and stenosis of esophagus 03/16/2007  . HIATAL HERNIA 02/16/2007  . HEMORRHOIDS 12/31/1997  . DIVERTICULOSIS, COLON 12/31/1997    Orientation RESPIRATION BLADDER Height & Weight     Self, Time, Situation, Place  Normal Incontinent Weight: 126 lb 5.2 oz (57.3 kg) Height:  5\' 2"  (157.5 cm)  BEHAVIORAL SYMPTOMS/MOOD NEUROLOGICAL BOWEL NUTRITION STATUS  (None) (CVA) Continent Diet(Full liquid)  AMBULATORY STATUS COMMUNICATION OF NEEDS Skin   Limited Assist Verbally Skin abrasions, Bruising, Other (Comment)(Skin tear.)                       Personal Care Assistance Level of Assistance  Bathing, Feeding, Dressing Bathing Assistance: Limited assistance Feeding assistance: Limited assistance Dressing Assistance: Limited assistance     Functional Limitations Info  Sight, Hearing, Speech Sight Info: Adequate Hearing Info: Impaired Speech Info: Adequate    SPECIAL CARE FACTORS FREQUENCY  PT (By licensed PT), Blood pressure, OT (By licensed OT)     PT Frequency: 5 x week OT Frequency: 5 x week            Contractures Contractures Info: Not present    Additional Factors Info  Code Status, Allergies, Psychotropic Code Status Info: Full Allergies Info: Morphine, Penicillins, Prednisone, Sulfonamide Derivatives, Codeine, Hydrocodone-acetaminophen, Meperidine Hcl, Metoclopramide Hcl, Metoprolol Succinate, Moxifloxacin, Oxycodone-aspirin, Pentazocine Lactate, Pioglitazone Psychotropic  Info: Anxiety: Cymbalta DR 60 mg PO daily, Trazodone 100 mg PO QHS.         Current Medications (11/06/2017):  This is the current hospital active medication list Current  Facility-Administered Medications  Medication Dose Route Frequency Provider Last Rate Last Dose  . 0.9 %  sodium chloride infusion   Intravenous Continuous Karmen Bongo, MD 50 mL/hr at 11/05/17 1815    . acetaminophen (TYLENOL) tablet 650 mg  650 mg Oral Q4H PRN Karmen Bongo, MD   650 mg at 11/05/17 4132   Or  . acetaminophen (TYLENOL) solution 650 mg  650 mg Per Tube Q4H PRN Karmen Bongo, MD       Or  . acetaminophen (TYLENOL) suppository 650 mg  650 mg Rectal Q4H PRN Karmen Bongo, MD      . aspirin EC tablet 81 mg  81 mg Oral Daily Karmen Bongo, MD   81 mg at 11/06/17 0941  . atorvastatin (LIPITOR) tablet 80 mg  80 mg Oral q1800 Candise Che A, NP   80 mg at 11/05/17 1816  . clopidogrel (PLAVIX) tablet 75 mg  75 mg Oral Daily Karmen Bongo, MD   75 mg at 11/06/17 0941  . DULoxetine (CYMBALTA) DR capsule 60 mg  60 mg Oral Daily Karmen Bongo, MD   60 mg at 11/06/17 0941  . enoxaparin (LOVENOX) injection 30 mg  30 mg Subcutaneous Q24H Karmen Bongo, MD   30 mg at 11/06/17 0941  . feeding supplement (ENSURE ENLIVE) (ENSURE ENLIVE) liquid 237 mL  237 mL Oral BID BM Mariel Aloe, MD      . gabapentin (NEURONTIN) capsule 100 mg  100 mg Oral Ivery Quale, MD   100 mg at 11/05/17 2205  . insulin aspart (novoLOG) injection 0-9 Units  0-9 Units Subcutaneous TID WC Karmen Bongo, MD   1 Units at 11/06/17 [phone]  . levothyroxine (SYNTHROID, LEVOTHROID) tablet 150 mcg  150 mcg Oral QAC breakfast Karmen Bongo, MD   150 mcg at 11/06/17 [phone]  . ondansetron (ZOFRAN) tablet 4 mg  4 mg Oral Q6H PRN Karmen Bongo, MD       Or  . ondansetron [facility]) injection 4 mg  4 mg Intravenous Q6H PRN Karmen Bongo, MD      . pantoprazole (PROTONIX) EC tablet 40 mg  40 mg Oral BID Karmen Bongo, MD   40 mg at 11/06/17 0941  . senna-docusate (Senokot-S) tablet 1 tablet  1 tablet Oral QHS PRN Karmen Bongo, MD      . traMADol Veatrice [facility]) tablet 50 mg  50 mg Oral Q6H PRN Karmen Bongo, MD      . traZODone (DESYREL) tablet 100 mg  100 mg Oral Ivery Quale, MD   100 mg at 11/05/17 2205     Discharge Medications: Please see discharge summary for a list of discharge medications.  Relevant Imaging Results:  Relevant Lab Results:   Additional Information SS#: [id number]  Candie Chroman, LCSW

## 2017-11-07 DIAGNOSIS — I63312 Cerebral infarction due to thrombosis of left middle cerebral artery: Secondary | ICD-10-CM

## 2017-11-07 LAB — GLUCOSE, CAPILLARY
GLUCOSE-CAPILLARY: 138 mg/dL — AB (ref 65–99)
GLUCOSE-CAPILLARY: 200 mg/dL — AB (ref 65–99)
GLUCOSE-CAPILLARY: 168 mg/dL — AB (ref 65–99)
Glucose-Capillary: 245 mg/dL — ABNORMAL HIGH (ref 65–99)

## 2017-11-07 MED ORDER — LORAZEPAM 2 MG/ML IJ SOLN
0.5000 mg | Freq: Once | INTRAMUSCULAR | Status: AC
  Administered 2017-11-07: 0.5 mg via INTRAVENOUS
  Filled 2017-11-07: qty 1

## 2017-11-07 MED ORDER — LOSARTAN POTASSIUM 50 MG PO TABS
50.0000 mg | ORAL_TABLET | Freq: Every day | ORAL | Status: DC
  Administered 2017-11-07 – 2017-11-08 (×2): 50 mg via ORAL
  Filled 2017-11-07 (×2): qty 1

## 2017-11-07 NOTE — Progress Notes (Addendum)
STROKE TEAM PROGRESS NOTE   SUBJECTIVE (INTERVAL HISTORY) No family is at the bedside. Patient is found laying in bed in NAD. Overall she feels her condition is stable. States her Right Arm weakness has improved. Voices no new complaints. No new events reported overnight. Therapy recommendations are SNFand case manager is working on it.   PHYSICAL EXAM Temp:  [97.7 F (36.5 C)-98.9 F (37.2 C)] 98.9 F (37.2 C) (12/03 1055) Pulse Rate:  [61-66] 66 (12/03 1055) Resp:  [16-18] 18 (12/03 1055) BP: (121-164)/(54-80) 125/54 (12/03 1055) SpO2:  [97 %-98 %] 98 % (12/03 1055) General - Well nourished, well developed elderly Caucasian lady, in no apparent distress Respiratory - Lungs clear bilaterally. No wheezing. Cardiovascular - Regular rate and rhythm   Neuro: Mental Status: Patient is awake, alert, oriented to person,  Place, not oriented to month or year  patient is able to give a clear and coherent history. No signs of aphasia or neglect She appears to have mild psychomotor agitation. Cranial Nerves: II: Visual Fields are full. Pupils are equal, round, and reactive to light.   III,IV, VI: EOMI without ptosis or diploplia.  V: Facial sensation is symmetric to temperature VII: Facial movement is symmetric.  VIII: hearing is intact to voice X: Uvula elevates symmetrically XI: Shoulder shrug is symmetric. XII: tongue is midline without atrophy or fasciculations.  Motor: Tone is normal. Bulk is normal. 4/5 weakness of the right arm and leg with motor coordination out of proportion to weakness. Sensory: Sensation is symmetric to light touch and temperature in the arms, decreased in the right leg Deep Tendon Reflexes: 3+ and symmetric in the biceps and patellae with spread at the patella Cerebellar: She has incoordination to date cardiomegaly of  the right arm and  minimally leg.  IMAGING: I have personally reviewed the radiological images below and agree with the radiology  interpretations. Dg Chest 2 View Result Date: 11/03/2017 CLINICAL DATA:  83 y/o  F; 1 day weakness and increased urination. EXAM: CHEST  2 VIEW COMPARISON:  03/11/2017 chest radiograph FINDINGS: Stable normal cardiac silhouette. 2 lead pacemaker. Post median sternotomy with wires in alignment. Aortic atherosclerosis with calcification. Stable chronic bronchitic changes. No focal consolidation. No pleural effusion or pneumothorax. No acute osseous abnormality is evident. IMPRESSION: COPD. No acute pulmonary process identified. Stable cardiac silhouette. Electronically Signed   By: Kristine Garbe M.D.   On: 11/03/2017 16:32   Ct Head Wo Contrast Result Date: 11/03/2017 IMPRESSION: 1. Age-indeterminate perforator infarct in the left basal ganglia. 2. Remote lacunar infarct in the left centrum semiovale. Electronically Signed   By: Monte Fantasia M.D.   On: 11/03/2017 19:22   CT angio neck and brain 11/04/2017 :  1. Probable evolving acute perforator type infarct involving the posterior left lentiform nucleus/corona radiata. No associated hemorrhage or mass effect. 2. Atheromatous plaque about the carotid bifurcations bilaterally. Associated stenosis of up to 50% at the origin of the right ICA, and up to 65% at the origin of the left ICA. 3. Severely limited evaluation of the intracranial circulation due to extensive motion artifact Echocardiogram:  Study Conclusions - Left ventricle: The cavity size was normal. Wall thickness was   normal. Systolic function was normal. The estimated ejection   fraction was in the range of 60% to 65%. Wall motion was normal;   there were no regional wall motion abnormalities. Doppler   parameters are consistent with abnormal left ventricular   relaxation (grade 1 diastolic dysfunction). - Aortic valve:  Trileaflet; mildly thickened, mildly calcified   leaflets. - Right ventricle: Pacer wire or catheter noted in right ventricle. Impressions: - No  cardiac source of emboli was indentified. Compared to the   prior study, there has been no significant interval change.  B/L Carotid U/S:                                                 1-39% ICA stenosis, bilaterally, however, higher velocities may be obscured by acoustic shadowing.  Vertebral artery flow is antegrade.  ___________________________________________________________________ ASSESSMENT: Janet Steele is a 83 y.o. female with PMH of HTN, HLS, CAD admitted for history of 2 days of Right sided weakness. CT head-subacute appearing infarct in the left basal ganglia   Age-indeterminate perforator infarct in the left basal ganglia. 2. Remote lacunar infarct in the left centrum semiovale Suspected Etiology: small vessel Resultant Symptoms: right-sided weakness Stroke Risk Factors: hyperlipidemia, hypertension and CAD, PVD Other Stroke Risk Factors: Advanced age, Coronary artery disease  PLAN  11/07/2017: Continue Aspirin/ Plavix /Statin Ongoing aggressive stroke risk factor management Patient counseled to be compliant with her antithrombotic medications  Elevated TSH Management per Medicine team  HYPERTENSION: Stable,  Long term BP goal normotensive. Home Meds: Norvasc, Metoprolol,   HYPERLIPIDEMIA:    Component Value Date/Time   CHOL 361 (H) 11/04/2017 0742   TRIG 230 (H) 11/04/2017 0742   HDL 32 (L) 11/04/2017 0742   CHOLHDL 11.3 11/04/2017 0742   VLDL 46 (H) 11/04/2017 0742   LDLCALC 283 (H) 11/04/2017 0742  Home Meds:  Crestor 40 mg LDL  goal < 70 Started on Lipitor to 80 mg daily Continue statin at discharge  DIABETES: Lab Results  Component Value Date   HGBA1C 7.1 (H) 11/01/2017  HgbA1c goal < 7.0 Currently NA:TFTDDUK Continue CBG monitoring and SSI DM education   Other Active Problems: Principal Problem:   CVA (cerebral vascular accident) ([place]) Active Problems:   Hypothyroidism   Diabetes ([place])   HYPERLIPIDEMIA   Essential hypertension    Abnormal involuntary movement   MCI (mild cognitive impairment) with memory loss   Weakness   Stroke [facility])  Hospital day # 3  VTE prophylaxis: Lovenox  Diet : Diet full liquid Room service appropriate? Yes; Fluid consistency: Thin   Prior Home Stroke Medications:  aspirin 81 mg daily, clopidogrel 75 mg daily and Crestor 40 mg  Hospital Current Stroke Medications: ASA 81 mg, Plavix 75 mg and Lipitor 80 mg Stroke New Meds Plan: Now on aspirin 81 mg daily, clopidogrel 75 mg daily and Lipitor 80 mg  Discharge Stroke Meds: Please discharge patient on aspirin 81 mg daily, clopidogrel 75 mg daily and Lipitor 80 mg   Disposition: 01-Home or Self Care Therapy Recs:  SNF Follow Recs:  Follow-up Information    Garvin Fila, MD. Schedule an appointment as soon as possible for a visit in 6 week(s).   Specialties:  Neurology, Radiology Contact information: [place] [address] [phone]          Binnie Rail, MD-PCP in 2-3 weeks  FAMILY UPDATES: No family at bedside  TEAM UPDATES: Janet Aloe, MD  Janet Steele, ANP-C 11/07/2017 11:29 AM   Attending note: I reviewed above note and agree with the assessment and plan. I have made any additions or clarifications directly to  the above note. Pt was seen and examined.   83 year old female with history of hypertension, hyperlipidemia, CAD status post CABG, diabetes, status post pacemaker admitted for right-sided weakness for 2 days.  CT showed subacute left BG infarcts.  CT head and neck showed bilateral ICA proximal 50-60% stenosis, bilateral siphon atherosclerosis.  EF 60-65% carotid Doppler unremarkable.  LDL 283, A1c 7.1 and TSH 12.  Currently on aspirin 81 and Plavix 75 with Lipitor 80.  She still has right upper and lower extremity mild hemiparesis, pending CIR for rehab.  Continue follow-up with Dr. Krista Blue or Dr. Brett Fairy at [facility].   Neurology will sign off. Please call with questions. Pt will follow  up with Dr. Brett Fairy at [facility] on 12/13/17 as already scheduled. Thanks for the consult.   Janet Hawking, MD PhD Stroke Neurology 11/07/2017 10:01 PM   To contact Stroke Continuity provider, please refer to [email or web address] After hours, contact General Neurology

## 2017-11-07 NOTE — Consult Note (Signed)
[facility] CM Primary Care Navigator  11/07/2017  Janet Steele 1929-11-20 [id number]   Met withpatientand husband Rush Landmark) at the bedside to identify possible discharge needs. Patient was dozing on and off during this visit. Patient's husband reports that patient was having right-sided weakness for a couple days and was noted that her right arm is not coordinated, and having trouble standing on her right leg which had led to this admission.  Husband endorses Dr. Billey Gosling with [facility] [place] as the primary care provider.   Patient has been using [employer] to obtain medications without difficulty.   Husband mentioned that patient was managing hermedications at home straight out of the containers but states that will change after patient gets back home. Husband and son will be then be managing patient's medications using "pill box" system as stated.  Husband reports that he is providingtransportation to her doctorsappointments.   Patient's husband is the primary caregiver at home. He mentioned that both of them are residing at [facility] since June 2018.  Anticipated discharge planis SNF (skilled nursing facility- in process) for rehabilitation per therapy recommendation.  Husband voiced understanding to callprimary care provider's officewhenpatient returns back homefor a post discharge follow-upwithin a week or sooner if needed.Patient letter (with PCP's contact number) was provided as areminder.  Explained to patient and husband about [facility] CM services available for health management at home.Husband reports that he assists patient in managing health issues. Patient's husband expressed understanding to seekreferral to [facility] care managementfrom primary care provider if deemed necessary forservicesin the future- once patient is discharged home.   [facility] care management information  provided for future needs that may arise.   For questions, please contact:  Dannielle Huh, BSN, RN- [facility] Primary Care Navigator  Telephone: [phone] [place]

## 2017-11-07 NOTE — Progress Notes (Signed)
PROGRESS NOTE    Janet Steele  WNI:[id number] DOB: 1929-10-04 DOA: 11/03/2017 PCP: Binnie Rail, MD    Brief Narrative: Janet Steele is a 83 y.o. female with a history of diabetes mellitus, PVD, status post pacemaker, CAD, hypothyroidism, hypertension, hyperlipidemia, GERD, diastolic heart dysfunction.  She presented with lower extremity weakness and was found to have a likely stroke.   Assessment & Plan:   Principal Problem:   CVA (cerebral vascular accident) ([place]) Active Problems:   Hypothyroidism   Diabetes ([place])   HYPERLIPIDEMIA   Essential hypertension   Abnormal involuntary movement   MCI (mild cognitive impairment) with memory loss   Weakness   Stroke [facility])   Stroke Subacute from CT per neurology read.  Patient initially passed swallow evaluation by speech therapy, but started to display symptoms of dysphasia.  Patient placed on full liquid diet  SLP. LDL significantly elevated at 283. Unsure if patient is adherent with medications. She is also on aspirin and Plavix as an outpatient. EF of 60-65% without evidence of emboli. CTA head/neck significant for acute perforator type infarct of posterior left lentiform nucleus/corona radiata and 50-56% stenosis of right and left ICA respectively. Carotid dopplers with 1-39% ICA stenosis bilaterally. -neurology recommendations: carotid ultrasound -PT/OT: SNF -continue aspirin and Plavix -continue Crestor  Mild dementia Per PCP records.   Hypothyroidism -Continue Synthroid  Involuntary movements Chronic.  Essential hypertension Permissive hypertension in the setting of stroke, although it appears this is subacute.  Lopressor, Norvasc, and Cozaar held on admission. -restart Cozaar  Hyperlipidemia -Continue Crestor  Diabetes mellitus Patient on Amaryl and Januvia as an outpatient.  Held on admission. -Continue sliding scale insulin, sensitive  Mood disorder -Continue Cymbalta  GERD -Continue Protonix   DVT  prophylaxis: Lovenox Code Status: Full code Family Communication: None at bedside Disposition Plan: Pending PT/OT   Consultants:   Neurology/Stroke team  Procedures:   None  Antimicrobials:  None    Subjective: No overnight events  Objective: Vitals:   11/06/17 2146 11/07/17 0126 11/07/17 0626 11/07/17 1055  BP: 140/61 135/65 (!) 164/80 (!) 125/54  Pulse: 63 62 61 66  Resp: 18 18 16 18   Temp: 98.8 F (37.1 C) 98.5 F (36.9 C) 98.3 F (36.8 C) 98.9 F (37.2 C)  TempSrc: Oral Oral Oral Oral  SpO2: 98% 98% 98% 98%  Weight:      Height:        Intake/Output Summary (Last 24 hours) at 11/07/2017 1133 Last data filed at 11/06/2017 2100 Gross per 24 hour  Intake 470 ml  Output -  Net 470 ml   Filed Weights   11/04/17 0102  Weight: 57.3 kg (126 lb 5.2 oz)    Examination:  General exam: Appears calm and comfortable   Data Reviewed: I have personally reviewed following labs and imaging studies  CBC: Recent Labs  Lab 11/03/17 1549  WBC 7.8  NEUTROABS 5.8  HGB 13.9  HCT 40.9  MCV 81.6  PLT 381   Basic Metabolic Panel: Recent Labs  Lab 11/01/17 1702 11/03/17 1549  NA 135 136  K 4.5 3.8  CL 99 100*  CO2 26 26  GLUCOSE 259* 172*  BUN 19 13  CREATININE 1.06 0.94  CALCIUM 9.7 9.2   GFR: Estimated Creatinine Clearance: 34 mL/min (by C-G formula based on SCr of 0.94 mg/dL). Liver Function Tests: Recent Labs  Lab 11/01/17 1702 11/03/17 1549  AST 15 24  ALT 11 16  ALKPHOS 103 117  BILITOT 0.5  1.2  PROT 7.6 7.8  ALBUMIN 4.2 4.3   No results for input(s): LIPASE, AMYLASE in the last 168 hours. No results for input(s): AMMONIA in the last 168 hours. Coagulation Profile: No results for input(s): INR, PROTIME in the last 168 hours. Cardiac Enzymes: Recent Labs  Lab 11/03/17 1549  CKTOTAL 209  TROPONINI <0.03   BNP (last 3 results) No results for input(s): PROBNP in the last 8760 hours. HbA1C: No results for input(s): HGBA1C in the  last 72 hours. CBG: Recent Labs  Lab 11/06/17 1146 11/06/17 1826 11/06/17 2109 11/07/17 0605 11/07/17 1111  GLUCAP 173* 206* 124* 138* 200*   Lipid Profile: No results for input(s): CHOL, HDL, LDLCALC, TRIG, CHOLHDL, LDLDIRECT in the last 72 hours. Thyroid Function Tests: No results for input(s): TSH, T4TOTAL, FREET4, T3FREE, THYROIDAB in the last 72 hours. Anemia Panel: No results for input(s): VITAMINB12, FOLATE, FERRITIN, TIBC, IRON, RETICCTPCT in the last 72 hours. Sepsis Labs: No results for input(s): PROCALCITON, LATICACIDVEN in the last 168 hours.  Recent Results (from the past 240 hour(s))  Urine Culture     Status: Abnormal   Collection Time: 11/03/17  3:36 PM  Result Value Ref Range Status   Specimen Description URINE, CATHETERIZED  Final   Special Requests NONE  Final   Culture (A)  Final    <10,000 COLONIES/mL INSIGNIFICANT GROWTH Performed at [facility], [address] [place], [place] [address]    Report Status 11/04/2017 FINAL  Final  MRSA PCR Screening     Status: None   Collection Time: 11/04/17  1:55 AM  Result Value Ref Range Status   MRSA by PCR NEGATIVE NEGATIVE Final    Comment:        The GeneXpert MRSA Assay (FDA approved for NASAL specimens only), is one component of a comprehensive MRSA colonization surveillance program. It is not intended to diagnose MRSA infection nor to guide or monitor treatment for MRSA infections.          Radiology Studies: No results found.      Scheduled Meds: . aspirin EC  81 mg Oral Daily  . atorvastatin  80 mg Oral q1800  . clopidogrel  75 mg Oral Daily  . DULoxetine  60 mg Oral Daily  . enoxaparin (LOVENOX) injection  30 mg Subcutaneous Q24H  . feeding supplement (ENSURE ENLIVE)  237 mL Oral BID BM  . gabapentin  100 mg Oral QHS  . insulin aspart  0-9 Units Subcutaneous TID WC  . levothyroxine  150 mcg Oral QAC breakfast  . pantoprazole  40 mg Oral BID  . traZODone  100 mg Oral QHS     Continuous Infusions: . sodium chloride 50 mL/hr at 11/06/17 1604     LOS: 3 days     Cordelia Poche, MD [facility] 11/07/2017, 11:33 AM Pager: [phone]  If 7PM-7AM, please contact night-coverage [email or web address] Password [facility] 11/07/2017, 11:33 AM

## 2017-11-07 NOTE — Discharge Summary (Signed)
Physician Discharge Summary  Janet Steele YSA:[id number] DOB: 1929/07/23 DOA: 11/03/2017  PCP: Binnie Rail, MD  Admit date: 11/03/2017 Discharge date: 11/08/2017  Admitted From: [facility] Disposition: SNF, [facility]  Recommendations for Outpatient Follow-up:  1. Follow up with PCP in 1 week 2. Follow up with neurology in 6 weeks 3. Please obtain BMP/CBC in one week 4. Please follow up on the following pending results: None  Home Health: SNF Equipment/Devices: SNF  Discharge Condition: Stable CODE STATUS: Full code Diet recommendation: Heart healthy/carb modified   Brief/Interim Summary:  Admission HPI written by Janet Bongo, MD   Chief Complaint: weakness  HPI: Janet Steele is a 83 y.o. female with medical history significant of DM, PVD, pacemaker placement, CAD, hypothyroidism, HTN, HLD, GERD, and diastolic dysfunction presenting with LE weakness.  She reports that she "had an appointment with Dr. Opal Steele, what's his name? This is just lunacy."  She reports that she just wants someone to give her a ride back to [place].  She was agitated and uncooperative at the time of my evaluation.  HPI per Dr. Zenia Steele: 83 year old female presents with 1 day history of bilateral lower extremity weakness worse with standing. Saw her physician 2 days ago for routine checkup and that visit was reviewed and patient was noted to have had high cholesterol and her medications were changed but that prescription has not been filled yet. Since that time when she tries to walk her legs become weak and she has to fall to the ground. No history of trauma from this. Denies any back pain. No bowel or bladder dysfunction. No headaches, confusion. No recent fever or chills. Discomfort. No upper extremity weakness. No neck discomfort. History of similar symptoms in the past without diagnosis. Does note some polyuria. Denies any muscle aches. No treatment use prior to arrival.  ED Course: Can't  stand.  H/o hypothyroidism, TSH 18.  Legs so weak, cannot support weight.  H/o TSH 40 in 7/18, normalized and now going back up.  Why can't she walk?  Neurology consulted, CT and exam concerning for CVA; requests transfer to [facility]   Hospital course:  Stroke Subacute from CT per neurology read.  Patient initially passed swallow evaluation by speech therapy, but started to display symptoms of dysphasia.  Patient placed on full liquid diet  SLP. LDL significantly elevated at 283. Unsure if patient is adherent with medications. She is also on aspirin and Plavix as an outpatient. EF of 60-65% without evidence of emboli. CTA head/neck significant for acute perforator type infarct of posterior left lentiform nucleus/corona radiata and 50-56% stenosis of right and left ICA respectively. Carotid dopplers with 1-39% ICA stenosis bilaterally. Crestor changes to Lipitor.  Mild dementia Per PCP records. Stable.   Hypothyroidism Continued Synthroid  Involuntary movements Chronic.  Essential hypertension Permissive hypertension in the setting of stroke, although it appears this is subacute. Lopressor, Norvasc, and Cozaar held on admission. Restarted Cozaar and amlodipine on discharge. Consider restarting Lopressor if blood pressure still uncontrolled and watch for bradycardia as patient's heart rate already in the 60s.  Hyperlipidemia Continued Crestor  Diabetes mellitus Patient on Amaryl and Januvia as an outpatient.  Held on admission. Sliding scale insulin while inpatient. On discharge, glimepiride discontinued secondary to high risk of hypoglycemia in elderly. Would recommend glipizide if wanting to use sulfonylurea for diabetes management.  Mood disorder Continued Cymbalta  GERD Continued Protonix   Discharge Diagnoses:  Principal Problem:   CVA (cerebral vascular accident) [facility]) Active Problems:  Hypothyroidism   Diabetes (Janet Steele)   HYPERLIPIDEMIA   Essential hypertension    Abnormal involuntary movement   MCI (mild cognitive impairment) with memory loss   Weakness   Stroke [facility])    Discharge Instructions  Discharge Instructions    Diet - low sodium heart healthy   Complete by:  As directed    Increase activity slowly   Complete by:  As directed      Allergies as of 11/08/2017      Reactions   Morphine Hives, Itching, Rash   Penicillins Itching, Rash   Has patient had a PCN reaction causing immediate rash, facial/tongue/throat swelling, SOB or lightheadedness with hypotension: Yes Has patient had a PCN reaction causing severe rash involving mucus membranes or skin necrosis: No Has patient had a PCN reaction that required hospitalization No Has patient had a PCN reaction occurring within the last 10 years: No If all of the above answers are "NO", then may proceed with Cephalosporin use.   Prednisone Other (See Comments)   MEMORY LOSS    Sulfonamide Derivatives Itching, Rash   Codeine Rash   Hydrocodone-acetaminophen Other (See Comments)   unknown   Meperidine Hcl Itching   Metoclopramide Hcl Itching, Rash   Metoprolol Succinate Other (See Comments)   nervous   Moxifloxacin Itching   Oxycodone-aspirin Rash   Pentazocine Lactate Nausea Only   Pioglitazone Other (See Comments)   bloating      Medication List    STOP taking these medications   glimepiride 2 MG tablet Commonly known as:  AMARYL   rosuvastatin 40 MG tablet Commonly known as:  CRESTOR   traMADol 50 MG tablet Commonly known as:  ULTRAM     TAKE these medications   acetaminophen 500 MG tablet Commonly known as:  TYLENOL Take 2 tablets (1,000 mg total) by mouth every 6 (six) hours as needed.   amLODipine 5 MG tablet Commonly known as:  NORVASC take 1 tablet by mouth once daily   atorvastatin 80 MG tablet Commonly known as:  LIPITOR Take 1 tablet (80 mg total) by mouth daily at 6 PM.   clopidogrel 75 MG tablet Commonly known as:  PLAVIX Take 1 tablet (75 mg total)  daily by mouth.   DULoxetine 60 MG capsule Commonly known as:  CYMBALTA take 1 capsule by mouth once daily   feeding supplement (ENSURE ENLIVE) Liqd Take 237 mLs by mouth 2 (two) times daily between meals.   gabapentin 100 MG capsule Commonly known as:  NEURONTIN Take 100 mg by mouth at bedtime. What changed:  Another medication with the same name was removed. Continue taking this medication, and follow the directions you see here.   levothyroxine 150 MCG tablet Commonly known as:  SYNTHROID, LEVOTHROID Take 1 tablet (150 mcg total) by mouth daily.   losartan 50 MG tablet Commonly known as:  COZAAR Take 1 tablet (50 mg total) by mouth daily.   metFORMIN 1000 MG tablet Commonly known as:  GLUCOPHAGE Take 1 tablet (1,000 mg total) by mouth 2 (two) times daily with a meal.   metoprolol tartrate 25 MG tablet Commonly known as:  LOPRESSOR take 1 tablet by mouth twice a day   mupirocin ointment 2 % Commonly known as:  BACTROBAN   nitroGLYCERIN 0.4 MG SL tablet Commonly known as:  NITROSTAT Place 1 tablet (0.4 mg total) under the tongue every 5 (five) minutes as needed. For chest pain   pantoprazole 40 MG tablet Commonly known as:  PROTONIX take  1 tablet by mouth twice a day   RA ASPIRIN EC 81 MG EC tablet Generic drug:  aspirin take 1 tablet by mouth once daily . OVERDUE FOR FOLLOW UP. PLEASE CALL AND SCHEDULE [phone].   sitaGLIPtin 50 MG tablet Commonly known as:  JANUVIA Take 1 tablet (50 mg total) by mouth daily.   traZODone 50 MG tablet Commonly known as:  DESYREL Take 2 tablets (100 mg total) by mouth at bedtime.       Contact information for follow-up providers    Dohmeier, Asencion Partridge, MD. Go on 12/13/2017.   Specialty:  Neurology Contact information: [address] [place] [address] [phone]            Contact information for after-discharge care    Destination    [facility] .   Service:  Skilled  Nursing Contact information: [place] [phone]                 Allergies  Allergen Reactions  . Morphine Hives, Itching and Rash  . Penicillins Itching and Rash    Has patient had a PCN reaction causing immediate rash, facial/tongue/throat swelling, SOB or lightheadedness with hypotension: Yes Has patient had a PCN reaction causing severe rash involving mucus membranes or skin necrosis: No Has patient had a PCN reaction that required hospitalization No Has patient had a PCN reaction occurring within the last 10 years: No If all of the above answers are "NO", then may proceed with Cephalosporin use.   . Prednisone Other (See Comments)    MEMORY LOSS   . Sulfonamide Derivatives Itching and Rash  . Codeine Rash  . Hydrocodone-Acetaminophen Other (See Comments)    unknown  . Meperidine Hcl Itching  . Metoclopramide Hcl Itching and Rash  . Metoprolol Succinate Other (See Comments)    nervous  . Moxifloxacin Itching  . Oxycodone-Aspirin Rash  . Pentazocine Lactate Nausea Only  . Pioglitazone Other (See Comments)    bloating    Consultations:  Neurology/Stroke   Procedures/Studies: Ct Angio Head W Or Wo Contrast  Result Date: 11/04/2017 CLINICAL DATA:  Follow-up examination for acute stroke, right-sided weakness. EXAM: CT ANGIOGRAPHY HEAD AND NECK TECHNIQUE: Multidetector CT imaging of the head and neck was performed using the standard protocol during bolus administration of intravenous contrast. Multiplanar CT image reconstructions and MIPs were obtained to evaluate the vascular anatomy. Carotid stenosis measurements (when applicable) are obtained utilizing NASCET criteria, using the distal internal carotid diameter as the denominator. CONTRAST:  38mL ISOVUE-370 IOPAMIDOL (ISOVUE-370) INJECTION 76% COMPARISON:  Prior CT from 11/03/2017. FINDINGS: CT HEAD FINDINGS Brain: Stable atrophy with chronic small vessel ischemic  disease. Remote lacunar infarct again noted within the anterior left centrum semi ovale. Curvilinear hypodensity extending from the posterior left lentiform nucleus towards the left corona radiata noted, most likely reflects an evolving acute ischemic perforator type infarct (series 5, image 17). No associated hemorrhage or mass effect. No other acute intracranial hemorrhage. No other acute or evolving large vessel territory infarct. No mass lesion, midline shift or mass effect. No hydrocephalus. No extra-axial fluid collection. Vascular: No hyperdense vessel. Scattered vascular calcifications noted within the carotid siphons. Skull: Scalp soft tissues and calvarium within normal limits. Sinuses: Visualized paranasal sinuses are largely clear. Mastoid air cells are clear. Orbits: Globes and orbital soft tissues within normal limits. Patient status post lens extraction bilaterally. Review of the MIP images confirms the above findings CTA NECK FINDINGS Aortic arch:  Visualized aortic arch of normal caliber with normal 3 vessel morphology. Scattered atheromatous plaque throughout the visualize arch and proximal great vessels. No hemodynamically significant stenosis at the origin of the great vessels. Visualized subclavian artery is patent without flow-limiting stenosis. Right carotid system: The right common carotid artery tortuous proximally but patent to the bifurcation without flow-limiting stenosis. Eccentric calcified plaque about the origin of the right ICA and associated short-segment stenosis of up to 50% by NASCET criteria. Right ICA otherwise widely patent distally to the skullbase without stenosis, dissection, or occlusion. Left carotid system: Left common carotid artery mildly tortuous proximally but is widely patent to the bifurcation without stenosis. Prominent calcified plaque about the left carotid bifurcation/proximal left ICA. Associated short-segment stenosis of up to 65% by NASCET criteria. Left ICA  otherwise patent to the skullbase without stenosis, dissection, or occlusion. Vertebral arteries: Both of the vertebral artery is arise from the subclavian arteries. Vertebral artery is largely code dominant. Focal plaque at the origin of the right vertebral artery with relatively mild stenosis. Vertebral arteries otherwise widely patent within the neck without stenosis, dissection, or occlusion. Skeleton: No acute osseus abnormality. No worrisome lytic or blastic osseous lesions. Moderate degenerative spondylolysis noted at C4-5 through C6-7. Other neck: No acute soft tissue abnormality identified within the neck. No adenopathy. Salivary glands within normal limits. Thyroid appears to be absent. Upper chest: Visualized upper mediastinum within normal limits. Left-sided pacemaker/ AICD partially visualized. Median sternotomy wires noted. Partially visualized lungs are clear. Review of the MIP images confirms the above findings CTA HEAD FINDINGS Anterior circulation: Examination severely limited by extensive motion artifact. Petrous ICAs patent bilaterally without definite stenosis. Calcified atheromatous plaque seen diffusely throughout the cavernous/supraclinoid ICAs without obvious flow-limiting stenosis, although evaluation limited by motion artifact. ICA termini grossly patent bilaterally. Grossly patent A1 segments. Anterior communicating artery not well evaluated due to motion. Anterior cerebral arteries grossly patent to their distal aspects with symmetric enhancement. M1 segments patent bilaterally without obvious flow-limiting stenosis. Grossly normal MCA bifurcations. No obvious proximal M2 occlusion. Distal MCA branches well perfused bilaterally and grossly symmetric. Posterior circulation: Mild atheromatous regularity within the V4 segments bilaterally without flow-limiting stenosis. Posterior inferior cerebral artery is patent bilaterally. Basilar artery widely patent proximally, poorly evaluated at its  mid and distal aspect due to motion artifact. No obvious flow-limiting stenosis. Superior cerebral arteries patent bilaterally. Evaluation of the PCAs markedly limited by motion, although are grossly patent to their distal aspects without occlusion or obvious stenosis. Venous sinuses: Grossly patent, although evaluation markedly limited by motion. Anatomic variants: None significant. Delayed phase: No abnormal enhancement. Review of the MIP images confirms the above findings IMPRESSION: 1. Probable evolving acute perforator type infarct involving the posterior left lentiform nucleus/corona radiata. No associated hemorrhage or mass effect. 2. Atheromatous plaque about the carotid bifurcations bilaterally. Associated stenosis of up to 50% at the origin of the right ICA, and up to 65% at the origin of the left ICA. 3. Severely limited evaluation of the intracranial circulation due to extensive motion artifact. No large vessel occlusion. No obvious high-grade or flow-limiting stenosis. Electronically Signed   By: Jeannine Boga M.D.   On: 11/04/2017 23:17   Dg Chest 2 View  Result Date: 11/03/2017 CLINICAL DATA:  83 y/o  F; 1 day weakness and increased urination. EXAM: CHEST  2 VIEW COMPARISON:  03/11/2017 chest radiograph FINDINGS: Stable normal cardiac silhouette. 2 lead pacemaker. Post median sternotomy with wires in alignment. Aortic atherosclerosis with calcification. Stable chronic  bronchitic changes. No focal consolidation. No pleural effusion or pneumothorax. No acute osseous abnormality is evident. IMPRESSION: COPD. No acute pulmonary process identified. Stable cardiac silhouette. Electronically Signed   By: Kristine Garbe M.D.   On: 11/03/2017 16:32   Ct Head Wo Contrast  Result Date: 11/03/2017 CLINICAL DATA:  Subacute neuro deficits. Weakness and foul smelling urine. EXAM: CT HEAD WITHOUT CONTRAST TECHNIQUE: Contiguous axial images were obtained from the base of the skull through  the vertex without intravenous contrast. COMPARISON:  11/01/2017 FINDINGS: Brain: There is a wedge-shaped low-density in the left putamen extending to the caudate on coronal reformats. This is somewhat indistinct and and is age-indeterminate. More well-defined low-density lacunar infarct in the left centrum semiovale. No hemorrhage or hydrocephalus. No masslike finding. Vascular: Arterial calcification.  No hyperdense vessel. Skull: No acute or aggressive finding. Sinuses/Orbits: Bilateral cataract resection. These results were called by telephone at the time of interpretation on 11/03/2017 at 7:21 pm to Dr. Lacretia Leigh , who verbally acknowledged these results. IMPRESSION: 1. Age-indeterminate perforator infarct in the left basal ganglia. 2. Remote lacunar infarct in the left centrum semiovale. Electronically Signed   By: Monte Fantasia M.D.   On: 11/03/2017 19:22   Ct Angio Neck W Or Wo Contrast  Result Date: 11/04/2017 CLINICAL DATA:  Follow-up examination for acute stroke, right-sided weakness. EXAM: CT ANGIOGRAPHY HEAD AND NECK TECHNIQUE: Multidetector CT imaging of the head and neck was performed using the standard protocol during bolus administration of intravenous contrast. Multiplanar CT image reconstructions and MIPs were obtained to evaluate the vascular anatomy. Carotid stenosis measurements (when applicable) are obtained utilizing NASCET criteria, using the distal internal carotid diameter as the denominator. CONTRAST:  49mL ISOVUE-370 IOPAMIDOL (ISOVUE-370) INJECTION 76% COMPARISON:  Prior CT from 11/03/2017. FINDINGS: CT HEAD FINDINGS Brain: Stable atrophy with chronic small vessel ischemic disease. Remote lacunar infarct again noted within the anterior left centrum semi ovale. Curvilinear hypodensity extending from the posterior left lentiform nucleus towards the left corona radiata noted, most likely reflects an evolving acute ischemic perforator type infarct (series 5, image 17). No  associated hemorrhage or mass effect. No other acute intracranial hemorrhage. No other acute or evolving large vessel territory infarct. No mass lesion, midline shift or mass effect. No hydrocephalus. No extra-axial fluid collection. Vascular: No hyperdense vessel. Scattered vascular calcifications noted within the carotid siphons. Skull: Scalp soft tissues and calvarium within normal limits. Sinuses: Visualized paranasal sinuses are largely clear. Mastoid air cells are clear. Orbits: Globes and orbital soft tissues within normal limits. Patient status post lens extraction bilaterally. Review of the MIP images confirms the above findings CTA NECK FINDINGS Aortic arch: Visualized aortic arch of normal caliber with normal 3 vessel morphology. Scattered atheromatous plaque throughout the visualize arch and proximal great vessels. No hemodynamically significant stenosis at the origin of the great vessels. Visualized subclavian artery is patent without flow-limiting stenosis. Right carotid system: The right common carotid artery tortuous proximally but patent to the bifurcation without flow-limiting stenosis. Eccentric calcified plaque about the origin of the right ICA and associated short-segment stenosis of up to 50% by NASCET criteria. Right ICA otherwise widely patent distally to the skullbase without stenosis, dissection, or occlusion. Left carotid system: Left common carotid artery mildly tortuous proximally but is widely patent to the bifurcation without stenosis. Prominent calcified plaque about the left carotid bifurcation/proximal left ICA. Associated short-segment stenosis of up to 65% by NASCET criteria. Left ICA otherwise patent to the skullbase without stenosis, dissection, or occlusion. Vertebral  arteries: Both of the vertebral artery is arise from the subclavian arteries. Vertebral artery is largely code dominant. Focal plaque at the origin of the right vertebral artery with relatively mild stenosis.  Vertebral arteries otherwise widely patent within the neck without stenosis, dissection, or occlusion. Skeleton: No acute osseus abnormality. No worrisome lytic or blastic osseous lesions. Moderate degenerative spondylolysis noted at C4-5 through C6-7. Other neck: No acute soft tissue abnormality identified within the neck. No adenopathy. Salivary glands within normal limits. Thyroid appears to be absent. Upper chest: Visualized upper mediastinum within normal limits. Left-sided pacemaker/ AICD partially visualized. Median sternotomy wires noted. Partially visualized lungs are clear. Review of the MIP images confirms the above findings CTA HEAD FINDINGS Anterior circulation: Examination severely limited by extensive motion artifact. Petrous ICAs patent bilaterally without definite stenosis. Calcified atheromatous plaque seen diffusely throughout the cavernous/supraclinoid ICAs without obvious flow-limiting stenosis, although evaluation limited by motion artifact. ICA termini grossly patent bilaterally. Grossly patent A1 segments. Anterior communicating artery not well evaluated due to motion. Anterior cerebral arteries grossly patent to their distal aspects with symmetric enhancement. M1 segments patent bilaterally without obvious flow-limiting stenosis. Grossly normal MCA bifurcations. No obvious proximal M2 occlusion. Distal MCA branches well perfused bilaterally and grossly symmetric. Posterior circulation: Mild atheromatous regularity within the V4 segments bilaterally without flow-limiting stenosis. Posterior inferior cerebral artery is patent bilaterally. Basilar artery widely patent proximally, poorly evaluated at its mid and distal aspect due to motion artifact. No obvious flow-limiting stenosis. Superior cerebral arteries patent bilaterally. Evaluation of the PCAs markedly limited by motion, although are grossly patent to their distal aspects without occlusion or obvious stenosis. Venous sinuses: Grossly  patent, although evaluation markedly limited by motion. Anatomic variants: None significant. Delayed phase: No abnormal enhancement. Review of the MIP images confirms the above findings IMPRESSION: 1. Probable evolving acute perforator type infarct involving the posterior left lentiform nucleus/corona radiata. No associated hemorrhage or mass effect. 2. Atheromatous plaque about the carotid bifurcations bilaterally. Associated stenosis of up to 50% at the origin of the right ICA, and up to 65% at the origin of the left ICA. 3. Severely limited evaluation of the intracranial circulation due to extensive motion artifact. No large vessel occlusion. No obvious high-grade or flow-limiting stenosis. Electronically Signed   By: Jeannine Boga M.D.   On: 11/04/2017 23:17    Echocardiogram (11/04/2017)  Study Conclusions  - Left ventricle: The cavity size was normal. Wall thickness was   normal. Systolic function was normal. The estimated ejection   fraction was in the range of 60% to 65%. Wall motion was normal;   there were no regional wall motion abnormalities. Doppler   parameters are consistent with abnormal left ventricular   relaxation (grade 1 diastolic dysfunction). - Aortic valve: Trileaflet; mildly thickened, mildly calcified   leaflets. - Right ventricle: Pacer wire or catheter noted in right ventricle.  Impressions:  - No cardiac source of emboli was indentified. Compared to the   prior study, there has been no significant interval change.   Subjective: No concerns overnight. Wants to leave the hospital  Discharge Exam: Vitals:   11/08/17 0400 11/08/17 0826  BP: (!) 135/55 (!) 143/44  Pulse: 75 70  Resp: 18 16  Temp: 98 F (36.7 C) 98.4 F (36.9 C)  SpO2: 97% 98%   Vitals:   11/07/17 2200 11/08/17 0026 11/08/17 0400 11/08/17 0826  BP: (!) 138/49 (!) 137/54 (!) 135/55 (!) 143/44  Pulse: 66 71 75 70  Resp: 20 18  18 16  Temp: 98.2 F (36.8 C) 97.9 F (36.6 C) 98  F (36.7 C) 98.4 F (36.9 C)  TempSrc: Oral Oral Oral Oral  SpO2: 93% 96% 97% 98%  Weight:      Height:        General: Pt is alert, awake, not in acute distress Cardiovascular: RRR, S1/S2 +, no rubs, no gallops Respiratory: CTA bilaterally, no wheezing, no rhonchi Abdominal: Soft, NT, ND, bowel sounds + Extremities: no edema, no cyanosis Neuro: 5/5 upper extremity strength which is an improvement.    The results of significant diagnostics from this hospitalization (including imaging, microbiology, ancillary and laboratory) are listed below for reference.     Microbiology: Recent Results (from the past 240 hour(s))  Urine Culture     Status: Abnormal   Collection Time: 11/03/17  3:36 PM  Result Value Ref Range Status   Specimen Description URINE, CATHETERIZED  Final   Special Requests NONE  Final   Culture (A)  Final    <10,000 COLONIES/mL INSIGNIFICANT GROWTH Performed at [facility], [address] [place], [place] [address]    Report Status 11/04/2017 FINAL  Final  MRSA PCR Screening     Status: None   Collection Time: 11/04/17  1:55 AM  Result Value Ref Range Status   MRSA by PCR NEGATIVE NEGATIVE Final    Comment:        The GeneXpert MRSA Assay (FDA approved for NASAL specimens only), is one component of a comprehensive MRSA colonization surveillance program. It is not intended to diagnose MRSA infection nor to guide or monitor treatment for MRSA infections.      Labs: BNP (last 3 results) No results for input(s): BNP in the last 8760 hours. Basic Metabolic Panel: Recent Labs  Lab 11/01/17 1702 11/03/17 1549  NA 135 136  K 4.5 3.8  CL 99 100*  CO2 26 26  GLUCOSE 259* 172*  BUN 19 13  CREATININE 1.06 0.94  CALCIUM 9.7 9.2   Liver Function Tests: Recent Labs  Lab 11/01/17 1702 11/03/17 1549  AST 15 24  ALT 11 16  ALKPHOS 103 117  BILITOT 0.5 1.2  PROT 7.6 7.8  ALBUMIN 4.2 4.3   No results for input(s): LIPASE, AMYLASE in the  last 168 hours. No results for input(s): AMMONIA in the last 168 hours. CBC: Recent Labs  Lab 11/03/17 1549  WBC 7.8  NEUTROABS 5.8  HGB 13.9  HCT 40.9  MCV 81.6  PLT 289   Cardiac Enzymes: Recent Labs  Lab 11/03/17 1549  CKTOTAL 209  TROPONINI <0.03   BNP: Invalid input(s): POCBNP CBG: Recent Labs  Lab 11/07/17 0605 11/07/17 1111 11/07/17 1640 11/07/17 2257 11/08/17 0559  GLUCAP 138* 200* 168* 245* 174*   D-Dimer No results for input(s): DDIMER in the last 72 hours. Hgb A1c No results for input(s): HGBA1C in the last 72 hours. Lipid Profile No results for input(s): CHOL, HDL, LDLCALC, TRIG, CHOLHDL, LDLDIRECT in the last 72 hours. Thyroid function studies No results for input(s): TSH, T4TOTAL, T3FREE, THYROIDAB in the last 72 hours.  Invalid input(s): FREET3 Anemia work up No results for input(s): VITAMINB12, FOLATE, FERRITIN, TIBC, IRON, RETICCTPCT in the last 72 hours. Urinalysis    Component Value Date/Time   COLORURINE YELLOW 11/03/2017 1535   APPEARANCEUR CLEAR 11/03/2017 1535   LABSPEC 1.017 11/03/2017 1535   PHURINE 5.0 11/03/2017 1535   GLUCOSEU 50 (A) 11/03/2017 1535   GLUCOSEU NEGATIVE 05/27/2015 1236   HGBUR  NEGATIVE 11/03/2017 1535   HGBUR negative 01/30/2009 1506   BILIRUBINUR NEGATIVE 11/03/2017 1535   BILIRUBINUR Neg 11/09/2012 0901   KETONESUR NEGATIVE 11/03/2017 1535   PROTEINUR NEGATIVE 11/03/2017 1535   UROBILINOGEN 0.2 05/27/2015 1236   NITRITE NEGATIVE 11/03/2017 1535   LEUKOCYTESUR SMALL (A) 11/03/2017 1535   Sepsis Labs Invalid input(s): PROCALCITONIN,  WBC,  LACTICIDVEN Microbiology Recent Results (from the past 240 hour(s))  Urine Culture     Status: Abnormal   Collection Time: 11/03/17  3:36 PM  Result Value Ref Range Status   Specimen Description URINE, CATHETERIZED  Final   Special Requests NONE  Final   Culture (A)  Final    <10,000 COLONIES/mL INSIGNIFICANT GROWTH Performed at [facility], [place] [address] [place], [place] [address]    Report Status 11/04/2017 FINAL  Final  MRSA PCR Screening     Status: None   Collection Time: 11/04/17  1:55 AM  Result Value Ref Range Status   MRSA by PCR NEGATIVE NEGATIVE Final    Comment:        The GeneXpert MRSA Assay (FDA approved for NASAL specimens only), is one component of a comprehensive MRSA colonization surveillance program. It is not intended to diagnose MRSA infection nor to guide or monitor treatment for MRSA infections.      Time coordinating discharge: Over 30 minutes  SIGNED:   Cordelia Poche, MD [facility] 11/08/2017, 11:35 AM Pager [phone]  If 7PM-7AM, please contact night-coverage [email or web address] Password [email or web address]

## 2017-11-07 NOTE — Evaluation (Signed)
Speech Language Pathology Evaluation Patient Details Name: Janet Steele MRN: [id number] DOB: 1929/08/25 Today's Date: 11/07/2017 Time: 1660-6301 SLP Time Calculation (min) (ACUTE ONLY): 18 min  Problem List:  Patient Active Problem List   Diagnosis Date Noted  . Stroke ([place]) 11/04/2017  . Weakness 11/03/2017  . CVA (cerebral vascular accident) ([place]) 11/03/2017  . Viral upper respiratory tract infection 11/01/2017  . Chronic back pain 07/04/2017  . Rib pain on left side 04/20/2017  . PAD (peripheral artery disease) ([place]) 01/17/2017  . Overactive bladder 12/17/2016  . Anemia 05/20/2016  . Coronary artery disease involving coronary bypass graft of native heart without angina pectoris 05/19/2016  . Numbness and tingling of foot 12/31/2015  . MCI (mild cognitive impairment) with memory loss 11/18/2015  . Facial tic 05/07/2015  . Facial nerve spasm 05/07/2015  . Sick sinus syndrome ([place]) 02/26/2015  . Cervical dystonia 12/04/2014  . Chronic motor tic 09/12/2014  . Nonspecific abnormal electrocardiogram (ECG) (EKG)   . Unstable angina ([place]) 03/02/2014  . Pacemaker-Medtronic 07/07/2012  . Bradycardia 07/05/2012  . GERD (gastroesophageal reflux disease) 07/05/2012  . Near syncope 12/23/2011  . Abnormal involuntary movement 12/17/2009  . Disturbance in sleep behavior 12/11/2009  . CAD, ARTERY BYPASS GRAFT 09/24/2009  . CAROTID BRUIT 09/24/2009  . Diabetes ([place]) 01/30/2009  . DYSPHAGIA 06/19/2008  . Anxiety 02/14/2008  . EROSIVE ESOPHAGITIS 02/14/2008  . CONSTIPATION, CHRONIC 02/14/2008  . DEGENERATIVE JOINT DISEASE 02/14/2008  . HEARING LOSS 01/04/2008  . PVD 01/04/2008  . Hypothyroidism 09/25/2007  . HYPERLIPIDEMIA 09/25/2007  . Essential hypertension 09/07/2007  . Diabetic polyneuropathy ([place]) 03/16/2007  . Stricture and stenosis of esophagus 03/16/2007  . HIATAL HERNIA 02/16/2007  . HEMORRHOIDS 12/31/1997  . DIVERTICULOSIS, COLON 12/31/1997   Past Medical History:   Past Medical History:  Diagnosis Date  . Anemia   . Anxiety   . Arthritis    "fingers" (05/19/2016  . CAD (coronary artery disease)    a. s/p CABG 1982 b. redo CABG 2003. c. [place] despite med rx 05/2016: successful but complicated PCI of PDA beyond graft site, c/b localized dissection requiring overlapping stent.  . Carotid bruit    a. Duplex 01/2015: patent vessels, 1-39% BICA, f/u PRN recommened.  . Colon polyp    adenomatous  . Colon, diverticulosis   . Degenerative joint disease   . Diastolic dysfunction   . Esophageal stricture   . GERD (gastroesophageal reflux disease)   . Hiatal hernia   . History of blood transfusion    "related to my bypass"  . Hyperlipidemia   . Hypertension   . Hypothyroidism   . Ischemic cardiomyopathy    a. Cath 05/2016: EF 40% with severe inferior wall HK, suspect hibernating myocardium.  . Myocardial infarction ([place]) 1977; 1982  . Osteoarthrosis, unspecified whether generalized or localized, unspecified site   . Presence of permanent cardiac pacemaker   . PVD (peripheral vascular disease) ([place])   . Type II diabetes mellitus ([place])   . Unspecified hearing loss    Past Surgical History:  Past Surgical History:  Procedure Laterality Date  . ABDOMINAL HYSTERECTOMY    . APPENDECTOMY    . BACK SURGERY    . CARDIAC CATHETERIZATION N/A 05/19/2016   Procedure: Left Heart Cath and Cors/Grafts Angiography;  Surgeon: Belva Crome, MD;  Location: [facility];  Service: Cardiovascular;  Laterality: N/A;  . CARDIAC CATHETERIZATION N/A 05/19/2016   Procedure: Coronary Stent Intervention;  Surgeon: Belva Crome, MD;  Location: [facility];  Service: Cardiovascular;  Laterality: N/A;  . CATARACT EXTRACTION W/ INTRAOCULAR LENS  IMPLANT, BILATERAL Bilateral   . CHOLECYSTECTOMY OPEN    . COLONOSCOPY    . CORONARY ANGIOPLASTY WITH STENT PLACEMENT  05/19/2016   "2 stents?"  . CORONARY ARTERY BYPASS GRAFT  1982; 2003   x4(1982),02-2002 CABG X2  .  ESOPHAGOGASTRODUODENOSCOPY (EGD) WITH ESOPHAGEAL DILATION  "2-3 times"  . INSERT / REPLACE / REMOVE PACEMAKER    . LEFT HEART CATHETERIZATION WITH CORONARY/GRAFT ANGIOGRAM N/A 03/05/2014   Procedure: LEFT HEART CATHETERIZATION WITH Beatrix Fetters;  Surgeon: Sinclair Grooms, MD;  Location: [facility];  Service: Cardiovascular;  Laterality: N/A;  . LUMBAR [place] SURGERY  01-2000  . OOPHORECTOMY Bilateral   . PERMANENT PACEMAKER INSERTION N/A 07/06/2012   MDT Adapta L implanted by Dr Rayann Heman for symptomatic bradycardia  . TONSILLECTOMY  1930s   HPI:  83 yo female adm to [facility] with left sided weakness.  PMH + for subacute left basal ganglia cva.  CXR 11/29 showed COPD, nothing acute.  Pt imaging of brain showed left centrum semioval cva.  Pt did not pass RNSSS and swallow eval ordered.  Pt did well yesterday with eval but was overtly coughing with intake of solids.  RN called SLP to share said information and SLP modified diet to full liquids with plan for MBS next date.  Unfortunately, unable to complete MBS today.  However per conversation with pt, she has h/o "coughing with eating for a long time".  She states her esophagus has required stretching x2 (05/2015 and once since per pt) with improvement of swallow.  She states she is "due again" for stretching - advised her to follow up with GI as an OP to faciliate as needed.  SLP visit to determine readiness for dietary advancement.  Pt denies changes with swallowing currently and wants solid foods.  .    Assessment / Plan / Recommendation Clinical Impression  Pt presents with mild expressive aphasia c/b by semantic paraphasias and groping for words. Pt states that the "words just won't come out." Pt with hyperverbal disorganized circumlocutions. Pt is able to follow simple directions and answer yes/no quesitons, sustain attention to task but isn't able to demonstrate any safety awareness. Recommend that skilled ST follow pt during acute stay for  expressive difficulties with ST follow up at next venue of care. Of note, PT and nursing report reported difficulty when pt was consuming a sandwich. Pt consumed graham crackers with peanut butter and thin liquids during SLE. Pt didn't demonstrate any overt s/s of aspiration. She continues to appear appropriate for regular diet as indicated in recent MBS.     SLP Assessment  SLP Recommendation/Assessment: Patient needs continued Speech Lanaguage Pathology Services SLP Visit Diagnosis: Aphasia (R47.01)    Follow Up Recommendations  Home health SLP    Frequency and Duration min 2x/week  2 weeks      SLP Evaluation Cognition  Overall Cognitive Status: No family/caregiver present to determine baseline cognitive functioning Arousal/Alertness: Awake/alert Orientation Level: Oriented to person;Oriented to time;Disoriented to situation;Disoriented to place Attention: Sustained Sustained Attention: Appears intact Memory: (Might be related to expressive language deficits) Awareness: Impaired Awareness Impairment: Intellectual impairment Problem Solving: Appears intact Behaviors: Restless Safety/Judgment: Impaired       Comprehension  Auditory Comprehension Overall Auditory Comprehension: Appears within functional limits for tasks assessed Yes/No Questions: Within Functional Limits Commands: Within Functional Limits(Simple) Conversation: Simple Interfering Components: Hearing EffectiveTechniques: Repetition;Increased volume Visual Recognition/Discrimination Discrimination:  Not tested Reading Comprehension Reading Status: Not tested    Expression Expression Primary Mode of Expression: Verbal Verbal Expression Overall Verbal Expression: Impaired Initiation: No impairment Automatic Speech: Name;Social Response;Day of week;Month of year Level of Generative/Spontaneous Verbalization: Sentence;Conversation Repetition: No impairment Naming: Impairment Responsive: 26-50%  accurate Confrontation: Impaired Convergent: 25-49% accurate Verbal Errors: Aware of errors;Semantic paraphasias Pragmatics: No impairment Effective Techniques: Open ended questions Non-Verbal Means of Communication: Not applicable Written Expression Dominant Hand: Right Written Expression: Not tested   Oral / Motor  Oral Motor/Sensory Function Overall Oral Motor/Sensory Function: Within functional limits Motor Speech Overall Motor Speech: Appears within functional limits for tasks assessed Respiration: Within functional limits Phonation: Normal Resonance: Within functional limits Articulation: Within functional limitis Intelligibility: Intelligible Motor Planning: Witnin functional limits Motor Speech Errors: Not applicable   GO                    Tyna Huertas 11/07/2017, 3:47 PM

## 2017-11-07 NOTE — Progress Notes (Signed)
Physical Therapy Treatment Patient Details Name: Janet Steele MRN: [id number] DOB: November 04, 1929 Today's Date: 11/07/2017    History of Present Illness Patient is a 83 y/o female who presents with right sided weakness and polyuria. Head CT- Age-indeterminate perforator infarct in the left basal ganglia. PMH includes dementia, DM, PVD, pacemaker ,HTN, MI, CAD, ischemic cardiomyopathy.     PT Comments    Patient progressing slowly towards PT goals. Tolerated standing balance and weight shifting holding onto back of chair for support and Min-Mod A for balance. Pt with episode of coughing spell, and possibly aspirating after taking a bite of her sandwich. RN notified. Pt fearful of falling. Continues to demonstrate right sided hemiparesis and balance deficits. Eager to get to SNF. Will follow.  Follow Up Recommendations  SNF;Supervision for mobility/OOB;Supervision/Assistance - 24 hour     Equipment Recommendations  None recommended by PT    Recommendations for Other Services       Precautions / Restrictions Precautions Precautions: Fall Restrictions Weight Bearing Restrictions: No    Mobility  Bed Mobility Overal bed mobility: Needs Assistance Bed Mobility: Rolling;Sidelying to Sit Rolling: Min guard Sidelying to sit: Min assist;HOB elevated       General bed mobility comments: Able to reach for left rail, assist with elevating trunk to get balanced sitting EOB. Increased time.  Transfers Overall transfer level: Needs assistance Equipment used: 1 person hand held assist Transfers: Sit to/from [facility] Sit to Stand: Min assist Stand pivot transfers: Mod assist       General transfer comment: assist to power up into standing, assist for balance, and assist to pivot. Used back of chair to assist with pulling up. Stood from EOB x2, Right knee instability noted.   Ambulation/Gait             General Gait Details: Deferred.   Stairs             Wheelchair Mobility    Modified Rankin (Stroke Patients Only) Modified Rankin (Stroke Patients Only) Pre-Morbid Rankin Score: Moderately severe disability Modified Rankin: Moderately severe disability     Balance Overall balance assessment: Needs assistance Sitting-balance support: Feet supported;No upper extremity supported Sitting balance-Leahy Scale: Fair Sitting balance - Comments: Able to sit statically without UE support but requires close Min guard for dynamic sitting/scooting towards EOB.   Standing balance support: During functional activity;Bilateral upper extremity supported Standing balance-Leahy Scale: Poor Standing balance comment: Requires BUE support on back of chair and assist for right knee stability. Tolerated weight shifting to right/left with Min A for support. Able to initiate thoracic and lumbar extensors with cues but not able to sustain.                            Cognition Arousal/Alertness: Awake/alert Behavior During Therapy: WFL for tasks assessed/performed Overall Cognitive Status: No family/caregiver present to determine baseline cognitive functioning                   Orientation Level: Disoriented to;Time;Situation Current Attention Level: Selective Memory: Decreased short-term memory Following Commands: Follows multi-step commands consistently Safety/Judgement: Decreased awareness of deficits Awareness: Intellectual Problem Solving: Difficulty sequencing;Requires verbal cues General Comments: Pt with h/o dementia       Exercises      General Comments        Pertinent Vitals/Pain Pain Assessment: No/denies pain    Home Living  Prior Function            PT Goals (current goals can now be found in the care plan section) Progress towards PT goals: Progressing toward goals    Frequency    Min 3X/week      PT Plan Current plan remains appropriate    Co-evaluation               AM-PAC PT "6 Clicks" Daily Activity  Outcome Measure  Difficulty turning over in bed (including adjusting bedclothes, sheets and blankets)?: None Difficulty moving from lying on back to sitting on the side of the bed? : Unable Difficulty sitting down on and standing up from a chair with arms (e.g., wheelchair, bedside commode, etc,.)?: Unable Help needed moving to and from a bed to chair (including a wheelchair)?: A Lot Help needed walking in hospital room?: Total Help needed climbing 3-5 steps with a railing? : Total 6 Click Score: 10    End of Session Equipment Utilized During Treatment: Gait belt Activity Tolerance: Patient tolerated treatment well Patient left: in chair;with call bell/phone within reach;with chair alarm set;with nursing/sitter in room Nurse Communication: Mobility status;Other (comment)(coughing and possible aspirating) PT Visit Diagnosis: Hemiplegia and hemiparesis;Difficulty in walking, not elsewhere classified (R26.2);Unsteadiness on feet (R26.81);Other abnormalities of gait and mobility (R26.89) Hemiplegia - Right/Left: Right Hemiplegia - caused by: Cerebral infarction     Time: 5003-7048 PT Time Calculation (min) (ACUTE ONLY): 27 min  Charges:  $Therapeutic Activity: 8-22 mins $Neuromuscular Re-education: 8-22 mins                    G Codes:       Wray Kearns, PT, DPT [phone]     Marguarite Arbour A Ladeidra Borys 11/07/2017, 3:27 PM

## 2017-11-08 DIAGNOSIS — G629 Polyneuropathy, unspecified: Secondary | ICD-10-CM | POA: Diagnosis not present

## 2017-11-08 DIAGNOSIS — E1142 Type 2 diabetes mellitus with diabetic polyneuropathy: Secondary | ICD-10-CM | POA: Diagnosis not present

## 2017-11-08 DIAGNOSIS — R278 Other lack of coordination: Secondary | ICD-10-CM | POA: Diagnosis not present

## 2017-11-08 DIAGNOSIS — I69320 Aphasia following cerebral infarction: Secondary | ICD-10-CM | POA: Diagnosis not present

## 2017-11-08 DIAGNOSIS — G464 Cerebellar stroke syndrome: Secondary | ICD-10-CM | POA: Diagnosis not present

## 2017-11-08 DIAGNOSIS — F0391 Unspecified dementia with behavioral disturbance: Secondary | ICD-10-CM | POA: Diagnosis not present

## 2017-11-08 DIAGNOSIS — I495 Sick sinus syndrome: Secondary | ICD-10-CM | POA: Diagnosis not present

## 2017-11-08 DIAGNOSIS — I6389 Other cerebral infarction: Secondary | ICD-10-CM | POA: Diagnosis not present

## 2017-11-08 DIAGNOSIS — R2689 Other abnormalities of gait and mobility: Secondary | ICD-10-CM | POA: Diagnosis not present

## 2017-11-08 DIAGNOSIS — F0390 Unspecified dementia without behavioral disturbance: Secondary | ICD-10-CM | POA: Diagnosis not present

## 2017-11-08 DIAGNOSIS — M1990 Unspecified osteoarthritis, unspecified site: Secondary | ICD-10-CM | POA: Diagnosis not present

## 2017-11-08 DIAGNOSIS — E039 Hypothyroidism, unspecified: Secondary | ICD-10-CM | POA: Diagnosis not present

## 2017-11-08 DIAGNOSIS — F39 Unspecified mood [affective] disorder: Secondary | ICD-10-CM | POA: Diagnosis not present

## 2017-11-08 DIAGNOSIS — I2510 Atherosclerotic heart disease of native coronary artery without angina pectoris: Secondary | ICD-10-CM | POA: Diagnosis not present

## 2017-11-08 DIAGNOSIS — E782 Mixed hyperlipidemia: Secondary | ICD-10-CM | POA: Diagnosis not present

## 2017-11-08 DIAGNOSIS — R259 Unspecified abnormal involuntary movements: Secondary | ICD-10-CM | POA: Diagnosis not present

## 2017-11-08 DIAGNOSIS — E1165 Type 2 diabetes mellitus with hyperglycemia: Secondary | ICD-10-CM | POA: Diagnosis not present

## 2017-11-08 DIAGNOSIS — Z950 Presence of cardiac pacemaker: Secondary | ICD-10-CM | POA: Diagnosis not present

## 2017-11-08 DIAGNOSIS — I10 Essential (primary) hypertension: Secondary | ICD-10-CM | POA: Diagnosis not present

## 2017-11-08 DIAGNOSIS — I69951 Hemiplegia and hemiparesis following unspecified cerebrovascular disease affecting right dominant side: Secondary | ICD-10-CM | POA: Diagnosis not present

## 2017-11-08 DIAGNOSIS — I739 Peripheral vascular disease, unspecified: Secondary | ICD-10-CM | POA: Diagnosis not present

## 2017-11-08 DIAGNOSIS — E785 Hyperlipidemia, unspecified: Secondary | ICD-10-CM | POA: Diagnosis not present

## 2017-11-08 DIAGNOSIS — E119 Type 2 diabetes mellitus without complications: Secondary | ICD-10-CM | POA: Diagnosis not present

## 2017-11-08 DIAGNOSIS — R4182 Altered mental status, unspecified: Secondary | ICD-10-CM | POA: Diagnosis not present

## 2017-11-08 DIAGNOSIS — R1312 Dysphagia, oropharyngeal phase: Secondary | ICD-10-CM | POA: Diagnosis not present

## 2017-11-08 DIAGNOSIS — K219 Gastro-esophageal reflux disease without esophagitis: Secondary | ICD-10-CM | POA: Diagnosis not present

## 2017-11-08 DIAGNOSIS — M6281 Muscle weakness (generalized): Secondary | ICD-10-CM | POA: Diagnosis not present

## 2017-11-08 DIAGNOSIS — I639 Cerebral infarction, unspecified: Secondary | ICD-10-CM | POA: Diagnosis not present

## 2017-11-08 LAB — GLUCOSE, CAPILLARY
GLUCOSE-CAPILLARY: 174 mg/dL — AB (ref 65–99)
Glucose-Capillary: 225 mg/dL — ABNORMAL HIGH (ref 65–99)

## 2017-11-08 MED ORDER — HALOPERIDOL LACTATE 5 MG/ML IJ SOLN: 2.5000 mg | Freq: Once | INTRAMUSCULAR | Status: DC

## 2017-11-08 MED ORDER — ENSURE ENLIVE PO LIQD: 237.0000 mL | Freq: Two times a day (BID) | ORAL | Status: DC

## 2017-11-08 MED ORDER — ATORVASTATIN CALCIUM 80 MG PO TABS: 80.0000 mg | ORAL_TABLET | Freq: Every day | ORAL | Status: DC

## 2017-11-08 NOTE — Progress Notes (Signed)
CSW spoke with patient's son, Abe People, to discuss bed offers received and determine facility preference. Patient's son wants [facility], and they have a bed available. Patient's PASRR number has been received, as well. CSW alerted MD that patient has bed available and can discharge to SNF if medically stable.  CSW to continue to follow.  Laveda Abbe, [place] Clinical Social Worker [phone]

## 2017-11-08 NOTE — Clinical Social Work Placement (Signed)
Nurse to call report to [phone], Room [place]  NOTE  Date:  11/08/2017  Patient Details  Name: Janet Steele MRN: [id number] Date of Birth: 1929-07-07  Clinical Social Work is seeking post-discharge placement for this patient at the [place] level of care (*CSW will initial, date and re-position this form in  chart as items are completed):  Yes   Patient/family provided with [place] Work Department's list of facilities offering this level of care within the geographic area requested by the patient (or if unable, by the patient's family).  Yes   Patient/family informed of their freedom to choose among providers that offer the needed level of care, that participate in Medicare, Medicaid or managed care program needed by the patient, have an available bed and are willing to accept the patient.  Yes   Patient/family informed of 's ownership interest in [facility] and [facility], as well as of the fact that they are under no obligation to receive care at these facilities.  PASRR submitted to EDS on 11/05/17     PASRR number received on 11/07/17     Existing PASRR number confirmed on       FL2 transmitted to all facilities in geographic area requested by pt/family on       FL2 transmitted to all facilities within larger geographic area on 11/05/17     Patient informed that his/her managed care company has contracts with or will negotiate with certain facilities, including the following:        Yes   Patient/family informed of bed offers received.  Patient chooses bed at [facility]     Physician recommends and patient chooses bed at      Patient to be transferred to [facility] on 11/08/17.  Patient to be transferred to facility by PTAR     Patient family notified on 11/08/17 of transfer.  Name of family member notified:  Bill     PHYSICIAN        Additional Comment:    _______________________________________________ Geralynn Ochs, LCSW 11/08/2017, 2:16 PM

## 2017-11-08 NOTE — Progress Notes (Signed)
Pt transported via PT[facility] to [facility] with all personal belongings. Attempted to call report to nurse but was unsuccessful left message with secretary at the facility to have nurse call back.

## 2017-11-08 NOTE — Care Management Note (Signed)
Case Management Note  Patient Details  Name: Janet Steele MRN: [id number] Date of Birth: 01/10/1929  Subjective/Objective:                    Action/Plan: Pt discharging to [facility] today. No further needs per CM.  Expected Discharge Date:                  Expected Discharge Plan:  Assisted Living / Rest Home  In-House Referral:  Clinical Social Work  Discharge planning Services     Post Acute Care Choice:    Choice offered to:     DME Arranged:    DME Agency:     HH Arranged:    [place] Agency:     Status of Service:  Completed, signed off  If discussed at [employer], dates discussed:    Additional Comments:  Pollie Friar, RN 11/08/2017, 11:22 AM

## 2017-11-08 NOTE — Progress Notes (Signed)
Patient is displaying increased confusion than what was reported from off going RN. Patient is alert & oriented to self and place. She is increasingly irritated because she wanted to go home. She's incontinent and had soiled herself but refuses to allow the nursing staff to clean her. She also Continueds to attempt to get out of bed. Refusing evening meds. Notified covering [place] of the patient's changes. Was given an order for 0.5mg  of ativan. Will administer as ordered and will continue to monitor patient for safety.

## 2017-11-08 NOTE — Progress Notes (Signed)
Attempted x2 to call report to [facility] but was unsuccessful.

## 2017-11-08 NOTE — Progress Notes (Signed)
Occupational Therapy Treatment Patient Details Name: Janet Steele MRN: [id number] DOB: [birth year] Today's Date: 11/08/2017    History of present illness Patient is a 83 y/o female who presents with right sided weakness and polyuria. Head CT- Age-indeterminate perforator infarct in the left basal ganglia. PMH includes dementia, DM, PVD, pacemaker ,HTN, MI, CAD, ischemic cardiomyopathy.    OT comments  Pt continues to present with decreased cognition, strength, and balance. Pt performing grooming at EOB with Min A. Pt continues to demonstrate fear of falling and requires increased encouragement for participation.  Pt planning to dc to SNF later today. Will follow as admitted.   Follow Up Recommendations  SNF;Supervision/Assistance - 24 hour    Equipment Recommendations  None recommended by OT    Recommendations for Other Services      Precautions / Restrictions Precautions Precautions: Fall Restrictions Weight Bearing Restrictions: No       Mobility Bed Mobility Overal bed mobility: Needs Assistance Bed Mobility: Rolling;Sidelying to Sit     Supine to sit: HOB elevated;Min assist Sit to supine: Min assist   General bed mobility comments: Min A for managing BLEs  Transfers Overall transfer level: Needs assistance Equipment used: Rolling walker (2 wheeled) Transfers: Sit to/from [facility] Sit to Stand: Mod assist         General transfer comment: Pt performing sit<>standing with Mod A and RW. limited by weakness    Balance Overall balance assessment: Needs assistance Sitting-balance support: Feet supported;No upper extremity supported Sitting balance-Leahy Scale: Fair Sitting balance - Comments: Able to sit statically without UE support but requires close Min guard for dynamic sitting/scooting towards EOB. Postural control: Posterior lean Standing balance support: During functional activity;Bilateral upper extremity supported Standing  balance-Leahy Scale: Poor Standing balance comment: Requires BUE support on back of chair and assist for right knee stability. Tolerated weight shifting to right/left with Min A for support. Able to initiate thoracic and lumbar extensors with cues but not able to sustain.                           ADL either performed or assessed with clinical judgement   ADL Overall ADL's : Needs assistance/impaired     Grooming: Wash/dry face;Oral care;Minimal assistance;Sitting Grooming Details (indicate cue type and reason): Pt performing grooming at EOB. Attempt to stand for ADLs at sink but pt limited by fatigue, weakness, and cognitive deficits.                                General ADL Comments: Pt performing grooming at EOB with Min A and VCs for attention to task.      Vision   Vision Assessment?: Yes Eye Alignment: Within Functional Limits Ocular Range of Motion: Within Functional Limits Alignment/Gaze Preference: Within Defined Limits Tracking/Visual Pursuits: Able to track stimulus in all quads without difficulty Visual Fields: No apparent deficits   Perception     Praxis      Cognition Arousal/Alertness: Awake/alert Behavior During Therapy: WFL for tasks assessed/performed Overall Cognitive Status: No family/caregiver present to determine baseline cognitive functioning Area of Impairment: Orientation;Memory;Following commands;Safety/judgement;Problem solving;Attention;Awareness                 Orientation Level: Disoriented to;Time;Situation Current Attention Level: Selective Memory: Decreased short-term memory Following Commands: Follows multi-step commands consistently Safety/Judgement: Decreased awareness of deficits Awareness: Intellectual Problem Solving: Difficulty sequencing;Requires verbal cues General  Comments: Pt with h/o dementia         Exercises     Shoulder Instructions       General Comments      Pertinent Vitals/  Pain       Pain Assessment: No/denies pain  Home Living                                          Prior Functioning/Environment              Frequency  Min 2X/week        Progress Toward Goals  OT Goals(current goals can now be found in the care plan section)  Progress towards OT goals: Progressing toward goals  Acute Rehab OT Goals Patient Stated Goal: to get back to normal  OT Goal Formulation: With patient Time For Goal Achievement: 11/19/17 Potential to Achieve Goals: Good ADL Goals Pt Will Perform Grooming: with min assist;standing Pt Will Perform Upper Body Bathing: sitting;with supervision Pt Will Perform Lower Body Bathing: with min assist;sit to/from stand Pt Will Perform Upper Body Dressing: with supervision;sitting Pt Will Perform Lower Body Dressing: with min assist;sit to/from stand Pt Will Transfer to Toilet: with min assist;stand pivot transfer;bedside commode Pt Will Perform Toileting - Clothing Manipulation and hygiene: with min assist;sit to/from stand  Plan Discharge plan remains appropriate    Co-evaluation                 AM-PAC PT "6 Clicks" Daily Activity     Outcome Measure   Help from another person eating meals?: A Little Help from another person taking care of personal grooming?: A Little Help from another person toileting, which includes using toliet, bedpan, or urinal?: A Lot Help from another person bathing (including washing, rinsing, drying)?: A Lot Help from another person to put on and taking off regular upper body clothing?: A Lot Help from another person to put on and taking off regular lower body clothing?: A Lot 6 Click Score: 14    End of Session Equipment Utilized During Treatment: Gait belt;Rolling walker  OT Visit Diagnosis: Unsteadiness on feet (R26.81);Hemiplegia and hemiparesis Hemiplegia - Right/Left: Right Hemiplegia - caused by: Cerebral infarction   Activity Tolerance Patient tolerated  treatment well   Patient Left in bed;with call bell/phone within reach;with bed alarm set   Nurse Communication Mobility status        Time: 1435-1451 OT Time Calculation (min): 16 min  Charges: OT General Charges $OT Visit: 1 Visit OT Treatments $Self Care/Home Management : 8-22 mins  [place], OTR/L Acute Rehab Pager: [phone] Office: [place] 11/08/2017, 6:02 PM

## 2017-11-08 NOTE — Care Management Important Message (Signed)
Important Message  Patient Details  Name: Janet Steele MRN: [id number] Date of Birth: [birth year]   Medicare Important Message Given:  Yes    Gaylia Kassel Abena 11/08/2017, 9:08 AM

## 2017-11-08 NOTE — Progress Notes (Signed)
The patient seemed to calm down after receiving 0.5mg  of ativan. She allowed the staff to give her a bath. However she continues to refuse her evening medication.

## 2017-11-09 DIAGNOSIS — I10 Essential (primary) hypertension: Secondary | ICD-10-CM | POA: Diagnosis not present

## 2017-11-09 DIAGNOSIS — I495 Sick sinus syndrome: Secondary | ICD-10-CM | POA: Diagnosis not present

## 2017-11-09 DIAGNOSIS — I6389 Other cerebral infarction: Secondary | ICD-10-CM | POA: Diagnosis not present

## 2017-11-09 DIAGNOSIS — E119 Type 2 diabetes mellitus without complications: Secondary | ICD-10-CM | POA: Diagnosis not present

## 2017-11-10 ENCOUNTER — Telehealth: Payer: Self-pay | Admitting: *Deleted

## 2017-11-10 NOTE — Telephone Encounter (Signed)
Pt was on TCM list admitted 11/03/17 for LE weakness. After testing dx w/stroke. Pt has been d/c to SNF 11/08/17...[name]

## 2017-11-11 DIAGNOSIS — F39 Unspecified mood [affective] disorder: Secondary | ICD-10-CM | POA: Diagnosis not present

## 2017-11-11 DIAGNOSIS — I10 Essential (primary) hypertension: Secondary | ICD-10-CM | POA: Diagnosis not present

## 2017-11-11 DIAGNOSIS — E039 Hypothyroidism, unspecified: Secondary | ICD-10-CM | POA: Diagnosis not present

## 2017-11-11 DIAGNOSIS — I639 Cerebral infarction, unspecified: Secondary | ICD-10-CM | POA: Diagnosis not present

## 2017-11-11 DIAGNOSIS — F0391 Unspecified dementia with behavioral disturbance: Secondary | ICD-10-CM | POA: Diagnosis not present

## 2017-11-11 DIAGNOSIS — K219 Gastro-esophageal reflux disease without esophagitis: Secondary | ICD-10-CM | POA: Diagnosis not present

## 2017-11-11 DIAGNOSIS — I2510 Atherosclerotic heart disease of native coronary artery without angina pectoris: Secondary | ICD-10-CM | POA: Diagnosis not present

## 2017-11-11 DIAGNOSIS — I739 Peripheral vascular disease, unspecified: Secondary | ICD-10-CM | POA: Diagnosis not present

## 2017-11-16 ENCOUNTER — Ambulatory Visit: Payer: Medicare Other | Admitting: Neurology

## 2017-11-17 ENCOUNTER — Other Ambulatory Visit: Payer: Self-pay | Admitting: *Deleted

## 2017-11-17 NOTE — Patient Outreach (Signed)
[facility]) Care Management  11/17/2017  Janet Steele 02-28-1929 [id number]  Per Vickii Chafe SW at facility. She states patient will be going to ALF upon discharge.  Requested that SW know if patient does not go to ALF and RNCM can meet with patient regarding discharge planning needs.  Plan to sign off at this time. Royetta Crochet. Laymond Purser, RN, BSN, [place] [phone]) Business Cell  [phone]) Toll Free Office

## 2017-11-18 DIAGNOSIS — I10 Essential (primary) hypertension: Secondary | ICD-10-CM | POA: Diagnosis not present

## 2017-11-18 DIAGNOSIS — I2510 Atherosclerotic heart disease of native coronary artery without angina pectoris: Secondary | ICD-10-CM | POA: Diagnosis not present

## 2017-11-18 DIAGNOSIS — E119 Type 2 diabetes mellitus without complications: Secondary | ICD-10-CM | POA: Diagnosis not present

## 2017-11-18 DIAGNOSIS — I6389 Other cerebral infarction: Secondary | ICD-10-CM | POA: Diagnosis not present

## 2017-11-25 DIAGNOSIS — I6389 Other cerebral infarction: Secondary | ICD-10-CM | POA: Diagnosis not present

## 2017-11-25 DIAGNOSIS — I2510 Atherosclerotic heart disease of native coronary artery without angina pectoris: Secondary | ICD-10-CM | POA: Diagnosis not present

## 2017-11-25 DIAGNOSIS — I10 Essential (primary) hypertension: Secondary | ICD-10-CM | POA: Diagnosis not present

## 2017-11-25 DIAGNOSIS — E119 Type 2 diabetes mellitus without complications: Secondary | ICD-10-CM | POA: Diagnosis not present

## 2017-12-05 DIAGNOSIS — F0390 Unspecified dementia without behavioral disturbance: Secondary | ICD-10-CM | POA: Diagnosis not present

## 2017-12-05 DIAGNOSIS — I2510 Atherosclerotic heart disease of native coronary artery without angina pectoris: Secondary | ICD-10-CM | POA: Diagnosis not present

## 2017-12-05 DIAGNOSIS — E119 Type 2 diabetes mellitus without complications: Secondary | ICD-10-CM | POA: Diagnosis not present

## 2017-12-05 DIAGNOSIS — I6389 Other cerebral infarction: Secondary | ICD-10-CM | POA: Diagnosis not present

## 2017-12-08 ENCOUNTER — Telehealth: Payer: Self-pay | Admitting: Adult Health

## 2017-12-08 NOTE — Telephone Encounter (Signed)
Pt's husband called, he rec'd a msg pt had an upcoming appt on 1/8 with Dr Brett Fairy. He advised the pt had a stroke about a month ago and will not be returning to the clinic anytime soon.  FYI

## 2017-12-09 DIAGNOSIS — F0391 Unspecified dementia with behavioral disturbance: Secondary | ICD-10-CM | POA: Diagnosis not present

## 2017-12-09 DIAGNOSIS — I639 Cerebral infarction, unspecified: Secondary | ICD-10-CM | POA: Diagnosis not present

## 2017-12-09 DIAGNOSIS — E039 Hypothyroidism, unspecified: Secondary | ICD-10-CM | POA: Diagnosis not present

## 2017-12-09 NOTE — Telephone Encounter (Signed)
Hi Megan,  This is probably the very best time to test this patient for apnea- the stroke is still acute. Can we get her through for a watch pat? I will cc Robin on this one.  I am very sorry that she had such a health challenge at the end of 2018.  CD

## 2017-12-12 DIAGNOSIS — I6389 Other cerebral infarction: Secondary | ICD-10-CM | POA: Diagnosis not present

## 2017-12-12 DIAGNOSIS — E119 Type 2 diabetes mellitus without complications: Secondary | ICD-10-CM | POA: Diagnosis not present

## 2017-12-12 DIAGNOSIS — I2510 Atherosclerotic heart disease of native coronary artery without angina pectoris: Secondary | ICD-10-CM | POA: Diagnosis not present

## 2017-12-12 DIAGNOSIS — G629 Polyneuropathy, unspecified: Secondary | ICD-10-CM | POA: Diagnosis not present

## 2017-12-13 ENCOUNTER — Ambulatory Visit: Payer: Medicare Other | Admitting: Neurology

## 2017-12-22 DIAGNOSIS — I2510 Atherosclerotic heart disease of native coronary artery without angina pectoris: Secondary | ICD-10-CM | POA: Diagnosis not present

## 2017-12-22 DIAGNOSIS — I6389 Other cerebral infarction: Secondary | ICD-10-CM | POA: Diagnosis not present

## 2017-12-22 DIAGNOSIS — I10 Essential (primary) hypertension: Secondary | ICD-10-CM | POA: Diagnosis not present

## 2017-12-22 DIAGNOSIS — E119 Type 2 diabetes mellitus without complications: Secondary | ICD-10-CM | POA: Diagnosis not present

## 2017-12-29 DIAGNOSIS — I6389 Other cerebral infarction: Secondary | ICD-10-CM | POA: Diagnosis not present

## 2017-12-29 DIAGNOSIS — I10 Essential (primary) hypertension: Secondary | ICD-10-CM | POA: Diagnosis not present

## 2017-12-29 DIAGNOSIS — I2510 Atherosclerotic heart disease of native coronary artery without angina pectoris: Secondary | ICD-10-CM | POA: Diagnosis not present

## 2017-12-29 DIAGNOSIS — E119 Type 2 diabetes mellitus without complications: Secondary | ICD-10-CM | POA: Diagnosis not present

## 2018-01-06 ENCOUNTER — Telehealth: Payer: Self-pay | Admitting: Internal Medicine

## 2018-01-06 DIAGNOSIS — Z8673 Personal history of transient ischemic attack (TIA), and cerebral infarction without residual deficits: Secondary | ICD-10-CM

## 2018-01-06 NOTE — Telephone Encounter (Signed)
Copied from [place]. Topic: Quick Communication - See Telephone Encounter >> Jan 06, 2018 11:06 AM Vernona Rieger wrote: CRM for notification. See Telephone encounter for:   01/06/18. Patient had a stroke about two months ago & her son states that she is being discharged from [facility] today. The doctors there stated she needs a hospital bed for her home, they advised that her son call for a script for this. Call back [phone].

## 2018-01-06 NOTE — Telephone Encounter (Signed)
Order pending. Needs DX code. Will contact son to see where he wants the order sent.

## 2018-01-06 NOTE — Telephone Encounter (Signed)
Spoke with pt's son, Pt is currently at [facility] and would like the bed to come from [facility]. Please sign

## 2018-01-07 DIAGNOSIS — S81001D Unspecified open wound, right knee, subsequent encounter: Secondary | ICD-10-CM | POA: Diagnosis not present

## 2018-01-07 DIAGNOSIS — I69320 Aphasia following cerebral infarction: Secondary | ICD-10-CM | POA: Diagnosis not present

## 2018-01-07 DIAGNOSIS — I10 Essential (primary) hypertension: Secondary | ICD-10-CM | POA: Diagnosis not present

## 2018-01-07 DIAGNOSIS — I69351 Hemiplegia and hemiparesis following cerebral infarction affecting right dominant side: Secondary | ICD-10-CM | POA: Diagnosis not present

## 2018-01-07 DIAGNOSIS — I2510 Atherosclerotic heart disease of native coronary artery without angina pectoris: Secondary | ICD-10-CM | POA: Diagnosis not present

## 2018-01-07 DIAGNOSIS — Z8673 Personal history of transient ischemic attack (TIA), and cerebral infarction without residual deficits: Secondary | ICD-10-CM | POA: Insufficient documentation

## 2018-01-07 DIAGNOSIS — E039 Hypothyroidism, unspecified: Secondary | ICD-10-CM | POA: Diagnosis not present

## 2018-01-07 NOTE — Telephone Encounter (Signed)
done

## 2018-01-09 DIAGNOSIS — I10 Essential (primary) hypertension: Secondary | ICD-10-CM | POA: Diagnosis not present

## 2018-01-09 DIAGNOSIS — E039 Hypothyroidism, unspecified: Secondary | ICD-10-CM | POA: Diagnosis not present

## 2018-01-09 DIAGNOSIS — I69320 Aphasia following cerebral infarction: Secondary | ICD-10-CM | POA: Diagnosis not present

## 2018-01-09 DIAGNOSIS — I2510 Atherosclerotic heart disease of native coronary artery without angina pectoris: Secondary | ICD-10-CM | POA: Diagnosis not present

## 2018-01-09 DIAGNOSIS — I69351 Hemiplegia and hemiparesis following cerebral infarction affecting right dominant side: Secondary | ICD-10-CM | POA: Diagnosis not present

## 2018-01-09 DIAGNOSIS — S81001D Unspecified open wound, right knee, subsequent encounter: Secondary | ICD-10-CM | POA: Diagnosis not present

## 2018-01-09 NOTE — Telephone Encounter (Signed)
Spoke with Ronnell Guadalajara from [facility] and have faxed over orders and demographics for hospital bed. Spoke with Son letting him know that orders have been faxed. Jeneen Rinks advised that they do not provide supplies for the bed, these have to be bought on their own.

## 2018-01-09 NOTE — Telephone Encounter (Signed)
Pts son calling and states that [facility] did not receive the order for a hospital bed for his mom. Please resend.

## 2018-01-09 NOTE — Telephone Encounter (Signed)
Son called again - [facility] can not [facility] bed until 3rd week of feb and son is wanting to know if there is another place to get the bed from. He is also need order for pads and whatever size sheets are needed for the bed.  cb is [phone]

## 2018-01-10 NOTE — Progress Notes (Signed)
Subjective:    Patient ID: Janet Steele, female    DOB: 1929-08-30, 84 y.o.   MRN: [id number]  HPI The patient is here for follow up.  Admitted 11/03/17 for LE weakness for one days.  There were no other symptoms.  She was diagnosed and admitted for a stroke.  It was subacute on the CT per neurology.  She initially passed the swallow evaluation but then had symptoms of dysphagia and was placed on a full liquid diet.  She completed rehab and is currently at home.  She is living with her husband.  Her son is her power of attorney and healthcare proxy.  He is setting up her medications and she is taking them appropriately.  She did not receive a prescription for Lipitor when she left rehab and therefore is not currently taking it.  She is taking all of her other medications.    Recent CVA: She has residual right sided weakness and as a result her balance is not good Her son believes she has all the necessary equipment.  She does have difficulty transferring in and out of bed and that is one reason why they have requested a hospital bed She does have some expressive aphasia Overall her son feels she has improved since the initial stroke She also needs her head elevated.  She does spend a lot of time in bed and does have some dysphasia along with heartburn She is at risk for bedsores because she does spend a lot of time in bed and will need to frequently change positions She is doing speech therapy and occupational therapy and will start physical therapy today She has a home health nurse coming once a week    Diabetes: Her last A1c was 7.1% She is taking her medication as prescribed.  She is compliant with a low sugar/carbohydrate diet   Hypertension: BP has been low-occupational therapy took it the other day and it was about what it is here  she is taking all 3 medications as prescribed She denies any dizziness or lightheadedness, but is a poor historian She is at an increased risk  of falls due to poor balance and right-sided weakness from her stroke We will discontinue amlodipine for now Continue losartan and metoprolol She has a home health nurse and physical therapy that will help monitor her blood pressure at home  Hypothyroidism: Continue current dose of levothyroxine  Hyperlipidemia: Has not been taking atorvastatin-prescription sent to pharmacy   Medications and allergies reviewed with patient and updated if appropriate.  Patient Active Problem List   Diagnosis Date Noted  . History of CVA (cerebrovascular accident) 01/07/2018  . Stroke ([place]) 11/04/2017  . Weakness 11/03/2017  . CVA (cerebral vascular accident) ([place]) 11/03/2017  . Chronic back pain 07/04/2017  . Rib pain on left side 04/20/2017  . PAD (peripheral artery disease) ([place]) 01/17/2017  . Overactive bladder 12/17/2016  . Anemia 05/20/2016  . Coronary artery disease involving coronary bypass graft of native heart without angina pectoris 05/19/2016  . Numbness and tingling of foot 12/31/2015  . MCI (mild cognitive impairment) with memory loss 11/18/2015  . Facial tic 05/07/2015  . Facial nerve spasm 05/07/2015  . Sick sinus syndrome ([place]) 02/26/2015  . Cervical dystonia 12/04/2014  . Chronic motor tic 09/12/2014  . Nonspecific abnormal electrocardiogram (ECG) (EKG)   . Unstable angina ([place]) 03/02/2014  . Pacemaker-Medtronic 07/07/2012  . Bradycardia 07/05/2012  . GERD (gastroesophageal reflux disease) 07/05/2012  . Near syncope 12/23/2011  .  Abnormal involuntary movement 12/17/2009  . Disturbance in sleep behavior 12/11/2009  . CAD, ARTERY BYPASS GRAFT 09/24/2009  . CAROTID BRUIT 09/24/2009  . Diabetes ([place]) 01/30/2009  . DYSPHAGIA 06/19/2008  . Anxiety 02/14/2008  . EROSIVE ESOPHAGITIS 02/14/2008  . CONSTIPATION, CHRONIC 02/14/2008  . DEGENERATIVE JOINT DISEASE 02/14/2008  . HEARING LOSS 01/04/2008  . PVD 01/04/2008  . Hypothyroidism 09/25/2007  . HYPERLIPIDEMIA 09/25/2007  .  Essential hypertension 09/07/2007  . Diabetic polyneuropathy ([place]) 03/16/2007  . Stricture and stenosis of esophagus 03/16/2007  . HIATAL HERNIA 02/16/2007  . HEMORRHOIDS 12/31/1997  . DIVERTICULOSIS, COLON 12/31/1997    Current Outpatient Medications on File Prior to Visit  Medication Sig Dispense Refill  . acetaminophen (TYLENOL) 500 MG tablet Take 2 tablets (1,000 mg total) by mouth every 6 (six) hours as needed. 30 tablet 0  . amLODipine (NORVASC) 5 MG tablet take 1 tablet by mouth once daily 30 tablet 0  . atorvastatin (LIPITOR) 80 MG tablet Take 1 tablet (80 mg total) by mouth daily at 6 PM.    . clopidogrel (PLAVIX) 75 MG tablet Take 1 tablet (75 mg total) daily by mouth. 15 tablet 0  . DULoxetine (CYMBALTA) 60 MG capsule take 1 capsule by mouth once daily 90 capsule 1  . levothyroxine (SYNTHROID, LEVOTHROID) 150 MCG tablet Take 1 tablet (150 mcg total) by mouth daily. 90 tablet 1  . losartan (COZAAR) 50 MG tablet Take 1 tablet (50 mg total) by mouth daily. 30 tablet 5  . metFORMIN (GLUCOPHAGE) 1000 MG tablet Take 1 tablet (1,000 mg total) by mouth 2 (two) times daily with a meal. 180 tablet 1  . metoprolol tartrate (LOPRESSOR) 25 MG tablet take 1 tablet by mouth twice a day 30 tablet 0  . nitroGLYCERIN (NITROSTAT) 0.4 MG SL tablet Place 1 tablet (0.4 mg total) under the tongue every 5 (five) minutes as needed. For chest pain 25 tablet 5  . pantoprazole (PROTONIX) 40 MG tablet take 1 tablet by mouth twice a day 60 tablet 3  . RA ASPIRIN EC 81 MG EC tablet take 1 tablet by mouth once daily . OVERDUE FOR FOLLOW UP. PLEASE CALL AND SCHEDULE [phone]. 15 tablet 0  . sitaGLIPtin (JANUVIA) 50 MG tablet Take 1 tablet (50 mg total) by mouth daily. 90 tablet 1  . traZODone (DESYREL) 50 MG tablet Take 2 tablets (100 mg total) by mouth at bedtime. 60 tablet 11  . mupirocin ointment (BACTROBAN) 2 %      No current facility-administered medications on file prior to visit.     Past Medical  History:  Diagnosis Date  . Anemia   . Anxiety   . Arthritis    "fingers" (05/19/2016  . CAD (coronary artery disease)    a. s/p CABG 1982 b. redo CABG 2003. c. [place] despite med rx 05/2016: successful but complicated PCI of PDA beyond graft site, c/b localized dissection requiring overlapping stent.  . Carotid bruit    a. Duplex 01/2015: patent vessels, 1-39% BICA, f/u PRN recommened.  . Colon polyp    adenomatous  . Colon, diverticulosis   . Degenerative joint disease   . Diastolic dysfunction   . Esophageal stricture   . GERD (gastroesophageal reflux disease)   . Hiatal hernia   . History of blood transfusion    "related to my bypass"  . Hyperlipidemia   . Hypertension   . Hypothyroidism   . Ischemic cardiomyopathy    a. Cath 05/2016: EF 40% with severe inferior wall  HK, suspect hibernating myocardium.  . Myocardial infarction ([place]) 1977; 1982  . Osteoarthrosis, unspecified whether generalized or localized, unspecified site   . Presence of permanent cardiac pacemaker   . PVD (peripheral vascular disease) ([place])   . Type II diabetes mellitus ([place])   . Unspecified hearing loss     Past Surgical History:  Procedure Laterality Date  . ABDOMINAL HYSTERECTOMY    . APPENDECTOMY    . BACK SURGERY    . CARDIAC CATHETERIZATION N/A 05/19/2016   Procedure: Left Heart Cath and Cors/Grafts Angiography;  Surgeon: Belva Crome, MD;  Location: [facility];  Service: Cardiovascular;  Laterality: N/A;  . CARDIAC CATHETERIZATION N/A 05/19/2016   Procedure: Coronary Stent Intervention;  Surgeon: Belva Crome, MD;  Location: [facility];  Service: Cardiovascular;  Laterality: N/A;  . CATARACT EXTRACTION W/ INTRAOCULAR LENS  IMPLANT, BILATERAL Bilateral   . CHOLECYSTECTOMY OPEN    . COLONOSCOPY    . CORONARY ANGIOPLASTY WITH STENT PLACEMENT  05/19/2016   "2 stents?"  . CORONARY ARTERY BYPASS GRAFT  1982; 2003   x4(1982),02-2002 CABG X2  . ESOPHAGOGASTRODUODENOSCOPY (EGD) WITH  ESOPHAGEAL DILATION  "2-3 times"  . INSERT / REPLACE / REMOVE PACEMAKER    . LEFT HEART CATHETERIZATION WITH CORONARY/GRAFT ANGIOGRAM N/A 03/05/2014   Procedure: LEFT HEART CATHETERIZATION WITH Beatrix Fetters;  Surgeon: Sinclair Grooms, MD;  Location: [facility];  Service: Cardiovascular;  Laterality: N/A;  . LUMBAR [place] SURGERY  01-2000  . OOPHORECTOMY Bilateral   . PERMANENT PACEMAKER INSERTION N/A 07/06/2012   MDT Adapta L implanted by Dr Rayann Heman for symptomatic bradycardia  . TONSILLECTOMY  1930s    Social History   Socioeconomic History  . Marital status: Married    Spouse name: [name]  . Number of children: 3  . Years of education: 82  . Highest education level: None  Social Needs  . Financial resource strain: None  . Food insecurity - worry: None  . Food insecurity - inability: None  . Transportation needs - medical: None  . Transportation needs - non-medical: None  Occupational History  . Occupation: Retired     Fish farm manager: RETIRED  Tobacco Use  . Smoking status: Former Smoker    Packs/day: 0.50    Years: 30.00    Pack years: 15.00    Types: Cigarettes    Last attempt to quit: 07/05/1976    Years since quitting: 41.5  . Smokeless tobacco: Never Used  Substance and Sexual Activity  . Alcohol use: No    Alcohol/week: 1.8 oz    Types: 3 Glasses of wine per week    Frequency: Never    Comment: "not anymore, I haven't in I don't know years"  . Drug use: No  . Sexual activity: No  Other Topics Concern  . None  Social History Narrative   Patient is married [name]) and lives at home with her husband.   Patient has three adult children.   Patient is retired.   Patient has a college education.   Patient is right-handed.   Patient does not drink any caffeine.    Family History  Problem Relation Age of Onset  . Heart disease Sister   . Heart attack Sister   . Colon cancer Neg Hx   . Breast cancer Neg Hx   . Celiac disease Neg Hx   . Cirrhosis Neg Hx    . Clotting disorder Neg Hx   . Colitis Neg Hx   .  Colon polyps Neg Hx   . Crohn's disease Neg Hx   . Cystic fibrosis Neg Hx   . Diabetes Neg Hx   . Esophageal cancer Neg Hx   . Hemochromatosis Neg Hx   . Inflammatory bowel disease Neg Hx   . Irritable bowel syndrome Neg Hx   . Kidney disease Neg Hx   . Liver cancer Neg Hx   . Liver disease Neg Hx   . Ovarian cancer Neg Hx   . Pancreatic cancer Neg Hx   . Prostate cancer Neg Hx   . Rectal cancer Neg Hx   . Stomach cancer Neg Hx   . Ulcerative colitis Neg Hx   . Uterine cancer Neg Hx   . Wilson's disease Neg Hx     Review of Systems  Constitutional: Negative for chills and fever.  HENT: Negative for trouble swallowing.   Respiratory: Positive for cough. Negative for shortness of breath and wheezing.   Cardiovascular: Negative for chest pain and leg swelling.  Gastrointestinal: Negative for abdominal pain, constipation and diarrhea.  Musculoskeletal: Positive for back pain.  Neurological: Positive for dizziness, weakness (right side) and headaches (occ at night). Negative for light-headedness.  Psychiatric/Behavioral: Negative for sleep disturbance.       Objective:   Vitals:   01/11/18 1139  BP: (!) 100/54  Pulse: 76  Resp: 16  Temp: 98 F (36.7 C)  SpO2: 96%   Wt Readings from Last 3 Encounters:  01/11/18 127 lb (57.6 kg)  11/04/17 126 lb 5.2 oz (57.3 kg)  11/01/17 127 lb (57.6 kg)   Body mass index is 24.8 kg/m.   Physical Exam    Constitutional: Appears well-developed and well-nourished. No distress.  HENT:  Head: Normocephalic and atraumatic.  Neck: Neck supple. No tracheal deviation present. No thyromegaly present.  No cervical lymphadenopathy Cardiovascular: Normal rate, regular rhythm and normal heart sounds.   2/6 systolic murmur heard. No carotid bruit .  No edema Pulmonary/Chest: Effort normal and breath sounds normal. No respiratory distress. No has no wheezes. No rales.  Abdomen: Soft,  nondistended, nontender Neurological: 4/5 right upper and lower extremity strength, 5/5 left-sided strength; normal sensation in all extremities Skin: Skin is warm and dry. Not diaphoretic.  Psychiatric: Normal mood and affect. Behavior is normal.      Assessment & Plan:   Hospital bed to be ordered from home health agency.  This is medically necessary.  She had a recent stroke and has right-sided residual weakness and dysphasia.  She has difficulty transferring in and out of the bed and having a hospital bed will aid in her getting in and out of bed and allow her to have her head raised which will help with her dysphagia.  She does spend a lot of time in bed and is at an increased risk of pressure sores and will need to change positions frequently   See Problem List for Assessment and Plan of chronic medical problems.

## 2018-01-11 ENCOUNTER — Ambulatory Visit (INDEPENDENT_AMBULATORY_CARE_PROVIDER_SITE_OTHER): Payer: Medicare Other | Admitting: Internal Medicine

## 2018-01-11 ENCOUNTER — Telehealth: Payer: Self-pay | Admitting: Internal Medicine

## 2018-01-11 ENCOUNTER — Encounter: Payer: Self-pay | Admitting: Internal Medicine

## 2018-01-11 VITALS — BP 100/54 | HR 76 | Temp 98.0°F | Resp 16 | Ht 60.0 in | Wt 127.0 lb

## 2018-01-11 DIAGNOSIS — I10 Essential (primary) hypertension: Secondary | ICD-10-CM

## 2018-01-11 DIAGNOSIS — E1165 Type 2 diabetes mellitus with hyperglycemia: Secondary | ICD-10-CM | POA: Diagnosis not present

## 2018-01-11 DIAGNOSIS — I69351 Hemiplegia and hemiparesis following cerebral infarction affecting right dominant side: Secondary | ICD-10-CM | POA: Diagnosis not present

## 2018-01-11 DIAGNOSIS — E782 Mixed hyperlipidemia: Secondary | ICD-10-CM

## 2018-01-11 DIAGNOSIS — S81001D Unspecified open wound, right knee, subsequent encounter: Secondary | ICD-10-CM | POA: Diagnosis not present

## 2018-01-11 DIAGNOSIS — I6930 Unspecified sequelae of cerebral infarction: Secondary | ICD-10-CM

## 2018-01-11 DIAGNOSIS — I69320 Aphasia following cerebral infarction: Secondary | ICD-10-CM | POA: Diagnosis not present

## 2018-01-11 DIAGNOSIS — E039 Hypothyroidism, unspecified: Secondary | ICD-10-CM | POA: Diagnosis not present

## 2018-01-11 DIAGNOSIS — I2510 Atherosclerotic heart disease of native coronary artery without angina pectoris: Secondary | ICD-10-CM | POA: Diagnosis not present

## 2018-01-11 MED ORDER — LEVOTHYROXINE SODIUM 150 MCG PO TABS: 150.0000 ug | ORAL_TABLET | Freq: Every day | ORAL | 1 refills | Status: DC

## 2018-01-11 MED ORDER — DULOXETINE HCL 60 MG PO CPEP: 60.0000 mg | ORAL_CAPSULE | Freq: Every day | ORAL | 1 refills | Status: DC

## 2018-01-11 MED ORDER — METFORMIN HCL 1000 MG PO TABS: 1000.0000 mg | ORAL_TABLET | Freq: Two times a day (BID) | ORAL | 1 refills | Status: DC

## 2018-01-11 MED ORDER — PANTOPRAZOLE SODIUM 40 MG PO TBEC: 40.0000 mg | DELAYED_RELEASE_TABLET | Freq: Two times a day (BID) | ORAL | 1 refills | Status: DC

## 2018-01-11 MED ORDER — TRAZODONE HCL 50 MG PO TABS: 100.0000 mg | ORAL_TABLET | Freq: Every day | ORAL | 0 refills | Status: DC

## 2018-01-11 MED ORDER — ATORVASTATIN CALCIUM 80 MG PO TABS: 80.0000 mg | ORAL_TABLET | Freq: Every day | ORAL | 1 refills | Status: DC

## 2018-01-11 MED ORDER — SITAGLIPTIN PHOSPHATE 50 MG PO TABS: 50.0000 mg | ORAL_TABLET | Freq: Every day | ORAL | 1 refills | Status: DC

## 2018-01-11 NOTE — Assessment & Plan Note (Signed)
Hospital that is medically necessary as stated in my note-paperwork completed Restart atorvastatin Continue Plavix Blood pressure well controlled and her medication will be adjusted since her blood pressure is on the low side Doing PT, OT and ST Home health nurse in place

## 2018-01-11 NOTE — Patient Instructions (Addendum)
   Medications reviewed and updated.  Changes include stop the amlodipine for now.   Your prescription(s) have been submitted to your pharmacy. Please take as directed and contact our office if you believe you are having problem(s) with the medication(s).   Please followup in 4 months

## 2018-01-11 NOTE — Telephone Encounter (Signed)
Spoke with [name] to give verbal orders per MD

## 2018-01-11 NOTE — Assessment & Plan Note (Signed)
She was not given a prescription for atorvastatin when she left rehab Restart atorvastatin Prescription sent to pharmacy

## 2018-01-11 NOTE — Assessment & Plan Note (Signed)
Blood pressure is on the low side here and it was low with occupational therapy She is high risk for falls Discontinue amlodipine Continue losartan and metoprolol at current doses Blood pressure to be monitored at home by home visiting nurse and PT/OT

## 2018-01-11 NOTE — Assessment & Plan Note (Signed)
Sugars have been well controlled Continue current medication Will recheck A1c at her next visit

## 2018-01-11 NOTE — Assessment & Plan Note (Signed)
Continue current dose of levothyroxine.

## 2018-01-11 NOTE — Telephone Encounter (Signed)
Copied from [place]. Topic: Quick Communication - See Telephone Encounter >> Jan 11, 2018 12:19 PM Ivar Drape wrote: CRM for notification. See Telephone encounter for:  01/11/18. [name] w/[facility] [phone] need an order for speech therapy, twice a week for four weeks for Aphasia.

## 2018-01-12 ENCOUNTER — Telehealth: Payer: Self-pay | Admitting: Internal Medicine

## 2018-01-12 DIAGNOSIS — S81001D Unspecified open wound, right knee, subsequent encounter: Secondary | ICD-10-CM | POA: Diagnosis not present

## 2018-01-12 DIAGNOSIS — I2510 Atherosclerotic heart disease of native coronary artery without angina pectoris: Secondary | ICD-10-CM | POA: Diagnosis not present

## 2018-01-12 DIAGNOSIS — I10 Essential (primary) hypertension: Secondary | ICD-10-CM | POA: Diagnosis not present

## 2018-01-12 DIAGNOSIS — I69351 Hemiplegia and hemiparesis following cerebral infarction affecting right dominant side: Secondary | ICD-10-CM | POA: Diagnosis not present

## 2018-01-12 DIAGNOSIS — E039 Hypothyroidism, unspecified: Secondary | ICD-10-CM | POA: Diagnosis not present

## 2018-01-12 DIAGNOSIS — I69320 Aphasia following cerebral infarction: Secondary | ICD-10-CM | POA: Diagnosis not present

## 2018-01-12 NOTE — Telephone Encounter (Signed)
Per MD ok for verbal notified Elta Guadeloupe w/MD approval../lmb

## 2018-01-12 NOTE — Telephone Encounter (Signed)
Copied from [place] [phone]. Topic: Inquiry >> Jan 12, 2018  2:20 PM Pricilla Handler wrote: Reason for CRM: Izora Gala from [place] at [facility] # [phone]) called requesting verbal orders for OT twice a week for 8 weeks for ADL Training& Strengthening. Please call Izora Gala today at [phone].       Thank You!!!

## 2018-01-12 NOTE — Telephone Encounter (Signed)
Called Izora Gala no answer [facility] Per MD ok for verbal../lm,b

## 2018-01-12 NOTE — Telephone Encounter (Signed)
Copied from [place] #[id number]. Topic: General - Other >> Jan 12, 2018  8:38 AM Oneta Rack wrote: Caller name: Elta Guadeloupe  Relation to pt: PT from [facility]  Call back number: [phone]   Reason for call:   Verbal orders for home health PT 2x 8 starting 01/11/2018

## 2018-01-13 DIAGNOSIS — S81001D Unspecified open wound, right knee, subsequent encounter: Secondary | ICD-10-CM | POA: Diagnosis not present

## 2018-01-13 DIAGNOSIS — I69351 Hemiplegia and hemiparesis following cerebral infarction affecting right dominant side: Secondary | ICD-10-CM | POA: Diagnosis not present

## 2018-01-13 DIAGNOSIS — I69320 Aphasia following cerebral infarction: Secondary | ICD-10-CM | POA: Diagnosis not present

## 2018-01-13 DIAGNOSIS — I2510 Atherosclerotic heart disease of native coronary artery without angina pectoris: Secondary | ICD-10-CM | POA: Diagnosis not present

## 2018-01-13 DIAGNOSIS — I10 Essential (primary) hypertension: Secondary | ICD-10-CM | POA: Diagnosis not present

## 2018-01-13 DIAGNOSIS — E039 Hypothyroidism, unspecified: Secondary | ICD-10-CM | POA: Diagnosis not present

## 2018-01-16 DIAGNOSIS — S81001D Unspecified open wound, right knee, subsequent encounter: Secondary | ICD-10-CM | POA: Diagnosis not present

## 2018-01-16 DIAGNOSIS — I2510 Atherosclerotic heart disease of native coronary artery without angina pectoris: Secondary | ICD-10-CM | POA: Diagnosis not present

## 2018-01-16 DIAGNOSIS — E039 Hypothyroidism, unspecified: Secondary | ICD-10-CM | POA: Diagnosis not present

## 2018-01-16 DIAGNOSIS — I69351 Hemiplegia and hemiparesis following cerebral infarction affecting right dominant side: Secondary | ICD-10-CM | POA: Diagnosis not present

## 2018-01-16 DIAGNOSIS — I10 Essential (primary) hypertension: Secondary | ICD-10-CM | POA: Diagnosis not present

## 2018-01-16 DIAGNOSIS — I69320 Aphasia following cerebral infarction: Secondary | ICD-10-CM | POA: Diagnosis not present

## 2018-01-17 DIAGNOSIS — E039 Hypothyroidism, unspecified: Secondary | ICD-10-CM | POA: Diagnosis not present

## 2018-01-17 DIAGNOSIS — I69320 Aphasia following cerebral infarction: Secondary | ICD-10-CM | POA: Diagnosis not present

## 2018-01-17 DIAGNOSIS — I69351 Hemiplegia and hemiparesis following cerebral infarction affecting right dominant side: Secondary | ICD-10-CM | POA: Diagnosis not present

## 2018-01-17 DIAGNOSIS — I10 Essential (primary) hypertension: Secondary | ICD-10-CM | POA: Diagnosis not present

## 2018-01-17 DIAGNOSIS — I2510 Atherosclerotic heart disease of native coronary artery without angina pectoris: Secondary | ICD-10-CM | POA: Diagnosis not present

## 2018-01-17 DIAGNOSIS — S81001D Unspecified open wound, right knee, subsequent encounter: Secondary | ICD-10-CM | POA: Diagnosis not present

## 2018-01-18 DIAGNOSIS — S81001D Unspecified open wound, right knee, subsequent encounter: Secondary | ICD-10-CM | POA: Diagnosis not present

## 2018-01-18 DIAGNOSIS — I69320 Aphasia following cerebral infarction: Secondary | ICD-10-CM | POA: Diagnosis not present

## 2018-01-18 DIAGNOSIS — E039 Hypothyroidism, unspecified: Secondary | ICD-10-CM | POA: Diagnosis not present

## 2018-01-18 DIAGNOSIS — I2510 Atherosclerotic heart disease of native coronary artery without angina pectoris: Secondary | ICD-10-CM | POA: Diagnosis not present

## 2018-01-18 DIAGNOSIS — I69351 Hemiplegia and hemiparesis following cerebral infarction affecting right dominant side: Secondary | ICD-10-CM | POA: Diagnosis not present

## 2018-01-18 DIAGNOSIS — I10 Essential (primary) hypertension: Secondary | ICD-10-CM | POA: Diagnosis not present

## 2018-01-19 DIAGNOSIS — I69351 Hemiplegia and hemiparesis following cerebral infarction affecting right dominant side: Secondary | ICD-10-CM | POA: Diagnosis not present

## 2018-01-19 DIAGNOSIS — I10 Essential (primary) hypertension: Secondary | ICD-10-CM | POA: Diagnosis not present

## 2018-01-19 DIAGNOSIS — I2510 Atherosclerotic heart disease of native coronary artery without angina pectoris: Secondary | ICD-10-CM | POA: Diagnosis not present

## 2018-01-19 DIAGNOSIS — E039 Hypothyroidism, unspecified: Secondary | ICD-10-CM | POA: Diagnosis not present

## 2018-01-19 DIAGNOSIS — I69320 Aphasia following cerebral infarction: Secondary | ICD-10-CM | POA: Diagnosis not present

## 2018-01-19 DIAGNOSIS — S81001D Unspecified open wound, right knee, subsequent encounter: Secondary | ICD-10-CM | POA: Diagnosis not present

## 2018-01-20 ENCOUNTER — Telehealth: Payer: Self-pay | Admitting: Cardiovascular Disease

## 2018-01-20 ENCOUNTER — Other Ambulatory Visit: Payer: Self-pay | Admitting: Internal Medicine

## 2018-01-20 ENCOUNTER — Telehealth: Payer: Self-pay | Admitting: Internal Medicine

## 2018-01-20 DIAGNOSIS — I10 Essential (primary) hypertension: Secondary | ICD-10-CM

## 2018-01-20 DIAGNOSIS — I2510 Atherosclerotic heart disease of native coronary artery without angina pectoris: Secondary | ICD-10-CM | POA: Diagnosis not present

## 2018-01-20 DIAGNOSIS — I25700 Atherosclerosis of coronary artery bypass graft(s), unspecified, with unstable angina pectoris: Secondary | ICD-10-CM

## 2018-01-20 DIAGNOSIS — E039 Hypothyroidism, unspecified: Secondary | ICD-10-CM | POA: Diagnosis not present

## 2018-01-20 DIAGNOSIS — I69351 Hemiplegia and hemiparesis following cerebral infarction affecting right dominant side: Secondary | ICD-10-CM | POA: Diagnosis not present

## 2018-01-20 DIAGNOSIS — I200 Unstable angina: Secondary | ICD-10-CM

## 2018-01-20 DIAGNOSIS — S81001D Unspecified open wound, right knee, subsequent encounter: Secondary | ICD-10-CM | POA: Diagnosis not present

## 2018-01-20 DIAGNOSIS — I69320 Aphasia following cerebral infarction: Secondary | ICD-10-CM | POA: Diagnosis not present

## 2018-01-20 MED ORDER — CLOPIDOGREL BISULFATE 75 MG PO TABS: 75.0000 mg | ORAL_TABLET | Freq: Every day | ORAL | 0 refills | Status: DC

## 2018-01-20 MED ORDER — METOPROLOL TARTRATE 25 MG PO TABS: 25.0000 mg | ORAL_TABLET | Freq: Two times a day (BID) | ORAL | 0 refills | Status: DC

## 2018-01-20 NOTE — Telephone Encounter (Signed)
Copied from [place]. Topic: Inquiry >> Jan 20, 2018 11:49 AM Corie Chiquito, NT wrote: Reason for CRM: Cassie called from [facility] because she is trying to find out why the patients Metoprolol tartrate 25mg  and Losartan 50mg  was denied a refill. If someone could give the pharmacy a call back about the two medications at [phone]

## 2018-01-20 NOTE — Telephone Encounter (Signed)
New message    Pt c/o medication issue:  1. Name of Medication: unknown  2. How are you currently taking this medication (dosage and times per day)? unknown  3. Are you having a reaction (difficulty breathing--STAT)? no  4. What is your medication issue? Pt 's husband says that he was instructed to schedule an appt with Burt Knack before he can get his wife's prescription, but the next available appt is 3/4 with Bhagat which I schedule Can pt get a prescription before appt?

## 2018-01-20 NOTE — Telephone Encounter (Signed)
Patient is scheduled to see Vin 3/4. Cardiac medications refilled until that time.

## 2018-01-20 NOTE — Telephone Encounter (Signed)
Spoke with Cassie, who is the pharmacist with [facility]  And notified her that the requested medications were not previously prescribed by Dr. Quay Burow and this is why the medications were denied. Medications were previously prescribed by the pt's cardiologist. Pharmacist verbalized understanding and will sent request to cardiology.

## 2018-01-24 DIAGNOSIS — E039 Hypothyroidism, unspecified: Secondary | ICD-10-CM | POA: Diagnosis not present

## 2018-01-24 DIAGNOSIS — I69320 Aphasia following cerebral infarction: Secondary | ICD-10-CM | POA: Diagnosis not present

## 2018-01-24 DIAGNOSIS — S81001D Unspecified open wound, right knee, subsequent encounter: Secondary | ICD-10-CM | POA: Diagnosis not present

## 2018-01-24 DIAGNOSIS — I69351 Hemiplegia and hemiparesis following cerebral infarction affecting right dominant side: Secondary | ICD-10-CM | POA: Diagnosis not present

## 2018-01-24 DIAGNOSIS — I10 Essential (primary) hypertension: Secondary | ICD-10-CM | POA: Diagnosis not present

## 2018-01-24 DIAGNOSIS — I2510 Atherosclerotic heart disease of native coronary artery without angina pectoris: Secondary | ICD-10-CM | POA: Diagnosis not present

## 2018-01-26 ENCOUNTER — Other Ambulatory Visit: Payer: Self-pay

## 2018-01-26 DIAGNOSIS — E039 Hypothyroidism, unspecified: Secondary | ICD-10-CM | POA: Diagnosis not present

## 2018-01-26 DIAGNOSIS — S81001D Unspecified open wound, right knee, subsequent encounter: Secondary | ICD-10-CM | POA: Diagnosis not present

## 2018-01-26 DIAGNOSIS — I69320 Aphasia following cerebral infarction: Secondary | ICD-10-CM | POA: Diagnosis not present

## 2018-01-26 DIAGNOSIS — I10 Essential (primary) hypertension: Secondary | ICD-10-CM

## 2018-01-26 DIAGNOSIS — I2510 Atherosclerotic heart disease of native coronary artery without angina pectoris: Secondary | ICD-10-CM | POA: Diagnosis not present

## 2018-01-26 DIAGNOSIS — I69351 Hemiplegia and hemiparesis following cerebral infarction affecting right dominant side: Secondary | ICD-10-CM | POA: Diagnosis not present

## 2018-01-27 DIAGNOSIS — S81001D Unspecified open wound, right knee, subsequent encounter: Secondary | ICD-10-CM | POA: Diagnosis not present

## 2018-01-27 DIAGNOSIS — Z950 Presence of cardiac pacemaker: Secondary | ICD-10-CM | POA: Diagnosis not present

## 2018-01-27 DIAGNOSIS — E1142 Type 2 diabetes mellitus with diabetic polyneuropathy: Secondary | ICD-10-CM | POA: Diagnosis not present

## 2018-01-27 DIAGNOSIS — I69320 Aphasia following cerebral infarction: Secondary | ICD-10-CM | POA: Diagnosis not present

## 2018-01-27 DIAGNOSIS — Z7902 Long term (current) use of antithrombotics/antiplatelets: Secondary | ICD-10-CM

## 2018-01-27 DIAGNOSIS — R1312 Dysphagia, oropharyngeal phase: Secondary | ICD-10-CM | POA: Diagnosis not present

## 2018-01-27 DIAGNOSIS — S82001D Unspecified fracture of right patella, subsequent encounter for closed fracture with routine healing: Secondary | ICD-10-CM | POA: Diagnosis not present

## 2018-01-27 DIAGNOSIS — I2510 Atherosclerotic heart disease of native coronary artery without angina pectoris: Secondary | ICD-10-CM | POA: Diagnosis not present

## 2018-01-27 DIAGNOSIS — M1991 Primary osteoarthritis, unspecified site: Secondary | ICD-10-CM | POA: Diagnosis not present

## 2018-01-27 DIAGNOSIS — I10 Essential (primary) hypertension: Secondary | ICD-10-CM | POA: Diagnosis not present

## 2018-01-27 DIAGNOSIS — E039 Hypothyroidism, unspecified: Secondary | ICD-10-CM | POA: Diagnosis not present

## 2018-01-27 DIAGNOSIS — Z7982 Long term (current) use of aspirin: Secondary | ICD-10-CM

## 2018-01-27 DIAGNOSIS — Z9181 History of falling: Secondary | ICD-10-CM

## 2018-01-27 DIAGNOSIS — Z7984 Long term (current) use of oral hypoglycemic drugs: Secondary | ICD-10-CM

## 2018-01-27 DIAGNOSIS — I69351 Hemiplegia and hemiparesis following cerebral infarction affecting right dominant side: Secondary | ICD-10-CM | POA: Diagnosis not present

## 2018-01-27 DIAGNOSIS — F0280 Dementia in other diseases classified elsewhere without behavioral disturbance: Secondary | ICD-10-CM | POA: Diagnosis not present

## 2018-01-27 DIAGNOSIS — E1151 Type 2 diabetes mellitus with diabetic peripheral angiopathy without gangrene: Secondary | ICD-10-CM | POA: Diagnosis not present

## 2018-01-30 ENCOUNTER — Other Ambulatory Visit: Payer: Self-pay | Admitting: Internal Medicine

## 2018-01-30 DIAGNOSIS — I10 Essential (primary) hypertension: Secondary | ICD-10-CM

## 2018-01-30 DIAGNOSIS — S81001D Unspecified open wound, right knee, subsequent encounter: Secondary | ICD-10-CM | POA: Diagnosis not present

## 2018-01-30 DIAGNOSIS — E039 Hypothyroidism, unspecified: Secondary | ICD-10-CM | POA: Diagnosis not present

## 2018-01-30 DIAGNOSIS — I69351 Hemiplegia and hemiparesis following cerebral infarction affecting right dominant side: Secondary | ICD-10-CM | POA: Diagnosis not present

## 2018-01-30 DIAGNOSIS — I2510 Atherosclerotic heart disease of native coronary artery without angina pectoris: Secondary | ICD-10-CM | POA: Diagnosis not present

## 2018-01-30 DIAGNOSIS — I69320 Aphasia following cerebral infarction: Secondary | ICD-10-CM | POA: Diagnosis not present

## 2018-01-31 DIAGNOSIS — I2510 Atherosclerotic heart disease of native coronary artery without angina pectoris: Secondary | ICD-10-CM | POA: Diagnosis not present

## 2018-01-31 DIAGNOSIS — S81001D Unspecified open wound, right knee, subsequent encounter: Secondary | ICD-10-CM | POA: Diagnosis not present

## 2018-01-31 DIAGNOSIS — I10 Essential (primary) hypertension: Secondary | ICD-10-CM | POA: Diagnosis not present

## 2018-01-31 DIAGNOSIS — E039 Hypothyroidism, unspecified: Secondary | ICD-10-CM | POA: Diagnosis not present

## 2018-01-31 DIAGNOSIS — I69351 Hemiplegia and hemiparesis following cerebral infarction affecting right dominant side: Secondary | ICD-10-CM | POA: Diagnosis not present

## 2018-01-31 DIAGNOSIS — I69320 Aphasia following cerebral infarction: Secondary | ICD-10-CM | POA: Diagnosis not present

## 2018-02-01 DIAGNOSIS — I69351 Hemiplegia and hemiparesis following cerebral infarction affecting right dominant side: Secondary | ICD-10-CM | POA: Diagnosis not present

## 2018-02-01 DIAGNOSIS — E039 Hypothyroidism, unspecified: Secondary | ICD-10-CM | POA: Diagnosis not present

## 2018-02-01 DIAGNOSIS — I69320 Aphasia following cerebral infarction: Secondary | ICD-10-CM | POA: Diagnosis not present

## 2018-02-01 DIAGNOSIS — S81001D Unspecified open wound, right knee, subsequent encounter: Secondary | ICD-10-CM | POA: Diagnosis not present

## 2018-02-01 DIAGNOSIS — I10 Essential (primary) hypertension: Secondary | ICD-10-CM | POA: Diagnosis not present

## 2018-02-01 DIAGNOSIS — I2510 Atherosclerotic heart disease of native coronary artery without angina pectoris: Secondary | ICD-10-CM | POA: Diagnosis not present

## 2018-02-02 DIAGNOSIS — I69320 Aphasia following cerebral infarction: Secondary | ICD-10-CM | POA: Diagnosis not present

## 2018-02-02 DIAGNOSIS — I2510 Atherosclerotic heart disease of native coronary artery without angina pectoris: Secondary | ICD-10-CM | POA: Diagnosis not present

## 2018-02-02 DIAGNOSIS — I69351 Hemiplegia and hemiparesis following cerebral infarction affecting right dominant side: Secondary | ICD-10-CM | POA: Diagnosis not present

## 2018-02-02 DIAGNOSIS — I10 Essential (primary) hypertension: Secondary | ICD-10-CM | POA: Diagnosis not present

## 2018-02-02 DIAGNOSIS — S81001D Unspecified open wound, right knee, subsequent encounter: Secondary | ICD-10-CM | POA: Diagnosis not present

## 2018-02-02 DIAGNOSIS — E039 Hypothyroidism, unspecified: Secondary | ICD-10-CM | POA: Diagnosis not present

## 2018-02-06 ENCOUNTER — Ambulatory Visit: Payer: Medicare Other | Admitting: Physician Assistant

## 2018-02-07 DIAGNOSIS — S81001D Unspecified open wound, right knee, subsequent encounter: Secondary | ICD-10-CM | POA: Diagnosis not present

## 2018-02-07 DIAGNOSIS — I10 Essential (primary) hypertension: Secondary | ICD-10-CM | POA: Diagnosis not present

## 2018-02-07 DIAGNOSIS — I69320 Aphasia following cerebral infarction: Secondary | ICD-10-CM | POA: Diagnosis not present

## 2018-02-07 DIAGNOSIS — I2510 Atherosclerotic heart disease of native coronary artery without angina pectoris: Secondary | ICD-10-CM | POA: Diagnosis not present

## 2018-02-07 DIAGNOSIS — I69351 Hemiplegia and hemiparesis following cerebral infarction affecting right dominant side: Secondary | ICD-10-CM | POA: Diagnosis not present

## 2018-02-07 DIAGNOSIS — E039 Hypothyroidism, unspecified: Secondary | ICD-10-CM | POA: Diagnosis not present

## 2018-02-09 DIAGNOSIS — E039 Hypothyroidism, unspecified: Secondary | ICD-10-CM | POA: Diagnosis not present

## 2018-02-09 DIAGNOSIS — I2510 Atherosclerotic heart disease of native coronary artery without angina pectoris: Secondary | ICD-10-CM | POA: Diagnosis not present

## 2018-02-09 DIAGNOSIS — I10 Essential (primary) hypertension: Secondary | ICD-10-CM | POA: Diagnosis not present

## 2018-02-09 DIAGNOSIS — I69351 Hemiplegia and hemiparesis following cerebral infarction affecting right dominant side: Secondary | ICD-10-CM | POA: Diagnosis not present

## 2018-02-09 DIAGNOSIS — I69320 Aphasia following cerebral infarction: Secondary | ICD-10-CM | POA: Diagnosis not present

## 2018-02-09 DIAGNOSIS — S81001D Unspecified open wound, right knee, subsequent encounter: Secondary | ICD-10-CM | POA: Diagnosis not present

## 2018-02-10 DIAGNOSIS — I69320 Aphasia following cerebral infarction: Secondary | ICD-10-CM | POA: Diagnosis not present

## 2018-02-10 DIAGNOSIS — S81001D Unspecified open wound, right knee, subsequent encounter: Secondary | ICD-10-CM | POA: Diagnosis not present

## 2018-02-10 DIAGNOSIS — I10 Essential (primary) hypertension: Secondary | ICD-10-CM | POA: Diagnosis not present

## 2018-02-10 DIAGNOSIS — I69351 Hemiplegia and hemiparesis following cerebral infarction affecting right dominant side: Secondary | ICD-10-CM | POA: Diagnosis not present

## 2018-02-10 DIAGNOSIS — E039 Hypothyroidism, unspecified: Secondary | ICD-10-CM | POA: Diagnosis not present

## 2018-02-10 DIAGNOSIS — I2510 Atherosclerotic heart disease of native coronary artery without angina pectoris: Secondary | ICD-10-CM | POA: Diagnosis not present

## 2018-02-14 DIAGNOSIS — I69351 Hemiplegia and hemiparesis following cerebral infarction affecting right dominant side: Secondary | ICD-10-CM | POA: Diagnosis not present

## 2018-02-14 DIAGNOSIS — I10 Essential (primary) hypertension: Secondary | ICD-10-CM | POA: Diagnosis not present

## 2018-02-14 DIAGNOSIS — E039 Hypothyroidism, unspecified: Secondary | ICD-10-CM | POA: Diagnosis not present

## 2018-02-14 DIAGNOSIS — I69320 Aphasia following cerebral infarction: Secondary | ICD-10-CM | POA: Diagnosis not present

## 2018-02-14 DIAGNOSIS — S81001D Unspecified open wound, right knee, subsequent encounter: Secondary | ICD-10-CM | POA: Diagnosis not present

## 2018-02-14 DIAGNOSIS — I2510 Atherosclerotic heart disease of native coronary artery without angina pectoris: Secondary | ICD-10-CM | POA: Diagnosis not present

## 2018-02-16 DIAGNOSIS — I2510 Atherosclerotic heart disease of native coronary artery without angina pectoris: Secondary | ICD-10-CM | POA: Diagnosis not present

## 2018-02-16 DIAGNOSIS — I10 Essential (primary) hypertension: Secondary | ICD-10-CM | POA: Diagnosis not present

## 2018-02-16 DIAGNOSIS — E039 Hypothyroidism, unspecified: Secondary | ICD-10-CM | POA: Diagnosis not present

## 2018-02-16 DIAGNOSIS — I69320 Aphasia following cerebral infarction: Secondary | ICD-10-CM | POA: Diagnosis not present

## 2018-02-16 DIAGNOSIS — S81001D Unspecified open wound, right knee, subsequent encounter: Secondary | ICD-10-CM | POA: Diagnosis not present

## 2018-02-16 DIAGNOSIS — I69351 Hemiplegia and hemiparesis following cerebral infarction affecting right dominant side: Secondary | ICD-10-CM | POA: Diagnosis not present

## 2018-02-19 ENCOUNTER — Other Ambulatory Visit: Payer: Self-pay | Admitting: Cardiovascular Disease

## 2018-02-19 DIAGNOSIS — I25700 Atherosclerosis of coronary artery bypass graft(s), unspecified, with unstable angina pectoris: Secondary | ICD-10-CM

## 2018-02-19 DIAGNOSIS — I200 Unstable angina: Secondary | ICD-10-CM

## 2018-02-19 NOTE — Progress Notes (Signed)
Subjective:    Patient ID: Janet Steele, female    DOB: 10-20-1929, 84 y.o.   MRN: [id number]  HPI The patient is here for an acute visit.  Her husband is in the lobby and stated that she has stomach issues for one week.  She denies any stomach issues.  She is a very poor historian and history most likely is not accurate.  She does have mild-moderate dementia.  Frequent urination:  She urinates 4 times an hour.  She is not on any medication now.  She has been on several medications for overactive bladder in the past and they were not very effective.  Since having her stroke she is not able to ambulate independently and is not able to make it to the bathroom.  She is wearing a diaper 24/7.  She states she urinates 4 times an hour and wants to know what can be done about this.  Diabetes: She states she is taking her medication.  She states she is eating well.  She is not able to exercise.  Hypertension: She is compliant with a low-sodium diet.  She states she is taking her medication.  She denies any chest pain, leg swelling, palpitations or shortness of breath.  Hypothyroidism: She states she is taking her medication.  She denies any acute changes in energy level.  She has lost some weight.  She states she is eating normally.  History of CVA with right-sided weakness, hyperlipidemia: She still has weakness in her right leg and is not able to ambulate independently.  She uses a walker and still needs assistance.  She gets frustrated by her inability to express herself at times.  She is not followed up with neurology since her stroke.  She is taking her Plavix, statin and baby aspirin daily.   Medications and allergies reviewed with patient and updated if appropriate.  Patient Active Problem List   Diagnosis Date Noted  . History of CVA (cerebrovascular accident) 01/07/2018  . Stroke ([place]) 11/04/2017  . Weakness 11/03/2017  . History of cerebrovascular accident (CVA) with residual  deficit 11/03/2017  . Chronic back pain 07/04/2017  . Rib pain on left side 04/20/2017  . PAD (peripheral artery disease) ([place]) 01/17/2017  . Overactive bladder 12/17/2016  . Anemia 05/20/2016  . Coronary artery disease involving coronary bypass graft of native heart without angina pectoris 05/19/2016  . Numbness and tingling of foot 12/31/2015  . MCI (mild cognitive impairment) with memory loss 11/18/2015  . Facial tic 05/07/2015  . Facial nerve spasm 05/07/2015  . Sick sinus syndrome ([place]) 02/26/2015  . Cervical dystonia 12/04/2014  . Chronic motor tic 09/12/2014  . Nonspecific abnormal electrocardiogram (ECG) (EKG)   . Unstable angina ([place]) 03/02/2014  . Pacemaker-Medtronic 07/07/2012  . Bradycardia 07/05/2012  . GERD (gastroesophageal reflux disease) 07/05/2012  . Near syncope 12/23/2011  . Abnormal involuntary movement 12/17/2009  . Disturbance in sleep behavior 12/11/2009  . CAD, ARTERY BYPASS GRAFT 09/24/2009  . CAROTID BRUIT 09/24/2009  . Diabetes ([place]) 01/30/2009  . DYSPHAGIA 06/19/2008  . Anxiety 02/14/2008  . EROSIVE ESOPHAGITIS 02/14/2008  . CONSTIPATION, CHRONIC 02/14/2008  . DEGENERATIVE JOINT DISEASE 02/14/2008  . HEARING LOSS 01/04/2008  . PVD 01/04/2008  . Hypothyroidism 09/25/2007  . HYPERLIPIDEMIA 09/25/2007  . Essential hypertension 09/07/2007  . Diabetic polyneuropathy ([place]) 03/16/2007  . Stricture and stenosis of esophagus 03/16/2007  . HIATAL HERNIA 02/16/2007  . HEMORRHOIDS 12/31/1997  . DIVERTICULOSIS, COLON 12/31/1997    Current Outpatient Medications  on File Prior to Visit  Medication Sig Dispense Refill  . acetaminophen (TYLENOL) 500 MG tablet Take 2 tablets (1,000 mg total) by mouth every 6 (six) hours as needed. 30 tablet 0  . atorvastatin (LIPITOR) 80 MG tablet Take 1 tablet (80 mg total) by mouth daily. 90 tablet 1  . clopidogrel (PLAVIX) 75 MG tablet Take 1 tablet (75 mg total) by mouth daily. 30 tablet 0  . DULoxetine (CYMBALTA) 60 MG  capsule Take 1 capsule (60 mg total) by mouth daily. 90 capsule 1  . levothyroxine (SYNTHROID, LEVOTHROID) 150 MCG tablet Take 1 tablet (150 mcg total) by mouth daily. 90 tablet 1  . losartan (COZAAR) 50 MG tablet take 1 tablet by mouth once daily 90 tablet 1  . metFORMIN (GLUCOPHAGE) 1000 MG tablet Take 1 tablet (1,000 mg total) by mouth 2 (two) times daily with a meal. 180 tablet 1  . metoprolol tartrate (LOPRESSOR) 25 MG tablet Take 1 tablet (25 mg total) by mouth 2 (two) times daily. 60 tablet 0  . mupirocin ointment (BACTROBAN) 2 %     . nitroGLYCERIN (NITROSTAT) 0.4 MG SL tablet Place 1 tablet (0.4 mg total) under the tongue every 5 (five) minutes as needed. For chest pain 25 tablet 5  . pantoprazole (PROTONIX) 40 MG tablet Take 1 tablet (40 mg total) by mouth 2 (two) times daily. 180 tablet 1  . RA ASPIRIN EC 81 MG EC tablet take 1 tablet by mouth once daily . OVERDUE FOR FOLLOW UP. PLEASE CALL AND SCHEDULE [phone]. 15 tablet 0  . sitaGLIPtin (JANUVIA) 50 MG tablet Take 1 tablet (50 mg total) by mouth daily. 90 tablet 1  . traZODone (DESYREL) 50 MG tablet Take 2 tablets (100 mg total) by mouth at bedtime. 180 tablet 0   No current facility-administered medications on file prior to visit.     Past Medical History:  Diagnosis Date  . Anemia   . Anxiety   . Arthritis    "fingers" (05/19/2016  . CAD (coronary artery disease)    a. s/p CABG 1982 b. redo CABG 2003. c. [place] despite med rx 05/2016: successful but complicated PCI of PDA beyond graft site, c/b localized dissection requiring overlapping stent.  . Carotid bruit    a. Duplex 01/2015: patent vessels, 1-39% BICA, f/u PRN recommened.  . Colon polyp    adenomatous  . Colon, diverticulosis   . Degenerative joint disease   . Diastolic dysfunction   . Esophageal stricture   . GERD (gastroesophageal reflux disease)   . Hiatal hernia   . History of blood transfusion    "related to my bypass"  . Hyperlipidemia   . Hypertension     . Hypothyroidism   . Ischemic cardiomyopathy    a. Cath 05/2016: EF 40% with severe inferior wall HK, suspect hibernating myocardium.  . Myocardial infarction ([place]) 1977; 1982  . Osteoarthrosis, unspecified whether generalized or localized, unspecified site   . Presence of permanent cardiac pacemaker   . PVD (peripheral vascular disease) ([place])   . Type II diabetes mellitus ([place])   . Unspecified hearing loss     Past Surgical History:  Procedure Laterality Date  . ABDOMINAL HYSTERECTOMY    . APPENDECTOMY    . BACK SURGERY    . CARDIAC CATHETERIZATION N/A 05/19/2016   Procedure: Left Heart Cath and Cors/Grafts Angiography;  Surgeon: Belva Crome, MD;  Location: [place] [facility];  Service: Cardiovascular;  Laterality: N/A;  . CARDIAC CATHETERIZATION N/A 05/19/2016  Procedure: Coronary Stent Intervention;  Surgeon: Belva Crome, MD;  Location: [place] [facility];  Service: Cardiovascular;  Laterality: N/A;  . CATARACT EXTRACTION W/ INTRAOCULAR LENS  IMPLANT, BILATERAL Bilateral   . CHOLECYSTECTOMY OPEN    . COLONOSCOPY    . CORONARY ANGIOPLASTY WITH STENT PLACEMENT  05/19/2016   "2 stents?"  . CORONARY ARTERY BYPASS GRAFT  1982; 2003   x4(1982),02-2002 CABG X2  . ESOPHAGOGASTRODUODENOSCOPY (EGD) WITH ESOPHAGEAL DILATION  "2-3 times"  . INSERT / REPLACE / REMOVE PACEMAKER    . LEFT HEART CATHETERIZATION WITH CORONARY/GRAFT ANGIOGRAM N/A 03/05/2014   Procedure: LEFT HEART CATHETERIZATION WITH Beatrix Fetters;  Surgeon: Sinclair Grooms, MD;  Location: [facility];  Service: Cardiovascular;  Laterality: N/A;  . LUMBAR [place] SURGERY  01-2000  . OOPHORECTOMY Bilateral   . PERMANENT PACEMAKER INSERTION N/A 07/06/2012   MDT Adapta L implanted by Dr Rayann Heman for symptomatic bradycardia  . TONSILLECTOMY  1930s    Social History   Socioeconomic History  . Marital status: Married    Spouse name: [name]  . Number of children: 3  . Years of education: 54  . Highest education level:  None  Social Needs  . Financial resource strain: None  . Food insecurity - worry: None  . Food insecurity - inability: None  . Transportation needs - medical: None  . Transportation needs - non-medical: None  Occupational History  . Occupation: Retired     Fish farm manager: RETIRED  Tobacco Use  . Smoking status: Former Smoker    Packs/day: 0.50    Years: 30.00    Pack years: 15.00    Types: Cigarettes    Last attempt to quit: 07/05/1976    Years since quitting: 41.6  . Smokeless tobacco: Never Used  Substance and Sexual Activity  . Alcohol use: No    Alcohol/week: 1.8 oz    Types: 3 Glasses of wine per week    Frequency: Never    Comment: "not anymore, I haven't in I don't know years"  . Drug use: No  . Sexual activity: No  Other Topics Concern  . None  Social History Narrative   Patient is married [name]) and lives at home with her husband.   Patient has three adult children.   Patient is retired.   Patient has a college education.   Patient is right-handed.   Patient does not drink any caffeine.    Family History  Problem Relation Age of Onset  . Heart disease Sister   . Heart attack Sister   . Colon cancer Neg Hx   . Breast cancer Neg Hx   . Celiac disease Neg Hx   . Cirrhosis Neg Hx   . Clotting disorder Neg Hx   . Colitis Neg Hx   . Colon polyps Neg Hx   . Crohn's disease Neg Hx   . Cystic fibrosis Neg Hx   . Diabetes Neg Hx   . Esophageal cancer Neg Hx   . Hemochromatosis Neg Hx   . Inflammatory bowel disease Neg Hx   . Irritable bowel syndrome Neg Hx   . Kidney disease Neg Hx   . Liver cancer Neg Hx   . Liver disease Neg Hx   . Ovarian cancer Neg Hx   . Pancreatic cancer Neg Hx   . Prostate cancer Neg Hx   . Rectal cancer Neg Hx   . Stomach cancer Neg Hx   . Ulcerative colitis Neg Hx   . Uterine  cancer Neg Hx   . Wilson's disease Neg Hx     Review of Systems  Constitutional: Negative for appetite change and fever.  Respiratory: Positive for  cough. Negative for shortness of breath and wheezing.   Cardiovascular: Negative for chest pain, palpitations and leg swelling.  Gastrointestinal: Negative for abdominal pain, constipation, diarrhea, nausea and vomiting.  Neurological: Positive for headaches. Negative for light-headedness.       Objective:   Vitals:   02/20/18 1540  BP: 120/74  Pulse: 73  Resp: 16  Temp: 98.4 F (36.9 C)  SpO2: 93%   Wt Readings from Last 3 Encounters:  02/20/18 112 lb (50.8 kg)  01/11/18 127 lb (57.6 kg)  11/04/17 126 lb 5.2 oz (57.3 kg)   Body mass index is 21.87 kg/m.   Physical Exam  Constitutional: She appears well-developed and well-nourished. No distress.  HENT:  Head: Normocephalic and atraumatic.  Eyes: Conjunctivae are normal.  Neck: Neck supple. No tracheal deviation present. No thyromegaly present.  Cardiovascular: Normal rate, regular rhythm and normal heart sounds.  Pulmonary/Chest: Effort normal. No respiratory distress. She has no wheezes. She has no rales.  Abdominal: Soft. She exhibits no distension. There is no tenderness.  Musculoskeletal: She exhibits no edema.  Lymphadenopathy:    She has no cervical adenopathy.  Neurological:  Dysarthria, mild, 4/5 weakness right upper extremity and right lower extremity; normal strength left upper and lower extremities  Skin: Skin is warm and dry. She is not diaphoretic.           Assessment & Plan:    See Problem List for Assessment and Plan of chronic medical problems.

## 2018-02-20 ENCOUNTER — Encounter: Payer: Self-pay | Admitting: Internal Medicine

## 2018-02-20 ENCOUNTER — Other Ambulatory Visit (INDEPENDENT_AMBULATORY_CARE_PROVIDER_SITE_OTHER): Payer: Medicare Other

## 2018-02-20 ENCOUNTER — Ambulatory Visit (INDEPENDENT_AMBULATORY_CARE_PROVIDER_SITE_OTHER): Payer: Medicare Other | Admitting: Internal Medicine

## 2018-02-20 VITALS — BP 120/74 | HR 73 | Temp 98.4°F | Resp 16 | Wt 112.0 lb

## 2018-02-20 DIAGNOSIS — I10 Essential (primary) hypertension: Secondary | ICD-10-CM | POA: Diagnosis not present

## 2018-02-20 DIAGNOSIS — E1165 Type 2 diabetes mellitus with hyperglycemia: Secondary | ICD-10-CM | POA: Diagnosis not present

## 2018-02-20 DIAGNOSIS — E782 Mixed hyperlipidemia: Secondary | ICD-10-CM | POA: Diagnosis not present

## 2018-02-20 DIAGNOSIS — E039 Hypothyroidism, unspecified: Secondary | ICD-10-CM

## 2018-02-20 DIAGNOSIS — I200 Unstable angina: Secondary | ICD-10-CM

## 2018-02-20 DIAGNOSIS — I6930 Unspecified sequelae of cerebral infarction: Secondary | ICD-10-CM | POA: Diagnosis not present

## 2018-02-20 DIAGNOSIS — N3281 Overactive bladder: Secondary | ICD-10-CM

## 2018-02-20 LAB — LIPID PANEL
Cholesterol: 165 mg/dL (ref 0–200)
HDL: 31.7 mg/dL — AB (ref >39.00)
LDL Cholesterol: 104 mg/dL — ABNORMAL HIGH (ref 0–99)
NONHDL: 133.72
Total CHOL/HDL Ratio: 5
Triglycerides: 150 mg/dL — ABNORMAL HIGH (ref 0.0–149.0)
VLDL: 30 mg/dL (ref 0.0–40.0)

## 2018-02-20 LAB — COMPREHENSIVE METABOLIC PANEL
ALT: 7 U/L (ref 0–35)
AST: 12 U/L (ref 0–37)
Albumin: 3.7 g/dL (ref 3.5–5.2)
Alkaline Phosphatase: 92 U/L (ref 39–117)
BUN: 14 mg/dL (ref 6–23)
CO2: 29 meq/L (ref 19–32)
Calcium: 9.5 mg/dL (ref 8.4–10.5)
Chloride: 101 mEq/L (ref 96–112)
Creatinine, Ser: 0.86 mg/dL (ref 0.40–1.20)
GFR: 66.44 mL/min (ref >60.00)
GLUCOSE: 100 mg/dL — AB (ref 70–99)
POTASSIUM: 4.2 meq/L (ref 3.5–5.1)
SODIUM: 140 meq/L (ref 135–145)
Total Bilirubin: 0.7 mg/dL (ref 0.2–1.2)
Total Protein: 7 g/dL (ref 6.0–8.3)

## 2018-02-20 LAB — CBC WITH DIFFERENTIAL/PLATELET
BASOS ABS: 0 10*3/uL (ref 0.0–0.1)
Basophils Relative: 0.5 % (ref 0.0–3.0)
Eosinophils Absolute: 0.2 10*3/uL (ref 0.0–0.7)
Eosinophils Relative: 2.3 % (ref 0.0–5.0)
HCT: 36 % (ref 36.0–46.0)
Hemoglobin: 12.3 g/dL (ref 12.0–15.0)
Lymphocytes Relative: 18.9 % (ref 12.0–46.0)
Lymphs Abs: 1.7 10*3/uL (ref 0.7–4.0)
MCHC: 34.2 g/dL (ref 30.0–36.0)
MCV: 78.2 fl (ref 78.0–100.0)
MONO ABS: 0.7 10*3/uL (ref 0.1–1.0)
MONOS PCT: 7.6 % (ref 3.0–12.0)
NEUTROS ABS: 6.3 10*3/uL (ref 1.4–7.7)
Neutrophils Relative %: 70.7 % (ref 43.0–77.0)
PLATELETS: 410 10*3/uL — AB (ref 150.0–400.0)
RBC: 4.61 Mil/uL (ref 3.87–5.11)
RDW: 14.2 % (ref 11.5–15.5)
WBC: 8.9 10*3/uL (ref 4.0–10.5)

## 2018-02-20 LAB — HEMOGLOBIN A1C: HEMOGLOBIN A1C: 6.7 % — AB (ref 4.6–6.5)

## 2018-02-20 LAB — TSH: TSH: 0.06 u[IU]/mL — ABNORMAL LOW (ref 0.35–4.50)

## 2018-02-20 NOTE — Assessment & Plan Note (Signed)
Check lipid panel.  Continue statin. 

## 2018-02-20 NOTE — Patient Instructions (Addendum)
Follow up with neurology.    Test(s) ordered today. Your results will be released to [facility] (or called to you) after review, usually within 72hours after test completion. If any changes need to be made, you will be notified at that same time.  Medications reviewed and updated.  No changes recommended at this time.   Please followup in May as scheduled

## 2018-02-20 NOTE — Assessment & Plan Note (Signed)
She has been on many medications in the past and most have not been effective She is currently not able to ambulate to the bathroom independently she has not been on any medications for a while Continue use of diaper-it is not worth being on the medications and she will not be able to make it to the bathroom in time

## 2018-02-20 NOTE — Assessment & Plan Note (Signed)
States compliance with a diabetic diet and taking her medication Will check A1c

## 2018-02-20 NOTE — Assessment & Plan Note (Signed)
Residual right-sided weakness and mild expressive aphasia Continue aspirin, Plavix and statin Advised to follow-up with neurology

## 2018-02-20 NOTE — Assessment & Plan Note (Signed)
BP Readings from Last 3 Encounters:  02/20/18 120/74  01/11/18 (!) 100/54  11/08/17 (!) 143/64    BP well controlled Current regimen effective and well tolerated Continue current medications at current doses cmp

## 2018-02-20 NOTE — Assessment & Plan Note (Signed)
There has been some weight loss-we will check TSH and adjust if needed Unsure of her oral intake

## 2018-02-22 ENCOUNTER — Other Ambulatory Visit: Payer: Self-pay | Admitting: Emergency Medicine

## 2018-02-22 DIAGNOSIS — E039 Hypothyroidism, unspecified: Secondary | ICD-10-CM | POA: Diagnosis not present

## 2018-02-22 DIAGNOSIS — I69320 Aphasia following cerebral infarction: Secondary | ICD-10-CM | POA: Diagnosis not present

## 2018-02-22 DIAGNOSIS — I10 Essential (primary) hypertension: Secondary | ICD-10-CM | POA: Diagnosis not present

## 2018-02-22 DIAGNOSIS — S81001D Unspecified open wound, right knee, subsequent encounter: Secondary | ICD-10-CM | POA: Diagnosis not present

## 2018-02-22 DIAGNOSIS — I2510 Atherosclerotic heart disease of native coronary artery without angina pectoris: Secondary | ICD-10-CM | POA: Diagnosis not present

## 2018-02-22 DIAGNOSIS — I69351 Hemiplegia and hemiparesis following cerebral infarction affecting right dominant side: Secondary | ICD-10-CM | POA: Diagnosis not present

## 2018-02-22 MED ORDER — LEVOTHYROXINE SODIUM 137 MCG PO TABS: 137.0000 ug | ORAL_TABLET | Freq: Every day | ORAL | 0 refills | Status: DC

## 2018-02-27 DIAGNOSIS — I69351 Hemiplegia and hemiparesis following cerebral infarction affecting right dominant side: Secondary | ICD-10-CM | POA: Diagnosis not present

## 2018-02-27 DIAGNOSIS — I69320 Aphasia following cerebral infarction: Secondary | ICD-10-CM | POA: Diagnosis not present

## 2018-02-27 DIAGNOSIS — I10 Essential (primary) hypertension: Secondary | ICD-10-CM | POA: Diagnosis not present

## 2018-02-27 DIAGNOSIS — I2510 Atherosclerotic heart disease of native coronary artery without angina pectoris: Secondary | ICD-10-CM | POA: Diagnosis not present

## 2018-02-27 DIAGNOSIS — E039 Hypothyroidism, unspecified: Secondary | ICD-10-CM | POA: Diagnosis not present

## 2018-02-27 DIAGNOSIS — S81001D Unspecified open wound, right knee, subsequent encounter: Secondary | ICD-10-CM | POA: Diagnosis not present

## 2018-02-28 ENCOUNTER — Ambulatory Visit: Payer: Medicare Other | Admitting: Physician Assistant

## 2018-03-02 ENCOUNTER — Telehealth: Payer: Self-pay | Admitting: Internal Medicine

## 2018-03-02 NOTE — Telephone Encounter (Signed)
Copied from [place] [phone]. Topic: Quick Communication - See Telephone Encounter >> Mar 02, 2018 11:28 AM Cleaster Corin, NT wrote: CRM for notification. See Telephone encounter for: 03/02/18.  Pt. Calling back wanting to speak with Dr. Quay Burow or nurse about knee pain. Tried to schedule appt. For pt. But she refused appt.pt. Would like for someone to give her a callback

## 2018-03-02 NOTE — Telephone Encounter (Signed)
Patient called back stating that she is having right leg pain and not knee pain. She believes the pain is the results of a recent stroke. Please call back her back to discuss. Patient does not wish to speak with [place].

## 2018-03-02 NOTE — Telephone Encounter (Signed)
Left message on home phone answering machine (the mobile number is not correct for this patient)---asking patient to call back with her questions about knee pain

## 2018-03-03 ENCOUNTER — Telehealth: Payer: Self-pay | Admitting: Internal Medicine

## 2018-03-03 DIAGNOSIS — E039 Hypothyroidism, unspecified: Secondary | ICD-10-CM | POA: Diagnosis not present

## 2018-03-03 DIAGNOSIS — I2510 Atherosclerotic heart disease of native coronary artery without angina pectoris: Secondary | ICD-10-CM | POA: Diagnosis not present

## 2018-03-03 DIAGNOSIS — I69320 Aphasia following cerebral infarction: Secondary | ICD-10-CM | POA: Diagnosis not present

## 2018-03-03 DIAGNOSIS — I10 Essential (primary) hypertension: Secondary | ICD-10-CM | POA: Diagnosis not present

## 2018-03-03 DIAGNOSIS — S81001D Unspecified open wound, right knee, subsequent encounter: Secondary | ICD-10-CM | POA: Diagnosis not present

## 2018-03-03 DIAGNOSIS — I69351 Hemiplegia and hemiparesis following cerebral infarction affecting right dominant side: Secondary | ICD-10-CM | POA: Diagnosis not present

## 2018-03-03 NOTE — Telephone Encounter (Signed)
i've reviewed office notes patient has had with dr burns since dr burns is not in this week---I have asked patient's physical therapist, Marolyn Hammock, to call me after she sees patient today to get insight on whether this leg pain is physical therapy related or if she feels patient needs to be seen--dr burns will return on Monday, April 1st and will be advised if anything else needs to be addressed or if patient needs office visit---I will call patient after I get more information about what might be cause for this pain

## 2018-03-03 NOTE — Telephone Encounter (Signed)
LVM giving verbal orders per MD

## 2018-03-03 NOTE — Telephone Encounter (Signed)
Copied from [place] [phone]. Topic: Inquiry >> Mar 03, 2018  3:25 PM Oliver Pila B wrote: Reason for CRM: verbal orders for OT next to extend services or to discharge b/c pt declined visit this week, contact [facility] @ home ask for [place] [phone]

## 2018-03-03 NOTE — Telephone Encounter (Signed)
I have talked with katelyn,PT who has had a session with patient today at noon---patient states she did not call in about leg or knee hurting, she has not called our office at all, sitter states she is unaware of any problems with pain--PT states patient is moving legs very well and does not complain with knee pain---PT also states patient has moments of confusion and short term memory loss, but doesn't appear to be in any discomfort or pain---forwarding to dr burns, fyi...Marland KitchenMarland Kitchen

## 2018-03-03 NOTE — Telephone Encounter (Signed)
Patient has memory loss.  Pain may be from previous stroke or arthritis.  Will evaluate if patient calls back regarding this.

## 2018-03-06 ENCOUNTER — Telehealth: Payer: Self-pay | Admitting: Internal Medicine

## 2018-03-06 NOTE — Telephone Encounter (Signed)
Copied from [place] [phone]. Topic: Inquiry >> Mar 06, 2018  8:41 AM Scherrie Gerlach wrote: Reason for CRM: Caityn with [facility] calling for verbal orders for home health PT 2 wk / 9

## 2018-03-06 NOTE — Telephone Encounter (Signed)
Spoke with Urban Gibson to give verbal orders for PT per MD.

## 2018-03-07 DIAGNOSIS — S81001D Unspecified open wound, right knee, subsequent encounter: Secondary | ICD-10-CM | POA: Diagnosis not present

## 2018-03-07 DIAGNOSIS — E039 Hypothyroidism, unspecified: Secondary | ICD-10-CM | POA: Diagnosis not present

## 2018-03-07 DIAGNOSIS — I2510 Atherosclerotic heart disease of native coronary artery without angina pectoris: Secondary | ICD-10-CM | POA: Diagnosis not present

## 2018-03-07 DIAGNOSIS — I10 Essential (primary) hypertension: Secondary | ICD-10-CM | POA: Diagnosis not present

## 2018-03-07 DIAGNOSIS — I69351 Hemiplegia and hemiparesis following cerebral infarction affecting right dominant side: Secondary | ICD-10-CM | POA: Diagnosis not present

## 2018-03-07 DIAGNOSIS — I69320 Aphasia following cerebral infarction: Secondary | ICD-10-CM | POA: Diagnosis not present

## 2018-03-08 DIAGNOSIS — I69351 Hemiplegia and hemiparesis following cerebral infarction affecting right dominant side: Secondary | ICD-10-CM | POA: Diagnosis not present

## 2018-03-08 DIAGNOSIS — I69320 Aphasia following cerebral infarction: Secondary | ICD-10-CM | POA: Diagnosis not present

## 2018-03-08 DIAGNOSIS — E1142 Type 2 diabetes mellitus with diabetic polyneuropathy: Secondary | ICD-10-CM | POA: Diagnosis not present

## 2018-03-08 DIAGNOSIS — I10 Essential (primary) hypertension: Secondary | ICD-10-CM | POA: Diagnosis not present

## 2018-03-08 DIAGNOSIS — E039 Hypothyroidism, unspecified: Secondary | ICD-10-CM | POA: Diagnosis not present

## 2018-03-08 DIAGNOSIS — I2510 Atherosclerotic heart disease of native coronary artery without angina pectoris: Secondary | ICD-10-CM | POA: Diagnosis not present

## 2018-03-09 DIAGNOSIS — I2510 Atherosclerotic heart disease of native coronary artery without angina pectoris: Secondary | ICD-10-CM | POA: Diagnosis not present

## 2018-03-09 DIAGNOSIS — I10 Essential (primary) hypertension: Secondary | ICD-10-CM | POA: Diagnosis not present

## 2018-03-09 DIAGNOSIS — I69351 Hemiplegia and hemiparesis following cerebral infarction affecting right dominant side: Secondary | ICD-10-CM | POA: Diagnosis not present

## 2018-03-09 DIAGNOSIS — E039 Hypothyroidism, unspecified: Secondary | ICD-10-CM | POA: Diagnosis not present

## 2018-03-09 DIAGNOSIS — E1142 Type 2 diabetes mellitus with diabetic polyneuropathy: Secondary | ICD-10-CM | POA: Diagnosis not present

## 2018-03-09 DIAGNOSIS — I69320 Aphasia following cerebral infarction: Secondary | ICD-10-CM | POA: Diagnosis not present

## 2018-03-14 ENCOUNTER — Telehealth: Payer: Self-pay | Admitting: Emergency Medicine

## 2018-03-14 DIAGNOSIS — I69320 Aphasia following cerebral infarction: Secondary | ICD-10-CM | POA: Diagnosis not present

## 2018-03-14 DIAGNOSIS — M21379 Foot drop, unspecified foot: Secondary | ICD-10-CM

## 2018-03-14 DIAGNOSIS — I69351 Hemiplegia and hemiparesis following cerebral infarction affecting right dominant side: Secondary | ICD-10-CM | POA: Diagnosis not present

## 2018-03-14 DIAGNOSIS — I10 Essential (primary) hypertension: Secondary | ICD-10-CM | POA: Diagnosis not present

## 2018-03-14 DIAGNOSIS — I2510 Atherosclerotic heart disease of native coronary artery without angina pectoris: Secondary | ICD-10-CM | POA: Diagnosis not present

## 2018-03-14 DIAGNOSIS — E1142 Type 2 diabetes mellitus with diabetic polyneuropathy: Secondary | ICD-10-CM | POA: Diagnosis not present

## 2018-03-14 DIAGNOSIS — E039 Hypothyroidism, unspecified: Secondary | ICD-10-CM | POA: Diagnosis not present

## 2018-03-14 NOTE — Telephone Encounter (Signed)
Spoke with [place], PT from [facility]. States that pt will need a referral to Ortho for consultation for ankle foot orthosis (AFO). Pt is unable to use store bought one bc her foot is too small. Please note that pts husband or son will need to be called to set up appt with ortho.

## 2018-03-14 NOTE — Telephone Encounter (Signed)
ordered

## 2018-03-16 DIAGNOSIS — D649 Anemia, unspecified: Secondary | ICD-10-CM

## 2018-03-16 DIAGNOSIS — Z7902 Long term (current) use of antithrombotics/antiplatelets: Secondary | ICD-10-CM

## 2018-03-16 DIAGNOSIS — G8929 Other chronic pain: Secondary | ICD-10-CM

## 2018-03-16 DIAGNOSIS — R1312 Dysphagia, oropharyngeal phase: Secondary | ICD-10-CM | POA: Diagnosis not present

## 2018-03-16 DIAGNOSIS — E039 Hypothyroidism, unspecified: Secondary | ICD-10-CM | POA: Diagnosis not present

## 2018-03-16 DIAGNOSIS — E1151 Type 2 diabetes mellitus with diabetic peripheral angiopathy without gangrene: Secondary | ICD-10-CM | POA: Diagnosis not present

## 2018-03-16 DIAGNOSIS — I2510 Atherosclerotic heart disease of native coronary artery without angina pectoris: Secondary | ICD-10-CM | POA: Diagnosis not present

## 2018-03-16 DIAGNOSIS — M1991 Primary osteoarthritis, unspecified site: Secondary | ICD-10-CM | POA: Diagnosis not present

## 2018-03-16 DIAGNOSIS — K219 Gastro-esophageal reflux disease without esophagitis: Secondary | ICD-10-CM

## 2018-03-16 DIAGNOSIS — E1142 Type 2 diabetes mellitus with diabetic polyneuropathy: Secondary | ICD-10-CM | POA: Diagnosis not present

## 2018-03-16 DIAGNOSIS — Z7982 Long term (current) use of aspirin: Secondary | ICD-10-CM

## 2018-03-16 DIAGNOSIS — I10 Essential (primary) hypertension: Secondary | ICD-10-CM | POA: Diagnosis not present

## 2018-03-16 DIAGNOSIS — Z7984 Long term (current) use of oral hypoglycemic drugs: Secondary | ICD-10-CM

## 2018-03-16 DIAGNOSIS — Z9181 History of falling: Secondary | ICD-10-CM

## 2018-03-16 DIAGNOSIS — F0280 Dementia in other diseases classified elsewhere without behavioral disturbance: Secondary | ICD-10-CM | POA: Diagnosis not present

## 2018-03-16 DIAGNOSIS — Z950 Presence of cardiac pacemaker: Secondary | ICD-10-CM

## 2018-03-16 DIAGNOSIS — I69351 Hemiplegia and hemiparesis following cerebral infarction affecting right dominant side: Secondary | ICD-10-CM | POA: Diagnosis not present

## 2018-03-16 DIAGNOSIS — N3281 Overactive bladder: Secondary | ICD-10-CM

## 2018-03-16 DIAGNOSIS — E785 Hyperlipidemia, unspecified: Secondary | ICD-10-CM

## 2018-03-16 DIAGNOSIS — F419 Anxiety disorder, unspecified: Secondary | ICD-10-CM | POA: Diagnosis not present

## 2018-03-16 DIAGNOSIS — I255 Ischemic cardiomyopathy: Secondary | ICD-10-CM | POA: Diagnosis not present

## 2018-03-16 DIAGNOSIS — I69320 Aphasia following cerebral infarction: Secondary | ICD-10-CM | POA: Diagnosis not present

## 2018-03-16 DIAGNOSIS — M549 Dorsalgia, unspecified: Secondary | ICD-10-CM

## 2018-03-20 ENCOUNTER — Other Ambulatory Visit: Payer: Self-pay | Admitting: Cardiovascular Disease

## 2018-03-20 DIAGNOSIS — I25700 Atherosclerosis of coronary artery bypass graft(s), unspecified, with unstable angina pectoris: Secondary | ICD-10-CM

## 2018-03-20 DIAGNOSIS — I200 Unstable angina: Secondary | ICD-10-CM

## 2018-03-21 ENCOUNTER — Telehealth: Payer: Self-pay | Admitting: Emergency Medicine

## 2018-03-21 DIAGNOSIS — E039 Hypothyroidism, unspecified: Secondary | ICD-10-CM | POA: Diagnosis not present

## 2018-03-21 DIAGNOSIS — I10 Essential (primary) hypertension: Secondary | ICD-10-CM | POA: Diagnosis not present

## 2018-03-21 DIAGNOSIS — E1142 Type 2 diabetes mellitus with diabetic polyneuropathy: Secondary | ICD-10-CM | POA: Diagnosis not present

## 2018-03-21 DIAGNOSIS — I69351 Hemiplegia and hemiparesis following cerebral infarction affecting right dominant side: Secondary | ICD-10-CM | POA: Diagnosis not present

## 2018-03-21 DIAGNOSIS — I2510 Atherosclerotic heart disease of native coronary artery without angina pectoris: Secondary | ICD-10-CM | POA: Diagnosis not present

## 2018-03-21 DIAGNOSIS — I69320 Aphasia following cerebral infarction: Secondary | ICD-10-CM | POA: Diagnosis not present

## 2018-03-21 NOTE — Telephone Encounter (Signed)
Spoke with pts husband. States pt is having accidents in the bed 1-2 times per week at the most. States he does not know if it is something they are feeding her. States they eat a lot of casseroles that are pre made and then cooked at home, fruit cocktail. Denies in nausea or vomiting. State pt has an issue with her esophagus which sometimes causes her to vomit. Would like to know if there is a diet that we can give them or should they come in for an appt.

## 2018-03-21 NOTE — Telephone Encounter (Signed)
Copied from [place]. Topic: General - Other >> Mar 20, 2018  4:36 PM Valla Leaver wrote: Reason for CRM: Patient's husband [name] would like a call back from [place] about "the problem his wife has at night". Declined to provide any additional details.

## 2018-03-21 NOTE — Telephone Encounter (Signed)
Initially having diarrhea?  Is she wearing a diaper at night?

## 2018-03-22 NOTE — Telephone Encounter (Signed)
Spoke with pts husband, states that she has not had an accident in 2 days. She does wear diaper 24 hours a day, not currently taking any laxatives.Has used Imodium in the past and it has helped. Advised to use imodium as needed and call back if symptoms worsen.   Will ask Sharee Pimple, Health Coach for any information as far as a diet plan and mail to pt if we have the information.

## 2018-03-23 ENCOUNTER — Ambulatory Visit: Payer: Medicare Other | Admitting: Physician Assistant

## 2018-03-23 DIAGNOSIS — E1142 Type 2 diabetes mellitus with diabetic polyneuropathy: Secondary | ICD-10-CM | POA: Diagnosis not present

## 2018-03-23 DIAGNOSIS — I69351 Hemiplegia and hemiparesis following cerebral infarction affecting right dominant side: Secondary | ICD-10-CM | POA: Diagnosis not present

## 2018-03-23 DIAGNOSIS — I69320 Aphasia following cerebral infarction: Secondary | ICD-10-CM | POA: Diagnosis not present

## 2018-03-23 DIAGNOSIS — I2510 Atherosclerotic heart disease of native coronary artery without angina pectoris: Secondary | ICD-10-CM | POA: Diagnosis not present

## 2018-03-23 DIAGNOSIS — I10 Essential (primary) hypertension: Secondary | ICD-10-CM | POA: Diagnosis not present

## 2018-03-23 DIAGNOSIS — E039 Hypothyroidism, unspecified: Secondary | ICD-10-CM | POA: Diagnosis not present

## 2018-03-28 DIAGNOSIS — M545 Low back pain: Secondary | ICD-10-CM | POA: Diagnosis not present

## 2018-03-28 DIAGNOSIS — I69320 Aphasia following cerebral infarction: Secondary | ICD-10-CM | POA: Diagnosis not present

## 2018-03-28 DIAGNOSIS — I69351 Hemiplegia and hemiparesis following cerebral infarction affecting right dominant side: Secondary | ICD-10-CM | POA: Diagnosis not present

## 2018-03-28 DIAGNOSIS — E039 Hypothyroidism, unspecified: Secondary | ICD-10-CM | POA: Diagnosis not present

## 2018-03-28 DIAGNOSIS — I2510 Atherosclerotic heart disease of native coronary artery without angina pectoris: Secondary | ICD-10-CM | POA: Diagnosis not present

## 2018-03-28 DIAGNOSIS — I10 Essential (primary) hypertension: Secondary | ICD-10-CM | POA: Diagnosis not present

## 2018-03-28 DIAGNOSIS — E1142 Type 2 diabetes mellitus with diabetic polyneuropathy: Secondary | ICD-10-CM | POA: Diagnosis not present

## 2018-03-29 ENCOUNTER — Other Ambulatory Visit: Payer: Self-pay | Admitting: Orthopaedic Surgery

## 2018-03-29 DIAGNOSIS — G8929 Other chronic pain: Secondary | ICD-10-CM

## 2018-03-29 DIAGNOSIS — M545 Low back pain: Principal | ICD-10-CM

## 2018-03-31 DIAGNOSIS — E1142 Type 2 diabetes mellitus with diabetic polyneuropathy: Secondary | ICD-10-CM | POA: Diagnosis not present

## 2018-03-31 DIAGNOSIS — I2510 Atherosclerotic heart disease of native coronary artery without angina pectoris: Secondary | ICD-10-CM | POA: Diagnosis not present

## 2018-03-31 DIAGNOSIS — I10 Essential (primary) hypertension: Secondary | ICD-10-CM | POA: Diagnosis not present

## 2018-03-31 DIAGNOSIS — I69320 Aphasia following cerebral infarction: Secondary | ICD-10-CM | POA: Diagnosis not present

## 2018-03-31 DIAGNOSIS — E039 Hypothyroidism, unspecified: Secondary | ICD-10-CM | POA: Diagnosis not present

## 2018-03-31 DIAGNOSIS — I69351 Hemiplegia and hemiparesis following cerebral infarction affecting right dominant side: Secondary | ICD-10-CM | POA: Diagnosis not present

## 2018-04-04 ENCOUNTER — Emergency Department (HOSPITAL_COMMUNITY): Payer: Medicare Other

## 2018-04-04 ENCOUNTER — Encounter (HOSPITAL_COMMUNITY): Payer: Self-pay | Admitting: Emergency Medicine

## 2018-04-04 ENCOUNTER — Emergency Department (HOSPITAL_COMMUNITY)
Admission: EM | Admit: 2018-04-04 | Discharge: 2018-04-04 | Disposition: A | Discharge status: Home / Self Care | Payer: Medicare Other | Attending: Emergency Medicine | Admitting: Emergency Medicine

## 2018-04-04 DIAGNOSIS — I69320 Aphasia following cerebral infarction: Secondary | ICD-10-CM | POA: Diagnosis not present

## 2018-04-04 DIAGNOSIS — Z79899 Other long term (current) drug therapy: Secondary | ICD-10-CM | POA: Diagnosis not present

## 2018-04-04 DIAGNOSIS — E860 Dehydration: Secondary | ICD-10-CM

## 2018-04-04 DIAGNOSIS — R05 Cough: Secondary | ICD-10-CM | POA: Diagnosis not present

## 2018-04-04 DIAGNOSIS — R51 Headache: Secondary | ICD-10-CM | POA: Diagnosis not present

## 2018-04-04 DIAGNOSIS — R410 Disorientation, unspecified: Secondary | ICD-10-CM | POA: Diagnosis not present

## 2018-04-04 DIAGNOSIS — E1142 Type 2 diabetes mellitus with diabetic polyneuropathy: Secondary | ICD-10-CM | POA: Diagnosis not present

## 2018-04-04 DIAGNOSIS — I2510 Atherosclerotic heart disease of native coronary artery without angina pectoris: Secondary | ICD-10-CM | POA: Insufficient documentation

## 2018-04-04 DIAGNOSIS — I10 Essential (primary) hypertension: Secondary | ICD-10-CM | POA: Diagnosis not present

## 2018-04-04 DIAGNOSIS — R402441 Other coma, without documented Glasgow coma scale score, or with partial score reported, in the field [EMT or ambulance]: Secondary | ICD-10-CM | POA: Diagnosis not present

## 2018-04-04 DIAGNOSIS — Z87891 Personal history of nicotine dependence: Secondary | ICD-10-CM | POA: Diagnosis not present

## 2018-04-04 DIAGNOSIS — E119 Type 2 diabetes mellitus without complications: Secondary | ICD-10-CM | POA: Insufficient documentation

## 2018-04-04 DIAGNOSIS — N390 Urinary tract infection, site not specified: Secondary | ICD-10-CM

## 2018-04-04 DIAGNOSIS — E039 Hypothyroidism, unspecified: Secondary | ICD-10-CM | POA: Diagnosis not present

## 2018-04-04 DIAGNOSIS — I69351 Hemiplegia and hemiparesis following cerebral infarction affecting right dominant side: Secondary | ICD-10-CM | POA: Diagnosis not present

## 2018-04-04 LAB — URINALYSIS, ROUTINE W REFLEX MICROSCOPIC
BILIRUBIN URINE: NEGATIVE
Glucose, UA: NEGATIVE mg/dL
HGB URINE DIPSTICK: NEGATIVE
KETONES UR: NEGATIVE mg/dL
NITRITE: POSITIVE — AB
PROTEIN: NEGATIVE mg/dL
Specific Gravity, Urine: 1.015 (ref 1.005–1.030)
pH: 5 (ref 5.0–8.0)

## 2018-04-04 LAB — I-STAT CHEM 8, ED
BUN: 19 mg/dL (ref 6–20)
CALCIUM ION: 1.22 mmol/L (ref 1.15–1.40)
Chloride: 106 mmol/L (ref 101–111)
Creatinine, Ser: 1 mg/dL (ref 0.44–1.00)
Glucose, Bld: 108 mg/dL — ABNORMAL HIGH (ref 65–99)
HCT: 33 % — ABNORMAL LOW (ref 36.0–46.0)
HEMOGLOBIN: 11.2 g/dL — AB (ref 12.0–15.0)
Potassium: 4.7 mmol/L (ref 3.5–5.1)
SODIUM: 138 mmol/L (ref 135–145)
TCO2: 22 mmol/L (ref 22–32)

## 2018-04-04 LAB — COMPREHENSIVE METABOLIC PANEL
ALK PHOS: 86 U/L (ref 38–126)
ALT: 15 U/L (ref 14–54)
ANION GAP: 7 (ref 5–15)
AST: 19 U/L (ref 15–41)
Albumin: 3.5 g/dL (ref 3.5–5.0)
BUN: 16 mg/dL (ref 6–20)
CALCIUM: 9.2 mg/dL (ref 8.9–10.3)
CHLORIDE: 107 mmol/L (ref 101–111)
CO2: 22 mmol/L (ref 22–32)
Creatinine, Ser: 1.04 mg/dL — ABNORMAL HIGH (ref 0.44–1.00)
GFR calc Af Amer: 55 mL/min — ABNORMAL LOW (ref >60)
GFR, EST NON AFRICAN AMERICAN: 47 mL/min — AB (ref >60)
Glucose, Bld: 110 mg/dL — ABNORMAL HIGH (ref 65–99)
Potassium: 4.7 mmol/L (ref 3.5–5.1)
SODIUM: 136 mmol/L (ref 135–145)
Total Bilirubin: 1 mg/dL (ref 0.3–1.2)
Total Protein: 6.4 g/dL — ABNORMAL LOW (ref 6.5–8.1)

## 2018-04-04 LAB — DIFFERENTIAL
BASOS PCT: 0 %
Basophils Absolute: 0 10*3/uL (ref 0.0–0.1)
EOS PCT: 2 %
Eosinophils Absolute: 0.1 10*3/uL (ref 0.0–0.7)
Lymphocytes Relative: 21 %
Lymphs Abs: 1.9 10*3/uL (ref 0.7–4.0)
MONO ABS: 0.5 10*3/uL (ref 0.1–1.0)
Monocytes Relative: 6 %
Neutro Abs: 6.7 10*3/uL (ref 1.7–7.7)
Neutrophils Relative %: 71 %

## 2018-04-04 LAB — CBC
HCT: 34.7 % — ABNORMAL LOW (ref 36.0–46.0)
Hemoglobin: 11.2 g/dL — ABNORMAL LOW (ref 12.0–15.0)
MCH: 26.1 pg (ref 26.0–34.0)
MCHC: 32.3 g/dL (ref 30.0–36.0)
MCV: 80.9 fL (ref 78.0–100.0)
PLATELETS: 251 10*3/uL (ref 150–400)
RBC: 4.29 MIL/uL (ref 3.87–5.11)
RDW: 14.8 % (ref 11.5–15.5)
WBC: 9.3 10*3/uL (ref 4.0–10.5)

## 2018-04-04 LAB — RAPID URINE DRUG SCREEN, HOSP PERFORMED
AMPHETAMINES: NOT DETECTED
Barbiturates: NOT DETECTED
Benzodiazepines: NOT DETECTED
Cocaine: NOT DETECTED
OPIATES: NOT DETECTED
Tetrahydrocannabinol: NOT DETECTED

## 2018-04-04 LAB — ETHANOL

## 2018-04-04 LAB — I-STAT TROPONIN, ED: TROPONIN I, POC: 0 ng/mL (ref 0.00–0.08)

## 2018-04-04 LAB — APTT: aPTT: 24 seconds (ref 24–36)

## 2018-04-04 LAB — PROTIME-INR
INR: 0.99
PROTHROMBIN TIME: 13 s (ref 11.4–15.2)

## 2018-04-04 MED ORDER — CEPHALEXIN 500 MG PO CAPS: 500.0000 mg | ORAL_CAPSULE | Freq: Four times a day (QID) | ORAL | 0 refills | Status: DC

## 2018-04-04 MED ORDER — CEFTRIAXONE SODIUM 1 G IJ SOLR
1.0000 g | Freq: Once | INTRAMUSCULAR | Status: AC
  Administered 2018-04-04: 1 g via INTRAVENOUS
  Filled 2018-04-04: qty 10

## 2018-04-04 MED ORDER — LACTATED RINGERS IV BOLUS
1000.0000 mL | Freq: Once | INTRAVENOUS | Status: AC
  Administered 2018-04-04: 1000 mL via INTRAVENOUS

## 2018-04-04 NOTE — ED Triage Notes (Signed)
Per EMS, pt from home. Pt had previous stroke on December 4th. Pt has been doing Physical Therapy two times a week. Pt has had deficits of expressive aphasia and rt sided weakness mostly on the leg. Physical Therapy reported that something was wrong today compared to normal. Pt unable to answer any orientation questions like normal. Pt's husband reports LSN at 0900 today. Physical Therapy also reports pt unable to ambulate on her own.   EMS VS BP 108/58, HR 60, 97% RA, and CBG 148.

## 2018-04-04 NOTE — ED Provider Notes (Signed)
Emergency Department Provider Note   I have reviewed the triage vital signs and the nursing notes.   HISTORY  Chief Complaint Stroke Symptoms   HPI Janet Steele is a 84 y.o. female with multiple medical problems as documented below the presents the emergency department today secondary to disorientation.  Patient gets physical therapy or some type of therapy at home and the therapist came by today and the patient was disoriented not knowing the year, situation or where she was.  This was a change from yesterday.  Patient apparently also had a low blood pressure of 108/50 with EMS.  Apparently was told to come here to make sure she did have a repeat stroke.  No noticed weakness per the husband's report.  Patient disoriented does not give a great history. No other associated or modifying symptoms.    Past Medical History:  Diagnosis Date  . Anemia   . Anxiety   . Arthritis    "fingers" (05/19/2016  . CAD (coronary artery disease)    a. s/p CABG 1982 b. redo CABG 2003. c. [place] despite med rx 05/2016: successful but complicated PCI of PDA beyond graft site, c/b localized dissection requiring overlapping stent.  . Carotid bruit    a. Duplex 01/2015: patent vessels, 1-39% BICA, f/u PRN recommened.  . Colon polyp    adenomatous  . Colon, diverticulosis   . Degenerative joint disease   . Diastolic dysfunction   . Esophageal stricture   . GERD (gastroesophageal reflux disease)   . Hiatal hernia   . History of blood transfusion    "related to my bypass"  . Hyperlipidemia   . Hypertension   . Hypothyroidism   . Ischemic cardiomyopathy    a. Cath 05/2016: EF 40% with severe inferior wall HK, suspect hibernating myocardium.  . Myocardial infarction ([place]) 1977; 1982  . Osteoarthrosis, unspecified whether generalized or localized, unspecified site   . Presence of permanent cardiac pacemaker   . PVD (peripheral vascular disease) ([place])   . Type II diabetes mellitus ([place])   . Unspecified  hearing loss     Patient Active Problem List   Diagnosis Date Noted  . History of CVA (cerebrovascular accident) 01/07/2018  . Stroke ([place]) 11/04/2017  . Weakness 11/03/2017  . History of cerebrovascular accident (CVA) with residual deficit 11/03/2017  . Chronic back pain 07/04/2017  . Rib pain on left side 04/20/2017  . PAD (peripheral artery disease) ([place]) 01/17/2017  . Overactive bladder 12/17/2016  . Anemia 05/20/2016  . Coronary artery disease involving coronary bypass graft of native heart without angina pectoris 05/19/2016  . Numbness and tingling of foot 12/31/2015  . MCI (mild cognitive impairment) with memory loss 11/18/2015  . Facial tic 05/07/2015  . Facial nerve spasm 05/07/2015  . Sick sinus syndrome ([place]) 02/26/2015  . Cervical dystonia 12/04/2014  . Chronic motor tic 09/12/2014  . Nonspecific abnormal electrocardiogram (ECG) (EKG)   . Unstable angina ([place]) 03/02/2014  . Pacemaker-Medtronic 07/07/2012  . Bradycardia 07/05/2012  . GERD (gastroesophageal reflux disease) 07/05/2012  . Near syncope 12/23/2011  . Abnormal involuntary movement 12/17/2009  . Disturbance in sleep behavior 12/11/2009  . CAD, ARTERY BYPASS GRAFT 09/24/2009  . CAROTID BRUIT 09/24/2009  . Diabetes ([place]) 01/30/2009  . DYSPHAGIA 06/19/2008  . Anxiety 02/14/2008  . EROSIVE ESOPHAGITIS 02/14/2008  . CONSTIPATION, CHRONIC 02/14/2008  . DEGENERATIVE JOINT DISEASE 02/14/2008  . HEARING LOSS 01/04/2008  . PVD 01/04/2008  . Hypothyroidism 09/25/2007  . HYPERLIPIDEMIA 09/25/2007  .  Essential hypertension 09/07/2007  . Diabetic polyneuropathy ([place]) 03/16/2007  . Stricture and stenosis of esophagus 03/16/2007  . HIATAL HERNIA 02/16/2007  . HEMORRHOIDS 12/31/1997  . DIVERTICULOSIS, COLON 12/31/1997    Past Surgical History:  Procedure Laterality Date  . ABDOMINAL HYSTERECTOMY    . APPENDECTOMY    . BACK SURGERY    . CARDIAC CATHETERIZATION N/A 05/19/2016   Procedure: Left Heart Cath and  Cors/Grafts Angiography;  Surgeon: Belva Crome, MD;  Location: [facility];  Service: Cardiovascular;  Laterality: N/A;  . CARDIAC CATHETERIZATION N/A 05/19/2016   Procedure: Coronary Stent Intervention;  Surgeon: Belva Crome, MD;  Location: [facility];  Service: Cardiovascular;  Laterality: N/A;  . CATARACT EXTRACTION W/ INTRAOCULAR LENS  IMPLANT, BILATERAL Bilateral   . CHOLECYSTECTOMY OPEN    . COLONOSCOPY    . CORONARY ANGIOPLASTY WITH STENT PLACEMENT  05/19/2016   "2 stents?"  . CORONARY ARTERY BYPASS GRAFT  1982; 2003   x4(1982),02-2002 CABG X2  . ESOPHAGOGASTRODUODENOSCOPY (EGD) WITH ESOPHAGEAL DILATION  "2-3 times"  . INSERT / REPLACE / REMOVE PACEMAKER    . LEFT HEART CATHETERIZATION WITH CORONARY/GRAFT ANGIOGRAM N/A 03/05/2014   Procedure: LEFT HEART CATHETERIZATION WITH Beatrix Fetters;  Surgeon: Sinclair Grooms, MD;  Location[facility];  Service: Cardiovascular;  Laterality: N/A;  . LUMBAR [place] SURGERY  01-2000  . OOPHORECTOMY Bilateral   . PERMANENT PACEMAKER INSERTION N/A 07/06/2012   MDT Adapta L implanted by Dr Rayann Heman for symptomatic bradycardia  . TONSILLECTOMY  1930s    Current Outpatient Rx  . Order #: [id number] Class: OTC  . Order #: [id number] Class: Normal  . Order #: [id number] Class: Normal  . Order #: [id number] Class: Normal  . Order #: [id number] Class: Normal  . Order #: [id number] Class: Normal  . Order #: [id number] Class: Normal  . Order #: [id number] Class: Normal  . Order #: [id number] Class: Normal  . Order #: [id number] Class: Normal  . Order #: [id number] Class: Normal  . Order #: [id number] Class: Print  . Order #: [id number] Class: Normal  . Order #: [id number] Class: Historical Med  . Order #: [id number] Class: Normal    Allergies Morphine; Penicillins; Prednisone; Sulfonamide derivatives; Codeine; Hydrocodone-acetaminophen; Meperidine hcl; Metoclopramide hcl; Metoprolol succinate; Moxifloxacin; Oxycodone-aspirin; Pentazocine lactate; and  Pioglitazone  Family History  Problem Relation Age of Onset  . Heart disease Sister   . Heart attack Sister   . Colon cancer Neg Hx   . Breast cancer Neg Hx   . Celiac disease Neg Hx   . Cirrhosis Neg Hx   . Clotting disorder Neg Hx   . Colitis Neg Hx   . Colon polyps Neg Hx   . Crohn's disease Neg Hx   . Cystic fibrosis Neg Hx   . Diabetes Neg Hx   . Esophageal cancer Neg Hx   . Hemochromatosis Neg Hx   . Inflammatory bowel disease Neg Hx   . Irritable bowel syndrome Neg Hx   . Kidney disease Neg Hx   . Liver cancer Neg Hx   . Liver disease Neg Hx   . Ovarian cancer Neg Hx   . Pancreatic cancer Neg Hx   . Prostate cancer Neg Hx   . Rectal cancer Neg Hx   . Stomach cancer Neg Hx   . Ulcerative colitis Neg Hx   . Uterine cancer Neg Hx   . Wilson's disease Neg Hx     Social History Social History   Tobacco Use  . Smoking status: Former Smoker  Packs/day: 0.50    Years: 30.00    Pack years: 15.00    Types: Cigarettes    Last attempt to quit: 07/05/1976    Years since quitting: 41.7  . Smokeless tobacco: Never Used  Substance Use Topics  . Alcohol use: No    Alcohol/week: 1.8 oz    Types: 3 Glasses of wine per week    Frequency: Never    Comment: "not anymore, I haven't in I don't know years"  . Drug use: No    Review of Systems  All other systems negative except as documented in the HPI. All pertinent positives and negatives as reviewed in the HPI. ____________________________________________   PHYSICAL EXAM:  VITAL SIGNS: ED Triage Vitals  Enc Vitals Group     BP 04/04/18 1511 (!) 98/45     Pulse Rate 04/04/18 1511 67     Resp 04/04/18 1511 (!) 21     Temp 04/04/18 1511 98.7 F (37.1 C)     Temp Source 04/04/18 1511 Oral     SpO2 04/04/18 1511 94 %     Weight 04/04/18 1526 115 lb (52.2 kg)     Height 04/04/18 1526 5\' 1"  (1.549 m)    Constitutional: Alert and disoriented to place, time situation but is well appearing and in no acute  distress. Eyes: Conjunctivae are normal. PERRL. EOMI. Head: Atraumatic. Nose: No congestion/rhinnorhea. Mouth/Throat: Mucous membranes are dry.  Oropharynx non-erythematous. Does have some lip smacking and tongue rolling behavior Neck: No stridor.  No meningeal signs.   Cardiovascular: Normal rate, regular rhythm. Good peripheral circulation. Grossly normal heart sounds.   Respiratory: Normal respiratory effort.  No retractions. Lungs CTAB. Occasional dry hacking cough.  Gastrointestinal: Soft and nontender. No distention.  Musculoskeletal: No lower extremity tenderness nor edema. No gross deformities of extremities. Neurologic:  Normal speech and language. No gross focal neurologic deficits are appreciated but is disoriented as above. Also states that her sensation is intact but I am not sure she is just saying that because she sees me touch her. Rhythmic jerking of her left foot that is intermittent. Doesn't seem intentional Skin:  Skin is warm, dry and intact. No rash noted.   ____________________________________________   LABS (all labs ordered are listed, but only abnormal results are displayed)  Labs Reviewed  CBC - Abnormal; Notable for the following components:      Result Value   Hemoglobin 11.2 (*)    HCT 34.7 (*)    All other components within normal limits  COMPREHENSIVE METABOLIC PANEL - Abnormal; Notable for the following components:   Glucose, Bld 110 (*)    Creatinine, Ser 1.04 (*)    Total Protein 6.4 (*)    GFR calc non Af Amer 47 (*)    GFR calc Af Amer 55 (*)    All other components within normal limits  URINALYSIS, ROUTINE W REFLEX MICROSCOPIC - Abnormal; Notable for the following components:   APPearance HAZY (*)    Nitrite POSITIVE (*)    Leukocytes, UA MODERATE (*)    WBC, UA >50 (*)    Bacteria, UA MANY (*)    All other components within normal limits  I-STAT CHEM 8, ED - Abnormal; Notable for the following components:   Glucose, Bld 108 (*)     Hemoglobin 11.2 (*)    HCT 33.0 (*)    All other components within normal limits  ETHANOL  PROTIME-INR  APTT  DIFFERENTIAL  RAPID URINE DRUG SCREEN, HOSP PERFORMED  I-STAT TROPONIN, ED   ____________________________________________  EKG   EKG Interpretation  Date/Time:  Tuesday April 04 2018 15:33:51 EDT Ventricular Rate:  65 PR Interval:    QRS Duration: 116 QT Interval:  424 QTC Calculation: 441 R Axis:   78 Text Interpretation:  Sinus rhythm Nonspecific intraventricular conduction delay Borderline T abnormalities, inferior leads improved TWI in multiple leads compared to november 2018 Confirmed by Merrily Pew [phone]) on 04/04/2018 4:52:24 PM       ____________________________________________  RADIOLOGY  Dg Chest 2 View  Result Date: 04/04/2018 CLINICAL DATA:  Cough and altered mental status. EXAM: CHEST - 2 VIEW COMPARISON:  Chest x-ray dated November 03, 2017. FINDINGS: Unchanged left chest wall AICD. The heart size and mediastinal contours are within normal limits. Prior CABG. Normal pulmonary vascularity. Atherosclerotic calcification of the aortic arch. No focal consolidation, pleural effusion, or pneumothorax. No acute osseous abnormality. IMPRESSION: No active cardiopulmonary disease. Electronically Signed   By: Titus Dubin M.D.   On: 04/04/2018 15:56   Ct Head Wo Contrast  Result Date: 04/04/2018 CLINICAL DATA:  Headache. EXAM: CT HEAD WITHOUT CONTRAST TECHNIQUE: Contiguous axial images were obtained from the base of the skull through the vertex without intravenous contrast. COMPARISON:  CT scan of October 08, 2017. FINDINGS: Brain: Mild chronic ischemic white matter disease is noted. No mass effect or midline shift is noted. Ventricular size is within normal limits. There is no evidence of mass lesion, hemorrhage or acute infarction. Vascular: No hyperdense vessel or unexpected calcification. Skull: Normal. Negative for fracture or focal lesion. Sinuses/Orbits:  No acute finding. Other: None. IMPRESSION: Mild chronic ischemic white matter disease. No acute intracranial abnormality seen. Electronically Signed   By: Marijo Conception, M.D.   On: 04/04/2018 17:17    ____________________________________________   PROCEDURES  Procedure(s) performed:   Procedures   ____________________________________________   INITIAL IMPRESSION / ASSESSMENT AND PLAN / ED COURSE  Maybe stroke but secondary to the soft blood pressures and disorientation seems to be more consistent with hypovolemia, infection or other metabolic cause for altered mental status.  We will do a CT of her head anyway but at the same time evaluate for other reasons for her to be disoriented.  As far as the abnormal movements of her leg and her mouth it does not appear she is on any antipsychotics that would cause this.  She also denies any medications nor does have a diagnosis of Parkinson's.  The leg movement is on the left which is a side of her previous strokes or could be related to that however her strength there is seems to be normal in her muscle tone and mass seems to be normal as well so I am not exactly clear what that is from.  Found to have a UTI/dehydration as likely cause for her symptoms. No obvious neurologic cause. Will dc on keflex w/ pcp follow up.    Pertinent labs & imaging results that were available during my care of the patient were reviewed by me and considered in my medical decision making (see chart for details).  ____________________________________________  FINAL CLINICAL IMPRESSION(S) / ED DIAGNOSES  Final diagnoses:  Urinary tract infection without hematuria, site unspecified  Dehydration     MEDICATIONS GIVEN DURING THIS VISIT:  Medications  lactated ringers bolus 1,000 mL (0 mLs Intravenous Stopped 04/04/18 1855)  lactated ringers bolus 1,000 mL (0 mLs Intravenous Stopped 04/04/18 2108)  cefTRIAXone (ROCEPHIN) 1 g in sodium chloride 0.9 % 100 mL IVPB  (  0 g Intravenous Stopped 04/04/18 2033)     NEW OUTPATIENT MEDICATIONS STARTED DURING THIS VISIT:  Discharge Medication List as of 04/04/2018  7:55 PM    START taking these medications   Details  cephALEXin (KEFLEX) 500 MG capsule Take 1 capsule (500 mg total) by mouth 4 (four) times daily., Starting Tue 04/04/2018, Print        Note:  This note was prepared with assistance of Dragon voice recognition software. Occasional wrong-word or sound-a-like substitutions may have occurred due to the inherent limitations of voice recognition software.   Merrily Pew, MD 04/04/18 [phone]

## 2018-04-04 NOTE — ED Notes (Signed)
Pt/family verbalizes understanding of d/c instructions. Pt's son received prescriptions. Pt taken out to lobby in wheelchair at d/c with all belongings.

## 2018-04-04 NOTE — ED Notes (Signed)
Pt family asking when doctor would be back in to let them know if she was going to be staying or not.

## 2018-04-04 NOTE — ED Notes (Signed)
Patient transported to X-ray 

## 2018-04-04 NOTE — ED Notes (Signed)
ED Provider at bedside. 

## 2018-04-05 DIAGNOSIS — H5212 Myopia, left eye: Secondary | ICD-10-CM | POA: Diagnosis not present

## 2018-04-05 DIAGNOSIS — E119 Type 2 diabetes mellitus without complications: Secondary | ICD-10-CM | POA: Diagnosis not present

## 2018-04-05 DIAGNOSIS — H26493 Other secondary cataract, bilateral: Secondary | ICD-10-CM | POA: Diagnosis not present

## 2018-04-05 DIAGNOSIS — Z961 Presence of intraocular lens: Secondary | ICD-10-CM | POA: Diagnosis not present

## 2018-04-05 LAB — HM DIABETES EYE EXAM

## 2018-04-06 DIAGNOSIS — I69351 Hemiplegia and hemiparesis following cerebral infarction affecting right dominant side: Secondary | ICD-10-CM | POA: Diagnosis not present

## 2018-04-06 DIAGNOSIS — E1142 Type 2 diabetes mellitus with diabetic polyneuropathy: Secondary | ICD-10-CM | POA: Diagnosis not present

## 2018-04-06 DIAGNOSIS — E039 Hypothyroidism, unspecified: Secondary | ICD-10-CM | POA: Diagnosis not present

## 2018-04-06 DIAGNOSIS — I10 Essential (primary) hypertension: Secondary | ICD-10-CM | POA: Diagnosis not present

## 2018-04-06 DIAGNOSIS — I2510 Atherosclerotic heart disease of native coronary artery without angina pectoris: Secondary | ICD-10-CM | POA: Diagnosis not present

## 2018-04-06 DIAGNOSIS — I69320 Aphasia following cerebral infarction: Secondary | ICD-10-CM | POA: Diagnosis not present

## 2018-04-07 ENCOUNTER — Encounter: Payer: Self-pay | Admitting: Internal Medicine

## 2018-04-07 ENCOUNTER — Telehealth: Payer: Self-pay | Admitting: Emergency Medicine

## 2018-04-07 NOTE — Telephone Encounter (Signed)
This was faxed and sent to scan this week. Spoke with pts husband to inform.

## 2018-04-07 NOTE — Telephone Encounter (Signed)
Copied from [place] [phone]. Topic: Inquiry >> Apr 06, 2018 12:37 PM Conception Chancy, NT wrote: Reason for CRM: patient husband is calling and is requesting Lovena Le, Dr. Quay Burow nurse to give him a call in regards to ordering braces for the patient.

## 2018-04-11 DIAGNOSIS — E1142 Type 2 diabetes mellitus with diabetic polyneuropathy: Secondary | ICD-10-CM | POA: Diagnosis not present

## 2018-04-11 DIAGNOSIS — I69351 Hemiplegia and hemiparesis following cerebral infarction affecting right dominant side: Secondary | ICD-10-CM | POA: Diagnosis not present

## 2018-04-11 DIAGNOSIS — E039 Hypothyroidism, unspecified: Secondary | ICD-10-CM | POA: Diagnosis not present

## 2018-04-11 DIAGNOSIS — I2510 Atherosclerotic heart disease of native coronary artery without angina pectoris: Secondary | ICD-10-CM | POA: Diagnosis not present

## 2018-04-11 DIAGNOSIS — I69320 Aphasia following cerebral infarction: Secondary | ICD-10-CM | POA: Diagnosis not present

## 2018-04-11 DIAGNOSIS — I10 Essential (primary) hypertension: Secondary | ICD-10-CM | POA: Diagnosis not present

## 2018-04-11 NOTE — Progress Notes (Signed)
Subjective:    Patient ID: Janet Steele, female    DOB: [birth year], 84 y.o.   MRN: [id number]  HPI The patient is here for follow up from the hospital.  ED 04/04/18 - she was brought to the ED for disorientation.  PT noticed that she was not aware of knowing the year, situation or where she was.  This was a changed compared to the day prior.  Her BP was 108/50 with EMS.  She was brought to ED to evaluate for new possible stroke.  There was no noticed weakness per her husband.    Work up in the ED showed that UTI and dehydration as lthe likely cause of her symptoms.  No evidence of new stroke.  She received IVF, ceftriaxone.  She was discharged on keflex.    She is here alone today.  Her husband is in the waiting room.  She denies fever, chills.  She denies abdominal pain and urinary symptoms.  She has no complaints.    She states she drinks plenty of water and is eating good.   Medications and allergies reviewed with patient and updated if appropriate.  Patient Active Problem List   Diagnosis Date Noted  . History of CVA (cerebrovascular accident) 01/07/2018  . Stroke ([place]) 11/04/2017  . Weakness 11/03/2017  . History of cerebrovascular accident (CVA) with residual deficit 11/03/2017  . Chronic back pain 07/04/2017  . Rib pain on left side 04/20/2017  . PAD (peripheral artery disease) ([place]) 01/17/2017  . Overactive bladder 12/17/2016  . Anemia 05/20/2016  . Coronary artery disease involving coronary bypass graft of native heart without angina pectoris 05/19/2016  . Numbness and tingling of foot 12/31/2015  . MCI (mild cognitive impairment) with memory loss 11/18/2015  . Facial tic 05/07/2015  . Facial nerve spasm 05/07/2015  . Sick sinus syndrome ([place]) 02/26/2015  . Cervical dystonia 12/04/2014  . Chronic motor tic 09/12/2014  . Nonspecific abnormal electrocardiogram (ECG) (EKG)   . Unstable angina ([place]) 03/02/2014  . Pacemaker-Medtronic 07/07/2012  . Bradycardia  07/05/2012  . GERD (gastroesophageal reflux disease) 07/05/2012  . Near syncope 12/23/2011  . Abnormal involuntary movement 12/17/2009  . Disturbance in sleep behavior 12/11/2009  . CAD, ARTERY BYPASS GRAFT 09/24/2009  . CAROTID BRUIT 09/24/2009  . Diabetes ([place]) 01/30/2009  . DYSPHAGIA 06/19/2008  . Anxiety 02/14/2008  . EROSIVE ESOPHAGITIS 02/14/2008  . CONSTIPATION, CHRONIC 02/14/2008  . DEGENERATIVE JOINT DISEASE 02/14/2008  . HEARING LOSS 01/04/2008  . PVD 01/04/2008  . Hypothyroidism 09/25/2007  . HYPERLIPIDEMIA 09/25/2007  . Essential hypertension 09/07/2007  . Diabetic polyneuropathy ([place]) 03/16/2007  . Stricture and stenosis of esophagus 03/16/2007  . HIATAL HERNIA 02/16/2007  . HEMORRHOIDS 12/31/1997  . DIVERTICULOSIS, COLON 12/31/1997    Current Outpatient Medications on File Prior to Visit  Medication Sig Dispense Refill  . acetaminophen (TYLENOL) 500 MG tablet Take 2 tablets (1,000 mg total) by mouth every 6 (six) hours as needed. 30 tablet 0  . atorvastatin (LIPITOR) 80 MG tablet Take 1 tablet (80 mg total) by mouth daily. 90 tablet 1  . cephALEXin (KEFLEX) 500 MG capsule Take 1 capsule (500 mg total) by mouth 4 (four) times daily. 28 capsule 0  . clopidogrel (PLAVIX) 75 MG tablet TAKE 1 TABLET BY MOUTH DAILY 30 tablet 0  . DULoxetine (CYMBALTA) 60 MG capsule Take 1 capsule (60 mg total) by mouth daily. 90 capsule 1  . levothyroxine (SYNTHROID, LEVOTHROID) 137 MCG tablet Take 1 tablet (137 mcg total)  by mouth daily before breakfast. 90 tablet 0  . losartan (COZAAR) 50 MG tablet take 1 tablet by mouth once daily 90 tablet 1  . metFORMIN (GLUCOPHAGE) 1000 MG tablet Take 1 tablet (1,000 mg total) by mouth 2 (two) times daily with a meal. 180 tablet 1  . metoprolol tartrate (LOPRESSOR) 25 MG tablet TAKE 1 TABLET BY MOUTH TWICE DAILY 60 tablet 0  . mupirocin ointment (BACTROBAN) 2 %     . nitroGLYCERIN (NITROSTAT) 0.4 MG SL tablet Place 1 tablet (0.4 mg total) under the  tongue every 5 (five) minutes as needed. For chest pain 25 tablet 5  . pantoprazole (PROTONIX) 40 MG tablet Take 1 tablet (40 mg total) by mouth 2 (two) times daily. 180 tablet 1  . RA ASPIRIN EC 81 MG EC tablet take 1 tablet by mouth once daily . OVERDUE FOR FOLLOW UP. PLEASE CALL AND SCHEDULE [phone]. 15 tablet 0  . sitaGLIPtin (JANUVIA) 50 MG tablet Take 1 tablet (50 mg total) by mouth daily. 90 tablet 1  . traZODone (DESYREL) 50 MG tablet Take 2 tablets (100 mg total) by mouth at bedtime. 180 tablet 0   No current facility-administered medications on file prior to visit.     Past Medical History:  Diagnosis Date  . Anemia   . Anxiety   . Arthritis    "fingers" (05/19/2016  . CAD (coronary artery disease)    a. s/p CABG 1982 b. redo CABG 2003. c. [place] despite med rx 05/2016: successful but complicated PCI of PDA beyond graft site, c/b localized dissection requiring overlapping stent.  . Carotid bruit    a. Duplex 01/2015: patent vessels, 1-39% BICA, f/u PRN recommened.  . Colon polyp    adenomatous  . Colon, diverticulosis   . Degenerative joint disease   . Diastolic dysfunction   . Esophageal stricture   . GERD (gastroesophageal reflux disease)   . Hiatal hernia   . History of blood transfusion    "related to my bypass"  . Hyperlipidemia   . Hypertension   . Hypothyroidism   . Ischemic cardiomyopathy    a. Cath 05/2016: EF 40% with severe inferior wall HK, suspect hibernating myocardium.  . Myocardial infarction ([place]) 1977; 1982  . Osteoarthrosis, unspecified whether generalized or localized, unspecified site   . Presence of permanent cardiac pacemaker   . PVD (peripheral vascular disease) ([place])   . Type II diabetes mellitus ([place])   . Unspecified hearing loss     Past Surgical History:  Procedure Laterality Date  . ABDOMINAL HYSTERECTOMY    . APPENDECTOMY    . BACK SURGERY    . CARDIAC CATHETERIZATION N/A 05/19/2016   Procedure: Left Heart Cath and Cors/Grafts  Angiography;  Surgeon: Belva Crome, MD;  Location: [facility];  Service: Cardiovascular;  Laterality: N/A;  . CARDIAC CATHETERIZATION N/A 05/19/2016   Procedure: Coronary Stent Intervention;  Surgeon: Belva Crome, MD;  Location: [facility];  Service: Cardiovascular;  Laterality: N/A;  . CATARACT EXTRACTION W/ INTRAOCULAR LENS  IMPLANT, BILATERAL Bilateral   . CHOLECYSTECTOMY OPEN    . COLONOSCOPY    . CORONARY ANGIOPLASTY WITH STENT PLACEMENT  05/19/2016   "2 stents?"  . CORONARY ARTERY BYPASS GRAFT  1982; 2003   x4(1982),02-2002 CABG X2  . ESOPHAGOGASTRODUODENOSCOPY (EGD) WITH ESOPHAGEAL DILATION  "2-3 times"  . INSERT / REPLACE / REMOVE PACEMAKER    . LEFT HEART CATHETERIZATION WITH CORONARY/GRAFT ANGIOGRAM N/A 03/05/2014   Procedure: LEFT HEART  CATHETERIZATION WITH Beatrix Fetters;  Surgeon: Sinclair Grooms, MD;  Location: [facility];  Service: Cardiovascular;  Laterality: N/A;  . LUMBAR [place] SURGERY  01-2000  . OOPHORECTOMY Bilateral   . PERMANENT PACEMAKER INSERTION N/A 07/06/2012   MDT Adapta L implanted by Dr Rayann Heman for symptomatic bradycardia  . TONSILLECTOMY  1930s    Social History   Socioeconomic History  . Marital status: Married    Spouse name: [name]  . Number of children: 3  . Years of education: 61  . Highest education level: Not on file  Occupational History  . Occupation: Retired     Fish farm manager: RETIRED  Social Needs  . Financial resource strain: Not on file  . Food insecurity:    Worry: Not on file    Inability: Not on file  . Transportation needs:    Medical: Not on file    Non-medical: Not on file  Tobacco Use  . Smoking status: Former Smoker    Packs/day: 0.50    Years: 30.00    Pack years: 15.00    Types: Cigarettes    Last attempt to quit: 07/05/1976    Years since quitting: 41.7  . Smokeless tobacco: Never Used  Substance and Sexual Activity  . Alcohol use: No    Alcohol/week: 1.8 oz    Types: 3 Glasses of wine per  week    Frequency: Never    Comment: "not anymore, I haven't in I don't know years"  . Drug use: No  . Sexual activity: Never  Lifestyle  . Physical activity:    Days per week: Not on file    Minutes per session: Not on file  . Stress: Not on file  Relationships  . Social connections:    Talks on phone: Not on file    Gets together: Not on file    Attends religious service: Not on file    Active member of club or organization: Not on file    Attends meetings of clubs or organizations: Not on file    Relationship status: Not on file  Other Topics Concern  . Not on file  Social History Narrative   Patient is married [name]) and lives at home with her husband.   Patient has three adult children.   Patient is retired.   Patient has a college education.   Patient is right-handed.   Patient does not drink any caffeine.    Family History  Problem Relation Age of Onset  . Heart disease Sister   . Heart attack Sister   . Colon cancer Neg Hx   . Breast cancer Neg Hx   . Celiac disease Neg Hx   . Cirrhosis Neg Hx   . Clotting disorder Neg Hx   . Colitis Neg Hx   . Colon polyps Neg Hx   . Crohn's disease Neg Hx   . Cystic fibrosis Neg Hx   . Diabetes Neg Hx   . Esophageal cancer Neg Hx   . Hemochromatosis Neg Hx   . Inflammatory bowel disease Neg Hx   . Irritable bowel syndrome Neg Hx   . Kidney disease Neg Hx   . Liver cancer Neg Hx   . Liver disease Neg Hx   . Ovarian cancer Neg Hx   . Pancreatic cancer Neg Hx   . Prostate cancer Neg Hx   . Rectal cancer Neg Hx   . Stomach cancer Neg Hx   . Ulcerative colitis Neg Hx   .  Uterine cancer Neg Hx   . Wilson's disease Neg Hx     Review of Systems  Constitutional: Negative for appetite change, chills and fever.  Respiratory: Negative for shortness of breath.   Cardiovascular: Negative for chest pain, palpitations and leg swelling.  Gastrointestinal: Positive for nausea (chronic). Negative for abdominal pain.    Genitourinary: Negative for difficulty urinating, dysuria, frequency, hematuria and pelvic pain.  Neurological: Positive for headaches. Negative for dizziness and light-headedness.       Objective:   Vitals:   04/12/18 1306  BP: (!) 110/54  Pulse: 68  Resp: 16  Temp: 97.7 F (36.5 C)  SpO2: 94%   BP Readings from Last 3 Encounters:  04/12/18 (!) 110/54  04/04/18 107/60  02/20/18 120/74   Wt Readings from Last 3 Encounters:  04/04/18 115 lb (52.2 kg)  02/20/18 112 lb (50.8 kg)  01/11/18 127 lb (57.6 kg)   There is no height or weight on file to calculate BMI.   Physical Exam    Constitutional: Appears well-developed and well-nourished. No distress.  HENT:  Head: Normocephalic and atraumatic.  Neck: Neck supple. No tracheal deviation present. No thyromegaly present.  No cervical lymphadenopathy Cardiovascular: Normal rate, regular rhythm and normal heart sounds.   No murmur heard. No carotid bruit .  No edema Pulmonary/Chest: Effort normal and breath sounds normal. No respiratory distress. No has no wheezes. No rales.  Abdomen: soft, ND, NT Skin: Skin is warm and dry. Not diaphoretic.  Psychiatric: Normal mood and affect. Behavior is normal.   CT HEAD WO CONTRAST CLINICAL DATA:  Headache.  EXAM: CT HEAD WITHOUT CONTRAST  TECHNIQUE: Contiguous axial images were obtained from the base of the skull through the vertex without intravenous contrast.  COMPARISON:  CT scan of October 08, 2017.  FINDINGS: Brain: Mild chronic ischemic white matter disease is noted. No mass effect or midline shift is noted. Ventricular size is within normal limits. There is no evidence of mass lesion, hemorrhage or acute infarction.  Vascular: No hyperdense vessel or unexpected calcification.  Skull: Normal. Negative for fracture or focal lesion.  Sinuses/Orbits: No acute finding.  Other: None.  IMPRESSION: Mild chronic ischemic white matter disease. No acute  intracranial abnormality seen.  Electronically Signed   By: Marijo Conception, M.D.   On: 04/04/2018 17:17 DG Chest 2 View CLINICAL DATA:  Cough and altered mental status.  EXAM: CHEST - 2 VIEW  COMPARISON:  Chest x-ray dated November 03, 2017.  FINDINGS: Unchanged left chest wall AICD. The heart size and mediastinal contours are within normal limits. Prior CABG. Normal pulmonary vascularity. Atherosclerotic calcification of the aortic arch. No focal consolidation, pleural effusion, or pneumothorax. No acute osseous abnormality.  IMPRESSION: No active cardiopulmonary disease.  Electronically Signed   By: Titus Dubin M.D.   On: 04/04/2018 15:56  Lab Results  Component Value Date   WBC 9.3 04/04/2018   HGB 11.2 (L) 04/04/2018   HCT 33.0 (L) 04/04/2018   PLT 251 04/04/2018   GLUCOSE 108 (H) 04/04/2018   CHOL 165 02/20/2018   TRIG 150.0 (H) 02/20/2018   HDL 31.70 (L) 02/20/2018   LDLDIRECT 254.0 11/01/2017   LDLCALC 104 (H) 02/20/2018   ALT 15 04/04/2018   AST 19 04/04/2018   NA 138 04/04/2018   K 4.7 04/04/2018   CL 106 04/04/2018   CREATININE 1.00 04/04/2018   BUN 19 04/04/2018   CO2 22 04/04/2018   TSH 0.06 (L) 02/20/2018   INR 0.99 04/04/2018  HGBA1C 6.7 (H) 02/20/2018   MICROALBUR 3.6 (H) 05/28/2010     Assessment & Plan:    See Problem List for Assessment and Plan of chronic medical problems.

## 2018-04-12 ENCOUNTER — Encounter: Payer: Self-pay | Admitting: Internal Medicine

## 2018-04-12 ENCOUNTER — Ambulatory Visit (INDEPENDENT_AMBULATORY_CARE_PROVIDER_SITE_OTHER): Payer: Medicare Other | Admitting: Internal Medicine

## 2018-04-12 DIAGNOSIS — I10 Essential (primary) hypertension: Secondary | ICD-10-CM | POA: Diagnosis not present

## 2018-04-12 DIAGNOSIS — I200 Unstable angina: Secondary | ICD-10-CM | POA: Diagnosis not present

## 2018-04-12 DIAGNOSIS — N3000 Acute cystitis without hematuria: Secondary | ICD-10-CM | POA: Diagnosis not present

## 2018-04-12 DIAGNOSIS — N390 Urinary tract infection, site not specified: Secondary | ICD-10-CM | POA: Insufficient documentation

## 2018-04-12 MED ORDER — LOSARTAN POTASSIUM 25 MG PO TABS: 25.0000 mg | ORAL_TABLET | Freq: Every day | ORAL | 1 refills | Status: DC

## 2018-04-12 NOTE — Assessment & Plan Note (Signed)
Associated with confusion and weakness Went to ED, completed antibiotics No symptoms today No obvious confusion No further testing needed

## 2018-04-12 NOTE — Assessment & Plan Note (Signed)
BP normal, but on low side here and in ED High risk of vagovagal episodes Dec losartan to 25 mg daily Continue metoprolol at current dose Monitor bp Continue increased fluids

## 2018-04-12 NOTE — Patient Instructions (Addendum)
   Medications reviewed and updated.  Changes include decreasing your losartan from 50 mg daily to 25 mg daily.   Your prescription(s) have been submitted to your pharmacy. Please take as directed and contact our office if you believe you are having problem(s) with the medication(s).    Please followup in 3 months

## 2018-04-13 ENCOUNTER — Inpatient Hospital Stay
Admission: RE | Admit: 2018-04-13 | Discharge: 2018-04-13 | Disposition: A | Discharge status: Home / Self Care | Payer: Medicare Other | Source: Ambulatory Visit | Attending: Orthopaedic Surgery | Admitting: Orthopaedic Surgery

## 2018-04-13 DIAGNOSIS — I2510 Atherosclerotic heart disease of native coronary artery without angina pectoris: Secondary | ICD-10-CM | POA: Diagnosis not present

## 2018-04-13 DIAGNOSIS — I10 Essential (primary) hypertension: Secondary | ICD-10-CM | POA: Diagnosis not present

## 2018-04-13 DIAGNOSIS — I69320 Aphasia following cerebral infarction: Secondary | ICD-10-CM | POA: Diagnosis not present

## 2018-04-13 DIAGNOSIS — I69351 Hemiplegia and hemiparesis following cerebral infarction affecting right dominant side: Secondary | ICD-10-CM | POA: Diagnosis not present

## 2018-04-13 DIAGNOSIS — E039 Hypothyroidism, unspecified: Secondary | ICD-10-CM | POA: Diagnosis not present

## 2018-04-13 DIAGNOSIS — E1142 Type 2 diabetes mellitus with diabetic polyneuropathy: Secondary | ICD-10-CM | POA: Diagnosis not present

## 2018-04-14 ENCOUNTER — Telehealth: Payer: Self-pay | Admitting: Cardiovascular Disease

## 2018-04-14 DIAGNOSIS — I69351 Hemiplegia and hemiparesis following cerebral infarction affecting right dominant side: Secondary | ICD-10-CM | POA: Diagnosis not present

## 2018-04-14 DIAGNOSIS — I2510 Atherosclerotic heart disease of native coronary artery without angina pectoris: Secondary | ICD-10-CM | POA: Diagnosis not present

## 2018-04-14 DIAGNOSIS — I69320 Aphasia following cerebral infarction: Secondary | ICD-10-CM | POA: Diagnosis not present

## 2018-04-14 DIAGNOSIS — I10 Essential (primary) hypertension: Secondary | ICD-10-CM | POA: Diagnosis not present

## 2018-04-14 DIAGNOSIS — E039 Hypothyroidism, unspecified: Secondary | ICD-10-CM | POA: Diagnosis not present

## 2018-04-14 DIAGNOSIS — E1142 Type 2 diabetes mellitus with diabetic polyneuropathy: Secondary | ICD-10-CM | POA: Diagnosis not present

## 2018-04-14 NOTE — Telephone Encounter (Signed)
New message        [facility] Pre-operative Risk Assessment    Request for surgical clearance:  1. What type of surgery is being performed? Lumbar epidural injection  2. When is this surgery scheduled? TBD  3. What type of clearance is required (medical clearance vs. Pharmacy clearance to hold med vs. Both)?Pharmacy  4. Are there any medications that need to be held prior to surgery and how long? Plavix  5. Practice name and name of physician performing surgery?  [facility]- doctor unknown at this time  6. What is your office phone number [phone]   7.   What is your office fax number [phone]  8.   Anesthesia type (None, local, MAC, general) ? local   Laurier Nancy 04/14/2018, 2:51 PM  _________________________________________________________________   (provider comments below)

## 2018-04-18 DIAGNOSIS — I2510 Atherosclerotic heart disease of native coronary artery without angina pectoris: Secondary | ICD-10-CM | POA: Diagnosis not present

## 2018-04-18 DIAGNOSIS — I10 Essential (primary) hypertension: Secondary | ICD-10-CM | POA: Diagnosis not present

## 2018-04-18 DIAGNOSIS — I69320 Aphasia following cerebral infarction: Secondary | ICD-10-CM | POA: Diagnosis not present

## 2018-04-18 DIAGNOSIS — E1142 Type 2 diabetes mellitus with diabetic polyneuropathy: Secondary | ICD-10-CM | POA: Diagnosis not present

## 2018-04-18 DIAGNOSIS — E039 Hypothyroidism, unspecified: Secondary | ICD-10-CM | POA: Diagnosis not present

## 2018-04-18 DIAGNOSIS — I69351 Hemiplegia and hemiparesis following cerebral infarction affecting right dominant side: Secondary | ICD-10-CM | POA: Diagnosis not present

## 2018-04-18 NOTE — Telephone Encounter (Signed)
Pt at low risk of holding plavix 5 days prior to the procedure. Resume post-operatively when determined to be safe

## 2018-04-18 NOTE — Telephone Encounter (Signed)
Faxed via Epic to [employer]: [phone]

## 2018-04-19 ENCOUNTER — Telehealth: Payer: Self-pay | Admitting: Internal Medicine

## 2018-04-19 DIAGNOSIS — I10 Essential (primary) hypertension: Secondary | ICD-10-CM | POA: Diagnosis not present

## 2018-04-19 DIAGNOSIS — I69351 Hemiplegia and hemiparesis following cerebral infarction affecting right dominant side: Secondary | ICD-10-CM | POA: Diagnosis not present

## 2018-04-19 DIAGNOSIS — E782 Mixed hyperlipidemia: Secondary | ICD-10-CM

## 2018-04-19 DIAGNOSIS — E039 Hypothyroidism, unspecified: Secondary | ICD-10-CM | POA: Diagnosis not present

## 2018-04-19 DIAGNOSIS — I2510 Atherosclerotic heart disease of native coronary artery without angina pectoris: Secondary | ICD-10-CM | POA: Diagnosis not present

## 2018-04-19 DIAGNOSIS — R300 Dysuria: Secondary | ICD-10-CM

## 2018-04-19 DIAGNOSIS — I69320 Aphasia following cerebral infarction: Secondary | ICD-10-CM | POA: Diagnosis not present

## 2018-04-19 DIAGNOSIS — E1142 Type 2 diabetes mellitus with diabetic polyneuropathy: Secondary | ICD-10-CM | POA: Diagnosis not present

## 2018-04-19 DIAGNOSIS — E1165 Type 2 diabetes mellitus with hyperglycemia: Secondary | ICD-10-CM

## 2018-04-19 NOTE — Telephone Encounter (Signed)
Spoke with pts husband, states "pt eats fairly well". Advised husband to buy a scale and start mesuring pts weight daily and let us know if he see a decline in weight.   Advised pt's husband that we did send over orders for brace but it has not been scanned into pts chart. He is going to ask PT tomorrow where the brace can be sent.

## 2018-04-19 NOTE — Telephone Encounter (Signed)
Copied from [place] [phone]. Topic: Quick Communication - See Telephone Encounter >> Apr 19, 2018 12:57 PM Synthia Innocent wrote: CRM for notification. See Telephone encounter for: 04/19/18. Patient spouse calling, concerned over the weight loss in the last month. States he doesn't know how much just states a lot. Also checking status of where the ankle brace form was sent to. Please advise

## 2018-04-19 NOTE — Telephone Encounter (Signed)
Monitor weight at home  Blood work was ordered a couple of months ago - can come get that done to recheck thyroid

## 2018-04-20 ENCOUNTER — Telehealth: Payer: Self-pay | Admitting: Internal Medicine

## 2018-04-20 ENCOUNTER — Other Ambulatory Visit (INDEPENDENT_AMBULATORY_CARE_PROVIDER_SITE_OTHER): Payer: Medicare Other

## 2018-04-20 DIAGNOSIS — E1142 Type 2 diabetes mellitus with diabetic polyneuropathy: Secondary | ICD-10-CM | POA: Diagnosis not present

## 2018-04-20 DIAGNOSIS — E1165 Type 2 diabetes mellitus with hyperglycemia: Secondary | ICD-10-CM

## 2018-04-20 DIAGNOSIS — E782 Mixed hyperlipidemia: Secondary | ICD-10-CM

## 2018-04-20 DIAGNOSIS — I2510 Atherosclerotic heart disease of native coronary artery without angina pectoris: Secondary | ICD-10-CM | POA: Diagnosis not present

## 2018-04-20 DIAGNOSIS — I69320 Aphasia following cerebral infarction: Secondary | ICD-10-CM | POA: Diagnosis not present

## 2018-04-20 DIAGNOSIS — R300 Dysuria: Secondary | ICD-10-CM | POA: Diagnosis not present

## 2018-04-20 DIAGNOSIS — E039 Hypothyroidism, unspecified: Secondary | ICD-10-CM

## 2018-04-20 DIAGNOSIS — I10 Essential (primary) hypertension: Secondary | ICD-10-CM | POA: Diagnosis not present

## 2018-04-20 DIAGNOSIS — I69351 Hemiplegia and hemiparesis following cerebral infarction affecting right dominant side: Secondary | ICD-10-CM | POA: Diagnosis not present

## 2018-04-20 LAB — LIPID PANEL
CHOLESTEROL: 126 mg/dL (ref 0–200)
HDL: 30.8 mg/dL — AB (ref >39.00)
LDL CALC: 72 mg/dL (ref 0–99)
NonHDL: 95.07
Total CHOL/HDL Ratio: 4
Triglycerides: 115 mg/dL (ref 0.0–149.0)
VLDL: 23 mg/dL (ref 0.0–40.0)

## 2018-04-20 LAB — CBC WITH DIFFERENTIAL/PLATELET
BASOS PCT: 0.4 % (ref 0.0–3.0)
Basophils Absolute: 0 10*3/uL (ref 0.0–0.1)
EOS PCT: 2.5 % (ref 0.0–5.0)
Eosinophils Absolute: 0.2 10*3/uL (ref 0.0–0.7)
HCT: 36.3 % (ref 36.0–46.0)
Hemoglobin: 12 g/dL (ref 12.0–15.0)
LYMPHS ABS: 1.6 10*3/uL (ref 0.7–4.0)
Lymphocytes Relative: 21 % (ref 12.0–46.0)
MCHC: 33.1 g/dL (ref 30.0–36.0)
MCV: 79.7 fl (ref 78.0–100.0)
MONO ABS: 0.5 10*3/uL (ref 0.1–1.0)
MONOS PCT: 6.4 % (ref 3.0–12.0)
NEUTROS ABS: 5.4 10*3/uL (ref 1.4–7.7)
NEUTROS PCT: 69.7 % (ref 43.0–77.0)
Platelets: 260 10*3/uL (ref 150.0–400.0)
RBC: 4.56 Mil/uL (ref 3.87–5.11)
RDW: 16.2 % — AB (ref 11.5–15.5)
WBC: 7.7 10*3/uL (ref 4.0–10.5)

## 2018-04-20 LAB — URINALYSIS, ROUTINE W REFLEX MICROSCOPIC
BILIRUBIN URINE: NEGATIVE
KETONES UR: NEGATIVE
NITRITE: NEGATIVE
PH: 5.5 (ref 5.0–8.0)
Specific Gravity, Urine: 1.02 (ref 1.000–1.030)
Total Protein, Urine: NEGATIVE
Urine Glucose: NEGATIVE
Urobilinogen, UA: 0.2 (ref 0.0–1.0)

## 2018-04-20 LAB — COMPREHENSIVE METABOLIC PANEL
ALBUMIN: 3.8 g/dL (ref 3.5–5.2)
ALK PHOS: 67 U/L (ref 39–117)
ALT: 13 U/L (ref 0–35)
AST: 13 U/L (ref 0–37)
BUN: 26 mg/dL — AB (ref 6–23)
CHLORIDE: 106 meq/L (ref 96–112)
CO2: 24 mEq/L (ref 19–32)
Calcium: 9 mg/dL (ref 8.4–10.5)
Creatinine, Ser: 1.25 mg/dL — ABNORMAL HIGH (ref 0.40–1.20)
GFR: 43.14 mL/min — ABNORMAL LOW (ref >60.00)
GLUCOSE: 165 mg/dL — AB (ref 70–99)
POTASSIUM: 4.7 meq/L (ref 3.5–5.1)
SODIUM: 136 meq/L (ref 135–145)
Total Bilirubin: 0.9 mg/dL (ref 0.2–1.2)
Total Protein: 6.5 g/dL (ref 6.0–8.3)

## 2018-04-20 LAB — HEMOGLOBIN A1C: Hgb A1c MFr Bld: 6.3 % (ref 4.6–6.5)

## 2018-04-20 LAB — TSH: TSH: 0.01 u[IU]/mL — AB (ref 0.35–4.50)

## 2018-04-20 NOTE — Telephone Encounter (Signed)
Spoke with pts husband. He will bring her in for blood work as soon as he can. Do you just want Thyroid checked?

## 2018-04-20 NOTE — Telephone Encounter (Signed)
Orders place. UA and UC placed as well due to pt feeling that she has a UTI. Appt was scheduled for Monday at 230 pm. Will cancel if needed.

## 2018-04-20 NOTE — Telephone Encounter (Signed)
Spoke with pts husband. Orders placed for UA and UC

## 2018-04-20 NOTE — Telephone Encounter (Signed)
Copied from [place] [phone]. Topic: General - Other >> Apr 20, 2018 12:30 PM Yvette Rack wrote: Reason for CRM: Lauren with [facility] requested to speak with a nurse. Lauren states the pt has had an increase in urine, low BP 102/54, and pt is concerned that she may have a UTI. Lauren request that the nurse call the pt to advise. Please call pt at home #.

## 2018-04-20 NOTE — Telephone Encounter (Signed)
Lets check everything that was ordered in march - lipid, cmp, a1c and cbc in addition to tsh

## 2018-04-20 NOTE — Telephone Encounter (Signed)
noted 

## 2018-04-21 ENCOUNTER — Other Ambulatory Visit: Payer: Self-pay | Admitting: Internal Medicine

## 2018-04-21 DIAGNOSIS — I69351 Hemiplegia and hemiparesis following cerebral infarction affecting right dominant side: Secondary | ICD-10-CM | POA: Diagnosis not present

## 2018-04-21 DIAGNOSIS — E1142 Type 2 diabetes mellitus with diabetic polyneuropathy: Secondary | ICD-10-CM | POA: Diagnosis not present

## 2018-04-21 DIAGNOSIS — I2510 Atherosclerotic heart disease of native coronary artery without angina pectoris: Secondary | ICD-10-CM | POA: Diagnosis not present

## 2018-04-21 DIAGNOSIS — E039 Hypothyroidism, unspecified: Secondary | ICD-10-CM | POA: Diagnosis not present

## 2018-04-21 DIAGNOSIS — I10 Essential (primary) hypertension: Secondary | ICD-10-CM | POA: Diagnosis not present

## 2018-04-21 DIAGNOSIS — I69320 Aphasia following cerebral infarction: Secondary | ICD-10-CM | POA: Diagnosis not present

## 2018-04-21 MED ORDER — LEVOTHYROXINE SODIUM 125 MCG PO TABS: 125.0000 ug | ORAL_TABLET | Freq: Every day | ORAL | 0 refills | Status: DC

## 2018-04-21 MED ORDER — CEPHALEXIN 500 MG PO CAPS: 500.0000 mg | ORAL_CAPSULE | Freq: Two times a day (BID) | ORAL | 0 refills | Status: DC

## 2018-04-21 NOTE — Telephone Encounter (Signed)
Preliminary urine tests shows probable infection -- antibiotic pending.

## 2018-04-21 NOTE — Telephone Encounter (Signed)
Spoke with pts husband, RX sent to [facility]

## 2018-04-22 LAB — URINE CULTURE
MICRO NUMBER:: 90598065
SPECIMEN QUALITY:: ADEQUATE

## 2018-04-24 ENCOUNTER — Ambulatory Visit: Payer: Medicare Other | Admitting: Internal Medicine

## 2018-04-24 ENCOUNTER — Telehealth: Payer: Self-pay | Admitting: Internal Medicine

## 2018-04-24 NOTE — Telephone Encounter (Signed)
Copied from [place] [phone]. Topic: General - Other >> Apr 20, 2018 12:30 PM Yvette Rack wrote: Reason for CRM: Lauren with [facility] requested to speak with a nurse. Lauren states the pt has had an increase in urine, low BP 102/54, and pt is concerned that she may have a UTI. Lauren request that the nurse call the pt to advise. Please call pt at home #.   >> Apr 24, 2018  9:52 AM Oneta Rack wrote: Osvaldo Human name: Izora Gala  Relation to pt: OT from [facility]  Call back number: [phone]    Reason for call:  Requesting verbal orders for OT 2x 4 for coordination, strength, balancing and ADL, please advise

## 2018-04-24 NOTE — Telephone Encounter (Signed)
LVM with Izora Gala, OT giving verbal orders per MD.

## 2018-04-25 DIAGNOSIS — I2510 Atherosclerotic heart disease of native coronary artery without angina pectoris: Secondary | ICD-10-CM | POA: Diagnosis not present

## 2018-04-25 DIAGNOSIS — E039 Hypothyroidism, unspecified: Secondary | ICD-10-CM | POA: Diagnosis not present

## 2018-04-25 DIAGNOSIS — I10 Essential (primary) hypertension: Secondary | ICD-10-CM | POA: Diagnosis not present

## 2018-04-25 DIAGNOSIS — I69320 Aphasia following cerebral infarction: Secondary | ICD-10-CM | POA: Diagnosis not present

## 2018-04-25 DIAGNOSIS — I69351 Hemiplegia and hemiparesis following cerebral infarction affecting right dominant side: Secondary | ICD-10-CM | POA: Diagnosis not present

## 2018-04-25 DIAGNOSIS — E1142 Type 2 diabetes mellitus with diabetic polyneuropathy: Secondary | ICD-10-CM | POA: Diagnosis not present

## 2018-04-27 ENCOUNTER — Telehealth: Payer: Self-pay | Admitting: Emergency Medicine

## 2018-04-27 DIAGNOSIS — Z8673 Personal history of transient ischemic attack (TIA), and cerebral infarction without residual deficits: Secondary | ICD-10-CM

## 2018-04-27 NOTE — Telephone Encounter (Signed)
Copied from [place] [phone]. Topic: General - Other >> Apr 21, 2018  2:43 PM Yvette Rack wrote: Reason for CRM: Pt husband [name] requested that a fax be sent to [employer] pertaining to pt ankle foot brace.

## 2018-04-27 NOTE — Telephone Encounter (Signed)
Spoke with [facility], Order, OV notes, and Demographics faxed to [phone]

## 2018-05-02 ENCOUNTER — Ambulatory Visit: Payer: Medicare Other | Admitting: Internal Medicine

## 2018-05-02 DIAGNOSIS — E039 Hypothyroidism, unspecified: Secondary | ICD-10-CM | POA: Diagnosis not present

## 2018-05-02 DIAGNOSIS — I2510 Atherosclerotic heart disease of native coronary artery without angina pectoris: Secondary | ICD-10-CM | POA: Diagnosis not present

## 2018-05-02 DIAGNOSIS — E1142 Type 2 diabetes mellitus with diabetic polyneuropathy: Secondary | ICD-10-CM | POA: Diagnosis not present

## 2018-05-02 DIAGNOSIS — I69320 Aphasia following cerebral infarction: Secondary | ICD-10-CM | POA: Diagnosis not present

## 2018-05-02 DIAGNOSIS — I10 Essential (primary) hypertension: Secondary | ICD-10-CM | POA: Diagnosis not present

## 2018-05-02 DIAGNOSIS — I69351 Hemiplegia and hemiparesis following cerebral infarction affecting right dominant side: Secondary | ICD-10-CM | POA: Diagnosis not present

## 2018-05-04 ENCOUNTER — Other Ambulatory Visit: Payer: Medicare Other

## 2018-05-04 ENCOUNTER — Telehealth: Payer: Self-pay | Admitting: Internal Medicine

## 2018-05-04 ENCOUNTER — Telehealth: Payer: Self-pay | Admitting: Emergency Medicine

## 2018-05-04 DIAGNOSIS — I69351 Hemiplegia and hemiparesis following cerebral infarction affecting right dominant side: Secondary | ICD-10-CM | POA: Diagnosis not present

## 2018-05-04 DIAGNOSIS — R300 Dysuria: Secondary | ICD-10-CM

## 2018-05-04 DIAGNOSIS — I10 Essential (primary) hypertension: Secondary | ICD-10-CM | POA: Diagnosis not present

## 2018-05-04 DIAGNOSIS — I2510 Atherosclerotic heart disease of native coronary artery without angina pectoris: Secondary | ICD-10-CM | POA: Diagnosis not present

## 2018-05-04 DIAGNOSIS — E039 Hypothyroidism, unspecified: Secondary | ICD-10-CM | POA: Diagnosis not present

## 2018-05-04 DIAGNOSIS — E1142 Type 2 diabetes mellitus with diabetic polyneuropathy: Secondary | ICD-10-CM | POA: Diagnosis not present

## 2018-05-04 DIAGNOSIS — I69320 Aphasia following cerebral infarction: Secondary | ICD-10-CM | POA: Diagnosis not present

## 2018-05-04 NOTE — Telephone Encounter (Signed)
Should submit a urinalysis and urine culture

## 2018-05-04 NOTE — Telephone Encounter (Signed)
Spoke with husband. Advised orders were placed and to bring pt to lab for sample.

## 2018-05-04 NOTE — Telephone Encounter (Signed)
Copied from [place] [id number]. Topic: Inquiry >> May 04, 2018  9:48 AM Scherrie Gerlach wrote: Reason for CRM: husband called to advise pt is having frequent urination during the night.  He wanted the dr to know.  Asked husband if he would like to make an appt for pt to be seen, he advised it would be up to the dr. But he wanted Janet Steele to know and see what yo want to do. Husband very adamant he preferred to ask the dr before doing anything else.

## 2018-05-04 NOTE — Telephone Encounter (Signed)
Spoke with Katelyn from [place], advised paperwork has already been faxed over. Faxed again today.

## 2018-05-04 NOTE — Telephone Encounter (Signed)
Copied from [place] [phone]. Topic: Referral - Status >> May 04, 2018 10:15 AM Conception Chancy, NT wrote: Reason for CRM: Janet Steele is calling from [facility] and is calling to follow up on referral for Janet Steele in [place]. Fax # [phone]  Katelyn Cb# [phone]

## 2018-05-04 NOTE — Telephone Encounter (Signed)
Looks like everything was faxed to 5/23.  Not sure if they need anything else

## 2018-05-04 NOTE — Telephone Encounter (Signed)
Copied from [place] [id number]. Topic: Quick Communication - See Telephone Encounter >> May 04, 2018  2:12 PM Boyd Kerbs wrote: CRM for notification. See Telephone encounter for: 05/04/18.  Corbin Ade from [facility] [phone] needing verbal orders PT 3 x 4;  2 x 5

## 2018-05-04 NOTE — Telephone Encounter (Addendum)
Spoke with Corbin Ade to give verbal orders for PT per MD

## 2018-05-05 ENCOUNTER — Other Ambulatory Visit (INDEPENDENT_AMBULATORY_CARE_PROVIDER_SITE_OTHER): Payer: Medicare Other

## 2018-05-05 ENCOUNTER — Other Ambulatory Visit: Payer: Self-pay | Admitting: Emergency Medicine

## 2018-05-05 DIAGNOSIS — R300 Dysuria: Secondary | ICD-10-CM

## 2018-05-05 LAB — URINALYSIS, ROUTINE W REFLEX MICROSCOPIC
BILIRUBIN URINE: NEGATIVE
Ketones, ur: NEGATIVE
Nitrite: NEGATIVE
PH: 6 (ref 5.0–8.0)
SPECIFIC GRAVITY, URINE: 1.01 (ref 1.000–1.030)
Urine Glucose: NEGATIVE
Urobilinogen, UA: 0.2 (ref 0.0–1.0)

## 2018-05-05 MED ORDER — CIPROFLOXACIN HCL 250 MG PO TABS: 250.0000 mg | ORAL_TABLET | Freq: Two times a day (BID) | ORAL | 0 refills | Status: DC

## 2018-05-06 LAB — URINE CULTURE
MICRO NUMBER: 90657649
SPECIMEN QUALITY: ADEQUATE

## 2018-05-07 ENCOUNTER — Other Ambulatory Visit: Payer: Self-pay | Admitting: Internal Medicine

## 2018-05-07 DIAGNOSIS — I10 Essential (primary) hypertension: Secondary | ICD-10-CM | POA: Diagnosis not present

## 2018-05-07 DIAGNOSIS — I69320 Aphasia following cerebral infarction: Secondary | ICD-10-CM | POA: Diagnosis not present

## 2018-05-07 DIAGNOSIS — I2510 Atherosclerotic heart disease of native coronary artery without angina pectoris: Secondary | ICD-10-CM | POA: Diagnosis not present

## 2018-05-07 DIAGNOSIS — I69351 Hemiplegia and hemiparesis following cerebral infarction affecting right dominant side: Secondary | ICD-10-CM | POA: Diagnosis not present

## 2018-05-07 DIAGNOSIS — E039 Hypothyroidism, unspecified: Secondary | ICD-10-CM | POA: Diagnosis not present

## 2018-05-07 DIAGNOSIS — E1142 Type 2 diabetes mellitus with diabetic polyneuropathy: Secondary | ICD-10-CM | POA: Diagnosis not present

## 2018-05-07 DIAGNOSIS — N390 Urinary tract infection, site not specified: Secondary | ICD-10-CM

## 2018-05-09 DIAGNOSIS — E039 Hypothyroidism, unspecified: Secondary | ICD-10-CM | POA: Diagnosis not present

## 2018-05-09 DIAGNOSIS — I2510 Atherosclerotic heart disease of native coronary artery without angina pectoris: Secondary | ICD-10-CM | POA: Diagnosis not present

## 2018-05-09 DIAGNOSIS — I69320 Aphasia following cerebral infarction: Secondary | ICD-10-CM | POA: Diagnosis not present

## 2018-05-09 DIAGNOSIS — I10 Essential (primary) hypertension: Secondary | ICD-10-CM | POA: Diagnosis not present

## 2018-05-09 DIAGNOSIS — I69351 Hemiplegia and hemiparesis following cerebral infarction affecting right dominant side: Secondary | ICD-10-CM | POA: Diagnosis not present

## 2018-05-09 DIAGNOSIS — E1142 Type 2 diabetes mellitus with diabetic polyneuropathy: Secondary | ICD-10-CM | POA: Diagnosis not present

## 2018-05-10 ENCOUNTER — Telehealth: Payer: Self-pay | Admitting: Internal Medicine

## 2018-05-10 NOTE — Telephone Encounter (Signed)
If it started after she started the antibiotic - she should stop the antibiotic.  If diarrhea persists we need to do a stool sample.    If diarrhea is long standing it may be a different medication - but she did not complain of this at her last visit

## 2018-05-10 NOTE — Telephone Encounter (Addendum)
Copied from [place] [phone]. Topic: Quick Communication - See Telephone Encounter >> May 10, 2018 10:54 AM Ivar Drape wrote: CRM for notification. See Telephone encounter for: 05/10/18. Patient son, Kameka, Whan. [phone] needs a call back because his mother is experiencing bad diarrhea, and he thinks it's from one of her medications. Patient was not with the son when he called.

## 2018-05-11 ENCOUNTER — Ambulatory Visit
Admission: RE | Admit: 2018-05-11 | Discharge: 2018-05-11 | Disposition: A | Discharge status: Home / Self Care | Payer: Medicare Other | Source: Ambulatory Visit | Attending: Orthopaedic Surgery | Admitting: Orthopaedic Surgery

## 2018-05-11 DIAGNOSIS — G8929 Other chronic pain: Secondary | ICD-10-CM

## 2018-05-11 DIAGNOSIS — M48061 Spinal stenosis, lumbar region without neurogenic claudication: Secondary | ICD-10-CM | POA: Diagnosis not present

## 2018-05-11 DIAGNOSIS — M545 Low back pain: Principal | ICD-10-CM

## 2018-05-11 MED ORDER — METHYLPREDNISOLONE ACETATE 40 MG/ML INJ SUSP (RADIOLOG: 120.0000 mg | Freq: Once | INTRAMUSCULAR | Status: DC

## 2018-05-11 MED ORDER — IOPAMIDOL (ISOVUE-M 200) INJECTION 41%: 1.0000 mL | Freq: Once | INTRAMUSCULAR | Status: DC

## 2018-05-11 NOTE — Discharge Instructions (Signed)
Post Procedure Spinal Discharge Instruction Sheet  1. You may resume a regular diet and any medications that you routinely take (including pain medications).  2. No driving day of procedure.  3. Light activity throughout the rest of the day.  Do not do any strenuous work, exercise, bending or lifting.  The day following the procedure, you can resume normal physical activity but you should refrain from exercising or physical therapy for at least three days thereafter.   Common Side Effects:   Headaches- take your usual medications as directed by your physician.  Increase your fluid intake.  Caffeinated beverages may be helpful.  Lie flat in bed until your headache resolves.   Restlessness or inability to sleep- you may have trouble sleeping for the next few days.  Ask your referring physician if you need any medication for sleep.   Facial flushing or redness- should subside within a few days.   Increased pain- a temporary increase in pain a day or two following your procedure is not unusual.  Take your pain medication as prescribed by your referring physician.   Leg cramps  Please contact our office at [phone] for the following symptoms:  Fever greater than 100 degrees.  Headaches unresolved with medication after 2-3 days.  Increased swelling, pain, or redness at injection site.  YOU MAY RESTART YOUR PLAVIX TODAY. 

## 2018-05-11 NOTE — Telephone Encounter (Signed)
Spoke with pts son. States the diarrhea has been much worse while on the antibiotic. Antibiotic was for 5 days. Pt should have completed by now or just about done. If not done advised to stop. Husband is also giving imodium and that seems to be helping. Advised to contact us next week if things have not gotten any better.

## 2018-05-12 DIAGNOSIS — E039 Hypothyroidism, unspecified: Secondary | ICD-10-CM | POA: Diagnosis not present

## 2018-05-12 DIAGNOSIS — I2510 Atherosclerotic heart disease of native coronary artery without angina pectoris: Secondary | ICD-10-CM | POA: Diagnosis not present

## 2018-05-12 DIAGNOSIS — I10 Essential (primary) hypertension: Secondary | ICD-10-CM | POA: Diagnosis not present

## 2018-05-12 DIAGNOSIS — E1142 Type 2 diabetes mellitus with diabetic polyneuropathy: Secondary | ICD-10-CM | POA: Diagnosis not present

## 2018-05-12 DIAGNOSIS — I69351 Hemiplegia and hemiparesis following cerebral infarction affecting right dominant side: Secondary | ICD-10-CM | POA: Diagnosis not present

## 2018-05-12 DIAGNOSIS — I69320 Aphasia following cerebral infarction: Secondary | ICD-10-CM | POA: Diagnosis not present

## 2018-05-15 ENCOUNTER — Telehealth: Payer: Self-pay | Admitting: Internal Medicine

## 2018-05-15 NOTE — Telephone Encounter (Signed)
Copied from [place] [phone]. Topic: Quick Communication - See Telephone Encounter >> May 15, 2018 10:19 AM Rutherford Nail, NT wrote: CRM for notification. See Telephone encounter for: 05/15/18. Patient's husband, Rush Landmark, calling and is requesting a call back from [place]. States that there was some confusion with [facility]. Would like a call to discuss. CB#: [phone]

## 2018-05-16 DIAGNOSIS — I10 Essential (primary) hypertension: Secondary | ICD-10-CM | POA: Diagnosis not present

## 2018-05-16 DIAGNOSIS — E1142 Type 2 diabetes mellitus with diabetic polyneuropathy: Secondary | ICD-10-CM | POA: Diagnosis not present

## 2018-05-16 DIAGNOSIS — I2510 Atherosclerotic heart disease of native coronary artery without angina pectoris: Secondary | ICD-10-CM | POA: Diagnosis not present

## 2018-05-16 DIAGNOSIS — E039 Hypothyroidism, unspecified: Secondary | ICD-10-CM | POA: Diagnosis not present

## 2018-05-16 DIAGNOSIS — I69320 Aphasia following cerebral infarction: Secondary | ICD-10-CM | POA: Diagnosis not present

## 2018-05-16 DIAGNOSIS — I69351 Hemiplegia and hemiparesis following cerebral infarction affecting right dominant side: Secondary | ICD-10-CM | POA: Diagnosis not present

## 2018-05-16 NOTE — Telephone Encounter (Signed)
Spoke with pt's husband to inform.  

## 2018-05-17 ENCOUNTER — Other Ambulatory Visit: Payer: Self-pay | Admitting: Internal Medicine

## 2018-05-19 ENCOUNTER — Telehealth: Payer: Self-pay | Admitting: Emergency Medicine

## 2018-05-19 NOTE — Telephone Encounter (Signed)
Orders have been faxed

## 2018-05-19 NOTE — Telephone Encounter (Signed)
Copied from [place] [phone]. Topic: General - Other >> May 19, 2018 10:46 AM Margot Ables wrote: Reason for CRM: faxing form for letter of medical necessity for orthotics to [phone]. Please f/u when received.

## 2018-05-23 DIAGNOSIS — I10 Essential (primary) hypertension: Secondary | ICD-10-CM | POA: Diagnosis not present

## 2018-05-23 DIAGNOSIS — I69320 Aphasia following cerebral infarction: Secondary | ICD-10-CM | POA: Diagnosis not present

## 2018-05-23 DIAGNOSIS — E1142 Type 2 diabetes mellitus with diabetic polyneuropathy: Secondary | ICD-10-CM | POA: Diagnosis not present

## 2018-05-23 DIAGNOSIS — I69351 Hemiplegia and hemiparesis following cerebral infarction affecting right dominant side: Secondary | ICD-10-CM | POA: Diagnosis not present

## 2018-05-23 DIAGNOSIS — I2510 Atherosclerotic heart disease of native coronary artery without angina pectoris: Secondary | ICD-10-CM | POA: Diagnosis not present

## 2018-05-23 DIAGNOSIS — E039 Hypothyroidism, unspecified: Secondary | ICD-10-CM | POA: Diagnosis not present

## 2018-05-25 ENCOUNTER — Other Ambulatory Visit: Payer: Self-pay | Admitting: Internal Medicine

## 2018-05-25 DIAGNOSIS — I69351 Hemiplegia and hemiparesis following cerebral infarction affecting right dominant side: Secondary | ICD-10-CM | POA: Diagnosis not present

## 2018-05-25 DIAGNOSIS — I2510 Atherosclerotic heart disease of native coronary artery without angina pectoris: Secondary | ICD-10-CM | POA: Diagnosis not present

## 2018-05-25 DIAGNOSIS — E039 Hypothyroidism, unspecified: Secondary | ICD-10-CM | POA: Diagnosis not present

## 2018-05-25 DIAGNOSIS — I10 Essential (primary) hypertension: Secondary | ICD-10-CM | POA: Diagnosis not present

## 2018-05-25 DIAGNOSIS — I69320 Aphasia following cerebral infarction: Secondary | ICD-10-CM | POA: Diagnosis not present

## 2018-05-25 DIAGNOSIS — E1142 Type 2 diabetes mellitus with diabetic polyneuropathy: Secondary | ICD-10-CM | POA: Diagnosis not present

## 2018-06-01 DIAGNOSIS — I69351 Hemiplegia and hemiparesis following cerebral infarction affecting right dominant side: Secondary | ICD-10-CM | POA: Diagnosis not present

## 2018-06-01 DIAGNOSIS — E1142 Type 2 diabetes mellitus with diabetic polyneuropathy: Secondary | ICD-10-CM | POA: Diagnosis not present

## 2018-06-01 DIAGNOSIS — I2510 Atherosclerotic heart disease of native coronary artery without angina pectoris: Secondary | ICD-10-CM | POA: Diagnosis not present

## 2018-06-01 DIAGNOSIS — I69320 Aphasia following cerebral infarction: Secondary | ICD-10-CM | POA: Diagnosis not present

## 2018-06-01 DIAGNOSIS — E039 Hypothyroidism, unspecified: Secondary | ICD-10-CM | POA: Diagnosis not present

## 2018-06-01 DIAGNOSIS — I10 Essential (primary) hypertension: Secondary | ICD-10-CM | POA: Diagnosis not present

## 2018-06-05 ENCOUNTER — Encounter: Payer: Self-pay | Admitting: Internal Medicine

## 2018-06-05 ENCOUNTER — Ambulatory Visit (INDEPENDENT_AMBULATORY_CARE_PROVIDER_SITE_OTHER): Payer: Medicare Other | Admitting: Internal Medicine

## 2018-06-05 VITALS — BP 110/60 | HR 95 | Temp 97.7°F | Resp 16 | Wt 97.0 lb

## 2018-06-05 DIAGNOSIS — S51819A Laceration without foreign body of unspecified forearm, initial encounter: Secondary | ICD-10-CM

## 2018-06-05 DIAGNOSIS — Z23 Encounter for immunization: Secondary | ICD-10-CM | POA: Diagnosis not present

## 2018-06-05 DIAGNOSIS — I200 Unstable angina: Secondary | ICD-10-CM | POA: Diagnosis not present

## 2018-06-05 DIAGNOSIS — R634 Abnormal weight loss: Secondary | ICD-10-CM

## 2018-06-05 MED ORDER — MUPIROCIN 2 % EX OINT: TOPICAL_OINTMENT | Freq: Two times a day (BID) | CUTANEOUS | 0 refills | Status: DC

## 2018-06-05 NOTE — Patient Instructions (Signed)
  A tetanus vaccine was administered today.   Continue applying an anti-bacterial ointment and keep it bandaged.   Bactroban - an anti-bacterial ointment was sent to your pharmacy.

## 2018-06-05 NOTE — Assessment & Plan Note (Signed)
Due to fall 1 week ago-lost her balance Skin tears to right forearm without other injuries No evidence of infection Tdap today-she is due Continue wound care with antibacterial ointment-Bactroban sent to pharmacy, nonstick pads and keeping it wrapped Her husband will monitor for infection Discussed fall prevention

## 2018-06-05 NOTE — Progress Notes (Signed)
Subjective:    Patient ID: Janet Steele, female    DOB: 1929/05/25, 84 y.o.   MRN: [id number]  HPI The patient is here for an acute visit.  Her husband is with her.   Fell, skin tear on right tear:  She fell one week ago and sustained a skin tear on her right forearm.  There were no other injuries.  She denies head trauma or loss of consciousness.  Her husband believes she just lost her balance-she was using her walker.  Her husband has been putting neosporin on the arm and has kept it wrapped.  She and her husband deny fever or chills.  She denies any numbness or tingling in the arm.  They wanted to make sure it looked ok and there was nothing more they should do.   Weight loss: She continues to lose weight.  She states she is eating pretty well, but her husband states she is eating less.  She only ate one quarter of what she would typically eat last night for dinner.  She just does not have the appetite.  There are son plans on buying some Ensure or boost for her to try.   Medications and allergies reviewed with patient and updated if appropriate.  Patient Active Problem List   Diagnosis Date Noted  . Skin tear of forearm without complication, initial encounter 06/05/2018  . Weight loss 06/05/2018  . UTI (urinary tract infection) 04/12/2018  . Stroke ([place]) 11/04/2017  . Weakness 11/03/2017  . History of cerebrovascular accident (CVA) with residual deficit 11/03/2017  . Chronic back pain 07/04/2017  . PAD (peripheral artery disease) ([place]) 01/17/2017  . Overactive bladder 12/17/2016  . Anemia 05/20/2016  . Coronary artery disease involving coronary bypass graft of native heart without angina pectoris 05/19/2016  . Numbness and tingling of foot 12/31/2015  . MCI (mild cognitive impairment) with memory loss 11/18/2015  . Facial tic 05/07/2015  . Facial nerve spasm 05/07/2015  . Sick sinus syndrome ([place]) 02/26/2015  . Cervical dystonia 12/04/2014  . Chronic motor tic 09/12/2014    . Unstable angina ([place]) 03/02/2014  . Pacemaker-Medtronic 07/07/2012  . GERD (gastroesophageal reflux disease) 07/05/2012  . Abnormal involuntary movement 12/17/2009  . Disturbance in sleep behavior 12/11/2009  . CAD, ARTERY BYPASS GRAFT 09/24/2009  . CAROTID BRUIT 09/24/2009  . Diabetes ([place]) 01/30/2009  . DYSPHAGIA 06/19/2008  . Anxiety 02/14/2008  . EROSIVE ESOPHAGITIS 02/14/2008  . CONSTIPATION, CHRONIC 02/14/2008  . DEGENERATIVE JOINT DISEASE 02/14/2008  . HEARING LOSS 01/04/2008  . PVD 01/04/2008  . Hypothyroidism 09/25/2007  . HYPERLIPIDEMIA 09/25/2007  . Essential hypertension 09/07/2007  . Diabetic polyneuropathy ([place]) 03/16/2007  . Stricture and stenosis of esophagus 03/16/2007  . HIATAL HERNIA 02/16/2007  . HEMORRHOIDS 12/31/1997  . DIVERTICULOSIS, COLON 12/31/1997    Current Outpatient Medications on File Prior to Visit  Medication Sig Dispense Refill  . acetaminophen (TYLENOL) 500 MG tablet Take 2 tablets (1,000 mg total) by mouth every 6 (six) hours as needed. 30 tablet 0  . atorvastatin (LIPITOR) 80 MG tablet Take 1 tablet (80 mg total) by mouth daily. 90 tablet 1  . clopidogrel (PLAVIX) 75 MG tablet TAKE 1 TABLET BY MOUTH DAILY 30 tablet 0  . DULoxetine (CYMBALTA) 60 MG capsule Take 1 capsule (60 mg total) by mouth daily. 90 capsule 1  . levothyroxine (SYNTHROID, LEVOTHROID) 125 MCG tablet Take 1 tablet (125 mcg total) by mouth daily. 90 tablet 0  . losartan (COZAAR) 25 MG tablet  Take 1 tablet (25 mg total) by mouth daily. 90 tablet 1  . metFORMIN (GLUCOPHAGE) 1000 MG tablet Take 1 tablet (1,000 mg total) by mouth 2 (two) times daily with a meal. 180 tablet 1  . metoprolol tartrate (LOPRESSOR) 25 MG tablet TAKE 1 TABLET BY MOUTH TWICE DAILY 60 tablet 0  . nitroGLYCERIN (NITROSTAT) 0.4 MG SL tablet Place 1 tablet (0.4 mg total) under the tongue every 5 (five) minutes as needed. For chest pain 25 tablet 5  . pantoprazole (PROTONIX) 40 MG tablet Take 1 tablet (40 mg  total) by mouth 2 (two) times daily. 180 tablet 1  . RA ASPIRIN EC 81 MG EC tablet take 1 tablet by mouth once daily . OVERDUE FOR FOLLOW UP. PLEASE CALL AND SCHEDULE [phone]. 15 tablet 0  . sitaGLIPtin (JANUVIA) 50 MG tablet Take 1 tablet (50 mg total) by mouth daily. 90 tablet 1  . traZODone (DESYREL) 50 MG tablet Take 2 tablets (100 mg total) by mouth at bedtime. 180 tablet 0   No current facility-administered medications on file prior to visit.     Past Medical History:  Diagnosis Date  . Anemia   . Anxiety   . Arthritis    "fingers" (05/19/2016  . CAD (coronary artery disease)    a. s/p CABG 1982 b. redo CABG 2003. c. [place] despite med rx 05/2016: successful but complicated PCI of PDA beyond graft site, c/b localized dissection requiring overlapping stent.  . Carotid bruit    a. Duplex 01/2015: patent vessels, 1-39% BICA, f/u PRN recommened.  . Colon polyp    adenomatous  . Colon, diverticulosis   . Degenerative joint disease   . Diastolic dysfunction   . Esophageal stricture   . GERD (gastroesophageal reflux disease)   . Hiatal hernia   . History of blood transfusion    "related to my bypass"  . Hyperlipidemia   . Hypertension   . Hypothyroidism   . Ischemic cardiomyopathy    a. Cath 05/2016: EF 40% with severe inferior wall HK, suspect hibernating myocardium.  . Myocardial infarction ([place]) 1977; 1982  . Osteoarthrosis, unspecified whether generalized or localized, unspecified site   . Presence of permanent cardiac pacemaker   . PVD (peripheral vascular disease) ([place])   . Type II diabetes mellitus ([place])   . Unspecified hearing loss     Past Surgical History:  Procedure Laterality Date  . ABDOMINAL HYSTERECTOMY    . APPENDECTOMY    . BACK SURGERY    . CARDIAC CATHETERIZATION N/A 05/19/2016   Procedure: Left Heart Cath and Cors/Grafts Angiography;  Surgeon: Belva Crome, MD;  Location: [place] [facility] CV LAB;  Service: Cardiovascular;  Laterality: N/A;  . CARDIAC  CATHETERIZATION N/A 05/19/2016   Procedure: Coronary Stent Intervention;  Surgeon: Belva Crome, MD;  Location: [place] CV LAB;  Service: Cardiovascular;  Laterality: N/A;  . CATARACT EXTRACTION W/ INTRAOCULAR LENS  IMPLANT, BILATERAL Bilateral   . CHOLECYSTECTOMY OPEN    . COLONOSCOPY    . CORONARY ANGIOPLASTY WITH STENT PLACEMENT  05/19/2016   "2 stents?"  . CORONARY ARTERY BYPASS GRAFT  1982; 2003   x4(1982),02-2002 CABG X2  . ESOPHAGOGASTRODUODENOSCOPY (EGD) WITH ESOPHAGEAL DILATION  "2-3 times"  . INSERT / REPLACE / REMOVE PACEMAKER    . LEFT HEART CATHETERIZATION WITH CORONARY/GRAFT ANGIOGRAM N/A 03/05/2014   Procedure: LEFT HEART CATHETERIZATION WITH Beatrix Fetters;  Surgeon: Sinclair Grooms, MD;  Location: [facility] CATH LAB;  Service: Cardiovascular;  Laterality: N/A;  .  [place] SURGERY  01-2000  . OOPHORECTOMY Bilateral   . PERMANENT PACEMAKER INSERTION N/A 07/06/2012   MDT Adapta L implanted by Dr Rayann Heman for symptomatic bradycardia  . TONSILLECTOMY  1930s    Social History   Socioeconomic History  . Marital status: Married    Spouse name: [name]  . Number of children: 3  . Years of education: 50  . Highest education level: Not on file  Occupational History  . Occupation: Retired     Fish farm manager: RETIRED  Social Needs  . Financial resource strain: Not on file  . Food insecurity:    Worry: Not on file    Inability: Not on file  . Transportation needs:    Medical: Not on file    Non-medical: Not on file  Tobacco Use  . Smoking status: Former Smoker    Packs/day: 0.50    Years: 30.00    Pack years: 15.00    Types: Cigarettes    Last attempt to quit: 07/05/1976    Years since quitting: 41.9  . Smokeless tobacco: Never Used  Substance and Sexual Activity  . Alcohol use: No    Alcohol/week: 1.8 oz    Types: 3 Glasses of wine per week    Frequency: Never    Comment: "not anymore, I haven't in I don't know years"  . Drug use: No  . Sexual activity: Never    Lifestyle  . Physical activity:    Days per week: Not on file    Minutes per session: Not on file  . Stress: Not on file  Relationships  . Social connections:    Talks on phone: Not on file    Gets together: Not on file    Attends religious service: Not on file    Active member of club or organization: Not on file    Attends meetings of clubs or organizations: Not on file    Relationship status: Not on file  Other Topics Concern  . Not on file  Social History Narrative   Patient is married [name]) and lives at home with her husband.   Patient has three adult children.   Patient is retired.   Patient has a college education.   Patient is right-handed.   Patient does not drink any caffeine.    Family History  Problem Relation Age of Onset  . Heart disease Sister   . Heart attack Sister   . Colon cancer Neg Hx   . Breast cancer Neg Hx   . Celiac disease Neg Hx   . Cirrhosis Neg Hx   . Clotting disorder Neg Hx   . Colitis Neg Hx   . Colon polyps Neg Hx   . Crohn's disease Neg Hx   . Cystic fibrosis Neg Hx   . Diabetes Neg Hx   . Esophageal cancer Neg Hx   . Hemochromatosis Neg Hx   . Inflammatory bowel disease Neg Hx   . Irritable bowel syndrome Neg Hx   . Kidney disease Neg Hx   . Liver cancer Neg Hx   . Liver disease Neg Hx   . Ovarian cancer Neg Hx   . Pancreatic cancer Neg Hx   . Prostate cancer Neg Hx   . Rectal cancer Neg Hx   . Stomach cancer Neg Hx   . Ulcerative colitis Neg Hx   . Uterine cancer Neg Hx   . Wilson's disease Neg Hx     Review of Systems  Constitutional: Negative for chills  and fever.  Cardiovascular: Negative for leg swelling.  Skin: Positive for wound. Negative for color change.  Neurological: Negative for weakness (No right arm/hand weakness related to the fall), light-headedness, numbness (Or tingling in right arm from fall) and headaches.       Objective:   Vitals:   06/05/18 1308  BP: 110/60  Pulse: 95  Resp: 16  Temp:  97.7 F (36.5 C)  SpO2: 90%   BP Readings from Last 3 Encounters:  06/05/18 110/60  05/11/18 (!) 111/53  04/12/18 (!) 110/54   Wt Readings from Last 3 Encounters:  06/05/18 97 lb (44 kg)  04/04/18 115 lb (52.2 kg)  02/20/18 112 lb (50.8 kg)   Body mass index is 18.33 kg/m.   Physical Exam  Constitutional: She appears well-developed and well-nourished. No distress.  HENT:  Head: Normocephalic and atraumatic.  Musculoskeletal: She exhibits no edema.  Normal strength right hand  Neurological: No sensory deficit (Right hand or arm).  Skin: Skin is warm and dry. She is not diaphoretic.  2 superficial skin tears right forearm-1 on anterior surface and one on posterior surface, no discharge, no surrounding erythema or swelling          Assessment & Plan:    See Problem List for Assessment and Plan of chronic medical problems.

## 2018-06-05 NOTE — Assessment & Plan Note (Signed)
She is eating significantly less, which is likely the cause of her weight loss.  She states she is not as hungry, but she really do not think that she understands that she is eating less, which is likely related to her Memory issues Her family will look into supplemental protein drinks Encourage snacking and encouraged family to encourage her to eat more We will monitor weight

## 2018-06-12 ENCOUNTER — Other Ambulatory Visit: Payer: Self-pay | Admitting: Internal Medicine

## 2018-06-12 DIAGNOSIS — N952 Postmenopausal atrophic vaginitis: Secondary | ICD-10-CM | POA: Diagnosis not present

## 2018-06-12 DIAGNOSIS — N3020 Other chronic cystitis without hematuria: Secondary | ICD-10-CM | POA: Diagnosis not present

## 2018-06-26 DIAGNOSIS — N3020 Other chronic cystitis without hematuria: Secondary | ICD-10-CM | POA: Diagnosis not present

## 2018-06-26 DIAGNOSIS — K5730 Diverticulosis of large intestine without perforation or abscess without bleeding: Secondary | ICD-10-CM | POA: Diagnosis not present

## 2018-07-04 DIAGNOSIS — N952 Postmenopausal atrophic vaginitis: Secondary | ICD-10-CM | POA: Diagnosis not present

## 2018-07-04 DIAGNOSIS — N3020 Other chronic cystitis without hematuria: Secondary | ICD-10-CM | POA: Diagnosis not present

## 2018-07-13 DIAGNOSIS — H353211 Exudative age-related macular degeneration, right eye, with active choroidal neovascularization: Secondary | ICD-10-CM | POA: Diagnosis not present

## 2018-07-14 ENCOUNTER — Ambulatory Visit: Payer: Medicare Other | Admitting: Internal Medicine

## 2018-07-16 ENCOUNTER — Other Ambulatory Visit: Payer: Self-pay | Admitting: Cardiovascular Disease

## 2018-07-16 ENCOUNTER — Other Ambulatory Visit: Payer: Self-pay | Admitting: Internal Medicine

## 2018-07-16 DIAGNOSIS — I200 Unstable angina: Secondary | ICD-10-CM

## 2018-07-16 DIAGNOSIS — I25700 Atherosclerosis of coronary artery bypass graft(s), unspecified, with unstable angina pectoris: Secondary | ICD-10-CM

## 2018-07-19 DIAGNOSIS — H353211 Exudative age-related macular degeneration, right eye, with active choroidal neovascularization: Secondary | ICD-10-CM | POA: Diagnosis not present

## 2018-07-19 DIAGNOSIS — H43813 Vitreous degeneration, bilateral: Secondary | ICD-10-CM | POA: Diagnosis not present

## 2018-07-22 ENCOUNTER — Other Ambulatory Visit: Payer: Self-pay | Admitting: Internal Medicine

## 2018-07-23 NOTE — Patient Instructions (Addendum)
  Test(s) ordered today. Your results will be released to [place] (or called to you) after review, usually within 72hours after test completion. If any changes need to be made, you will be notified at that same time.    Medications reviewed and updated.  No changes recommended at this time.  Depending on your blood work we will adjust some of your medications.   Your prescription(s) have been submitted to your pharmacy. Please take as directed and contact our office if you believe you are having problem(s) with the medication(s).   Please followup in 3 months

## 2018-07-23 NOTE — Progress Notes (Signed)
Subjective:    Patient ID: Janet Steele, female    DOB: 01-22-1929, 84 y.o.   MRN: [id number]  HPI The patient is here for follow up.  She is at the visit alone.  Her husband is in the waiting room.    Diabetes: She is taking her medication daily as prescribed. She is compliant with a diabetic diet. She is not exercising regularly. She states her sugars have been good - she does not know the numbers.     Hypertension: She is taking her medication daily. She is compliant with a low sodium diet.  She denies chest pain, palpitations, edema, shortness of breath. She is not exercising regularly.      Hyperlipidemia: She is taking her medication daily. She is compliant with a low fat/cholesterol diet. She is not exercising regularly. She denies myalgias.   Hypothyroidism:  She is taking her medication daily.  She denies any recent changes in energy or weight that are unexplained.  She states her energy level is low, but that has not changed.    H/o CVA with right sided weakness: she takes her plavix, statin and ASA daily.   She is frustrated by her lack of being able to get around well - walking is still difficult.    Disturbance in sleep:  She takes trazodone nightly.  She is not able to sleep even with the medication.  She states she does not sleep during the day.  She did not sleep at all the past three nights.  She does have dementia and not a good historian so I cam not sure how accurate this is.    Anxiety: She is taking cymbalta daily as prescribed. She denies any side effects from the medication. She feels her anxiety is well controlled.   Medications and allergies reviewed with patient and updated if appropriate.  Patient Active Problem List   Diagnosis Date Noted  . Skin tear of forearm without complication, initial encounter 06/05/2018  . Weight loss 06/05/2018  . UTI (urinary tract infection) 04/12/2018  . Stroke ([place]) 11/04/2017  . Weakness 11/03/2017  . History of  cerebrovascular accident (CVA) with residual deficit 11/03/2017  . Chronic back pain 07/04/2017  . PAD (peripheral artery disease) ([place]) 01/17/2017  . Overactive bladder 12/17/2016  . Anemia 05/20/2016  . Coronary artery disease involving coronary bypass graft of native heart without angina pectoris 05/19/2016  . Numbness and tingling of foot 12/31/2015  . MCI (mild cognitive impairment) with memory loss 11/18/2015  . Facial tic 05/07/2015  . Facial nerve spasm 05/07/2015  . Sick sinus syndrome ([place]) 02/26/2015  . Cervical dystonia 12/04/2014  . Chronic motor tic 09/12/2014  . Unstable angina ([place]) 03/02/2014  . Pacemaker-Medtronic 07/07/2012  . GERD (gastroesophageal reflux disease) 07/05/2012  . Abnormal involuntary movement 12/17/2009  . Disturbance in sleep behavior 12/11/2009  . CAD, ARTERY BYPASS GRAFT 09/24/2009  . CAROTID BRUIT 09/24/2009  . Diabetes ([place]) 01/30/2009  . DYSPHAGIA 06/19/2008  . Anxiety 02/14/2008  . EROSIVE ESOPHAGITIS 02/14/2008  . CONSTIPATION, CHRONIC 02/14/2008  . DEGENERATIVE JOINT DISEASE 02/14/2008  . HEARING LOSS 01/04/2008  . PVD 01/04/2008  . Hypothyroidism 09/25/2007  . HYPERLIPIDEMIA 09/25/2007  . Essential hypertension 09/07/2007  . Diabetic polyneuropathy ([place]) 03/16/2007  . Stricture and stenosis of esophagus 03/16/2007  . HIATAL HERNIA 02/16/2007  . HEMORRHOIDS 12/31/1997  . DIVERTICULOSIS, COLON 12/31/1997    Current Outpatient Medications on File Prior to Visit  Medication Sig Dispense Refill  . acetaminophen (  TYLENOL) 500 MG tablet Take 2 tablets (1,000 mg total) by mouth every 6 (six) hours as needed. 30 tablet 0  . atorvastatin (LIPITOR) 80 MG tablet Take 1 tablet (80 mg total) by mouth daily. 90 tablet 1  . clopidogrel (PLAVIX) 75 MG tablet TAKE 1 TABLET BY MOUTH DAILY 30 tablet 0  . DULoxetine (CYMBALTA) 60 MG capsule Take 1 capsule (60 mg total) by mouth daily. 90 capsule 1  . levothyroxine (SYNTHROID, LEVOTHROID) 125 MCG  tablet Take 1 tablet (125 mcg total) by mouth daily. -- LABS NEEDED FOR REFILLS 90 tablet 0  . losartan (COZAAR) 25 MG tablet Take 1 tablet (25 mg total) by mouth daily. 90 tablet 1  . metFORMIN (GLUCOPHAGE) 1000 MG tablet Take 1 tablet (1,000 mg total) by mouth 2 (two) times daily with a meal. 180 tablet 1  . metoprolol tartrate (LOPRESSOR) 25 MG tablet TAKE 1 TABLET BY MOUTH TWICE DAILY 60 tablet 0  . mupirocin ointment (BACTROBAN) 2 % Apply topically 2 (two) times daily. 30 g 0  . nitroGLYCERIN (NITROSTAT) 0.4 MG SL tablet Place 1 tablet (0.4 mg total) under the tongue every 5 (five) minutes as needed. For chest pain 25 tablet 5  . pantoprazole (PROTONIX) 40 MG tablet Take 1 tablet (40 mg total) by mouth 2 (two) times daily. 180 tablet 1  . RA ASPIRIN EC 81 MG EC tablet take 1 tablet by mouth once daily . OVERDUE FOR FOLLOW UP. PLEASE CALL AND SCHEDULE [phone]. 15 tablet 0  . sitaGLIPtin (JANUVIA) 50 MG tablet Take 1 tablet (50 mg total) by mouth daily. 90 tablet 1  . traZODone (DESYREL) 50 MG tablet TAKE 2 TABLETS BY MOUTH AT BEDTIME 180 tablet 0   No current facility-administered medications on file prior to visit.     Past Medical History:  Diagnosis Date  . Anemia   . Anxiety   . Arthritis    "fingers" (05/19/2016  . CAD (coronary artery disease)    a. s/p CABG 1982 b. redo CABG 2003. c. [place] despite med rx 05/2016: successful but complicated PCI of PDA beyond graft site, c/b localized dissection requiring overlapping stent.  . Carotid bruit    a. Duplex 01/2015: patent vessels, 1-39% BICA, f/u PRN recommened.  . Colon polyp    adenomatous  . Colon, diverticulosis   . Degenerative joint disease   . Diastolic dysfunction   . Esophageal stricture   . GERD (gastroesophageal reflux disease)   . Hiatal hernia   . History of blood transfusion    "related to my bypass"  . Hyperlipidemia   . Hypertension   . Hypothyroidism   . Ischemic cardiomyopathy    a. Cath 05/2016: EF 40%  with severe inferior wall HK, suspect hibernating myocardium.  . Myocardial infarction ([place]) 1977; 1982  . Osteoarthrosis, unspecified whether generalized or localized, unspecified site   . Presence of permanent cardiac pacemaker   . PVD (peripheral vascular disease) ([place])   . Type II diabetes mellitus ([place])   . Unspecified hearing loss     Past Surgical History:  Procedure Laterality Date  . ABDOMINAL HYSTERECTOMY    . APPENDECTOMY    . BACK SURGERY    . CARDIAC CATHETERIZATION N/A 05/19/2016   Procedure: Left Heart Cath and Cors/Grafts Angiography;  Surgeon: Belva Crome, MD;  Location: [place] CV LAB;  Service: Cardiovascular;  Laterality: N/A;  . CARDIAC CATHETERIZATION N/A 05/19/2016   Procedure: Coronary Stent Intervention;  Surgeon: Belva Crome, MD;  Location: [place] CV LAB;  Service: Cardiovascular;  Laterality: N/A;  . CATARACT EXTRACTION W/ INTRAOCULAR LENS  IMPLANT, BILATERAL Bilateral   . CHOLECYSTECTOMY OPEN    . COLONOSCOPY    . CORONARY ANGIOPLASTY WITH STENT PLACEMENT  05/19/2016   "2 stents?"  . CORONARY ARTERY BYPASS GRAFT  1982; 2003   x4(1982),02-2002 CABG X2  . ESOPHAGOGASTRODUODENOSCOPY (EGD) WITH ESOPHAGEAL DILATION  "2-3 times"  . INSERT / REPLACE / REMOVE PACEMAKER    . LEFT HEART CATHETERIZATION WITH CORONARY/GRAFT ANGIOGRAM N/A 03/05/2014   Procedure: LEFT HEART CATHETERIZATION WITH Beatrix Fetters;  Surgeon: Sinclair Grooms, MD;  Location: [facility] C[facility] LAB;  Service: Cardiovascular;  Laterality: N/A;  . LUMBAR [place] SURGERY  01-2000  . OOPHORECTOMY Bilateral   . PERMANENT PACEMAKER INSERTION N/A 07/06/2012   MDT Adapta L implanted by Dr Rayann Heman for symptomatic bradycardia  . TONSILLECTOMY  1930s    Social History   Socioeconomic History  . Marital status: Married    Spouse name: [name]  . Number of children: 3  . Years of education: 36  . Highest education level: Not on file  Occupational History  . Occupation: Retired     Fish farm manager:  RETIRED  Social Needs  . Financial resource strain: Not on file  . Food insecurity:    Worry: Not on file    Inability: Not on file  . Transportation needs:    Medical: Not on file    Non-medical: Not on file  Tobacco Use  . Smoking status: Former Smoker    Packs/day: 0.50    Years: 30.00    Pack years: 15.00    Types: Cigarettes    Last attempt to quit: 07/05/1976    Years since quitting: 42.0  . Smokeless tobacco: Never Used  Substance and Sexual Activity  . Alcohol use: No    Alcohol/week: 3.0 standard drinks    Types: 3 Glasses of wine per week    Frequency: Never    Comment: "not anymore, I haven't in I don't know years"  . Drug use: No  . Sexual activity: Never  Lifestyle  . Physical activity:    Days per week: Not on file    Minutes per session: Not on file  . Stress: Not on file  Relationships  . Social connections:    Talks on phone: Not on file    Gets together: Not on file    Attends religious service: Not on file    Active member of club or organization: Not on file    Attends meetings of clubs or organizations: Not on file    Relationship status: Not on file  Other Topics Concern  . Not on file  Social History Narrative   Patient is married [name]) and lives at home with her husband.   Patient has three adult children.   Patient is retired.   Patient has a college education.   Patient is right-handed.   Patient does not drink any caffeine.    Family History  Problem Relation Age of Onset  . Heart disease Sister   . Heart attack Sister   . Colon cancer Neg Hx   . Breast cancer Neg Hx   . Celiac disease Neg Hx   . Cirrhosis Neg Hx   . Clotting disorder Neg Hx   . Colitis Neg Hx   . Colon polyps Neg Hx   . Crohn's disease Neg Hx   . Cystic fibrosis Neg Hx   . Diabetes  Neg Hx   . Esophageal cancer Neg Hx   . Hemochromatosis Neg Hx   . Inflammatory bowel disease Neg Hx   . Irritable bowel syndrome Neg Hx   . Kidney disease Neg Hx   . Liver  cancer Neg Hx   . Liver disease Neg Hx   . Ovarian cancer Neg Hx   . Pancreatic cancer Neg Hx   . Prostate cancer Neg Hx   . Rectal cancer Neg Hx   . Stomach cancer Neg Hx   . Ulcerative colitis Neg Hx   . Uterine cancer Neg Hx   . Wilson's disease Neg Hx     Review of Systems  Constitutional: Positive for appetite change (low appetite). Negative for chills and fever.  Respiratory: Positive for cough. Negative for shortness of breath and wheezing.   Cardiovascular: Negative for chest pain, palpitations and leg swelling.  Gastrointestinal: Negative for abdominal pain and nausea.  Endocrine: Positive for cold intolerance.  Neurological: Positive for headaches (in temple and frontal regions). Negative for dizziness.  Psychiatric/Behavioral: Positive for sleep disturbance. Negative for dysphoric mood (controlled). The patient is not nervous/anxious (controlled).        Objective:   Vitals:   07/24/18 1336  BP: 118/60  Pulse: (!) 59  Resp: 14  Temp: 98.4 F (36.9 C)   BP Readings from Last 3 Encounters:  07/24/18 118/60  06/05/18 110/60  05/11/18 (!) 111/53   Wt Readings from Last 3 Encounters:  07/24/18 97 lb (44 kg)  06/05/18 97 lb (44 kg)  04/04/18 115 lb (52.2 kg)   Body mass index is 18.33 kg/m.   Physical Exam    Constitutional: Appears well-developed and well-nourished. No distress.  HENT:  Head: Normocephalic and atraumatic.  Neck: Neck supple. No tracheal deviation present. No thyromegaly present.  No cervical lymphadenopathy Cardiovascular: Normal rate, regular rhythm and normal heart sounds.   No murmur heard. No carotid bruit .  No edema Pulmonary/Chest: Effort normal and breath sounds normal. No respiratory distress. No has no wheezes. No rales.  Skin: Skin is warm and dry. Not diaphoretic.  Psychiatric: Normal mood and affect. Behavior is normal.      Assessment & Plan:    See Problem List for Assessment and Plan of chronic medical problems.

## 2018-07-24 ENCOUNTER — Other Ambulatory Visit (INDEPENDENT_AMBULATORY_CARE_PROVIDER_SITE_OTHER): Payer: Medicare Other

## 2018-07-24 ENCOUNTER — Ambulatory Visit (INDEPENDENT_AMBULATORY_CARE_PROVIDER_SITE_OTHER): Payer: Medicare Other | Admitting: Internal Medicine

## 2018-07-24 ENCOUNTER — Encounter: Payer: Self-pay | Admitting: Internal Medicine

## 2018-07-24 VITALS — BP 118/60 | HR 59 | Temp 98.4°F | Resp 14 | Ht 61.0 in | Wt 97.0 lb

## 2018-07-24 DIAGNOSIS — G479 Sleep disorder, unspecified: Secondary | ICD-10-CM

## 2018-07-24 DIAGNOSIS — I200 Unstable angina: Secondary | ICD-10-CM | POA: Diagnosis not present

## 2018-07-24 DIAGNOSIS — F419 Anxiety disorder, unspecified: Secondary | ICD-10-CM | POA: Diagnosis not present

## 2018-07-24 DIAGNOSIS — I10 Essential (primary) hypertension: Secondary | ICD-10-CM

## 2018-07-24 DIAGNOSIS — E782 Mixed hyperlipidemia: Secondary | ICD-10-CM | POA: Diagnosis not present

## 2018-07-24 DIAGNOSIS — E1165 Type 2 diabetes mellitus with hyperglycemia: Secondary | ICD-10-CM

## 2018-07-24 DIAGNOSIS — I6930 Unspecified sequelae of cerebral infarction: Secondary | ICD-10-CM | POA: Diagnosis not present

## 2018-07-24 DIAGNOSIS — E039 Hypothyroidism, unspecified: Secondary | ICD-10-CM

## 2018-07-24 LAB — CBC WITH DIFFERENTIAL/PLATELET
Basophils Absolute: 0 10*3/uL (ref 0.0–0.1)
Basophils Relative: 0.6 % (ref 0.0–3.0)
EOS ABS: 0.2 10*3/uL (ref 0.0–0.7)
EOS PCT: 3.9 % (ref 0.0–5.0)
HCT: 32.4 % — ABNORMAL LOW (ref 36.0–46.0)
HEMOGLOBIN: 10.9 g/dL — AB (ref 12.0–15.0)
LYMPHS ABS: 1.2 10*3/uL (ref 0.7–4.0)
Lymphocytes Relative: 22.6 % (ref 12.0–46.0)
MCHC: 33.8 g/dL (ref 30.0–36.0)
MCV: 85.1 fl (ref 78.0–100.0)
MONO ABS: 0.4 10*3/uL (ref 0.1–1.0)
Monocytes Relative: 7 % (ref 3.0–12.0)
NEUTROS PCT: 65.9 % (ref 43.0–77.0)
Neutro Abs: 3.6 10*3/uL (ref 1.4–7.7)
Platelets: 258 10*3/uL (ref 150.0–400.0)
RBC: 3.81 Mil/uL — AB (ref 3.87–5.11)
RDW: 14.8 % (ref 11.5–15.5)
WBC: 5.4 10*3/uL (ref 4.0–10.5)

## 2018-07-24 LAB — LIPID PANEL
CHOLESTEROL: 112 mg/dL (ref 0–200)
HDL: 29.8 mg/dL — ABNORMAL LOW (ref >39.00)
LDL CALC: 67 mg/dL (ref 0–99)
NonHDL: 82.4
TRIGLYCERIDES: 76 mg/dL (ref 0.0–149.0)
Total CHOL/HDL Ratio: 4
VLDL: 15.2 mg/dL (ref 0.0–40.0)

## 2018-07-24 LAB — COMPREHENSIVE METABOLIC PANEL
ALBUMIN: 3.6 g/dL (ref 3.5–5.2)
ALK PHOS: 64 U/L (ref 39–117)
ALT: 9 U/L (ref 0–35)
AST: 11 U/L (ref 0–37)
BILIRUBIN TOTAL: 1.1 mg/dL (ref 0.2–1.2)
BUN: 14 mg/dL (ref 6–23)
CALCIUM: 9 mg/dL (ref 8.4–10.5)
CO2: 25 mEq/L (ref 19–32)
Chloride: 107 mEq/L (ref 96–112)
Creatinine, Ser: 0.9 mg/dL (ref 0.40–1.20)
GFR: 62.98 mL/min (ref >60.00)
Glucose, Bld: 109 mg/dL — ABNORMAL HIGH (ref 70–99)
Potassium: 4.2 mEq/L (ref 3.5–5.1)
Sodium: 138 mEq/L (ref 135–145)
TOTAL PROTEIN: 6.2 g/dL (ref 6.0–8.3)

## 2018-07-24 LAB — TSH: TSH: 0.03 u[IU]/mL — AB (ref 0.35–4.50)

## 2018-07-24 LAB — HEMOGLOBIN A1C: HEMOGLOBIN A1C: 5.8 % (ref 4.6–6.5)

## 2018-07-24 MED ORDER — DULOXETINE HCL 60 MG PO CPEP: 60.0000 mg | ORAL_CAPSULE | Freq: Every day | ORAL | 1 refills | Status: DC

## 2018-07-24 NOTE — Assessment & Plan Note (Signed)
Check tsh  Titrate med dose if needed  

## 2018-07-24 NOTE — Assessment & Plan Note (Signed)
Controlled, stable Continue current dose of medication  

## 2018-07-24 NOTE — Assessment & Plan Note (Signed)
States trazodone is not working  -- will need to confirm with her husband - can consider trial of remeron No change in medication now

## 2018-07-24 NOTE — Assessment & Plan Note (Signed)
Check lipid panel  Continue daily statin  

## 2018-07-24 NOTE — Assessment & Plan Note (Signed)
BP well controlled Current regimen effective and well tolerated Continue current medications at current doses cmp  

## 2018-07-24 NOTE — Assessment & Plan Note (Signed)
Check a1c Will adjust medication if needed

## 2018-07-24 NOTE — Assessment & Plan Note (Signed)
Continue asa, plavix and statin bp well controlled

## 2018-07-25 ENCOUNTER — Telehealth: Payer: Self-pay | Admitting: *Deleted

## 2018-07-25 MED ORDER — LEVOTHYROXINE SODIUM 112 MCG PO TABS: 112.0000 ug | ORAL_TABLET | Freq: Every day | ORAL | 0 refills | Status: DC

## 2018-07-25 NOTE — Telephone Encounter (Signed)
New Levothyroxine Rx sent. See meds.   Notes recorded by Cresenciano Lick, CMA on 07/25/2018 at 9:18 AM EDT Patient's husband informed of below. He states the patient's sleep is fair. ------  Notes recorded by Binnie Rail, MD on 07/24/2018 at 8:14 PM EDT Speak with her husband (patient has dementia) - cholesterol is very good. Kidney and liver tests are normal. She has mild anemia - we will monitor this. Thyroid medication needs to be decreased to 112 mcg daily - please send in new script. We will recheck her labs in 3 months at her next appointment. Sugars are well controlled - ok to discontinue [place]. Ask her husband if her sleep is ok.

## 2018-08-13 NOTE — Progress Notes (Signed)
Subjective:    Patient ID: Janet Steele, female    DOB: [birth year], 84 y.o.   MRN: [id number]  HPI The patient is here for an acute visit.  Headaches:  She has headaches every late afternoon or at night.  The pain is located in her temples and frontal region.  Putting her finger on the frontal region of her head helps.  She does not take any tylenol.  She falls asleep and it goes away.  She denies other symptoms with the headache.  They have been going on for awhile.  She does have dementia and getting a better history is not possible.   Her husband when asked later was not able to provide much more information.   She gets a little lightheaded when she first stands up sometimes.  She states she does not sleep great.  She feels she eats well.  Her husband says her eating varies - sometimes she eats a lot, other times  Like last night she only ate a grilled cheese.   Diabetes: She is taking her medication daily as prescribed. She is compliant with a diabetic diet. She is not exercising regularly.    Depression: She is taking her medication daily as prescribed. She denies any side effects from the medication. She feels her depression is not well controlled  - she is frustrated by her medical problems, especially since the stroke.   She she was here last month she lost 2 lbs.  She states she eats well.  Her husband states that her appetite varies.  Some days she will eat everything other day she only eats when she wants and may only eat one small thing.  She has bruised at home but has not been drinking it.   Medications and allergies reviewed with patient and updated if appropriate.  Patient Active Problem List   Diagnosis Date Noted  . Weight loss 06/05/2018  . UTI (urinary tract infection) 04/12/2018  . Stroke ([place]) 11/04/2017  . Weakness 11/03/2017  . History of cerebrovascular accident (CVA) with residual deficit 11/03/2017  . Chronic back pain 07/04/2017  . PAD (peripheral artery  disease) ([place]) 01/17/2017  . Overactive bladder 12/17/2016  . Anemia 05/20/2016  . Coronary artery disease involving coronary bypass graft of native heart without angina pectoris 05/19/2016  . Numbness and tingling of foot 12/31/2015  . MCI (mild cognitive impairment) with memory loss 11/18/2015  . Facial tic 05/07/2015  . Sick sinus syndrome ([place]) 02/26/2015  . Cervical dystonia 12/04/2014  . Chronic motor tic 09/12/2014  . Unstable angina ([place]) 03/02/2014  . Pacemaker-Medtronic 07/07/2012  . GERD (gastroesophageal reflux disease) 07/05/2012  . Abnormal involuntary movement 12/17/2009  . Disturbance in sleep behavior 12/11/2009  . CAD, ARTERY BYPASS GRAFT 09/24/2009  . CAROTID BRUIT 09/24/2009  . Diabetes ([place]) 01/30/2009  . DYSPHAGIA 06/19/2008  . Anxiety 02/14/2008  . EROSIVE ESOPHAGITIS 02/14/2008  . CONSTIPATION, CHRONIC 02/14/2008  . DEGENERATIVE JOINT DISEASE 02/14/2008  . HEARING LOSS 01/04/2008  . PVD 01/04/2008  . Hypothyroidism 09/25/2007  . HYPERLIPIDEMIA 09/25/2007  . Essential hypertension 09/07/2007  . Diabetic polyneuropathy ([place]) 03/16/2007  . Stricture and stenosis of esophagus 03/16/2007  . HIATAL HERNIA 02/16/2007  . HEMORRHOIDS 12/31/1997  . DIVERTICULOSIS, COLON 12/31/1997    Current Outpatient Medications on File Prior to Visit  Medication Sig Dispense Refill  . acetaminophen (TYLENOL) 500 MG tablet Take 2 tablets (1,000 mg total) by mouth every 6 (six) hours as needed. 30 tablet 0  .  atorvastatin (LIPITOR) 80 MG tablet Take 1 tablet (80 mg total) by mouth daily. 90 tablet 1  . clopidogrel (PLAVIX) 75 MG tablet TAKE 1 TABLET BY MOUTH DAILY 30 tablet 0  . DULoxetine (CYMBALTA) 60 MG capsule Take 1 capsule (60 mg total) by mouth daily. 90 capsule 1  . levothyroxine (SYNTHROID, LEVOTHROID) 112 MCG tablet Take 1 tablet (112 mcg total) by mouth daily. 90 tablet 0  . losartan (COZAAR) 25 MG tablet Take 1 tablet (25 mg total) by mouth daily. 90 tablet 1  .  metFORMIN (GLUCOPHAGE) 1000 MG tablet Take 1 tablet (1,000 mg total) by mouth 2 (two) times daily with a meal. 180 tablet 1  . metoprolol tartrate (LOPRESSOR) 25 MG tablet TAKE 1 TABLET BY MOUTH TWICE DAILY 60 tablet 0  . mupirocin ointment (BACTROBAN) 2 % Apply topically 2 (two) times daily. 30 g 0  . nitroGLYCERIN (NITROSTAT) 0.4 MG SL tablet Place 1 tablet (0.4 mg total) under the tongue every 5 (five) minutes as needed. For chest pain 25 tablet 5  . pantoprazole (PROTONIX) 40 MG tablet Take 1 tablet (40 mg total) by mouth 2 (two) times daily. 180 tablet 1  . RA ASPIRIN EC 81 MG EC tablet take 1 tablet by mouth once daily . OVERDUE FOR FOLLOW UP. PLEASE CALL AND SCHEDULE [phone]. 15 tablet 0  . sitaGLIPtin (JANUVIA) 50 MG tablet Take 1 tablet (50 mg total) by mouth daily. 90 tablet 1  . traZODone (DESYREL) 50 MG tablet TAKE 2 TABLETS BY MOUTH AT BEDTIME 180 tablet 0   No current facility-administered medications on file prior to visit.     Past Medical History:  Diagnosis Date  . Anemia   . Anxiety   . Arthritis    "fingers" (05/19/2016  . CAD (coronary artery disease)    a. s/p CABG 1982 b. redo CABG 2003. c. [place] despite med rx 05/2016: successful but complicated PCI of PDA beyond graft site, c/b localized dissection requiring overlapping stent.  . Carotid bruit    a. Duplex 01/2015: patent vessels, 1-39% BICA, f/u PRN recommened.  . Colon polyp    adenomatous  . Colon, diverticulosis   . Degenerative joint disease   . Diastolic dysfunction   . Esophageal stricture   . GERD (gastroesophageal reflux disease)   . Hiatal hernia   . History of blood transfusion    "related to my bypass"  . Hyperlipidemia   . Hypertension   . Hypothyroidism   . Ischemic cardiomyopathy    a. Cath 05/2016: EF 40% with severe inferior wall HK, suspect hibernating myocardium.  . Myocardial infarction ([place]) 1977; 1982  . Osteoarthrosis, unspecified whether generalized or localized, unspecified site     . Presence of permanent cardiac pacemaker   . PVD (peripheral vascular disease) ([place])   . Type II diabetes mellitus ([place])   . Unspecified hearing loss     Past Surgical History:  Procedure Laterality Date  . ABDOMINAL HYSTERECTOMY    . APPENDECTOMY    . BACK SURGERY    . CARDIAC CATHETERIZATION N/A 05/19/2016   Procedure: Left Heart Cath and Cors/Grafts Angiography;  Surgeon: Belva Crome, MD;  Location: [place] [facility];  Service: Cardiovascular;  Laterality: N/A;  . CARDIAC CATHETERIZATION N/A 05/19/2016   Procedure: Coronary Stent Intervention;  Surgeon: Belva Crome, MD;  Location: [place] [facility];  Service: Cardiovascular;  Laterality: N/A;  . CATARACT EXTRACTION W/ INTRAOCULAR LENS  IMPLANT, BILATERAL Bilateral   . CHOLECYSTECTOMY  OPEN    . COLONOSCOPY    . CORONARY ANGIOPLASTY WITH STENT PLACEMENT  05/19/2016   "2 stents?"  . CORONARY ARTERY BYPASS GRAFT  1982; 2003   x4(1982),02-2002 CABG X2  . ESOPHAGOGASTRODUODENOSCOPY (EGD) WITH ESOPHAGEAL DILATION  "2-3 times"  . INSERT / REPLACE / REMOVE PACEMAKER    . LEFT HEART CATHETERIZATION WITH CORONARY/GRAFT ANGIOGRAM N/A 03/05/2014   Procedure: LEFT HEART CATHETERIZATION WITH Beatrix Fetters;  Surgeon: Sinclair Grooms, MD;  Location: [facility] CATH LAB;  Service: Cardiovascular;  Laterality: N/A;  . LUMBAR [place] SURGERY  01-2000  . OOPHORECTOMY Bilateral   . PERMANENT PACEMAKER INSERTION N/A 07/06/2012   MDT Adapta L implanted by Dr Rayann Heman for symptomatic bradycardia  . TONSILLECTOMY  1930s    Social History   Socioeconomic History  . Marital status: Married    Spouse name: [name]  . Number of children: 3  . Years of education: 99  . Highest education level: Not on file  Occupational History  . Occupation: Retired     Fish farm manager: RETIRED  Social Needs  . Financial resource strain: Not on file  . Food insecurity:    Worry: Not on file    Inability: Not on file  . Transportation needs:    Medical: Not on file     Non-medical: Not on file  Tobacco Use  . Smoking status: Former Smoker    Packs/day: 0.50    Years: 30.00    Pack years: 15.00    Types: Cigarettes    Last attempt to quit: 07/05/1976    Years since quitting: 42.1  . Smokeless tobacco: Never Used  Substance and Sexual Activity  . Alcohol use: No    Alcohol/week: 3.0 standard drinks    Types: 3 Glasses of wine per week    Frequency: Never    Comment: "not anymore, I haven't in I don't know years"  . Drug use: No  . Sexual activity: Never  Lifestyle  . Physical activity:    Days per week: Not on file    Minutes per session: Not on file  . Stress: Not on file  Relationships  . Social connections:    Talks on phone: Not on file    Gets together: Not on file    Attends religious service: Not on file    Active member of club or organization: Not on file    Attends meetings of clubs or organizations: Not on file    Relationship status: Not on file  Other Topics Concern  . Not on file  Social History Narrative   Patient is married [name]) and lives at home with her husband.   Patient has three adult children.   Patient is retired.   Patient has a college education.   Patient is right-handed.   Patient does not drink any caffeine.    Family History  Problem Relation Age of Onset  . Heart disease Sister   . Heart attack Sister   . Colon cancer Neg Hx   . Breast cancer Neg Hx   . Celiac disease Neg Hx   . Cirrhosis Neg Hx   . Clotting disorder Neg Hx   . Colitis Neg Hx   . Colon polyps Neg Hx   . Crohn's disease Neg Hx   . Cystic fibrosis Neg Hx   . Diabetes Neg Hx   . Esophageal cancer Neg Hx   . Hemochromatosis Neg Hx   . Inflammatory bowel disease Neg Hx   .  Irritable bowel syndrome Neg Hx   . Kidney disease Neg Hx   . Liver cancer Neg Hx   . Liver disease Neg Hx   . Ovarian cancer Neg Hx   . Pancreatic cancer Neg Hx   . Prostate cancer Neg Hx   . Rectal cancer Neg Hx   . Stomach cancer Neg Hx   .  Ulcerative colitis Neg Hx   . Uterine cancer Neg Hx   . Wilson's disease Neg Hx     Review of Systems  Constitutional: Negative for chills and fever.  HENT: Positive for sinus pressure. Negative for trouble swallowing.   Respiratory: Positive for cough. Negative for shortness of breath and wheezing.   Cardiovascular: Negative for chest pain, palpitations and leg swelling.  Gastrointestinal: Positive for diarrhea (intermittent). Negative for abdominal pain and nausea.  Neurological: Positive for light-headedness (when she first stands up) and headaches. Negative for numbness.       Objective:   Vitals:   08/14/18 1300  BP: 130/64  Pulse: 67  Resp: 16  Temp: 98.4 F (36.9 C)  SpO2: 91%   BP Readings from Last 3 Encounters:  08/14/18 130/64  07/24/18 118/60  06/05/18 110/60   Wt Readings from Last 3 Encounters:  08/14/18 95 lb 12.8 oz (43.5 kg)  07/24/18 97 lb (44 kg)  06/05/18 97 lb (44 kg)   Body mass index is 18.1 kg/m.   Physical Exam         Assessment & Plan:    See Problem List for Assessment and Plan of chronic medical problems.

## 2018-08-14 ENCOUNTER — Ambulatory Visit (INDEPENDENT_AMBULATORY_CARE_PROVIDER_SITE_OTHER): Payer: Medicare Other | Admitting: Internal Medicine

## 2018-08-14 ENCOUNTER — Encounter: Payer: Self-pay | Admitting: Internal Medicine

## 2018-08-14 VITALS — BP 130/64 | HR 67 | Temp 98.4°F | Resp 16 | Ht 61.0 in | Wt 95.8 lb

## 2018-08-14 DIAGNOSIS — G479 Sleep disorder, unspecified: Secondary | ICD-10-CM | POA: Diagnosis not present

## 2018-08-14 DIAGNOSIS — G4489 Other headache syndrome: Secondary | ICD-10-CM | POA: Insufficient documentation

## 2018-08-14 DIAGNOSIS — E1165 Type 2 diabetes mellitus with hyperglycemia: Secondary | ICD-10-CM

## 2018-08-14 DIAGNOSIS — I200 Unstable angina: Secondary | ICD-10-CM

## 2018-08-14 DIAGNOSIS — E039 Hypothyroidism, unspecified: Secondary | ICD-10-CM | POA: Diagnosis not present

## 2018-08-14 DIAGNOSIS — F3289 Other specified depressive episodes: Secondary | ICD-10-CM

## 2018-08-14 DIAGNOSIS — F329 Major depressive disorder, single episode, unspecified: Secondary | ICD-10-CM | POA: Insufficient documentation

## 2018-08-14 DIAGNOSIS — F32A Depression, unspecified: Secondary | ICD-10-CM | POA: Insufficient documentation

## 2018-08-14 DIAGNOSIS — R634 Abnormal weight loss: Secondary | ICD-10-CM | POA: Diagnosis not present

## 2018-08-14 MED ORDER — DULOXETINE HCL 30 MG PO CPEP: 30.0000 mg | ORAL_CAPSULE | Freq: Every day | ORAL | 0 refills | Status: DC

## 2018-08-14 MED ORDER — METFORMIN HCL 1000 MG PO TABS: 1000.0000 mg | ORAL_TABLET | Freq: Every day | ORAL | 1 refills | Status: DC

## 2018-08-14 MED ORDER — LEVOTHYROXINE SODIUM 88 MCG PO TABS: 88.0000 ug | ORAL_TABLET | Freq: Every day | ORAL | 3 refills | Status: DC

## 2018-08-14 MED ORDER — MIRTAZAPINE 15 MG PO TABS: 15.0000 mg | ORAL_TABLET | Freq: Every day | ORAL | 3 refills | Status: DC

## 2018-08-14 NOTE — Assessment & Plan Note (Signed)
Still with depression-mostly related to medical problems and physical limitations Taking Cymbalta 60 mg daily-we will decrease to 30 mg daily for 2 weeks and then discontinue Will start Remeron to see if that helps with both her sleep, appetite and depression Follow-up in 6 weeks

## 2018-08-14 NOTE — Assessment & Plan Note (Signed)
?    How well her sleep is controlled She states she does not sleep well We will discontinue trazodone in 2 weeks when she is continues to Cymbalta Trial of Remeron 15 mg nightly

## 2018-08-14 NOTE — Assessment & Plan Note (Signed)
Patient with dementia so history is limited Complaining of daily headaches late afternoon or evening that occurred in the temporal region and frontal region Headaches resolved overnight without treatment No associated symptoms Given her weight loss will obtain a CT of the head Have made some medication changes today, which may also relieve headaches If headaches persist and CT is normal may refer to neurology for further evaluation

## 2018-08-14 NOTE — Assessment & Plan Note (Addendum)
Last a1c 5.8% Will stop the januvia and decrease the metformin to 1000 mg once daily Monitor

## 2018-08-14 NOTE — Assessment & Plan Note (Signed)
Continues to lose weight TSH has been low despite changes in medication Will decrease significantly levothyroxine dose and recheck TSH in 6 weeks Eating fair-no good history from herself or her husband to know exactly how much she is truly eating If her weight has continued to go down despite dramatic decrease in levothyroxine may need further evaluation to rule out cancer

## 2018-08-14 NOTE — Assessment & Plan Note (Signed)
Losing weight Last tsh low - levothyroxine was adjusted, but still losing weight Decrease levothyroxine to 88 mcg Recheck tsh in 6 weeks

## 2018-08-14 NOTE — Patient Instructions (Addendum)
Decrease levothyroxine to 88 mcg daily.    (sent to pharmacy)  Decrease metformin to 1000 mg ( 1 pill) with breakfast  Stop [place].    Decrease cymbalta to 30 mg daily for two weeks and then stop.  When she stops this -  stop trazodone and start remeron at night ( this is for depression and for sleep).  ( both sent to pharmacy)   A Ct scan of her head was ordered to evaluate her headaches - I am unsure of the cause.   Follow up in 6 weeks - blood work can be done at that time.

## 2018-08-15 ENCOUNTER — Other Ambulatory Visit: Payer: Self-pay | Admitting: Internal Medicine

## 2018-08-15 ENCOUNTER — Other Ambulatory Visit: Payer: Self-pay | Admitting: Cardiovascular Disease

## 2018-08-15 DIAGNOSIS — E1165 Type 2 diabetes mellitus with hyperglycemia: Secondary | ICD-10-CM

## 2018-08-15 DIAGNOSIS — I25700 Atherosclerosis of coronary artery bypass graft(s), unspecified, with unstable angina pectoris: Secondary | ICD-10-CM

## 2018-08-15 DIAGNOSIS — I200 Unstable angina: Secondary | ICD-10-CM

## 2018-08-16 ENCOUNTER — Ambulatory Visit: Payer: Medicare Other | Admitting: Cardiology

## 2018-08-17 ENCOUNTER — Other Ambulatory Visit: Payer: Medicare Other

## 2018-08-21 ENCOUNTER — Ambulatory Visit (INDEPENDENT_AMBULATORY_CARE_PROVIDER_SITE_OTHER)
Admission: RE | Admit: 2018-08-21 | Discharge: 2018-08-21 | Disposition: A | Discharge status: Home / Self Care | Payer: Medicare Other | Source: Ambulatory Visit | Attending: Internal Medicine | Admitting: Internal Medicine

## 2018-08-21 DIAGNOSIS — G4489 Other headache syndrome: Secondary | ICD-10-CM | POA: Diagnosis not present

## 2018-08-21 DIAGNOSIS — R51 Headache: Secondary | ICD-10-CM | POA: Diagnosis not present

## 2018-08-30 ENCOUNTER — Other Ambulatory Visit: Payer: Self-pay | Admitting: Internal Medicine

## 2018-08-30 ENCOUNTER — Other Ambulatory Visit: Payer: Self-pay | Admitting: Cardiovascular Disease

## 2018-08-30 DIAGNOSIS — I25700 Atherosclerosis of coronary artery bypass graft(s), unspecified, with unstable angina pectoris: Secondary | ICD-10-CM

## 2018-08-30 DIAGNOSIS — I200 Unstable angina: Secondary | ICD-10-CM

## 2018-08-30 DIAGNOSIS — H353211 Exudative age-related macular degeneration, right eye, with active choroidal neovascularization: Secondary | ICD-10-CM | POA: Diagnosis not present

## 2018-09-05 ENCOUNTER — Other Ambulatory Visit: Payer: Self-pay | Admitting: Internal Medicine

## 2018-09-05 ENCOUNTER — Telehealth: Payer: Self-pay | Admitting: *Deleted

## 2018-09-05 NOTE — Telephone Encounter (Signed)
Spoke with pts son and clarified all medication changes. Spoke with pharmacy as well and clarified list. Son was very understanding.

## 2018-09-05 NOTE — Telephone Encounter (Signed)
Pt's son called requesting to review pt's medication. Pt's husband present during call. Son states he picked up pt's medications today and Januvia was included in the prescriptions. States once home he reviewed pt's  med list and Celesta Gentile is not on list.  Reviewed notes, med was discontinued by Dr. Quay Burow on 08/14/18. Son states he paid  $130.00 for medication that should not have been refilled. He is asking what can be done from office re: this as he was told he could not return medication to pharmacy.  Also states Trazodone was refilled but he did not take medication home.  Also requesting information on forms for DPR. States he has filled out forms twice but always has issues getting information. Would like CB today. Please advise: [phone]

## 2018-09-10 NOTE — Progress Notes (Signed)
Subjective:    Patient ID: Janet Steele, female    DOB: [birth year], 84 y.o.   MRN: [id number]  HPI She is here for an acute visit for cold symptoms.  Her symptoms started one month ago  She is experiencing a cough for the past month.  She has been coughing up gray sputum for one month - it is better since yesterday.  She has occasional sinus pressure and has headaches.  She denies fever, chill, sob and wheeze.  She has taken cough suppressants.    Her right hand is numb and lot and it is harder to use.    She feels depressed and frustrated by what she can no longer do.  She wants to be fully independent - she has a HHA twice a day every day.  She does not sleep well.    Medications and allergies reviewed with patient and updated if appropriate.  Patient Active Problem List   Diagnosis Date Noted  . Other headache syndrome 08/14/2018  . Depression 08/14/2018  . Weight loss 06/05/2018  . UTI (urinary tract infection) 04/12/2018  . Stroke ([place]) 11/04/2017  . Weakness 11/03/2017  . History of cerebrovascular accident (CVA) with residual deficit 11/03/2017  . Chronic back pain 07/04/2017  . PAD (peripheral artery disease) ([place]) 01/17/2017  . Overactive bladder 12/17/2016  . Anemia 05/20/2016  . Coronary artery disease involving coronary bypass graft of native heart without angina pectoris 05/19/2016  . Numbness and tingling of foot 12/31/2015  . MCI (mild cognitive impairment) with memory loss 11/18/2015  . Facial tic 05/07/2015  . Sick sinus syndrome ([place]) 02/26/2015  . Cervical dystonia 12/04/2014  . Chronic motor tic 09/12/2014  . Unstable angina ([place]) 03/02/2014  . Pacemaker-Medtronic 07/07/2012  . GERD (gastroesophageal reflux disease) 07/05/2012  . Abnormal involuntary movement 12/17/2009  . Disturbance in sleep behavior 12/11/2009  . CAD, ARTERY BYPASS GRAFT 09/24/2009  . CAROTID BRUIT 09/24/2009  . Diabetes ([place]) 01/30/2009  . DYSPHAGIA 06/19/2008  . Anxiety  02/14/2008  . EROSIVE ESOPHAGITIS 02/14/2008  . CONSTIPATION, CHRONIC 02/14/2008  . DEGENERATIVE JOINT DISEASE 02/14/2008  . HEARING LOSS 01/04/2008  . PVD 01/04/2008  . Hypothyroidism 09/25/2007  . HYPERLIPIDEMIA 09/25/2007  . Essential hypertension 09/07/2007  . Diabetic polyneuropathy ([place]) 03/16/2007  . Stricture and stenosis of esophagus 03/16/2007  . HIATAL HERNIA 02/16/2007  . HEMORRHOIDS 12/31/1997  . DIVERTICULOSIS, COLON 12/31/1997    Current Outpatient Medications on File Prior to Visit  Medication Sig Dispense Refill  . acetaminophen (TYLENOL) 500 MG tablet Take 2 tablets (1,000 mg total) by mouth every 6 (six) hours as needed. 30 tablet 0  . atorvastatin (LIPITOR) 80 MG tablet TAKE 1 TABLET BY MOUTH ONCE DAILY 90 tablet 1  . clopidogrel (PLAVIX) 75 MG tablet TAKE 1 TABLET BY MOUTH DAILY 15 tablet 0  . DULoxetine (CYMBALTA) 30 MG capsule Take 1 capsule (30 mg total) by mouth daily. 14 capsule 0  . levothyroxine (SYNTHROID, LEVOTHROID) 88 MCG tablet Take 1 tablet (88 mcg total) by mouth daily. 90 tablet 3  . losartan (COZAAR) 25 MG tablet Take 1 tablet (25 mg total) by mouth daily. 90 tablet 1  . metFORMIN (GLUCOPHAGE) 1000 MG tablet Take 1 tablet (1,000 mg total) by mouth daily with breakfast. 90 tablet 1  . metoprolol tartrate (LOPRESSOR) 25 MG tablet TAKE 1 TABLET BY MOUTH TWICE DAILY 30 tablet 0  . mirtazapine (REMERON) 15 MG tablet Take 1 tablet (15 mg total) by mouth at bedtime.  30 tablet 3  . nitroGLYCERIN (NITROSTAT) 0.4 MG SL tablet Place 1 tablet (0.4 mg total) under the tongue every 5 (five) minutes as needed. For chest pain 25 tablet 5  . pantoprazole (PROTONIX) 40 MG tablet Take 1 tablet (40 mg total) by mouth 2 (two) times daily. 180 tablet 1  . RA ASPIRIN EC 81 MG EC tablet take 1 tablet by mouth once daily . OVERDUE FOR FOLLOW UP. PLEASE CALL AND SCHEDULE [phone]. 15 tablet 0   No current facility-administered medications on file prior to visit.      Past Medical History:  Diagnosis Date  . Anemia   . Anxiety   . Arthritis    "fingers" (05/19/2016  . CAD (coronary artery disease)    a. s/p CABG 1982 b. redo CABG 2003. c. [place] despite med rx 05/2016: successful but complicated PCI of PDA beyond graft site, c/b localized dissection requiring overlapping stent.  . Carotid bruit    a. Duplex 01/2015: patent vessels, 1-39% BICA, f/u PRN recommened.  . Colon polyp    adenomatous  . Colon, diverticulosis   . Degenerative joint disease   . Diastolic dysfunction   . Esophageal stricture   . GERD (gastroesophageal reflux disease)   . Hiatal hernia   . History of blood transfusion    "related to my bypass"  . Hyperlipidemia   . Hypertension   . Hypothyroidism   . Ischemic cardiomyopathy    a. Cath 05/2016: EF 40% with severe inferior wall HK, suspect hibernating myocardium.  . Myocardial infarction ([place]) 1977; 1982  . Osteoarthrosis, unspecified whether generalized or localized, unspecified site   . Presence of permanent cardiac pacemaker   . PVD (peripheral vascular disease) ([place])   . Type II diabetes mellitus ([place])   . Unspecified hearing loss     Past Surgical History:  Procedure Laterality Date  . ABDOMINAL HYSTERECTOMY    . APPENDECTOMY    . BACK SURGERY    . CARDIAC CATHETERIZATION N/A 05/19/2016   Procedure: Left Heart Cath and Cors/Grafts Angiography;  Surgeon: Belva Crome, MD;  Location: [facility];  Service: Cardiovascular;  Laterality: N/A;  . CARDIAC CATHETERIZATION N/A 05/19/2016   Procedure: Coronary Stent Intervention;  Surgeon: Belva Crome, MD;  Location: [place] [facility];  Service: Cardiovascular;  Laterality: N/A;  . CATARACT EXTRACTION W/ INTRAOCULAR LENS  IMPLANT, BILATERAL Bilateral   . CHOLECYSTECTOMY OPEN    . COLONOSCOPY    . CORONARY ANGIOPLASTY WITH STENT PLACEMENT  05/19/2016   "2 stents?"  . CORONARY ARTERY BYPASS GRAFT  1982; 2003   x4(1982),02-2002 CABG X2  .  ESOPHAGOGASTRODUODENOSCOPY (EGD) WITH ESOPHAGEAL DILATION  "2-3 times"  . INSERT / REPLACE / REMOVE PACEMAKER    . LEFT HEART CATHETERIZATION WITH CORONARY/GRAFT ANGIOGRAM N/A 03/05/2014   Procedure: LEFT HEART CATHETERIZATION WITH Beatrix Fetters;  Surgeon: Sinclair Grooms, MD;  Location: [facility] CATH LAB;  Service: Cardiovascular;  Laterality: N/A;  . LUMBAR [place] SURGERY  01-2000  . OOPHORECTOMY Bilateral   . PERMANENT PACEMAKER INSERTION N/A 07/06/2012   MDT Adapta L implanted by Dr Rayann Heman for symptomatic bradycardia  . TONSILLECTOMY  1930s    Social History   Socioeconomic History  . Marital status: Married    Spouse name: [name]  . Number of children: 3  . Years of education: 52  . Highest education level: Not on file  Occupational History  . Occupation: Retired     Fish farm manager: RETIRED  Social Needs  .  Financial resource strain: Not on file  . Food insecurity:    Worry: Not on file    Inability: Not on file  . Transportation needs:    Medical: Not on file    Non-medical: Not on file  Tobacco Use  . Smoking status: Former Smoker    Packs/day: 0.50    Years: 30.00    Pack years: 15.00    Types: Cigarettes    Last attempt to quit: 07/05/1976    Years since quitting: 42.2  . Smokeless tobacco: Never Used  Substance and Sexual Activity  . Alcohol use: No    Alcohol/week: 3.0 standard drinks    Types: 3 Glasses of wine per week    Frequency: Never    Comment: "not anymore, I haven't in I don't know years"  . Drug use: No  . Sexual activity: Never  Lifestyle  . Physical activity:    Days per week: Not on file    Minutes per session: Not on file  . Stress: Not on file  Relationships  . Social connections:    Talks on phone: Not on file    Gets together: Not on file    Attends religious service: Not on file    Active member of club or organization: Not on file    Attends meetings of clubs or organizations: Not on file    Relationship status: Not on file  Other  Topics Concern  . Not on file  Social History Narrative   Patient is married [name]) and lives at home with her husband.   Patient has three adult children.   Patient is retired.   Patient has a college education.   Patient is right-handed.   Patient does not drink any caffeine.    Family History  Problem Relation Age of Onset  . Heart disease Sister   . Heart attack Sister   . Colon cancer Neg Hx   . Breast cancer Neg Hx   . Celiac disease Neg Hx   . Cirrhosis Neg Hx   . Clotting disorder Neg Hx   . Colitis Neg Hx   . Colon polyps Neg Hx   . Crohn's disease Neg Hx   . Cystic fibrosis Neg Hx   . Diabetes Neg Hx   . Esophageal cancer Neg Hx   . Hemochromatosis Neg Hx   . Inflammatory bowel disease Neg Hx   . Irritable bowel syndrome Neg Hx   . Kidney disease Neg Hx   . Liver cancer Neg Hx   . Liver disease Neg Hx   . Ovarian cancer Neg Hx   . Pancreatic cancer Neg Hx   . Prostate cancer Neg Hx   . Rectal cancer Neg Hx   . Stomach cancer Neg Hx   . Ulcerative colitis Neg Hx   . Uterine cancer Neg Hx   . Wilson's disease Neg Hx     Review of Systems  Constitutional: Negative for chills and fever.  HENT: Positive for sinus pressure (occ). Negative for congestion, ear pain, sinus pain and sore throat.   Respiratory: Positive for cough. Negative for shortness of breath and wheezing.   Cardiovascular: Negative for chest pain.  Neurological: Positive for headaches.       Objective:   Vitals:   09/11/18 1432  BP: 114/64  Pulse: 78  Resp: 16  SpO2: 93%   Filed Weights   09/11/18 1432  Weight: 95 lb (43.1 kg)   Body mass index is 17.95 kg/m.  Wt Readings from Last 3 Encounters:  09/11/18 95 lb (43.1 kg)  08/14/18 95 lb 12.8 oz (43.5 kg)  07/24/18 97 lb (44 kg)     Physical Exam GENERAL APPEARANCE: Appears stated age, well appearing, NAD EYES: conjunctiva clear, no icterus HEENT: bilateral tympanic membranes and ear canals normal, oropharynx with no  erythema, no thyromegaly, trachea midline, no cervical or supraclavicular lymphadenopathy LUNGS: Clear to auscultation without wheeze or crackles, unlabored breathing, good air entry bilaterally CARDIOVASCULAR: Normal S1,S2 without murmurs, no edema SKIN: warm, dry      Assessment & Plan:   See Problem List for Assessment and Plan of chronic medical problems.

## 2018-09-11 ENCOUNTER — Encounter: Payer: Self-pay | Admitting: Internal Medicine

## 2018-09-11 ENCOUNTER — Ambulatory Visit (INDEPENDENT_AMBULATORY_CARE_PROVIDER_SITE_OTHER): Payer: Medicare Other | Admitting: Internal Medicine

## 2018-09-11 VITALS — BP 114/64 | HR 78 | Resp 16 | Ht 61.0 in | Wt 95.0 lb

## 2018-09-11 DIAGNOSIS — I200 Unstable angina: Secondary | ICD-10-CM | POA: Diagnosis not present

## 2018-09-11 DIAGNOSIS — G479 Sleep disorder, unspecified: Secondary | ICD-10-CM

## 2018-09-11 DIAGNOSIS — R059 Cough, unspecified: Secondary | ICD-10-CM

## 2018-09-11 DIAGNOSIS — F3289 Other specified depressive episodes: Secondary | ICD-10-CM | POA: Diagnosis not present

## 2018-09-11 DIAGNOSIS — R05 Cough: Secondary | ICD-10-CM | POA: Diagnosis not present

## 2018-09-11 MED ORDER — MIRTAZAPINE 30 MG PO TABS: 30.0000 mg | ORAL_TABLET | Freq: Every day | ORAL | 5 refills | Status: DC

## 2018-09-11 NOTE — Assessment & Plan Note (Signed)
Chronic difficulty sleeping and feels depressed Will try increasing remeron to 30 mg daily, which will help her depression and insomnia

## 2018-09-11 NOTE — Patient Instructions (Addendum)
Cough x 1 month - getting better in the past couple of days.  We will hold off on antibiotics for now and she will just monitor.  If symptoms get worse again she will call.     Increase remeron to 30 mg at bedtime.   This is for sleep and depression.  A new prescription was sent to your pharmacy.     Follow up next month

## 2018-09-11 NOTE — Assessment & Plan Note (Signed)
Still feels depressed Will try increasing remeron to 30 mg daily, which will help her depression and insomnia

## 2018-09-11 NOTE — Assessment & Plan Note (Signed)
Cough x one month - productive of gray sputum Cough has improved x 2 days No cough today, lungs are clear She would like to avoid an antibiotic - she will just monitor for now and call if it gets worse.

## 2018-09-19 ENCOUNTER — Other Ambulatory Visit: Payer: Self-pay | Admitting: Cardiovascular Disease

## 2018-09-19 ENCOUNTER — Other Ambulatory Visit: Payer: Self-pay | Admitting: Internal Medicine

## 2018-09-19 DIAGNOSIS — I25700 Atherosclerosis of coronary artery bypass graft(s), unspecified, with unstable angina pectoris: Secondary | ICD-10-CM

## 2018-09-19 DIAGNOSIS — E1165 Type 2 diabetes mellitus with hyperglycemia: Secondary | ICD-10-CM

## 2018-09-19 DIAGNOSIS — I200 Unstable angina: Secondary | ICD-10-CM

## 2018-09-25 ENCOUNTER — Telehealth: Payer: Self-pay | Admitting: Cardiovascular Disease

## 2018-09-25 NOTE — Telephone Encounter (Signed)
New message    *STAT* If patient is at the pharmacy, call can be transferred to refill team.   1. Which medications need to be refilled? (please list name of each medication and dose if known) clopidogrel (PLAVIX) 75 MG tablet   metoprolol tartrate (LOPRESSOR) 25 MG tablet  2. Which pharmacy/location (including street and city if local pharmacy) is medication to be sent to?[facility] [phone] - [place], [address] [place]  3. Do they need a 30 day or 90 day supply? [place]

## 2018-09-25 NOTE — Telephone Encounter (Signed)
Called pt and spoke with pt's husband informing him that pt has not been seen since 2017 and that pt needed an appt and to keep appt or pt can contact PCP to get refills on pt's medication. Pt's husband stated that he would contact pt's PCP to obtain refills for the pt's medications. I advised the husband that if he has any other problems, questions or concerns or would like to schedule an overdue appt, to just give our office a call back. Pt's husband verbalized understanding.

## 2018-09-26 ENCOUNTER — Ambulatory Visit: Payer: Medicare Other | Admitting: Internal Medicine

## 2018-09-26 ENCOUNTER — Other Ambulatory Visit: Payer: Self-pay | Admitting: Internal Medicine

## 2018-09-26 DIAGNOSIS — I25700 Atherosclerosis of coronary artery bypass graft(s), unspecified, with unstable angina pectoris: Secondary | ICD-10-CM

## 2018-09-26 DIAGNOSIS — I200 Unstable angina: Secondary | ICD-10-CM

## 2018-09-26 NOTE — Progress Notes (Deleted)
Subjective:    Patient ID: Janet Steele, female    DOB: Apr 07, 1929, 84 y.o.   MRN: [id number]  HPI The patient is here for follow up.  Weight loss:  Diabetes: She is taking her medication daily as prescribed. She is compliant with a diabetic diet. She is exercising regularly. She monitors her sugars and they have been running XXX. She checks her feet daily and denies foot lesions. She is up-to-date with an ophthalmology examination.   Hypertension: She is taking her medication daily. She is compliant with a low sodium diet.  She denies chest pain, palpitations, edema, shortness of breath and regular headaches. She is exercising regularly.  She does not monitor her blood pressure at home.    Hyperlipidemia: She is taking her medication daily. She is compliant with a low fat/cholesterol diet. She is exercising regularly. She denies myalgias.   Hypothyroidism:  She is taking her medication daily.  She denies any recent changes in energy or weight that are unexplained.   History of CVA with right-sided weakness: She is taking Plavix, aspirin and her statin daily.  GERD:  She is taking her medication daily as prescribed.  She denies any GERD symptoms and feels her GERD is well controlled.   Insomnia: She has chronic sleep problems.  I did try changing her trazodone to Remeron.  Anxiety, depression: She is taking the Cymbalta daily as prescribed.  She is also on Remeron at night.  She denies any side effects.  Medications and allergies reviewed with patient and updated if appropriate.  Patient Active Problem List   Diagnosis Date Noted  . Other headache syndrome 08/14/2018  . Depression 08/14/2018  . Weight loss 06/05/2018  . UTI (urinary tract infection) 04/12/2018  . Stroke ([place]) 11/04/2017  . Weakness 11/03/2017  . History of cerebrovascular accident (CVA) with residual deficit 11/03/2017  . Chronic back pain 07/04/2017  . PAD (peripheral artery disease) ([place]) 01/17/2017  .  Overactive bladder 12/17/2016  . Anemia 05/20/2016  . Coronary artery disease involving coronary bypass graft of native heart without angina pectoris 05/19/2016  . Numbness and tingling of foot 12/31/2015  . MCI (mild cognitive impairment) with memory loss 11/18/2015  . Facial tic 05/07/2015  . Sick sinus syndrome ([place]) 02/26/2015  . Cervical dystonia 12/04/2014  . Chronic motor tic 09/12/2014  . Unstable angina ([place]) 03/02/2014  . Pacemaker-Medtronic 07/07/2012  . GERD (gastroesophageal reflux disease) 07/05/2012  . Cough 08/04/2010  . Abnormal involuntary movement 12/17/2009  . Disturbance in sleep behavior 12/11/2009  . CAD, ARTERY BYPASS GRAFT 09/24/2009  . CAROTID BRUIT 09/24/2009  . Diabetes ([place]) 01/30/2009  . DYSPHAGIA 06/19/2008  . Anxiety 02/14/2008  . EROSIVE ESOPHAGITIS 02/14/2008  . CONSTIPATION, CHRONIC 02/14/2008  . DEGENERATIVE JOINT DISEASE 02/14/2008  . HEARING LOSS 01/04/2008  . PVD 01/04/2008  . Hypothyroidism 09/25/2007  . HYPERLIPIDEMIA 09/25/2007  . Essential hypertension 09/07/2007  . Diabetic polyneuropathy ([place]) 03/16/2007  . Stricture and stenosis of esophagus 03/16/2007  . HIATAL HERNIA 02/16/2007  . HEMORRHOIDS 12/31/1997  . DIVERTICULOSIS, COLON 12/31/1997    Current Outpatient Medications on File Prior to Visit  Medication Sig Dispense Refill  . acetaminophen (TYLENOL) 500 MG tablet Take 2 tablets (1,000 mg total) by mouth every 6 (six) hours as needed. 30 tablet 0  . atorvastatin (LIPITOR) 80 MG tablet TAKE 1 TABLET BY MOUTH ONCE DAILY 90 tablet 1  . clopidogrel (PLAVIX) 75 MG tablet TAKE 1 TABLET BY MOUTH DAILY 15 tablet 0  .  DULoxetine (CYMBALTA) 30 MG capsule Take 1 capsule (30 mg total) by mouth daily. 14 capsule 0  . levothyroxine (SYNTHROID, LEVOTHROID) 88 MCG tablet Take 1 tablet (88 mcg total) by mouth daily. 90 tablet 3  . losartan (COZAAR) 25 MG tablet Take 1 tablet (25 mg total) by mouth daily. 90 tablet 1  . metFORMIN (GLUCOPHAGE)  1000 MG tablet TAKE 1 TABLET BY MOUTH TWICE DAILY WITH MEALS 180 tablet 1  . metoprolol tartrate (LOPRESSOR) 25 MG tablet TAKE 1 TABLET BY MOUTH TWICE DAILY 30 tablet 0  . mirtazapine (REMERON) 30 MG tablet Take 1 tablet (30 mg total) by mouth at bedtime. 30 tablet 5  . nitroGLYCERIN (NITROSTAT) 0.4 MG SL tablet Place 1 tablet (0.4 mg total) under the tongue every 5 (five) minutes as needed. For chest pain 25 tablet 5  . pantoprazole (PROTONIX) 40 MG tablet Take 1 tablet (40 mg total) by mouth 2 (two) times daily. 180 tablet 1  . RA ASPIRIN EC 81 MG EC tablet take 1 tablet by mouth once daily . OVERDUE FOR FOLLOW UP. PLEASE CALL AND SCHEDULE [phone]. 15 tablet 0   No current facility-administered medications on file prior to visit.     Past Medical History:  Diagnosis Date  . Anemia   . Anxiety   . Arthritis    "fingers" (05/19/2016  . CAD (coronary artery disease)    a. s/p CABG 1982 b. redo CABG 2003. c. [place] despite med rx 05/2016: successful but complicated PCI of PDA beyond graft site, c/b localized dissection requiring overlapping stent.  . Carotid bruit    a. Duplex 01/2015: patent vessels, 1-39% BICA, f/u PRN recommened.  . Colon polyp    adenomatous  . Colon, diverticulosis   . Degenerative joint disease   . Diastolic dysfunction   . Esophageal stricture   . GERD (gastroesophageal reflux disease)   . Hiatal hernia   . History of blood transfusion    "related to my bypass"  . Hyperlipidemia   . Hypertension   . Hypothyroidism   . Ischemic cardiomyopathy    a. Cath 05/2016: EF 40% with severe inferior wall HK, suspect hibernating myocardium.  . Myocardial infarction ([place]) 1977; 1982  . Osteoarthrosis, unspecified whether generalized or localized, unspecified site   . Presence of permanent cardiac pacemaker   . PVD (peripheral vascular disease) ([place])   . Type II diabetes mellitus ([place])   . Unspecified hearing loss     Past Surgical History:  Procedure Laterality Date   . ABDOMINAL HYSTERECTOMY    . APPENDECTOMY    . BACK SURGERY    . CARDIAC CATHETERIZATION N/A 05/19/2016   Procedure: Left Heart Cath and Cors/Grafts Angiography;  Surgeon: Belva Crome, MD;  Location: [facility];  Service: Cardiovascular;  Laterality: N/A;  . CARDIAC CATHETERIZATION N/A 05/19/2016   Procedure: Coronary Stent Intervention;  Surgeon: Belva Crome, MD;  Location: [place] [facility];  Service: Cardiovascular;  Laterality: N/A;  . CATARACT EXTRACTION W/ INTRAOCULAR LENS  IMPLANT, BILATERAL Bilateral   . CHOLECYSTECTOMY OPEN    . COLONOSCOPY    . CORONARY ANGIOPLASTY WITH STENT PLACEMENT  05/19/2016   "2 stents?"  . CORONARY ARTERY BYPASS GRAFT  1982; 2003   x4(1982),02-2002 CABG X2  . ESOPHAGOGASTRODUODENOSCOPY (EGD) WITH ESOPHAGEAL DILATION  "2-3 times"  . INSERT / REPLACE / REMOVE PACEMAKER    . LEFT HEART CATHETERIZATION WITH CORONARY/GRAFT ANGIOGRAM N/A 03/05/2014   Procedure: LEFT HEART CATHETERIZATION WITH CORONARY/GRAFT ANGIOGRAM;  Surgeon: Sinclair Grooms, MD;  Location[facility] C[facility] LAB;  Service: Cardiovascular;  Laterality: N/A;  . LUMBAR [place] SURGERY  01-2000  . OOPHORECTOMY Bilateral   . PERMANENT PACEMAKER INSERTION N/A 07/06/2012   MDT Adapta L implanted by Dr Rayann Heman for symptomatic bradycardia  . TONSILLECTOMY  1930s    Social History   Socioeconomic History  . Marital status: Married    Spouse name: [name]  . Number of children: 3  . Years of education: 51  . Highest education level: Not on file  Occupational History  . Occupation: Retired     Fish farm manager: RETIRED  Social Needs  . Financial resource strain: Not on file  . Food insecurity:    Worry: Not on file    Inability: Not on file  . Transportation needs:    Medical: Not on file    Non-medical: Not on file  Tobacco Use  . Smoking status: Former Smoker    Packs/day: 0.50    Years: 30.00    Pack years: 15.00    Types: Cigarettes    Last attempt to quit: 07/05/1976    Years since  quitting: 42.2  . Smokeless tobacco: Never Used  Substance and Sexual Activity  . Alcohol use: No    Alcohol/week: 3.0 standard drinks    Types: 3 Glasses of wine per week    Frequency: Never    Comment: "not anymore, I haven't in I don't know years"  . Drug use: No  . Sexual activity: Never  Lifestyle  . Physical activity:    Days per week: Not on file    Minutes per session: Not on file  . Stress: Not on file  Relationships  . Social connections:    Talks on phone: Not on file    Gets together: Not on file    Attends religious service: Not on file    Active member of club or organization: Not on file    Attends meetings of clubs or organizations: Not on file    Relationship status: Not on file  Other Topics Concern  . Not on file  Social History Narrative   Patient is married [name]) and lives at home with her husband.   Patient has three adult children.   Patient is retired.   Patient has a college education.   Patient is right-handed.   Patient does not drink any caffeine.    Family History  Problem Relation Age of Onset  . Heart disease Sister   . Heart attack Sister   . Colon cancer Neg Hx   . Breast cancer Neg Hx   . Celiac disease Neg Hx   . Cirrhosis Neg Hx   . Clotting disorder Neg Hx   . Colitis Neg Hx   . Colon polyps Neg Hx   . Crohn's disease Neg Hx   . Cystic fibrosis Neg Hx   . Diabetes Neg Hx   . Esophageal cancer Neg Hx   . Hemochromatosis Neg Hx   . Inflammatory bowel disease Neg Hx   . Irritable bowel syndrome Neg Hx   . Kidney disease Neg Hx   . Liver cancer Neg Hx   . Liver disease Neg Hx   . Ovarian cancer Neg Hx   . Pancreatic cancer Neg Hx   . Prostate cancer Neg Hx   . Rectal cancer Neg Hx   . Stomach cancer Neg Hx   . Ulcerative colitis Neg Hx   . Uterine cancer Neg Hx   .  Wilson's disease Neg Hx     Review of Systems     Objective:  There were no vitals filed for this visit. BP Readings from Last 3 Encounters:    09/11/18 114/64  08/14/18 130/64  07/24/18 118/60   Wt Readings from Last 3 Encounters:  09/11/18 95 lb (43.1 kg)  08/14/18 95 lb 12.8 oz (43.5 kg)  07/24/18 97 lb (44 kg)   There is no height or weight on file to calculate BMI.   Physical Exam    Constitutional: Appears well-developed and well-nourished. No distress.  HENT:  Head: Normocephalic and atraumatic.  Neck: Neck supple. No tracheal deviation present. No thyromegaly present.  No cervical lymphadenopathy Cardiovascular: Normal rate, regular rhythm and normal heart sounds.   No murmur heard. No carotid bruit .  No edema Pulmonary/Chest: Effort normal and breath sounds normal. No respiratory distress. No has no wheezes. No rales.  Skin: Skin is warm and dry. Not diaphoretic.  Psychiatric: Normal mood and affect. Behavior is normal.      Assessment & Plan:    See Problem List for Assessment and Plan of chronic medical problems.

## 2018-09-26 NOTE — Telephone Encounter (Signed)
MD approved and sent electronically to [facility]../[facility]  

## 2018-09-26 NOTE — Telephone Encounter (Signed)
This medication was previously prescribed by Dr. Burt Knack but since pt has not been seen they requested either pt to make an appointment or get the medication from PCP. Please advise.

## 2018-09-28 NOTE — Progress Notes (Signed)
Subjective:    Patient ID: Janet Steele, female    DOB: July 08, 1929, 84 y.o.   MRN: [id number]  HPI The patient is here for follow up.  She is here today alone-her husband is in the waiting room.  Weight loss:  She has gained weight since she was here last.  She states she is eating well.  Diabetes: She is taking her medication daily as prescribed. She is compliant with a diabetic diet. She is not exercising regularly. She checks her feet daily and denies foot lesions. She is up-to-date with an ophthalmology examination.   Hypertension: She is taking her medication daily. She is compliant with a low sodium diet.  She denies palpitations, edema, shortness of breath and regular headaches. She is not exercising regularly.  She does not monitor her blood pressure at home.    Hyperlipidemia: She is taking her medication daily. She is compliant with a low fat/cholesterol diet. She is not exercising regularly. She denies myalgias.   Hypothyroidism:  She is taking her medication daily.  She denies any recent changes in energy or weight that are unexplained.   History of CVA with right-sided weakness: She is taking Plavix, aspirin and her statin daily.  GERD:  She is taking her medication daily as prescribed.  She denies any GERD symptoms and feels her GERD is well controlled.   Insomnia: She has chronic sleep problems.  I did try changing her trazodone to Remeron.  She states medication is not helping and she is not sleeping.  Anxiety, depression: She is taking the Cymbalta daily as prescribed.  She is also on Remeron at night.  She denies any side effects.  She states her headaches have improved.  Medications and allergies reviewed with patient and updated if appropriate.  Patient Active Problem List   Diagnosis Date Noted  . Other headache syndrome 08/14/2018  . Depression 08/14/2018  . Weight loss 06/05/2018  . UTI (urinary tract infection) 04/12/2018  . Stroke ([place]) 11/04/2017  .  Weakness 11/03/2017  . History of cerebrovascular accident (CVA) with residual deficit 11/03/2017  . Chronic back pain 07/04/2017  . PAD (peripheral artery disease) ([place]) 01/17/2017  . Overactive bladder 12/17/2016  . Anemia 05/20/2016  . Coronary artery disease involving coronary bypass graft of native heart without angina pectoris 05/19/2016  . Numbness and tingling of foot 12/31/2015  . MCI (mild cognitive impairment) with memory loss 11/18/2015  . Facial tic 05/07/2015  . Sick sinus syndrome ([place]) 02/26/2015  . Cervical dystonia 12/04/2014  . Chronic motor tic 09/12/2014  . Unstable angina ([place]) 03/02/2014  . Pacemaker-Medtronic 07/07/2012  . GERD (gastroesophageal reflux disease) 07/05/2012  . Cough 08/04/2010  . Abnormal involuntary movement 12/17/2009  . Disturbance in sleep behavior 12/11/2009  . CAD, ARTERY BYPASS GRAFT 09/24/2009  . CAROTID BRUIT 09/24/2009  . Diabetes ([place]) 01/30/2009  . DYSPHAGIA 06/19/2008  . Anxiety 02/14/2008  . EROSIVE ESOPHAGITIS 02/14/2008  . CONSTIPATION, CHRONIC 02/14/2008  . DEGENERATIVE JOINT DISEASE 02/14/2008  . HEARING LOSS 01/04/2008  . PVD 01/04/2008  . Hypothyroidism 09/25/2007  . HYPERLIPIDEMIA 09/25/2007  . Essential hypertension 09/07/2007  . Diabetic polyneuropathy ([place]) 03/16/2007  . Stricture and stenosis of esophagus 03/16/2007  . HIATAL HERNIA 02/16/2007  . HEMORRHOIDS 12/31/1997  . DIVERTICULOSIS, COLON 12/31/1997    Current Outpatient Medications on File Prior to Visit  Medication Sig Dispense Refill  . acetaminophen (TYLENOL) 500 MG tablet Take 2 tablets (1,000 mg total) by mouth every 6 (six) hours  as needed. 30 tablet 0  . atorvastatin (LIPITOR) 80 MG tablet TAKE 1 TABLET BY MOUTH ONCE DAILY 90 tablet 1  . clopidogrel (PLAVIX) 75 MG tablet TAKE 1 TABLET DAILY 90 tablet 0  . DULoxetine (CYMBALTA) 30 MG capsule Take 1 capsule (30 mg total) by mouth daily. 14 capsule 0  . levothyroxine (SYNTHROID, LEVOTHROID) 88 MCG  tablet Take 1 tablet (88 mcg total) by mouth daily. 90 tablet 3  . losartan (COZAAR) 25 MG tablet Take 1 tablet (25 mg total) by mouth daily. 90 tablet 1  . metFORMIN (GLUCOPHAGE) 1000 MG tablet TAKE 1 TABLET BY MOUTH TWICE DAILY WITH MEALS 180 tablet 1  . metoprolol tartrate (LOPRESSOR) 25 MG tablet TAKE 1 TABLET BY MOUTH TWICE DAILY 180 tablet 0  . mirtazapine (REMERON) 30 MG tablet Take 1 tablet (30 mg total) by mouth at bedtime. 30 tablet 5  . nitroGLYCERIN (NITROSTAT) 0.4 MG SL tablet Place 1 tablet (0.4 mg total) under the tongue every 5 (five) minutes as needed. For chest pain 25 tablet 5  . pantoprazole (PROTONIX) 40 MG tablet Take 1 tablet (40 mg total) by mouth 2 (two) times daily. 180 tablet 1  . RA ASPIRIN EC 81 MG EC tablet take 1 tablet by mouth once daily . OVERDUE FOR FOLLOW UP. PLEASE CALL AND SCHEDULE [phone]. 15 tablet 0   No current facility-administered medications on file prior to visit.     Past Medical History:  Diagnosis Date  . Anemia   . Anxiety   . Arthritis    "fingers" (05/19/2016  . CAD (coronary artery disease)    a. s/p CABG 1982 b. redo CABG 2003. c. [place] despite med rx 05/2016: successful but complicated PCI of PDA beyond graft site, c/b localized dissection requiring overlapping stent.  . Carotid bruit    a. Duplex 01/2015: patent vessels, 1-39% BICA, f/u PRN recommened.  . Colon polyp    adenomatous  . Colon, diverticulosis   . Degenerative joint disease   . Diastolic dysfunction   . Esophageal stricture   . GERD (gastroesophageal reflux disease)   . Hiatal hernia   . History of blood transfusion    "related to my bypass"  . Hyperlipidemia   . Hypertension   . Hypothyroidism   . Ischemic cardiomyopathy    a. Cath 05/2016: EF 40% with severe inferior wall HK, suspect hibernating myocardium.  . Myocardial infarction ([place]) 1977; 1982  . Osteoarthrosis, unspecified whether generalized or localized, unspecified site   . Presence of permanent  cardiac pacemaker   . PVD (peripheral vascular disease) ([place])   . Type II diabetes mellitus ([place])   . Unspecified hearing loss     Past Surgical History:  Procedure Laterality Date  . ABDOMINAL HYSTERECTOMY    . APPENDECTOMY    . BACK SURGERY    . CARDIAC CATHETERIZATION N/A 05/19/2016   Procedure: Left Heart Cath and Cors/Grafts Angiography;  Surgeon: Belva Crome, MD;  Location: [facility];  Service: Cardiovascular;  Laterality: N/A;  . CARDIAC CATHETERIZATION N/A 05/19/2016   Procedure: Coronary Stent Intervention;  Surgeon: Belva Crome, MD;  Location: [facility];  Service: Cardiovascular;  Laterality: N/A;  . CATARACT EXTRACTION W/ INTRAOCULAR LENS  IMPLANT, BILATERAL Bilateral   . CHOLECYSTECTOMY OPEN    . COLONOSCOPY    . CORONARY ANGIOPLASTY WITH STENT PLACEMENT  05/19/2016   "2 stents?"  . CORONARY ARTERY BYPASS GRAFT  1982; 2003   x4(1982),02-2002 CABG X2  .  ESOPHAGOGASTRODUODENOSCOPY (EGD) WITH ESOPHAGEAL DILATION  "2-3 times"  . INSERT / REPLACE / REMOVE PACEMAKER    . LEFT HEART CATHETERIZATION WITH CORONARY/GRAFT ANGIOGRAM N/A 03/05/2014   Procedure: LEFT HEART CATHETERIZATION WITH Beatrix Fetters;  Surgeon: Sinclair Grooms, MD;  Location: [facility] C[facility];  Service: Cardiovascular;  Laterality: N/A;  . LUMBAR [place] SURGERY  01-2000  . OOPHORECTOMY Bilateral   . PERMANENT PACEMAKER INSERTION N/A 07/06/2012   MDT Adapta L implanted by Dr Rayann Heman for symptomatic bradycardia  . TONSILLECTOMY  1930s    Social History   Socioeconomic History  . Marital status: Married    Spouse name: [name]  . Number of children: 3  . Years of education: 41  . Highest education level: Not on file  Occupational History  . Occupation: Retired     Fish farm manager: RETIRED  Social Needs  . Financial resource strain: Not on file  . Food insecurity:    Worry: Not on file    Inability: Not on file  . Transportation needs:    Medical: Not on file    Non-medical: Not on file   Tobacco Use  . Smoking status: Former Smoker    Packs/day: 0.50    Years: 30.00    Pack years: 15.00    Types: Cigarettes    Last attempt to quit: 07/05/1976    Years since quitting: 42.2  . Smokeless tobacco: Never Used  Substance and Sexual Activity  . Alcohol use: No    Alcohol/week: 3.0 standard drinks    Types: 3 Glasses of wine per week    Frequency: Never    Comment: "not anymore, I haven't in I don't know years"  . Drug use: No  . Sexual activity: Never  Lifestyle  . Physical activity:    Days per week: Not on file    Minutes per session: Not on file  . Stress: Not on file  Relationships  . Social connections:    Talks on phone: Not on file    Gets together: Not on file    Attends religious service: Not on file    Active member of club or organization: Not on file    Attends meetings of clubs or organizations: Not on file    Relationship status: Not on file  Other Topics Concern  . Not on file  Social History Narrative   Patient is married [name]) and lives at home with her husband.   Patient has three adult children.   Patient is retired.   Patient has a college education.   Patient is right-handed.   Patient does not drink any caffeine.    Family History  Problem Relation Age of Onset  . Heart disease Sister   . Heart attack Sister   . Colon cancer Neg Hx   . Breast cancer Neg Hx   . Celiac disease Neg Hx   . Cirrhosis Neg Hx   . Clotting disorder Neg Hx   . Colitis Neg Hx   . Colon polyps Neg Hx   . Crohn's disease Neg Hx   . Cystic fibrosis Neg Hx   . Diabetes Neg Hx   . Esophageal cancer Neg Hx   . Hemochromatosis Neg Hx   . Inflammatory bowel disease Neg Hx   . Irritable bowel syndrome Neg Hx   . Kidney disease Neg Hx   . Liver cancer Neg Hx   . Liver disease Neg Hx   . Ovarian cancer Neg Hx   . Pancreatic  cancer Neg Hx   . Prostate cancer Neg Hx   . Rectal cancer Neg Hx   . Stomach cancer Neg Hx   . Ulcerative colitis Neg Hx   .  Uterine cancer Neg Hx   . Wilson's disease Neg Hx     Review of Systems  Constitutional: Negative for chills and fever.  Respiratory: Positive for cough. Negative for shortness of breath and wheezing.   Cardiovascular: Positive for chest pain (occasional - does not want to see cardiology). Negative for palpitations and leg swelling.  Gastrointestinal: Negative for abdominal pain.  Neurological: Negative for light-headedness and headaches.  Psychiatric/Behavioral: Positive for sleep disturbance.       Objective:   Vitals:   09/29/18 1426  BP: 112/60  Pulse: 70  Resp: 16  Temp: 97.6 F (36.4 C)  SpO2: 98%   BP Readings from Last 3 Encounters:  09/29/18 112/60  09/11/18 114/64  08/14/18 130/64   Wt Readings from Last 3 Encounters:  09/29/18 102 lb 6.4 oz (46.4 kg)  09/11/18 95 lb (43.1 kg)  08/14/18 95 lb 12.8 oz (43.5 kg)   Body mass index is 19.35 kg/m.   Physical Exam    Constitutional: Appears well-developed and well-nourished. No distress.  HENT:  Head: Normocephalic and atraumatic.  Neck: Neck supple. No tracheal deviation present. No thyromegaly present.  No cervical lymphadenopathy Cardiovascular: Normal rate, regular rhythm and normal heart sounds.   2/6 systolic murmur heard. No carotid bruit .  No edema Pulmonary/Chest: Effort normal and breath sounds normal. No respiratory distress. No has no wheezes. No rales.  Skin: Skin is warm and dry. Not diaphoretic.  Psychiatric: Normal mood and affect. Behavior is normal.   Diabetic Foot Exam - Simple   Simple Foot Form Diabetic Foot exam was performed with the following findings:  Yes 09/29/2018  3:04 PM  Visual Inspection No deformities, no ulcerations, no other skin breakdown bilaterally:  Yes Sensation Testing Intact to touch and monofilament testing bilaterally:  Yes Pulse Check Posterior Tibialis and Dorsalis pulse intact bilaterally:  Yes Comments       Assessment & Plan:    See Problem List  for Assessment and Plan of chronic medical problems.

## 2018-09-29 ENCOUNTER — Encounter: Payer: Self-pay | Admitting: Internal Medicine

## 2018-09-29 ENCOUNTER — Ambulatory Visit (INDEPENDENT_AMBULATORY_CARE_PROVIDER_SITE_OTHER): Payer: Medicare Other | Admitting: Internal Medicine

## 2018-09-29 VITALS — BP 112/60 | HR 70 | Temp 97.6°F | Resp 16 | Ht 61.0 in | Wt 102.4 lb

## 2018-09-29 DIAGNOSIS — R634 Abnormal weight loss: Secondary | ICD-10-CM

## 2018-09-29 DIAGNOSIS — F419 Anxiety disorder, unspecified: Secondary | ICD-10-CM | POA: Diagnosis not present

## 2018-09-29 DIAGNOSIS — F3289 Other specified depressive episodes: Secondary | ICD-10-CM | POA: Diagnosis not present

## 2018-09-29 DIAGNOSIS — I200 Unstable angina: Secondary | ICD-10-CM

## 2018-09-29 DIAGNOSIS — E1165 Type 2 diabetes mellitus with hyperglycemia: Secondary | ICD-10-CM | POA: Diagnosis not present

## 2018-09-29 DIAGNOSIS — E039 Hypothyroidism, unspecified: Secondary | ICD-10-CM | POA: Diagnosis not present

## 2018-09-29 DIAGNOSIS — Z23 Encounter for immunization: Secondary | ICD-10-CM | POA: Diagnosis not present

## 2018-09-29 DIAGNOSIS — K219 Gastro-esophageal reflux disease without esophagitis: Secondary | ICD-10-CM

## 2018-09-29 DIAGNOSIS — I10 Essential (primary) hypertension: Secondary | ICD-10-CM | POA: Diagnosis not present

## 2018-09-29 MED ORDER — QUETIAPINE FUMARATE 25 MG PO TABS: 25.0000 mg | ORAL_TABLET | Freq: Every day | ORAL | 5 refills | Status: DC

## 2018-09-29 NOTE — Patient Instructions (Addendum)
Decrease remeron to 1/2 pill or 15 mg nightly for one week and then stop.  After you stop that start seroquel 25 mg at night.    Please call with an update in a couple of week if this is helping your sleep.   Continue your other medications without change.     Have blood work done today.    Flu immunization administered today.    Your prescription(s) have been submitted to your pharmacy. Please take as directed and contact our office if you believe you are having problem(s) with the medication(s).   Please followup in 3 months

## 2018-10-01 NOTE — Assessment & Plan Note (Signed)
GERD controlled Continue daily medication  

## 2018-10-01 NOTE — Assessment & Plan Note (Signed)
Taking metformin We will consider decreasing dose depending on A1c Check A1c today

## 2018-10-01 NOTE — Assessment & Plan Note (Signed)
She has gained weight since her last visit Encouraged her to continue with increased eating-this may have been side effect from the Remeron We will continue to monitor her weight

## 2018-10-01 NOTE — Assessment & Plan Note (Signed)
Due for follow-up TSH after medication change Check TSH today

## 2018-10-01 NOTE — Assessment & Plan Note (Signed)
Controlled, stable Continue current dose of medication  

## 2018-10-01 NOTE — Assessment & Plan Note (Signed)
BP well controlled Current regimen effective and well tolerated Continue current medications at current doses cmp  

## 2018-10-11 DIAGNOSIS — H353211 Exudative age-related macular degeneration, right eye, with active choroidal neovascularization: Secondary | ICD-10-CM | POA: Diagnosis not present

## 2018-10-12 ENCOUNTER — Other Ambulatory Visit: Payer: Self-pay | Admitting: Internal Medicine

## 2018-10-14 ENCOUNTER — Other Ambulatory Visit: Payer: Self-pay | Admitting: Internal Medicine

## 2018-10-16 ENCOUNTER — Other Ambulatory Visit: Payer: Self-pay | Admitting: Internal Medicine

## 2018-10-19 ENCOUNTER — Other Ambulatory Visit: Payer: Self-pay | Admitting: Internal Medicine

## 2018-10-24 ENCOUNTER — Ambulatory Visit: Payer: Medicare Other | Admitting: Internal Medicine

## 2018-10-26 ENCOUNTER — Ambulatory Visit (INDEPENDENT_AMBULATORY_CARE_PROVIDER_SITE_OTHER): Payer: Medicare Other | Admitting: Cardiovascular Disease

## 2018-10-26 ENCOUNTER — Encounter: Payer: Self-pay | Admitting: Cardiovascular Disease

## 2018-10-26 VITALS — BP 102/64 | HR 74 | Ht 61.0 in | Wt 102.4 lb

## 2018-10-26 DIAGNOSIS — I200 Unstable angina: Secondary | ICD-10-CM

## 2018-10-26 DIAGNOSIS — E782 Mixed hyperlipidemia: Secondary | ICD-10-CM | POA: Diagnosis not present

## 2018-10-26 DIAGNOSIS — I25119 Atherosclerotic heart disease of native coronary artery with unspecified angina pectoris: Secondary | ICD-10-CM

## 2018-10-26 DIAGNOSIS — I10 Essential (primary) hypertension: Secondary | ICD-10-CM

## 2018-10-26 NOTE — Progress Notes (Signed)
Cardiology Office Note:    Date:  10/26/2018   ID:  Janet Steele, DOB [birth year], MRN [id number]  PCP:  Binnie Rail, MD  Cardiologist:  Sherren Mocha, MD  Electrophysiologist:  None   Referring MD: Binnie Rail, MD   Chief Complaint  Patient presents with  . Coronary Artery Disease    History of Present Illness:    Janet Steele is a 84 y.o. female with a hx of CAD, presenting for follow-up evaluation.   She has a history of CAD (CABG 1992, redo 2003), chronic angina, HLD, DM2, symptomatic bradycardia s/p Medtronic PPM, hypothyroidism, anxiety, HTN, remote tobacco abuse and arthritis.  Janet Steele is here alone today.  She is lost a lot of weight since have seen her last.  She had a stroke about 1 year ago and has had a tough time since then.  She is right-handed and has residual deficits in the right arm.  She also has difficulty walking well.  She has not had any chest pain or pressure recently.  She denies shortness of breath, leg swelling, orthopnea, or PND.  She is compliant with her medications and she tolerates dual antiplatelet therapy with aspirin and clopidogrel.  Past Medical History:  Diagnosis Date  . Anemia   . Anxiety   . Arthritis    "fingers" (05/19/2016  . CAD (coronary artery disease)    a. s/p CABG 1982 b. redo CABG 2003. c. [place] despite med rx 05/2016: successful but complicated PCI of PDA beyond graft site, c/b localized dissection requiring overlapping stent.  . Carotid bruit    a. Duplex 01/2015: patent vessels, 1-39% BICA, f/u PRN recommened.  . Colon polyp    adenomatous  . Colon, diverticulosis   . Degenerative joint disease   . Diastolic dysfunction   . Esophageal stricture   . GERD (gastroesophageal reflux disease)   . Hiatal hernia   . History of blood transfusion    "related to my bypass"  . Hyperlipidemia   . Hypertension   . Hypothyroidism   . Ischemic cardiomyopathy    a. Cath 05/2016: EF 40% with severe inferior wall HK, suspect  hibernating myocardium.  . Myocardial infarction ([place]) 1977; 1982  . Osteoarthrosis, unspecified whether generalized or localized, unspecified site   . Presence of permanent cardiac pacemaker   . PVD (peripheral vascular disease) ([place])   . Type II diabetes mellitus ([place])   . Unspecified hearing loss     Past Surgical History:  Procedure Laterality Date  . ABDOMINAL HYSTERECTOMY    . APPENDECTOMY    . BACK SURGERY    . CARDIAC CATHETERIZATION N/A 05/19/2016   Procedure: Left Heart Cath and Cors/Grafts Angiography;  Surgeon: Belva Crome, MD;  Location: [facility];  Service: Cardiovascular;  Laterality: N/A;  . CARDIAC CATHETERIZATION N/A 05/19/2016   Procedure: Coronary Stent Intervention;  Surgeon: Belva Crome, MD;  Location: [facility];  Service: Cardiovascular;  Laterality: N/A;  . CATARACT EXTRACTION W/ INTRAOCULAR LENS  IMPLANT, BILATERAL Bilateral   . CHOLECYSTECTOMY OPEN    . COLONOSCOPY    . CORONARY ANGIOPLASTY WITH STENT PLACEMENT  05/19/2016   "2 stents?"  . CORONARY ARTERY BYPASS GRAFT  1982; 2003   x4(1982),02-2002 CABG X2  . ESOPHAGOGASTRODUODENOSCOPY (EGD) WITH ESOPHAGEAL DILATION  "2-3 times"  . INSERT / REPLACE / REMOVE PACEMAKER    . LEFT HEART CATHETERIZATION WITH CORONARY/GRAFT ANGIOGRAM N/A 03/05/2014   Procedure: LEFT HEART CATHETERIZATION WITH CORONARY/GRAFT ANGIOGRAM;  Surgeon: Sinclair Grooms, MD;  Location: [facility] CATH LAB;  Service: Cardiovascular;  Laterality: N/A;  . LUMBAR [place] SURGERY  01-2000  . OOPHORECTOMY Bilateral   . PERMANENT PACEMAKER INSERTION N/A 07/06/2012   MDT Adapta L implanted by Dr Rayann Heman for symptomatic bradycardia  . TONSILLECTOMY  1930s    Current Medications: Current Meds  Medication Sig  . acetaminophen (TYLENOL) 500 MG tablet Take 2 tablets (1,000 mg total) by mouth every 6 (six) hours as needed.  Marland Kitchen atorvastatin (LIPITOR) 80 MG tablet TAKE 1 TABLET BY MOUTH ONCE DAILY  . clopidogrel (PLAVIX) 75 MG tablet TAKE 1  TABLET DAILY  . DULoxetine (CYMBALTA) 30 MG capsule Take 1 capsule (30 mg total) by mouth daily.  Marland Kitchen levothyroxine (SYNTHROID, LEVOTHROID) 88 MCG tablet Take 1 tablet (88 mcg total) by mouth daily.  Marland Kitchen losartan (COZAAR) 25 MG tablet TAKE 1 TABLET(25 MG) BY MOUTH DAILY  . metFORMIN (GLUCOPHAGE) 1000 MG tablet TAKE 1 TABLET BY MOUTH TWICE DAILY WITH MEALS  . metoprolol tartrate (LOPRESSOR) 25 MG tablet TAKE 1 TABLET BY MOUTH TWICE DAILY  . nitroGLYCERIN (NITROSTAT) 0.4 MG SL tablet Place 1 tablet (0.4 mg total) under the tongue every 5 (five) minutes as needed. For chest pain  . pantoprazole (PROTONIX) 40 MG tablet TAKE 1 TABLET BY MOUTH TWICE A DAY  . QUEtiapine (SEROQUEL) 25 MG tablet Take 1 tablet (25 mg total) by mouth at bedtime.  Marland Kitchen RA ASPIRIN EC 81 MG EC tablet take 1 tablet by mouth once daily . OVERDUE FOR FOLLOW UP. PLEASE CALL AND SCHEDULE [phone].     Allergies:   Morphine; Penicillins; Prednisone; Sulfonamide derivatives; Codeine; Hydrocodone-acetaminophen; Meperidine hcl; Metoclopramide hcl; Metoprolol succinate; Moxifloxacin; Oxycodone-aspirin; Pentazocine lactate; and Pioglitazone   Social History   Socioeconomic History  . Marital status: Married    Spouse name: [name]  . Number of children: 3  . Years of education: 49  . Highest education level: Not on file  Occupational History  . Occupation: Retired     Fish farm manager: RETIRED  Social Needs  . Financial resource strain: Not on file  . Food insecurity:    Worry: Not on file    Inability: Not on file  . Transportation needs:    Medical: Not on file    Non-medical: Not on file  Tobacco Use  . Smoking status: Former Smoker    Packs/day: 0.50    Years: 30.00    Pack years: 15.00    Types: Cigarettes    Last attempt to quit: 07/05/1976    Years since quitting: 42.3  . Smokeless tobacco: Never Used  Substance and Sexual Activity  . Alcohol use: No    Alcohol/week: 3.0 standard drinks    Types: 3 Glasses of wine per  week    Frequency: Never    Comment: "not anymore, I haven't in I don't know years"  . Drug use: No  . Sexual activity: Never  Lifestyle  . Physical activity:    Days per week: Not on file    Minutes per session: Not on file  . Stress: Not on file  Relationships  . Social connections:    Talks on phone: Not on file    Gets together: Not on file    Attends religious service: Not on file    Active member of club or organization: Not on file    Attends meetings of clubs or organizations: Not on file    Relationship status: Not on file  Other  Topics Concern  . Not on file  Social History Narrative   Patient is married [name]) and lives at home with her husband.   Patient has three adult children.   Patient is retired.   Patient has a college education.   Patient is right-handed.   Patient does not drink any caffeine.     Family History: The patient's family history includes Heart attack in her sister; Heart disease in her sister. There is no history of Colon cancer, Breast cancer, Celiac disease, Cirrhosis, Clotting disorder, Colitis, Colon polyps, Crohn's disease, Cystic fibrosis, Diabetes, Esophageal cancer, Hemochromatosis, Inflammatory bowel disease, Irritable bowel syndrome, Kidney disease, Liver cancer, Liver disease, Ovarian cancer, Pancreatic cancer, Prostate cancer, Rectal cancer, Stomach cancer, Ulcerative colitis, Uterine cancer, or Wilson's disease.  ROS:   Please see the history of present illness.    Positive for hearing loss, cough, easy bruising.  All other systems reviewed and are negative.  EKGs/Labs/Other Studies Reviewed:    The following studies were reviewed today: Echo 11-04-2017: Study Conclusions  - Left ventricle: The cavity size was normal. Wall thickness was   normal. Systolic function was normal. The estimated ejection   fraction was in the range of 60% to 65%. Wall motion was normal;   there were no regional wall motion abnormalities.  Doppler   parameters are consistent with abnormal left ventricular   relaxation (grade 1 diastolic dysfunction). - Aortic valve: Trileaflet; mildly thickened, mildly calcified   leaflets. - Right ventricle: Pacer wire or catheter noted in right ventricle.  Impressions:  - No cardiac source of emboli was indentified. Compared to the   prior study, there has been no significant interval change.  EKG:  EKG is not ordered today.    Recent Labs: 07/24/2018: ALT 9; BUN 14; Creatinine, Ser 0.90; Hemoglobin 10.9; Platelets 258.0; Potassium 4.2; Sodium 138; TSH 0.03  Recent Lipid Panel    Component Value Date/Time   CHOL 112 07/24/2018 1433   TRIG 76.0 07/24/2018 1433   HDL 29.80 (L) 07/24/2018 1433   CHOLHDL 4 07/24/2018 1433   VLDL 15.2 07/24/2018 1433   LDLCALC 67 07/24/2018 1433   LDLDIRECT 254.0 11/01/2017 1702    Physical Exam:    VS:  BP 102/64   Pulse 74   Ht 5\' 1"  (1.549 m)   Wt 102 lb 6.4 oz (46.4 kg)   SpO2 98%   BMI 19.35 kg/m     Wt Readings from Last 3 Encounters:  10/26/18 102 lb 6.4 oz (46.4 kg)  09/29/18 102 lb 6.4 oz (46.4 kg)  09/11/18 95 lb (43.1 kg)     GEN: Thin elderly woman in no acute distress HEENT: Normal NECK: No JVD; No carotid bruits LYMPHATICS: No lymphadenopathy CARDIAC: RRR, no murmurs, rubs, gallops RESPIRATORY:  Clear to auscultation without rales, wheezing or rhonchi  ABDOMEN: Soft, non-tender, non-distended MUSCULOSKELETAL:  No edema; No deformity  SKIN: Warm and dry NEUROLOGIC:  Alert and oriented x 3 PSYCHIATRIC:  Normal affect   ASSESSMENT:    1. Coronary artery disease involving native coronary artery of native heart with angina pectoris ([place])   2. Mixed hyperlipidemia   3. Essential hypertension    PLAN:    In order of problems listed above:  1. The patient is stable on her current medical program.  She continues on dual antiplatelet therapy with aspirin and Plavix.  She is on a high intensity statin drug.  Her  antianginal program only includes metoprolol at this time.  She appears  stable without any active anginal symptoms. 2. Treated with a high intensity statin drug. 3. Blood pressure is well controlled on metoprolol and losartan.  Medication Adjustments/Labs and Tests Ordered: Current medicines are reviewed at length with the patient today.  Concerns regarding medicines are outlined above.  No orders of the defined types were placed in this encounter.  No orders of the defined types were placed in this encounter.   Patient Instructions  Medication Instructions:  Your provider recommends that you continue on your current medications as directed. Please refer to the Current Medication list given to you today.    Labwork: None  Testing/Procedures: None  Follow-Up: Your provider wants you to follow-up in: 1 year with Dr. Burt Knack. You will receive a reminder letter in the mail two months in advance. If you don't receive a letter, please call our office to schedule the follow-up appointment.    Any Other Special Instructions Will Be Listed Below (If Applicable).     If you need a refill on your cardiac medications before your next appointment, please call your pharmacy.      Signed, Sherren Mocha, MD  10/26/2018 3:49 PM    [place]

## 2018-10-26 NOTE — Patient Instructions (Signed)

## 2018-10-30 ENCOUNTER — Other Ambulatory Visit (INDEPENDENT_AMBULATORY_CARE_PROVIDER_SITE_OTHER): Payer: Medicare Other

## 2018-10-30 ENCOUNTER — Telehealth: Payer: Self-pay | Admitting: Internal Medicine

## 2018-10-30 DIAGNOSIS — R300 Dysuria: Secondary | ICD-10-CM

## 2018-10-30 LAB — URINALYSIS
Bilirubin Urine: NEGATIVE
Hgb urine dipstick: NEGATIVE
KETONES UR: NEGATIVE
Leukocytes, UA: NEGATIVE
Nitrite: NEGATIVE
PH: 5.5 (ref 5.0–8.0)
Specific Gravity, Urine: 1.02 (ref 1.000–1.030)
TOTAL PROTEIN, URINE-UPE24: NEGATIVE
URINE GLUCOSE: NEGATIVE
Urobilinogen, UA: 0.2 (ref 0.0–1.0)

## 2018-10-30 NOTE — Telephone Encounter (Signed)
Pt husband called and stated he is going to bring a sample to the office as soon has he can collect one from her.  He want to bring it over today.

## 2018-10-30 NOTE — Telephone Encounter (Signed)
Would you prefer OV for pt?

## 2018-10-30 NOTE — Telephone Encounter (Signed)
She needs her urine checked-she does have frequent urinary tract infections, but every time her urine has been checked it has not been an infection and we do not want to give an antibiotic if it is not necessary because this could cause more harm than good.  If they cannot get in for a visit today with someone can drop off a urine sample.

## 2018-10-30 NOTE — Telephone Encounter (Signed)
Labs ordered for UA and UC

## 2018-10-30 NOTE — Telephone Encounter (Signed)
Copied from [place] [id number]. Topic: Quick Communication - See Telephone Encounter >> Oct 30, 2018  9:51 AM Antonieta Iba C wrote: CRM for notification. See Telephone encounter for: 10/30/18.   Pt's spouse called in to request a Rx for pt. Spouse says that pt is frequently urinating with no other concerns. Spouse says that this is a common concern for pt so PCP is aware.    CB: [phone]   Pharmacy: [facility] [place], [place] - [place]

## 2018-10-31 LAB — URINE CULTURE
MICRO NUMBER: 91418353
SPECIMEN QUALITY:: ADEQUATE

## 2018-10-31 NOTE — Telephone Encounter (Signed)
Pt's spouse has called back in to follow up on result from culture. Spouse says that he would like for this to be handled as prompt as possible considering the holidays are approaching. He feels that he/they have not been receiving communication.   I did advise per PCP notes and made pt/spouse aware that PCP is actively awaiting results and that someone will call them as soon as they are received to advise further.   I apologized for his concern, spouse expressed understanding, stating that he will just await for the results.

## 2018-11-01 NOTE — Telephone Encounter (Signed)
Spoke with husband and advised of lab results. He expressed understanding.

## 2018-11-15 DIAGNOSIS — H353211 Exudative age-related macular degeneration, right eye, with active choroidal neovascularization: Secondary | ICD-10-CM | POA: Diagnosis not present

## 2018-11-29 IMAGING — CT CT ANGIO HEAD
1 of 13 series · 4 of 35 positions shown · IV contrast (OMNI 350)
Comparison: Prior CT from 11/03/2017.

CLINICAL DATA: Follow-up examination for acute stroke, right-sided
weakness.

EXAM:
CT ANGIOGRAPHY HEAD AND NECK
TECHNIQUE: Multidetector CT imaging of the head and neck was performed using
the standard protocol during bolus administration of intravenous
contrast. Multiplanar CT image reconstructions and MIPs were
obtained to evaluate the vascular anatomy. Carotid stenosis
measurements (when applicable) are obtained utilizing NASCET
criteria, using the distal internal carotid diameter as the
denominator.
CONTRAST:  50mL S6XMQ5-UTF IOPAMIDOL (S6XMQ5-UTF) INJECTION 76%

[Series 11: cta neck axial · axial · 0.39mm/px · z∈[+1047,+1233]mm · 4 of 311 slices shown]
[im 63/311  soft-tissue]
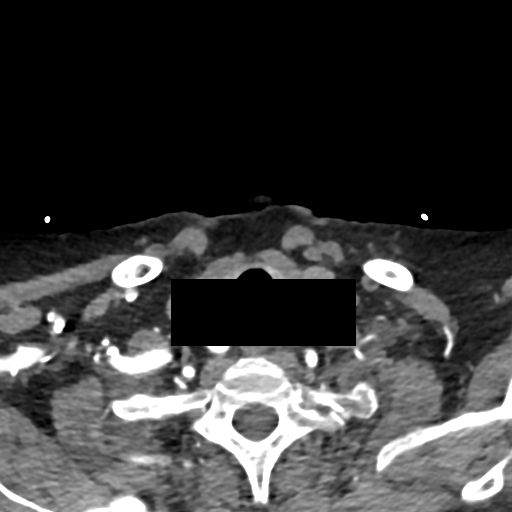
[im 125/311  bone]
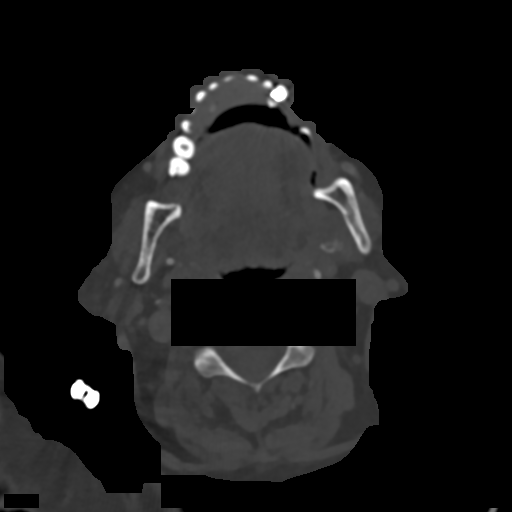
[im 187/311  soft-tissue]
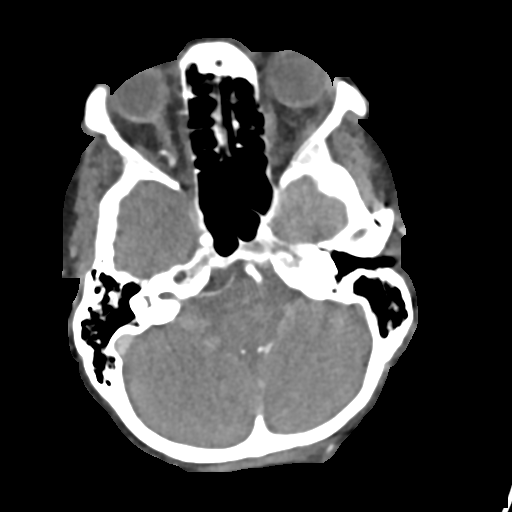
[im 249/311  bone]
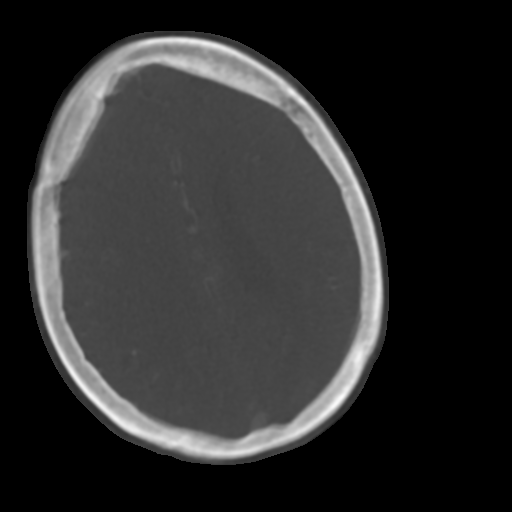

[4 of 35 positions shown; findings below may reference images not displayed]

FINDINGS: CT HEAD FINDINGS

Brain: Stable atrophy with chronic small vessel ischemic disease.
Remote lacunar infarct again noted within the anterior left centrum
semi ovale.

Curvilinear hypodensity extending from the posterior left lentiform
nucleus towards the left corona radiata noted, most likely reflects
an evolving acute ischemic perforator type infarct (series 5, image
17). No associated hemorrhage or mass effect.

No other acute intracranial hemorrhage. No other acute or evolving
large vessel territory infarct. No mass lesion, midline shift or
mass effect. No hydrocephalus. No extra-axial fluid collection.

Vascular: No hyperdense vessel. Scattered vascular calcifications
noted within the carotid siphons.

Skull: Scalp soft tissues and calvarium within normal limits.

Sinuses: Visualized paranasal sinuses are largely clear. Mastoid air
cells are clear.

Orbits: Globes and orbital soft tissues within normal limits.
Patient status post lens extraction bilaterally.

Review of the MIP images confirms the above findings

CTA NECK FINDINGS

Aortic arch: Visualized aortic arch of normal caliber with normal 3
vessel morphology. Scattered atheromatous plaque throughout the
visualize arch and proximal great vessels. No hemodynamically
significant stenosis at the origin of the great vessels. Visualized
subclavian artery is patent without flow-limiting stenosis.

Right carotid system: The right common carotid artery tortuous
proximally but patent to the bifurcation without flow-limiting
stenosis. Eccentric calcified plaque about the origin of the right
ICA and associated short-segment stenosis of up to 50% by NASCET
criteria. Right ICA otherwise widely patent distally to the
skullbase without stenosis, dissection, or occlusion.

Left carotid system: Left common carotid artery mildly tortuous
proximally but is widely patent to the bifurcation without stenosis.
Prominent calcified plaque about the left carotid
bifurcation/proximal left ICA. Associated short-segment stenosis of
up to 65% by NASCET criteria. Left ICA otherwise patent to the
skullbase without stenosis, dissection, or occlusion.

Vertebral arteries: Both of the vertebral artery is arise from the
subclavian arteries. Vertebral artery is largely code dominant.
Focal plaque at the origin of the right vertebral artery with
relatively mild stenosis. Vertebral arteries otherwise widely patent
within the neck without stenosis, dissection, or occlusion.

Skeleton: No acute osseus abnormality. No worrisome lytic or blastic
osseous lesions. Moderate degenerative spondylolysis noted at C4-5
through C6-7.

Other neck: No acute soft tissue abnormality identified within the
neck. No adenopathy. Salivary glands within normal limits. Thyroid
appears to be absent.

Upper chest: Visualized upper mediastinum within normal limits.
Left-sided pacemaker/ AICD partially visualized. Median sternotomy
wires noted. Partially visualized lungs are clear.

Review of the MIP images confirms the above findings

CTA HEAD FINDINGS

Anterior circulation: Examination severely limited by extensive
motion artifact. Petrous ICAs patent bilaterally without definite
stenosis. Calcified atheromatous plaque seen diffusely throughout
the cavernous/supraclinoid ICAs without obvious flow-limiting
stenosis, although evaluation limited by motion artifact. ICA
termini grossly patent bilaterally. Grossly patent A1 segments.
Anterior communicating artery not well evaluated due to motion.
Anterior cerebral arteries grossly patent to their distal aspects
with symmetric enhancement. M1 segments patent bilaterally without
obvious flow-limiting stenosis. Grossly normal MCA bifurcations. No
obvious proximal M2 occlusion. Distal MCA branches well perfused
bilaterally and grossly symmetric.

Posterior circulation: Mild atheromatous regularity within the V4
segments bilaterally without flow-limiting stenosis. Posterior
inferior cerebral artery is patent bilaterally. Basilar artery
widely patent proximally, poorly evaluated at its mid and distal
aspect due to motion artifact. No obvious flow-limiting stenosis.
Superior cerebral arteries patent bilaterally. Evaluation of the
PCAs markedly limited by motion, although are grossly patent to
their distal aspects without occlusion or obvious stenosis.

Venous sinuses: Grossly patent, although evaluation markedly limited
by motion.

Anatomic variants: None significant.

Delayed phase: No abnormal enhancement.

Review of the MIP images confirms the above findings
IMPRESSION: 1. Probable evolving acute perforator type infarct involving the
posterior left lentiform nucleus/corona radiata. No associated
hemorrhage or mass effect.
2. Atheromatous plaque about the carotid bifurcations bilaterally.
Associated stenosis of up to 50% at the origin of the right ICA, and
up to 65% at the origin of the left ICA.
3. Severely limited evaluation of the intracranial circulation due
to extensive motion artifact. No large vessel occlusion. No obvious
high-grade or flow-limiting stenosis.

## 2018-12-01 IMAGING — RF DG SWALLOWING FUNCTION - NRPT [facility]
1 series · 18 of 24 positions shown · non-contrast
Comparison: none

[Series 1: run · 14 acquisitions, 18 frames shown]
[im 1/14]
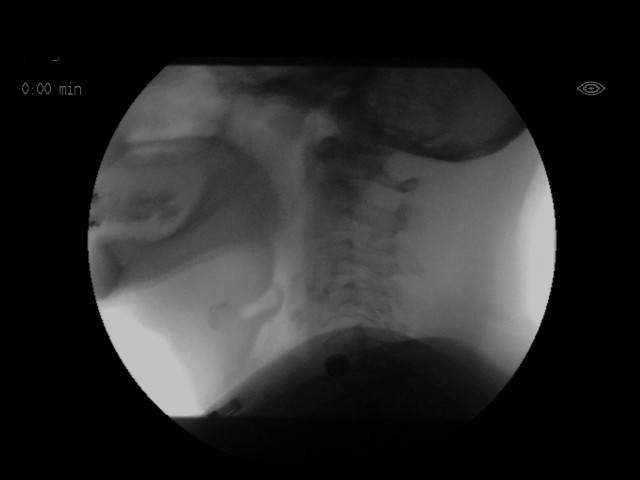
[im 2/14]
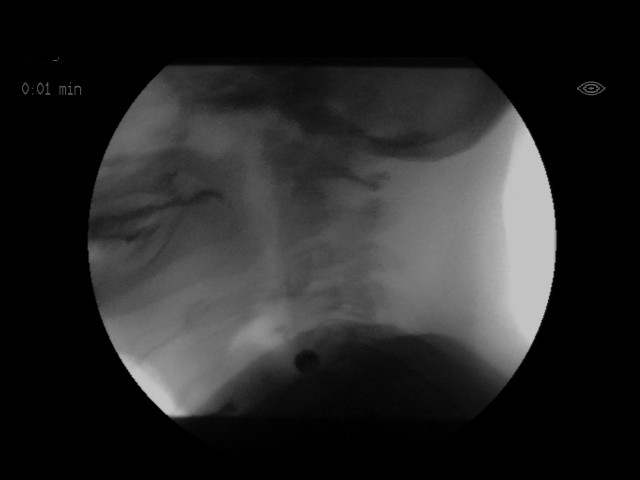
[im 2/14]
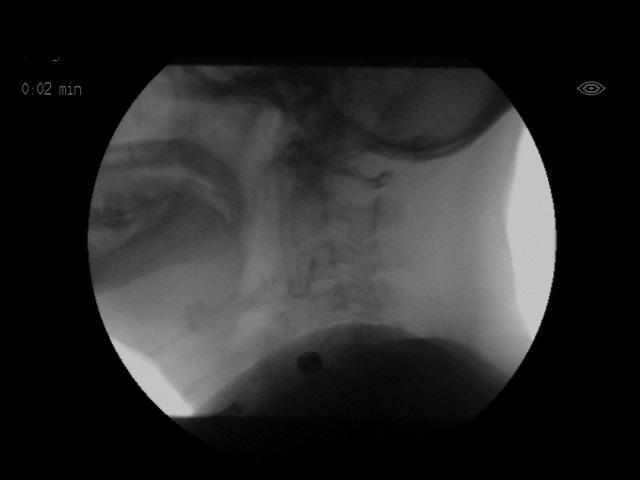
[im 3/14]
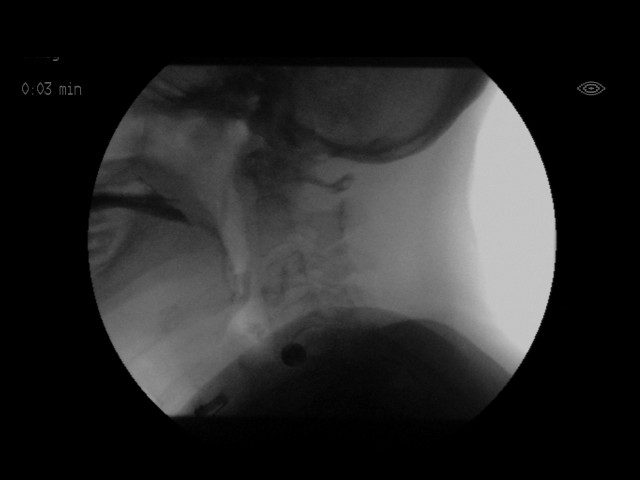
[im 4/14]
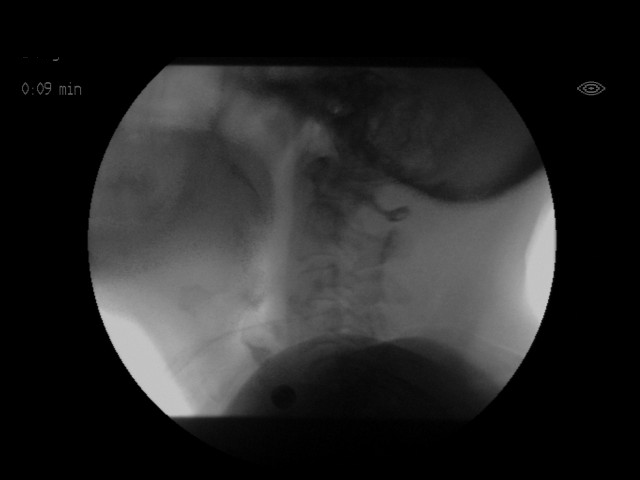
[im 5/14]
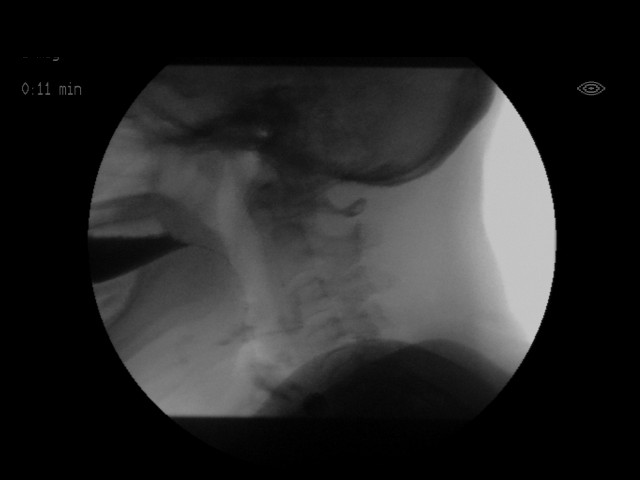
[im 5/14]
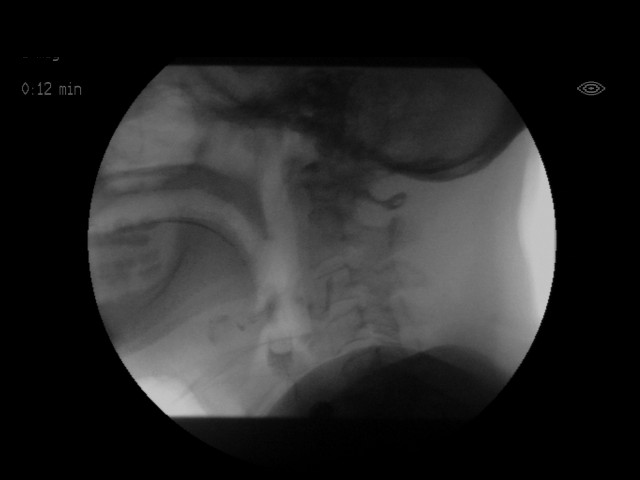
[im 6/14]
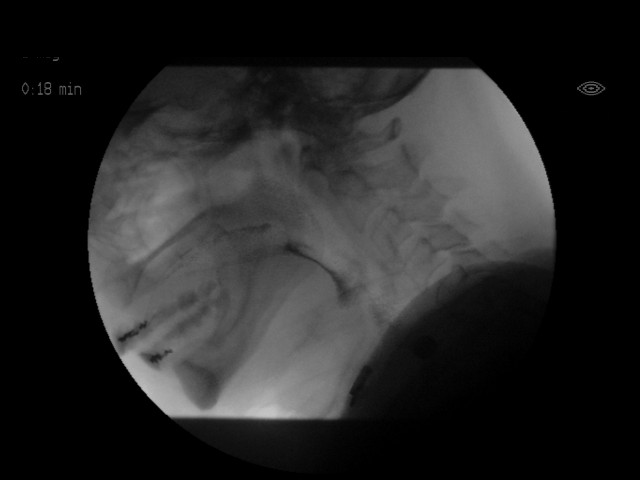
[im 7/14]
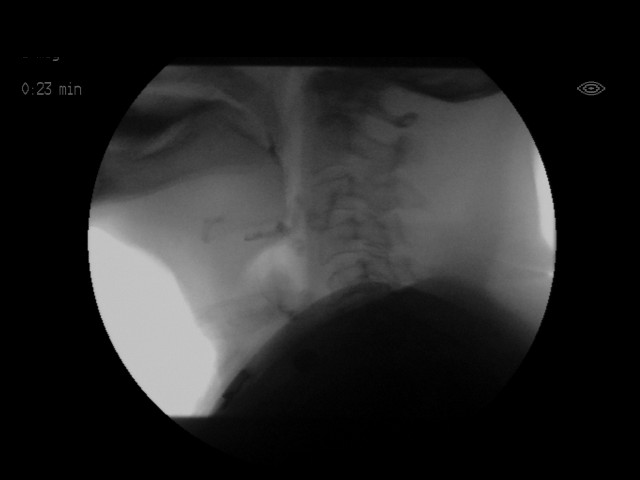
[im 8/14]
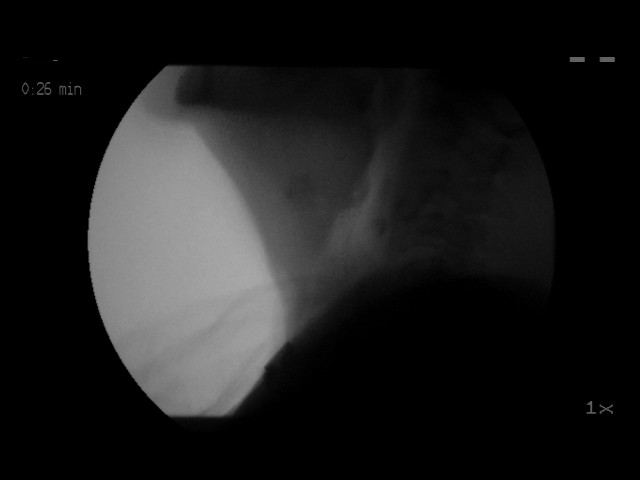
[im 9/14]
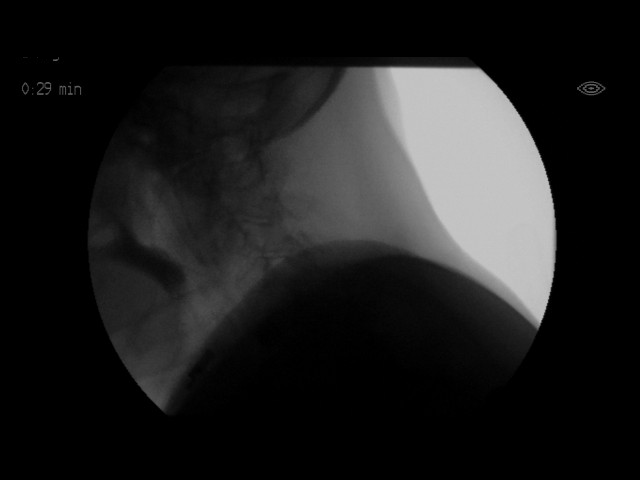
[im 9/14]
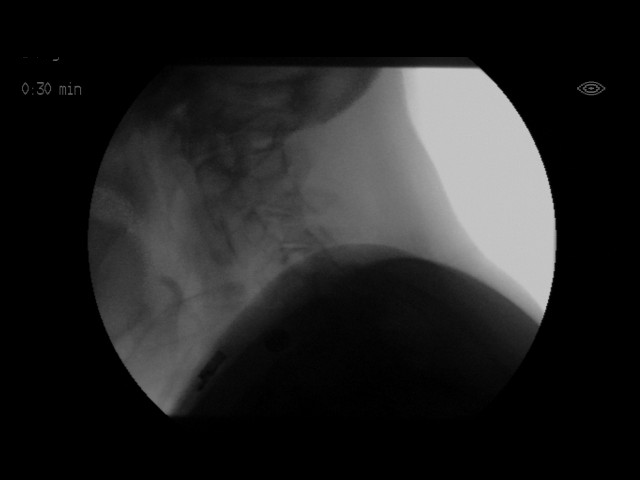
[im 10/14]
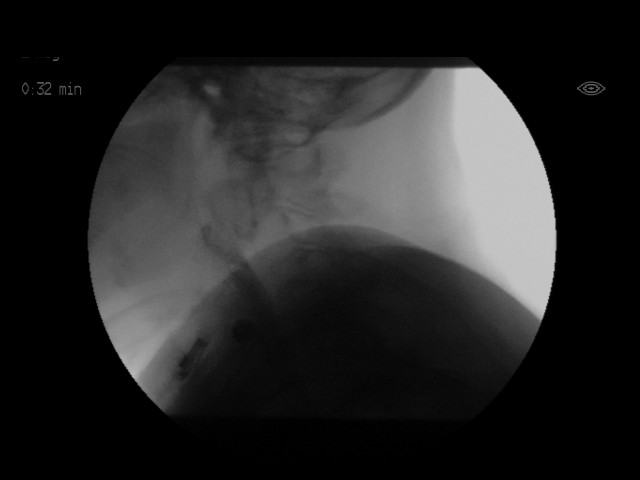
[im 11/14]
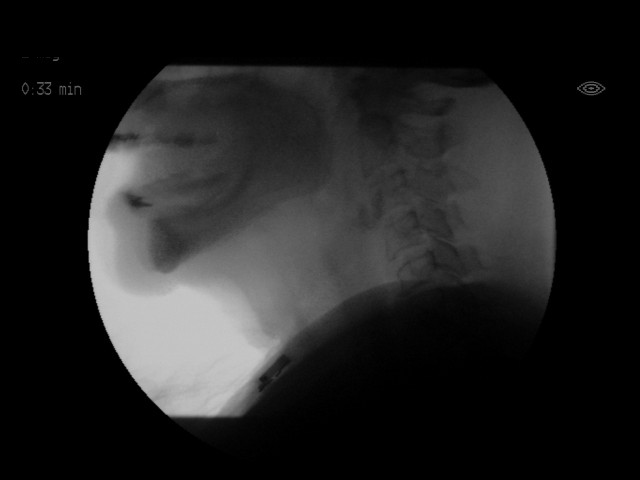
[im 12/14]
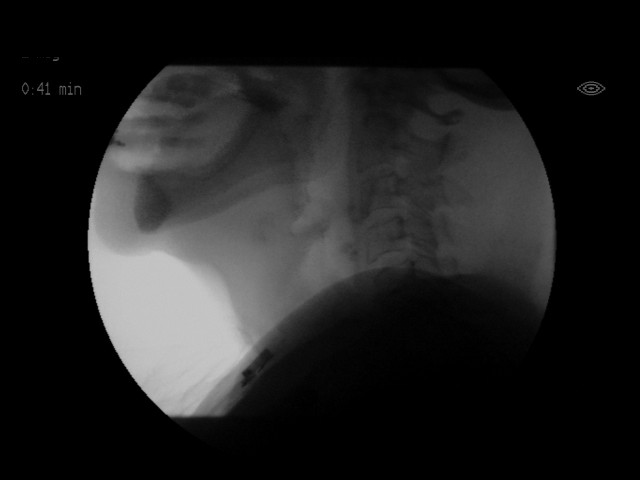
[im 12/14]
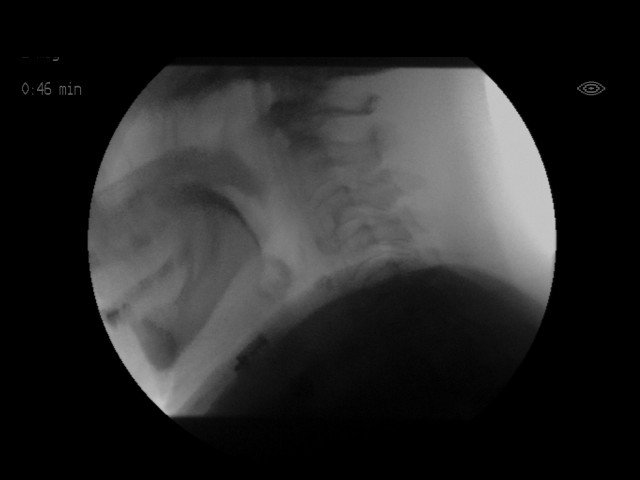
[im 14/14]
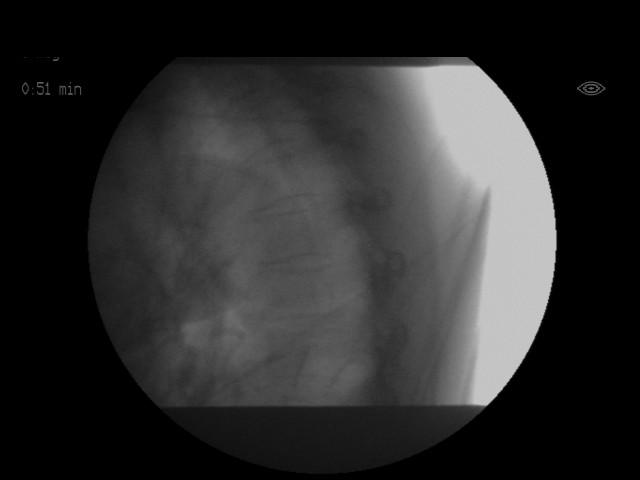
[im 14/14]
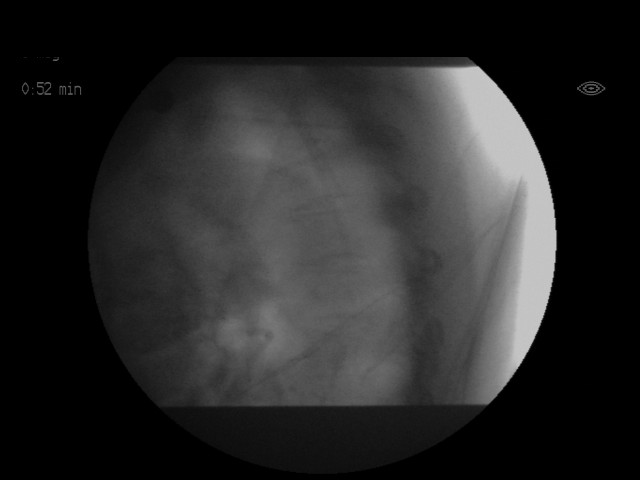

[18 of 24 positions shown; findings below may reference images not displayed]

FLUOROSCOPY FOR SWALLOWING FUNCTION STUDY:
Fluoroscopy was provided for swallowing function study, which was administered by a speech pathologist.  Final results and recommendations from this study are contained within the speech pathology report.

## 2018-12-20 ENCOUNTER — Other Ambulatory Visit: Payer: Self-pay | Admitting: Internal Medicine

## 2018-12-20 DIAGNOSIS — I25700 Atherosclerosis of coronary artery bypass graft(s), unspecified, with unstable angina pectoris: Secondary | ICD-10-CM

## 2018-12-20 DIAGNOSIS — I200 Unstable angina: Secondary | ICD-10-CM

## 2019-01-01 NOTE — Progress Notes (Signed)
Subjective:    Patient ID: Janet Steele, female    DOB: 1929-05-10, 85 y.o.   MRN: [id number]  HPI The patient is here for follow up.  She is here alone.  Diabetes: She is taking her medication daily as prescribed. She is compliant with a diabetic diet. She is not exercising regularly.   Weight loss:  She is eating well.  She has gained two lbs since she was here last. She is concerned about gaining weight.   Hypertension: She is taking her medication daily. She is compliant with a low sodium diet.  She denies chest pain, palpitations, edema, shortness of breath and regular headaches. She is exercising regularly.      Hyperlipidemia: She is taking her medication daily. She is compliant with a low fat/cholesterol diet. She is not exercising regularly, but walks some. She denies myalgias.   Hypothyroidism:  She is taking her medication daily.  She denies any recent changes in energy or weight that are unexplained.   H/o CVA with right sided weakness:  She is very frustrated by not being able to walk w/o a walker - she has 5 of them at home.    GERD:  She is taking her medication daily as prescribed.  She denies any GERD symptoms and feels her GERD is well controlled.   Insomnia:  She states she is not sleeping well, but has always said that.    Anxiety, Depression: She is taking her medication daily as prescribed. She denies any side effects from the medication. She feels her depression is well controlled.  She feels her anxiety is controlled.     Medications and allergies reviewed with patient and updated if appropriate.  Patient Active Problem List   Diagnosis Date Noted  . Other headache syndrome 08/14/2018  . Depression 08/14/2018  . Weight loss 06/05/2018  . Stroke ([place]) 11/04/2017  . Weakness 11/03/2017  . History of cerebrovascular accident (CVA) with residual deficit 11/03/2017  . Chronic back pain 07/04/2017  . PAD (peripheral artery disease) ([place]) 01/17/2017  .  Overactive bladder 12/17/2016  . Anemia 05/20/2016  . Coronary artery disease involving coronary bypass graft of native heart without angina pectoris 05/19/2016  . Numbness and tingling of foot 12/31/2015  . MCI (mild cognitive impairment) with memory loss 11/18/2015  . Facial tic 05/07/2015  . Sick sinus syndrome ([place]) 02/26/2015  . Cervical dystonia 12/04/2014  . Chronic motor tic 09/12/2014  . Unstable angina ([place]) 03/02/2014  . Pacemaker-Medtronic 07/07/2012  . GERD (gastroesophageal reflux disease) 07/05/2012  . Cough 08/04/2010  . Abnormal involuntary movement 12/17/2009  . Disturbance in sleep behavior 12/11/2009  . CAD, ARTERY BYPASS GRAFT 09/24/2009  . CAROTID BRUIT 09/24/2009  . Diabetes ([place]) 01/30/2009  . DYSPHAGIA 06/19/2008  . Anxiety 02/14/2008  . EROSIVE ESOPHAGITIS 02/14/2008  . CONSTIPATION, CHRONIC 02/14/2008  . DEGENERATIVE JOINT DISEASE 02/14/2008  . HEARING LOSS 01/04/2008  . PVD 01/04/2008  . Hypothyroidism 09/25/2007  . HYPERLIPIDEMIA 09/25/2007  . Essential hypertension 09/07/2007  . Diabetic polyneuropathy ([place]) 03/16/2007  . Stricture and stenosis of esophagus 03/16/2007  . HIATAL HERNIA 02/16/2007  . HEMORRHOIDS 12/31/1997  . DIVERTICULOSIS, COLON 12/31/1997    Current Outpatient Medications on File Prior to Visit  Medication Sig Dispense Refill  . acetaminophen (TYLENOL) 500 MG tablet Take 2 tablets (1,000 mg total) by mouth every 6 (six) hours as needed. 30 tablet 0  . atorvastatin (LIPITOR) 80 MG tablet TAKE 1 TABLET BY MOUTH ONCE DAILY 90  tablet 1  . clopidogrel (PLAVIX) 75 MG tablet TAKE 1 TABLET BY MOUTH ONCE DAILY 90 tablet 1  . DULoxetine (CYMBALTA) 30 MG capsule Take 1 capsule (30 mg total) by mouth daily. 14 capsule 0  . JANUVIA 50 MG tablet Take 50 mg by mouth daily.  0  . levothyroxine (SYNTHROID, LEVOTHROID) 88 MCG tablet Take 1 tablet (88 mcg total) by mouth daily. 90 tablet 3  . losartan (COZAAR) 25 MG tablet TAKE 1 TABLET(25 MG)  BY MOUTH DAILY 90 tablet 0  . metFORMIN (GLUCOPHAGE) 1000 MG tablet TAKE 1 TABLET BY MOUTH TWICE DAILY WITH MEALS 180 tablet 1  . metoprolol tartrate (LOPRESSOR) 25 MG tablet TAKE 1 TABLET BY MOUTH TWICE DAILY 180 tablet 1  . nitroGLYCERIN (NITROSTAT) 0.4 MG SL tablet Place 1 tablet (0.4 mg total) under the tongue every 5 (five) minutes as needed. For chest pain 25 tablet 5  . pantoprazole (PROTONIX) 40 MG tablet TAKE 1 TABLET BY MOUTH TWICE A DAY 180 tablet 0  . QUEtiapine (SEROQUEL) 25 MG tablet Take 1 tablet (25 mg total) by mouth at bedtime. 30 tablet 5  . RA ASPIRIN EC 81 MG EC tablet take 1 tablet by mouth once daily . OVERDUE FOR FOLLOW UP. PLEASE CALL AND SCHEDULE [phone]. 15 tablet 0   No current facility-administered medications on file prior to visit.     Past Medical History:  Diagnosis Date  . Anemia   . Anxiety   . Arthritis    "fingers" (05/19/2016  . CAD (coronary artery disease)    a. s/p CABG 1982 b. redo CABG 2003. c. [place] despite med rx 05/2016: successful but complicated PCI of PDA beyond graft site, c/b localized dissection requiring overlapping stent.  . Carotid bruit    a. Duplex 01/2015: patent vessels, 1-39% BICA, f/u PRN recommened.  . Colon polyp    adenomatous  . Colon, diverticulosis   . Degenerative joint disease   . Diastolic dysfunction   . Esophageal stricture   . GERD (gastroesophageal reflux disease)   . Hiatal hernia   . History of blood transfusion    "related to my bypass"  . Hyperlipidemia   . Hypertension   . Hypothyroidism   . Ischemic cardiomyopathy    a. Cath 05/2016: EF 40% with severe inferior wall HK, suspect hibernating myocardium.  . Myocardial infarction ([place]) 1977; 1982  . Osteoarthrosis, unspecified whether generalized or localized, unspecified site   . Presence of permanent cardiac pacemaker   . PVD (peripheral vascular disease) ([place])   . Type II diabetes mellitus ([place])   . Unspecified hearing loss     Past Surgical  History:  Procedure Laterality Date  . ABDOMINAL HYSTERECTOMY    . APPENDECTOMY    . BACK SURGERY    . CARDIAC CATHETERIZATION N/A 05/19/2016   Procedure: Left Heart Cath and Cors/Grafts Angiography;  Surgeon: Belva Crome, MD;  Location: [facility];  Service: Cardiovascular;  Laterality: N/A;  . CARDIAC CATHETERIZATION N/A 05/19/2016   Procedure: Coronary Stent Intervention;  Surgeon: Belva Crome, MD;  Location: [facility];  Service: Cardiovascular;  Laterality: N/A;  . CATARACT EXTRACTION W/ INTRAOCULAR LENS  IMPLANT, BILATERAL Bilateral   . CHOLECYSTECTOMY OPEN    . COLONOSCOPY    . CORONARY ANGIOPLASTY WITH STENT PLACEMENT  05/19/2016   "2 stents?"  . CORONARY ARTERY BYPASS GRAFT  1982; 2003   x4(1982),02-2002 CABG X2  . ESOPHAGOGASTRODUODENOSCOPY (EGD) WITH ESOPHAGEAL DILATION  "2-3 times"  .  INSERT / REPLACE / REMOVE PACEMAKER    . LEFT HEART CATHETERIZATION WITH CORONARY/GRAFT ANGIOGRAM N/A 03/05/2014   Procedure: LEFT HEART CATHETERIZATION WITH Beatrix Fetters;  Surgeon: Sinclair Grooms, MD;  Location: [facility];  Service: Cardiovascular;  Laterality: N/A;  . LUMBAR [place] SURGERY  01-2000  . OOPHORECTOMY Bilateral   . PERMANENT PACEMAKER INSERTION N/A 07/06/2012   MDT Adapta L implanted by Dr Rayann Heman for symptomatic bradycardia  . TONSILLECTOMY  1930s    Social History   Socioeconomic History  . Marital status: Married    Spouse name: [name]  . Number of children: 3  . Years of education: 46  . Highest education level: Not on file  Occupational History  . Occupation: Retired     Fish farm manager: RETIRED  Social Needs  . Financial resource strain: Not on file  . Food insecurity:    Worry: Not on file    Inability: Not on file  . Transportation needs:    Medical: Not on file    Non-medical: Not on file  Tobacco Use  . Smoking status: Former Smoker    Packs/day: 0.50    Years: 30.00    Pack years: 15.00    Types: Cigarettes    Last attempt to  quit: 07/05/1976    Years since quitting: 42.5  . Smokeless tobacco: Never Used  Substance and Sexual Activity  . Alcohol use: No    Alcohol/week: 3.0 standard drinks    Types: 3 Glasses of wine per week    Frequency: Never    Comment: "not anymore, I haven't in I don't know years"  . Drug use: No  . Sexual activity: Never  Lifestyle  . Physical activity:    Days per week: Not on file    Minutes per session: Not on file  . Stress: Not on file  Relationships  . Social connections:    Talks on phone: Not on file    Gets together: Not on file    Attends religious service: Not on file    Active member of club or organization: Not on file    Attends meetings of clubs or organizations: Not on file    Relationship status: Not on file  Other Topics Concern  . Not on file  Social History Narrative   Patient is married [name]) and lives at home with her husband.   Patient has three adult children.   Patient is retired.   Patient has a college education.   Patient is right-handed.   Patient does not drink any caffeine.    Family History  Problem Relation Age of Onset  . Heart disease Sister   . Heart attack Sister   . Colon cancer Neg Hx   . Breast cancer Neg Hx   . Celiac disease Neg Hx   . Cirrhosis Neg Hx   . Clotting disorder Neg Hx   . Colitis Neg Hx   . Colon polyps Neg Hx   . Crohn's disease Neg Hx   . Cystic fibrosis Neg Hx   . Diabetes Neg Hx   . Esophageal cancer Neg Hx   . Hemochromatosis Neg Hx   . Inflammatory bowel disease Neg Hx   . Irritable bowel syndrome Neg Hx   . Kidney disease Neg Hx   . Liver cancer Neg Hx   . Liver disease Neg Hx   . Ovarian cancer Neg Hx   . Pancreatic cancer Neg Hx   . Prostate cancer Neg Hx   .  Rectal cancer Neg Hx   . Stomach cancer Neg Hx   . Ulcerative colitis Neg Hx   . Uterine cancer Neg Hx   . Wilson's disease Neg Hx     Review of Systems  Constitutional: Negative for chills and fever.  HENT: Positive for  postnasal drip. Negative for congestion.        Clears throat a lot  Respiratory: Negative for cough, shortness of breath and wheezing.   Cardiovascular: Negative for chest pain, palpitations and leg swelling.  Gastrointestinal: Negative for abdominal pain and nausea.  Neurological: Positive for weakness (right leg and fooot) and headaches. Negative for light-headedness.       Objective:   Vitals:   01/02/19 1402  BP: 118/64  Pulse: 83  Resp: 16  Temp: (!) 97.5 F (36.4 C)  SpO2: 95%   BP Readings from Last 3 Encounters:  01/02/19 118/64  10/26/18 102/64  09/29/18 112/60   Wt Readings from Last 3 Encounters:  01/02/19 104 lb (47.2 kg)  10/26/18 102 lb 6.4 oz (46.4 kg)  09/29/18 102 lb 6.4 oz (46.4 kg)   Body mass index is 19.65 kg/m.   Physical Exam    Constitutional: Appears thin.  No distress.  HENT:  Head: Normocephalic and atraumatic.  Neck: Neck supple. No tracheal deviation present. No thyromegaly present.  No cervical lymphadenopathy Cardiovascular: Normal rate, regular rhythm and normal heart sounds.   No murmur heard. No carotid bruit .  No edema Pulmonary/Chest: Effort normal and breath sounds normal. No respiratory distress. No has no wheezes. No rales.  Skin: Skin is warm and dry. Not diaphoretic.  Psychiatric: Normal mood and affect. Behavior is normal.      Assessment & Plan:    See Problem List for Assessment and Plan of chronic medical problems.

## 2019-01-02 ENCOUNTER — Ambulatory Visit: Payer: Medicare Other | Admitting: *Deleted

## 2019-01-02 ENCOUNTER — Ambulatory Visit (INDEPENDENT_AMBULATORY_CARE_PROVIDER_SITE_OTHER): Payer: Medicare Other | Admitting: Internal Medicine

## 2019-01-02 ENCOUNTER — Other Ambulatory Visit (INDEPENDENT_AMBULATORY_CARE_PROVIDER_SITE_OTHER): Payer: Medicare Other

## 2019-01-02 ENCOUNTER — Encounter: Payer: Self-pay | Admitting: Internal Medicine

## 2019-01-02 VITALS — BP 118/64 | HR 83 | Resp 16 | Ht 61.0 in | Wt 104.0 lb

## 2019-01-02 VITALS — BP 118/64 | HR 83 | Temp 97.5°F | Resp 16 | Ht 61.0 in | Wt 104.0 lb

## 2019-01-02 DIAGNOSIS — E039 Hypothyroidism, unspecified: Secondary | ICD-10-CM | POA: Diagnosis not present

## 2019-01-02 DIAGNOSIS — I10 Essential (primary) hypertension: Secondary | ICD-10-CM

## 2019-01-02 DIAGNOSIS — E782 Mixed hyperlipidemia: Secondary | ICD-10-CM

## 2019-01-02 DIAGNOSIS — K219 Gastro-esophageal reflux disease without esophagitis: Secondary | ICD-10-CM

## 2019-01-02 DIAGNOSIS — F3289 Other specified depressive episodes: Secondary | ICD-10-CM

## 2019-01-02 DIAGNOSIS — E1165 Type 2 diabetes mellitus with hyperglycemia: Secondary | ICD-10-CM

## 2019-01-02 DIAGNOSIS — R634 Abnormal weight loss: Secondary | ICD-10-CM | POA: Diagnosis not present

## 2019-01-02 DIAGNOSIS — G479 Sleep disorder, unspecified: Secondary | ICD-10-CM | POA: Diagnosis not present

## 2019-01-02 DIAGNOSIS — I200 Unstable angina: Secondary | ICD-10-CM

## 2019-01-02 DIAGNOSIS — Z0000 Encounter for general adult medical examination without abnormal findings: Secondary | ICD-10-CM | POA: Diagnosis not present

## 2019-01-02 LAB — CBC WITH DIFFERENTIAL/PLATELET
BASOS PCT: 0.9 % (ref 0.0–3.0)
Basophils Absolute: 0.1 10*3/uL (ref 0.0–0.1)
EOS PCT: 3 % (ref 0.0–5.0)
Eosinophils Absolute: 0.2 10*3/uL (ref 0.0–0.7)
HCT: 36 % (ref 36.0–46.0)
Hemoglobin: 11.8 g/dL — ABNORMAL LOW (ref 12.0–15.0)
LYMPHS ABS: 1.6 10*3/uL (ref 0.7–4.0)
Lymphocytes Relative: 22.7 % (ref 12.0–46.0)
MCHC: 32.8 g/dL (ref 30.0–36.0)
MCV: 87.3 fl (ref 78.0–100.0)
MONO ABS: 0.5 10*3/uL (ref 0.1–1.0)
Monocytes Relative: 6.5 % (ref 3.0–12.0)
NEUTROS PCT: 66.9 % (ref 43.0–77.0)
Neutro Abs: 4.7 10*3/uL (ref 1.4–7.7)
Platelets: 283 10*3/uL (ref 150.0–400.0)
RBC: 4.13 Mil/uL (ref 3.87–5.11)
RDW: 14.3 % (ref 11.5–15.5)
WBC: 7 10*3/uL (ref 4.0–10.5)

## 2019-01-02 LAB — COMPREHENSIVE METABOLIC PANEL
ALT: 12 U/L (ref 0–35)
AST: 14 U/L (ref 0–37)
Albumin: 4.2 g/dL (ref 3.5–5.2)
Alkaline Phosphatase: 72 U/L (ref 39–117)
BILIRUBIN TOTAL: 1.2 mg/dL (ref 0.2–1.2)
BUN: 26 mg/dL — AB (ref 6–23)
CO2: 26 mEq/L (ref 19–32)
Calcium: 9.3 mg/dL (ref 8.4–10.5)
Chloride: 105 mEq/L (ref 96–112)
Creatinine, Ser: 1.16 mg/dL (ref 0.40–1.20)
GFR: 44.17 mL/min — ABNORMAL LOW (ref >60.00)
Glucose, Bld: 145 mg/dL — ABNORMAL HIGH (ref 70–99)
Potassium: 4.8 mEq/L (ref 3.5–5.1)
SODIUM: 139 meq/L (ref 135–145)
Total Protein: 6.6 g/dL (ref 6.0–8.3)

## 2019-01-02 LAB — LIPID PANEL
CHOLESTEROL: 154 mg/dL (ref 0–200)
HDL: 37.2 mg/dL — ABNORMAL LOW (ref >39.00)
LDL Cholesterol: 96 mg/dL (ref 0–99)
NONHDL: 116.5
Total CHOL/HDL Ratio: 4
Triglycerides: 104 mg/dL (ref 0.0–149.0)
VLDL: 20.8 mg/dL (ref 0.0–40.0)

## 2019-01-02 LAB — HEMOGLOBIN A1C: Hgb A1c MFr Bld: 6.4 % (ref 4.6–6.5)

## 2019-01-02 LAB — TSH: TSH: 0.46 u[IU]/mL (ref 0.35–4.50)

## 2019-01-02 NOTE — Assessment & Plan Note (Signed)
Controlled, stable Continue current dose of medication  

## 2019-01-02 NOTE — Assessment & Plan Note (Signed)
Clinically euthyroid Check tsh  Titrate med dose if needed  

## 2019-01-02 NOTE — Progress Notes (Signed)
Subjective:   Janet Steele is a 85 y.o. female who presents for Medicare Annual (Subsequent) preventive examination.  Review of Systems:  No ROS.  Medicare Wellness Visit. Additional risk factors are reflected in the social history.  Cardiac Risk Factors include: advanced age (>31men, >68 women);diabetes mellitus;dyslipidemia;hypertension Sleep patterns: has interrupted sleep, gets up 2 times nightly to void and sleeps hours varies nightly. Patient reports insomnia issues, discussed recommended sleep tips.   Home Safety/Smoke Alarms: Feels safe in home. Smoke alarms in place.  Living environment; residence and Firearm Safety: 1-story house/ trailer, equipment: Walkers, Type: Conservation officer, nature, Hydrologist, Type: Tub Surveyor, quantity and has an Engineer, production that comes daily to help with ADLs, no firearms. Resides at [employer] Safety/Bike Helmet: Wears seat belt.     Objective:     Vitals: BP 118/64   Pulse 83   Resp 16   Ht 5\' 1"  (1.549 m)   Wt 104 lb (47.2 kg)   SpO2 95%   BMI 19.65 kg/m   Body mass index is 19.65 kg/m.  Advanced Directives 01/02/2019 03/09/2017 01/17/2017 05/19/2016 05/07/2015 01/29/2015 09/12/2014  Does Patient Have a Medical Advance Directive? Yes Yes Yes Yes Yes No Yes  Type of Paramedic of [place];Living will [place];Living will [place];Living will [place];Living will [place];Living will - [place];Living will  Does patient want to make changes to medical advance directive? - - - No - Patient declined - - No - Patient declined  Copy of [place] in Chart? Yes - validated most recent copy scanned in chart (See row information) - Yes No - copy requested No - copy requested - No - copy requested  Would patient like information on creating a medical advance directive? - - - - - No - patient declined information -    Pre-existing out of facility DNR order (yellow form or pink MOST form) - - - - - - -    Tobacco Social History   Tobacco Use  Smoking Status Former Smoker  . Packs/day: 0.50  . Years: 30.00  . Pack years: 15.00  . Types: Cigarettes  . Last attempt to quit: 07/05/1976  . Years since quitting: 42.5  Smokeless Tobacco Never Used     Counseling given: Not Answered  Past Medical History:  Diagnosis Date  . Anemia   . Anxiety   . Arthritis    "fingers" (05/19/2016  . CAD (coronary artery disease)    a. s/p CABG 1982 b. redo CABG 2003. c. [place] despite med rx 05/2016: successful but complicated PCI of PDA beyond graft site, c/b localized dissection requiring overlapping stent.  . Carotid bruit    a. Duplex 01/2015: patent vessels, 1-39% BICA, f/u PRN recommened.  . Colon polyp    adenomatous  . Colon, diverticulosis   . Degenerative joint disease   . Diastolic dysfunction   . Esophageal stricture   . GERD (gastroesophageal reflux disease)   . Hiatal hernia   . History of blood transfusion    "related to my bypass"  . Hyperlipidemia   . Hypertension   . Hypothyroidism   . Ischemic cardiomyopathy    a. Cath 05/2016: EF 40% with severe inferior wall HK, suspect hibernating myocardium.  . Myocardial infarction ([place]) 1977; 1982  . Osteoarthrosis, unspecified whether generalized or localized, unspecified site   . Presence of permanent cardiac pacemaker   .  PVD (peripheral vascular disease) ([place])   . Type II diabetes mellitus ([place])   . Unspecified hearing loss    Past Surgical History:  Procedure Laterality Date  . ABDOMINAL HYSTERECTOMY    . APPENDECTOMY    . BACK SURGERY    . CARDIAC CATHETERIZATION N/A 05/19/2016   Procedure: Left Heart Cath and Cors/Grafts Angiography;  Surgeon: Belva Crome, MD;  Location: [facility];  Service: Cardiovascular;  Laterality: N/A;  . CARDIAC CATHETERIZATION N/A 05/19/2016   Procedure: Coronary Stent Intervention;  Surgeon: Belva Crome, MD;  Location: [facility];  Service: Cardiovascular;  Laterality: N/A;  . CATARACT EXTRACTION W/ INTRAOCULAR LENS  IMPLANT, BILATERAL Bilateral   . CHOLECYSTECTOMY OPEN    . COLONOSCOPY    . CORONARY ANGIOPLASTY WITH STENT PLACEMENT  05/19/2016   "2 stents?"  . CORONARY ARTERY BYPASS GRAFT  1982; 2003   x4(1982),02-2002 CABG X2  . ESOPHAGOGASTRODUODENOSCOPY (EGD) WITH ESOPHAGEAL DILATION  "2-3 times"  . INSERT / REPLACE / REMOVE PACEMAKER    . LEFT HEART CATHETERIZATION WITH CORONARY/GRAFT ANGIOGRAM N/A 03/05/2014   Procedure: LEFT HEART CATHETERIZATION WITH Beatrix Fetters;  Surgeon: Sinclair Grooms, MD;  Location: [facility];  Service: Cardiovascular;  Laterality: N/A;  . LUMBAR [place] SURGERY  01-2000  . OOPHORECTOMY Bilateral   . PERMANENT PACEMAKER INSERTION N/A 07/06/2012   MDT Adapta L implanted by Dr Rayann Heman for symptomatic bradycardia  . TONSILLECTOMY  1930s   Family History  Problem Relation Age of Onset  . Heart disease Sister   . Heart attack Sister   . Colon cancer Neg Hx   . Breast cancer Neg Hx   . Celiac disease Neg Hx   . Cirrhosis Neg Hx   . Clotting disorder Neg Hx   . Colitis Neg Hx   . Colon polyps Neg Hx   . Crohn's disease Neg Hx   . Cystic fibrosis Neg Hx   . Diabetes Neg Hx   . Esophageal cancer Neg Hx   . Hemochromatosis Neg Hx   . Inflammatory bowel disease Neg Hx   . Irritable bowel syndrome Neg Hx   . Kidney disease Neg Hx   . Liver cancer Neg Hx   . Liver disease Neg Hx   . Ovarian cancer Neg Hx   . Pancreatic cancer Neg Hx   . Prostate cancer Neg Hx   . Rectal cancer Neg Hx   . Stomach cancer Neg Hx   . Ulcerative colitis Neg Hx   . Uterine cancer Neg Hx   . Wilson's disease Neg Hx    Social History   Socioeconomic History  . Marital status: Married    Spouse name: [name]  . Number of children: 3  . Years of education: 27  . Highest education level: Not on file  Occupational History  . Occupation: Retired      Fish farm manager: RETIRED  Social Needs  . Financial resource strain: Not hard at all  . Food insecurity:    Worry: Never true    Inability: Never true  . Transportation needs:    Medical: No    Non-medical: No  Tobacco Use  . Smoking status: Former Smoker    Packs/day: 0.50    Years: 30.00    Pack years: 15.00    Types: Cigarettes    Last attempt to quit: 07/05/1976    Years since quitting: 42.5  . Smokeless tobacco: Never Used  Substance and Sexual Activity  .  Alcohol use: No    Alcohol/week: 3.0 standard drinks    Types: 3 Glasses of  per week    Frequency: Never    Comment: "not anymore, I haven't in I don't know years"  . Drug use: No  . Sexual activity: Never  Lifestyle  . Physical activity:    Days per week: 3 days    Minutes per session: 20 min  . Stress: Not at all  Relationships  . Social connections:    Talks on phone: More than three times a week    Gets together: Three times a week    Attends religious service: 1 to 4 times per year    Active member of club or organization: No    Attends meetings of clubs or organizations: Never    Relationship status: Married  Other Topics Concern  . Not on file  Social History Narrative   Patient is married [name]) and lives at home with her husband.   Patient has three adult children.   Patient is retired.   Patient has a college education.   Patient is right-handed.   Patient does not drink any caffeine.    Outpatient Encounter Medications as of 01/02/2019  Medication Sig  . acetaminophen (TYLENOL) 500 MG tablet Take 2 tablets (1,000 mg total) by mouth every 6 (six) hours as needed.  Marland Kitchen atorvastatin (LIPITOR) 80 MG tablet TAKE 1 TABLET BY MOUTH ONCE DAILY  . clopidogrel (PLAVIX) 75 MG tablet TAKE 1 TABLET BY MOUTH ONCE DAILY  . DULoxetine (CYMBALTA) 30 MG capsule Take 1 capsule (30 mg total) by mouth daily.  Marland Kitchen JANUVIA 50 MG tablet Take 50 mg by mouth daily.  Marland Kitchen levothyroxine (SYNTHROID, LEVOTHROID) 88 MCG tablet  Take 1 tablet (88 mcg total) by mouth daily.  Marland Kitchen losartan (COZAAR) 25 MG tablet TAKE 1 TABLET(25 MG) BY MOUTH DAILY  . metFORMIN (GLUCOPHAGE) 1000 MG tablet TAKE 1 TABLET BY MOUTH TWICE DAILY WITH MEALS  . metoprolol tartrate (LOPRESSOR) 25 MG tablet TAKE 1 TABLET BY MOUTH TWICE DAILY  . nitroGLYCERIN (NITROSTAT) 0.4 MG SL tablet Place 1 tablet (0.4 mg total) under the tongue every 5 (five) minutes as needed. For chest pain  . pantoprazole (PROTONIX) 40 MG tablet TAKE 1 TABLET BY MOUTH TWICE A DAY  . QUEtiapine (SEROQUEL) 25 MG tablet Take 1 tablet (25 mg total) by mouth at bedtime.  Marland Kitchen RA ASPIRIN EC 81 MG EC tablet take 1 tablet by mouth once daily . OVERDUE FOR FOLLOW UP. PLEASE CALL AND SCHEDULE [phone].   No facility-administered encounter medications on file as of 01/02/2019.     Activities of Daily Living In your present state of health, do you have any difficulty performing the following activities: 01/02/2019  Hearing? Y  Vision? N  Difficulty concentrating or making decisions? Y  Walking or climbing stairs? Y  Dressing or bathing? Y  Doing errands, shopping? Y  Preparing Food and eating ? Y  Using the Toilet? N  In the past six months, have you accidently leaked urine? N  Do you have problems with loss of bowel control? N  Managing your Medications? Y  Managing your Finances? Y  Housekeeping or managing your Housekeeping? Y  Some recent data might be hidden    Patient Care Team: Binnie Rail, MD as PCP - General (Internal Medicine) Sherren Mocha, MD as PCP - Cardiology (Cardiology) Ward Givens, NP as Registered Nurse (Neurology) Irene Shipper, MD as Consulting Physician (Gastroenterology)  Assessment:   This is a routine wellness examination for Tyrah. Physical assessment deferred to PCP.  Exercise Activities and Dietary recommendations Current Exercise Habits: Home exercise routine, Type of exercise: walking, Time (Minutes): 20, Frequency (Times/Week):  4, Weekly Exercise (Minutes/Week): 80, Exercise limited by: None identified  Diet (meal preparation, eat out, water intake, caffeinated beverages, dairy products, fruits and vegetables): in general, a "healthy" diet   Reports her appetite has improved and she does supplement with ensure, ensure coupons were provided. Currently maintaining weight.  Encouraged patient to increase daily water and healthy fluid intake.   Goals    . Patient Stated     Maintain current health status.       Fall Risk Fall Risk  01/02/2019 07/24/2018 11/01/2017 01/17/2017 12/17/2016  Falls in the past year? 0 Yes Yes No No  Number falls in past yr: - 2 or more 2 or more - -  Injury with Fall? - - No - -  Risk Factor Category  - - High Fall Risk - -  Risk for fall due to : - Impaired mobility History of fall(s);Impaired balance/gait - -    Depression Screen PHQ 2/9 Scores 01/02/2019 01/17/2017 12/17/2016 11/18/2015  PHQ - 2 Score 1 0 0 1  PHQ- 9 Score 9 - - -     Cognitive Function MMSE - Mini Mental State Exam 01/02/2019 04/05/2017 01/17/2017 01/04/2017 11/17/2016  Not completed: Refused - - - -  Orientation to time - 5 5 3 4   Orientation to Place - 4 5 5 5   Registration - 3 2 3 3   Attention/ Calculation - 1 2 5 5   Recall - 1 2 3 2   Language- name 2 objects - 2 2 2 2   Language- repeat - 0 1 1 0  Language- follow 3 step command - 3 2 3 3   Language- read & follow direction - 1 1 1 1   Language-read & follow direction-comments - - - - -  Write a sentence - 1 1 1 1   Copy design - 1 1 1 1   Total score - 22 24 28 27    Montreal Cognitive Assessment  11/06/2015  Visuospatial/ Executive (0/5) 0  Naming (0/3) 3  Attention: Read list of digits (0/2) 1      Immunization History  Administered Date(s) Administered  . Influenza Whole 12/06/1997, 09/25/2007  . Influenza, High Dose Seasonal PF 09/27/2014, 09/06/2017, 09/29/2018  . Influenza-Unspecified 08/06/2013, 08/13/2015, 10/15/2016  . Pneumococcal  Conjugate-13 08/06/2015  . Pneumococcal Polysaccharide-23 12/06/1996, 05/28/2010  . Td 05/06/2004  . Tdap 06/05/2018   Screening Tests Health Maintenance  Topic Date Due  . DEXA SCAN  10/12/1996  . OPHTHALMOLOGY EXAM  04/06/2019  . FOOT EXAM  09/30/2019  . TETANUS/TDAP  06/05/2028  . INFLUENZA VACCINE  Completed  . PNA vac Low Risk Adult  Completed      Plan:     Continue doing brain stimulating activities (puzzles, reading, adult coloring books, staying active) to keep memory sharp.   Continue to eat heart healthy diet (full of fruits, vegetables, whole grains, lean protein, water--limit salt, fat, and sugar intake) and increase physical activity as tolerated.   I have personally reviewed and noted the following in the patient's chart:   . Medical and social history . Use of alcohol, tobacco or illicit drugs  . Current medications and supplements . Functional ability and status . Nutritional status . Physical activity . Advanced directives . List of other physicians . Vitals .  Screenings to include cognitive, depression, and falls . Referrals and appointments  In addition, I have reviewed and discussed with patient certain preventive protocols, quality metrics, and best practice recommendations. A written personalized care plan for preventive services as well as general preventive health recommendations were provided to patient.     Michiel Cowboy, RN  01/02/2019

## 2019-01-02 NOTE — Patient Instructions (Addendum)
Continue doing brain stimulating activities (puzzles, reading, adult coloring books, staying active) to keep memory sharp.   Continue to eat heart healthy diet (full of fruits, vegetables, whole grains, lean protein, water--limit salt, fat, and sugar intake) and increase physical activity as tolerated.   Janet Steele , Thank you for taking time to come for your Medicare Wellness Visit. I appreciate your ongoing commitment to your health goals. Please review the following plan we discussed and let me know if I can assist you in the future.   These are the goals we discussed: Goals    . Patient Stated     Maintain current health status.       This is a list of the screening recommended for you and due dates:  Health Maintenance  Topic Date Due  . DEXA scan (bone density measurement)  10/12/1996  . Eye exam for diabetics  04/06/2019  . Complete foot exam   09/30/2019  . Tetanus Vaccine  06/05/2028  . Flu Shot  Completed  . Pneumonia vaccines  Completed   Health Maintenance, Female Adopting a healthy lifestyle and getting preventive care can go a long way to promote health and wellness. Talk with your health care provider about what schedule of regular examinations is right for you. This is a good chance for you to check in with your provider about disease prevention and staying healthy. In between checkups, there are plenty of things you can do on your own. Experts have done a lot of research about which lifestyle changes and preventive measures are most likely to keep you healthy. Ask your health care provider for more information. Weight and diet Eat a healthy diet  Be sure to include plenty of vegetables, fruits, low-fat dairy products, and lean protein.  Do not eat a lot of foods high in solid fats, added sugars, or salt.  Get regular exercise. This is one of the most important things you can do for your health. ? Most adults should exercise for at least 150 minutes each week. The  exercise should increase your heart rate and make you sweat (moderate-intensity exercise). ? Most adults should also do strengthening exercises at least twice a week. This is in addition to the moderate-intensity exercise. Maintain a healthy weight  Body mass index (BMI) is a measurement that can be used to identify possible weight problems. It estimates body fat based on height and weight. Your health care provider can help determine your BMI and help you achieve or maintain a healthy weight.  For females 80 years of age and older: ? A BMI below 18.5 is considered underweight. ? A BMI of 18.5 to 24.9 is normal. ? A BMI of 25 to 29.9 is considered overweight. ? A BMI of 30 and above is considered obese. Watch levels of cholesterol and blood lipids  You should start having your blood tested for lipids and cholesterol at 83 years of age, then have this test every 5 years.  You may need to have your cholesterol levels checked more often if: ? Your lipid or cholesterol levels are high. ? You are older than 83 years of age. ? You are at high risk for heart disease. Cancer screening Lung Cancer  Lung cancer screening is recommended for adults 63-60 years old who are at high risk for lung cancer because of a history of smoking.  A yearly low-dose CT scan of the lungs is recommended for people who: ? Currently smoke. ? Have quit within the  past 15 years. ? Have at least a 30-pack-year history of smoking. A pack year is smoking an average of one pack of cigarettes a day for 1 year.  Yearly screening should continue until it has been 15 years since you quit.  Yearly screening should stop if you develop a health problem that would prevent you from having lung cancer treatment. Breast Cancer  Practice breast self-awareness. This means understanding how your breasts normally appear and feel.  It also means doing regular breast self-exams. Let your health care provider know about any changes,  no matter how small.  If you are in your 20s or 30s, you should have a clinical breast exam (CBE) by a health care provider every 1-3 years as part of a regular health exam.  If you are 64 or older, have a CBE every year. Also consider having a breast X-ray (mammogram) every year.  If you have a family history of breast cancer, talk to your health care provider about genetic screening.  If you are at high risk for breast cancer, talk to your health care provider about having an MRI and a mammogram every year.  Breast cancer gene (BRCA) assessment is recommended for women who have family members with BRCA-related cancers. BRCA-related cancers include: ? Breast. ? Ovarian. ? Tubal. ? Peritoneal cancers.  Results of the assessment will determine the need for genetic counseling and BRCA1 and BRCA2 testing. Cervical Cancer Your health care provider may recommend that you be screened regularly for cancer of the pelvic organs (ovaries, uterus, and vagina). This screening involves a pelvic examination, including checking for microscopic changes to the surface of your cervix (Pap test). You may be encouraged to have this screening done every 3 years, beginning at age 66.  For women ages 66-65, health care providers may recommend pelvic exams and Pap testing every 3 years, or they may recommend the Pap and pelvic exam, combined with testing for human papilloma virus (HPV), every 5 years. Some types of HPV increase your risk of cervical cancer. Testing for HPV may also be done on women of any age with unclear Pap test results.  Other health care providers may not recommend any screening for nonpregnant women who are considered low risk for pelvic cancer and who do not have symptoms. Ask your health care provider if a screening pelvic exam is right for you.  If you have had past treatment for cervical cancer or a condition that could lead to cancer, you need Pap tests and screening for cancer for at  least 20 years after your treatment. If Pap tests have been discontinued, your risk factors (such as having a new sexual partner) need to be reassessed to determine if screening should resume. Some women have medical problems that increase the chance of getting cervical cancer. In these cases, your health care provider may recommend more frequent screening and Pap tests. Colorectal Cancer  This type of cancer can be detected and often prevented.  Routine colorectal cancer screening usually begins at 83 years of age and continues through 83 years of age.  Your health care provider may recommend screening at an earlier age if you have risk factors for colon cancer.  Your health care provider may also recommend using home test kits to check for hidden blood in the stool.  A small camera at the end of a tube can be used to examine your colon directly (sigmoidoscopy or colonoscopy). This is done to check for the earliest forms of  colorectal cancer.  Routine screening usually begins at age 95.  Direct examination of the colon should be repeated every 5-10 years through 83 years of age. However, you may need to be screened more often if early forms of precancerous polyps or small growths are found. Skin Cancer  Check your skin from head to toe regularly.  Tell your health care provider about any new moles or changes in moles, especially if there is a change in a mole's shape or color.  Also tell your health care provider if you have a mole that is larger than the size of a pencil eraser.  Always use sunscreen. Apply sunscreen liberally and repeatedly throughout the day.  Protect yourself by wearing long sleeves, pants, a wide-brimmed hat, and sunglasses whenever you are outside. Heart disease, diabetes, and high blood pressure  High blood pressure causes heart disease and increases the risk of stroke. High blood pressure is more likely to develop in: ? People who have blood pressure in the  high end of the normal range (130-139/85-89 mm Hg). ? People who are overweight or obese. ? People who are African American.  If you are 24-72 years of age, have your blood pressure checked every 3-5 years. If you are 64 years of age or older, have your blood pressure checked every year. You should have your blood pressure measured twice-once when you are at a hospital or clinic, and once when you are not at a hospital or clinic. Record the average of the two measurements. To check your blood pressure when you are not at a hospital or clinic, you can use: ? An automated blood pressure machine at a pharmacy. ? A home blood pressure monitor.  If you are between 81 years and 16 years old, ask your health care provider if you should take aspirin to prevent strokes.  Have regular diabetes screenings. This involves taking a blood sample to check your fasting blood sugar level. ? If you are at a normal weight and have a low risk for diabetes, have this test once every three years after 83 years of age. ? If you are overweight and have a high risk for diabetes, consider being tested at a younger age or more often. Preventing infection Hepatitis B  If you have a higher risk for hepatitis B, you should be screened for this virus. You are considered at high risk for hepatitis B if: ? You were born in a country where hepatitis B is common. Ask your health care provider which countries are considered high risk. ? Your parents were born in a high-risk country, and you have not been immunized against hepatitis B (hepatitis B vaccine). ? You have HIV or AIDS. ? You use needles to inject street drugs. ? You live with someone who has hepatitis B. ? You have had sex with someone who has hepatitis B. ? You get hemodialysis treatment. ? You take certain medicines for conditions, including cancer, organ transplantation, and autoimmune conditions. Hepatitis C  Blood testing is recommended for: ? Everyone born  from [birth year] through 1963. ? Anyone with known risk factors for hepatitis C. Sexually transmitted infections (STIs)  You should be screened for sexually transmitted infections (STIs) including gonorrhea and chlamydia if: ? You are sexually active and are younger than 83 years of age. ? You are older than 83 years of age and your health care provider tells you that you are at risk for this type of infection. ? Your sexual activity has  changed since you were last screened and you are at an increased risk for chlamydia or gonorrhea. Ask your health care provider if you are at risk.  If you do not have HIV, but are at risk, it may be recommended that you take a prescription medicine daily to prevent HIV infection. This is called pre-exposure prophylaxis (PrEP). You are considered at risk if: ? You are sexually active and do not regularly use condoms or know the HIV status of your partner(s). ? You take drugs by injection. ? You are sexually active with a partner who has HIV. Talk with your health care provider about whether you are at high risk of being infected with HIV. If you choose to begin PrEP, you should first be tested for HIV. You should then be tested every 3 months for as long as you are taking PrEP. Pregnancy  If you are premenopausal and you may become pregnant, ask your health care provider about preconception counseling.  If you may become pregnant, take 400 to 800 micrograms (mcg) of folic acid every day.  If you want to prevent pregnancy, talk to your health care provider about birth control (contraception). Osteoporosis and menopause  Osteoporosis is a disease in which the bones lose minerals and strength with aging. This can result in serious bone fractures. Your risk for osteoporosis can be identified using a bone density scan.  If you are 4 years of age or older, or if you are at risk for osteoporosis and fractures, ask your health care provider if you should be  screened.  Ask your health care provider whether you should take a calcium or vitamin D supplement to lower your risk for osteoporosis.  Menopause may have certain physical symptoms and risks.  Hormone replacement therapy may reduce some of these symptoms and risks. Talk to your health care provider about whether hormone replacement therapy is right for you. Follow these instructions at home:  Schedule regular health, dental, and eye exams.  Stay current with your immunizations.  Do not use any tobacco products including cigarettes, chewing tobacco, or electronic cigarettes.  If you are pregnant, do not drink alcohol.  If you are breastfeeding, limit how much and how often you drink alcohol.  Limit alcohol intake to no more than 1 drink per day for nonpregnant women. One drink equals 12 ounces of beer, 5 ounces of Benyamin Jeff, or 1 ounces of hard liquor.  Do not use street drugs.  Do not share needles.  Ask your health care provider for help if you need support or information about quitting drugs.  Tell your health care provider if you often feel depressed.  Tell your health care provider if you have ever been abused or do not feel safe at home. This information is not intended to replace advice given to you by your health care provider. Make sure you discuss any questions you have with your health care provider. Document Released: 06/07/2011 Document Revised: 04/29/2016 Document Reviewed: 08/26/2015 Elsevier Interactive Patient Education  2019 [employer].

## 2019-01-02 NOTE — Assessment & Plan Note (Signed)
GERD very well controlled Decrease pantoprazole 40 mg to daily (not twice daily)

## 2019-01-02 NOTE — Assessment & Plan Note (Signed)
Has gained a couple of pounds Monitor closely

## 2019-01-02 NOTE — Assessment & Plan Note (Signed)
Check a1c Low sugar / carb diet Continue walking, regular activity

## 2019-01-02 NOTE — Assessment & Plan Note (Signed)
Check lipid panel  Continue daily statin Regular exercise and healthy diet encouraged  

## 2019-01-02 NOTE — Assessment & Plan Note (Signed)
BP well controlled Current regimen effective and well tolerated Continue current medications at current doses cmp  

## 2019-01-02 NOTE — Patient Instructions (Signed)
  Tests ordered today. Your results will be released to [place] (or called to you) after review, usually within 72hours after test completion. If any changes need to be made, you will be notified at that same time.   Medications reviewed and updated.  Changes include :   Decrease pantoprazole to 40 mg once a day   Please followup in 6 months

## 2019-01-02 NOTE — Assessment & Plan Note (Signed)
?   Controlled No complaints from family Continue seroquel

## 2019-01-03 ENCOUNTER — Encounter: Payer: Self-pay | Admitting: Internal Medicine

## 2019-01-15 DIAGNOSIS — H35033 Hypertensive retinopathy, bilateral: Secondary | ICD-10-CM | POA: Diagnosis not present

## 2019-01-15 DIAGNOSIS — H353211 Exudative age-related macular degeneration, right eye, with active choroidal neovascularization: Secondary | ICD-10-CM | POA: Diagnosis not present

## 2019-01-15 DIAGNOSIS — H353122 Nonexudative age-related macular degeneration, left eye, intermediate dry stage: Secondary | ICD-10-CM | POA: Diagnosis not present

## 2019-01-15 DIAGNOSIS — H43813 Vitreous degeneration, bilateral: Secondary | ICD-10-CM | POA: Diagnosis not present

## 2019-01-19 ENCOUNTER — Other Ambulatory Visit: Payer: Self-pay | Admitting: Internal Medicine

## 2019-02-05 ENCOUNTER — Other Ambulatory Visit (INDEPENDENT_AMBULATORY_CARE_PROVIDER_SITE_OTHER): Payer: Medicare Other

## 2019-02-05 ENCOUNTER — Ambulatory Visit (INDEPENDENT_AMBULATORY_CARE_PROVIDER_SITE_OTHER): Payer: Medicare Other | Admitting: Internal Medicine

## 2019-02-05 ENCOUNTER — Ambulatory Visit (INDEPENDENT_AMBULATORY_CARE_PROVIDER_SITE_OTHER)
Admission: RE | Admit: 2019-02-05 | Discharge: 2019-02-05 | Disposition: A | Discharge status: Home / Self Care | Payer: Medicare Other | Source: Ambulatory Visit | Attending: Internal Medicine | Admitting: Internal Medicine

## 2019-02-05 ENCOUNTER — Encounter: Payer: Self-pay | Admitting: Internal Medicine

## 2019-02-05 ENCOUNTER — Ambulatory Visit: Payer: Medicare Other | Admitting: Cardiovascular Disease

## 2019-02-05 DIAGNOSIS — R1030 Lower abdominal pain, unspecified: Secondary | ICD-10-CM | POA: Diagnosis not present

## 2019-02-05 DIAGNOSIS — I200 Unstable angina: Secondary | ICD-10-CM

## 2019-02-05 DIAGNOSIS — R1032 Left lower quadrant pain: Secondary | ICD-10-CM | POA: Insufficient documentation

## 2019-02-05 LAB — URINALYSIS, ROUTINE W REFLEX MICROSCOPIC
Bilirubin Urine: NEGATIVE
Hgb urine dipstick: NEGATIVE
Ketones, ur: NEGATIVE
Leukocytes,Ua: NEGATIVE
Nitrite: NEGATIVE
Specific Gravity, Urine: 1.02 (ref 1.000–1.030)
Total Protein, Urine: NEGATIVE
URINE GLUCOSE: NEGATIVE
UROBILINOGEN UA: 0.2 (ref 0.0–1.0)
pH: 5.5 (ref 5.0–8.0)

## 2019-02-05 NOTE — Assessment & Plan Note (Signed)
Lower abdominal pain Poor historian - limited by dementia Probably related to constipation No urinary symptoms but has h/o UTI's Ua, Ucx KUB to evaluate for constipation Start stool softeners daily - 1-3 colace - titrate as needed If no improvement will need CT scan

## 2019-02-05 NOTE — Patient Instructions (Signed)
Your pain may be related to constipation or a UTI.    Start taking colace 1-3 pills a day to help with your constipation.  Adjust the amount you are taking depending on your stool.       Have a abdominal xray and give a urine sample today.  We will call you with the results.

## 2019-02-05 NOTE — Progress Notes (Signed)
Subjective:    Patient ID: Janet Steele, female    DOB: [birth year], 85 y.o.   MRN: [id number]  HPI The patient is here for an acute visit.  She is here alone and her history is limited by her dementia.    Abdominal pain:  It started the middle of last week.  The pain is across her lower abdomen.  It is intermittent.. It is not related to eating.  She has chronic constipation and states she can go 2-3 weeks w/o having a BM - she is unsure if she was constipated last week.   She has pain across her lower abdomen today.  She thinks her last BM was last week.  She does not take anything for her constipation.     Medications and allergies reviewed with patient and updated if appropriate.  Patient Active Problem List   Diagnosis Date Noted  . Other headache syndrome 08/14/2018  . Depression 08/14/2018  . Weight loss 06/05/2018  . Stroke ([place]) 11/04/2017  . Weakness 11/03/2017  . History of cerebrovascular accident (CVA) with residual deficit 11/03/2017  . Chronic back pain 07/04/2017  . PAD (peripheral artery disease) ([place]) 01/17/2017  . Overactive bladder 12/17/2016  . Anemia 05/20/2016  . Coronary artery disease involving coronary bypass graft of native heart without angina pectoris 05/19/2016  . Numbness and tingling of foot 12/31/2015  . MCI (mild cognitive impairment) with memory loss 11/18/2015  . Facial tic 05/07/2015  . Sick sinus syndrome ([place]) 02/26/2015  . Cervical dystonia 12/04/2014  . Chronic motor tic 09/12/2014  . Pacemaker-Medtronic 07/07/2012  . GERD (gastroesophageal reflux disease) 07/05/2012  . Cough 08/04/2010  . Abnormal involuntary movement 12/17/2009  . Disturbance in sleep behavior 12/11/2009  . CAD, ARTERY BYPASS GRAFT 09/24/2009  . CAROTID BRUIT 09/24/2009  . Diabetes ([place]) 01/30/2009  . DYSPHAGIA 06/19/2008  . Anxiety 02/14/2008  . EROSIVE ESOPHAGITIS 02/14/2008  . CONSTIPATION, CHRONIC 02/14/2008  . DEGENERATIVE JOINT DISEASE 02/14/2008  .  HEARING LOSS 01/04/2008  . PVD 01/04/2008  . Hypothyroidism 09/25/2007  . HYPERLIPIDEMIA 09/25/2007  . Essential hypertension 09/07/2007  . Diabetic polyneuropathy ([place]) 03/16/2007  . Stricture and stenosis of esophagus 03/16/2007  . HIATAL HERNIA 02/16/2007  . HEMORRHOIDS 12/31/1997  . DIVERTICULOSIS, COLON 12/31/1997    Current Outpatient Medications on File Prior to Visit  Medication Sig Dispense Refill  . acetaminophen (TYLENOL) 500 MG tablet Take 2 tablets (1,000 mg total) by mouth every 6 (six) hours as needed. 30 tablet 0  . atorvastatin (LIPITOR) 80 MG tablet TAKE 1 TABLET BY MOUTH ONCE DAILY 90 tablet 1  . clopidogrel (PLAVIX) 75 MG tablet TAKE 1 TABLET BY MOUTH ONCE DAILY 90 tablet 1  . DULoxetine (CYMBALTA) 30 MG capsule Take 1 capsule (30 mg total) by mouth daily. 14 capsule 0  . DULoxetine (CYMBALTA) 60 MG capsule TAKE 1 CAPSULE(60 MG) BY MOUTH DAILY 90 capsule 1  . JANUVIA 50 MG tablet Take 50 mg by mouth daily.  0  . levothyroxine (SYNTHROID, LEVOTHROID) 88 MCG tablet Take 1 tablet (88 mcg total) by mouth daily. 90 tablet 3  . losartan (COZAAR) 25 MG tablet TAKE 1 TABLET(25 MG) BY MOUTH DAILY 90 tablet 1  . metFORMIN (GLUCOPHAGE) 1000 MG tablet TAKE 1 TABLET BY MOUTH TWICE DAILY WITH MEALS 180 tablet 1  . metoprolol tartrate (LOPRESSOR) 25 MG tablet TAKE 1 TABLET BY MOUTH TWICE DAILY 180 tablet 1  . nitroGLYCERIN (NITROSTAT) 0.4 MG SL tablet Place 1 tablet (0.4  mg total) under the tongue every 5 (five) minutes as needed. For chest pain 25 tablet 5  . pantoprazole (PROTONIX) 40 MG tablet TAKE 1 TABLET BY MOUTH TWICE DAILY 180 tablet 1  . QUEtiapine (SEROQUEL) 25 MG tablet Take 1 tablet (25 mg total) by mouth at bedtime. 30 tablet 5  . RA ASPIRIN EC 81 MG EC tablet take 1 tablet by mouth once daily . OVERDUE FOR FOLLOW UP. PLEASE CALL AND SCHEDULE [phone]. 15 tablet 0   No current facility-administered medications on file prior to visit.     Past Medical History:    Diagnosis Date  . Anemia   . Anxiety   . Arthritis    "fingers" (05/19/2016  . CAD (coronary artery disease)    a. s/p CABG 1982 b. redo CABG 2003. c. [place] despite med rx 05/2016: successful but complicated PCI of PDA beyond graft site, c/b localized dissection requiring overlapping stent.  . Carotid bruit    a. Duplex 01/2015: patent vessels, 1-39% BICA, f/u PRN recommened.  . Colon polyp    adenomatous  . Colon, diverticulosis   . Degenerative joint disease   . Diastolic dysfunction   . Esophageal stricture   . GERD (gastroesophageal reflux disease)   . Hiatal hernia   . History of blood transfusion    "related to my bypass"  . Hyperlipidemia   . Hypertension   . Hypothyroidism   . Ischemic cardiomyopathy    a. Cath 05/2016: EF 40% with severe inferior wall HK, suspect hibernating myocardium.  . Myocardial infarction ([place]) 1977; 1982  . Osteoarthrosis, unspecified whether generalized or localized, unspecified site   . Presence of permanent cardiac pacemaker   . PVD (peripheral vascular disease) ([place])   . Type II diabetes mellitus ([place])   . Unspecified hearing loss     Past Surgical History:  Procedure Laterality Date  . ABDOMINAL HYSTERECTOMY    . APPENDECTOMY    . BACK SURGERY    . CARDIAC CATHETERIZATION N/A 05/19/2016   Procedure: Left Heart Cath and Cors/Grafts Angiography;  Surgeon: Belva Crome, MD;  Location: [facility];  Service: Cardiovascular;  Laterality: N/A;  . CARDIAC CATHETERIZATION N/A 05/19/2016   Procedure: Coronary Stent Intervention;  Surgeon: Belva Crome, MD;  Location: [facility];  Service: Cardiovascular;  Laterality: N/A;  . CATARACT EXTRACTION W/ INTRAOCULAR LENS  IMPLANT, BILATERAL Bilateral   . CHOLECYSTECTOMY OPEN    . COLONOSCOPY    . CORONARY ANGIOPLASTY WITH STENT PLACEMENT  05/19/2016   "2 stents?"  . CORONARY ARTERY BYPASS GRAFT  1982; 2003   x4(1982),02-2002 CABG X2  . ESOPHAGOGASTRODUODENOSCOPY (EGD) WITH ESOPHAGEAL  DILATION  "2-3 times"  . INSERT / REPLACE / REMOVE PACEMAKER    . LEFT HEART CATHETERIZATION WITH CORONARY/GRAFT ANGIOGRAM N/A 03/05/2014   Procedure: LEFT HEART CATHETERIZATION WITH Beatrix Fetters;  Surgeon: Sinclair Grooms, MD;  Location[facility];  Service: Cardiovascular;  Laterality: N/A;  . LUMBAR [place] SURGERY  01-2000  . OOPHORECTOMY Bilateral   . PERMANENT PACEMAKER INSERTION N/A 07/06/2012   MDT Adapta L implanted by Dr Rayann Heman for symptomatic bradycardia  . TONSILLECTOMY  1930s    Social History   Socioeconomic History  . Marital status: Married    Spouse name: [name]  . Number of children: 3  . Years of education: 61  . Highest education level: Not on file  Occupational History  . Occupation: Retired     Fish farm manager: RETIRED  Social  Needs  . Financial resource strain: Not hard at all  . Food insecurity:    Worry: Never true    Inability: Never true  . Transportation needs:    Medical: No    Non-medical: No  Tobacco Use  . Smoking status: Former Smoker    Packs/day: 0.50    Years: 30.00    Pack years: 15.00    Types: Cigarettes    Last attempt to quit: 07/05/1976    Years since quitting: 42.6  . Smokeless tobacco: Never Used  Substance and Sexual Activity  . Alcohol use: No    Alcohol/week: 3.0 standard drinks    Types: 3 Glasses of wine per week    Frequency: Never    Comment: "not anymore, I haven't in I don't know years"  . Drug use: No  . Sexual activity: Never  Lifestyle  . Physical activity:    Days per week: 3 days    Minutes per session: 20 min  . Stress: Not at all  Relationships  . Social connections:    Talks on phone: More than three times a week    Gets together: Three times a week    Attends religious service: 1 to 4 times per year    Active member of club or organization: No    Attends meetings of clubs or organizations: Never    Relationship status: Married  Other Topics Concern  . Not on file  Social History Narrative    Patient is married [name]) and lives at home with her husband.   Patient has three adult children.   Patient is retired.   Patient has a college education.   Patient is right-handed.   Patient does not drink any caffeine.    Family History  Problem Relation Age of Onset  . Heart disease Sister   . Heart attack Sister   . Colon cancer Neg Hx   . Breast cancer Neg Hx   . Celiac disease Neg Hx   . Cirrhosis Neg Hx   . Clotting disorder Neg Hx   . Colitis Neg Hx   . Colon polyps Neg Hx   . Crohn's disease Neg Hx   . Cystic fibrosis Neg Hx   . Diabetes Neg Hx   . Esophageal cancer Neg Hx   . Hemochromatosis Neg Hx   . Inflammatory bowel disease Neg Hx   . Irritable bowel syndrome Neg Hx   . Kidney disease Neg Hx   . Liver cancer Neg Hx   . Liver disease Neg Hx   . Ovarian cancer Neg Hx   . Pancreatic cancer Neg Hx   . Prostate cancer Neg Hx   . Rectal cancer Neg Hx   . Stomach cancer Neg Hx   . Ulcerative colitis Neg Hx   . Uterine cancer Neg Hx   . Wilson's disease Neg Hx     Review of Systems  Constitutional: Negative for chills and fever.  Respiratory: Negative for cough, shortness of breath and wheezing.   Cardiovascular: Negative for chest pain, palpitations and leg swelling.  Gastrointestinal: Positive for abdominal pain and constipation. Negative for nausea.  Genitourinary: Negative for dysuria, frequency and hematuria.  Neurological: Negative for light-headedness and headaches.       Objective:   Vitals:   02/05/19 1433  BP: 134/70  Pulse: 82  Resp: 16  Temp: 97.6 F (36.4 C)  SpO2: 97%   BP Readings from Last 3 Encounters:  02/05/19 134/70  01/02/19 118/64  01/02/19 118/64   Wt Readings from Last 3 Encounters:  02/05/19 107 lb 12.8 oz (48.9 kg)  01/02/19 104 lb (47.2 kg)  01/02/19 104 lb (47.2 kg)   Body mass index is 20.37 kg/m.   Physical Exam Constitutional:      Appearance: She is well-developed.  Abdominal:     General: There is  no distension.     Palpations: Abdomen is soft.     Tenderness: There is abdominal tenderness (across lower abdomen). There is no right CVA tenderness, left CVA tenderness, guarding or rebound.  Skin:    General: Skin is warm and dry.  Neurological:     Mental Status: She is alert.            Assessment & Plan:    See Problem List for Assessment and Plan of chronic medical problems.

## 2019-02-06 LAB — URINE CULTURE
MICRO NUMBER: 263411
SPECIMEN QUALITY:: ADEQUATE

## 2019-02-18 ENCOUNTER — Other Ambulatory Visit: Payer: Self-pay | Admitting: Internal Medicine

## 2019-03-14 ENCOUNTER — Other Ambulatory Visit: Payer: Self-pay | Admitting: Internal Medicine

## 2019-03-14 DIAGNOSIS — E1165 Type 2 diabetes mellitus with hyperglycemia: Secondary | ICD-10-CM

## 2019-03-20 ENCOUNTER — Other Ambulatory Visit: Payer: Self-pay | Admitting: Internal Medicine

## 2019-04-15 ENCOUNTER — Other Ambulatory Visit: Payer: Self-pay | Admitting: Internal Medicine

## 2019-04-15 DIAGNOSIS — I200 Unstable angina: Secondary | ICD-10-CM

## 2019-04-15 DIAGNOSIS — I25700 Atherosclerosis of coronary artery bypass graft(s), unspecified, with unstable angina pectoris: Secondary | ICD-10-CM

## 2019-04-17 DIAGNOSIS — L218 Other seborrheic dermatitis: Secondary | ICD-10-CM | POA: Diagnosis not present

## 2019-04-17 DIAGNOSIS — Z85828 Personal history of other malignant neoplasm of skin: Secondary | ICD-10-CM | POA: Diagnosis not present

## 2019-04-25 DIAGNOSIS — H353211 Exudative age-related macular degeneration, right eye, with active choroidal neovascularization: Secondary | ICD-10-CM | POA: Diagnosis not present

## 2019-04-25 DIAGNOSIS — H353122 Nonexudative age-related macular degeneration, left eye, intermediate dry stage: Secondary | ICD-10-CM | POA: Diagnosis not present

## 2019-06-14 DIAGNOSIS — M545 Low back pain: Secondary | ICD-10-CM | POA: Diagnosis not present

## 2019-06-14 DIAGNOSIS — M25551 Pain in right hip: Secondary | ICD-10-CM | POA: Diagnosis not present

## 2019-06-15 ENCOUNTER — Other Ambulatory Visit: Payer: Self-pay | Admitting: Orthopaedic Surgery

## 2019-06-15 DIAGNOSIS — M545 Low back pain, unspecified: Secondary | ICD-10-CM

## 2019-06-19 ENCOUNTER — Ambulatory Visit
Admission: RE | Admit: 2019-06-19 | Discharge: 2019-06-19 | Disposition: A | Discharge status: Home / Self Care | Payer: Medicare Other | Source: Ambulatory Visit | Attending: Orthopaedic Surgery | Admitting: Orthopaedic Surgery

## 2019-06-19 DIAGNOSIS — M545 Low back pain, unspecified: Secondary | ICD-10-CM

## 2019-07-03 ENCOUNTER — Ambulatory Visit: Payer: Medicare Other | Admitting: Internal Medicine

## 2019-07-11 ENCOUNTER — Ambulatory Visit: Payer: Medicare Other | Admitting: Internal Medicine

## 2019-07-13 ENCOUNTER — Ambulatory Visit: Payer: Medicare Other | Admitting: Internal Medicine

## 2019-07-14 ENCOUNTER — Other Ambulatory Visit: Payer: Self-pay | Admitting: Internal Medicine

## 2019-07-16 ENCOUNTER — Other Ambulatory Visit: Payer: Self-pay | Admitting: Internal Medicine

## 2019-07-19 NOTE — Progress Notes (Signed)
Virtual Visit via telephone Note  I connected with NATOYA VISCOMI on 07/20/19 at  2:15 PM EDT by telephone and verified that I am speaking with the correct person using two identifiers.   I discussed the limitations of evaluation and management by telemedicine and the availability of in person appointments. The patient expressed understanding and agreed to proceed.  The patient is currently at home and I am in the office.    No referring provider.    History of Present Illness: This is a follow up visit and a visit for fever and headaches.   She has dementia and is a poor historian.   Fever, headaches:  No fever in the past couple of days.  She then denies ever having a fever - she just felt feverish.  She does have frequent headaches, which is not new.  She takes aleve as needed and is taking it often - she is not able to clarify how often.  She does have a cough, but it is chronic.  She sometimes brings up sputum.    Diabetes: She is taking her medication daily as prescribed. She is compliant with a diabetic diet.   .   Hypertension: She is taking her medication daily. She is compliant with a low sodium diet.  She denies chest pain, palpitations, edema, shortness of breath.   Hyperlipidemia: She is taking her medication daily. She is compliant with a low fat/cholesterol diet. She denies myalgias.   Hypothyroidism:  She is taking her medication daily.  She denies any recent changes in energy or weight that are unexplained.   H/o CVA with right sided weakness:  She is taking the plavix,lipitor daily.    GERD:  She is taking her medication daily as prescribed.  She denies any GERD symptoms and feels her GERD is well controlled.   Insomnia:  She is taking the seroquel nightly.  Her sleep is just ok.    Anxiety, Depression: She is taking her medication daily as prescribed. She denies any side effects from the medication. She feels her depression and anxiety are fairly controlled.      Review of Systems  Constitutional: Negative for fever.  HENT: Negative for congestion and sore throat.   Respiratory: Positive for cough. Negative for shortness of breath and wheezing.   Cardiovascular: Negative for chest pain, palpitations and leg swelling.  Gastrointestinal: Negative for abdominal pain.  Neurological: Positive for headaches.     Social History   Socioeconomic History  . Marital status: Married    Spouse name: [name]  . Number of children: 3  . Years of education: 39  . Highest education level: Not on file  Occupational History  . Occupation: Retired     Fish farm manager: RETIRED  Social Needs  . Financial resource strain: Not hard at all  . Food insecurity    Worry: Never true    Inability: Never true  . Transportation needs    Medical: No    Non-medical: No  Tobacco Use  . Smoking status: Former Smoker    Packs/day: 0.50    Years: 30.00    Pack years: 15.00    Types: Cigarettes    Quit date: 07/05/1976    Years since quitting: 43.0  . Smokeless tobacco: Never Used  Substance and Sexual Activity  . Alcohol use: No    Alcohol/week: 3.0 standard drinks    Types: 3 Glasses of wine per week    Frequency: Never    Comment: "not anymore,  I haven't in I don't know years"  . Drug use: No  . Sexual activity: Never  Lifestyle  . Physical activity    Days per week: 3 days    Minutes per session: 20 min  . Stress: Not at all  Relationships  . Social connections    Talks on phone: More than three times a week    Gets together: Three times a week    Attends religious service: 1 to 4 times per year    Active member of club or organization: No    Attends meetings of clubs or organizations: Never    Relationship status: Married  Other Topics Concern  . Not on file  Social History Narrative   Patient is married [name]) and lives at home with her husband.   Patient has three adult children.   Patient is retired.   Patient has a college education.    Patient is right-handed.   Patient does not drink any caffeine.     Observations/Objective:  BP Readings from Last 3 Encounters:  02/05/19 134/70  01/02/19 118/64  01/02/19 118/64    Lab Results  Component Value Date   WBC 7.0 01/02/2019   HGB 11.8 (L) 01/02/2019   HCT 36.0 01/02/2019   PLT 283.0 01/02/2019   GLUCOSE 145 (H) 01/02/2019   CHOL 154 01/02/2019   TRIG 104.0 01/02/2019   HDL 37.20 (L) 01/02/2019   LDLDIRECT 254.0 11/01/2017   LDLCALC 96 01/02/2019   ALT 12 01/02/2019   AST 14 01/02/2019   NA 139 01/02/2019   K 4.8 01/02/2019   CL 105 01/02/2019   CREATININE 1.16 01/02/2019   BUN 26 (H) 01/02/2019   CO2 26 01/02/2019   TSH 0.46 01/02/2019   INR 0.99 04/04/2018   HGBA1C 6.4 01/02/2019   MICROALBUR 3.6 (H) 05/28/2010      Assessment and Plan:  See Problem List for Assessment and Plan of chronic medical problems.   Follow Up Instructions:    I discussed the assessment and treatment plan with the patient. The patient was provided an opportunity to ask questions and all were answered. The patient agreed with the plan and demonstrated an understanding of the instructions.   The patient was advised to call back or seek an in-person evaluation if the symptoms worsen or if the condition fails to improve as anticipated.  Time spent on telephone call:  18 minutes   FU in 3 months   Binnie Rail, MD

## 2019-07-20 ENCOUNTER — Ambulatory Visit (INDEPENDENT_AMBULATORY_CARE_PROVIDER_SITE_OTHER): Payer: Medicare Other | Admitting: Internal Medicine

## 2019-07-20 ENCOUNTER — Encounter: Payer: Self-pay | Admitting: Internal Medicine

## 2019-07-20 DIAGNOSIS — F419 Anxiety disorder, unspecified: Secondary | ICD-10-CM

## 2019-07-20 DIAGNOSIS — I6930 Unspecified sequelae of cerebral infarction: Secondary | ICD-10-CM

## 2019-07-20 DIAGNOSIS — R05 Cough: Secondary | ICD-10-CM | POA: Diagnosis not present

## 2019-07-20 DIAGNOSIS — G4489 Other headache syndrome: Secondary | ICD-10-CM | POA: Diagnosis not present

## 2019-07-20 DIAGNOSIS — R059 Cough, unspecified: Secondary | ICD-10-CM

## 2019-07-20 DIAGNOSIS — E782 Mixed hyperlipidemia: Secondary | ICD-10-CM | POA: Diagnosis not present

## 2019-07-20 DIAGNOSIS — F3289 Other specified depressive episodes: Secondary | ICD-10-CM

## 2019-07-20 DIAGNOSIS — E039 Hypothyroidism, unspecified: Secondary | ICD-10-CM | POA: Diagnosis not present

## 2019-07-20 DIAGNOSIS — I10 Essential (primary) hypertension: Secondary | ICD-10-CM | POA: Diagnosis not present

## 2019-07-20 DIAGNOSIS — E1165 Type 2 diabetes mellitus with hyperglycemia: Secondary | ICD-10-CM | POA: Diagnosis not present

## 2019-07-21 DIAGNOSIS — R519 Headache, unspecified: Secondary | ICD-10-CM | POA: Insufficient documentation

## 2019-07-21 DIAGNOSIS — G4486 Cervicogenic headache: Secondary | ICD-10-CM | POA: Insufficient documentation

## 2019-07-21 NOTE — Assessment & Plan Note (Signed)
Lab Results  Component Value Date   TSH 0.46 01/02/2019   Will recheck tsh at next visit Continue current medication

## 2019-07-21 NOTE — Assessment & Plan Note (Signed)
Continue statin. 

## 2019-07-21 NOTE — Assessment & Plan Note (Signed)
Controlled, stable Continue current dose of medication  

## 2019-07-21 NOTE — Assessment & Plan Note (Signed)
BP Readings from Last 3 Encounters:  02/05/19 134/70  01/02/19 118/64  01/02/19 118/64   BP well controlled Current regimen effective and well tolerated Continue current medications at current doses

## 2019-07-21 NOTE — Assessment & Plan Note (Signed)
Frequent - ? Daily - she is a poor historian due to mild dementia Taking aleve frequently - possibly daily Avoid aleve - possible rebound headaches Had CT of head last year Follow up in office if headaches persist

## 2019-07-21 NOTE — Assessment & Plan Note (Signed)
Continue plavix, lipitor

## 2019-07-21 NOTE — Assessment & Plan Note (Signed)
Lab Results  Component Value Date   HGBA1C 6.4 01/02/2019   Continue current medication Check a1c at next visit

## 2019-07-21 NOTE — Assessment & Plan Note (Signed)
Chronic, productive at times Advised visit in the office for further evaluation if no improvement Will get cxr and consider pulm referral

## 2019-07-27 DIAGNOSIS — H353122 Nonexudative age-related macular degeneration, left eye, intermediate dry stage: Secondary | ICD-10-CM | POA: Diagnosis not present

## 2019-07-27 DIAGNOSIS — H35033 Hypertensive retinopathy, bilateral: Secondary | ICD-10-CM | POA: Diagnosis not present

## 2019-07-27 DIAGNOSIS — H43813 Vitreous degeneration, bilateral: Secondary | ICD-10-CM | POA: Diagnosis not present

## 2019-07-27 DIAGNOSIS — H353211 Exudative age-related macular degeneration, right eye, with active choroidal neovascularization: Secondary | ICD-10-CM | POA: Diagnosis not present

## 2019-07-31 ENCOUNTER — Telehealth: Payer: Self-pay

## 2019-07-31 DIAGNOSIS — R519 Headache, unspecified: Secondary | ICD-10-CM

## 2019-07-31 NOTE — Telephone Encounter (Signed)
Copied from [place] [phone]. Topic: General - Other >> Jul 30, 2019  4:51 PM Jeri Cos wrote: Reason for CRM: Pt husband would like to speak with Dr. Quay Burow or her nurse. Pt stated that his wife continues to have headaches and would like to speak with someone before making another appt. Please call pt back.

## 2019-07-31 NOTE — Telephone Encounter (Signed)
Spoke with husband and he advised that pt has had bad headaches lately. Explains it at "peircing" headaches. No vision changes or any other symptoms. Has not been checking BP. Would like to know what you would advise. Would you like a visit? I told him that you prob would and he was ok with it if you prefer. Apparently you have seen her for this in the past. Please advise

## 2019-07-31 NOTE — Telephone Encounter (Signed)
A CT scan was done in September last year of pts head and husband asked about results. We went over results again and he stated he did not think she needed another scan if that scan was normal. Would like to have the referral to neurology.

## 2019-07-31 NOTE — Telephone Encounter (Signed)
Referral was ordered for neurology

## 2019-07-31 NOTE — Telephone Encounter (Signed)
We have discussed that headaches.  We can go ahead and order some imaging if they would like.  Can she have an MRI?  If not she would need to have a CT scan of her head.  Depending on the results she may need to see neurology.

## 2019-08-01 NOTE — Telephone Encounter (Signed)
Pts husband aware of referral.

## 2019-08-06 DIAGNOSIS — H353211 Exudative age-related macular degeneration, right eye, with active choroidal neovascularization: Secondary | ICD-10-CM | POA: Diagnosis not present

## 2019-08-06 DIAGNOSIS — H52203 Unspecified astigmatism, bilateral: Secondary | ICD-10-CM | POA: Diagnosis not present

## 2019-08-06 DIAGNOSIS — E119 Type 2 diabetes mellitus without complications: Secondary | ICD-10-CM | POA: Diagnosis not present

## 2019-08-06 DIAGNOSIS — H353122 Nonexudative age-related macular degeneration, left eye, intermediate dry stage: Secondary | ICD-10-CM | POA: Diagnosis not present

## 2019-08-06 LAB — HM DIABETES EYE EXAM

## 2019-08-08 ENCOUNTER — Encounter: Payer: Self-pay | Admitting: Internal Medicine

## 2019-08-13 ENCOUNTER — Other Ambulatory Visit: Payer: Self-pay | Admitting: Internal Medicine

## 2019-08-16 NOTE — Progress Notes (Signed)
Subjective:    Patient ID: Janet Steele, female    DOB: 08-21-1929, 85 y.o.   MRN: [id number]  HPI The patient is here for an acute visit.  She had many things she wanted to talk to me about now she is not recall anything she wanted to talk to me about.  She is due for a follow-up.  Regular headaches: She states frequent headaches.  She is scheduled to see neurology next month.  She is a poor historian because of her dementia.  She states that headaches come and go and are more like sharp pain that are transient.  Hypertension: She is taking her medication daily. She is compliant with a low sodium diet.  She denies chest pain, palpitations, edema, shortness of breath.   Diabetes: She is taking her medication daily as prescribed. She is compliant with a diabetic diet.   She monitors her sugars at home and states they are good.  She does not recall any of her numbers.  She is up-to-date with an ophthalmology examination.   Hypothyroidism:  She is taking her medication daily.  She denies any recent changes in energy or weight that are unexplained.    Hyperlipidemia: She is taking her medication daily. She is compliant with a low fat/cholesterol diet. She denies myalgias.    GERD:  She is taking her medication daily as prescribed.  She denies any GERD symptoms and feels her GERD is well controlled.     Medications and allergies reviewed with patient and updated if appropriate.  Patient Active Problem List   Diagnosis Date Noted  . Headache 07/21/2019  . Lower abdominal pain 02/05/2019  . Other headache syndrome 08/14/2018  . Depression 08/14/2018  . Weight loss 06/05/2018  . Stroke ([place]) 11/04/2017  . Weakness 11/03/2017  . History of cerebrovascular accident (CVA) with residual deficit 11/03/2017  . Chronic back pain 07/04/2017  . PAD (peripheral artery disease) ([place]) 01/17/2017  . Overactive bladder 12/17/2016  . Anemia 05/20/2016  . Coronary artery disease involving  coronary bypass graft of native heart without angina pectoris 05/19/2016  . Numbness and tingling of foot 12/31/2015  . MCI (mild cognitive impairment) with memory loss 11/18/2015  . Facial tic 05/07/2015  . Sick sinus syndrome ([place]) 02/26/2015  . Cervical dystonia 12/04/2014  . Chronic motor tic 09/12/2014  . Pacemaker-Medtronic 07/07/2012  . GERD (gastroesophageal reflux disease) 07/05/2012  . Cough 08/04/2010  . Abnormal involuntary movement 12/17/2009  . Disturbance in sleep behavior 12/11/2009  . CAD, ARTERY BYPASS GRAFT 09/24/2009  . CAROTID BRUIT 09/24/2009  . Diabetes ([place]) 01/30/2009  . DYSPHAGIA 06/19/2008  . Anxiety 02/14/2008  . EROSIVE ESOPHAGITIS 02/14/2008  . CONSTIPATION, CHRONIC 02/14/2008  . DEGENERATIVE JOINT DISEASE 02/14/2008  . HEARING LOSS 01/04/2008  . PVD 01/04/2008  . Hypothyroidism 09/25/2007  . HYPERLIPIDEMIA 09/25/2007  . Essential hypertension 09/07/2007  . Diabetic polyneuropathy ([place]) 03/16/2007  . Stricture and stenosis of esophagus 03/16/2007  . HIATAL HERNIA 02/16/2007  . HEMORRHOIDS 12/31/1997  . DIVERTICULOSIS, COLON 12/31/1997    Current Outpatient Medications on File Prior to Visit  Medication Sig Dispense Refill  . acetaminophen (TYLENOL) 500 MG tablet Take 2 tablets (1,000 mg total) by mouth every 6 (six) hours as needed. 30 tablet 0  . atorvastatin (LIPITOR) 80 MG tablet TAKE 1 TABLET BY MOUTH EVERY DAY 90 tablet 1  . clopidogrel (PLAVIX) 75 MG tablet TAKE 1 TABLET BY MOUTH ONCE DAILY 90 tablet 0  . DULoxetine (CYMBALTA) 60  MG capsule TAKE 1 CAPSULE(60 MG) BY MOUTH DAILY 90 capsule 0  . JANUVIA 50 MG tablet Take 50 mg by mouth daily.  0  . levothyroxine (SYNTHROID) 88 MCG tablet TAKE 1 TABLET(88 MCG) BY MOUTH DAILY 90 tablet 0  . losartan (COZAAR) 25 MG tablet TAKE 1 TABLET(25 MG) BY MOUTH DAILY 90 tablet 0  . metFORMIN (GLUCOPHAGE) 1000 MG tablet Take 1 tablet (1,000 mg total) by mouth 2 (two) times daily with a meal. 180 tablet 1   . metoprolol tartrate (LOPRESSOR) 25 MG tablet TAKE 1 TABLET BY MOUTH TWICE DAILY 180 tablet 0  . nitroGLYCERIN (NITROSTAT) 0.4 MG SL tablet Place 1 tablet (0.4 mg total) under the tongue every 5 (five) minutes as needed. For chest pain 25 tablet 5  . pantoprazole (PROTONIX) 40 MG tablet TAKE 1 TABLET BY MOUTH TWICE DAILY 180 tablet 0  . QUEtiapine (SEROQUEL) 25 MG tablet TAKE 1 TABLET BY MOUTH AT BEDTIME- START AFTER MIRTAZIPINE IS TAPERED OFF 30 tablet 5  . RA ASPIRIN EC 81 MG EC tablet take 1 tablet by mouth once daily . OVERDUE FOR FOLLOW UP. PLEASE CALL AND SCHEDULE [phone]. 15 tablet 0   No current facility-administered medications on file prior to visit.     Past Medical History:  Diagnosis Date  . Anemia   . Anxiety   . Arthritis    "fingers" (05/19/2016  . CAD (coronary artery disease)    a. s/p CABG 1982 b. redo CABG 2003. c. [place] despite med rx 05/2016: successful but complicated PCI of PDA beyond graft site, c/b localized dissection requiring overlapping stent.  . Carotid bruit    a. Duplex 01/2015: patent vessels, 1-39% BICA, f/u PRN recommened.  . Colon polyp    adenomatous  . Colon, diverticulosis   . Degenerative joint disease   . Diastolic dysfunction   . Esophageal stricture   . GERD (gastroesophageal reflux disease)   . Hiatal hernia   . History of blood transfusion    "related to my bypass"  . Hyperlipidemia   . Hypertension   . Hypothyroidism   . Ischemic cardiomyopathy    a. Cath 05/2016: EF 40% with severe inferior wall HK, suspect hibernating myocardium.  . Myocardial infarction ([place]) 1977; 1982  . Osteoarthrosis, unspecified whether generalized or localized, unspecified site   . Presence of permanent cardiac pacemaker   . PVD (peripheral vascular disease) ([place])   . Type II diabetes mellitus ([place])   . Unspecified hearing loss     Past Surgical History:  Procedure Laterality Date  . ABDOMINAL HYSTERECTOMY    . APPENDECTOMY    . BACK SURGERY    .  CARDIAC CATHETERIZATION N/A 05/19/2016   Procedure: Left Heart Cath and Cors/Grafts Angiography;  Surgeon: Belva Crome, MD;  Location: [facility];  Service: Cardiovascular;  Laterality: N/A;  . CARDIAC CATHETERIZATION N/A 05/19/2016   Procedure: Coronary Stent Intervention;  Surgeon: Belva Crome, MD;  Location: [facility];  Service: Cardiovascular;  Laterality: N/A;  . CATARACT EXTRACTION W/ INTRAOCULAR LENS  IMPLANT, BILATERAL Bilateral   . CHOLECYSTECTOMY OPEN    . COLONOSCOPY    . CORONARY ANGIOPLASTY WITH STENT PLACEMENT  05/19/2016   "2 stents?"  . CORONARY ARTERY BYPASS GRAFT  1982; 2003   x4(1982),02-2002 CABG X2  . ESOPHAGOGASTRODUODENOSCOPY (EGD) WITH ESOPHAGEAL DILATION  "2-3 times"  . INSERT / REPLACE / REMOVE PACEMAKER    . LEFT HEART CATHETERIZATION WITH CORONARY/GRAFT ANGIOGRAM N/A 03/05/2014  Procedure: LEFT HEART CATHETERIZATION WITH Beatrix Fetters;  Surgeon: Sinclair Grooms, MD;  Location: [facility];  Service: Cardiovascular;  Laterality: N/A;  . LUMBAR [place] SURGERY  01-2000  . OOPHORECTOMY Bilateral   . PERMANENT PACEMAKER INSERTION N/A 07/06/2012   MDT Adapta L implanted by Dr Rayann Heman for symptomatic bradycardia  . TONSILLECTOMY  1930s    Social History   Socioeconomic History  . Marital status: Married    Spouse name: [name]  . Number of children: 3  . Years of education: 47  . Highest education level: Not on file  Occupational History  . Occupation: Retired     Fish farm manager: RETIRED  Social Needs  . Financial resource strain: Not hard at all  . Food insecurity    Worry: Never true    Inability: Never true  . Transportation needs    Medical: No    Non-medical: No  Tobacco Use  . Smoking status: Former Smoker    Packs/day: 0.50    Years: 30.00    Pack years: 15.00    Types: Cigarettes    Quit date: 07/05/1976    Years since quitting: 43.1  . Smokeless tobacco: Never Used  Substance and Sexual Activity  . Alcohol use: No     Alcohol/week: 3.0 standard drinks    Types: 3 Glasses of wine per week    Frequency: Never    Comment: "not anymore, I haven't in I don't know years"  . Drug use: No  . Sexual activity: Never  Lifestyle  . Physical activity    Days per week: 3 days    Minutes per session: 20 min  . Stress: Not at all  Relationships  . Social connections    Talks on phone: More than three times a week    Gets together: Three times a week    Attends religious service: 1 to 4 times per year    Active member of club or organization: No    Attends meetings of clubs or organizations: Never    Relationship status: Married  Other Topics Concern  . Not on file  Social History Narrative   Patient is married [name]) and lives at home with her husband.   Patient has three adult children.   Patient is retired.   Patient has a college education.   Patient is right-handed.   Patient does not drink any caffeine.    Family History  Problem Relation Age of Onset  . Heart disease Sister   . Heart attack Sister   . Colon cancer Neg Hx   . Breast cancer Neg Hx   . Celiac disease Neg Hx   . Cirrhosis Neg Hx   . Clotting disorder Neg Hx   . Colitis Neg Hx   . Colon polyps Neg Hx   . Crohn's disease Neg Hx   . Cystic fibrosis Neg Hx   . Diabetes Neg Hx   . Esophageal cancer Neg Hx   . Hemochromatosis Neg Hx   . Inflammatory bowel disease Neg Hx   . Irritable bowel syndrome Neg Hx   . Kidney disease Neg Hx   . Liver cancer Neg Hx   . Liver disease Neg Hx   . Ovarian cancer Neg Hx   . Pancreatic cancer Neg Hx   . Prostate cancer Neg Hx   . Rectal cancer Neg Hx   . Stomach cancer Neg Hx   . Ulcerative colitis Neg Hx   . Uterine cancer Neg Hx   .  Wilson's disease Neg Hx     Review of Systems  Constitutional: Negative for appetite change, chills, fatigue and fever.  HENT:       Hiccups  Respiratory: Negative for cough, shortness of breath and wheezing.   Cardiovascular: Negative for chest pain,  palpitations and leg swelling.  Gastrointestinal: Negative for abdominal pain, constipation and diarrhea.  Neurological: Positive for light-headedness (sometimes) and headaches (daily). Negative for dizziness.       Objective:   Vitals:   08/17/19 1101  BP: 140/72  Resp: 16  Temp: 98.2 F (36.8 C)   BP Readings from Last 3 Encounters:  08/17/19 140/72  02/05/19 134/70  01/02/19 118/64   Wt Readings from Last 3 Encounters:  08/17/19 110 lb (49.9 kg)  02/05/19 107 lb 12.8 oz (48.9 kg)  01/02/19 104 lb (47.2 kg)   Body mass index is 20.78 kg/m.   Physical Exam    Constitutional: Appears well-developed and well-nourished. No distress.  HENT:  Head: Normocephalic and atraumatic.  Neck: Neck supple. No tracheal deviation present. No thyromegaly present.  No cervical lymphadenopathy Cardiovascular: Normal rate, regular rhythm and normal heart sounds.   No murmur heard. No carotid bruit .  No edema Pulmonary/Chest: Effort normal and breath sounds normal. No respiratory distress. No has no wheezes. No rales.  Skin: Skin is warm and dry. Not diaphoretic.  Psychiatric: Normal mood and affect. Behavior is normal.       Assessment & Plan:    See Problem List for Assessment and Plan of chronic medical problems.

## 2019-08-17 ENCOUNTER — Other Ambulatory Visit (INDEPENDENT_AMBULATORY_CARE_PROVIDER_SITE_OTHER): Payer: Medicare Other

## 2019-08-17 ENCOUNTER — Other Ambulatory Visit: Payer: Self-pay

## 2019-08-17 ENCOUNTER — Ambulatory Visit (INDEPENDENT_AMBULATORY_CARE_PROVIDER_SITE_OTHER): Payer: Medicare Other | Admitting: Internal Medicine

## 2019-08-17 ENCOUNTER — Encounter: Payer: Self-pay | Admitting: Internal Medicine

## 2019-08-17 VITALS — BP 140/72 | Temp 98.2°F | Resp 16 | Ht 61.0 in | Wt 110.0 lb

## 2019-08-17 DIAGNOSIS — E1165 Type 2 diabetes mellitus with hyperglycemia: Secondary | ICD-10-CM

## 2019-08-17 DIAGNOSIS — E039 Hypothyroidism, unspecified: Secondary | ICD-10-CM | POA: Diagnosis not present

## 2019-08-17 DIAGNOSIS — E782 Mixed hyperlipidemia: Secondary | ICD-10-CM

## 2019-08-17 DIAGNOSIS — I10 Essential (primary) hypertension: Secondary | ICD-10-CM | POA: Diagnosis not present

## 2019-08-17 DIAGNOSIS — K219 Gastro-esophageal reflux disease without esophagitis: Secondary | ICD-10-CM | POA: Diagnosis not present

## 2019-08-17 DIAGNOSIS — Z23 Encounter for immunization: Secondary | ICD-10-CM | POA: Diagnosis not present

## 2019-08-17 DIAGNOSIS — I200 Unstable angina: Secondary | ICD-10-CM

## 2019-08-17 LAB — CBC WITH DIFFERENTIAL/PLATELET
Basophils Absolute: 0.1 10*3/uL (ref 0.0–0.1)
Basophils Relative: 0.8 % (ref 0.0–3.0)
Eosinophils Absolute: 0.1 10*3/uL (ref 0.0–0.7)
Eosinophils Relative: 1.6 % (ref 0.0–5.0)
HCT: 36.1 % (ref 36.0–46.0)
Hemoglobin: 11.9 g/dL — ABNORMAL LOW (ref 12.0–15.0)
Lymphocytes Relative: 19.7 % (ref 12.0–46.0)
Lymphs Abs: 1.7 10*3/uL (ref 0.7–4.0)
MCHC: 33.1 g/dL (ref 30.0–36.0)
MCV: 84.9 fl (ref 78.0–100.0)
Monocytes Absolute: 0.5 10*3/uL (ref 0.1–1.0)
Monocytes Relative: 5.6 % (ref 3.0–12.0)
Neutro Abs: 6.1 10*3/uL (ref 1.4–7.7)
Neutrophils Relative %: 72.3 % (ref 43.0–77.0)
Platelets: 294 10*3/uL (ref 150.0–400.0)
RBC: 4.25 Mil/uL (ref 3.87–5.11)
RDW: 14.5 % (ref 11.5–15.5)
WBC: 8.4 10*3/uL (ref 4.0–10.5)

## 2019-08-17 LAB — COMPREHENSIVE METABOLIC PANEL
ALT: 8 U/L (ref 0–35)
AST: 12 U/L (ref 0–37)
Albumin: 4.4 g/dL (ref 3.5–5.2)
Alkaline Phosphatase: 83 U/L (ref 39–117)
BUN: 16 mg/dL (ref 6–23)
CO2: 27 mEq/L (ref 19–32)
Calcium: 9.8 mg/dL (ref 8.4–10.5)
Chloride: 104 mEq/L (ref 96–112)
Creatinine, Ser: 1.06 mg/dL (ref 0.40–1.20)
GFR: 48.94 mL/min — ABNORMAL LOW (ref >60.00)
Glucose, Bld: 135 mg/dL — ABNORMAL HIGH (ref 70–99)
Potassium: 5 mEq/L (ref 3.5–5.1)
Sodium: 139 mEq/L (ref 135–145)
Total Bilirubin: 1.1 mg/dL (ref 0.2–1.2)
Total Protein: 7.3 g/dL (ref 6.0–8.3)

## 2019-08-17 LAB — LIPID PANEL
Cholesterol: 212 mg/dL — ABNORMAL HIGH (ref 0–200)
HDL: 42 mg/dL (ref >39.00)
LDL Cholesterol: 132 mg/dL — ABNORMAL HIGH (ref 0–99)
NonHDL: 170.15
Total CHOL/HDL Ratio: 5
Triglycerides: 192 mg/dL — ABNORMAL HIGH (ref 0.0–149.0)
VLDL: 38.4 mg/dL (ref 0.0–40.0)

## 2019-08-17 LAB — HEMOGLOBIN A1C: Hgb A1c MFr Bld: 6.9 % — ABNORMAL HIGH (ref 4.6–6.5)

## 2019-08-17 LAB — TSH: TSH: 15.81 u[IU]/mL — ABNORMAL HIGH (ref 0.35–4.50)

## 2019-08-17 NOTE — Patient Instructions (Signed)
  Tests ordered today. Your results will be released to MyChart (or called to you) after review.  If any changes need to be made, you will be notified at that same time.  Flu immunization administered today.    Medications reviewed and updated.  Changes include :  none      Please followup in 6 months   

## 2019-08-18 NOTE — Assessment & Plan Note (Signed)
BP well controlled Current regimen effective and well tolerated Continue current medications at current doses cmp  

## 2019-08-18 NOTE — Assessment & Plan Note (Signed)
Taking medication daily She feels her sugars have been controlled at home, but does not recall the numbers We will check A1c

## 2019-08-18 NOTE — Assessment & Plan Note (Signed)
Clinically euthyroid Check tsh  Titrate med dose if needed  

## 2019-08-18 NOTE — Assessment & Plan Note (Signed)
Check lipid panel  Continue daily statin Regular exercise and healthy diet encouraged  

## 2019-08-18 NOTE — Assessment & Plan Note (Signed)
GERD controlled Continue daily medication  

## 2019-09-10 ENCOUNTER — Other Ambulatory Visit: Payer: Self-pay | Admitting: Internal Medicine

## 2019-09-10 DIAGNOSIS — E1165 Type 2 diabetes mellitus with hyperglycemia: Secondary | ICD-10-CM

## 2019-09-19 ENCOUNTER — Ambulatory Visit: Payer: Medicare Other | Admitting: Neurology

## 2019-09-19 ENCOUNTER — Encounter

## 2019-09-25 ENCOUNTER — Encounter: Payer: Self-pay | Admitting: Neurology

## 2019-09-25 ENCOUNTER — Ambulatory Visit (INDEPENDENT_AMBULATORY_CARE_PROVIDER_SITE_OTHER): Payer: Medicare Other | Admitting: Neurology

## 2019-09-25 ENCOUNTER — Other Ambulatory Visit: Payer: Self-pay

## 2019-09-25 VITALS — BP 116/64 | HR 71 | Temp 97.4°F | Ht 60.0 in | Wt 111.0 lb

## 2019-09-25 DIAGNOSIS — I200 Unstable angina: Secondary | ICD-10-CM | POA: Diagnosis not present

## 2019-09-25 DIAGNOSIS — R519 Headache, unspecified: Secondary | ICD-10-CM | POA: Diagnosis not present

## 2019-09-25 DIAGNOSIS — G44031 Episodic paroxysmal hemicrania, intractable: Secondary | ICD-10-CM

## 2019-09-25 DIAGNOSIS — H9201 Otalgia, right ear: Secondary | ICD-10-CM

## 2019-09-25 MED ORDER — PRAMOXINE-HC-CHLOROXYLENOL 10-10-1 MG/ML OT SOLN: OTIC | 0 refills | Status: DC

## 2019-09-25 MED ORDER — GUAIFENESIN ER 600 MG PO TB12: 600.0000 mg | ORAL_TABLET | Freq: Two times a day (BID) | ORAL | 0 refills | Status: DC

## 2019-09-25 NOTE — Patient Instructions (Addendum)
Tension Headache, Adult A tension headache is a feeling of pain, pressure, or aching in the head that is often felt over the front and sides of the head. The pain can be dull, or it can feel tight (constricting). There are two types of tension headache:  Episodic tension headache. This is when the headaches happen fewer than 15 days a month.  Chronic tension headache. This is when the headaches happen more than 15 days a month during a 11-month period. A tension headache can last from 30 minutes to several days. It is the most common kind of headache. Tension headaches are not normally associated with nausea or vomiting, and they do not get worse with physical activity. What are the causes? The exact cause of this condition is not known. Tension headaches are often triggered by stress, anxiety, or depression. Other triggers include:  Alcohol.  Too much caffeine or caffeine withdrawal.  Respiratory infections, such as colds, flu, or sinus infections.  Dental problems or teeth clenching.  Tiredness (fatigue).  Holding your head and neck in the same position for a long period of time, such as while using a computer.  Smoking.  Arthritis of the neck. What are the signs or symptoms? Symptoms of this condition include:  A feeling of pressure or tightness around the head.  Dull, aching head pain.  Pain over the front and sides of the head.  Tenderness in the muscles of the head, neck, and shoulders. How is this diagnosed? This condition may be diagnosed based on your symptoms, your medical history, and a physical exam. If your symptoms are severe or unusual, you may have imaging tests, such as a CT scan or an MRI of your head. Your vision may also be checked. How is this treated? This condition may be treated with lifestyle changes and with medicines that help relieve symptoms. Follow these instructions at home: Managing pain  Take over-the-counter and prescription medicines only  as told by your health care provider.  When you have a headache, lie down in a dark, quiet room.  If directed, apply ice to the head and neck: ? Put ice in a plastic bag. ? Place a towel between your skin and the bag. ? Leave the ice on for 20 minutes, 2-3 times a day.  If directed, apply heat to the back of your neck as often as told by your health care provider. Use the heat source that your health care provider recommends, such as a moist heat pack or a heating pad. ? Place a towel between your skin and the heat source. ? Leave the heat on for 20-30 minutes. ? Remove the heat if your skin turns bright red. This is especially important if you are unable to feel pain, heat, or cold. You may have a greater risk of getting burned. Eating and drinking  Eat meals on a regular schedule.  Limit alcohol intake to no more than 1 drink a day for nonpregnant women and 2 drinks a day for men. One drink equals 12 oz of beer, 5 oz of wine, or 1 oz of hard liquor.  Drink enough fluid to keep your urine pale yellow.  Decrease your caffeine intake, or stop using caffeine. Lifestyle  Get 7-9 hours of sleep each night, or get the amount of sleep recommended by your health care provider.  At bedtime, remove all electronic devices from your room. Electronic devices include computers, phones, and tablets.  Find ways to manage your stress. Some things  that can help relieve stress include: ? Exercise. ? Deep breathing exercises. ? Yoga. ? Listening to music. ? Positive mental imagery.  Try to sit up straight and avoid tensing your muscles.  Do not use any products that contain nicotine or tobacco, such as cigarettes and e-cigarettes. If you need help quitting, ask your health care provider. General instructions   Keep all follow-up visits as told by your health care provider. This is important.  Avoid any headache triggers. Keep a headache journal to help find out what may trigger your  headaches. For example, write down: ? What you eat and drink. ? How much sleep you get. ? Any change to your diet or medicines. Contact a health care provider if:  Your headache does not get better.  Your headache comes back.  You are sensitive to sounds, light, or smells because of a headache.  You have nausea or you vomit.  Your stomach hurts. Get help right away if:  You suddenly develop a very severe headache along with any of the following: ? A stiff neck. ? Nausea and vomiting. ? Confusion. ? Weakness. ? Double vision or loss of vision. ? Shortness of breath. ? Rash. ? Unusual sleepiness. ? Fever. ? Trouble speaking. ? Pain in your eyes or ears. ? Trouble walking or balancing. ? Feeling faint or passing out. Summary  A tension headache is a feeling of pain, pressure, or aching in the head that is often felt over the front and sides of the head.  A tension headache can last from 30 minutes to several days. It is the most common kind of headache.  This condition may be diagnosed based on your symptoms, your medical history, and a physical exam.  This condition may be treated with lifestyle changes and with medicines that help relieve symptoms. This information is not intended to replace advice given to you by your health care provider. Make sure you discuss any questions you have with your health care provider. Document Released: 11/22/2005 Document Revised: 11/04/2017 Document Reviewed: 03/04/2017 Elsevier Patient Education  2020 [employer].   I suspect frontal tension and sinus headaches. You will see Dr Jaynee Eagles, headache specialist the next time in 3 month, and follow NP as well.  ontal sinus headaches suspected.

## 2019-09-25 NOTE — Progress Notes (Signed)
Provider:  Larey Seat, M D  Referring Provider: Binnie Rail, MD Primary Care Physician:  Binnie Rail, MD  Chief Complaint  Patient presents with   Follow-up    pt following up today. headaches daily and at night. taking medication over the counter when she has it. She is not on a daily preventative. memory is stable and ok headaches are main discussion for today pt unsure of medication that she is taking on daily basis.    HPI:   She is seen here today on 09-25-2019 alone upon a referral/ revisit from Dr. Quay Burow for [facility]. Janet Steele is a 85 y.o. female patient who was seen by me in 2016 for MCI and followed by NP in consecutive visits ( 12/ 2017: 04/2017). Today, she denies memory trouble.  Patient unaware of her medications, when confronted with the September list can't recall the items. She is very hard of hearing .  History obtained is poor. Certainly having not just auditory problems but cognitive processing problems as well, can't find words. Uses her rolator and takes very small steps. She talks with a loud voice , is trying to read my lips.    The headaches are daily, 2-3 times, and described as ' awefull", not much nausea but forehead pain- she points to both temples , the midpoint above each brow and the middle of the front-  each lasting 2-5 minutes each time. Burning quality. Sometimes stabbing , sharp but not poking. No associated vertigo, no visual changes. No tearing , flushing, no facial droop.  She tries to sit down if the headaches overwhelm her while walking or standing, stated that she feels like "screaming" . No predictable time of onset. Not woken out of sleep by headaches. Not provoked by chewing, no g fever, not worse when in ed or at rest. Not migrainous. Reports no falls and denies any no trauma to face or head.   If any Medications were recently prescribed, these were not recalled.    Review of Systems: Out of a complete 14 system  review, the patient complains of only the following symptoms, and all other reviewed systems are negative.   headaches , described above.     Social History   Socioeconomic History   Marital status: Married    Spouse name: [name]   Number of children: 3   Years of education: 16   Highest education level: Not on file  Occupational History   Occupation: Retired     Fish farm manager: RETIRED  Scientist, product/process development strain: Not hard at [employer] insecurity    Worry: Never true    Inability: Never true   Transportation needs    Medical: No    Non-medical: No  Tobacco Use   Smoking status: Former Smoker    Packs/day: 0.50    Years: 30.00    Pack years: 15.00    Types: Cigarettes    Quit date: 07/05/1976    Years since quitting: 43.2   Smokeless tobacco: Never Used  Substance and Sexual Activity   Alcohol use: No    Alcohol/week: 3.0 standard drinks    Types: 3 Glasses of wine per week    Frequency: Never    Comment: "not anymore, I haven't in I don't know years"   Drug use: No   Sexual activity: Never  Lifestyle   Physical activity    Days per week: 3 days    Minutes  per session: 20 min   Stress: Not at all  Relationships   Social connections    Talks on phone: More than three times a week    Gets together: Three times a week    Attends religious service: 1 to 4 times per year    Active member of club or organization: No    Attends meetings of clubs or organizations: Never    Relationship status: Married   Intimate partner violence    Fear of current or ex partner: No    Emotionally abused: No    Physically abused: No    Forced sexual activity: No  Other Topics Concern   Not on file  Social History Narrative   Patient is married [name]) and lives at home with her husband.   Patient has three adult children.   Patient is retired.   Patient has a college education.   Patient is right-handed.   Patient does not drink any caffeine.     Family History  Problem Relation Age of Onset   Heart disease Sister    Heart attack Sister    Colon cancer Neg Hx    Breast cancer Neg Hx    Celiac disease Neg Hx    Cirrhosis Neg Hx    Clotting disorder Neg Hx    Colitis Neg Hx    Colon polyps Neg Hx    Crohn's disease Neg Hx    Cystic fibrosis Neg Hx    Diabetes Neg Hx    Esophageal cancer Neg Hx    Hemochromatosis Neg Hx    Inflammatory bowel disease Neg Hx    Irritable bowel syndrome Neg Hx    Kidney disease Neg Hx    Liver cancer Neg Hx    Liver disease Neg Hx    Ovarian cancer Neg Hx    Pancreatic cancer Neg Hx    Prostate cancer Neg Hx    Rectal cancer Neg Hx    Stomach cancer Neg Hx    Ulcerative colitis Neg Hx    Uterine cancer Neg Hx    Wilson's disease Neg Hx     Past Medical History:  Diagnosis Date   Anemia    Anxiety    Arthritis    "fingers" (05/19/2016   CAD (coronary artery disease)    a. s/p CABG 1982 b. redo CABG 2003. c. [place] despite med rx 05/2016: successful but complicated PCI of PDA beyond graft site, c/b localized dissection requiring overlapping stent.   Carotid bruit    a. Duplex 01/2015: patent vessels, 1-39% BICA, f/u PRN recommened.   Colon polyp    adenomatous   Colon, diverticulosis    Degenerative joint disease    Diastolic dysfunction    Esophageal stricture    GERD (gastroesophageal reflux disease)    Hiatal hernia    History of blood transfusion    "related to my bypass"   Hyperlipidemia    Hypertension    Hypothyroidism    Ischemic cardiomyopathy    a. Cath 05/2016: EF 40% with severe inferior wall HK, suspect hibernating myocardium.   Myocardial infarction ([place]) 1977; 1982   Osteoarthrosis, unspecified whether generalized or localized, unspecified site    Presence of permanent cardiac pacemaker    PVD (peripheral vascular disease) ([place])    Type II diabetes mellitus ([place])    Unspecified hearing loss     Past  Surgical History:  Procedure Laterality Date   ABDOMINAL HYSTERECTOMY     APPENDECTOMY  BACK SURGERY     CARDIAC CATHETERIZATION N/A 05/19/2016   Procedure: Left Heart Cath and Cors/Grafts Angiography;  Surgeon: Belva Crome, MD;  Location: [facility];  Service: Cardiovascular;  Laterality: N/A;   CARDIAC CATHETERIZATION N/A 05/19/2016   Procedure: Coronary Stent Intervention;  Surgeon: Belva Crome, MD;  Location: [facility];  Service: Cardiovascular;  Laterality: N/A;   CATARACT EXTRACTION W/ INTRAOCULAR LENS  IMPLANT, BILATERAL Bilateral    CHOLECYSTECTOMY OPEN     COLONOSCOPY     CORONARY ANGIOPLASTY WITH STENT PLACEMENT  05/19/2016   "2 stents?"   CORONARY ARTERY BYPASS GRAFT  1982; 2003   x4(1982),02-2002 CABG X2   ESOPHAGOGASTRODUODENOSCOPY (EGD) WITH ESOPHAGEAL DILATION  "2-3 times"   INSERT / REPLACE / REMOVE PACEMAKER     LEFT HEART CATHETERIZATION WITH CORONARY/GRAFT ANGIOGRAM N/A 03/05/2014   Procedure: LEFT HEART CATHETERIZATION WITH Beatrix Fetters;  Surgeon: Sinclair Grooms, MD;  Location: [facility];  Service: Cardiovascular;  Laterality: N/A;   LUMBAR DISC SURGERY  01-2000   OOPHORECTOMY Bilateral    PERMANENT PACEMAKER INSERTION N/A 07/06/2012   MDT Adapta L implanted by Dr Rayann Heman for symptomatic bradycardia   TONSILLECTOMY  1930s    Current Outpatient Medications  Medication Sig Dispense Refill   acetaminophen (TYLENOL) 500 MG tablet Take 2 tablets (1,000 mg total) by mouth every 6 (six) hours as needed. 30 tablet 0   atorvastatin (LIPITOR) 80 MG tablet TAKE 1 TABLET BY MOUTH EVERY DAY 90 tablet 1   clopidogrel (PLAVIX) 75 MG tablet TAKE 1 TABLET BY MOUTH ONCE DAILY 90 tablet 0   DULoxetine (CYMBALTA) 60 MG capsule TAKE 1 CAPSULE(60 MG) BY MOUTH DAILY 90 capsule 0   JANUVIA 50 MG tablet Take 50 mg by mouth daily.  0   levothyroxine (SYNTHROID) 88 MCG tablet TAKE 1 TABLET(88 MCG) BY MOUTH DAILY 90 tablet 0    losartan (COZAAR) 25 MG tablet TAKE 1 TABLET(25 MG) BY MOUTH DAILY 90 tablet 0   metFORMIN (GLUCOPHAGE) 1000 MG tablet TAKE 1 TABLET BY MOUTH TWICE DAILY WITH MEALS 180 tablet 1   metoprolol tartrate (LOPRESSOR) 25 MG tablet TAKE 1 TABLET BY MOUTH TWICE DAILY 180 tablet 0   nitroGLYCERIN (NITROSTAT) 0.4 MG SL tablet Place 1 tablet (0.4 mg total) under the tongue every 5 (five) minutes as needed. For chest pain 25 tablet 5   pantoprazole (PROTONIX) 40 MG tablet TAKE 1 TABLET BY MOUTH TWICE DAILY 180 tablet 0   QUEtiapine (SEROQUEL) 25 MG tablet TAKE 1 TABLET BY MOUTH AT BEDTIME. START AFTER MIRTAZIPINE IS TAPERED OFF 30 tablet 5   RA ASPIRIN EC 81 MG EC tablet take 1 tablet by mouth once daily . OVERDUE FOR FOLLOW UP. PLEASE CALL AND SCHEDULE [phone]. 15 tablet 0   No current facility-administered medications for this visit.     Allergies as of 09/25/2019 - Review Complete 09/25/2019  Allergen Reaction Noted   Morphine Hives, Itching, and Rash 12/03/2009   Penicillins Itching and Rash 12/10/2010   Prednisone Other (See Comments) 04/05/2013   Sulfonamide derivatives Itching and Rash 12/03/2009   Codeine Rash 12/03/2009   Hydrocodone-acetaminophen Other (See Comments) 01/04/2008   Meperidine hcl Itching 12/03/2009   Metoclopramide hcl Itching and Rash 12/03/2009   Metoprolol succinate Other (See Comments)    Moxifloxacin Itching 12/03/2009   Oxycodone-aspirin Rash 12/03/2009   Pentazocine lactate Nausea Only 12/03/2009   Pioglitazone Other (See Comments) 01/04/2008    Vitals: BP 116/64  Pulse 71    Temp (!) 97.4 F (36.3 C)    Ht 5' (1.524 m)    Wt 111 lb (50.3 kg)    BMI 21.68 kg/m  Last Weight:  Wt Readings from Last 1 Encounters:  09/25/19 111 lb (50.3 kg)   Last Height:   Ht Readings from Last 1 Encounters:  09/25/19 5' (1.524 m)    Physical exam:  General: The patient is awake, alert and appears not in acute distress. The patient is well  groomed. Head: Normocephalic, atraumatic. Neck is supple. Mallampati 2, neck circumference:13"  Left ear drum slightly swollen and a blister at the bottom of the left outer ear canal  Cardiovascular:  Regular rate and rhythm , Respiratory: Lungs are congested, rhonchi. Skin:  Without evidence of edema, or rash Trunk: BMI is 21.  Neurologic exam : The patient is awake and alert . Mood and affect are appropriate.  Cranial nerves: Pupils are equal and briskly reactive to light. Funduscopic exam without  evidence of pallor or edema. Extraocular movements  in vertical and horizontal planes intact and without nystagmus.  Visual fields by finger perimetry are intact. Hearing to finger rub- impaired.  Facial sensation intact to fine touch. Facial motor strength is symmetric and tongue and uvula move midline. Tongue protrusion into either cheek is normal. Shoulder shrug is normal.   Motor exam: Normal tone ,muscle bulk and symmetric strength in all extremities. Sensory:  Fine touch, pinprick were normal. Coordination: Rapid alternating movements in the fingers/hands were normal. Finger-to-nose maneuver  normal without evidence of ataxia, dysmetria or tremor.  Gait and station: Patient walks with walker as  assistive device Stance is stable .Tandem gait is deferred. . Romberg testing is positive . Deep tendon reflexes: in the  upper and lower extremities are symmetric and intact.  Assessment:  After physical and neurologic examination, review of laboratory studies, imaging, neurophysiology testing and pre-existing records, assessment is that of :   Unclear headache type- likely sinus related . Cannot rule out vascular headache but the sudden onset and frequency is not typical .    Plan:  Treatment plan and additional workup :  Lets get a sed rate, c reactive protein and a CT head.      Asencion Partridge Thaxton Pelley MD 09/25/2019  PS: listed here are lab results  from 9-11 with PCP:CD    Ref Range &  Units 42mo ago  2mo ago  27yr ago   Hgb A1c MFr Bld 4.6 - 6.5 % 6.9 6.4 CM  5.8 CM   Comment: Glycemic Control Guidelines for People with Diabetes:Non Diabetic: <6%Goal of Therapy: <7%Additional Action Suggested: >8%   Resulting Agency  [facility]     Specimen Collected: 08/17/19 11:59 Last Resulted: 08/17/19 12:59  Lab Flowsheet  Order Details  View Encounter  Lab and Collection Details  Routing  Result History   CM=Additional comments    Other Results from 08/17/2019 Result Notes for TSH Notes recorded by Anselm Lis, RN on 08/21/2019 at 4:02 PM EDT  Pt husband given lab results per notes of Dr Quay Burow on 08/18/2019 Pt verbalized understanding. He states his wife is skipping a lot of medication. He states this is a problem trying to get her to take them daily.   ------   Notes recorded by Cresenciano Lick, CMA on 08/21/2019 at 2:45 PM EDT  Left message for patient/husband to call back. CRM created.  ------   Notes recorded by Quay Burow,  Claudina Lick, MD on 08/18/2019 at 12:15 PM EDT  Speak with husband regarding results. Thyroid function is underactive and it appears she is not taking her medication. Please confirm that she is taking it before we adjust. Cholesterol is okay, but slightly higher than 7 months ago. Blood counts are normal. Sugars are well controlled. Liver tests are normal. Kidney function is decreased, but stable  TSH Order: [id number]   Status: Final result Visible to patient: Yes (MyChart) Next appt: 02/19/2020 at 01:30 PM in Internal Medicine Binnie Rail, MD) Dx: Rojelio Brenner; Essential hypertensio...    Ref Range & Units 73mo ago  68mo ago  102yr ago   TSH 0.35 - 4.50 uIU/mL 15.81 0.46  0.03  Resulting Agency  [facility]     Specimen Collected: 08/17/19 11:59 Last Resulted: 08/17/19 13:13  Lab Flowsheet  Order Details  View Encounter  Lab and Collection Details  Routing  Result History         Result Notes for Lipid panel Notes recorded by Anselm Lis, RN on 08/21/2019 at 4:02 PM EDT  Pt husband given lab results per notes of Dr Quay Burow on 08/18/2019 Pt verbalized understanding. He states his wife is skipping a lot of medication. He states this is a problem trying to get her to take them daily.   ------   Notes recorded by Cresenciano Lick, CMA on 08/21/2019 at 2:45 PM EDT  Left message for patient/husband to call back. CRM created.  ------   Notes recorded by Binnie Rail, MD on 08/18/2019 at 12:15 PM EDT  Speak with husband regarding results. Thyroid function is underactive and it appears she is not taking her medication. Please confirm that she is taking it before we adjust. Cholesterol is okay, but slightly higher than 7 months ago. Blood counts are normal. Sugars are well controlled. Liver tests are normal. Kidney function is decreased, but stable  Lipid panel Order: [id number]   Status: Final result Visible to patient: Yes (MyChart) Next appt: 02/19/2020 at 01:30 PM in Internal Medicine Binnie Rail, MD) Dx: Rojelio Brenner; Essential hypertensio...    Ref Range & Units 12mo ago  52mo ago  84yr ago   Cholesterol 0 - 200 mg/dL 212 154 CM  112 CM   Comment: ATP III Classification Desirable: < 200 mg/dL Borderline High: 200 - 239 mg/dL High: > = 240 mg/dL  Triglycerides 0.0 - 149.0 mg/dL 192.0 104.0 CM  76.0 CM   Comment: Normal: <150 mg/dLBorderline High: 150 - 199 mg/dL  HDL >39.00 mg/dL 42.00  39.00 mg/dL" class="[id number] hlt1024"37.20 39.00 mg/dL" class="[id number] hlt1024"29.80  VLDL 0.0 - 40.0 mg/dL 38.4  20.8  15.2   LDL Cholesterol 0 - 99 mg/dL 132 96  67   Total CHOL/HDL Ratio  5  4 CM  4 CM   Comment: Men Women1/2 Average Risk 3.4 3.3Average Risk 5.0 4.42X Average Risk 9.6 7.13X Average Risk 15.0 11.0   NonHDL  170.15  116.50 CM  82.40 CM   Comment: NOTE: Non-HDL goal should be 30 mg/dL higher than patient's LDL goal (i.e. LDL goal of < 70 mg/dL, would  have non-HDL goal of < 100 mg/dL)  Resulting Agency  [facility]     Specimen Collected: 08/17/19 11:59 Last Resulted: 08/17/19 13:06  Lab Flowsheet  Order Details  View Encounter  Lab and Collection Details  Routing  Result History   CM=Additional comments       Result  Notes for Comprehensive metabolic panel Notes recorded by Anselm Lis, RN on 08/21/2019 at 4:02 PM EDT  Pt husband given lab results per notes of Dr Quay Burow on 08/18/2019 Pt verbalized understanding. He states his wife is skipping a lot of medication. He states this is a problem trying to get her to take them daily.   ------   Notes recorded by Cresenciano Lick, CMA on 08/21/2019 at 2:45 PM EDT  Left message for patient/husband to call back. CRM created.  ------   Notes recorded by Binnie Rail, MD on 08/18/2019 at 12:15 PM EDT  Speak with husband regarding results. Thyroid function is underactive and it appears she is not taking her medication. Please confirm that she is taking it before we adjust. Cholesterol is okay, but slightly higher than 7 months ago. Blood counts are normal. Sugars are well controlled. Liver tests are normal. Kidney function is decreased, but stable  Comprehensive metabolic panel Order: [id number]   Status: Final result Visible to patient: Yes (MyChart) Next appt: 02/19/2020 at 01:30 PM in Internal Medicine Binnie Rail, MD) Dx: Rojelio Brenner; Essential hypertensio...    Ref Range & Units 13mo ago  51mo ago  40yr ago   Sodium 135 - 145 mEq/L 139  139  138   Potassium 3.5 - 5.1 mEq/L 5.0  4.8  4.2   Chloride 96 - 112 mEq/L 104  105  107   CO2 19 - 32 mEq/L 27  26  25    Glucose, Bld 70 - 99 mg/dL 135 145 109  BUN 6 - 23 mg/dL 16  26 14    Creatinine, Ser 0.40 - 1.20 mg/dL 1.06  1.16  0.90   Total Bilirubin 0.2 - 1.2 mg/dL 1.1  1.2  1.1   Alkaline Phosphatase 39 - 117 U/L 83  72  64   AST 0 - 37 U/L 12  14  11    ALT 0 - 35 U/L 8  12  9    Total  Protein 6.0 - 8.3 g/dL 7.3  6.6  6.2   Albumin 3.5 - 5.2 g/dL 4.4  4.2  3.6   Calcium 8.4 - 10.5 mg/dL 9.8  9.3  9.0   GFR >60.00 mL/min 48.94 60.00 mL/min" class="rz_96_8 hlt1024"44.17 60.00 mL/min" class="rz_96_8 hlt1024"62.98   Resulting Agency  [facility]     Specimen Collected: 08/17/19 11:59 Last Resulted: 08/17/19 13:06  Lab Flowsheet  Order Details  View Encounter  Lab and Collection Details  Routing  Result History        Result Notes for CBC with Differential/Platelet Notes recorded by Anselm Lis, RN on 08/21/2019 at 4:02 PM EDT  Pt husband given lab results per notes of Dr Quay Burow on 08/18/2019 Pt verbalized understanding. He states his wife is skipping a lot of medication. He states this is a problem trying to get her to take them daily.   ------   Notes recorded by Cresenciano Lick, CMA on 08/21/2019 at 2:45 PM EDT  Left message for patient/husband to call back. CRM created.  ------   Notes recorded by Binnie Rail, MD on 08/18/2019 at 12:15 PM EDT  Speak with husband regarding results. Thyroid function is underactive and it appears she is not taking her medication. Please confirm that she is taking it before we adjust. Cholesterol is okay, but slightly higher than 7 months ago. Blood counts are normal. Sugars are well controlled. Liver tests are normal. Kidney function is decreased,  but stable  CBC with Differential/Platelet Order: [id number]   Status: Final result Visible to patient: Yes (MyChart) Next appt: 02/19/2020 at 01:30 PM in Internal Medicine Binnie Rail, MD) Dx: Rojelio Brenner; Essential hypertensio...    Ref Range & Units 35mo ago  30mo ago  73yr ago   WBC 4.0 - 10.5 K/uL 8.4  7.0  5.4   RBC 3.87 - 5.11 Mil/uL 4.25  4.13  3.81  Hemoglobin 12.0 - 15.0 g/dL 11.9 11.8 10.9  HCT 36.0 - 46.0 % 36.1  36.0  32.4  MCV 78.0 - 100.0 fl 84.9  87.3  85.1   MCHC 30.0 - 36.0 g/dL 33.1  32.8  33.8   RDW 11.5 - 15.5 % 14.5   14.3  14.8   Platelets 150.0 - 400.0 K/uL 294.0  283.0  258.0   Neutrophils Relative % 43.0 - 77.0 % 72.3  66.9  65.9   Lymphocytes Relative 12.0 - 46.0 % 19.7  22.7  22.6   Monocytes Relative 3.0 - 12.0 % 5.6  6.5  7.0   Eosinophils Relative 0.0 - 5.0 % 1.6  3.0  3.9   Basophils Relative 0.0 - 3.0 % 0.8  0.9  0.6   Neutro Abs 1.4 - 7.7 K/uL 6.1  4.7  3.6   Lymphs Abs 0.7 - 4.0 K/uL 1.7  1.6  1.2   Monocytes Absolute 0.1 - 1.0 K/uL 0.5  0.5  0.4   Eosinophils Absolute 0.0 - 0.7 K/uL 0.1  0.2  0.2   Basophils Absolute 0.0 - 0.1 K/uL 0.1  0.1  0.0   Resulting Agency  [facility]     Specimen Collected: 08/17/19 11:59 Last Resulted: 08/17/19 12:21  Lab Flowsheet  Order Details  View Encounter  Lab and Collection Details  Routing  Result History        Result Notes for Hemoglobin A1c Notes recorded by Anselm Lis, RN on 08/21/2019 at 4:02 PM EDT  Pt husband given lab results per notes of Dr Quay Burow on 08/18/2019 Pt verbalized understanding. He states his wife is skipping a lot of medication. He states this is a problem trying to get her to take them daily.   ------   Notes recorded by Cresenciano Lick, CMA on 08/21/2019 at 2:45 PM EDT  Left message for patient/husband to call back. CRM created.  ------   Notes recorded by Binnie Rail, MD on 08/18/2019 at 12:15 PM EDT  Speak with husband regarding results. Thyroid function is underactive and it appears she is not taking her medication. Please confirm that she is taking it before we adjust. Cholesterol is okay, but slightly higher than 7 months ago. Blood counts are normal. Sugars are well controlled. Liver tests are normal. Kidney function is decreased, but stable

## 2019-09-26 ENCOUNTER — Telehealth: Payer: Self-pay | Admitting: Neurology

## 2019-09-26 LAB — SEDIMENTATION RATE: Sed Rate: 2 mm/hr (ref 0–40)

## 2019-09-26 LAB — C-REACTIVE PROTEIN: CRP: <1 mg/L (ref 0–10)

## 2019-09-26 NOTE — Telephone Encounter (Signed)
Called the pt and reviewed lab results with her. Advised they were in normal range and that she would need to be on the lookout for them to scheudle the CT scan. Pt verbalized understanding. Pt had no questions at this time but was encouraged to call back if questions arise.

## 2019-09-26 NOTE — Telephone Encounter (Signed)
-----   Message from Larey Seat, MD sent at 09/26/2019 11:58 AM EDT ----- Non elevated Sed Rate , no CRP- not an arteritis form. Lets concentrate on tension and sinus headaches.

## 2019-09-26 NOTE — Telephone Encounter (Signed)
Medicare/bcbs supp order sent to GI. No auth they will reach out to the patient to schedule.  

## 2019-10-10 ENCOUNTER — Other Ambulatory Visit: Payer: Self-pay | Admitting: Internal Medicine

## 2019-10-10 DIAGNOSIS — I25700 Atherosclerosis of coronary artery bypass graft(s), unspecified, with unstable angina pectoris: Secondary | ICD-10-CM

## 2019-10-10 DIAGNOSIS — I200 Unstable angina: Secondary | ICD-10-CM

## 2019-10-19 ENCOUNTER — Telehealth: Payer: Self-pay | Admitting: Internal Medicine

## 2019-10-19 NOTE — Telephone Encounter (Signed)
Copied from [place] [phone]. Topic: General - Other >> Oct 19, 2019 12:01 PM Keene Breath wrote: Reason for CRM: Patient's husband called to see if the doctor can give the patient some medication for her headaches until her neurologist appt. In January.  Please advise and call to discuss at [phone]

## 2019-10-20 NOTE — Telephone Encounter (Signed)
We could try gabapentin - it may or may not help.  It can cause drowsiness.  She has taken this in the past.  Let me know

## 2019-10-22 MED ORDER — GABAPENTIN 100 MG PO CAPS: 100.0000 mg | ORAL_CAPSULE | Freq: Two times a day (BID) | ORAL | 5 refills | Status: DC

## 2019-10-22 NOTE — Telephone Encounter (Signed)
Sent to pof 

## 2019-10-22 NOTE — Telephone Encounter (Signed)
Will do gabapentin

## 2019-10-22 NOTE — Telephone Encounter (Signed)
LVM letting pt know.  

## 2019-10-26 ENCOUNTER — Ambulatory Visit
Admission: RE | Admit: 2019-10-26 | Discharge: 2019-10-26 | Disposition: A | Discharge status: Home / Self Care | Payer: Medicare Other | Source: Ambulatory Visit | Attending: Neurology | Admitting: Neurology

## 2019-10-26 ENCOUNTER — Other Ambulatory Visit: Payer: Self-pay

## 2019-10-26 DIAGNOSIS — G44031 Episodic paroxysmal hemicrania, intractable: Secondary | ICD-10-CM

## 2019-10-29 ENCOUNTER — Encounter: Payer: Self-pay | Admitting: Neurology

## 2019-10-29 ENCOUNTER — Telehealth: Payer: Self-pay | Admitting: Neurology

## 2019-10-29 NOTE — Telephone Encounter (Signed)
-----   Message from Larey Seat, MD sent at 10/29/2019  9:34 AM EST ----- IMPRESSION:  unchanged findings. 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  Overall no significant  change compared with previous CT head from September 2019.

## 2019-10-29 NOTE — Telephone Encounter (Signed)
Called the patient to review the CT head results. There was no answer. LVM informing the pt to call back or to check mychart message as I will send the result there as well.   **IF pt calls back please advise the Ct of the head that was completed was unchanged from September 2019. Nothing new on the images.

## 2019-10-31 NOTE — Telephone Encounter (Signed)
Spoke with patient and discussed CT head results unchanged from previous CT head 08/2018. Pt verbalized understanding. Her questions were answered. She is aware of the appt with Dr. Jaynee Eagles on 12/26/2019 @ 3 pm. She asked for a sooner appt if possible. Pt placed on wait list d/t nothing sooner open at this time. Pt verbalized appreciation.

## 2019-11-09 ENCOUNTER — Other Ambulatory Visit: Payer: Self-pay | Admitting: Internal Medicine

## 2019-11-16 DIAGNOSIS — H35033 Hypertensive retinopathy, bilateral: Secondary | ICD-10-CM | POA: Diagnosis not present

## 2019-11-16 DIAGNOSIS — H353122 Nonexudative age-related macular degeneration, left eye, intermediate dry stage: Secondary | ICD-10-CM | POA: Diagnosis not present

## 2019-11-16 DIAGNOSIS — H353211 Exudative age-related macular degeneration, right eye, with active choroidal neovascularization: Secondary | ICD-10-CM | POA: Diagnosis not present

## 2019-11-16 DIAGNOSIS — H43813 Vitreous degeneration, bilateral: Secondary | ICD-10-CM | POA: Diagnosis not present

## 2019-11-19 ENCOUNTER — Telehealth: Payer: Self-pay | Admitting: Cardiovascular Disease

## 2019-11-19 NOTE — Telephone Encounter (Signed)
Patient had a stroke two years ago, and requires assistance walking, her husband states and he goes with her everywhere.

## 2019-11-19 NOTE — Telephone Encounter (Signed)
Informed patient's husband he may come up with her. Note placed for check-in to allow visitor.

## 2019-11-22 ENCOUNTER — Ambulatory Visit (INDEPENDENT_AMBULATORY_CARE_PROVIDER_SITE_OTHER): Payer: Medicare Other | Admitting: Cardiovascular Disease

## 2019-11-22 ENCOUNTER — Encounter: Payer: Self-pay | Admitting: Cardiovascular Disease

## 2019-11-22 VITALS — BP 110/54 | HR 82 | Resp 97 | Ht 60.0 in | Wt 114.5 lb

## 2019-11-22 DIAGNOSIS — I25119 Atherosclerotic heart disease of native coronary artery with unspecified angina pectoris: Secondary | ICD-10-CM | POA: Diagnosis not present

## 2019-11-22 DIAGNOSIS — I200 Unstable angina: Secondary | ICD-10-CM | POA: Diagnosis not present

## 2019-11-22 DIAGNOSIS — E782 Mixed hyperlipidemia: Secondary | ICD-10-CM | POA: Diagnosis not present

## 2019-11-22 DIAGNOSIS — I10 Essential (primary) hypertension: Secondary | ICD-10-CM

## 2019-11-22 NOTE — Patient Instructions (Signed)

## 2019-11-22 NOTE — Progress Notes (Signed)
Cardiology Office Note:    Date:  11/22/2019   ID:  Janet Steele, DOB 09/09/1929, MRN [id number]  PCP:  Binnie Rail, MD  Cardiologist:  Sherren Mocha, MD  Electrophysiologist:  None   Referring MD: Binnie Rail, MD   Chief Complaint  Patient presents with  . Coronary Artery Disease    History of Present Illness:    Janet Steele is a 85 y.o. female with a hx of coronary artery disease, presenting for follow-up evaluation.  The patient underwent initial CABG in 1992 and redo CABG in 2003.  Over the years she has had chronic angina, hypertension, mixed hyperlipidemia, type 2 diabetes, and symptomatic bradycardia status post permanent pacemaker placement.  The patient is here alone today.  She ambulates with a walker and is moving slowly.  She states that she is taking her medicines regularly but that is a little bit unclear and there is some documentation that she is skipping her medicines frequently.  She seems to be feeling fine.  She denies chest pain or shortness of breath.  She is had no heart palpitations, leg swelling, lightheadedness, or syncope.  Past Medical History:  Diagnosis Date  . Anemia   . Anxiety   . Arthritis    "fingers" (05/19/2016  . CAD (coronary artery disease)    a. s/p CABG 1982 b. redo CABG 2003. c. [place] despite med rx 05/2016: successful but complicated PCI of PDA beyond graft site, c/b localized dissection requiring overlapping stent.  . Carotid bruit    a. Duplex 01/2015: patent vessels, 1-39% BICA, f/u PRN recommened.  . Colon polyp    adenomatous  . Colon, diverticulosis   . Degenerative joint disease   . Diastolic dysfunction   . Esophageal stricture   . GERD (gastroesophageal reflux disease)   . Hiatal hernia   . History of blood transfusion    "related to my bypass"  . Hyperlipidemia   . Hypertension   . Hypothyroidism   . Ischemic cardiomyopathy    a. Cath 05/2016: EF 40% with severe inferior wall HK, suspect hibernating myocardium.   . Myocardial infarction ([place]) 1977; 1982  . Osteoarthrosis, unspecified whether generalized or localized, unspecified site   . Presence of permanent cardiac pacemaker   . PVD (peripheral vascular disease) ([place])   . Type II diabetes mellitus ([place])   . Unspecified hearing loss     Past Surgical History:  Procedure Laterality Date  . ABDOMINAL HYSTERECTOMY    . APPENDECTOMY    . BACK SURGERY    . CARDIAC CATHETERIZATION N/A 05/19/2016   Procedure: Left Heart Cath and Cors/Grafts Angiography;  Surgeon: Belva Crome, MD;  Location: [place] [facility];  Service: Cardiovascular;  Laterality: N/A;  . CARDIAC CATHETERIZATION N/A 05/19/2016   Procedure: Coronary Stent Intervention;  Surgeon: Belva Crome, MD;  Location: [facility];  Service: Cardiovascular;  Laterality: N/A;  . CATARACT EXTRACTION W/ INTRAOCULAR LENS  IMPLANT, BILATERAL Bilateral   . CHOLECYSTECTOMY OPEN    . COLONOSCOPY    . CORONARY ANGIOPLASTY WITH STENT PLACEMENT  05/19/2016   "2 stents?"  . CORONARY ARTERY BYPASS GRAFT  1982; 2003   x4(1982),02-2002 CABG X2  . ESOPHAGOGASTRODUODENOSCOPY (EGD) WITH ESOPHAGEAL DILATION  "2-3 times"  . INSERT / REPLACE / REMOVE PACEMAKER    . LEFT HEART CATHETERIZATION WITH CORONARY/GRAFT ANGIOGRAM N/A 03/05/2014   Procedure: LEFT HEART CATHETERIZATION WITH Beatrix Fetters;  Surgeon: Sinclair Grooms, MD;  Location: [facility]  LAB;  Service: Cardiovascular;  Laterality: N/A;  . LUMBAR [place] SURGERY  01-2000  . OOPHORECTOMY Bilateral   . PERMANENT PACEMAKER INSERTION N/A 07/06/2012   MDT Adapta L implanted by Dr Rayann Heman for symptomatic bradycardia  . TONSILLECTOMY  1930s    Current Medications: Current Meds  Medication Sig  . acetaminophen (TYLENOL) 500 MG tablet Take 2 tablets (1,000 mg total) by mouth every 6 (six) hours as needed.  Marland Kitchen atorvastatin (LIPITOR) 80 MG tablet TAKE 1 TABLET BY MOUTH EVERY DAY  . clopidogrel (PLAVIX) 75 MG tablet TAKE 1 TABLET BY MOUTH EVERY DAY  .  DULoxetine (CYMBALTA) 60 MG capsule TAKE 1 CAPSULE(60 MG) BY MOUTH DAILY  . gabapentin (NEURONTIN) 100 MG capsule Take 1 capsule (100 mg total) by mouth 2 (two) times daily.  Marland Kitchen guaiFENesin (MUCINEX) 600 MG 12 hr tablet Take 1 tablet (600 mg total) by mouth 2 (two) times daily. Take with lots of liquid, hot liquids.  Marland Kitchen JANUVIA 50 MG tablet Take 50 mg by mouth daily.  Marland Kitchen levothyroxine (SYNTHROID) 88 MCG tablet TAKE 1 TABLET(88 MCG) BY MOUTH DAILY  . losartan (COZAAR) 25 MG tablet TAKE 1 TABLET(25 MG) BY MOUTH DAILY  . metFORMIN (GLUCOPHAGE) 1000 MG tablet TAKE 1 TABLET BY MOUTH TWICE DAILY WITH MEALS  . metoprolol tartrate (LOPRESSOR) 25 MG tablet TAKE 1 TABLET BY MOUTH TWICE DAILY  . nitroGLYCERIN (NITROSTAT) 0.4 MG SL tablet Place 1 tablet (0.4 mg total) under the tongue every 5 (five) minutes as needed. For chest pain  . pantoprazole (PROTONIX) 40 MG tablet TAKE 1 TABLET BY MOUTH TWICE DAILY  . QUEtiapine (SEROQUEL) 25 MG tablet TAKE 1 TABLET BY MOUTH AT BEDTIME. START AFTER MIRTAZIPINE IS TAPERED OFF  . [DISCONTINUED] RA ASPIRIN EC 81 MG EC tablet take 1 tablet by mouth once daily . OVERDUE FOR FOLLOW UP. PLEASE CALL AND SCHEDULE [phone].     Allergies:   Morphine, Penicillins, Prednisone, Sulfonamide derivatives, Codeine, Hydrocodone-acetaminophen, Meperidine hcl, Metoclopramide hcl, Metoprolol succinate, Moxifloxacin, Oxycodone-aspirin, Pentazocine lactate, and Pioglitazone   Social History   Socioeconomic History  . Marital status: Married    Spouse name: [name]  . Number of children: 3  . Years of education: 15  . Highest education level: Not on file  Occupational History  . Occupation: Retired     Fish farm manager: RETIRED  Tobacco Use  . Smoking status: Former Smoker    Packs/day: 0.50    Years: 30.00    Pack years: 15.00    Types: Cigarettes    Quit date: 07/05/1976    Years since quitting: 43.4  . Smokeless tobacco: Never Used  Substance and Sexual Activity  . Alcohol use:  No    Alcohol/week: 3.0 standard drinks    Types: 3 Glasses of wine per week    Comment: "not anymore, I haven't in I don't know years"  . Drug use: No  . Sexual activity: Never  Other Topics Concern  . Not on file  Social History Narrative   Patient is married [name]) and lives at home with her husband.   Patient has three adult children.   Patient is retired.   Patient has a college education.   Patient is right-handed.   Patient does not drink any caffeine.   Social Determinants of Health   Financial Resource Strain: Low Risk   . Difficulty of Paying Living Expenses: Not hard at all  Food Insecurity: No Food Insecurity  . Worried About Charity fundraiser in the Last  Year: Never true  . Ran Out of Food in the Last Year: Never true  Transportation Needs: No Transportation Needs  . Lack of Transportation (Medical): No  . Lack of Transportation (Non-Medical): No  Physical Activity: Insufficiently Active  . Days of Exercise per Week: 3 days  . Minutes of Exercise per Session: 20 min  Stress: No Stress Concern Present  . Feeling of Stress : Not at all  Social Connections: Slightly Isolated  . Frequency of Communication with Friends and Family: More than three times a week  . Frequency of Social Gatherings with Friends and Family: Three times a week  . Attends Religious Services: 1 to 4 times per year  . Active Member of Clubs or Organizations: No  . Attends Archivist Meetings: Never  . Marital Status: Married     Family History: The patient's family history includes Heart attack in her sister; Heart disease in her sister. There is no history of Colon cancer, Breast cancer, Celiac disease, Cirrhosis, Clotting disorder, Colitis, Colon polyps, Crohn's disease, Cystic fibrosis, Diabetes, Esophageal cancer, Hemochromatosis, Inflammatory bowel disease, Irritable bowel syndrome, Kidney disease, Liver cancer, Liver disease, Ovarian cancer, Pancreatic cancer, Prostate  cancer, Rectal cancer, Stomach cancer, Ulcerative colitis, Uterine cancer, or Wilson's disease.  ROS:   Please see the history of present illness.    All other systems reviewed and are negative.  EKGs/Labs/Other Studies Reviewed:    EKG:  EKG is ordered today.  Atrial paced rhythm 82 bpm, possible age-indeterminate inferior MI, nonspecific T wave abnormality.  Recent Labs: 08/17/2019: ALT 8; BUN 16; Creatinine, Ser 1.06; Hemoglobin 11.9; Platelets 294.0; Potassium 5.0; Sodium 139; TSH 15.81  Recent Lipid Panel    Component Value Date/Time   CHOL 212 (H) 08/17/2019 1159   TRIG 192.0 (H) 08/17/2019 1159   HDL 42.00 08/17/2019 1159   CHOLHDL 5 08/17/2019 1159   VLDL 38.4 08/17/2019 1159   LDLCALC 132 (H) 08/17/2019 1159   LDLDIRECT 254.0 11/01/2017 1702    Physical Exam:    VS:  BP (!) 110/54   Pulse 82   Resp (!) 97   Ht 5' (1.524 m)   Wt 114 lb 8.6 oz (52 kg)   BMI 22.37 kg/m     Wt Readings from Last 3 Encounters:  11/22/19 114 lb 8.6 oz (52 kg)  09/25/19 111 lb (50.3 kg)  08/17/19 110 lb (49.9 kg)     GEN: Pleasant elderly woman in no acute distress HEENT: Normal NECK: No JVD; No carotid bruits LYMPHATICS: No lymphadenopathy CARDIAC: RRR, no murmurs, rubs, gallops RESPIRATORY:  Clear to auscultation without rales, wheezing or rhonchi  ABDOMEN: Soft, non-tender, non-distended MUSCULOSKELETAL:  No edema; No deformity  SKIN: Warm and dry NEUROLOGIC:  Alert and oriented x 3 PSYCHIATRIC:  Normal affect   ASSESSMENT:    1. Coronary artery disease involving native coronary artery of native heart with angina pectoris ([place])   2. Mixed hyperlipidemia   3. Essential hypertension    PLAN:    In order of problems listed above:  1. The patient is stable and continues on antiplatelet therapy with clopidogrel.  She is on a high intensity statin drug.  We reviewed the importance of medication adherence. 2. Treated with high intensity statin drug.  Most recent labs  reviewed.  Stressed the importance of medication adherence. 3. Blood pressure is well controlled on current medical therapy.  Overall the patient appears clinically stable.  She is much more frail over the past few years  since having a stroke.  I did not make any medication changes today and I will plan to see her back in 1 year.   Medication Adjustments/Labs and Tests Ordered: Current medicines are reviewed at length with the patient today.  Concerns regarding medicines are outlined above.  Orders Placed This Encounter  Procedures  . EKG 12-Lead   No orders of the defined types were placed in this encounter.   Patient Instructions  Medication Instructions:  Your provider recommends that you continue on your current medications as directed. Please refer to the Current Medication list given to you today.   *If you need a refill on your cardiac medications before your next appointment, please call your pharmacy*  Follow-Up: At [facility], you and your health needs are our priority.  As part of our continuing mission to provide you with exceptional heart care, we have created designated Provider Care Teams.  These Care Teams include your primary Cardiologist (physician) and Advanced Practice Providers (APPs -  Physician Assistants and Nurse Practitioners) who all work together to provide you with the care you need, when you need it. Your next appointment:   12 month(s) The format for your next appointment:   In Person Provider:   You may see Sherren Mocha, MD or one of the following Advanced Practice Providers on your designated Care Team:    Richardson Dopp, PA-C  Robbie Lis, [place]    Signed, Sherren Mocha, MD  11/22/2019 2:50 PM    [place]

## 2019-12-26 ENCOUNTER — Ambulatory Visit (INDEPENDENT_AMBULATORY_CARE_PROVIDER_SITE_OTHER): Payer: Medicare Other | Admitting: Neurology

## 2019-12-26 ENCOUNTER — Other Ambulatory Visit: Payer: Self-pay

## 2019-12-26 DIAGNOSIS — G44031 Episodic paroxysmal hemicrania, intractable: Secondary | ICD-10-CM

## 2020-01-01 NOTE — Progress Notes (Signed)
Patient left, rescheduled

## 2020-01-04 ENCOUNTER — Ambulatory Visit: Payer: Medicare Other

## 2020-01-10 ENCOUNTER — Ambulatory Visit: Payer: Medicare Other | Attending: Internal Medicine

## 2020-01-10 DIAGNOSIS — Z23 Encounter for immunization: Secondary | ICD-10-CM

## 2020-01-10 NOTE — Progress Notes (Signed)
   Covid-19 Vaccination Clinic  Name:  WHITNEY HILLEGASS    MRN: [id number] DOB: March 16, 1929  01/10/2020  Ms. Ulloa was observed post Covid-19 immunization for 15 minutes without incidence. She was provided with Vaccine Information Sheet and instruction to access the V-Safe system.   Ms. Spitzley was instructed to call 911 with any severe reactions post vaccine: Marland Kitchen Difficulty breathing  . Swelling of your face and throat  . A fast heartbeat  . A bad rash all over your body  . Dizziness and weakness    Immunizations Administered    Name Date Dose VIS Date Route   Pfizer COVID-19 Vaccine 01/10/2020  4:52 PM 0.3 mL 11/16/2019 Intramuscular   Manufacturer: [place]   Lot: [id number]   [place]: [id number]

## 2020-01-11 ENCOUNTER — Ambulatory Visit: Payer: Medicare Other | Admitting: Cardiovascular Disease

## 2020-01-15 ENCOUNTER — Emergency Department (HOSPITAL_COMMUNITY): Payer: Medicare Other

## 2020-01-15 ENCOUNTER — Other Ambulatory Visit: Payer: Self-pay

## 2020-01-15 ENCOUNTER — Inpatient Hospital Stay (HOSPITAL_COMMUNITY)
Admission: EM | Admit: 2020-01-15 | Discharge: 2020-01-19 | DRG: 683 | Disposition: A | Discharge status: Skilled Nursing Facility | Payer: Medicare Other | Attending: Internal Medicine | Admitting: Internal Medicine

## 2020-01-15 ENCOUNTER — Telehealth: Payer: Self-pay | Admitting: Internal Medicine

## 2020-01-15 DIAGNOSIS — I10 Essential (primary) hypertension: Secondary | ICD-10-CM | POA: Diagnosis not present

## 2020-01-15 DIAGNOSIS — F0390 Unspecified dementia without behavioral disturbance: Secondary | ICD-10-CM | POA: Diagnosis present

## 2020-01-15 DIAGNOSIS — Z955 Presence of coronary angioplasty implant and graft: Secondary | ICD-10-CM

## 2020-01-15 DIAGNOSIS — K219 Gastro-esophageal reflux disease without esophagitis: Secondary | ICD-10-CM | POA: Diagnosis not present

## 2020-01-15 DIAGNOSIS — E875 Hyperkalemia: Secondary | ICD-10-CM | POA: Diagnosis present

## 2020-01-15 DIAGNOSIS — H9190 Unspecified hearing loss, unspecified ear: Secondary | ICD-10-CM | POA: Diagnosis present

## 2020-01-15 DIAGNOSIS — E1165 Type 2 diabetes mellitus with hyperglycemia: Secondary | ICD-10-CM

## 2020-01-15 DIAGNOSIS — F419 Anxiety disorder, unspecified: Secondary | ICD-10-CM | POA: Diagnosis present

## 2020-01-15 DIAGNOSIS — Z961 Presence of intraocular lens: Secondary | ICD-10-CM | POA: Diagnosis present

## 2020-01-15 DIAGNOSIS — F05 Delirium due to known physiological condition: Secondary | ICD-10-CM | POA: Diagnosis present

## 2020-01-15 DIAGNOSIS — E1151 Type 2 diabetes mellitus with diabetic peripheral angiopathy without gangrene: Secondary | ICD-10-CM | POA: Diagnosis present

## 2020-01-15 DIAGNOSIS — E039 Hypothyroidism, unspecified: Secondary | ICD-10-CM | POA: Diagnosis not present

## 2020-01-15 DIAGNOSIS — D649 Anemia, unspecified: Secondary | ICD-10-CM | POA: Diagnosis not present

## 2020-01-15 DIAGNOSIS — Z9841 Cataract extraction status, right eye: Secondary | ICD-10-CM

## 2020-01-15 DIAGNOSIS — I25810 Atherosclerosis of coronary artery bypass graft(s) without angina pectoris: Secondary | ICD-10-CM | POA: Diagnosis present

## 2020-01-15 DIAGNOSIS — M1990 Unspecified osteoarthritis, unspecified site: Secondary | ICD-10-CM | POA: Diagnosis present

## 2020-01-15 DIAGNOSIS — E1142 Type 2 diabetes mellitus with diabetic polyneuropathy: Secondary | ICD-10-CM | POA: Diagnosis present

## 2020-01-15 DIAGNOSIS — I959 Hypotension, unspecified: Secondary | ICD-10-CM | POA: Diagnosis not present

## 2020-01-15 DIAGNOSIS — E860 Dehydration: Secondary | ICD-10-CM | POA: Diagnosis present

## 2020-01-15 DIAGNOSIS — K529 Noninfective gastroenteritis and colitis, unspecified: Secondary | ICD-10-CM | POA: Diagnosis present

## 2020-01-15 DIAGNOSIS — Z885 Allergy status to narcotic agent status: Secondary | ICD-10-CM

## 2020-01-15 DIAGNOSIS — I5032 Chronic diastolic (congestive) heart failure: Secondary | ICD-10-CM | POA: Diagnosis not present

## 2020-01-15 DIAGNOSIS — N1831 Chronic kidney disease, stage 3a: Secondary | ICD-10-CM | POA: Diagnosis present

## 2020-01-15 DIAGNOSIS — R531 Weakness: Secondary | ICD-10-CM | POA: Diagnosis not present

## 2020-01-15 DIAGNOSIS — I69320 Aphasia following cerebral infarction: Secondary | ICD-10-CM

## 2020-01-15 DIAGNOSIS — Z888 Allergy status to other drugs, medicaments and biological substances status: Secondary | ICD-10-CM

## 2020-01-15 DIAGNOSIS — Z9842 Cataract extraction status, left eye: Secondary | ICD-10-CM

## 2020-01-15 DIAGNOSIS — I6930 Unspecified sequelae of cerebral infarction: Secondary | ICD-10-CM | POA: Diagnosis not present

## 2020-01-15 DIAGNOSIS — E785 Hyperlipidemia, unspecified: Secondary | ICD-10-CM | POA: Diagnosis present

## 2020-01-15 DIAGNOSIS — I255 Ischemic cardiomyopathy: Secondary | ICD-10-CM | POA: Diagnosis present

## 2020-01-15 DIAGNOSIS — I130 Hypertensive heart and chronic kidney disease with heart failure and stage 1 through stage 4 chronic kidney disease, or unspecified chronic kidney disease: Secondary | ICD-10-CM | POA: Diagnosis present

## 2020-01-15 DIAGNOSIS — Z7984 Long term (current) use of oral hypoglycemic drugs: Secondary | ICD-10-CM

## 2020-01-15 DIAGNOSIS — I2510 Atherosclerotic heart disease of native coronary artery without angina pectoris: Secondary | ICD-10-CM | POA: Diagnosis present

## 2020-01-15 DIAGNOSIS — Z8249 Family history of ischemic heart disease and other diseases of the circulatory system: Secondary | ICD-10-CM

## 2020-01-15 DIAGNOSIS — E782 Mixed hyperlipidemia: Secondary | ICD-10-CM

## 2020-01-15 DIAGNOSIS — Z20822 Contact with and (suspected) exposure to covid-19: Secondary | ICD-10-CM | POA: Diagnosis present

## 2020-01-15 DIAGNOSIS — K5730 Diverticulosis of large intestine without perforation or abscess without bleeding: Secondary | ICD-10-CM | POA: Diagnosis not present

## 2020-01-15 DIAGNOSIS — R197 Diarrhea, unspecified: Secondary | ICD-10-CM | POA: Diagnosis not present

## 2020-01-15 DIAGNOSIS — E1122 Type 2 diabetes mellitus with diabetic chronic kidney disease: Secondary | ICD-10-CM | POA: Diagnosis present

## 2020-01-15 DIAGNOSIS — Z950 Presence of cardiac pacemaker: Secondary | ICD-10-CM | POA: Diagnosis present

## 2020-01-15 DIAGNOSIS — R404 Transient alteration of awareness: Secondary | ICD-10-CM | POA: Diagnosis not present

## 2020-01-15 DIAGNOSIS — R4182 Altered mental status, unspecified: Secondary | ICD-10-CM | POA: Diagnosis not present

## 2020-01-15 DIAGNOSIS — N179 Acute kidney failure, unspecified: Secondary | ICD-10-CM | POA: Diagnosis not present

## 2020-01-15 DIAGNOSIS — E119 Type 2 diabetes mellitus without complications: Secondary | ICD-10-CM

## 2020-01-15 DIAGNOSIS — Z79899 Other long term (current) drug therapy: Secondary | ICD-10-CM

## 2020-01-15 DIAGNOSIS — Z79890 Hormone replacement therapy (postmenopausal): Secondary | ICD-10-CM

## 2020-01-15 DIAGNOSIS — Z7902 Long term (current) use of antithrombotics/antiplatelets: Secondary | ICD-10-CM

## 2020-01-15 DIAGNOSIS — Z882 Allergy status to sulfonamides status: Secondary | ICD-10-CM

## 2020-01-15 DIAGNOSIS — Z880 Allergy status to penicillin: Secondary | ICD-10-CM

## 2020-01-15 DIAGNOSIS — Z87891 Personal history of nicotine dependence: Secondary | ICD-10-CM

## 2020-01-15 DIAGNOSIS — I252 Old myocardial infarction: Secondary | ICD-10-CM

## 2020-01-15 DIAGNOSIS — D631 Anemia in chronic kidney disease: Secondary | ICD-10-CM | POA: Diagnosis present

## 2020-01-15 DIAGNOSIS — F329 Major depressive disorder, single episode, unspecified: Secondary | ICD-10-CM | POA: Diagnosis present

## 2020-01-15 DIAGNOSIS — R402411 Glasgow coma scale score 13-15, in the field [EMT or ambulance]: Secondary | ICD-10-CM | POA: Diagnosis not present

## 2020-01-15 LAB — CBC WITH DIFFERENTIAL/PLATELET
Abs Immature Granulocytes: 0.07 10*3/uL (ref 0.00–0.07)
Basophils Absolute: 0 10*3/uL (ref 0.0–0.1)
Basophils Relative: 0 %
Eosinophils Absolute: 0 10*3/uL (ref 0.0–0.5)
Eosinophils Relative: 0 %
HCT: 34.4 % — ABNORMAL LOW (ref 36.0–46.0)
Hemoglobin: 10.9 g/dL — ABNORMAL LOW (ref 12.0–15.0)
Immature Granulocytes: 1 %
Lymphocytes Relative: 9 %
Lymphs Abs: 1.2 10*3/uL (ref 0.7–4.0)
MCH: 27.4 pg (ref 26.0–34.0)
MCHC: 31.7 g/dL (ref 30.0–36.0)
MCV: 86.4 fL (ref 80.0–100.0)
Monocytes Absolute: 0.6 10*3/uL (ref 0.1–1.0)
Monocytes Relative: 5 %
Neutro Abs: 12.1 10*3/uL — ABNORMAL HIGH (ref 1.7–7.7)
Neutrophils Relative %: 85 %
Platelets: 273 10*3/uL (ref 150–400)
RBC: 3.98 MIL/uL (ref 3.87–5.11)
RDW: 13.7 % (ref 11.5–15.5)
WBC: 14 10*3/uL — ABNORMAL HIGH (ref 4.0–10.5)
nRBC: 0 % (ref 0.0–0.2)

## 2020-01-15 LAB — PROCALCITONIN: Procalcitonin: 1.07 ng/mL

## 2020-01-15 LAB — RESPIRATORY PANEL BY RT PCR (FLU A&B, COVID)
Influenza A by PCR: NEGATIVE
Influenza B by PCR: NEGATIVE
SARS Coronavirus 2 by RT PCR: NEGATIVE

## 2020-01-15 LAB — COMPREHENSIVE METABOLIC PANEL
ALT: 14 U/L (ref 0–44)
AST: 20 U/L (ref 15–41)
Albumin: 4.1 g/dL (ref 3.5–5.0)
Alkaline Phosphatase: 109 U/L (ref 38–126)
Anion gap: 10 (ref 5–15)
BUN: 24 mg/dL — ABNORMAL HIGH (ref 8–23)
CO2: 23 mmol/L (ref 22–32)
Calcium: 9.5 mg/dL (ref 8.9–10.3)
Chloride: 106 mmol/L (ref 98–111)
Creatinine, Ser: 1.59 mg/dL — ABNORMAL HIGH (ref 0.44–1.00)
GFR calc Af Amer: 33 mL/min — ABNORMAL LOW (ref >60)
GFR calc non Af Amer: 29 mL/min — ABNORMAL LOW (ref >60)
Glucose, Bld: 134 mg/dL — ABNORMAL HIGH (ref 70–99)
Potassium: 5.2 mmol/L — ABNORMAL HIGH (ref 3.5–5.1)
Sodium: 139 mmol/L (ref 135–145)
Total Bilirubin: 1.8 mg/dL — ABNORMAL HIGH (ref 0.3–1.2)
Total Protein: 7.1 g/dL (ref 6.5–8.1)

## 2020-01-15 LAB — LIPASE, BLOOD: Lipase: 25 U/L (ref 11–51)

## 2020-01-15 LAB — LACTIC ACID, PLASMA: Lactic Acid, Venous: 1 mmol/L (ref 0.5–1.9)

## 2020-01-15 LAB — CBG MONITORING, ED: Glucose-Capillary: 113 mg/dL — ABNORMAL HIGH (ref 70–99)

## 2020-01-15 MED ORDER — IOHEXOL 300 MG/ML  SOLN
100.0000 mL | Freq: Once | INTRAMUSCULAR | Status: AC | PRN
  Administered 2020-01-15: 100 mL via INTRAVENOUS

## 2020-01-15 MED ORDER — SODIUM CHLORIDE 0.9 % IV BOLUS
500.0000 mL | Freq: Once | INTRAVENOUS | Status: AC
  Administered 2020-01-15: 500 mL via INTRAVENOUS

## 2020-01-15 MED ORDER — GABAPENTIN 100 MG PO CAPS
100.0000 mg | ORAL_CAPSULE | Freq: Two times a day (BID) | ORAL | Status: DC
  Administered 2020-01-15 – 2020-01-19 (×8): 100 mg via ORAL
  Filled 2020-01-15 (×8): qty 1

## 2020-01-15 MED ORDER — ONDANSETRON HCL 4 MG/2ML IJ SOLN: 4.0000 mg | Freq: Four times a day (QID) | INTRAMUSCULAR | Status: DC | PRN

## 2020-01-15 MED ORDER — CLOPIDOGREL BISULFATE 75 MG PO TABS
75.0000 mg | ORAL_TABLET | Freq: Every day | ORAL | Status: DC
  Administered 2020-01-15 – 2020-01-19 (×5): 75 mg via ORAL
  Filled 2020-01-15 (×5): qty 1

## 2020-01-15 MED ORDER — ACETAMINOPHEN 650 MG RE SUPP: 650.0000 mg | Freq: Four times a day (QID) | RECTAL | Status: DC | PRN

## 2020-01-15 MED ORDER — DULOXETINE HCL 60 MG PO CPEP
60.0000 mg | ORAL_CAPSULE | Freq: Every day | ORAL | Status: DC
  Administered 2020-01-15 – 2020-01-19 (×5): 60 mg via ORAL
  Filled 2020-01-15 (×5): qty 1

## 2020-01-15 MED ORDER — ATORVASTATIN CALCIUM 80 MG PO TABS
80.0000 mg | ORAL_TABLET | Freq: Every day | ORAL | Status: DC
  Administered 2020-01-15 – 2020-01-19 (×5): 80 mg via ORAL
  Filled 2020-01-15 (×5): qty 1

## 2020-01-15 MED ORDER — ONDANSETRON HCL 4 MG PO TABS: 4.0000 mg | ORAL_TABLET | Freq: Four times a day (QID) | ORAL | Status: DC | PRN

## 2020-01-15 MED ORDER — ACETAMINOPHEN 325 MG PO TABS: 650.0000 mg | ORAL_TABLET | Freq: Four times a day (QID) | ORAL | Status: DC | PRN

## 2020-01-15 MED ORDER — PANTOPRAZOLE SODIUM 40 MG PO TBEC
40.0000 mg | DELAYED_RELEASE_TABLET | Freq: Two times a day (BID) | ORAL | Status: DC
  Administered 2020-01-15 – 2020-01-19 (×8): 40 mg via ORAL
  Filled 2020-01-15 (×8): qty 1

## 2020-01-15 MED ORDER — METOPROLOL TARTRATE 25 MG PO TABS
25.0000 mg | ORAL_TABLET | Freq: Two times a day (BID) | ORAL | Status: DC
  Administered 2020-01-15 – 2020-01-19 (×8): 25 mg via ORAL
  Filled 2020-01-15 (×8): qty 1

## 2020-01-15 MED ORDER — SODIUM CHLORIDE 0.9 % IV SOLN: INTRAVENOUS | Status: AC

## 2020-01-15 MED ORDER — QUETIAPINE FUMARATE 25 MG PO TABS
25.0000 mg | ORAL_TABLET | Freq: Every day | ORAL | Status: DC
  Administered 2020-01-15 – 2020-01-18 (×4): 25 mg via ORAL
  Filled 2020-01-15 (×4): qty 1

## 2020-01-15 MED ORDER — LEVOTHYROXINE SODIUM 88 MCG PO TABS
88.0000 ug | ORAL_TABLET | Freq: Every day | ORAL | Status: DC
  Administered 2020-01-16: 88 ug via ORAL
  Filled 2020-01-15: qty 1

## 2020-01-15 MED ORDER — INSULIN ASPART 100 UNIT/ML ~~LOC~~ SOLN
0.0000 [IU] | SUBCUTANEOUS | Status: DC
  Administered 2020-01-16: 1 [IU] via SUBCUTANEOUS
  Administered 2020-01-16 – 2020-01-17 (×3): 2 [IU] via SUBCUTANEOUS

## 2020-01-15 NOTE — ED Provider Notes (Signed)
[facility] DEPART[facility] Provider Note   CSN: [id number] Arrival date & time: 01/15/20  1456     History Chief Complaint  Patient presents with  . Diarrhea  . Altered Mental Status    Janet Steele is a 86 y.o. female.  HPI   Pt is an 86 y/o female with a h/o anemia, anxiety, arthritis, CAD, carotid bruit, DDD, GERD, HLD, HTN, hypothyroidism, ischemic cardiomyopathy, MI, PVD, T2DM, diverticulosis, who presents to the ED today via EMS for eval of AMS and diarrhea.   Level 5 caveat as pt is altered and cannot provide a reliable history.   3:43 PM Attempted to contact the patient's husband at mobile phone and reached voicemail. Voice mailbox was not set up yet.   3:45 PM Attempted to contact patient's husband at home phone listed in chart x2 and the line was busy.   4:40 PM Discussed case with pt's son, Coralyn Mark, at bedside.  He states that the patient has a history of prior CVA in 2018 and since then has had intermittent aphasia.  He states that she also has intermittent confusion and "has good days and bad days ".  Today she is at her baseline from when she has had intermittent confusion in the past.  He notes that the patient had eaten a lot of watermelon last night and this morning was found to have multiple episodes of diarrhea.  There is no obvious blood.  She did not complain of any abdominal pain.  She was not being cooperative with standing up or getting cleaned up so her husband called her son who then called EMS to come over and assist her to go to the ED for evaluation.  Past Medical History:  Diagnosis Date  . Anemia   . Anxiety   . Arthritis    "fingers" (05/19/2016  . CAD (coronary artery disease)    a. s/p CABG 1982 b. redo CABG 2003. c. [place] despite med rx 05/2016: successful but complicated PCI of PDA beyond graft site, c/b localized dissection requiring overlapping stent.  . Carotid bruit    a. Duplex 01/2015: patent vessels, 1-39% BICA, f/u  PRN recommened.  . Colon polyp    adenomatous  . Colon, diverticulosis   . Degenerative joint disease   . Diastolic dysfunction   . Esophageal stricture   . GERD (gastroesophageal reflux disease)   . Hiatal hernia   . History of blood transfusion    "related to my bypass"  . Hyperlipidemia   . Hypertension   . Hypothyroidism   . Ischemic cardiomyopathy    a. Cath 05/2016: EF 40% with severe inferior wall HK, suspect hibernating myocardium.  . Myocardial infarction ([place]) 1977; 1982  . Osteoarthrosis, unspecified whether generalized or localized, unspecified site   . Presence of permanent cardiac pacemaker   . PVD (peripheral vascular disease) ([place])   . Type II diabetes mellitus ([place])   . Unspecified hearing loss     Patient Active Problem List   Diagnosis Date Noted  . Headache 07/21/2019  . Lower abdominal pain 02/05/2019  . Other headache syndrome 08/14/2018  . Depression 08/14/2018  . Weight loss 06/05/2018  . Stroke ([place]) 11/04/2017  . Weakness 11/03/2017  . History of cerebrovascular accident (CVA) with residual deficit 11/03/2017  . Chronic back pain 07/04/2017  . PAD (peripheral artery disease) ([place]) 01/17/2017  . Overactive bladder 12/17/2016  . Anemia 05/20/2016  . Coronary artery disease involving coronary bypass graft of native heart  without angina pectoris 05/19/2016  . Numbness and tingling of foot 12/31/2015  . MCI (mild cognitive impairment) with memory loss 11/18/2015  . Facial tic 05/07/2015  . Sick sinus syndrome ([place]) 02/26/2015  . Cervical dystonia 12/04/2014  . Chronic motor tic 09/12/2014  . Pacemaker-Medtronic 07/07/2012  . GERD (gastroesophageal reflux disease) 07/05/2012  . Cough 08/04/2010  . Abnormal involuntary movement 12/17/2009  . Disturbance in sleep behavior 12/11/2009  . CAD, ARTERY BYPASS GRAFT 09/24/2009  . CAROTID BRUIT 09/24/2009  . Diabetes ([place]) 01/30/2009  . DYSPHAGIA 06/19/2008  . Anxiety 02/14/2008  . EROSIVE ESOPHAGITIS  02/14/2008  . CONSTIPATION, CHRONIC 02/14/2008  . DEGENERATIVE JOINT DISEASE 02/14/2008  . HEARING LOSS 01/04/2008  . PVD 01/04/2008  . Hypothyroidism 09/25/2007  . HYPERLIPIDEMIA 09/25/2007  . Essential hypertension 09/07/2007  . Diabetic polyneuropathy ([place]) 03/16/2007  . Stricture and stenosis of esophagus 03/16/2007  . HIATAL HERNIA 02/16/2007  . HEMORRHOIDS 12/31/1997  . DIVERTICULOSIS, COLON 12/31/1997    Past Surgical History:  Procedure Laterality Date  . ABDOMINAL HYSTERECTOMY    . APPENDECTOMY    . BACK SURGERY    . CARDIAC CATHETERIZATION N/A 05/19/2016   Procedure: Left Heart Cath and Cors/Grafts Angiography;  Surgeon: Belva Crome, MD;  Location: [facility];  Service: Cardiovascular;  Laterality: N/A;  . CARDIAC CATHETERIZATION N/A 05/19/2016   Procedure: Coronary Stent Intervention;  Surgeon: Belva Crome, MD;  Location: [facility];  Service: Cardiovascular;  Laterality: N/A;  . CATARACT EXTRACTION W/ INTRAOCULAR LENS  IMPLANT, BILATERAL Bilateral   . CHOLECYSTECTOMY OPEN    . COLONOSCOPY    . CORONARY ANGIOPLASTY WITH STENT PLACEMENT  05/19/2016   "2 stents?"  . CORONARY ARTERY BYPASS GRAFT  1982; 2003   x4(1982),02-2002 CABG X2  . ESOPHAGOGASTRODUODENOSCOPY (EGD) WITH ESOPHAGEAL DILATION  "2-3 times"  . INSERT / REPLACE / REMOVE PACEMAKER    . LEFT HEART CATHETERIZATION WITH CORONARY/GRAFT ANGIOGRAM N/A 03/05/2014   Procedure: LEFT HEART CATHETERIZATION WITH Beatrix Fetters;  Surgeon: Sinclair Grooms, MD;  Location: [facility] C[facility] LAB;  Service: Cardiovascular;  Laterality: N/A;  . LUMBAR [place] SURGERY  01-2000  . OOPHORECTOMY Bilateral   . PERMANENT PACEMAKER INSERTION N/A 07/06/2012   MDT Adapta L implanted by Dr Rayann Heman for symptomatic bradycardia  . TONSILLECTOMY  1930s     OB History   No obstetric history on file.     Family History  Problem Relation Age of Onset  . Heart disease Sister   . Heart attack Sister   . Colon cancer Neg  Hx   . Breast cancer Neg Hx   . Celiac disease Neg Hx   . Cirrhosis Neg Hx   . Clotting disorder Neg Hx   . Colitis Neg Hx   . Colon polyps Neg Hx   . Crohn's disease Neg Hx   . Cystic fibrosis Neg Hx   . Diabetes Neg Hx   . Esophageal cancer Neg Hx   . Hemochromatosis Neg Hx   . Inflammatory bowel disease Neg Hx   . Irritable bowel syndrome Neg Hx   . Kidney disease Neg Hx   . Liver cancer Neg Hx   . Liver disease Neg Hx   . Ovarian cancer Neg Hx   . Pancreatic cancer Neg Hx   . Prostate cancer Neg Hx   . Rectal cancer Neg Hx   . Stomach cancer Neg Hx   . Ulcerative colitis Neg Hx   . Uterine cancer Neg  Hx   . Wilson's disease Neg Hx     Social History   Tobacco Use  . Smoking status: Former Smoker    Packs/day: 0.50    Years: 30.00    Pack years: 15.00    Types: Cigarettes    Quit date: 07/05/1976    Years since quitting: 43.5  . Smokeless tobacco: Never Used  Substance Use Topics  . Alcohol use: No    Alcohol/week: 3.0 standard drinks    Types: 3 Glasses of wine per week    Comment: "not anymore, I haven't in I don't know years"  . Drug use: No    Home Medications Prior to Admission medications   Medication Sig Start Date End Date Taking? Authorizing Provider  acetaminophen (TYLENOL) 500 MG tablet Take 2 tablets (1,000 mg total) by mouth every 6 (six) hours as needed. 03/21/17   Nche, Charlene Brooke, NP  atorvastatin (LIPITOR) 80 MG tablet TAKE 1 TABLET BY MOUTH EVERY DAY 08/13/19   Binnie Rail, MD  clopidogrel (PLAVIX) 75 MG tablet TAKE 1 TABLET BY MOUTH EVERY DAY 10/10/19   Burns, Claudina Lick, MD  DULoxetine (CYMBALTA) 60 MG capsule TAKE 1 CAPSULE(60 MG) BY MOUTH DAILY 11/09/19   Burns, Claudina Lick, MD  gabapentin (NEURONTIN) 100 MG capsule Take 1 capsule (100 mg total) by mouth 2 (two) times daily. 10/22/19   Binnie Rail, MD  guaiFENesin (MUCINEX) 600 MG 12 hr tablet Take 1 tablet (600 mg total) by mouth 2 (two) times daily. Take with lots of liquid, hot liquids.  09/25/19   Dohmeier, Asencion [facility], MD  JANUVIA 50 MG tablet Take 50 mg by mouth daily. 09/04/18   [provider]  levothyroxine (SYNTHROID) 88 MCG tablet TAKE 1 TABLET(88 MCG) BY MOUTH DAILY 10/10/19   Burns, Claudina Lick, MD  losartan (COZAAR) 25 MG tablet TAKE 1 TABLET(25 MG) BY MOUTH DAILY 10/10/19   Binnie Rail, MD  metFORMIN (GLUCOPHAGE) 1000 MG tablet TAKE 1 TABLET BY MOUTH TWICE DAILY WITH MEALS 09/10/19   Burns, Claudina Lick, MD  metoprolol tartrate (LOPRESSOR) 25 MG tablet TAKE 1 TABLET BY MOUTH TWICE DAILY 10/10/19   Burns, Claudina Lick, MD  nitroGLYCERIN (NITROSTAT) 0.4 MG SL tablet Place 1 tablet (0.4 mg total) under the tongue every 5 (five) minutes as needed. For chest pain 05/18/16   Sherren Mocha, MD  pantoprazole (PROTONIX) 40 MG tablet TAKE 1 TABLET BY MOUTH TWICE DAILY 10/10/19   Binnie Rail, MD  QUEtiapine (SEROQUEL) 25 MG tablet TAKE 1 TABLET BY MOUTH AT BEDTIME. START AFTER MIRTAZIPINE IS TAPERED OFF 09/10/19   Binnie Rail, MD    Allergies    Morphine, Penicillins, Prednisone, Sulfonamide derivatives, Codeine, Hydrocodone-acetaminophen, Meperidine hcl, Metoclopramide hcl, Metoprolol succinate, Moxifloxacin, Oxycodone-aspirin, Pentazocine lactate, and Pioglitazone  Review of Systems   Review of Systems  Unable to perform ROS: Mental status change    Physical Exam Updated Vital Signs BP (!) 151/54   Pulse 71   Temp 98.7 F (37.1 C) (Oral)   Resp (!) 22   SpO2 (!) 79%   Physical Exam Vitals and nursing note reviewed.  Constitutional:      General: She is not in acute distress.    Appearance: She is well-developed.  HENT:     Head: Normocephalic and atraumatic.  Eyes:     Conjunctiva/sclera: Conjunctivae normal.  Cardiovascular:     Rate and Rhythm: Normal rate and regular rhythm.     Heart sounds: No murmur.  Pulmonary:  Effort: Pulmonary effort is normal. No respiratory distress.     Breath sounds: Normal breath sounds.  Abdominal:     General: Bowel sounds  are normal.     Palpations: Abdomen is soft.     Comments: Pt reports multiple areas of TTP, but exam is inconsistent. She is intermittently guarding in different areas.  Musculoskeletal:     Cervical back: Neck supple.  Skin:    General: Skin is warm and dry.  Neurological:     Mental Status: She is alert.     Comments: Mental Status:  Alert, disoriented x4.  Able to follow simple commands. Does not answer most questions appropriate.  Cranial Nerves:  II:  pupils equal, round, reactive to light III,IV, VI: ptosis not present, extra-ocular motions intact bilaterally  V,VII: smile symmetric, facial light touch sensation equal VIII: hearing grossly normal to voice  X: uvula elevates symmetrically  XI: bilateral shoulder shrug symmetric and strong XII: midline tongue extension without fassiculations Motor:  Normal tone. 5/5 strength of BUE and BLE major muscle groups including strong and equal grip strength and dorsiflexion/plantar flexion Sensory: light touch normal in all extremities. Cerebellar: pt unable to participate in this portion of thexam Gait: not assessed CV: 2+ radial and DP/PT pulses     ED Results / Procedures / Treatments   Labs (all labs ordered are listed, but only abnormal results are displayed) Labs Reviewed  CBC WITH DIFFERENTIAL/PLATELET - Abnormal; Notable for the following components:      Result Value   WBC 14.0 (*)    Hemoglobin 10.9 (*)    HCT 34.4 (*)    Neutro Abs 12.1 (*)    All other components within normal limits  COMPREHENSIVE METABOLIC PANEL - Abnormal; Notable for the following components:   Potassium 5.2 (*)    Glucose, Bld 134 (*)    BUN 24 (*)    Creatinine, Ser 1.59 (*)    Total Bilirubin 1.8 (*)    GFR calc non Af Amer 29 (*)    GFR calc Af Amer 33 (*)    All other components within normal limits  C DIFFICILE QUICK SCREEN W PCR REFLEX  GI PATHOGEN PANEL BY PCR, STOOL  RESPIRATORY PANEL BY RT PCR (FLU A&B, COVID)  LIPASE, BLOOD   URINALYSIS, ROUTINE W REFLEX MICROSCOPIC    EKG EKG Interpretation  Date/Time:  Tuesday January 15 2020 15:15:23 EST Ventricular Rate:  65 PR Interval:    QRS Duration: 120 QT Interval:  454 QTC Calculation: 473 R Axis:   84 Text Interpretation: Sinus rhythm Nonspecific intraventricular conduction delay Non-specific ST-t changes Confirmed by Lajean Saver [phone]) on 01/15/2020 3:22:47 PM   Radiology CT Head Wo Contrast  Result Date: 01/15/2020 CLINICAL DATA:  Altered mental status EXAM: CT HEAD WITHOUT CONTRAST TECHNIQUE: Contiguous axial images were obtained from the base of the skull through the vertex without intravenous contrast. COMPARISON:  10/26/2019 FINDINGS: Brain: There is atrophy and chronic small vessel disease changes. Old left basal ganglia lacunar infarct. No acute intracranial abnormality. Specifically, no hemorrhage, hydrocephalus, mass lesion, acute infarction, or significant intracranial injury. Vascular: No hyperdense vessel or unexpected calcification. Skull: No acute calvarial abnormality. Sinuses/Orbits: Visualized paranasal sinuses and mastoids clear. Orbital soft tissues unremarkable. Other: None IMPRESSION: Atrophy, chronic microvascular disease. No acute intracranial abnormality. Old left basal ganglia lacunar infarct. Electronically Signed   By: Rolm Baptise M.D.   On: 01/15/2020 17:21   CT ABDOMEN PELVIS W CONTRAST  Result Date: 01/15/2020 CLINICAL  DATA:  86 year old female with diarrhea and leukocytosis. History of diverticulosis. EXAM: CT ABDOMEN AND PELVIS WITH CONTRAST TECHNIQUE: Multidetector CT imaging of the abdomen and pelvis was performed using the standard protocol following bolus administration of intravenous contrast. CONTRAST:  124mL OMNIPAQUE IOHEXOL 300 MG/ML  SOLN COMPARISON:  CT of the abdomen pelvis dated 06/26/2018. FINDINGS: Lower chest: The visualized lung bases are clear. Cardiac pacemaker leads noted. There is no intra-abdominal free air or  free fluid. Hepatobiliary: There is a 2.5 x 1.3 cm lesion in the right lobe of the liver with nodular enhancement on arterial phase and delayed central enhancement. This is suboptimally characterized but the CT findings are most consistent with a hemangioma. This can be better evaluated with MRI on a nonemergent basis. The liver is otherwise unremarkable. There is mild intrahepatic biliary ductal dilatation, likely related to cholecystectomy. The common bile duct measures approximately 14 mm in diameter. No retained calcified stone noted in the central CBD. Pancreas: Unremarkable. No pancreatic ductal dilatation or surrounding inflammatory changes. Spleen: Normal in size without focal abnormality. Adrenals/Urinary Tract: The adrenal glands are unremarkable. There is no hydronephrosis on either side. There is symmetric enhancement of the kidneys. There is a 1 cm left renal upper pole cyst. The visualized ureters and urinary bladder appear unremarkable. Stomach/Bowel: Evaluation of the bowel is limited in the absence of oral contrast. There is sigmoid diverticulosis. There is thickened appearance of the distal colon and rectosigmoid favored to represent colitis rather than diverticulitis. There is loose stool within the colon compatible with diarrheal state. Correlation with clinical exam and stool cultures recommended. There is no bowel obstruction. The appendix is not visualized with certainty. No inflammatory changes identified in the right lower quadrant. Vascular/Lymphatic: Advanced aortoiliac atherosclerotic disease. The IVC is unremarkable. No portal venous gas. The aorta is ectatic measuring up to 2.3 cm in diameter. There is no adenopathy. Reproductive: Hysterectomy.  No adnexal masses. Other: Midline vertical anterior pelvic wall incisional scar. Musculoskeletal: Degenerative changes of the spine. 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 No cortical collapse or acute fracture. Grade 1  L4-L5 anterolisthesis. IMPRESSION: 1. Diarrheal state with findings of colitis. Correlation with clinical exam and stool cultures recommended. No bowel obstruction. 2. Sigmoid diverticulosis. 3. Probable right hepatic hemangioma. MRI may provide better characterization. 4.  Aortic Atherosclerosis (ICD10-I70.0). Electronically Signed   By: Anner Crete M.D.   On: 01/15/2020 17:32    Procedures Procedures (including critical care time)  Medications Ordered in ED Medications  sodium chloride 0.9 % bolus 500 mL (has no administration in time range)  sodium chloride 0.9 % bolus 500 mL (500 mLs Intravenous New Bag/Given 01/15/20 1721)  iohexol (OMNIPAQUE) 300 MG/ML solution 100 mL (100 mLs Intravenous Contrast Given 01/15/20 1719)    ED Course  I have reviewed the triage vital signs and the nursing notes.  Pertinent labs & imaging results that were available during my care of the patient were reviewed by me and considered in my medical decision making (see chart for details).    MDM Rules/Calculators/A&P                      86 year old female presenting for evaluation of diarrhea and altered mental status per EMS.  Patient is unable to provide any reliable history.  VS reassuring.   CBC with leukocytosis of 14, anemia present, appears consistent with baseline CMP with mildly elevated potassium at 5.2, elevated BUN/creatinine at 24/1.59 which is new  from prior.  Total bili slightly elevated 1.8 but LFTs are normal. Lipase negative UA pending at the time of admission COVID pending at the time of admission  EKG Sinus rhythm Nonspecific intraventricular conduction delay Non-specific ST-t changes   CT head with atrophy, chronic microvascular disease. No acute intracranial abnormality. Old left basal ganglia lacunar infarct.  CT abd/pelvis with  diarrheal state with findings of colitis. No bowel obstruction. Sigmoid diverticulosis without diverticulitis  Will admit for further tx of  colitis, dehydration, and aki  7:03 PM CONSULT with Dr. Roel Cluck who accepts patient for admission.   Final Clinical Impression(s) / ED Diagnoses Final diagnoses:  Colitis  AKI (acute kidney injury) [facility])  Dehydration    Rx / DC Orders ED Discharge Orders    None       Bishop Dublin 01/15/20 1905    Lajean Saver, MD 01/15/20 2335

## 2020-01-15 NOTE — ED Notes (Signed)
Report attempted, nurse to call back

## 2020-01-15 NOTE — ED Triage Notes (Signed)
Presents via EMS with c/o diarrhea since this AM and increased confusion. Pt alert to person and place only.  No acute pain responses and denies any N/V.  No pain responses noted with palp to abd by this nurse.  Pt immediately asks if she can go home.

## 2020-01-15 NOTE — ED Notes (Signed)
To CT   Family at bedside.

## 2020-01-15 NOTE — H&P (Signed)
Janet Steele BOF:[id number] DOB: 1929-07-20 DOA: 01/15/2020     PCP: Binnie Rail, MD   Outpatient Specialists:  CARDS:  Dr. Burt Knack   NEurology  Dr. Jaynee Eagles    Patient arrived to ER on 01/15/20 at 1456  Patient coming from: home Lives  With family    Chief Complaint:   Chief Complaint  Patient presents with  . Diarrhea  . Altered Mental Status    HPI: Janet Steele is a 86 y.o. female with medical history significant of CAD sp CABG x2 2003, HTN, HLD, DM 2, symptomatic bradycardia sp pacemaker carotid bruit, chronic diastolic CHF, GERD, esophageal stricture, hypothyroidism history of CVA in 2018 with intermittent aphasia dementia with occasional confusion.    Presented with  Persistent diarrhea started this AM increased confusion patient denies any nausea vomiting associated with no pain.  Per son she have had a lot of watermelon yesterday and today started to have significant diarrhea no blood in stool no abdominal pain patient not being cooperative at home to getting cleaned up after having diarrhea husband had to call EMS because he needed help.  Denies any fever/ chills No sick contacts No recent antibiotic exposure  family denies any other unusual food  Pt received her first dose of Pfizer COVID-19 Vaccine on 01/10/2020  Infectious risk factors:  Reports /Diarrhea  In  ER RAPID COVID TEST  NEGATIVE     Lab Results  Component Value Date   [place] NEGATIVE 01/15/2020     Regarding pertinent Chronic problems:     Hyperlipidemia -  on statins Lipitor   HTN on metoprolol and COzaar   chronic CHF diastolic - last echo 77/82/4235  EF: 60% -  36% (grade 1 diastolic dysfunction).   CAD  - On Aspirin, statin, betablocker, Plavix                 -  followed by cardiology    DM 2 -  Lab Results  Component Value Date   HGBA1C 6.9 (H) 08/17/2019    on  PO meds only    Hypothyroidism:  Lab Results  Component Value Date   TSH 15.81 (H) 08/17/2019   on  synthroid     Hx of CVA 2018-   With  residual deficits on   Plavix    While in ER: Found to have AKI leukocytosis  CT showing colitis   The following Work up has been ordered so far:  Orders Placed This Encounter  Procedures  . C difficile quick scan w PCR reflex  . GI pathogen panel by PCR, stool  . Respiratory Panel by RT PCR (Flu A&B, Covid) - Nasopharyngeal Swab  . CT Head Wo Contrast  . CT ABDOMEN PELVIS W CONTRAST  . CBC with Differential  . Comprehensive metabolic panel  . Lipase, blood  . Urinalysis, Routine w reflex microscopic  . In and Out Cath  . Consult to hospitalist  ALL PATIENTS BEING ADMITTED/HAVING PROCEDURES NEED COVID-19 SCREENING  . Enteric precautions (UV disinfection) C difficile, Norovirus  . EKG 12-Lead     Following Medications were ordered in ER: Medications  sodium chloride 0.9 % bolus 500 mL (has no administration in time range)  sodium chloride 0.9 % bolus 500 mL (500 mLs Intravenous New Bag/Given 01/15/20 1721)  iohexol (OMNIPAQUE) 300 MG/ML solution 100 mL (100 mLs Intravenous Contrast Given 01/15/20 1719)        Consult Orders  (From admission, onward)  Start     Ordered   01/15/20 1844  Consult to hospitalist  ALL PATIENTS BEING ADMITTED/HAVING PROCEDURES NEED COVID-19 SCREENING  Once    Comments: ALL PATIENTS BEING ADMITTED/HAVING PROCEDURES NEED COVID-19 SCREENING  Provider:  (Not yet assigned)  Question Answer Comment  Place call to: [facility]   Reason for Consult Admit      01/15/20 1843          Significant initial  Findings: Abnormal Labs Reviewed  CBC WITH DIFFERENTIAL/PLATELET - Abnormal; Notable for the following components:      Result Value   WBC 14.0 (*)    Hemoglobin 10.9 (*)    HCT 34.4 (*)    Neutro Abs 12.1 (*)    All other components within normal limits  COMPREHENSIVE METABOLIC PANEL - Abnormal; Notable for the following components:   Potassium 5.2 (*)    Glucose, Bld 134 (*)    BUN 24  (*)    Creatinine, Ser 1.59 (*)    Total Bilirubin 1.8 (*)    GFR calc non Af Amer 29 (*)    GFR calc Af Amer 33 (*)    All other components within normal limits  CBG MONITORING, ED - Abnormal; Notable for the following components:   Glucose-Capillary 113 (*)    All other components within normal limits     Otherwise labs showing:    Recent Labs  Lab 01/15/20 1630  NA 139  K 5.2*  CO2 23  GLUCOSE 134*  BUN 24*  CREATININE 1.59*  CALCIUM 9.5     Cr    Up from baseline see below Lab Results  Component Value Date   CREATININE 1.59 (H) 01/15/2020   CREATININE 1.06 08/17/2019   CREATININE 1.16 01/02/2019    Recent Labs  Lab 01/15/20 1630  AST 20  ALT 14  ALKPHOS 109  BILITOT 1.8*  PROT 7.1  ALBUMIN 4.1   Lab Results  Component Value Date   CALCIUM 9.5 01/15/2020     WBC      Component Value Date/Time   WBC 14.0 (H) 01/15/2020 1630   ANC    Component Value Date/Time   NEUTROABS 12.1 (H) 01/15/2020 1630   ALC No components found for: LYMPHAB    Plt: Lab Results  Component Value Date   PLT 273 01/15/2020     Lactic Acid, Venous    Component Value Date/Time   LATICACIDVEN 1.0 01/15/2020 2013    Procalcitonin 1.07   COVID-19 Labs    Lab Results  Component Value Date   SARSCOV2NAA NEGATIVE 01/15/2020       HG/HCT  stable,      Component Value Date/Time   HGB 10.9 (L) 01/15/2020 1630   HCT 34.4 (L) 01/15/2020 1630   Recent Labs  Lab 01/15/20 1630  LIPASE 25   No results for input(s): AMMONIA in the last 168 hours.  No components found for: LABALBU    ECG: Ordered Personally reviewed by me showing: HR :  65 Rhythm: NSR,   no evidence of ischemic changes QTC 473   DM  labs:  HbA1C: Recent Labs    08/17/19 1159  HGBA1C 6.9*     CBG (last 3)  No results for input(s): GLUCAP in the last 72 hours.     UA  not ordered   Ordered  CT HEAD  NON acute    CTabd/pelvis - colitis    ED Triage Vitals  Enc Vitals Group  BP 01/15/20 1510 (!) 149/73     Pulse Rate 01/15/20 1532 60     Resp 01/15/20 1532 17     Temp 01/15/20 1605 98.7 F (37.1 C)     Temp Source 01/15/20 1605 Oral     SpO2 01/15/20 1532 100 %     Weight --      Height --      Head Circumference --      Peak Flow --      Pain Score --      Pain Loc --      Pain Edu? --      Excl. in [place]? --   TMAX(24)@       Latest  Blood pressure (!) 151/54, pulse 71, temperature 98.7 F (37.1 C), temperature source Oral, resp. rate (!) 22, SpO2 (!) 79 %. erroneous?    Hospitalist was called for admission for Colitis   Review of Systems:    Pertinent positives include:  diarrhea  Constitutional:  No weight loss, night sweats, Fevers, chills, fatigue, weight loss  HEENT:  No headaches, Difficulty swallowing,Tooth/dental problems,Sore throat,  No sneezing, itching, ear ache, nasal congestion, post nasal drip,  Cardio-vascular:  No chest pain, Orthopnea, PND, anasarca, dizziness, palpitations.no Bilateral lower extremity swelling  GI:  No heartburn, indigestion, abdominal pain, nausea, vomiting,, change in bowel habits, loss of appetite, melena, blood in stool, hematemesis Resp:  no shortness of breath at rest. No dyspnea on exertion, No excess mucus, no productive cough, No non-productive cough, No coughing up of blood.No change in color of mucus.No wheezing. Skin:  no rash or lesions. No jaundice GU:  no dysuria, change in color of urine, no urgency or frequency. No straining to urinate.  No flank pain.  Musculoskeletal:  No joint pain or no joint swelling. No decreased range of motion. No back pain.  Psych:  No change in mood or affect. No depression or anxiety. No memory loss.  Neuro: no localizing neurological complaints, no tingling, no weakness, no double vision, no gait abnormality, no slurred speech, no confusion  All systems reviewed and apart from [place] all are negative  Past Medical History:   Past Medical History:    Diagnosis Date  . Anemia   . Anxiety   . Arthritis    "fingers" (05/19/2016  . CAD (coronary artery disease)    a. s/p CABG 1982 b. redo CABG 2003. c. [place] despite med rx 05/2016: successful but complicated PCI of PDA beyond graft site, c/b localized dissection requiring overlapping stent.  . Carotid bruit    a. Duplex 01/2015: patent vessels, 1-39% BICA, f/u PRN recommened.  . Colon polyp    adenomatous  . Colon, diverticulosis   . Degenerative joint disease   . Diastolic dysfunction   . Esophageal stricture   . GERD (gastroesophageal reflux disease)   . Hiatal hernia   . History of blood transfusion    "related to my bypass"  . Hyperlipidemia   . Hypertension   . Hypothyroidism   . Ischemic cardiomyopathy    a. Cath 05/2016: EF 40% with severe inferior wall HK, suspect hibernating myocardium.  . Myocardial infarction ([place]) 1977; 1982  . Osteoarthrosis, unspecified whether generalized or localized, unspecified site   . Presence of permanent cardiac pacemaker   . PVD (peripheral vascular disease) ([place])   . Type II diabetes mellitus ([place])   . Unspecified hearing loss      Past Surgical History:  Procedure Laterality Date  . ABDOMINAL  HYSTERECTOMY    . APPENDECTOMY    . BACK SURGERY    . CARDIAC CATHETERIZATION N/A 05/19/2016   Procedure: Left Heart Cath and Cors/Grafts Angiography;  Surgeon: Belva Crome, MD;  Location: [facility];  Service: Cardiovascular;  Laterality: N/A;  . CARDIAC CATHETERIZATION N/A 05/19/2016   Procedure: Coronary Stent Intervention;  Surgeon: Belva Crome, MD;  Location: [facility];  Service: Cardiovascular;  Laterality: N/A;  . CATARACT EXTRACTION W/ INTRAOCULAR LENS  IMPLANT, BILATERAL Bilateral   . CHOLECYSTECTOMY OPEN    . COLONOSCOPY    . CORONARY ANGIOPLASTY WITH STENT PLACEMENT  05/19/2016   "2 stents?"  . CORONARY ARTERY BYPASS GRAFT  1982; 2003   x4(1982),02-2002 CABG X2  . ESOPHAGOGASTRODUODENOSCOPY (EGD) WITH ESOPHAGEAL  DILATION  "2-3 times"  . INSERT / REPLACE / REMOVE PACEMAKER    . LEFT HEART CATHETERIZATION WITH CORONARY/GRAFT ANGIOGRAM N/A 03/05/2014   Procedure: LEFT HEART CATHETERIZATION WITH Beatrix Fetters;  Surgeon: Sinclair Grooms, MD;  Location: [facility];  Service: Cardiovascular;  Laterality: N/A;  . LUMBAR [employer] SURGERY  01-2000  . OOPHORECTOMY Bilateral   . PERMANENT PACEMAKER INSERTION N/A 07/06/2012   MDT Adapta L implanted by Dr Rayann Heman for symptomatic bradycardia  . TONSILLECTOMY  1930s    Social History:  Ambulatory  walker        reports that she quit smoking about 43 years ago. Her smoking use included cigarettes. She has a 15.00 pack-year smoking history. She has never used smokeless tobacco. She reports that she does not drink alcohol or use drugs.   Family History:   Family History  Problem Relation Age of Onset  . Heart disease Sister   . Heart attack Sister   . Colon cancer Neg Hx   . Breast cancer Neg Hx   . Celiac disease Neg Hx   . Cirrhosis Neg Hx   . Clotting disorder Neg Hx   . Colitis Neg Hx   . Colon polyps Neg Hx   . Crohn's disease Neg Hx   . Cystic fibrosis Neg Hx   . Diabetes Neg Hx   . Esophageal cancer Neg Hx   . Hemochromatosis Neg Hx   . Inflammatory bowel disease Neg Hx   . Irritable bowel syndrome Neg Hx   . Kidney disease Neg Hx   . Liver cancer Neg Hx   . Liver disease Neg Hx   . Ovarian cancer Neg Hx   . Pancreatic cancer Neg Hx   . Prostate cancer Neg Hx   . Rectal cancer Neg Hx   . Stomach cancer Neg Hx   . Ulcerative colitis Neg Hx   . Uterine cancer Neg Hx   . Wilson's disease Neg Hx     Allergies: Allergies  Allergen Reactions  . Morphine Hives, Itching and Rash  . Penicillins Itching and Rash    Has patient had a PCN reaction causing immediate rash, facial/tongue/throat swelling, SOB or lightheadedness with hypotension: Yes Has patient had a PCN reaction causing severe rash involving mucus membranes or skin  necrosis: No Has patient had a PCN reaction that required hospitalization No Has patient had a PCN reaction occurring within the last 10 years: No If all of the above answers are "NO", then may proceed with Cephalosporin use.   . Prednisone Other (See Comments)    MEMORY LOSS   . Sulfonamide Derivatives Itching and Rash  . Codeine Rash  . Hydrocodone-Acetaminophen Other (See Comments)  unknown  . Meperidine Hcl Itching  . Metoclopramide Hcl Itching and Rash  . Metoprolol Succinate Other (See Comments)    nervous  . Moxifloxacin Itching  . Oxycodone-Aspirin Rash  . Pentazocine Lactate Nausea Only  . Pioglitazone Other (See Comments)    bloating     Prior to Admission medications   Medication Sig Start Date End Date Taking? Authorizing Provider  acetaminophen (TYLENOL) 500 MG tablet Take 2 tablets (1,000 mg total) by mouth every 6 (six) hours as needed. 03/21/17   Nche, Charlene Brooke, NP  atorvastatin (LIPITOR) 80 MG tablet TAKE 1 TABLET BY MOUTH EVERY DAY 08/13/19   Binnie Rail, MD  clopidogrel (PLAVIX) 75 MG tablet TAKE 1 TABLET BY MOUTH EVERY DAY 10/10/19   Burns, Claudina Lick, MD  DULoxetine (CYMBALTA) 60 MG capsule TAKE 1 CAPSULE(60 MG) BY MOUTH DAILY 11/09/19   Burns, Claudina Lick, MD  gabapentin (NEURONTIN) 100 MG capsule Take 1 capsule (100 mg total) by mouth 2 (two) times daily. 10/22/19   Binnie Rail, MD  guaiFENesin (MUCINEX) 600 MG 12 hr tablet Take 1 tablet (600 mg total) by mouth 2 (two) times daily. Take with lots of liquid, hot liquids. 09/25/19   Dohmeier, Asencion Partridge, MD  JANUVIA 50 MG tablet Take 50 mg by mouth daily. 09/04/18   [provider]  levothyroxine (SYNTHROID) 88 MCG tablet TAKE 1 TABLET(88 MCG) BY MOUTH DAILY 10/10/19   Burns, Claudina Lick, MD  losartan (COZAAR) 25 MG tablet TAKE 1 TABLET(25 MG) BY MOUTH DAILY 10/10/19   Binnie Rail, MD  metFORMIN (GLUCOPHAGE) 1000 MG tablet TAKE 1 TABLET BY MOUTH TWICE DAILY WITH MEALS 09/10/19   Burns, Claudina Lick, MD  metoprolol  tartrate (LOPRESSOR) 25 MG tablet TAKE 1 TABLET BY MOUTH TWICE DAILY 10/10/19   Burns, Claudina Lick, MD  nitroGLYCERIN (NITROSTAT) 0.4 MG SL tablet Place 1 tablet (0.4 mg total) under the tongue every 5 (five) minutes as needed. For chest pain 05/18/16   Sherren Mocha, MD  pantoprazole (PROTONIX) 40 MG tablet TAKE 1 TABLET BY MOUTH TWICE DAILY 10/10/19   Binnie Rail, MD  QUEtiapine (SEROQUEL) 25 MG tablet TAKE 1 TABLET BY MOUTH AT BEDTIME. START AFTER MIRTAZIPINE IS TAPERED OFF 09/10/19   Binnie Rail, MD   Physical Exam: Blood pressure (!) 151/54, pulse 71, temperature 98.7 F (37.1 C), temperature source Oral, resp. rate (!) 22, SpO2 (!) 79 %. 1. General:  in No  Acute distress   Chronically ill  -appearing 2. Psychological: Alert and  Oriented 3. Head/ENT:    Dry Mucous Membranes                          Head Non traumatic, neck supple                           Poor Dentition 4. SKIN: decreased Skin turgor,  Skin clean Dry and intact no rash 5. Heart: Regular rate and rhythm no Murmur, no Rub or gallop 6. Lungs:   no wheezes or crackles   7. Abdomen: Soft,  non-tender, Non distended  bowel sounds present 8. Lower extremities: no clubbing, cyanosis, no  edema 9. Neurologically Grossly intact, moving all 4 extremities equally  10. MSK: Normal range of motion   All other LABS:     Recent Labs  Lab 01/15/20 1630  WBC 14.0*  NEUTROABS 12.1*  HGB 10.9*  HCT 34.4*  MCV 86.4  PLT 273     Recent Labs  Lab 01/15/20 1630  NA 139  K 5.2*  CL 106  CO2 23  GLUCOSE 134*  BUN 24*  CREATININE 1.59*  CALCIUM 9.5     Recent Labs  Lab 01/15/20 1630  AST 20  ALT 14  ALKPHOS 109  BILITOT 1.8*  PROT 7.1  ALBUMIN 4.1      Cultures:    Component Value Date/Time   SDES URINE, CATHETERIZED 11/03/2017 1536   SPECREQUEST NONE 11/03/2017 1536   CULT (A) 11/03/2017 1536    <10,000 COLONIES/mL INSIGNIFICANT GROWTH Performed at [facility] [address], [place],  [place] [address]    REPTSTATUS 11/04/2017 FINAL 11/03/2017 1536     Radiological Exams on Admission: CT Head Wo Contrast  Result Date: 01/15/2020 CLINICAL DATA:  Altered mental status EXAM: CT HEAD WITHOUT CONTRAST TECHNIQUE: Contiguous axial images were obtained from the base of the skull through the vertex without intravenous contrast. COMPARISON:  10/26/2019 FINDINGS: Brain: There is atrophy and chronic small vessel disease changes. Old left basal ganglia lacunar infarct. No acute intracranial abnormality. Specifically, no hemorrhage, hydrocephalus, mass lesion, acute infarction, or significant intracranial injury. Vascular: No hyperdense vessel or unexpected calcification. Skull: No acute calvarial abnormality. Sinuses/Orbits: Visualized paranasal sinuses and mastoids clear. Orbital soft tissues unremarkable. Other: None IMPRESSION: Atrophy, chronic microvascular disease. No acute intracranial abnormality. Old left basal ganglia lacunar infarct. Electronically Signed   By: Rolm Baptise M.D.   On: 01/15/2020 17:21   CT ABDOMEN PELVIS W CONTRAST  Result Date: 01/15/2020 CLINICAL DATA:  86 year old female with diarrhea and leukocytosis. History of diverticulosis. EXAM: CT ABDOMEN AND PELVIS WITH CONTRAST TECHNIQUE: Multidetector CT imaging of the abdomen and pelvis was performed using the standard protocol following bolus administration of intravenous contrast. CONTRAST:  116mL OMNIPAQUE IOHEXOL 300 MG/ML  SOLN COMPARISON:  CT of the abdomen pelvis dated 06/26/2018. FINDINGS: Lower chest: The visualized lung bases are clear. Cardiac pacemaker leads noted. There is no intra-abdominal free air or free fluid. Hepatobiliary: There is a 2.5 x 1.3 cm lesion in the right lobe of the liver with nodular enhancement on arterial phase and delayed central enhancement. This is suboptimally characterized but the CT findings are most consistent with a hemangioma. This can be better evaluated with MRI on a nonemergent basis. The  liver is otherwise unremarkable. There is mild intrahepatic biliary ductal dilatation, likely related to cholecystectomy. The common bile duct measures approximately 14 mm in diameter. No retained calcified stone noted in the central CBD. Pancreas: Unremarkable. No pancreatic ductal dilatation or surrounding inflammatory changes. Spleen: Normal in size without focal abnormality. Adrenals/Urinary Tract: The adrenal glands are unremarkable. There is no hydronephrosis on either side. There is symmetric enhancement of the kidneys. There is a 1 cm left renal upper pole cyst. The visualized ureters and urinary bladder appear unremarkable. Stomach/Bowel: Evaluation of the bowel is limited in the absence of oral contrast. There is sigmoid diverticulosis. There is thickened appearance of the distal colon and rectosigmoid favored to represent colitis rather than diverticulitis. There is loose stool within the colon compatible with diarrheal state. Correlation with clinical exam and stool cultures recommended. There is no bowel obstruction. The appendix is not visualized with certainty. No inflammatory changes identified in the right lower quadrant. Vascular/Lymphatic: Advanced aortoiliac atherosclerotic disease. The IVC is unremarkable. No portal venous gas. The aorta is ectatic measuring up to 2.3 cm in diameter. There is no adenopathy. Reproductive: Hysterectomy.  No adnexal masses. Other: Midline vertical anterior pelvic wall incisional scar. Musculoskeletal: Degenerative changes of the spine. 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 No cortical collapse or acute fracture. Grade 1 L4-L5 anterolisthesis. IMPRESSION: 1. Diarrheal state with findings of colitis. Correlation with clinical exam and stool cultures recommended. No bowel obstruction. 2. Sigmoid diverticulosis. 3. Probable right hepatic hemangioma. MRI may provide better characterization. 4.  Aortic Atherosclerosis (ICD10-I70.0). Electronically  Signed   By: Anner Crete M.D.   On: 01/15/2020 17:32    Chart has been reviewed    Assessment/Plan   86 y.o. female with medical history significant of CAD sp CABG x2 2003, HTN, HLD, DM 2, symptomatic bradycardia sp pacemaker carotid bruit, chronic diastolic CHF, GERD, esophageal stricture, hypothyroidism history of CVA in 2018 with intermittent aphasia dementia with occasional confusion.  Admitted for colitis  Present on Admission: . Colitis mild symptoms only for past 24 hours no associated fevers. Benign abdomen.  Evidence of dehydration. For tonight hold off on antibiotics obtain gastric panel.   No exposure to antibiotics to suggest C. Difficile .  Symptoms only ongoing for 24 hours Order clear liquid diet and observation Rehydrate  AKI likely secondary to dehydration obtain urine electrolytes and rehydrate for renal function  . Essential hypertension given hyperkalemia and AKI will hold Cozaar continue metoprolol  . GERD (gastroesophageal reflux disease) -   stable continue home medications   . Hypothyroidism - - Check TSH continue home medications at current dose  . HYPERLIPIDEMIA stable continue home medications   Dm 2-   Order Sensitive SSI   -  check TSH and HgA1C  - Hold by mouth medications    . Diabetic polyneuropathy ([place]) stable continue home medications    . Pacemaker-[employer]tr[employer] - stable  . Coronary artery disease involving coronary bypass graft of native heart without angina pectoris -  - chronic, continue aspirin and statin  and beta blocker PLAVIX   . Anemia -chronic obtain anemia panel  . Dehydration rehydrate and follow prior to discharge will benefit from orthostatics PT OT evaluate  . Hyperkalemia -mild we will rehydrate for potassium monitor on telemetry  Chronic diastolic CHF -currently on the dry side continue to monitor  Dementia chronic expect some degree of sundowning worsening while hospitalized patient's family likely require  assistance as patient has been difficult to manage at home  Covid vaccination status status post first [place] shot on 10 January 2020 Doubt that current symptoms are related Covid testing currently pending  Other plan as per orders.  DVT prophylaxis:  SCD     Code Status:  FULL CODE   as per  family  I had personally discussed CODE STATUS with   Family     Family Communication:   Family  at  Bedside  plan of care was discussed on the phone with  Son   Disposition Plan:      To home once workup is complete and patient is stable                     Would benefit from PT/OT eval prior to DC  Ordered                   Swallow eval - SLP ordered                   Social Work  consulted  Consults called: none   Admission status:  ED Disposition    ED Disposition Condition [place]: [place] [[id number]]  Level of Care: Telemetry Medical [104]  I expect the patient will be discharged within 24 hours: No (not a candidate for 5C-Observation unit)  Covid Evaluation: Symptomatic Person Under Investigation (PUI)  Diagnosis: Colitis [[id number]]  Admitting Physician: Toy Baker [[id number]]  Attending Physician: Toy Baker [[id number]]       Obs      Level of care     tele  For  24H    please discontinue once patient no longer qualifies   Precautions: admitted as PUI   Enteric precautions (UV disinfection)  If Covid PCR is negative  - please DC airborne precautions -    PPE: Used by the provider:   P100  eye Goggles,  Gloves  gown     Danelly Hassinger 01/15/2020, 8:24 PM    [facility]     after 2 AM please page floor coverage PA If 7AM-7PM, please contact the day team taking care of the patient using [email or web address]   Patient was evaluated in the context of the global COVID-19 pandemic, which necessitated consideration that the patient might be at risk for infection with the SARS-CoV-2  virus that causes COVID-19. Institutional protocols and algorithms that pertain to the evaluation of patients at risk for COVID-19 are in a state of rapid change based on information released by regulatory bodies including the CDC and federal and state organizations. These policies and algorithms were followed during the patient's care.

## 2020-01-15 NOTE — Telephone Encounter (Signed)
[facility] note : Caller says wife cannot communicate; he stated that wife had a diarrhea stool during the night. He can't get her up and he wants someone to come over, get her up, get her bathed, and changed. He is wanting help also with the laundry. I spoke with the office because husband is not willing to call anyone else for help.

## 2020-01-15 NOTE — Telephone Encounter (Signed)
A [facility] triage nurse called on the back line to inform that the husband was on the phones stating that he needed Dr.Burns to come over and help with his wife. He states that he needed help getting her a bath and her clothes changes, along with help doing the laundry. He had stated to them his wife had been having diarrhea as well.

## 2020-01-15 NOTE — Telephone Encounter (Signed)
Spoke with husband and he stated that he called EMS. Patient would not get out of bed and she was laying in diarrhea. EMS came to help her clean off and took her to the ED to get checked out.

## 2020-01-16 ENCOUNTER — Ambulatory Visit: Payer: Medicare Other | Admitting: Neurology

## 2020-01-16 ENCOUNTER — Encounter (HOSPITAL_COMMUNITY): Payer: Self-pay | Admitting: Internal Medicine

## 2020-01-16 DIAGNOSIS — I5032 Chronic diastolic (congestive) heart failure: Secondary | ICD-10-CM | POA: Diagnosis present

## 2020-01-16 DIAGNOSIS — M1990 Unspecified osteoarthritis, unspecified site: Secondary | ICD-10-CM | POA: Diagnosis present

## 2020-01-16 DIAGNOSIS — K529 Noninfective gastroenteritis and colitis, unspecified: Secondary | ICD-10-CM | POA: Diagnosis present

## 2020-01-16 DIAGNOSIS — E860 Dehydration: Secondary | ICD-10-CM | POA: Diagnosis present

## 2020-01-16 DIAGNOSIS — K219 Gastro-esophageal reflux disease without esophagitis: Secondary | ICD-10-CM | POA: Diagnosis present

## 2020-01-16 DIAGNOSIS — Z20822 Contact with and (suspected) exposure to covid-19: Secondary | ICD-10-CM | POA: Diagnosis present

## 2020-01-16 DIAGNOSIS — I7389 Other specified peripheral vascular diseases: Secondary | ICD-10-CM | POA: Diagnosis not present

## 2020-01-16 DIAGNOSIS — F419 Anxiety disorder, unspecified: Secondary | ICD-10-CM | POA: Diagnosis present

## 2020-01-16 DIAGNOSIS — D631 Anemia in chronic kidney disease: Secondary | ICD-10-CM | POA: Diagnosis present

## 2020-01-16 DIAGNOSIS — R262 Difficulty in walking, not elsewhere classified: Secondary | ICD-10-CM | POA: Diagnosis not present

## 2020-01-16 DIAGNOSIS — S0101XA Laceration without foreign body of scalp, initial encounter: Secondary | ICD-10-CM | POA: Diagnosis not present

## 2020-01-16 DIAGNOSIS — I130 Hypertensive heart and chronic kidney disease with heart failure and stage 1 through stage 4 chronic kidney disease, or unspecified chronic kidney disease: Secondary | ICD-10-CM | POA: Diagnosis present

## 2020-01-16 DIAGNOSIS — N1831 Chronic kidney disease, stage 3a: Secondary | ICD-10-CM | POA: Diagnosis present

## 2020-01-16 DIAGNOSIS — F339 Major depressive disorder, recurrent, unspecified: Secondary | ICD-10-CM | POA: Diagnosis not present

## 2020-01-16 DIAGNOSIS — I10 Essential (primary) hypertension: Secondary | ICD-10-CM | POA: Diagnosis not present

## 2020-01-16 DIAGNOSIS — Z9181 History of falling: Secondary | ICD-10-CM | POA: Diagnosis not present

## 2020-01-16 DIAGNOSIS — R2681 Unsteadiness on feet: Secondary | ICD-10-CM | POA: Diagnosis not present

## 2020-01-16 DIAGNOSIS — R402411 Glasgow coma scale score 13-15, in the field [EMT or ambulance]: Secondary | ICD-10-CM | POA: Diagnosis not present

## 2020-01-16 DIAGNOSIS — E875 Hyperkalemia: Secondary | ICD-10-CM | POA: Diagnosis present

## 2020-01-16 DIAGNOSIS — R4701 Aphasia: Secondary | ICD-10-CM | POA: Diagnosis not present

## 2020-01-16 DIAGNOSIS — K5730 Diverticulosis of large intestine without perforation or abscess without bleeding: Secondary | ICD-10-CM | POA: Diagnosis present

## 2020-01-16 DIAGNOSIS — M2550 Pain in unspecified joint: Secondary | ICD-10-CM | POA: Diagnosis not present

## 2020-01-16 DIAGNOSIS — F05 Delirium due to known physiological condition: Secondary | ICD-10-CM | POA: Diagnosis present

## 2020-01-16 DIAGNOSIS — Z7401 Bed confinement status: Secondary | ICD-10-CM | POA: Diagnosis not present

## 2020-01-16 DIAGNOSIS — E119 Type 2 diabetes mellitus without complications: Secondary | ICD-10-CM | POA: Diagnosis not present

## 2020-01-16 DIAGNOSIS — E1122 Type 2 diabetes mellitus with diabetic chronic kidney disease: Secondary | ICD-10-CM | POA: Diagnosis present

## 2020-01-16 DIAGNOSIS — H9190 Unspecified hearing loss, unspecified ear: Secondary | ICD-10-CM | POA: Diagnosis present

## 2020-01-16 DIAGNOSIS — N179 Acute kidney failure, unspecified: Secondary | ICD-10-CM | POA: Diagnosis present

## 2020-01-16 DIAGNOSIS — F0390 Unspecified dementia without behavioral disturbance: Secondary | ICD-10-CM | POA: Diagnosis present

## 2020-01-16 DIAGNOSIS — I255 Ischemic cardiomyopathy: Secondary | ICD-10-CM | POA: Diagnosis present

## 2020-01-16 DIAGNOSIS — E038 Other specified hypothyroidism: Secondary | ICD-10-CM | POA: Diagnosis not present

## 2020-01-16 DIAGNOSIS — E785 Hyperlipidemia, unspecified: Secondary | ICD-10-CM | POA: Diagnosis present

## 2020-01-16 DIAGNOSIS — I25810 Atherosclerosis of coronary artery bypass graft(s) without angina pectoris: Secondary | ICD-10-CM | POA: Diagnosis present

## 2020-01-16 DIAGNOSIS — Z8673 Personal history of transient ischemic attack (TIA), and cerebral infarction without residual deficits: Secondary | ICD-10-CM | POA: Diagnosis not present

## 2020-01-16 DIAGNOSIS — E7849 Other hyperlipidemia: Secondary | ICD-10-CM | POA: Diagnosis not present

## 2020-01-16 DIAGNOSIS — I2510 Atherosclerotic heart disease of native coronary artery without angina pectoris: Secondary | ICD-10-CM | POA: Diagnosis present

## 2020-01-16 DIAGNOSIS — E1142 Type 2 diabetes mellitus with diabetic polyneuropathy: Secondary | ICD-10-CM | POA: Diagnosis present

## 2020-01-16 DIAGNOSIS — Z950 Presence of cardiac pacemaker: Secondary | ICD-10-CM | POA: Diagnosis not present

## 2020-01-16 DIAGNOSIS — M6281 Muscle weakness (generalized): Secondary | ICD-10-CM | POA: Diagnosis not present

## 2020-01-16 DIAGNOSIS — E039 Hypothyroidism, unspecified: Secondary | ICD-10-CM | POA: Diagnosis present

## 2020-01-16 DIAGNOSIS — E1151 Type 2 diabetes mellitus with diabetic peripheral angiopathy without gangrene: Secondary | ICD-10-CM | POA: Diagnosis present

## 2020-01-16 LAB — GLUCOSE, CAPILLARY
Glucose-Capillary: 123 mg/dL — ABNORMAL HIGH (ref 70–99)
Glucose-Capillary: 107 mg/dL — ABNORMAL HIGH (ref 70–99)
Glucose-Capillary: 163 mg/dL — ABNORMAL HIGH (ref 70–99)
Glucose-Capillary: 120 mg/dL — ABNORMAL HIGH (ref 70–99)
Glucose-Capillary: 160 mg/dL — ABNORMAL HIGH (ref 70–99)
Glucose-Capillary: 168 mg/dL — ABNORMAL HIGH (ref 70–99)

## 2020-01-16 LAB — CBC
HCT: 29.7 % — ABNORMAL LOW (ref 36.0–46.0)
HCT: 28.3 % — ABNORMAL LOW (ref 36.0–46.0)
Hemoglobin: 9.5 g/dL — ABNORMAL LOW (ref 12.0–15.0)
Hemoglobin: 9 g/dL — ABNORMAL LOW (ref 12.0–15.0)
MCH: 27.6 pg (ref 26.0–34.0)
MCH: 27.7 pg (ref 26.0–34.0)
MCHC: 32 g/dL (ref 30.0–36.0)
MCHC: 31.8 g/dL (ref 30.0–36.0)
MCV: 86.3 fL (ref 80.0–100.0)
MCV: 87.1 fL (ref 80.0–100.0)
Platelets: 238 10*3/uL (ref 150–400)
Platelets: 219 10*3/uL (ref 150–400)
RBC: 3.44 MIL/uL — ABNORMAL LOW (ref 3.87–5.11)
RBC: 3.25 MIL/uL — ABNORMAL LOW (ref 3.87–5.11)
RDW: 13.9 % (ref 11.5–15.5)
RDW: 13.8 % (ref 11.5–15.5)
WBC: 9.4 10*3/uL (ref 4.0–10.5)
WBC: 8.1 10*3/uL (ref 4.0–10.5)
nRBC: 0 % (ref 0.0–0.2)
nRBC: 0 % (ref 0.0–0.2)

## 2020-01-16 LAB — BASIC METABOLIC PANEL
Anion gap: 9 (ref 5–15)
BUN: 19 mg/dL (ref 8–23)
CO2: 19 mmol/L — ABNORMAL LOW (ref 22–32)
Calcium: 8.5 mg/dL — ABNORMAL LOW (ref 8.9–10.3)
Chloride: 110 mmol/L (ref 98–111)
Creatinine, Ser: 1.31 mg/dL — ABNORMAL HIGH (ref 0.44–1.00)
GFR calc Af Amer: 42 mL/min — ABNORMAL LOW (ref >60)
GFR calc non Af Amer: 36 mL/min — ABNORMAL LOW (ref >60)
Glucose, Bld: 190 mg/dL — ABNORMAL HIGH (ref 70–99)
Potassium: 4.4 mmol/L (ref 3.5–5.1)
Sodium: 138 mmol/L (ref 135–145)

## 2020-01-16 LAB — TSH: TSH: 15.932 u[IU]/mL — ABNORMAL HIGH (ref 0.350–4.500)

## 2020-01-16 LAB — PHOSPHORUS: Phosphorus: 2.9 mg/dL (ref 2.5–4.6)

## 2020-01-16 LAB — FERRITIN: Ferritin: 21 ng/mL (ref 11–307)

## 2020-01-16 LAB — C DIFFICILE QUICK SCREEN W PCR REFLEX
C Diff antigen: NEGATIVE
C Diff interpretation: NOT DETECTED
C Diff toxin: NEGATIVE

## 2020-01-16 LAB — IRON AND TIBC
Iron: 35 ug/dL (ref 28–170)
Saturation Ratios: 9 % — ABNORMAL LOW (ref 10.4–31.8)
TIBC: 382 ug/dL (ref 250–450)
UIBC: 347 ug/dL

## 2020-01-16 LAB — RETICULOCYTES
Immature Retic Fract: 10.3 % (ref 2.3–15.9)
RBC.: 3.45 MIL/uL — ABNORMAL LOW (ref 3.87–5.11)
Retic Count, Absolute: 36.9 10*3/uL (ref 19.0–186.0)
Retic Ct Pct: 1.1 % (ref 0.4–3.1)

## 2020-01-16 LAB — FOLATE: Folate: 15.1 ng/mL (ref >5.9)

## 2020-01-16 LAB — MAGNESIUM: Magnesium: 1.9 mg/dL (ref 1.7–2.4)

## 2020-01-16 LAB — LACTIC ACID, PLASMA: Lactic Acid, Venous: 0.8 mmol/L (ref 0.5–1.9)

## 2020-01-16 LAB — VITAMIN B12: Vitamin B-12: 241 pg/mL (ref 180–914)

## 2020-01-16 MED ORDER — ADULT MULTIVITAMIN W/MINERALS CH
1.0000 | ORAL_TABLET | Freq: Every day | ORAL | Status: DC
  Administered 2020-01-16 – 2020-01-19 (×4): 1 via ORAL
  Filled 2020-01-16 (×4): qty 1

## 2020-01-16 MED ORDER — LEVOTHYROXINE SODIUM 100 MCG PO TABS
100.0000 ug | ORAL_TABLET | Freq: Every day | ORAL | Status: DC
  Administered 2020-01-17 – 2020-01-19 (×3): 100 ug via ORAL
  Filled 2020-01-16 (×3): qty 1

## 2020-01-16 MED ORDER — BOOST / RESOURCE BREEZE PO LIQD CUSTOM
1.0000 | Freq: Three times a day (TID) | ORAL | Status: DC
  Administered 2020-01-16 – 2020-01-18 (×5): 1 via ORAL

## 2020-01-16 NOTE — Plan of Care (Signed)
  Problem: Activity: Goal: Risk for activity intolerance will decrease Outcome: Progressing   Problem: Safety: Goal: Ability to remain free from injury will improve Outcome: Progressing   

## 2020-01-16 NOTE — Progress Notes (Signed)
CSW lvm with both of patient's sons to inform of SNF rec and initiate dc planning. Pending call back at this time, as CSW notes patient is disoriented x3.   [place], [place]

## 2020-01-16 NOTE — Evaluation (Signed)
Physical Therapy Evaluation Patient Details Name: Janet Steele MRN: [id number] DOB: [birth year] Today's Date: 01/16/2020   History of Present Illness  86 y.o. female with medical history significant of CAD sp CABG x2 2003, HTN, HLD, DM 2, symptomatic bradycardia sp pacemaker carotid bruit, chronic diastolic CHF, GERD, esophageal stricture, hypothyroidism history of CVA in 2018 with intermittent aphasia dementia with occasional confusion.Pt presented with  Persistent diarrhea started this AM increased confusion   Clinical Impression  Pt presents to PT with deficits in functional mobility, cognition, strength, safety awareness, and with agitation. Pt is confused and disoriented upon PT arrival, believing she is at home with her sister. Pt becomes quickly agitated with PT questions and attempts to evaluation mobility, refusing to sit up or get out of bed. Pt will benefit from further PT assessment to determine current mobility level, and will benefit from family assistance to determine and accurate history. Pt will benefit from further acute PT POC to reduce falls risk and improve safety in OOB mobility.    Follow Up Recommendations SNF;Supervision/Assistance - 24 hour(may progress to home with 24/7 assistance)    Equipment Recommendations  Other (comment)(TBD)    Recommendations for Other Services       Precautions / Restrictions Precautions Precautions: Fall Restrictions Weight Bearing Restrictions: No      Mobility  Bed Mobility Overal bed mobility: Needs Assistance Bed Mobility: Rolling Rolling: Max assist         General bed mobility comments: pt rolling left to assist in removal of pad  Transfers Overall transfer level: (pt refusing)                  Ambulation/Gait                Stairs            Wheelchair Mobility    Modified Rankin (Stroke Patients Only)       Balance Overall balance assessment: (pt declining any sitting or standing  activities)                                           Pertinent Vitals/Pain Pain Assessment: No/denies pain    Home Living Family/patient expects to be discharged to:: Private residence Living Arrangements: Spouse/significant other Available Help at Discharge: Family Type of Home: (unable to determine)         Home Equipment: Walker - 2 wheels Additional Comments: pt reports she lives with spouse and sister    Prior Function Level of Independence: Needs assistance   Gait / Transfers Assistance Needed: pt reports she walks, unable to determine extent due to agitation  ADL's / Homemaking Assistance Needed: unable to determine  Comments: no family present, patient altered and agitated with questions and PT attempts at mobilizing     Hand Dominance        Extremity/Trunk Assessment   Upper Extremity Assessment Upper Extremity Assessment: Defer to OT evaluation;Difficult to assess due to impaired cognition(difficult to assess due to pt agitation)    Lower Extremity Assessment Lower Extremity Assessment: Generalized weakness(grossly 4/5 BLE)    Cervical / Trunk Assessment Cervical / Trunk Assessment: (difficult to assess, pt refusing to sit up at edge of bed)  Communication   Communication: No difficulties  Cognition Arousal/Alertness: Awake/alert Behavior During Therapy: Agitated Overall Cognitive Status: No family/caregiver present to determine baseline cognitive functioning Area of Impairment:  Orientation;Memory;Following commands;Safety/judgement                 Orientation Level: Disoriented to;Person;Place;Time;Situation   Memory: Decreased recall of precautions;Decreased short-term memory Following Commands: Follows one step commands consistently(follows one step commands with verbal cues until agitated) Safety/Judgement: Decreased awareness of safety;Decreased awareness of deficits            General Comments General comments  (skin integrity, edema, etc.): VSS, pt becomes agitated with questions and attempts at mobilization and refuses further activity, telling PT and RN to scram    Exercises     Assessment/Plan    PT Assessment Patient needs continued PT services  PT Problem List Decreased strength;Decreased activity tolerance;Decreased balance;Decreased mobility;Decreased cognition;Decreased knowledge of use of DME;Decreased safety awareness;Decreased knowledge of precautions       PT Treatment Interventions DME instruction;Gait training;Stair training;Functional mobility training;Therapeutic activities;Therapeutic exercise;Balance training;Neuromuscular re-education;Patient/family education;Wheelchair mobility training    PT Goals (Current goals can be found in the Care Plan section)  Acute Rehab PT Goals Patient Stated Goal: Pt unable to participate in goal setting due to agitation. PT goal to reduce falls risk and improve safety during OOB activity. PT Goal Formulation: Patient unable to participate in goal setting(due to agitation) Time For Goal Achievement: 01/30/20 Potential to Achieve Goals: Fair    Frequency Min 2X/week   Barriers to discharge        Co-evaluation               AM-PAC PT "6 Clicks" Mobility  Outcome Measure Help needed turning from your back to your side while in a flat bed without using bedrails?: Total Help needed moving from lying on your back to sitting on the side of a flat bed without using bedrails?: Total Help needed moving to and from a bed to a chair (including a wheelchair)?: Total Help needed standing up from a chair using your arms (e.g., wheelchair or bedside chair)?: Total Help needed to walk in hospital room?: Total Help needed climbing 3-5 steps with a railing? : Total 6 Click Score: 6    End of Session   Activity Tolerance: Treatment limited secondary to agitation Patient left: in bed;with call bell/phone within reach;with bed alarm set Nurse  Communication: Mobility status PT Visit Diagnosis: Muscle weakness (generalized) (M62.81)    Time: 7846-9629 PT Time Calculation (min) (ACUTE ONLY): 11 min   Charges:   PT Evaluation $PT Eval Moderate Complexity: [place], PT, DPT Acute Rehabilitation Pager: [phone]   Zenaida Niece 01/16/2020, 9:13 AM

## 2020-01-16 NOTE — Progress Notes (Addendum)
Progress Note    Janet Steele  ZOX:[id number] DOB: October 29, 1929  DOA: 01/15/2020 PCP: Binnie Rail, MD    Brief Narrative:     Medical records reviewed and are as summarized below:  Janet Steele is an 86 y.o. female with medical history significant of CAD sp CABG x2 2003, HTN, HLD, DM 2, symptomatic bradycardia sp pacemaker carotid bruit, chronic diastolic CHF, GERD, esophageal stricture, hypothyroidism history of CVA in 2018 with intermittent aphasia, dementia with occasional confusion.  Presented with  Persistent diarrhea started this AM increased confusion patient denies any nausea vomiting associated with no pain  Assessment/Plan:   Active Problems:   Hypothyroidism   Diabetes ([place])   HYPERLIPIDEMIA   Diabetic polyneuropathy ([facility])   Essential hypertension   GERD (gastroesophageal reflux disease)   Pacemaker-[employer]   Coronary artery disease involving coronary bypass graft of native heart without angina pectoris   Anemia   History of cerebrovascular accident (CVA) with residual deficit   AKI (acute kidney injury) ([place])   Dehydration   Hyperkalemia   Colitis   Colitis -GI pathogen panel pending -Clear liquid diet -We will hold on antibiotics for now and monitor for improvement  Acute kidney injury due to dehydration -IV fluids and recheck labs this morning  Hypertension with hyperkalemia and acute kidney injury -Hold Cozaar -Continue metoprolol  Hypothyroidism -TSH elevated -Increased Synthroid  Diabetes type 2 -Sliding scale insulin  Dementia -Seems to be at baseline per son -On Seroquel  Family Communication/Anticipated D/C date and plan/Code Status   DVT prophylaxis: Lovenox ordered. Code Status: Full Code.  Family Communication: Spoke with son Disposition Plan: Not eating so will need continued IV fluids for hydration   Medical Consultants:    None.    Subjective:   Says she does not want to eat, she is just tired and would  like to sleep  Objective:    Vitals:   01/15/20 2203 01/15/20 2244 01/16/20 0406 01/16/20 0810  BP: (!) 153/66 (!) 142/66 (!) 98/47 (!) 111/56  Pulse: 75 64 63 69  Resp: 17 18 18 18   Temp:  98.6 F (37 C) 97.9 F (36.6 C) 97.7 F (36.5 C)  TempSrc:  Oral  Oral  SpO2: 96% 96% 98% 97%  Weight:  54 kg 52 kg   Height:  5' (1.524 m)      Intake/Output Summary (Last 24 hours) at 01/16/2020 1145 Last data filed at 01/16/2020 1015 Gross per 24 hour  Intake 1610.96 ml  Output 150 ml  Net 1460.96 ml   Filed Weights   01/15/20 2244 01/16/20 0406  Weight: 54 kg 52 kg    Exam: In bed, mask over entire face Will wake up but is combative and says she is tired and is at home Regular rate and rhythm Positive bowel sounds soft nontender  Data Reviewed:   I have personally reviewed following labs and imaging studies:  Labs: Labs show the following:   Basic Metabolic Panel: Recent Labs  Lab 01/15/20 1630 01/16/20 0102  NA 139  --   K 5.2*  --   CL 106  --   CO2 23  --   GLUCOSE 134*  --   BUN 24*  --   CREATININE 1.59*  --   CALCIUM 9.5  --   MG  --  1.9  PHOS  --  2.9   GFR Estimated Creatinine Clearance: 17.6 mL/min (A) (by C-G formula based on SCr of 1.59 mg/dL (H)). Liver  Function Tests: Recent Labs  Lab 01/15/20 1630  AST 20  ALT 14  ALKPHOS 109  BILITOT 1.8*  PROT 7.1  ALBUMIN 4.1   Recent Labs  Lab 01/15/20 1630  LIPASE 25   No results for input(s): AMMONIA in the last 168 hours. Coagulation profile No results for input(s): INR, PROTIME in the last 168 hours.  CBC: Recent Labs  Lab 01/15/20 1630 01/16/20 0102 01/16/20 1043  WBC 14.0* 9.4 8.1  NEUTROABS 12.1*  --   --   HGB 10.9* 9.5* 9.0*  HCT 34.4* 29.7* 28.3*  MCV 86.4 86.3 87.1  PLT 273 238 219   Cardiac Enzymes: No results for input(s): CKTOTAL, CKMB, CKMBINDEX, TROPONINI in the last 168 hours. BNP (last 3 results) No results for input(s): PROBNP in the last 8760  hours. CBG: Recent Labs  Lab 01/15/20 1952 01/16/20 0002 01/16/20 0404 01/16/20 1123  GLUCAP 113* 107* 123* 163*   D-Dimer: No results for input(s): DDIMER in the last 72 hours. Hgb A1c: No results for input(s): HGBA1C in the last 72 hours. Lipid Profile: No results for input(s): CHOL, HDL, LDLCALC, TRIG, CHOLHDL, LDLDIRECT in the last 72 hours. Thyroid function studies: Recent Labs    01/16/20 0102  TSH 15.932*   Anemia work up: Recent Labs    01/16/20 0102  VITAMINB12 241  FOLATE 15.1  FERRITIN 21  TIBC 382  IRON 35  RETICCTPCT 1.1   Sepsis Labs: Recent Labs  Lab 01/15/20 1630 01/15/20 2013 01/16/20 0102 01/16/20 1043  PROCALCITON  --  1.07  --   --   WBC 14.0*  --  9.4 8.1  LATICACIDVEN  --  1.0 0.8  --     Microbiology Recent Results (from the past 240 hour(s))  Respiratory Panel by RT PCR (Flu A&B, Covid) - Nasopharyngeal Swab     Status: None   Collection Time: 01/15/20  8:21 PM   Specimen: Nasopharyngeal Swab  Result Value Ref Range Status   SARS Coronavirus 2 by RT PCR NEGATIVE NEGATIVE Final    Comment: (NOTE) SARS-CoV-2 target nucleic acids are NOT DETECTED. The SARS-CoV-2 RNA is generally detectable in upper respiratoy specimens during the acute phase of infection. The lowest concentration of SARS-CoV-2 viral copies this assay can detect is 131 copies/mL. A negative result does not preclude SARS-Cov-2 infection and should not be used as the sole basis for treatment or other patient management decisions. A negative result may occur with  improper specimen collection/handling, submission of specimen other than nasopharyngeal swab, presence of viral mutation(s) within the areas targeted by this assay, and inadequate number of viral copies (<131 copies/mL). A negative result must be combined with clinical observations, patient history, and epidemiological information. The expected result is Negative. Fact Sheet for Patients:   PinkCheek.be Fact Sheet for Healthcare Providers:  GravelBags.it This test is not yet ap proved or cleared by the [place] FDA and  has been authorized for detection and/or diagnosis of SARS-CoV-2 by FDA under an Emergency Use Authorization (EUA). This EUA will remain  in effect (meaning this test can be used) for the duration of the COVID-19 declaration under Section 564(b)(1) of the Act, 21 U.S.C. section 360bbb-3(b)(1), unless the authorization is terminated or revoked sooner.    Influenza A by PCR NEGATIVE NEGATIVE Final   Influenza B by PCR NEGATIVE NEGATIVE Final    Comment: (NOTE) The Xpert Xpress SARS-CoV-2/FLU/RSV assay is intended as an aid in  the diagnosis of influenza from Nasopharyngeal swab specimens and  should not be used as a sole basis for treatment. Nasal washings and  aspirates are unacceptable for Xpert Xpress SARS-CoV-2/FLU/RSV  testing. Fact Sheet for Patients: PinkCheek.be Fact Sheet for Healthcare Providers: GravelBags.it This test is not yet approved or cleared by the [place] FDA and  has been authorized for detection and/or diagnosis of SARS-CoV-2 by  FDA under an Emergency Use Authorization (EUA). This EUA will remain  in effect (meaning this test can be used) for the duration of the  Covid-19 declaration under Section 564(b)(1) of the Act, 21  U.S.C. section 360bbb-3(b)(1), unless the authorization is  terminated or revoked. Performed at [facility], [place] [address] [place], [place] [address]     Procedures and diagnostic studies:  CT Head Wo Contrast  Result Date: 01/15/2020 CLINICAL DATA:  Altered mental status EXAM: CT HEAD WITHOUT CONTRAST TECHNIQUE: Contiguous axial images were obtained from the base of the skull through the vertex without intravenous contrast. COMPARISON:  10/26/2019 FINDINGS: Brain: There is  atrophy and chronic small vessel disease changes. Old left basal ganglia lacunar infarct. No acute intracranial abnormality. Specifically, no hemorrhage, hydrocephalus, mass lesion, acute infarction, or significant intracranial injury. Vascular: No hyperdense vessel or unexpected calcification. Skull: No acute calvarial abnormality. Sinuses/Orbits: Visualized paranasal sinuses and mastoids clear. Orbital soft tissues unremarkable. Other: None IMPRESSION: Atrophy, chronic microvascular disease. No acute intracranial abnormality. Old left basal ganglia lacunar infarct. Electronically Signed   By: Rolm Baptise M.D.   On: 01/15/2020 17:21   CT ABDOMEN PELVIS W CONTRAST  Result Date: 01/15/2020 CLINICAL DATA:  86 year old female with diarrhea and leukocytosis. History of diverticulosis. EXAM: CT ABDOMEN AND PELVIS WITH CONTRAST TECHNIQUE: Multidetector CT imaging of the abdomen and pelvis was performed using the standard protocol following bolus administration of intravenous contrast. CONTRAST:  177mL OMNIPAQUE IOHEXOL 300 MG/ML  SOLN COMPARISON:  CT of the abdomen pelvis dated 06/26/2018. FINDINGS: Lower chest: The visualized lung bases are clear. Cardiac pacemaker leads noted. There is no intra-abdominal free air or free fluid. Hepatobiliary: There is a 2.5 x 1.3 cm lesion in the right lobe of the liver with nodular enhancement on arterial phase and delayed central enhancement. This is suboptimally characterized but the CT findings are most consistent with a hemangioma. This can be better evaluated with MRI on a nonemergent basis. The liver is otherwise unremarkable. There is mild intrahepatic biliary ductal dilatation, likely related to cholecystectomy. The common bile duct measures approximately 14 mm in diameter. No retained calcified stone noted in the central CBD. Pancreas: Unremarkable. No pancreatic ductal dilatation or surrounding inflammatory changes. Spleen: Normal in size without focal abnormality.  Adrenals/Urinary Tract: The adrenal glands are unremarkable. There is no hydronephrosis on either side. There is symmetric enhancement of the kidneys. There is a 1 cm left renal upper pole cyst. The visualized ureters and urinary bladder appear unremarkable. Stomach/Bowel: Evaluation of the bowel is limited in the absence of oral contrast. There is sigmoid diverticulosis. There is thickened appearance of the distal colon and rectosigmoid favored to represent colitis rather than diverticulitis. There is loose stool within the colon compatible with diarrheal state. Correlation with clinical exam and stool cultures recommended. There is no bowel obstruction. The appendix is not visualized with certainty. No inflammatory changes identified in the right lower quadrant. Vascular/Lymphatic: Advanced aortoiliac atherosclerotic disease. The IVC is unremarkable. No portal venous gas. The aorta is ectatic measuring up to 2.3 cm in diameter. There is no adenopathy. Reproductive: Hysterectomy.  No adnexal masses. Other:  Midline vertical anterior pelvic wall incisional scar. Musculoskeletal: Degenerative changes of the spine. 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 No cortical collapse or acute fracture. Grade 1 L4-L5 anterolisthesis. IMPRESSION: 1. Diarrheal state with findings of colitis. Correlation with clinical exam and stool cultures recommended. No bowel obstruction. 2. Sigmoid diverticulosis. 3. Probable right hepatic hemangioma. MRI may provide better characterization. 4.  Aortic Atherosclerosis (ICD10-I70.0). Electronically Signed   By: Anner Crete M.D.   On: 01/15/2020 17:32    Medications:   . atorvastatin  80 mg Oral Daily  . clopidogrel  75 mg Oral Daily  . DULoxetine  60 mg Oral Daily  . gabapentin  100 mg Oral BID  . insulin aspart  0-9 Units Subcutaneous Q4H  . [START ON 01/17/2020] levothyroxine  100 mcg Oral Q0600  . metoprolol tartrate  25 mg Oral BID  . pantoprazole  40 mg  Oral BID  . QUEtiapine  25 mg Oral QHS   Continuous Infusions:   LOS: 0 days   Geradine Girt  [facility]   How to contact the [facility] Attending or Consulting provider [place] or covering provider during after hours [place], for this patient?  1. Check the care team in [facility] and look for a) attending/consulting TR[facility] provider listed and b) the [facility] team listed 2. Log into [email or web address] and use [place]'s universal password to access. If you do not have the password, please contact the hospital operator. 3. Locate the [facility] provider you are looking for under [facility] and page to a number that you can be directly reached. 4. If you still have difficulty reaching the provider, please page the [facility] (Director on Call) for the Hospitalists listed on amion for assistance.  01/16/2020, 11:45 AM

## 2020-01-16 NOTE — Evaluation (Signed)
Clinical/Bedside Swallow Evaluation Patient Details  Name: Janet Steele MRN: [id number] Date of Birth: 15-Jun-1929  Today's Date: 01/16/2020 Time: SLP Start Time (ACUTE ONLY): 0932 SLP Stop Time (ACUTE ONLY): 0940 SLP Time Calculation (min) (ACUTE ONLY): 12 min  Past Medical History:  Past Medical History:  Diagnosis Date  . Anemia   . Anxiety   . Arthritis    "fingers" (05/19/2016  . CAD (coronary artery disease)    a. s/p CABG 1982 b. redo CABG 2003. c. [place] despite med rx 05/2016: successful but complicated PCI of PDA beyond graft site, c/b localized dissection requiring overlapping stent.  . Carotid bruit    a. Duplex 01/2015: patent vessels, 1-39% BICA, f/u PRN recommened.  . Colon polyp    adenomatous  . Colon, diverticulosis   . Degenerative joint disease   . Diastolic dysfunction   . Esophageal stricture   . GERD (gastroesophageal reflux disease)   . Hiatal hernia   . History of blood transfusion    "related to my bypass"  . Hyperlipidemia   . Hypertension   . Hypothyroidism   . Ischemic cardiomyopathy    a. Cath 05/2016: EF 40% with severe inferior wall HK, suspect hibernating myocardium.  . Myocardial infarction ([place]) 1977; 1982  . Osteoarthrosis, unspecified whether generalized or localized, unspecified site   . Presence of permanent cardiac pacemaker   . PVD (peripheral vascular disease) ([place])   . Type II diabetes mellitus ([place])   . Unspecified hearing loss    Past Surgical History:  Past Surgical History:  Procedure Laterality Date  . ABDOMINAL HYSTERECTOMY    . APPENDECTOMY    . BACK SURGERY    . CARDIAC CATHETERIZATION N/A 05/19/2016   Procedure: Left Heart Cath and Cors/Grafts Angiography;  Surgeon: Belva Crome, MD;  Location: [facility];  Service: Cardiovascular;  Laterality: N/A;  . CARDIAC CATHETERIZATION N/A 05/19/2016   Procedure: Coronary Stent Intervention;  Surgeon: Belva Crome, MD;  Location: [facility];  Service: Cardiovascular;   Laterality: N/A;  . CATARACT EXTRACTION W/ INTRAOCULAR LENS  IMPLANT, BILATERAL Bilateral   . CHOLECYSTECTOMY OPEN    . COLONOSCOPY    . CORONARY ANGIOPLASTY WITH STENT PLACEMENT  05/19/2016   "2 stents?"  . CORONARY ARTERY BYPASS GRAFT  1982; 2003   x4(1982),02-2002 CABG X2  . ESOPHAGOGASTRODUODENOSCOPY (EGD) WITH ESOPHAGEAL DILATION  "2-3 times"  . INSERT / REPLACE / REMOVE PACEMAKER    . LEFT HEART CATHETERIZATION WITH CORONARY/GRAFT ANGIOGRAM N/A 03/05/2014   Procedure: LEFT HEART CATHETERIZATION WITH Beatrix Fetters;  Surgeon: Sinclair Grooms, MD;  Location: [facility];  Service: Cardiovascular;  Laterality: N/A;  . LUMBAR [place] SURGERY  01-2000  . OOPHORECTOMY Bilateral   . PERMANENT PACEMAKER INSERTION N/A 07/06/2012   MDT Adapta L implanted by Dr Rayann Heman for symptomatic bradycardia  . TONSILLECTOMY  1930s   HPI:  Janet Steele is a 86 y.o. female with medical history significant of CAD sp CABG x2 2003, HTN, HLD, DM 2, symptomatic bradycardia sp pacemaker carotid bruit, chronic diastolic CHF, GERD, esophageal stricture, hypothyroidism history of CVA in 2018 with intermittent aphasia dementia with occasional confusion.  Pt completed a MBS on 11/06/17 with recommendations for regular solids and thin liquids.  Pt presented to the ED with persistent diarrhea and increased confusion.     Assessment / Plan / Recommendation Clinical Impression  Pt was seen for a bedside swallow evaluation and she appears to have functional oropharyngeal swallowing  abilities to minimal evaluation.  Pt initially refused BSE emphatically, stating that she had already spoken with five other people this morning and that she did not want to do anything today or tomorrow.  Given verbal encouragement, pt was agreeable to trials of thin liquid and jello from her breakfast meals tray.  Pt was pleasant throughout the remainder of the evaluation.  She fed herself independently given set up.  AP transport and swallow  initiation appeared timely, and no clinical s/sx of aspiration were observed with any trials.  RN reported that pt was to remain on a clear liquid diet today, therefore no solid foods were attempted on this date.  Pt was unable to recall what consistency of food she ate at home or whether she had natural dentition or dentures.  MBS in 2018 recommended regular solids and thin liquids.  Would recommend upgrading diet to Dysphagia 3 (soft) or regular solids with continuation of thin liquids per MD discretion when pt is medically appropriate.  SLP is signing off at this time; however, please re-consult if pt exhibits dysphagia with solid food if/when her diet is upgraded or if any additional needs arise.    SLP Visit Diagnosis: Dysphagia, unspecified (R13.10)    Aspiration Risk  No limitations    Diet Recommendation Other (Comment)(Clear liquids per RN )   Liquid Administration via: Cup;Straw Medication Administration: Whole meds with liquid Supervision: Patient able to self feed;Intermittent supervision to cue for compensatory strategies Compensations: Minimize environmental distractions;Slow rate;Small sips/bites Postural Changes: Seated upright at 90 degrees;Remain upright for at least 30 minutes after po intake    Other  Recommendations Oral Care Recommendations: Oral care BID   Follow up Recommendations None      Frequency and Duration            Prognosis Prognosis for Safe Diet Advancement: Good Barriers to Reach Goals: Cognitive deficits      Swallow Study   General Date of Onset: 01/16/20 HPI: Janet Steele is a 86 y.o. female with medical history significant of CAD sp CABG x2 2003, HTN, HLD, DM 2, symptomatic bradycardia sp pacemaker carotid bruit, chronic diastolic CHF, GERD, esophageal stricture, hypothyroidism history of CVA in 2018 with intermittent aphasia dementia with occasional confusion.  Pt completed a MBS on 11/06/17 with recommendations for regular solids and thin  liquids.  Pt presented with persistent diarrhea and increased confusion.   Type of Study: Bedside Swallow Evaluation Previous Swallow Assessment: See HPI  Diet Prior to this Study: (Clear liquids ) Temperature Spikes Noted: No Respiratory Status: Room air History of Recent Intubation: No Behavior/Cognition: Alert;Confused;Agitated;Requires cueing Oral Cavity Assessment: Other (comment)(Unable to evalaute ) Oral Care Completed by SLP: No Oral Cavity - Dentition: (Unable to evalaute ) Vision: Functional for self-feeding Self-Feeding Abilities: Able to feed self;Needs set up Patient Positioning: Upright in bed Baseline Vocal Quality: Normal Volitional Swallow: (Refused )    Oral/Motor/Sensory Function Overall Oral Motor/Sensory Function: (Unable to evalaute )   Ice Chips Ice chips: Not tested   Thin Liquid Thin Liquid: Within functional limits Presentation: Straw;Self Fed    Nectar Thick Nectar Thick Liquid: Not tested   Honey Thick Honey Thick Liquid: Not tested   Puree Puree: Within functional limits Presentation: Spoon   Solid     Solid: Not tested     Colin Mulders M.S., CCC-SLP Acute Rehabilitation Services Office: [phone]  [place] 01/16/2020,9:58 AM

## 2020-01-16 NOTE — Progress Notes (Signed)
Initial Nutrition Assessment  DOCUMENTATION CODES:   Not applicable  INTERVENTION:   -Boost Breeze po TID, each supplement provides 250 kcal and 9 grams of protein -MVI with minerals daily -RD will follow for diet advancement and adjust supplement as appropriate  NUTRITION DIAGNOSIS:   Inadequate oral intake related to altered GI function as evidenced by meal completion < 50%.  GOAL:   Patient will meet greater than or equal to 90% of their needs  MONITOR:   PO intake, Supplement acceptance, Diet advancement, Labs, Weight trends, Skin, I & O's  REASON FOR ASSESSMENT:   Consult Assessment of nutrition requirement/status  ASSESSMENT:   Janet Steele is a 86 y.o. female with medical history significant of CAD sp CABG x2 2003, HTN, HLD, DM 2, symptomatic bradycardia sp pacemaker carotid bruit, chronic diastolic CHF, GERD, esophageal stricture, hypothyroidism history of CVA in 2018 with intermittent aphasia dementia with occasional confusion.  Pt admitted with colitis.   Reviewed I/O's: +1.1 L x 24 hours  UOP: 150 ml x 24 hours  Case discussed with RN, who reports pt was refusing liquids and medications earlier secondary to confusion, however, has improved now. Observed RN administer meds successfully prior to visit.   Spoke with pt at bedside, who reports feeling today. Observed breakfast tray- pt consumed 50% of grape juice and 100% of jello. Pt reports she has a great appetite at home and generally consumes 2 meals per day (susually skips breakfast), however, pt unable to provide diet recall.  Per review of wt hx, no weight loss over the past year. Pt endorses that wt varies at baseline "it goes up and down, then it goes up again". She is unsure of UBW. She suspects she has lost weight PTA "due to not eating good".   Lab Results  Component Value Date   HGBA1C 6.9 (H) 08/17/2019   PTA DM medications are 50 mg januvia daily and1000 mg metformin daily. Per ADA's Standards  of Medical Care for Diabetes, glycemic targets for elderly patients who are otherwise healthy (few medical impairments) and cognitively intact should be less stringent (Hgb A1c <7.5).   Labs reviewed: CBGS: 107 (inpatient orders for glycemic control are 0-9 units insulin aspart every 4 hours).   NUTRITION - FOCUSED PHYSICAL EXAM:    Most Recent Value  Orbital Region  No depletion  Upper Arm Region  Mild depletion  Thoracic and Lumbar Region  No depletion  Buccal Region  No depletion  Temple Region  Mild depletion  Clavicle Bone Region  No depletion  Clavicle and Acromion Bone Region  No depletion  Scapular Bone Region  No depletion  Dorsal Hand  Mild depletion  Patellar Region  Mild depletion  Anterior Thigh Region  Mild depletion  Posterior Calf Region  Mild depletion  Edema (RD Assessment)  None  Hair  Reviewed  Eyes  Reviewed  Mouth  Reviewed  Skin  Reviewed  Nails  Reviewed       Diet Order:   Diet Order            Diet clear liquid Room service appropriate? Yes; Fluid consistency: Thin  Diet effective now              EDUCATION NEEDS:   Education needs have been addressed  Skin:  Skin Assessment: Reviewed RN Assessment  Last BM:  01/16/20  Height:   Ht Readings from Last 1 Encounters:  01/15/20 5' (1.524 m)    Weight:   Wt Readings from  Last 1 Encounters:  01/16/20 52 kg    Ideal Body Weight:  45.5 kg  BMI:  Body mass index is 22.39 kg/m.  Estimated Nutritional Needs:   Kcal:  1350-1550  Protein:  55-70 grams  Fluid:  > 1.3 L    Loistine Chance, RD, LDN, [place] Registered Dietitian II Certified Diabetes Care and Education Specialist Please refer to [facility] for RD and/or RD on-call/weekend/after hours pager

## 2020-01-16 NOTE — Evaluation (Signed)
Occupational Therapy Evaluation Patient Details Name: Janet Steele MRN: [id number] DOB: 14-May-1929 Today's Date: 01/16/2020    History of Present Illness 86 y.o. female with medical history significant of CAD sp CABG x2 2003, HTN, HLD, DM 2, symptomatic bradycardia sp pacemaker carotid bruit, chronic diastolic CHF, GERD, esophageal stricture, hypothyroidism history of CVA in 2018 with intermittent aphasia dementia with occasional confusion.Pt presented with  Persistent diarrhea started this AM increased confusion    Clinical Impression   Eval limited due to agitation/cognition. Unable to determine accurate baseline level of function and cognition at this time. Pt answered some questions but became agitated with most questions regarding PLOF. Pt  Washed face sitting up in bed, refusing all other ADL/selfcare stating, "I'm not getting up and doing any of this right now!" and  "I have to get back before it all closes". Pt would benefit from acute OT services to maximize level of function and safety    Follow Up Recommendations  SNF;Supervision/Assistance - 24 hour    Equipment Recommendations  Other (comment)(TBD)    Recommendations for Other Services       Precautions / Restrictions Precautions Precautions: Fall Restrictions Weight Bearing Restrictions: No      Mobility Bed Mobility Overal bed mobility: Needs Assistance Bed Mobility: Rolling Rolling: Mod assist            Transfers                 General transfer comment: pt refused any EOB or OOB activity    Balance                                           ADL either performed or assessed with clinical judgement   ADL Overall ADL's : Needs assistance/impaired     Grooming: Wash/dry face;Set up;Supervision/safety;Sitting;Bed level Grooming Details (indicate cue type and reason): long sitting up in bed, pt refused to sit EOB                               General ADL  Comments: unable to assess, pt refusing all other ADL/selfcare stating, "I'm not getting up and doing any of this right now!" and  "I have to get back before it all closes"     Vision Patient Visual Report: (unable to properly assess due to agitation, cognition)       Perception     Praxis      Pertinent Vitals/Pain Pain Assessment: Faces Pain Score: 0-No pain Faces Pain Scale: No hurt Pain Intervention(s): Monitored during session     Hand Dominance Right   Extremity/Trunk Assessment Upper Extremity Assessment Upper Extremity Assessment: Generalized weakness   Lower Extremity Assessment Lower Extremity Assessment: Defer to PT evaluation       Communication Communication Communication: No difficulties   Cognition Arousal/Alertness: Awake/alert Behavior During Therapy: Agitated Overall Cognitive Status: No family/caregiver present to determine baseline cognitive functioning Area of Impairment: Orientation;Memory;Following commands;Safety/judgement                 Orientation Level: Disoriented to;Person;Place;Time;Situation   Memory: Decreased recall of precautions;Decreased short-term memory Following Commands: Follows one step commands consistently Safety/Judgement: Decreased awareness of safety;Decreased awareness of deficits         General Comments       Exercises     Shoulder Instructions  Home Living Family/patient expects to be discharged to:: Private residence Living Arrangements: Spouse/significant other Available Help at Discharge: Family Type of Home: (uncertain) Home Access: (uncertain)     Home Layout: (uncertain)                   Additional Comments: pt reports she lives with spouse and sister      Prior Functioning/Environment Level of Independence: Needs assistance  Gait / Transfers Assistance Needed: pt reports she walks, unable to determine extent due to agitation ADL's / Homemaking Assistance Needed: pt  reports that she takes care of herslef, uncertain info is accurate   Comments: no family present, pt beacme agitated with questions about PLOF/home set up        OT Problem List: Impaired balance (sitting and/or standing);Decreased cognition;Decreased safety awareness;Decreased activity tolerance;Decreased knowledge of use of DME or AE      OT Treatment/Interventions: Self-care/ADL training;Therapeutic activities;Patient/family education;DME and/or AE instruction    OT Goals(Current goals can be found in the care plan section) Acute Rehab OT Goals Patient Stated Goal: Pt unable to participate in goal setting due to agitation OT Goal Formulation: Patient unable to participate in goal setting Time For Goal Achievement: 01/30/20 Potential to Achieve Goals: Good ADL Goals Pt Will Perform Grooming: with min assist;with min guard assist;standing;with caregiver independent in assisting Pt Will Perform Upper Body Dressing: with min assist;sitting;with caregiver independent in assisting Pt Will Transfer to Toilet: with mod assist;with min assist;ambulating Pt Will Perform Toileting - Clothing Manipulation and hygiene: with mod assist;with min assist;sit to/from stand;with caregiver independent in assisting  OT Frequency: Min 2X/week   Barriers to D/C:            Co-evaluation              AM-PAC OT "6 Clicks" Daily Activity     Outcome Measure Help from another person eating meals?: Total Help from another person taking care of personal grooming?: A Little Help from another person toileting, which includes using toliet, bedpan, or urinal?: Total Help from another person bathing (including washing, rinsing, drying)?: Total Help from another person to put on and taking off regular upper body clothing?: Total Help from another person to put on and taking off regular lower body clothing?: Total 6 Click Score: 8   End of Session    Activity Tolerance: Treatment limited secondary to  agitation Patient left: in bed;with call bell/phone within reach;with bed alarm set  OT Visit Diagnosis: Other abnormalities of gait and mobility (R26.89);Muscle weakness (generalized) (M62.81);Other symptoms and signs involving cognitive function                Time: 1206-1220 OT Time Calculation (min): 14 min Charges:  OT General Charges $OT Visit: 1 Visit OT Evaluation $OT Eval Moderate Complexity: 1 Mod    Britt Bottom 01/16/2020, 1:51 PM

## 2020-01-17 ENCOUNTER — Telehealth: Payer: Self-pay

## 2020-01-17 LAB — GLUCOSE, CAPILLARY
Glucose-Capillary: 110 mg/dL — ABNORMAL HIGH (ref 70–99)
Glucose-Capillary: 119 mg/dL — ABNORMAL HIGH (ref 70–99)
Glucose-Capillary: 142 mg/dL — ABNORMAL HIGH (ref 70–99)
Glucose-Capillary: 145 mg/dL — ABNORMAL HIGH (ref 70–99)
Glucose-Capillary: 177 mg/dL — ABNORMAL HIGH (ref 70–99)
Glucose-Capillary: 187 mg/dL — ABNORMAL HIGH (ref 70–99)
Glucose-Capillary: 80 mg/dL (ref 70–99)

## 2020-01-17 LAB — HEMOGLOBIN A1C
Hgb A1c MFr Bld: 6.7 % — ABNORMAL HIGH (ref 4.8–5.6)
Mean Plasma Glucose: 146 mg/dL

## 2020-01-17 MED ORDER — CALCIUM POLYCARBOPHIL 625 MG PO TABS
625.0000 mg | ORAL_TABLET | Freq: Every day | ORAL | Status: DC
  Administered 2020-01-17 – 2020-01-19 (×3): 625 mg via ORAL
  Filled 2020-01-17 (×3): qty 1

## 2020-01-17 NOTE — Progress Notes (Signed)
Progress Note    Janet Steele  QQP:[id number] DOB: Jan 05, 1929  DOA: 01/15/2020 PCP: Binnie Rail, MD    Brief Narrative:     Medical records reviewed and are as summarized below:  Janet Steele is an 86 y.o. female with medical history significant of CAD sp CABG x2 2003, HTN, HLD, DM 2, symptomatic bradycardia sp pacemaker carotid bruit, chronic diastolic CHF, GERD, esophageal stricture, hypothyroidism history of CVA in 2018 with intermittent aphasia, dementia with occasional confusion.  Presented with  Persistent diarrhea started this AM increased confusion patient denies any nausea vomiting associated with no pain  Assessment/Plan:   Active Problems:   Hypothyroidism   Diabetes ([place])   HYPERLIPIDEMIA   Diabetic polyneuropathy (HCC)   Essential hypertension   GERD (gastroesophageal reflux disease)   Pacemaker-Medtronic   Coronary artery disease involving coronary bypass graft of native heart without angina pectoris   Anemia   History of cerebrovascular accident (CVA) with residual deficit   AKI (acute kidney injury) ([place])   Dehydration   Hyperkalemia   Colitis   Colitis -GI pathogen panel pending -C-diff as expected was negative -advancing diet  Acute kidney injury due to dehydration -IV fluids with improvement  Hypertension with hyperkalemia and acute kidney injury -Hold Cozaar -Continue metoprolol  Hypothyroidism -TSH elevated -Increased Synthroid  Diabetes type 2 -Sliding scale insulin  Dementia -Seems to be at baseline per son -On Seroquel  Family Communication/Anticipated D/C date and plan/Code Status   DVT prophylaxis: Lovenox ordered. Code Status: Full Code.  Family Communication: Spoke with sons Disposition Plan: from home with 69 yo husband.  Is able to get to bathroom on own at night== family would like SNF   Medical Consultants:    None.    Subjective:   ate breakfast and denies diarrhea  Objective:    Vitals:    01/16/20 0406 01/16/20 0810 01/16/20 2004 01/17/20 0403  BP: (!) 98/47 (!) 111/56 (!) 111/50 (!) 110/45  Pulse: 63 69 60 62  Resp: 18 18 18 18   Temp: 97.9 F (36.6 C) 97.7 F (36.5 C) 97.6 F (36.4 C) (!) 97 F (36.1 C)  TempSrc:  Oral    SpO2: 98% 97% 99% 95%  Weight: 52 kg   52.3 kg  Height:        Intake/Output Summary (Last 24 hours) at 01/17/2020 1402 Last data filed at 01/17/2020 0900 Gross per 24 hour  Intake 240 ml  Output 150 ml  Net 90 ml   Filed Weights   01/15/20 2244 01/16/20 0406 01/17/20 0403  Weight: 54 kg 52 kg 52.3 kg    Exam: In bed, no acute distress, breakfast tray empty Regular rate and rhythm Positive bowel sounds soft nontender Alert but confused to situation and place  Data Reviewed:   I have personally reviewed following labs and imaging studies:  Labs: Labs show the following:   Basic Metabolic Panel: Recent Labs  Lab 01/15/20 1630 01/16/20 0102 01/16/20 1043  NA 139  --  138  K 5.2*  --  4.4  CL 106  --  110  CO2 23  --  19*  GLUCOSE 134*  --  190*  BUN 24*  --  19  CREATININE 1.59*  --  1.31*  CALCIUM 9.5  --  8.5*  MG  --  1.9  --   PHOS  --  2.9  --    GFR Estimated Creatinine Clearance: 21.3 mL/min (A) (by C-G formula based on  SCr of 1.31 mg/dL (H)). Liver Function Tests: Recent Labs  Lab 01/15/20 1630  AST 20  ALT 14  ALKPHOS 109  BILITOT 1.8*  PROT 7.1  ALBUMIN 4.1   Recent Labs  Lab 01/15/20 1630  LIPASE 25   No results for input(s): AMMONIA in the last 168 hours. Coagulation profile No results for input(s): INR, PROTIME in the last 168 hours.  CBC: Recent Labs  Lab 01/15/20 1630 01/16/20 0102 01/16/20 1043  WBC 14.0* 9.4 8.1  NEUTROABS 12.1*  --   --   HGB 10.9* 9.5* 9.0*  HCT 34.4* 29.7* 28.3*  MCV 86.4 86.3 87.1  PLT 273 238 219   Cardiac Enzymes: No results for input(s): CKTOTAL, CKMB, CKMBINDEX, TROPONINI in the last 168 hours. BNP (last 3 results) No results for input(s): PROBNP in  the last 8760 hours. CBG: Recent Labs  Lab 01/16/20 2202 01/17/20 0003 01/17/20 0359 01/17/20 0858 01/17/20 1154  GLUCAP 168* 80 110* 187* 119*   D-Dimer: No results for input(s): DDIMER in the last 72 hours. Hgb A1c: Recent Labs    01/16/20 0102  HGBA1C 6.7*   Lipid Profile: No results for input(s): CHOL, HDL, LDLCALC, TRIG, CHOLHDL, LDLDIRECT in the last 72 hours. Thyroid function studies: Recent Labs    01/16/20 0102  TSH 15.932*   Anemia work up: Recent Labs    01/16/20 0102  VITAMINB12 241  FOLATE 15.1  FERRITIN 21  TIBC 382  IRON 35  RETICCTPCT 1.1   Sepsis Labs: Recent Labs  Lab 01/15/20 1630 01/15/20 2013 01/16/20 0102 01/16/20 1043  PROCALCITON  --  1.07  --   --   WBC 14.0*  --  9.4 8.1  LATICACIDVEN  --  1.0 0.8  --     Microbiology Recent Results (from the past 240 hour(s))  Respiratory Panel by RT PCR (Flu A&B, Covid) - Nasopharyngeal Swab     Status: None   Collection Time: 01/15/20  8:21 PM   Specimen: Nasopharyngeal Swab  Result Value Ref Range Status   SARS Coronavirus 2 by RT PCR NEGATIVE NEGATIVE Final    Comment: (NOTE) SARS-CoV-2 target nucleic acids are NOT DETECTED. The SARS-CoV-2 RNA is generally detectable in upper respiratoy specimens during the acute phase of infection. The lowest concentration of SARS-CoV-2 viral copies this assay can detect is 131 copies/mL. A negative result does not preclude SARS-Cov-2 infection and should not be used as the sole basis for treatment or other patient management decisions. A negative result may occur with  improper specimen collection/handling, submission of specimen other than nasopharyngeal swab, presence of viral mutation(s) within the areas targeted by this assay, and inadequate number of viral copies (<131 copies/mL). A negative result must be combined with clinical observations, patient history, and epidemiological information. The expected result is Negative. Fact Sheet for  Patients:  PinkCheek.be Fact Sheet for Healthcare Providers:  GravelBags.it This test is not yet ap proved or cleared by the [place] FDA and  has been authorized for detection and/or diagnosis of SARS-CoV-2 by FDA under an Emergency Use Authorization (EUA). This EUA will remain  in effect (meaning this test can be used) for the duration of the COVID-19 declaration under Section 564(b)(1) of the Act, 21 U.S.C. section 360bbb-3(b)(1), unless the authorization is terminated or revoked sooner.    Influenza A by PCR NEGATIVE NEGATIVE Final   Influenza B by PCR NEGATIVE NEGATIVE Final    Comment: (NOTE) The Xpert Xpress SARS-CoV-2/FLU/RSV assay is intended as an aid  in  the diagnosis of influenza from Nasopharyngeal swab specimens and  should not be used as a sole basis for treatment. Nasal washings and  aspirates are unacceptable for Xpert Xpress SARS-CoV-2/FLU/RSV  testing. Fact Sheet for Patients: PinkCheek.be Fact Sheet for Healthcare Providers: GravelBags.it This test is not yet approved or cleared by the [place] FDA and  has been authorized for detection and/or diagnosis of SARS-CoV-2 by  FDA under an Emergency Use Authorization (EUA). This EUA will remain  in effect (meaning this test can be used) for the duration of the  Covid-19 declaration under Section 564(b)(1) of the Act, 21  U.S.C. section 360bbb-3(b)(1), unless the authorization is  terminated or revoked. Performed at [facility], [place] [address], [place], [place] [address]   C difficile quick scan w PCR reflex     Status: None   Collection Time: 01/16/20 12:15 PM   Specimen: STOOL  Result Value Ref Range Status   C Diff antigen NEGATIVE NEGATIVE Final   C Diff toxin NEGATIVE NEGATIVE Final   C Diff interpretation No C. difficile detected.  Final    Comment: Performed at [facility], [place] [address], [place], [place] [address]    Procedures and diagnostic studies:  CT Head Wo Contrast  Result Date: 01/15/2020 CLINICAL DATA:  Altered mental status EXAM: CT HEAD WITHOUT CONTRAST TECHNIQUE: Contiguous axial images were obtained from the base of the skull through the vertex without intravenous contrast. COMPARISON:  10/26/2019 FINDINGS: Brain: There is atrophy and chronic small vessel disease changes. Old left basal ganglia lacunar infarct. No acute intracranial abnormality. Specifically, no hemorrhage, hydrocephalus, mass lesion, acute infarction, or significant intracranial injury. Vascular: No hyperdense vessel or unexpected calcification. Skull: No acute calvarial abnormality. Sinuses/Orbits: Visualized paranasal sinuses and mastoids clear. Orbital soft tissues unremarkable. Other: None IMPRESSION: Atrophy, chronic microvascular disease. No acute intracranial abnormality. Old left basal ganglia lacunar infarct. Electronically Signed   By: Rolm Baptise M.D.   On: 01/15/2020 17:21   CT ABDOMEN PELVIS W CONTRAST  Result Date: 01/15/2020 CLINICAL DATA:  86 year old female with diarrhea and leukocytosis. History of diverticulosis. EXAM: CT ABDOMEN AND PELVIS WITH CONTRAST TECHNIQUE: Multidetector CT imaging of the abdomen and pelvis was performed using the standard protocol following bolus administration of intravenous contrast. CONTRAST:  174mL OMNIPAQUE IOHEXOL 300 MG/ML  SOLN COMPARISON:  CT of the abdomen pelvis dated 06/26/2018. FINDINGS: Lower chest: The visualized lung bases are clear. Cardiac pacemaker leads noted. There is no intra-abdominal free air or free fluid. Hepatobiliary: There is a 2.5 x 1.3 cm lesion in the right lobe of the liver with nodular enhancement on arterial phase and delayed central enhancement. This is suboptimally characterized but the CT findings are most consistent with a hemangioma. This can be better evaluated with MRI on a nonemergent basis. The  liver is otherwise unremarkable. There is mild intrahepatic biliary ductal dilatation, likely related to cholecystectomy. The common bile duct measures approximately 14 mm in diameter. No retained calcified stone noted in the central CBD. Pancreas: Unremarkable. No pancreatic ductal dilatation or surrounding inflammatory changes. Spleen: Normal in size without focal abnormality. Adrenals/Urinary Tract: The adrenal glands are unremarkable. There is no hydronephrosis on either side. There is symmetric enhancement of the kidneys. There is a 1 cm left renal upper pole cyst. The visualized ureters and urinary bladder appear unremarkable. Stomach/Bowel: Evaluation of the bowel is limited in the absence of oral contrast. There is sigmoid diverticulosis. There is thickened appearance of  the distal colon and rectosigmoid favored to represent colitis rather than diverticulitis. There is loose stool within the colon compatible with diarrheal state. Correlation with clinical exam and stool cultures recommended. There is no bowel obstruction. The appendix is not visualized with certainty. No inflammatory changes identified in the right lower quadrant. Vascular/Lymphatic: Advanced aortoiliac atherosclerotic disease. The IVC is unremarkable. No portal venous gas. The aorta is ectatic measuring up to 2.3 cm in diameter. There is no adenopathy. Reproductive: Hysterectomy.  No adnexal masses. Other: Midline vertical anterior pelvic wall incisional scar. Musculoskeletal: Degenerative changes of the spine. 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 No cortical collapse or acute fracture. Grade 1 L4-L5 anterolisthesis. IMPRESSION: 1. Diarrheal state with findings of colitis. Correlation with clinical exam and stool cultures recommended. No bowel obstruction. 2. Sigmoid diverticulosis. 3. Probable right hepatic hemangioma. MRI may provide better characterization. 4.  Aortic Atherosclerosis (ICD10-I70.0). Electronically  Signed   By: Anner Crete M.D.   On: 01/15/2020 17:32    Medications:   . atorvastatin  80 mg Oral Daily  . clopidogrel  75 mg Oral Daily  . DULoxetine  60 mg Oral Daily  . feeding supplement  1 Container Oral TID BM  . gabapentin  100 mg Oral BID  . insulin aspart  0-9 Units Subcutaneous Q4H  . levothyroxine  100 mcg Oral Q0600  . metoprolol tartrate  25 mg Oral BID  . multivitamin with minerals  1 tablet Oral Daily  . pantoprazole  40 mg Oral BID  . polycarbophil  625 mg Oral Daily  . QUEtiapine  25 mg Oral QHS   Continuous Infusions:   LOS: 1 day   Geradine Girt  [facility]   How to contact the [facility] Attending or Consulting provider [place] or covering provider during after hours [place], for this patient?  1. Check the care team in [facility] and look for a) attending/consulting TRH provider listed and b) the [facility] team listed 2. Log into [email or web address] and use [facility]'s universal password to access. If you do not have the password, please contact the hospital operator. 3. Locate the [facility] provider you are looking for under [facility] and page to a number that you can be directly reached. 4. If you still have difficulty reaching the provider, please page the [facility] (Director on Call) for the Hospitalists listed on amion for assistance.  01/17/2020, 2:02 PM

## 2020-01-17 NOTE — Progress Notes (Signed)
Physical Therapy Treatment Patient Details Name: Janet Steele MRN: [id number] DOB: [birth year] Today's Date: 01/17/2020    History of Present Illness 86 y.o. female with medical history significant of CAD sp CABG x2 2003, HTN, HLD, DM 2, symptomatic bradycardia sp pacemaker carotid bruit, chronic diastolic CHF, GERD, esophageal stricture, hypothyroidism history of CVA in 2018 with intermittent aphasia dementia with occasional confusion.Pt presented with  Persistent diarrhea started this AM increased confusion     PT Comments    Pt tolerated treatment well, much more pleasant and agreeable to mobilize with therapy at this time. Pt demonstrates the need for minG for all OOB activity to steady, although no significant LOB noted. Pt dependent on UE support to maintain standing balance. Pt with much improve mobility this session, may be close to baseline but unsure due to no family present at sessions and patient with dementia. If patient has 24/7 assistance at home PT recommends HHPT. If 24/7 assistance is not available then PT continues to recommend SNF.   Follow Up Recommendations  SNF;Supervision/Assistance - 24 hour(home with HHPT if 24/7 assistance from family)     Equipment Recommendations  None recommended by PT(pt reports owning multiple walkers)    Recommendations for Other Services       Precautions / Restrictions Precautions Precautions: Fall Restrictions Weight Bearing Restrictions: No    Mobility  Bed Mobility Overal bed mobility: Needs Assistance Bed Mobility: Supine to Sit     Supine to sit: Supervision        Transfers Overall transfer level: Needs assistance Equipment used: Rolling walker (2 wheeled) Transfers: Sit to/from Stand Sit to Stand: Min guard            Ambulation/Gait Ambulation/Gait assistance: Min guard Gait Distance (Feet): 30 Feet Assistive device: Rolling walker (2 wheeled) Gait Pattern/deviations: Step-to pattern Gait velocity:  reduced Gait velocity interpretation: <1.8 ft/sec, indicate of risk for recurrent falls General Gait Details: pt with slight increase in lateral sway, shortened step to gait   Stairs             Wheelchair Mobility    Modified Rankin (Stroke Patients Only)       Balance Overall balance assessment: Needs assistance Sitting-balance support: No upper extremity supported;Feet supported Sitting balance-Leahy Scale: Good Sitting balance - Comments: supervision at edge of bed, donning socks   Standing balance support: Bilateral upper extremity supported Standing balance-Leahy Scale: Fair Standing balance comment: minG with BUE of RW                            Cognition Arousal/Alertness: Awake/alert Behavior During Therapy: WFL for tasks assessed/performed Overall Cognitive Status: History of cognitive impairments - at baseline                                 General Comments: pt remains confused, unsure how close to baseline she is.      Exercises General Exercises - Lower Extremity Ankle Circles/Pumps: AROM;Both;15 reps Gluteal Sets: AROM;Both;10 reps Long Arc Quad: AROM;Both;15 reps Hip ABduction/ADduction: AROM;Both;15 reps    General Comments General comments (skin integrity, edema, etc.): VSS      Pertinent Vitals/Pain Pain Assessment: No/denies pain    Home Living                      Prior Function  PT Goals (current goals can now be found in the care plan section) Acute Rehab PT Goals Patient Stated Goal: To go home Progress towards PT goals: Progressing toward goals    Frequency    Min 2X/week      PT Plan Current plan remains appropriate    Co-evaluation              AM-PAC PT "6 Clicks" Mobility   Outcome Measure  Help needed turning from your back to your side while in a flat bed without using bedrails?: None Help needed moving from lying on your back to sitting on the side of a  flat bed without using bedrails?: None Help needed moving to and from a bed to a chair (including a wheelchair)?: A Little Help needed standing up from a chair using your arms (e.g., wheelchair or bedside chair)?: A Little Help needed to walk in hospital room?: A Little Help needed climbing 3-5 steps with a railing? : A Lot 6 Click Score: 19    End of Session   Activity Tolerance: Patient tolerated treatment well Patient left: in chair;with call bell/phone within reach(RN made aware chair alarm box not present in room) Nurse Communication: Mobility status PT Visit Diagnosis: Muscle weakness (generalized) (M62.81)     Time: 0370-4888 PT Time Calculation (min) (ACUTE ONLY): 23 min  Charges:  $Gait Training: 8-22 mins $Therapeutic Exercise: 8-22 mins                     Zenaida Niece, PT, DPT Acute Rehabilitation Pager: [phone]    Zenaida Niece 01/17/2020, 11:33 AM

## 2020-01-17 NOTE — Progress Notes (Signed)
Patient is refusing Tele monitor.

## 2020-01-17 NOTE — NC FL2 (Signed)
[place] LEVEL OF CARE SCREENING TOOL     IDENTIFICATION  Patient Name: Janet Steele Birthdate: Jul 22, 1929 Sex: female Admission Date (Current Location): 01/15/2020  [facility] [place] Number:  Herbalist and Address:  [facility] [place], [place] [address] [place], [place] [address]      Provider Number: [id number]  Attending Physician Name and Address:  Geradine Girt, DO  Relative Name and Phone Number:  Coralyn Mark (son) [phone]    Current Level of Care: Hospital Recommended Level of Care: [place] Prior Approval Number:    Date Approved/Denied:   PASRR Number: (Under review)  Discharge Plan: SNF    Current Diagnoses: Patient Active Problem List   Diagnosis Date Noted  . AKI (acute kidney injury) ([place]) 01/15/2020  . Dehydration 01/15/2020  . Hyperkalemia 01/15/2020  . Colitis 01/15/2020  . Headache 07/21/2019  . Lower abdominal pain 02/05/2019  . Other headache syndrome 08/14/2018  . Depression 08/14/2018  . Weight loss 06/05/2018  . Stroke ([place]) 11/04/2017  . Weakness 11/03/2017  . History of cerebrovascular accident (CVA) with residual deficit 11/03/2017  . Chronic back pain 07/04/2017  . PAD (peripheral artery disease) ([place]) 01/17/2017  . Overactive bladder 12/17/2016  . Anemia 05/20/2016  . Coronary artery disease involving coronary bypass graft of native heart without angina pectoris 05/19/2016  . Numbness and tingling of foot 12/31/2015  . MCI (mild cognitive impairment) with memory loss 11/18/2015  . Facial tic 05/07/2015  . Sick sinus syndrome ([place]) 02/26/2015  . Cervical dystonia 12/04/2014  . Chronic motor tic 09/12/2014  . Pacemaker-Medtronic 07/07/2012  . GERD (gastroesophageal reflux disease) 07/05/2012  . Cough 08/04/2010  . Abnormal involuntary movement 12/17/2009  . Disturbance in sleep behavior 12/11/2009  . CAD, ARTERY BYPASS GRAFT 09/24/2009  . CAROTID BRUIT 09/24/2009  .  Diabetes ([place]) 01/30/2009  . DYSPHAGIA 06/19/2008  . Anxiety 02/14/2008  . EROSIVE ESOPHAGITIS 02/14/2008  . CONSTIPATION, CHRONIC 02/14/2008  . DEGENERATIVE JOINT DISEASE 02/14/2008  . HEARING LOSS 01/04/2008  . PVD 01/04/2008  . Hypothyroidism 09/25/2007  . HYPERLIPIDEMIA 09/25/2007  . Essential hypertension 09/07/2007  . Diabetic polyneuropathy ([place]) 03/16/2007  . Stricture and stenosis of esophagus 03/16/2007  . HIATAL HERNIA 02/16/2007  . HEMORRHOIDS 12/31/1997  . DIVERTICULOSIS, COLON 12/31/1997    Orientation RESPIRATION BLADDER Height & Weight     Self  Normal Incontinent, External catheter Weight: 115 lb 4.8 oz (52.3 kg) Height:  5' (152.4 cm)  BEHAVIORAL SYMPTOMS/MOOD NEUROLOGICAL BOWEL NUTRITION STATUS      Incontinent Diet(see discharge summary)  AMBULATORY STATUS COMMUNICATION OF NEEDS Skin   Limited Assist Verbally Other (Comment)(ecchymosis arms)                       Personal Care Assistance Level of Assistance  Bathing, Feeding, Dressing, Total care Bathing Assistance: Limited assistance Feeding assistance: Independent Dressing Assistance: Limited assistance Total Care Assistance: Limited assistance   Functional Limitations Info  Sight, Speech, Hearing Sight Info: Adequate Hearing Info: Adequate Speech Info: Adequate    SPECIAL CARE FACTORS FREQUENCY  PT (By licensed PT), OT (By licensed OT)     PT Frequency: min 5x weekly OT Frequency: min 5x weekly            Contractures Contractures Info: Not present    Additional Factors Info  Code Status, Allergies, Isolation Precautions Code Status Info: full Allergies Info: Morphine, penicillins, prednisone, sulfonamide derivatives, codeine, hydrocodone-acetaminophen, meperidine Hcl, metoclopramide  hcl, metoprolol succinate, moxifloxacin, oxycodone-aspirin, pentazocine lactate, pioglitazone Psychotropic Info: Cymbalta 60mg  daily, Seroquel 25mg  daily at bed Insulin Sliding Scale Info: 0-9 units  every 4 hours Isolation Precautions Info: enteric pre     Current Medications (01/17/2020):  This is the current hospital active medication list Current Facility-Administered Medications  Medication Dose Route Frequency Provider Last Rate Last Admin  . acetaminophen (TYLENOL) tablet 650 mg  650 mg Oral Q6H PRN Toy Baker, MD       Or  . acetaminophen (TYLENOL) suppository 650 mg  650 mg Rectal Q6H PRN Doutova, Anastassia, MD      . atorvastatin (LIPITOR) tablet 80 mg  80 mg Oral Daily Doutova, Anastassia, MD   80 mg at 01/17/20 0956  . clopidogrel (PLAVIX) tablet 75 mg  75 mg Oral Daily Toy Baker, MD   75 mg at 01/17/20 0956  . DULoxetine (CYMBALTA) DR capsule 60 mg  60 mg Oral Daily Doutova, Anastassia, MD   60 mg at 01/17/20 0956  . feeding supplement (BOOST / RESOURCE BREEZE) liquid 1 Container  1 Container Oral TID BM Vann, Jessica U, DO   1 Container at 01/17/20 1444  . gabapentin (NEURONTIN) capsule 100 mg  100 mg Oral BID Toy Baker, MD   100 mg at 01/17/20 0956  . insulin aspart (novoLOG) injection 0-9 Units  0-9 Units Subcutaneous Q4H Toy Baker, MD   2 Units at 01/17/20 0956  . levothyroxine (SYNTHROID) tablet 100 mcg  100 mcg Oral Q0600 Eulogio Bear U, DO   100 mcg at 01/17/20 7017  . metoprolol tartrate (LOPRESSOR) tablet 25 mg  25 mg Oral BID Toy Baker, MD   25 mg at 01/17/20 0956  . multivitamin with minerals tablet 1 tablet  1 tablet Oral Daily Eulogio Bear U, DO   1 tablet at 01/17/20 0956  . ondansetron (ZOFRAN) tablet 4 mg  4 mg Oral Q6H PRN Doutova, Anastassia, MD       Or  . ondansetron (ZOFRAN) injection 4 mg  4 mg Intravenous Q6H PRN Doutova, Anastassia, MD      . pantoprazole (PROTONIX) EC tablet 40 mg  40 mg Oral BID Toy Baker, MD   40 mg at 01/17/20 0956  . polycarbophil (FIBERCON) tablet 625 mg  625 mg Oral Daily Eulogio Bear U, DO   625 mg at 01/17/20 1448  . QUEtiapine (SEROQUEL) tablet 25 mg  25 mg Oral  QHS Toy Baker, MD   25 mg at 01/16/20 2205     Discharge Medications: Please see discharge summary for a list of discharge medications.  Relevant Imaging Results:  Relevant Lab Results:   Additional Information SSN: [id number]  Alberteen Sam, LCSW

## 2020-01-17 NOTE — TOC Initial Note (Signed)
Transition of Care [facility]) - Initial/Assessment Note    Patient Details  Name: Janet Steele MRN: [id number] Date of Birth: January 08, 1929  Transition of Care [facility]) CM/SW Contact:    Alberteen Sam, [place] Phone Number: [phone] 01/17/2020, 2:42 PM  Clinical Narrative:                  CSW spoke with patient's son Coralyn Mark regarding PT recommendation of SNF, Coralyn Mark is in agreement as no family avail to provide 24/7 assistance at home. Coralyn Mark reports preference is for [facility] but agreeable for CSW to fax out referrals to SNFs in [place] area.   CSW has followed up with [facility] who reports no beds avail. CSW will continue to fax to other facilities for bed offers.   Expected Discharge Plan: Skilled Nursing Facility Barriers to Discharge: Continued Medical Work up   Patient Goals and CMS Choice   CMS Medicare.gov Compare Post Acute Care list provided to:: Patient Represenative (must comment)(son Coralyn Mark) Choice offered to / list presented to : Adult Children  Expected Discharge Plan and Services Expected Discharge Plan: [place] Acute Care Choice: [place] Living arrangements for the past 2 months: Single Family Home                                      Prior Living Arrangements/Services Living arrangements for the past 2 months: Single Family Home Lives with:: Spouse Patient language and need for interpreter reviewed:: Yes Do you feel safe going back to the place where you live?: No   needs short term rehab  Need for Family Participation in Patient Care: Yes (Comment) Care giver support system in place?: Yes (comment)   Criminal Activity/Legal Involvement Pertinent to Current Situation/Hospitalization: No - Comment as needed  Activities of Daily Living Home Assistive Devices/Equipment: Dentures (specify type) ADL Screening (condition at time of admission) Patient's cognitive ability adequate to safely complete daily  activities?: No Is the patient deaf or have difficulty hearing?: No Does the patient have difficulty seeing, even when wearing glasses/contacts?: No Does the patient have difficulty concentrating, remembering, or making decisions?: Yes Patient able to express need for assistance with ADLs?: Yes Does the patient have difficulty dressing or bathing?: Yes Independently performs ADLs?: No Communication: Independent Dressing (OT): Needs assistance Is this a change from baseline?: Change from baseline, expected to last <3days Grooming: Independent Feeding: Independent Bathing: Needs assistance Is this a change from baseline?: Change from baseline, expected to last <3 days Toileting: Needs assistance Is this a change from baseline?: Change from baseline, expected to last <3 days In/Out Bed: Needs assistance Is this a change from baseline?: Change from baseline, expected to last <3 days Walks in Home: Independent with device (comment) Does the patient have difficulty walking or climbing stairs?: No Weakness of Legs: Both Weakness of Arms/Hands: None  Permission Sought/Granted Permission sought to share information with : Case Manager, Customer service manager, Family Supports Permission granted to share information with : Yes, Verbal Permission Granted  Share Information with NAME: Coralyn Mark  Permission granted to share info w AGENCY: SNFs  Permission granted to share info w Relationship: son  Permission granted to share info w Contact Information: [phone]  Emotional Assessment Appearance:: Appears stated age Attitude/Demeanor/Rapport: Gracious Affect (typically observed): Calm Orientation: : Oriented to Self Alcohol / Substance Use: Not Applicable Psych Involvement: No (comment)  Admission diagnosis:  Dehydration [E86.0] Colitis [K52.9] AKI (acute kidney injury) ([place]) [N17.9] Patient Active Problem List   Diagnosis Date Noted  . AKI (acute kidney injury) ([place]) 01/15/2020   . Dehydration 01/15/2020  . Hyperkalemia 01/15/2020  . Colitis 01/15/2020  . Headache 07/21/2019  . Lower abdominal pain 02/05/2019  . Other headache syndrome 08/14/2018  . Depression 08/14/2018  . Weight loss 06/05/2018  . Stroke ([place] ) 11/04/2017  . Weakness 11/03/2017  . History of cerebrovascular accident (CVA) with residual deficit 11/03/2017  . Chronic back pain 07/04/2017  . PAD (peripheral artery disease) ([place]) 01/17/2017  . Overactive bladder 12/17/2016  . Anemia 05/20/2016  . Coronary artery disease involving coronary bypass graft of native heart without angina pectoris 05/19/2016  . Numbness and tingling of foot 12/31/2015  . MCI (mild cognitive impairment) with memory loss 11/18/2015  . Facial tic 05/07/2015  . Sick sinus syndrome ([place]) 02/26/2015  . Cervical dystonia 12/04/2014  . Chronic motor tic 09/12/2014  . Pacemaker-Medtronic 07/07/2012  . GERD (gastroesophageal reflux disease) 07/05/2012  . Cough 08/04/2010  . Abnormal involuntary movement 12/17/2009  . Disturbance in sleep behavior 12/11/2009  . CAD, ARTERY BYPASS GRAFT 09/24/2009  . CAROTID BRUIT 09/24/2009  . Diabetes ([place]) 01/30/2009  . DYSPHAGIA 06/19/2008  . Anxiety 02/14/2008  . EROSIVE ESOPHAGITIS 02/14/2008  . CONSTIPATION, CHRONIC 02/14/2008  . DEGENERATIVE JOINT DISEASE 02/14/2008  . HEARING LOSS 01/04/2008  . PVD 01/04/2008  . Hypothyroidism 09/25/2007  . HYPERLIPIDEMIA 09/25/2007  . Essential hypertension 09/07/2007  . Diabetic polyneuropathy ([place] ) 03/16/2007  . Stricture and stenosis of esophagus 03/16/2007  . HIATAL HERNIA 02/16/2007  . HEMORRHOIDS 12/31/1997  . DIVERTICULOSIS, COLON 12/31/1997   PCP:  Binnie Rail, MD Pharmacy:   [facility] [place], [place] - [address] [place] [address] Phone: [phone] Fax: [phone]  [facility] [phone] - [place], [place] - [address] AT [address] [place] [address] Phone: [phone] Fax: [phone]     Social Determinants of Health ([place]) Interventions    Readmission Risk Interventions No flowsheet data found.

## 2020-01-17 NOTE — Telephone Encounter (Signed)
Spouse Briani Maul called to discuss pt going to a rehab center from [facility] [phone]. [name] states hospital staff is talking about sending pt to a rehab center. Please call spouse.

## 2020-01-18 LAB — BASIC METABOLIC PANEL
Anion gap: 4 — ABNORMAL LOW (ref 5–15)
BUN: 19 mg/dL (ref 8–23)
CO2: 22 mmol/L (ref 22–32)
Calcium: 8.6 mg/dL — ABNORMAL LOW (ref 8.9–10.3)
Chloride: 107 mmol/L (ref 98–111)
Creatinine, Ser: 1.39 mg/dL — ABNORMAL HIGH (ref 0.44–1.00)
GFR calc Af Amer: 39 mL/min — ABNORMAL LOW (ref >60)
GFR calc non Af Amer: 34 mL/min — ABNORMAL LOW (ref >60)
Glucose, Bld: 170 mg/dL — ABNORMAL HIGH (ref 70–99)
Potassium: 4.1 mmol/L (ref 3.5–5.1)
Sodium: 133 mmol/L — ABNORMAL LOW (ref 135–145)

## 2020-01-18 LAB — T4, FREE: Free T4: 0.7 ng/dL (ref 0.61–1.12)

## 2020-01-18 LAB — GLUCOSE, CAPILLARY
Glucose-Capillary: 132 mg/dL — ABNORMAL HIGH (ref 70–99)
Glucose-Capillary: 153 mg/dL — ABNORMAL HIGH (ref 70–99)
Glucose-Capillary: 158 mg/dL — ABNORMAL HIGH (ref 70–99)
Glucose-Capillary: 220 mg/dL — ABNORMAL HIGH (ref 70–99)
Glucose-Capillary: 204 mg/dL — ABNORMAL HIGH (ref 70–99)

## 2020-01-18 LAB — SARS CORONAVIRUS 2 (TAT 6-24 HRS): SARS Coronavirus 2: NEGATIVE

## 2020-01-18 MED ORDER — ENSURE ENLIVE PO LIQD
237.0000 mL | Freq: Two times a day (BID) | ORAL | Status: DC
  Administered 2020-01-18 – 2020-01-19 (×4): 237 mL via ORAL

## 2020-01-18 MED ORDER — INSULIN ASPART 100 UNIT/ML ~~LOC~~ SOLN
0.0000 [IU] | Freq: Three times a day (TID) | SUBCUTANEOUS | Status: DC
  Administered 2020-01-19: 3 [IU] via SUBCUTANEOUS
  Administered 2020-01-19: 2 [IU] via SUBCUTANEOUS

## 2020-01-18 NOTE — Progress Notes (Signed)
PROGRESS NOTE    Janet Steele  NGE:[id number] DOB: 08/26/1929 DOA: 01/15/2020 PCP: Binnie Rail, MD     Brief Narrative:  Janet Steele is an 86 y.o. female with medical history significant of CAD sp CABG x2 2003, HTN, HLD, DM 2, symptomatic bradycardia sp pacemaker carotid bruit, chronic diastolic CHF, GERD, esophageal stricture,hypothyroidism, history of CVA in 2018 with intermittent aphasia, dementia with occasional confusion. She presented on 2/9 with chief complaint of persistent diarrhea and increased confusion.  She was admitted for colitis and acute kidney injury.  New events last 24 hours / Subjective: Remains confused this morning, states that she needs to get her robe and things and get moving.  She does not appear to be oriented to place, although is able to answer some questions and follow commands appropriately.  Assessment & Plan:   Active Problems:   Hypothyroidism   Diabetes ([place])   HYPERLIPIDEMIA   Diabetic polyneuropathy ([place])   Essential hypertension   GERD (gastroesophageal reflux disease)   Pacemaker-Medtronic   Coronary artery disease involving coronary bypass graft of native heart without angina pectoris   Anemia   History of cerebrovascular accident (CVA) with residual deficit   AKI (acute kidney injury) ([place])   Dehydration   Hyperkalemia   Colitis   Acute colitis -C. difficile negative -GI PCR pending -Reports that diarrhea has improved, tolerating diet  AKI on CKD stage 3a  -Baseline Cr ~ 1 -Improving with IV fluids -Encourage PO intake   Essential hypertension -Hold Cozaar in setting of AKI -Continue metoprolol  Hypothyroidism -TSH elevated, free T4 0.7  -Continue Synthroid  Hyperlipidemia -Continue Lipitor  History of CVA -Continue Plavix  Type 2 diabetes -Sliding scale insulin  Dementia -Continue Seroquel and delirium precaution  Depression -Continue Cymbalta   DVT prophylaxis: SCDs Code Status: Full code Family  Communication: No family at bedside Disposition Plan:  . Patient is from home prior to admission. . Medically improved from colitis and AKI standpoint. Tiajuana Amass to discharge currently pending PASRR and SNF bed availability. . Suspect patient will discharge to SNF in next 1-2 days.     Consultants:   None  Procedures:   None   Antimicrobials:  Anti-infectives (From admission, onward)   None        Objective: Vitals:   01/17/20 2354 01/18/20 0353 01/18/20 0357 01/18/20 0358  BP: (!) 130/58   137/60  Pulse: 62   66  Resp: 17   18  Temp:   (!) 97 F (36.1 C)   TempSrc:   Oral   SpO2: 100%   94%  Weight:  49.4 kg    Height:        Intake/Output Summary (Last 24 hours) at 01/18/2020 1013 Last data filed at 01/18/2020 0400 Gross per 24 hour  Intake 720 ml  Output 1 ml  Net 719 ml   Filed Weights   01/16/20 0406 01/17/20 0403 01/18/20 0353  Weight: 52 kg 52.3 kg 49.4 kg    Examination:  General exam: Appears comfortable without acute distress Respiratory system: Clear to auscultation. Respiratory effort normal. No respiratory distress. No conversational dyspnea.  Cardiovascular system: S1 & S2 heard, RRR. No murmurs. No pedal edema. Gastrointestinal system: Abdomen is nondistended, soft and nontender. Normal bowel sounds heard. Central nervous system: Alert without focal neurologic deficit Extremities: Symmetric in appearance  Skin: No rashes, lesions or ulcers on exposed skin  Psychiatry: With dementia  Data Reviewed: I have personally reviewed following labs and  imaging studies  CBC: Recent Labs  Lab 01/15/20 1630 01/16/20 0102 01/16/20 1043  WBC 14.0* 9.4 8.1  NEUTROABS 12.1*  --   --   HGB 10.9* 9.5* 9.0*  HCT 34.4* 29.7* 28.3*  MCV 86.4 86.3 87.1  PLT 273 238 294   Basic Metabolic Panel: Recent Labs  Lab 01/15/20 1630 01/16/20 0102 01/16/20 1043 01/18/20 0825  NA 139  --  138 133*  K 5.2*  --  4.4 4.1  CL 106  --  110 107  CO2 23  --   19* 22  GLUCOSE 134*  --  190* 170*  BUN 24*  --  19 19  CREATININE 1.59*  --  1.31* 1.39*  CALCIUM 9.5  --  8.5* 8.6*  MG  --  1.9  --   --   PHOS  --  2.9  --   --    GFR: Estimated Creatinine Clearance: 20.1 mL/min (A) (by C-G formula based on SCr of 1.39 mg/dL (H)). Liver Function Tests: Recent Labs  Lab 01/15/20 1630  AST 20  ALT 14  ALKPHOS 109  BILITOT 1.8*  PROT 7.1  ALBUMIN 4.1   Recent Labs  Lab 01/15/20 1630  LIPASE 25   No results for input(s): AMMONIA in the last 168 hours. Coagulation Profile: No results for input(s): INR, PROTIME in the last 168 hours. Cardiac Enzymes: No results for input(s): CKTOTAL, CKMB, CKMBINDEX, TROPONINI in the last 168 hours. BNP (last 3 results) No results for input(s): PROBNP in the last 8760 hours. HbA1C: Recent Labs    01/16/20 0102  HGBA1C 6.7*   CBG: Recent Labs  Lab 01/17/20 1741 01/17/20 2007 01/17/20 2356 01/18/20 0401 01/18/20 0752  GLUCAP 177* 142* 145* 132* 153*   Lipid Profile: No results for input(s): CHOL, HDL, LDLCALC, TRIG, CHOLHDL, LDLDIRECT in the last 72 hours. Thyroid Function Tests: Recent Labs    01/16/20 0102 01/18/20 0825  TSH 15.932*  --   FREET4  --  0.70   Anemia Panel: Recent Labs    01/16/20 0102  VITAMINB12 241  FOLATE 15.1  FERRITIN 21  TIBC 382  IRON 35  RETICCTPCT 1.1   Sepsis Labs: Recent Labs  Lab 01/15/20 2013 01/16/20 0102  PROCALCITON 1.07  --   LATICACIDVEN 1.0 0.8    Recent Results (from the past 240 hour(s))  Respiratory Panel by RT PCR (Flu A&B, Covid) - Nasopharyngeal Swab     Status: None   Collection Time: 01/15/20  8:21 PM   Specimen: Nasopharyngeal Swab  Result Value Ref Range Status   SARS Coronavirus 2 by RT PCR NEGATIVE NEGATIVE Final    Comment: (NOTE) SARS-CoV-2 target nucleic acids are NOT DETECTED. The SARS-CoV-2 RNA is generally detectable in upper respiratoy specimens during the acute phase of infection. The lowest concentration of  SARS-CoV-2 viral copies this assay can detect is 131 copies/mL. A negative result does not preclude SARS-Cov-2 infection and should not be used as the sole basis for treatment or other patient management decisions. A negative result may occur with  improper specimen collection/handling, submission of specimen other than nasopharyngeal swab, presence of viral mutation(s) within the areas targeted by this assay, and inadequate number of viral copies (<131 copies/mL). A negative result must be combined with clinical observations, patient history, and epidemiological information. The expected result is Negative. Fact Sheet for Patients:  PinkCheek.be Fact Sheet for Healthcare Providers:  GravelBags.it This test is not yet ap proved or cleared by the [place]  FDA and  has been authorized for detection and/or diagnosis of SARS-CoV-2 by FDA under an Emergency Use Authorization (EUA). This EUA will remain  in effect (meaning this test can be used) for the duration of the COVID-19 declaration under Section 564(b)(1) of the Act, 21 U.S.C. section 360bbb-3(b)(1), unless the authorization is terminated or revoked sooner.    Influenza A by PCR NEGATIVE NEGATIVE Final   Influenza B by PCR NEGATIVE NEGATIVE Final    Comment: (NOTE) The Xpert Xpress SARS-CoV-2/FLU/RSV assay is intended as an aid in  the diagnosis of influenza from Nasopharyngeal swab specimens and  should not be used as a sole basis for treatment. Nasal washings and  aspirates are unacceptable for Xpert Xpress SARS-CoV-2/FLU/RSV  testing. Fact Sheet for Patients: PinkCheek.be Fact Sheet for Healthcare Providers: GravelBags.it This test is not yet approved or cleared by the [place] FDA and  has been authorized for detection and/or diagnosis of SARS-CoV-2 by  FDA under an Emergency Use Authorization (EUA).  This EUA will remain  in effect (meaning this test can be used) for the duration of the  Covid-19 declaration under Section 564(b)(1) of the Act, 21  U.S.C. section 360bbb-3(b)(1), unless the authorization is  terminated or revoked. Performed at [facility], [place] [address] [place], [place] [address]   C difficile quick scan w PCR reflex     Status: None   Collection Time: 01/16/20 12:15 PM   Specimen: STOOL  Result Value Ref Range Status   C Diff antigen NEGATIVE NEGATIVE Final   C Diff toxin NEGATIVE NEGATIVE Final   C Diff interpretation No C. difficile detected.  Final    Comment: Performed at [facility], [place] [address], [place], [place] [address]      Radiology Studies: No results found.    Scheduled Meds: . atorvastatin  80 mg Oral Daily  . clopidogrel  75 mg Oral Daily  . DULoxetine  60 mg Oral Daily  . feeding supplement (ENSURE ENLIVE)  237 mL Oral BID BM  . gabapentin  100 mg Oral BID  . insulin aspart  0-9 Units Subcutaneous Q4H  . levothyroxine  100 mcg Oral Q0600  . metoprolol tartrate  25 mg Oral BID  . multivitamin with minerals  1 tablet Oral Daily  . pantoprazole  40 mg Oral BID  . polycarbophil  625 mg Oral Daily  . QUEtiapine  25 mg Oral QHS   Continuous Infusions:   LOS: 2 days      Time spent: 35 minutes   Dessa Phi, DO [facility] 01/18/2020, 10:13 AM   Available via Epic secure chat 7am-7pm After these hours, please refer to coverage provider listed on [email or web address]

## 2020-01-18 NOTE — Telephone Encounter (Signed)
Husband just wanted our opinion on rehab. Son helped with decision and pt is going to rehab after the hospital to help regain strength back to walk.

## 2020-01-18 NOTE — Discharge Summary (Signed)
Physician Discharge Summary  Janet Steele SWF:[id number] DOB: [birth year] DOA: 01/15/2020  PCP: Binnie Rail, MD  Admit date: 01/15/2020 Discharge date: 01/18/2020  Admitted From: Home Disposition:  SNF   Recommendations for Outpatient Follow-up:  1. Follow up with PCP in 1 week 2. Please obtain BMP in 1 week. Cozaar and metformin on hold for now due to AKI. Resume as outpatient once kidney function normalizes.  3. Follow up on final GI PCR panel, pending at time of discharge. C Diff negative.   Discharge Condition: Stable CODE STATUS: Full  Diet recommendation: Heart healthy   Brief/Interim Summary: Janet Steele is an 86 y.o.femalewith medical history significant of CAD sp CABG x2 2003, HTN, HLD, DM 2, symptomatic bradycardia sp pacemaker carotid bruit, chronic diastolic CHF, GERD, esophageal stricture,hypothyroidism, history of CVA in 2018 with intermittent aphasia, dementia with occasional confusion. She presented on 2/9 with chief complaint of persistent diarrhea and increased confusion.  She was admitted for colitis and acute kidney injury. On day of discharge, symptoms of colitis improved, tolerating diet.   Discharge Diagnoses:  Active Problems:   Hypothyroidism   Diabetes ([place])   HYPERLIPIDEMIA   Diabetic polyneuropathy ([place])   Essential hypertension   GERD (gastroesophageal reflux disease)   Pacemaker-[employer]   Coronary artery disease involving coronary bypass graft of native heart without angina pectoris   Anemia   History of cerebrovascular accident (CVA) with residual deficit   AKI (acute kidney injury) ([place])   Dehydration   Hyperkalemia   Colitis   Acute colitis -C. difficile negative -GI PCR pending -Reports that diarrhea has improved, tolerating diet  AKI on CKD stage 3a  -Baseline Cr ~ 1 -Improving with IV fluids -Encourage PO intake   Essential hypertension -Hold Cozaar in setting of AKI -Continue metoprolol  Hypothyroidism -TSH elevated,  free T4 0.7  -Continue Synthroid  Hyperlipidemia -Continue Lipitor  History of CVA -Continue Plavix  Type 2 diabetes -Hold metformin -Resume januvia   Dementia -Continue Seroquel and delirium precaution  Depression -Continue Cymbalta   Discharge Instructions  Discharge Instructions    Diet - low sodium heart healthy   Complete by: As directed    Increase activity slowly   Complete by: As directed      Allergies as of 01/18/2020      Reactions   Morphine Hives, Itching, Rash   Penicillins Itching, Rash   Has patient had a PCN reaction causing immediate rash, facial/tongue/throat swelling, SOB or lightheadedness with hypotension: Yes Has patient had a PCN reaction causing severe rash involving mucus membranes or skin necrosis: No Has patient had a PCN reaction that required hospitalization No Has patient had a PCN reaction occurring within the last 10 years: No If all of the above answers are "NO", then may proceed with Cephalosporin use.   Prednisone Other (See Comments)   MEMORY LOSS    Sulfonamide Derivatives Itching, Rash   Codeine Rash   Hydrocodone-acetaminophen Other (See Comments)   unknown   Meperidine Hcl Itching   Metoclopramide Hcl Itching, Rash   Metoprolol Succinate Other (See Comments)   nervous   Moxifloxacin Itching   Oxycodone-aspirin Rash   Pentazocine Lactate Nausea Only   Pioglitazone Other (See Comments)   bloating      Medication List    STOP taking these medications   losartan 25 MG tablet Commonly known as: COZAAR   metFORMIN 1000 MG tablet Commonly known as: GLUCOPHAGE     TAKE these medications   acetaminophen 500  MG tablet Commonly known as: TYLENOL Take 2 tablets (1,000 mg total) by mouth every 6 (six) hours as needed. What changed: reasons to take this   atorvastatin 80 MG tablet Commonly known as: LIPITOR TAKE 1 TABLET BY MOUTH EVERY DAY   clopidogrel 75 MG tablet Commonly known as: PLAVIX TAKE 1 TABLET BY  MOUTH EVERY DAY   DULoxetine 60 MG capsule Commonly known as: CYMBALTA TAKE 1 CAPSULE(60 MG) BY MOUTH DAILY What changed: See the new instructions.   gabapentin 100 MG capsule Commonly known as: NEURONTIN Take 1 capsule (100 mg total) by mouth 2 (two) times daily.   guaiFENesin 600 MG 12 hr tablet Commonly known as: Mucinex Take 1 tablet (600 mg total) by mouth 2 (two) times daily. Take with lots of liquid, hot liquids.   Januvia 50 MG tablet Generic drug: sitaGLIPtin Take 50 mg by mouth daily.   levothyroxine 88 MCG tablet Commonly known as: SYNTHROID TAKE 1 TABLET(88 MCG) BY MOUTH DAILY What changed: See the new instructions.   metoprolol tartrate 25 MG tablet Commonly known as: LOPRESSOR TAKE 1 TABLET BY MOUTH TWICE DAILY   nitroGLYCERIN 0.4 MG SL tablet Commonly known as: NITROSTAT Place 1 tablet (0.4 mg total) under the tongue every 5 (five) minutes as needed. For chest pain   pantoprazole 40 MG tablet Commonly known as: PROTONIX TAKE 1 TABLET BY MOUTH TWICE DAILY   QUEtiapine 25 MG tablet Commonly known as: SEROQUEL TAKE 1 TABLET BY MOUTH AT BEDTIME. START AFTER MIRTAZIPINE IS TAPERED OFF What changed: See the new instructions.      Contact information for after-discharge care    [facility] .   Service: Skilled Nursing Contact information: [place] [phone]             Allergies  Allergen Reactions  . Morphine Hives, Itching and Rash  . Penicillins Itching and Rash    Has patient had a PCN reaction causing immediate rash, facial/tongue/throat swelling, SOB or lightheadedness with hypotension: Yes Has patient had a PCN reaction causing severe rash involving mucus membranes or skin necrosis: No Has patient had a PCN reaction that required hospitalization No Has patient had a PCN reaction occurring within the last 10 years: No If all of the above answers are  "NO", then may proceed with Cephalosporin use.   . Prednisone Other (See Comments)    MEMORY LOSS   . Sulfonamide Derivatives Itching and Rash  . Codeine Rash  . Hydrocodone-Acetaminophen Other (See Comments)    unknown  . Meperidine Hcl Itching  . Metoclopramide Hcl Itching and Rash  . Metoprolol Succinate Other (See Comments)    nervous  . Moxifloxacin Itching  . Oxycodone-Aspirin Rash  . Pentazocine Lactate Nausea Only  . Pioglitazone Other (See Comments)    bloating    Consultations:  None   Procedures/Studies: CT Head Wo Contrast  Result Date: 01/15/2020 CLINICAL DATA:  Altered mental status EXAM: CT HEAD WITHOUT CONTRAST TECHNIQUE: Contiguous axial images were obtained from the base of the skull through the vertex without intravenous contrast. COMPARISON:  10/26/2019 FINDINGS: Brain: There is atrophy and chronic small vessel disease changes. Old left basal ganglia lacunar infarct. No acute intracranial abnormality. Specifically, no hemorrhage, hydrocephalus, mass lesion, acute infarction, or significant intracranial injury. Vascular: No hyperdense vessel or unexpected calcification. Skull: No acute calvarial abnormality. Sinuses/Orbits: Visualized paranasal sinuses and mastoids clear. Orbital soft tissues unremarkable. Other: None IMPRESSION: Atrophy, chronic  microvascular disease. No acute intracranial abnormality. Old left basal ganglia lacunar infarct. Electronically Signed   By: Rolm Baptise M.D.   On: 01/15/2020 17:21   CT ABDOMEN PELVIS W CONTRAST  Result Date: 01/15/2020 CLINICAL DATA:  86 year old female with diarrhea and leukocytosis. History of diverticulosis. EXAM: CT ABDOMEN AND PELVIS WITH CONTRAST TECHNIQUE: Multidetector CT imaging of the abdomen and pelvis was performed using the standard protocol following bolus administration of intravenous contrast. CONTRAST:  148mL OMNIPAQUE IOHEXOL 300 MG/ML  SOLN COMPARISON:  CT of the abdomen pelvis dated 06/26/2018.  FINDINGS: Lower chest: The visualized lung bases are clear. Cardiac pacemaker leads noted. There is no intra-abdominal free air or free fluid. Hepatobiliary: There is a 2.5 x 1.3 cm lesion in the right lobe of the liver with nodular enhancement on arterial phase and delayed central enhancement. This is suboptimally characterized but the CT findings are most consistent with a hemangioma. This can be better evaluated with MRI on a nonemergent basis. The liver is otherwise unremarkable. There is mild intrahepatic biliary ductal dilatation, likely related to cholecystectomy. The common bile duct measures approximately 14 mm in diameter. No retained calcified stone noted in the central CBD. Pancreas: Unremarkable. No pancreatic ductal dilatation or surrounding inflammatory changes. Spleen: Normal in size without focal abnormality. Adrenals/Urinary Tract: The adrenal glands are unremarkable. There is no hydronephrosis on either side. There is symmetric enhancement of the kidneys. There is a 1 cm left renal upper pole cyst. The visualized ureters and urinary bladder appear unremarkable. Stomach/Bowel: Evaluation of the bowel is limited in the absence of oral contrast. There is sigmoid diverticulosis. There is thickened appearance of the distal colon and rectosigmoid favored to represent colitis rather than diverticulitis. There is loose stool within the colon compatible with diarrheal state. Correlation with clinical exam and stool cultures recommended. There is no bowel obstruction. The appendix is not visualized with certainty. No inflammatory changes identified in the right lower quadrant. Vascular/Lymphatic: Advanced aortoiliac atherosclerotic disease. The IVC is unremarkable. No portal venous gas. The aorta is ectatic measuring up to 2.3 cm in diameter. There is no adenopathy. Reproductive: Hysterectomy.  No adnexal masses. Other: Midline vertical anterior pelvic wall incisional scar. Musculoskeletal: Degenerative  changes of the spine. 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 No cortical collapse or acute fracture. Grade 1 L4-L5 anterolisthesis. IMPRESSION: 1. Diarrheal state with findings of colitis. Correlation with clinical exam and stool cultures recommended. No bowel obstruction. 2. Sigmoid diverticulosis. 3. Probable right hepatic hemangioma. MRI may provide better characterization. 4.  Aortic Atherosclerosis (ICD10-I70.0). Electronically Signed   By: Anner Crete M.D.   On: 01/15/2020 17:32       Discharge Exam: Vitals:   01/18/20 0358 01/18/20 1202  BP: 137/60 128/62  Pulse: 66 64  Resp: 18 17  Temp:  (!) 97.1 F (36.2 C)  SpO2: 94% 100%     General exam: Appears comfortable without acute distress Respiratory system: Clear to auscultation. Respiratory effort normal. No respiratory distress. No conversational dyspnea.  Cardiovascular system: S1 & S2 heard, RRR. No murmurs. No pedal edema. Gastrointestinal system: Abdomen is nondistended, soft and nontender. Normal bowel sounds heard. Central nervous system: Alert without focal neurologic deficit Extremities: Symmetric in appearance  Skin: No rashes, lesions or ulcers on exposed skin  Psychiatry: With dementia  The results of significant diagnostics from this hospitalization (including imaging, microbiology, ancillary and laboratory) are listed below for reference.     Microbiology: Recent Results (from the past 240  hour(s))  Respiratory Panel by RT PCR (Flu A&B, Covid) - Nasopharyngeal Swab     Status: None   Collection Time: 01/15/20  8:21 PM   Specimen: Nasopharyngeal Swab  Result Value Ref Range Status   SARS Coronavirus 2 by RT PCR NEGATIVE NEGATIVE Final    Comment: (NOTE) SARS-CoV-2 target nucleic acids are NOT DETECTED. The SARS-CoV-2 RNA is generally detectable in upper respiratoy specimens during the acute phase of infection. The lowest concentration of SARS-CoV-2 viral copies this assay can detect  is 131 copies/mL. A negative result does not preclude SARS-Cov-2 infection and should not be used as the sole basis for treatment or other patient management decisions. A negative result may occur with  improper specimen collection/handling, submission of specimen other than nasopharyngeal swab, presence of viral mutation(s) within the areas targeted by this assay, and inadequate number of viral copies (<131 copies/mL). A negative result must be combined with clinical observations, patient history, and epidemiological information. The expected result is Negative. Fact Sheet for Patients:  PinkCheek.be Fact Sheet for Healthcare Providers:  GravelBags.it This test is not yet ap proved or cleared by the [place] FDA and  has been authorized for detection and/or diagnosis of SARS-CoV-2 by FDA under an Emergency Use Authorization (EUA). This EUA will remain  in effect (meaning this test can be used) for the duration of the COVID-19 declaration under Section 564(b)(1) of the Act, 21 U.S.C. section 360bbb-3(b)(1), unless the authorization is terminated or revoked sooner.    Influenza A by PCR NEGATIVE NEGATIVE Final   Influenza B by PCR NEGATIVE NEGATIVE Final    Comment: (NOTE) The Xpert Xpress SARS-CoV-2/FLU/RSV assay is intended as an aid in  the diagnosis of influenza from Nasopharyngeal swab specimens and  should not be used as a sole basis for treatment. Nasal washings and  aspirates are unacceptable for Xpert Xpress SARS-CoV-2/FLU/RSV  testing. Fact Sheet for Patients: PinkCheek.be Fact Sheet for Healthcare Providers: GravelBags.it This test is not yet approved or cleared by the [place] FDA and  has been authorized for detection and/or diagnosis of SARS-CoV-2 by  FDA under an Emergency Use Authorization (EUA). This EUA will remain  in effect (meaning this  test can be used) for the duration of the  Covid-19 declaration under Section 564(b)(1) of the Act, 21  U.S.C. section 360bbb-3(b)(1), unless the authorization is  terminated or revoked. Performed at [facility], [place] [address] [place], [place] [address]   C difficile quick scan w PCR reflex     Status: None   Collection Time: 01/16/20 12:15 PM   Specimen: STOOL  Result Value Ref Range Status   C Diff antigen NEGATIVE NEGATIVE Final   C Diff toxin NEGATIVE NEGATIVE Final   C Diff interpretation No C. difficile detected.  Final    Comment: Performed at [facility], [place] [address] [place], [place] [address]     Labs: BNP (last 3 results) No results for input(s): BNP in the last 8760 hours. Basic Metabolic Panel: Recent Labs  Lab 01/15/20 1630 01/16/20 0102 01/16/20 1043 01/18/20 0825  NA 139  --  138 133*  K 5.2*  --  4.4 4.1  CL 106  --  110 107  CO2 23  --  19* 22  GLUCOSE 134*  --  190* 170*  BUN 24*  --  19 19  CREATININE 1.59*  --  1.31* 1.39*  CALCIUM 9.5  --  8.5* 8.6*  MG  --  1.9  --   --   PHOS  --  2.9  --   --    Liver Function Tests: Recent Labs  Lab 01/15/20 1630  AST 20  ALT 14  ALKPHOS 109  BILITOT 1.8*  PROT 7.1  ALBUMIN 4.1   Recent Labs  Lab 01/15/20 1630  LIPASE 25   No results for input(s): AMMONIA in the last 168 hours. CBC: Recent Labs  Lab 01/15/20 1630 01/16/20 0102 01/16/20 1043  WBC 14.0* 9.4 8.1  NEUTROABS 12.1*  --   --   HGB 10.9* 9.5* 9.0*  HCT 34.4* 29.7* 28.3*  MCV 86.4 86.3 87.1  PLT 273 238 219   Cardiac Enzymes: No results for input(s): CKTOTAL, CKMB, CKMBINDEX, TROPONINI in the last 168 hours. BNP: Invalid input(s): POCBNP CBG: Recent Labs  Lab 01/17/20 2007 01/17/20 2356 01/18/20 0401 01/18/20 0752 01/18/20 1156  GLUCAP 142* 145* 132* 153* 158*   D-Dimer No results for input(s): DDIMER in the last 72 hours. Hgb A1c Recent Labs    01/16/20 0102  HGBA1C 6.7*   Lipid Profile No  results for input(s): CHOL, HDL, LDLCALC, TRIG, CHOLHDL, LDLDIRECT in the last 72 hours. Thyroid function studies Recent Labs    01/16/20 0102  TSH 15.932*   Anemia work up Recent Labs    01/16/20 0102  VITAMINB12 241  FOLATE 15.1  FERRITIN 21  TIBC 382  IRON 35  RETICCTPCT 1.1   Urinalysis    Component Value Date/Time   COLORURINE YELLOW 02/05/2019 1544   APPEARANCEUR CLEAR 02/05/2019 1544   LABSPEC 1.020 02/05/2019 1544   PHURINE 5.5 02/05/2019 1544   GLUCOSEU NEGATIVE 02/05/2019 1544   HGBUR NEGATIVE 02/05/2019 1544   HGBUR negative 01/30/2009 1506   BILIRUBINUR NEGATIVE 02/05/2019 1544   BILIRUBINUR Neg 11/09/2012 0901   KETONESUR NEGATIVE 02/05/2019 1544   PROTEINUR NEGATIVE 04/04/2018 1538   UROBILINOGEN 0.2 02/05/2019 1544   NITRITE NEGATIVE 02/05/2019 1544   LEUKOCYTESUR NEGATIVE 02/05/2019 1544   Sepsis Labs Invalid input(s): PROCALCITONIN,  WBC,  LACTICIDVEN Microbiology Recent Results (from the past 240 hour(s))  Respiratory Panel by RT PCR (Flu A&B, Covid) - Nasopharyngeal Swab     Status: None   Collection Time: 01/15/20  8:21 PM   Specimen: Nasopharyngeal Swab  Result Value Ref Range Status   SARS Coronavirus 2 by RT PCR NEGATIVE NEGATIVE Final    Comment: (NOTE) SARS-CoV-2 target nucleic acids are NOT DETECTED. The SARS-CoV-2 RNA is generally detectable in upper respiratoy specimens during the acute phase of infection. The lowest concentration of SARS-CoV-2 viral copies this assay can detect is 131 copies/mL. A negative result does not preclude SARS-Cov-2 infection and should not be used as the sole basis for treatment or other patient management decisions. A negative result may occur with  improper specimen collection/handling, submission of specimen other than nasopharyngeal swab, presence of viral mutation(s) within the areas targeted by this assay, and inadequate number of viral copies (<131 copies/mL). A negative result must be combined with  clinical observations, patient history, and epidemiological information. The expected result is Negative. Fact Sheet for Patients:  PinkCheek.be Fact Sheet for Healthcare Providers:  GravelBags.it This test is not yet ap proved or cleared by the [place] FDA and  has been authorized for detection and/or diagnosis of SARS-CoV-2 by FDA under an Emergency Use Authorization (EUA). This EUA will remain  in effect (meaning this test can be used) for the duration of the COVID-19 declaration under Section 564(b)(1) of the  Act, 21 U.S.C. section 360bbb-3(b)(1), unless the authorization is terminated or revoked sooner.    Influenza A by PCR NEGATIVE NEGATIVE Final   Influenza B by PCR NEGATIVE NEGATIVE Final    Comment: (NOTE) The Xpert Xpress SARS-CoV-2/FLU/RSV assay is intended as an aid in  the diagnosis of influenza from Nasopharyngeal swab specimens and  should not be used as a sole basis for treatment. Nasal washings and  aspirates are unacceptable for Xpert Xpress SARS-CoV-2/FLU/RSV  testing. Fact Sheet for Patients: PinkCheek.be Fact Sheet for Healthcare Providers: GravelBags.it This test is not yet approved or cleared by the [place] FDA and  has been authorized for detection and/or diagnosis of SARS-CoV-2 by  FDA under an Emergency Use Authorization (EUA). This EUA will remain  in effect (meaning this test can be used) for the duration of the  Covid-19 declaration under Section 564(b)(1) of the Act, 21  U.S.C. section 360bbb-3(b)(1), unless the authorization is  terminated or revoked. Performed at [facility], [place] [address] [place], [place] [address]   C difficile quick scan w PCR reflex     Status: None   Collection Time: 01/16/20 12:15 PM   Specimen: STOOL  Result Value Ref Range Status   C Diff antigen NEGATIVE NEGATIVE Final   C Diff toxin  NEGATIVE NEGATIVE Final   C Diff interpretation No C. difficile detected.  Final    Comment: Performed at [facility], [place] [address] [place], [place] [address]     Patient was seen and examined on the day of discharge and was found to be in stable condition. Time coordinating discharge: 40 minutes including assessment and coordination of care, as well as examination of the patient.   SIGNED:  Dessa Phi, DO [facility] 01/18/2020, 12:27 PM

## 2020-01-18 NOTE — TOC Progression Note (Signed)
Transition of Care [facility]) - Progression Note    Patient Details  Name: Janet Steele MRN: [id number] Date of Birth: 1929-06-24  Transition of Care [facility]) CM/SW [place], [place] Phone Number: [phone] 01/18/2020, 11:53 AM  Clinical Narrative:     PASRR number received : [id number] H  Bed offers at [facility] and [facility].   CSW spoke with  patient's sons [name] and Coralyn Mark on conference call together, they report MD informed them not to go to [facility] so they will now choose [facility].   CSW called admissions with [facility] at [phone] they report once covid test comes back from today, they can accept patient.   Pending covid test results for dc to [facility]. RN made aware.    Expected Discharge Plan: [place] Barriers to Discharge: Continued Medical Work up  Expected Discharge Plan and Services Expected Discharge Plan: [place] Choice: [place] arrangements for the past 2 months: Single Family Home                                       Social Determinants of Health (SDOH) Interventions    Readmission Risk Interventions No flowsheet data found.

## 2020-01-18 NOTE — Progress Notes (Signed)
Nutrition Follow-up  DOCUMENTATION CODES:   Not applicable  INTERVENTION:   -D/c Boost Breeze po TID, each supplement provides 250 kcal and 9 grams of protein -Ensure Enlive po BID, each supplement provides 350 kcal and 20 grams of protein -Magic cup TID with meals, each supplement provides 290 kcal and 9 grams of protein -Continue MVI with minerals daily  NUTRITION DIAGNOSIS:   Inadequate oral intake related to altered GI function as evidenced by meal completion < 50%.  Ongoing  GOAL:   Patient will meet greater than or equal to 90% of their needs  Progressing   MONITOR:   PO intake, Supplement acceptance, Diet advancement, Labs, Weight trends, Skin, I & O's  REASON FOR ASSESSMENT:   Consult Assessment of nutrition requirement/status  ASSESSMENT:   Janet Steele is a 86 y.o. female with medical history significant of CAD sp CABG x2 2003, HTN, HLD, DM 2, symptomatic bradycardia sp pacemaker carotid bruit, chronic diastolic CHF, GERD, esophageal stricture, hypothyroidism history of CVA in 2018 with intermittent aphasia dementia with occasional confusion.  2/11- advanced to soft diet  Reviewed I/O's: +569 ml x 24 hours and +2.7 L since admission  UOP: 150 ml x 24 hours  Per chart review, pt remains with confusion. She has been advanced to a soft diet, consuming 50% of meals. Pt has been consuming Boost Breeze supplements. RD will adjust supplement order to Ensure Enlive, due to increased nutritional density.   Per CSW notes, plan to d/c to SNF on discharge.   Labs reviewed: CBGS: 119-177 (inpatient orders for glycemic control are 0-9 units insulin aspart every 4 hours).   Diet Order:   Diet Order            DIET SOFT Room service appropriate? Yes with Assist; Fluid consistency: Thin  Diet effective now              EDUCATION NEEDS:   Education needs have been addressed  Skin:  Skin Assessment: Reviewed RN Assessment  Last BM:  01/17/20  Height:   Ht  Readings from Last 1 Encounters:  01/15/20 5' (1.524 m)    Weight:   Wt Readings from Last 1 Encounters:  01/18/20 49.4 kg    Ideal Body Weight:  45.5 kg  BMI:  Body mass index is 21.27 kg/m.  Estimated Nutritional Needs:   Kcal:  1350-1550  Protein:  55-70 grams  Fluid:  > 1.3 L    Loistine Chance, RD, LDN, [place] Registered Dietitian II Certified Diabetes Care and Education Specialist Please refer to [facility] for RD and/or RD on-call/weekend/after hours pager

## 2020-01-18 NOTE — Plan of Care (Signed)
  Problem: Activity: Goal: Risk for activity intolerance will decrease Outcome: Progressing   Problem: Safety: Goal: Ability to remain free from injury will improve Outcome: Progressing   

## 2020-01-19 DIAGNOSIS — E860 Dehydration: Secondary | ICD-10-CM | POA: Diagnosis not present

## 2020-01-19 DIAGNOSIS — M6281 Muscle weakness (generalized): Secondary | ICD-10-CM | POA: Diagnosis not present

## 2020-01-19 DIAGNOSIS — R402411 Glasgow coma scale score 13-15, in the field [EMT or ambulance]: Secondary | ICD-10-CM | POA: Diagnosis not present

## 2020-01-19 DIAGNOSIS — Y999 Unspecified external cause status: Secondary | ICD-10-CM | POA: Diagnosis not present

## 2020-01-19 DIAGNOSIS — M2550 Pain in unspecified joint: Secondary | ICD-10-CM | POA: Diagnosis not present

## 2020-01-19 DIAGNOSIS — Z7401 Bed confinement status: Secondary | ICD-10-CM | POA: Diagnosis not present

## 2020-01-19 DIAGNOSIS — E038 Other specified hypothyroidism: Secondary | ICD-10-CM | POA: Diagnosis not present

## 2020-01-19 DIAGNOSIS — K219 Gastro-esophageal reflux disease without esophagitis: Secondary | ICD-10-CM | POA: Diagnosis not present

## 2020-01-19 DIAGNOSIS — R404 Transient alteration of awareness: Secondary | ICD-10-CM | POA: Diagnosis not present

## 2020-01-19 DIAGNOSIS — K529 Noninfective gastroenteritis and colitis, unspecified: Secondary | ICD-10-CM | POA: Diagnosis not present

## 2020-01-19 DIAGNOSIS — R58 Hemorrhage, not elsewhere classified: Secondary | ICD-10-CM | POA: Diagnosis not present

## 2020-01-19 DIAGNOSIS — R262 Difficulty in walking, not elsewhere classified: Secondary | ICD-10-CM | POA: Diagnosis not present

## 2020-01-19 DIAGNOSIS — Z87891 Personal history of nicotine dependence: Secondary | ICD-10-CM | POA: Diagnosis not present

## 2020-01-19 DIAGNOSIS — S0101XA Laceration without foreign body of scalp, initial encounter: Secondary | ICD-10-CM | POA: Diagnosis not present

## 2020-01-19 DIAGNOSIS — Z20822 Contact with and (suspected) exposure to covid-19: Secondary | ICD-10-CM | POA: Diagnosis not present

## 2020-01-19 DIAGNOSIS — R0689 Other abnormalities of breathing: Secondary | ICD-10-CM | POA: Diagnosis not present

## 2020-01-19 DIAGNOSIS — R279 Unspecified lack of coordination: Secondary | ICD-10-CM | POA: Diagnosis not present

## 2020-01-19 DIAGNOSIS — W19XXXA Unspecified fall, initial encounter: Secondary | ICD-10-CM | POA: Diagnosis not present

## 2020-01-19 DIAGNOSIS — Z743 Need for continuous supervision: Secondary | ICD-10-CM | POA: Diagnosis not present

## 2020-01-19 DIAGNOSIS — A0472 Enterocolitis due to Clostridium difficile, not specified as recurrent: Secondary | ICD-10-CM | POA: Diagnosis not present

## 2020-01-19 DIAGNOSIS — Y939 Activity, unspecified: Secondary | ICD-10-CM | POA: Diagnosis not present

## 2020-01-19 DIAGNOSIS — R4701 Aphasia: Secondary | ICD-10-CM | POA: Diagnosis not present

## 2020-01-19 DIAGNOSIS — F0390 Unspecified dementia without behavioral disturbance: Secondary | ICD-10-CM | POA: Diagnosis not present

## 2020-01-19 DIAGNOSIS — D649 Anemia, unspecified: Secondary | ICD-10-CM | POA: Diagnosis not present

## 2020-01-19 DIAGNOSIS — Z8673 Personal history of transient ischemic attack (TIA), and cerebral infarction without residual deficits: Secondary | ICD-10-CM | POA: Diagnosis not present

## 2020-01-19 DIAGNOSIS — Z9181 History of falling: Secondary | ICD-10-CM | POA: Diagnosis not present

## 2020-01-19 DIAGNOSIS — N179 Acute kidney failure, unspecified: Secondary | ICD-10-CM | POA: Diagnosis not present

## 2020-01-19 DIAGNOSIS — S0003XA Contusion of scalp, initial encounter: Secondary | ICD-10-CM | POA: Diagnosis not present

## 2020-01-19 DIAGNOSIS — I7389 Other specified peripheral vascular diseases: Secondary | ICD-10-CM | POA: Diagnosis not present

## 2020-01-19 DIAGNOSIS — I10 Essential (primary) hypertension: Secondary | ICD-10-CM | POA: Diagnosis not present

## 2020-01-19 DIAGNOSIS — N1831 Chronic kidney disease, stage 3a: Secondary | ICD-10-CM | POA: Diagnosis not present

## 2020-01-19 DIAGNOSIS — Z950 Presence of cardiac pacemaker: Secondary | ICD-10-CM | POA: Diagnosis not present

## 2020-01-19 DIAGNOSIS — F339 Major depressive disorder, recurrent, unspecified: Secondary | ICD-10-CM | POA: Diagnosis not present

## 2020-01-19 DIAGNOSIS — I5032 Chronic diastolic (congestive) heart failure: Secondary | ICD-10-CM | POA: Diagnosis not present

## 2020-01-19 DIAGNOSIS — Y9210 Unspecified residential institution as the place of occurrence of the external cause: Secondary | ICD-10-CM | POA: Diagnosis not present

## 2020-01-19 DIAGNOSIS — I2510 Atherosclerotic heart disease of native coronary artery without angina pectoris: Secondary | ICD-10-CM | POA: Diagnosis not present

## 2020-01-19 DIAGNOSIS — S0990XA Unspecified injury of head, initial encounter: Secondary | ICD-10-CM | POA: Diagnosis not present

## 2020-01-19 DIAGNOSIS — R2681 Unsteadiness on feet: Secondary | ICD-10-CM | POA: Diagnosis not present

## 2020-01-19 DIAGNOSIS — E7849 Other hyperlipidemia: Secondary | ICD-10-CM | POA: Diagnosis not present

## 2020-01-19 DIAGNOSIS — Z951 Presence of aortocoronary bypass graft: Secondary | ICD-10-CM | POA: Diagnosis not present

## 2020-01-19 DIAGNOSIS — W01198A Fall on same level from slipping, tripping and stumbling with subsequent striking against other object, initial encounter: Secondary | ICD-10-CM | POA: Diagnosis not present

## 2020-01-19 DIAGNOSIS — E039 Hypothyroidism, unspecified: Secondary | ICD-10-CM | POA: Diagnosis not present

## 2020-01-19 DIAGNOSIS — E1142 Type 2 diabetes mellitus with diabetic polyneuropathy: Secondary | ICD-10-CM | POA: Diagnosis not present

## 2020-01-19 DIAGNOSIS — E119 Type 2 diabetes mellitus without complications: Secondary | ICD-10-CM | POA: Diagnosis not present

## 2020-01-19 LAB — GLUCOSE, CAPILLARY
Glucose-Capillary: 219 mg/dL — ABNORMAL HIGH (ref 70–99)
Glucose-Capillary: 175 mg/dL — ABNORMAL HIGH (ref 70–99)
Glucose-Capillary: 155 mg/dL — ABNORMAL HIGH (ref 70–99)
Glucose-Capillary: 203 mg/dL — ABNORMAL HIGH (ref 70–99)

## 2020-01-19 LAB — BASIC METABOLIC PANEL
Anion gap: 9 (ref 5–15)
BUN: 24 mg/dL — ABNORMAL HIGH (ref 8–23)
CO2: 22 mmol/L (ref 22–32)
Calcium: 9 mg/dL (ref 8.9–10.3)
Chloride: 106 mmol/L (ref 98–111)
Creatinine, Ser: 1.52 mg/dL — ABNORMAL HIGH (ref 0.44–1.00)
GFR calc Af Amer: 35 mL/min — ABNORMAL LOW (ref >60)
GFR calc non Af Amer: 30 mL/min — ABNORMAL LOW (ref >60)
Glucose, Bld: 174 mg/dL — ABNORMAL HIGH (ref 70–99)
Potassium: 4 mmol/L (ref 3.5–5.1)
Sodium: 137 mmol/L (ref 135–145)

## 2020-01-19 NOTE — TOC Progression Note (Signed)
Transition of Care [facility]) - Progression Note    Patient Details  Name: Janet Steele MRN: [id number] Date of Birth: 03/11/1929  Transition of Care [facility]) CM/SW [place], LCSW Phone Number: [phone] 01/19/2020, 9:06 AM  Clinical Narrative:     CSW was alerted that patient's COVID test is back and called [facility], waiting on call back for disposition plan.  CSW will continue to monitor for discharge planning needs.  Expected Discharge Plan: [place] Barriers to Discharge: Continued Medical Work up  Expected Discharge Plan and Services Expected Discharge Plan: [place] Choice: [place] arrangements for the past 2 months: Single Family Home Expected Discharge Date: 01/18/20                                     Social Determinants of Health (SDOH) Interventions    Readmission Risk Interventions No flowsheet data found.

## 2020-01-19 NOTE — Progress Notes (Signed)
  PROGRESS NOTE  Patient stable for discharge to SNF today. Repeat COVID negative last night.    Dessa Phi, DO [facility] 01/19/2020, 9:00 AM  Available via Epic secure chat 7am-7pm After these hours, please refer to coverage provider listed on [email or web address]

## 2020-01-19 NOTE — TOC Transition Note (Signed)
Transition of Care [facility]) - CM/SW Discharge Note   Patient Details  Name: Janet Steele MRN: [id number] Date of Birth: [birth year]  Transition of Care [facility] CM/SW Contact:  Bary Castilla, LCSW Phone Number: [phone] 01/19/2020, 10:31 AM   Clinical Narrative:     Patient will DC to:?[place] date:?01/19/20 Family notified:?Son Kathe Becton by: Corey Harold   Per MD patient ready for DC to [facility]. RN, patient, patient's family, and facility notified of DC. Discharge Summary sent to facility. RN given number for report [place] packet on chart. Ambulance transport requested for patient.   CSW signing off.   [place], [place] [phone]   Final next level of care: Skilled Nursing Facility Barriers to Discharge: No Barriers Identified   Patient Goals and CMS Choice   CMS Medicare.gov Compare Post Acute Care list provided to:: Patient Represenative (must comment)(son Coralyn Mark) Choice offered to / list presented to : Adult Children  Discharge Placement              Patient chooses bed at: [facility] Patient to be transferred to facility by: [place] Name of family member notified: son Coralyn Mark Patient and family notified of of transfer: 01/19/20  Discharge Plan and Services     Post Acute Care Choice: [place]                               Social Determinants of Health (SDOH) Interventions     Readmission Risk Interventions No flowsheet data found.

## 2020-01-20 LAB — GI PATHOGEN PANEL BY PCR, STOOL

## 2020-01-21 ENCOUNTER — Telehealth: Payer: Self-pay | Admitting: *Deleted

## 2020-01-21 DIAGNOSIS — N179 Acute kidney failure, unspecified: Secondary | ICD-10-CM | POA: Diagnosis not present

## 2020-01-21 DIAGNOSIS — M6281 Muscle weakness (generalized): Secondary | ICD-10-CM | POA: Diagnosis not present

## 2020-01-21 DIAGNOSIS — K529 Noninfective gastroenteritis and colitis, unspecified: Secondary | ICD-10-CM | POA: Diagnosis not present

## 2020-01-21 DIAGNOSIS — E860 Dehydration: Secondary | ICD-10-CM | POA: Diagnosis not present

## 2020-01-21 NOTE — Telephone Encounter (Signed)
Pt was on TCM report admitted 01/15/20 for colitis and acute kidney injury. Pt D/C 01/18/20 to  [facility] .   Service: Skilled Nursing

## 2020-01-24 ENCOUNTER — Emergency Department
Admission: EM | Admit: 2020-01-24 | Discharge: 2020-01-24 | Disposition: A | Discharge status: Skilled Nursing Facility | Payer: Medicare Other | Attending: Emergency Medicine | Admitting: Emergency Medicine

## 2020-01-24 ENCOUNTER — Emergency Department: Payer: Medicare Other

## 2020-01-24 ENCOUNTER — Other Ambulatory Visit: Payer: Self-pay

## 2020-01-24 DIAGNOSIS — Z951 Presence of aortocoronary bypass graft: Secondary | ICD-10-CM | POA: Insufficient documentation

## 2020-01-24 DIAGNOSIS — S0003XA Contusion of scalp, initial encounter: Secondary | ICD-10-CM | POA: Diagnosis not present

## 2020-01-24 DIAGNOSIS — Y9210 Unspecified residential institution as the place of occurrence of the external cause: Secondary | ICD-10-CM | POA: Diagnosis not present

## 2020-01-24 DIAGNOSIS — W01198A Fall on same level from slipping, tripping and stumbling with subsequent striking against other object, initial encounter: Secondary | ICD-10-CM | POA: Insufficient documentation

## 2020-01-24 DIAGNOSIS — S0101XA Laceration without foreign body of scalp, initial encounter: Secondary | ICD-10-CM | POA: Insufficient documentation

## 2020-01-24 DIAGNOSIS — I10 Essential (primary) hypertension: Secondary | ICD-10-CM | POA: Diagnosis not present

## 2020-01-24 DIAGNOSIS — I2510 Atherosclerotic heart disease of native coronary artery without angina pectoris: Secondary | ICD-10-CM | POA: Insufficient documentation

## 2020-01-24 DIAGNOSIS — Z87891 Personal history of nicotine dependence: Secondary | ICD-10-CM | POA: Diagnosis not present

## 2020-01-24 DIAGNOSIS — E119 Type 2 diabetes mellitus without complications: Secondary | ICD-10-CM | POA: Diagnosis not present

## 2020-01-24 DIAGNOSIS — Y999 Unspecified external cause status: Secondary | ICD-10-CM | POA: Insufficient documentation

## 2020-01-24 DIAGNOSIS — Z20822 Contact with and (suspected) exposure to covid-19: Secondary | ICD-10-CM | POA: Diagnosis not present

## 2020-01-24 DIAGNOSIS — Y939 Activity, unspecified: Secondary | ICD-10-CM | POA: Insufficient documentation

## 2020-01-24 DIAGNOSIS — E039 Hypothyroidism, unspecified: Secondary | ICD-10-CM | POA: Diagnosis not present

## 2020-01-24 DIAGNOSIS — S0990XA Unspecified injury of head, initial encounter: Secondary | ICD-10-CM | POA: Diagnosis not present

## 2020-01-24 MED ORDER — ACETAMINOPHEN 500 MG PO TABS
1000.0000 mg | ORAL_TABLET | Freq: Once | ORAL | Status: AC
  Administered 2020-01-24: 1000 mg via ORAL
  Filled 2020-01-24: qty 2

## 2020-01-24 NOTE — ED Provider Notes (Signed)
[facility] Emergency Department Provider Note       Time seen: ----------------------------------------- 5:09 PM on 01/24/2020 -----------------------------------------   I have reviewed the triage vital signs and the nursing notes.  HISTORY   Chief Complaint Fall    HPI Janet Steele is a 86 y.o. female with a history of anemia, anxiety, coronary artery disease, arthritis, GERD, hyperlipidemia, hypertension, cardiomyopathy who presents to the ED for a mechanical fall.  Patient presents from a nursing facility after she tripped and fell.  She struck the back of her head on part of her walker.  She denies loss of consciousness.  Past Medical History:  Diagnosis Date  . Anemia   . Anxiety   . Arthritis    "fingers" (05/19/2016  . CAD (coronary artery disease)    a. s/p CABG 1982 b. redo CABG 2003. c. [place] despite med rx 05/2016: successful but complicated PCI of PDA beyond graft site, c/b localized dissection requiring overlapping stent.  . Carotid bruit    a. Duplex 01/2015: patent vessels, 1-39% BICA, f/u PRN recommened.  . Colon polyp    adenomatous  . Colon, diverticulosis   . Degenerative joint disease   . Diastolic dysfunction   . Esophageal stricture   . GERD (gastroesophageal reflux disease)   . Hiatal hernia   . History of blood transfusion    "related to my bypass"  . Hyperlipidemia   . Hypertension   . Hypothyroidism   . Ischemic cardiomyopathy    a. Cath 05/2016: EF 40% with severe inferior wall HK, suspect hibernating myocardium.  . Myocardial infarction ([place]) 1977; 1982  . Osteoarthrosis, unspecified whether generalized or localized, unspecified site   . Presence of permanent cardiac pacemaker   . PVD (peripheral vascular disease) ([place])   . Type II diabetes mellitus ([place])   . Unspecified hearing loss     Patient Active Problem List   Diagnosis Date Noted  . AKI (acute kidney injury) ([place]) 01/15/2020  . Dehydration 01/15/2020   . Hyperkalemia 01/15/2020  . Colitis 01/15/2020  . Headache 07/21/2019  . Lower abdominal pain 02/05/2019  . Other headache syndrome 08/14/2018  . Depression 08/14/2018  . Weight loss 06/05/2018  . Stroke ([place]) 11/04/2017  . Weakness 11/03/2017  . History of cerebrovascular accident (CVA) with residual deficit 11/03/2017  . Chronic back pain 07/04/2017  . PAD (peripheral artery disease) ([place]) 01/17/2017  . Overactive bladder 12/17/2016  . Anemia 05/20/2016  . Coronary artery disease involving coronary bypass graft of native heart without angina pectoris 05/19/2016  . Numbness and tingling of foot 12/31/2015  . MCI (mild cognitive impairment) with memory loss 11/18/2015  . Facial tic 05/07/2015  . Sick sinus syndrome ([place]) 02/26/2015  . Cervical dystonia 12/04/2014  . Chronic motor tic 09/12/2014  . Pacemaker-Medtronic 07/07/2012  . GERD (gastroesophageal reflux disease) 07/05/2012  . Cough 08/04/2010  . Abnormal involuntary movement 12/17/2009  . Disturbance in sleep behavior 12/11/2009  . CAD, ARTERY BYPASS GRAFT 09/24/2009  . CAROTID BRUIT 09/24/2009  . Diabetes ([place]) 01/30/2009  . DYSPHAGIA 06/19/2008  . Anxiety 02/14/2008  . EROSIVE ESOPHAGITIS 02/14/2008  . CONSTIPATION, CHRONIC 02/14/2008  . DEGENERATIVE JOINT DISEASE 02/14/2008  . HEARING LOSS 01/04/2008  . PVD 01/04/2008  . Hypothyroidism 09/25/2007  . HYPERLIPIDEMIA 09/25/2007  . Essential hypertension 09/07/2007  . Diabetic polyneuropathy ([place]) 03/16/2007  . Stricture and stenosis of esophagus 03/16/2007  . HIATAL HERNIA 02/16/2007  . HEMORRHOIDS 12/31/1997  . DIVERTICULOSIS, COLON 12/31/1997  Past Surgical History:  Procedure Laterality Date  . ABDOMINAL HYSTERECTOMY    . APPENDECTOMY    . BACK SURGERY    . CARDIAC CATHETERIZATION N/A 05/19/2016   Procedure: Left Heart Cath and Cors/Grafts Angiography;  Surgeon: Belva Crome, MD;  Location: [place] [facility];  Service: Cardiovascular;  Laterality:  N/A;  . CARDIAC CATHETERIZATION N/A 05/19/2016   Procedure: Coronary Stent Intervention;  Surgeon: Belva Crome, MD;  Location: [place] [facility];  Service: Cardiovascular;  Laterality: N/A;  . CATARACT EXTRACTION W/ INTRAOCULAR LENS  IMPLANT, BILATERAL Bilateral   . CHOLECYSTECTOMY OPEN    . COLONOSCOPY    . CORONARY ANGIOPLASTY WITH STENT PLACEMENT  05/19/2016   "2 stents?"  . CORONARY ARTERY BYPASS GRAFT  1982; 2003   x4(1982),02-2002 CABG X2  . ESOPHAGOGASTRODUODENOSCOPY (EGD) WITH ESOPHAGEAL DILATION  "2-3 times"  . INSERT / REPLACE / REMOVE PACEMAKER    . LEFT HEART CATHETERIZATION WITH CORONARY/GRAFT ANGIOGRAM N/A 03/05/2014   Procedure: LEFT HEART CATHETERIZATION WITH Beatrix Fetters;  Surgeon: Sinclair Grooms, MD;  Location: [facility] CATH LAB;  Service: Cardiovascular;  Laterality: N/A;  . LUMBAR [place] SURGERY  01-2000  . OOPHORECTOMY Bilateral   . PERMANENT PACEMAKER INSERTION N/A 07/06/2012   MDT Adapta L implanted by Dr Rayann Heman for symptomatic bradycardia  . TONSILLECTOMY  1930s    Allergies Morphine, Penicillins, Prednisone, Sulfonamide derivatives, Codeine, Hydrocodone-acetaminophen, Meperidine hcl, Metoclopramide hcl, Metoprolol succinate, Moxifloxacin, Oxycodone-aspirin, Pentazocine lactate, and Pioglitazone  Social History Social History   Tobacco Use  . Smoking status: Former Smoker    Packs/day: 0.50    Years: 30.00    Pack years: 15.00    Types: Cigarettes    Quit date: 07/05/1976    Years since quitting: 43.5  . Smokeless tobacco: Never Used  Substance Use Topics  . Alcohol use: No    Alcohol/week: 3.0 standard drinks    Types: 3 Glasses of wine per week    Comment: "not anymore, I haven't in I don't know years"  . Drug use: No    Review of Systems Constitutional: Negative for fever. Cardiovascular: Negative for chest pain. Respiratory: Negative for shortness of breath. Gastrointestinal: Negative for abdominal pain, vomiting and  diarrhea. Musculoskeletal: Negative for back pain. Skin: Positive for scalp laceration Neurological: Positive for headache  All systems negative/normal/unremarkable except as stated in the HPI  ____________________________________________   PHYSICAL EXAM:  VITAL SIGNS: ED Triage Vitals [01/24/20 1705]  Enc Vitals Group     BP      Pulse      Resp      Temp      Temp src      SpO2 99 %     Weight      Height      Head Circumference      Peak Flow      Pain Score      Pain Loc      Pain Edu?      Excl. in [place]?     Constitutional: Alert and oriented. Well appearing and in no distress. Eyes: Conjunctivae are normal. Normal extraocular movements. ENT      Head: Normocephalic, 2 cm posterior scalp laceration is noted on the left side      Nose: No congestion/rhinnorhea.      Mouth/Throat: Mucous membranes are moist.      Neck: No stridor. Cardiovascular: Normal rate, regular rhythm. No murmurs, rubs, or gallops. Respiratory: Normal respiratory effort without tachypnea nor  retractions. Breath sounds are clear and equal bilaterally. No wheezes/rales/rhonchi. Musculoskeletal: Nontender with normal range of motion in extremities. No lower extremity tenderness nor edema. Neurologic:  Normal speech and language. No gross focal neurologic deficits are appreciated.  Skin: 2 cm posterior scalp laceration with mild bleeding Psychiatric: Mood and affect are normal. Speech and behavior are normal.  ____________________________________________  ED COURSE:  As part of my medical decision making, I reviewed the following data within the [place] History obtained from family if available, nursing notes, old chart and ekg, as well as notes from prior ED visits. Patient presented for a mechanical fall, we will assess with imaging as indicated at this time.   Marland Kitchen.Laceration Repair  Date/Time: 01/24/2020 5:11 PM Performed by: Earleen Newport, MD Authorized by:  Earleen Newport, MD   Consent:    Consent obtained:  Emergent situation   Consent given by:  Patient Anesthesia (see MAR for exact dosages):    Anesthesia method:  None Laceration details:    Location:  Scalp   Scalp location:  Occipital   Length (cm):  2   Depth (mm):  5 Repair type:    Repair type:  Simple Treatment:    Amount of cleaning:  Standard   Irrigation solution:  Tap water Skin repair:    Repair method:  Staples   Number of staples:  2 Approximation:    Approximation:  Close Post-procedure details:    Dressing:  Open (no dressing)   Patient tolerance of procedure:  Tolerated well, no immediate complications    JAYLYNNE BIRKHEAD was evaluated in Emergency Department on 01/24/2020 for the symptoms described in the history of present illness. She was evaluated in the context of the global COVID-19 pandemic, which necessitated consideration that the patient might be at risk for infection with the SARS-CoV-2 virus that causes COVID-19. Institutional protocols and algorithms that pertain to the evaluation of patients at risk for COVID-19 are in a state of rapid change based on information released by regulatory bodies including the CDC and federal and state organizations. These policies and algorithms were followed during the patient's care in the ED.  ____________________________________________   RADIOLOGY Images were viewed by me  CT head IMPRESSION:  1. Mild left posterior scalp hematoma without skull fracture or  intracranial hemorrhage.  2. Stable moderate diffuse cerebral and cerebellar atrophy and mild  to moderate chronic small vessel white matter ischemic changes in  both cerebral hemispheres.  ____________________________________________   DIFFERENTIAL DIAGNOSIS   Laceration, contusion, subdural, concussion, minor head injury  FINAL ASSESSMENT AND PLAN  Scalp laceration, minor head injury   Plan: The patient had presented for fall with resulting  scalp laceration.  Wound was repaired as dictated above.  Patient's imaging did not reveal any acute process.  She is cleared for outpatient follow-up, advised staple removal in 10 days.   Laurence Aly, MD    Note: This note was generated in part or whole with voice recognition software. Voice recognition is usually quite accurate but there are transcription errors that can and very often do occur. I apologize for any typographical errors that were not detected and corrected.     Earleen Newport, MD 01/24/20 1806

## 2020-01-24 NOTE — ED Triage Notes (Signed)
Pt to ED via [facility] from [facility]. Per EMS pt's roommate states she was getting up to walk and fell and hit the back of her head on the front wheel of the bed. Per EMS bleeding was controlled upon arrival.   Upon arrival pt A&Ox4. Pt presents with posterior lack to base of skull. Bleeding controlled at this time. MD at bedside.

## 2020-01-24 NOTE — ED Notes (Signed)
[facility]  CALLED  FOR  TRANSFER  TO  [place]

## 2020-01-25 ENCOUNTER — Ambulatory Visit: Payer: Medicare Other

## 2020-01-29 ENCOUNTER — Telehealth: Payer: Self-pay

## 2020-01-29 DIAGNOSIS — F0390 Unspecified dementia without behavioral disturbance: Secondary | ICD-10-CM | POA: Diagnosis not present

## 2020-01-29 DIAGNOSIS — N179 Acute kidney failure, unspecified: Secondary | ICD-10-CM | POA: Diagnosis not present

## 2020-01-29 DIAGNOSIS — A0472 Enterocolitis due to Clostridium difficile, not specified as recurrent: Secondary | ICD-10-CM | POA: Diagnosis not present

## 2020-01-29 DIAGNOSIS — M6281 Muscle weakness (generalized): Secondary | ICD-10-CM | POA: Diagnosis not present

## 2020-01-29 NOTE — Telephone Encounter (Signed)
Faxed over approval for staple removal.

## 2020-01-29 NOTE — Telephone Encounter (Signed)
New message    Can physician there at the facility remove staples.

## 2020-02-05 ENCOUNTER — Ambulatory Visit: Payer: Medicare Other

## 2020-02-07 DIAGNOSIS — Z8673 Personal history of transient ischemic attack (TIA), and cerebral infarction without residual deficits: Secondary | ICD-10-CM | POA: Diagnosis not present

## 2020-02-07 DIAGNOSIS — I2510 Atherosclerotic heart disease of native coronary artery without angina pectoris: Secondary | ICD-10-CM | POA: Diagnosis not present

## 2020-02-07 DIAGNOSIS — E7849 Other hyperlipidemia: Secondary | ICD-10-CM | POA: Diagnosis not present

## 2020-02-07 DIAGNOSIS — I7389 Other specified peripheral vascular diseases: Secondary | ICD-10-CM | POA: Diagnosis not present

## 2020-02-11 ENCOUNTER — Other Ambulatory Visit: Payer: Self-pay | Admitting: *Deleted

## 2020-02-11 DIAGNOSIS — I10 Essential (primary) hypertension: Secondary | ICD-10-CM

## 2020-02-11 NOTE — Patient Outreach (Signed)
Member screened for potential [facility] Care Management needs as a benefit of [place] [employer].  Verified in Patient Janet Steele member discharged from [facility] on Friday, March 5th.   Attempted to contact member's son [name] [phone]. No answer. Left HIPAA compliant voicemail message. Attempted to reach member at home number. No answer. Left HIPAA compliant voicemail message to request call back.   Will make referral to [facility] for complex case management. [name] has a medical history of CHF, CAD, HTN, DM, GERD, CVA, dementia.    Marthenia Rolling, MSN-Ed, RN,BSN [place] Acute Care Coordinator [phone] [facility]) [phone]  (Toll free office)

## 2020-02-12 ENCOUNTER — Other Ambulatory Visit: Payer: Self-pay | Admitting: *Deleted

## 2020-02-12 NOTE — Patient Outreach (Signed)
[facility] Post Acute Care Coordinator follow up  Returned telephone call received from Janet Steele (son) at [phone]. Patient identifiers confirmed. Janet Steele member is now home from [facility]. States she is doing well.   Discussed that both writer and [place] has tried to reach Janet Steele without success. Janet Steele states he should be contacted as member and husband often do not answer the phone.   Explained [place] Management services. Janet Steele is agreeable. Writer will email [place] Management contact information and [facility] website information.   Will update [facility] RNCM about conversation with son.   Brylee Berk- son- [place], [place], RN,BSN [place] Acute Care Coordinator [phone] [facility]) [phone]  (Toll free office)

## 2020-02-12 NOTE — Patient Outreach (Signed)
[place] [facility]) Care Management  02/12/2020  Janet Steele 1929/07/12 [id number]   Referral received from post acute care coordinator as member was discharged from SNF on 3/5.  She was admitted to hospital 2/9-2/13 for colitis and went to SNF for rehab.  Noted per chart that she also has history of HTN, CVA, CAD, PAD, GERD, Hypothyroidism, Diabetes, HLD, and degenerative joint disease.  Call placed to member for transition of care assessment, no answer.  HIPAA compliant voice message left for she and her husband.  Will send unsuccessful outreach letter and will plan to follow up within the next 3-4 business days.  Valente David, [place], MSN [place] [phone]

## 2020-02-15 ENCOUNTER — Other Ambulatory Visit: Payer: Self-pay | Admitting: *Deleted

## 2020-02-15 NOTE — Patient Outreach (Signed)
[place] [facility]) Care Management  02/15/2020  Janet Steele 04-17-1929 [id number]   Outreach attempt #2, unsuccessful.    Referral received from post acute care coordinator as member was discharged from SNF on 3/5.  She was admitted to hospital 2/9-2/13 for colitis and went to SNF for rehab.  Noted per chart that she also has history of HTN, CVA, CAD, PAD, GERD, Hypothyroidism, Diabetes, HLD, and degenerative joint disease.  Call was placed to member's son as this care manager was notified that he would be best contact person, no answer.  HIPAA compliant voice message left, will follow up within the next 3-4 business days.  Valente David, [place], MSN [place] [phone]

## 2020-02-18 NOTE — Progress Notes (Signed)
Subjective:    Patient ID: Janet Steele, female    DOB: Feb 28, 1929, 86 y.o.   MRN: [id number]  HPI The patient is here for follow up of their chronic medical problems, including diabetes, hypertension, hypothyroidism, hyperlipidemia, GERD, dementia, h/o CVA, depression, anxiety, insomnia.  Her husband is here with her.  She is taking all of her medications as prescribed.   She was admitted 2/9-2/13 for colitis and went to SNF for rehab.  She was discharged to home 3/5.  She did have to go to the ED while at rehab because she fell and hit her head.  She does have some residual pain from where she hit her head.  Fingers are blue or white - even in the summer.  She denies pain.  She states her feet are bluish in color and cool to touch, which is chronic.  Medications and allergies reviewed with patient and updated if appropriate.  Patient Active Problem List   Diagnosis Date Noted  . AKI (acute kidney injury) ([place]) 01/15/2020  . Hyperkalemia 01/15/2020  . Colitis 01/15/2020  . Headache 07/21/2019  . Lower abdominal pain 02/05/2019  . Other headache syndrome 08/14/2018  . Depression 08/14/2018  . Weight loss 06/05/2018  . Stroke ([place]) 11/04/2017  . Weakness 11/03/2017  . History of cerebrovascular accident (CVA) with residual deficit 11/03/2017  . Chronic back pain 07/04/2017  . PAD (peripheral artery disease) ([place]) 01/17/2017  . Overactive bladder 12/17/2016  . Anemia 05/20/2016  . Coronary artery disease involving coronary bypass graft of native heart without angina pectoris 05/19/2016  . Numbness and tingling of foot 12/31/2015  . MCI (mild cognitive impairment) with memory loss 11/18/2015  . Facial tic 05/07/2015  . Sick sinus syndrome ([place]) 02/26/2015  . Cervical dystonia 12/04/2014  . Chronic motor tic 09/12/2014  . Pacemaker-Medtronic 07/07/2012  . GERD (gastroesophageal reflux disease) 07/05/2012  . Cough 08/04/2010  . Abnormal involuntary movement 12/17/2009  .  Disturbance in sleep behavior 12/11/2009  . CAD, ARTERY BYPASS GRAFT 09/24/2009  . CAROTID BRUIT 09/24/2009  . Diabetes ([place]) 01/30/2009  . DYSPHAGIA 06/19/2008  . Anxiety 02/14/2008  . EROSIVE ESOPHAGITIS 02/14/2008  . CONSTIPATION, CHRONIC 02/14/2008  . DEGENERATIVE JOINT DISEASE 02/14/2008  . HEARING LOSS 01/04/2008  . PVD 01/04/2008  . Hypothyroidism 09/25/2007  . HYPERLIPIDEMIA 09/25/2007  . Essential hypertension 09/07/2007  . Diabetic polyneuropathy ([place]) 03/16/2007  . Stricture and stenosis of esophagus 03/16/2007  . HIATAL HERNIA 02/16/2007  . HEMORRHOIDS 12/31/1997  . DIVERTICULOSIS, COLON 12/31/1997    Current Outpatient Medications on File Prior to Visit  Medication Sig Dispense Refill  . atorvastatin (LIPITOR) 80 MG tablet TAKE 1 TABLET BY MOUTH EVERY DAY (Patient taking differently: Take 80 mg by mouth daily. ) 90 tablet 1  . clopidogrel (PLAVIX) 75 MG tablet TAKE 1 TABLET BY MOUTH EVERY DAY 90 tablet 0  . DULoxetine (CYMBALTA) 60 MG capsule TAKE 1 CAPSULE(60 MG) BY MOUTH DAILY (Patient taking differently: Take 60 mg by mouth daily. ) 90 capsule 0  . gabapentin (NEURONTIN) 100 MG capsule Take 1 capsule (100 mg total) by mouth 2 (two) times daily. 60 capsule 5  . JANUVIA 50 MG tablet Take 50 mg by mouth daily.  0  . levothyroxine (SYNTHROID) 88 MCG tablet TAKE 1 TABLET(88 MCG) BY MOUTH DAILY (Patient taking differently: Take 88 mcg by mouth daily. ) 90 tablet 0  . Melatonin 5 MG TABS Take by mouth.    . metoprolol tartrate (LOPRESSOR) 25 MG  tablet TAKE 1 TABLET BY MOUTH TWICE DAILY (Patient taking differently: Take 25 mg by mouth 2 (two) times daily. ) 180 tablet 0  . nitroGLYCERIN (NITROSTAT) 0.4 MG SL tablet Place 1 tablet (0.4 mg total) under the tongue every 5 (five) minutes as needed. For chest pain 25 tablet 5   No current facility-administered medications on file prior to visit.    Past Medical History:  Diagnosis Date  . Anemia   . Anxiety   . Arthritis     "fingers" (05/19/2016  . CAD (coronary artery disease)    a. s/p CABG 1982 b. redo CABG 2003. c. [place] despite med rx 05/2016: successful but complicated PCI of PDA beyond graft site, c/b localized dissection requiring overlapping stent.  . Carotid bruit    a. Duplex 01/2015: patent vessels, 1-39% BICA, f/u PRN recommened.  . Colon polyp    adenomatous  . Colon, diverticulosis   . Degenerative joint disease   . Diastolic dysfunction   . Esophageal stricture   . GERD (gastroesophageal reflux disease)   . Hiatal hernia   . History of blood transfusion    "related to my bypass"  . Hyperlipidemia   . Hypertension   . Hypothyroidism   . Ischemic cardiomyopathy    a. Cath 05/2016: EF 40% with severe inferior wall HK, suspect hibernating myocardium.  . Myocardial infarction ([place]) 1977; 1982  . Osteoarthrosis, unspecified whether generalized or localized, unspecified site   . Presence of permanent cardiac pacemaker   . PVD (peripheral vascular disease) ([place])   . Type II diabetes mellitus ([place])   . Unspecified hearing loss     Past Surgical History:  Procedure Laterality Date  . ABDOMINAL HYSTERECTOMY    . APPENDECTOMY    . BACK SURGERY    . CARDIAC CATHETERIZATION N/A 05/19/2016   Procedure: Left Heart Cath and Cors/Grafts Angiography;  Surgeon: Belva Crome, MD;  Location: [facility];  Service: Cardiovascular;  Laterality: N/A;  . CARDIAC CATHETERIZATION N/A 05/19/2016   Procedure: Coronary Stent Intervention;  Surgeon: Belva Crome, MD;  Location: [facility];  Service: Cardiovascular;  Laterality: N/A;  . CATARACT EXTRACTION W/ INTRAOCULAR LENS  IMPLANT, BILATERAL Bilateral   . CHOLECYSTECTOMY OPEN    . COLONOSCOPY    . CORONARY ANGIOPLASTY WITH STENT PLACEMENT  05/19/2016   "2 stents?"  . CORONARY ARTERY BYPASS GRAFT  1982; 2003   x4(1982),02-2002 CABG X2  . ESOPHAGOGASTRODUODENOSCOPY (EGD) WITH ESOPHAGEAL DILATION  "2-3 times"  . INSERT / REPLACE / REMOVE PACEMAKER     . LEFT HEART CATHETERIZATION WITH CORONARY/GRAFT ANGIOGRAM N/A 03/05/2014   Procedure: LEFT HEART CATHETERIZATION WITH Beatrix Fetters;  Surgeon: Sinclair Grooms, MD;  Location[facility];  Service: Cardiovascular;  Laterality: N/A;  . LUMBAR [place] SURGERY  01-2000  . OOPHORECTOMY Bilateral   . PERMANENT PACEMAKER INSERTION N/A 07/06/2012   MDT Adapta L implanted by Dr Rayann Heman for symptomatic bradycardia  . TONSILLECTOMY  1930s    Social History   Socioeconomic History  . Marital status: Married    Spouse name: [name]  . Number of children: 3  . Years of education: 21  . Highest education level: Not on file  Occupational History  . Occupation: Retired     Fish farm manager: RETIRED  Tobacco Use  . Smoking status: Former Smoker    Packs/day: 0.50    Years: 30.00    Pack years: 15.00    Types: Cigarettes    Quit  date: 07/05/1976    Years since quitting: 43.6  . Smokeless tobacco: Never Used  Substance and Sexual Activity  . Alcohol use: No    Alcohol/week: 3.0 standard drinks    Types: 3 Glasses of wine per week    Comment: "not anymore, I haven't in I don't know years"  . Drug use: No  . Sexual activity: Never  Other Topics Concern  . Not on file  Social History Narrative   Patient is married [name]) and lives at home with her husband.   Patient has three adult children.   Patient is retired.   Patient has a college education.   Patient is right-handed.   Patient does not drink any caffeine.   Social Determinants of Health   Financial Resource Strain:   . Difficulty of Paying Living Expenses:   Food Insecurity:   . Worried About Charity fundraiser in the Last Year:   . Arboriculturist in the Last Year:   Transportation Needs:   . Film/video editor (Medical):   Marland Kitchen Lack of Transportation (Non-Medical):   Physical Activity:   . Days of Exercise per Week:   . Minutes of Exercise per Session:   Stress:   . Feeling of Stress :   Social Connections:   .  Frequency of Communication with Friends and Family:   . Frequency of Social Gatherings with Friends and Family:   . Attends Religious Services:   . Active Member of Clubs or Organizations:   . Attends Archivist Meetings:   Marland Kitchen Marital Status:     Family History  Problem Relation Age of Onset  . Heart disease Sister   . Heart attack Sister   . Colon cancer Neg Hx   . Breast cancer Neg Hx   . Celiac disease Neg Hx   . Cirrhosis Neg Hx   . Clotting disorder Neg Hx   . Colitis Neg Hx   . Colon polyps Neg Hx   . Crohn's disease Neg Hx   . Cystic fibrosis Neg Hx   . Diabetes Neg Hx   . Esophageal cancer Neg Hx   . Hemochromatosis Neg Hx   . Inflammatory bowel disease Neg Hx   . Irritable bowel syndrome Neg Hx   . Kidney disease Neg Hx   . Liver cancer Neg Hx   . Liver disease Neg Hx   . Ovarian cancer Neg Hx   . Pancreatic cancer Neg Hx   . Prostate cancer Neg Hx   . Rectal cancer Neg Hx   . Stomach cancer Neg Hx   . Ulcerative colitis Neg Hx   . Uterine cancer Neg Hx   . Wilson's disease Neg Hx     Review of Systems  Constitutional: Negative for appetite change and fever.  Respiratory: Positive for cough (dry). Negative for shortness of breath and wheezing.   Cardiovascular: Negative for chest pain, palpitations and leg swelling.  Gastrointestinal: Negative for abdominal pain.  Neurological: Negative for dizziness (with head movements), light-headedness and headaches (head pain where she fell).  Psychiatric/Behavioral: Negative for dysphoric mood and sleep disturbance. The patient is not nervous/anxious.        Objective:   Vitals:   02/19/20 1314  BP: 126/72  Resp: 16  Temp: (!) 97.5 F (36.4 C)   BP Readings from Last 3 Encounters:  02/19/20 126/72  01/24/20 138/60  01/18/20 115/63   Wt Readings from Last 3 Encounters:  02/19/20 116 lb (  52.6 kg)  01/24/20 112 lb 10.5 oz (51.1 kg)  01/19/20 112 lb 10.5 oz (51.1 kg)   Body mass index is 22.65  kg/m.   Physical Exam    Constitutional: Appears well-developed and well-nourished. No distress.  HENT:  Head: Normocephalic and atraumatic.  Neck: Neck supple. No tracheal deviation present. No thyromegaly present.  No cervical lymphadenopathy Cardiovascular: Normal rate, regular rhythm and normal heart sounds.   No murmur heard. No carotid bruit .  No edema.  Decreased pulses right radial, bilateral DP pulses.  Bilateral distal feet cool to touch and bluish in color-nontender.  Certain distal fingers have a bluish discoloration-nontender Pulmonary/Chest: Effort normal and breath sounds normal. No respiratory distress. No has no wheezes. No rales.  Skin: Skin is warm and dry. Not diaphoretic.  Psychiatric: Normal mood and affect. Behavior is normal.      Assessment & Plan:    See Problem List for Assessment and Plan of chronic medical problems.    This visit occurred during the SARS-CoV-2 public health emergency.  Safety protocols were in place, including screening questions prior to the visit, additional usage of staff PPE, and extensive cleaning of exam room while observing appropriate contact time as indicated for disinfecting solutions.

## 2020-02-18 NOTE — Patient Instructions (Addendum)
  Blood work was ordered.     Medications reviewed and updated.  Changes include :   none   A referral was ordered for vascular surgery   Please followup in 6 months

## 2020-02-19 ENCOUNTER — Other Ambulatory Visit: Payer: Self-pay

## 2020-02-19 ENCOUNTER — Ambulatory Visit (INDEPENDENT_AMBULATORY_CARE_PROVIDER_SITE_OTHER): Payer: Medicare Other | Admitting: Internal Medicine

## 2020-02-19 ENCOUNTER — Encounter: Payer: Self-pay | Admitting: Internal Medicine

## 2020-02-19 VITALS — BP 126/72 | Temp 97.5°F | Resp 16 | Ht 60.0 in | Wt 116.0 lb

## 2020-02-19 DIAGNOSIS — E039 Hypothyroidism, unspecified: Secondary | ICD-10-CM | POA: Diagnosis not present

## 2020-02-19 DIAGNOSIS — K219 Gastro-esophageal reflux disease without esophagitis: Secondary | ICD-10-CM

## 2020-02-19 DIAGNOSIS — F3289 Other specified depressive episodes: Secondary | ICD-10-CM | POA: Diagnosis not present

## 2020-02-19 DIAGNOSIS — E782 Mixed hyperlipidemia: Secondary | ICD-10-CM | POA: Diagnosis not present

## 2020-02-19 DIAGNOSIS — E1165 Type 2 diabetes mellitus with hyperglycemia: Secondary | ICD-10-CM | POA: Diagnosis not present

## 2020-02-19 DIAGNOSIS — I6930 Unspecified sequelae of cerebral infarction: Secondary | ICD-10-CM

## 2020-02-19 DIAGNOSIS — I10 Essential (primary) hypertension: Secondary | ICD-10-CM | POA: Diagnosis not present

## 2020-02-19 DIAGNOSIS — G479 Sleep disorder, unspecified: Secondary | ICD-10-CM | POA: Diagnosis not present

## 2020-02-19 DIAGNOSIS — G3184 Mild cognitive impairment, so stated: Secondary | ICD-10-CM

## 2020-02-19 DIAGNOSIS — R209 Unspecified disturbances of skin sensation: Secondary | ICD-10-CM | POA: Insufficient documentation

## 2020-02-19 DIAGNOSIS — F419 Anxiety disorder, unspecified: Secondary | ICD-10-CM | POA: Diagnosis not present

## 2020-02-19 DIAGNOSIS — R6889 Other general symptoms and signs: Secondary | ICD-10-CM | POA: Insufficient documentation

## 2020-02-19 LAB — TSH: TSH: 21.69 u[IU]/mL — ABNORMAL HIGH (ref 0.35–4.50)

## 2020-02-19 MED ORDER — PANTOPRAZOLE SODIUM 40 MG PO TBEC: 40.0000 mg | DELAYED_RELEASE_TABLET | Freq: Every day | ORAL | 1 refills | Status: DC

## 2020-02-19 NOTE — Assessment & Plan Note (Signed)
Chronic Controlled, stable Continue current dose of medication Cymbalta 60 mg daily 

## 2020-02-19 NOTE — Assessment & Plan Note (Signed)
Chronic  Clinically euthyroid Check tsh  Titrate med dose if needed  

## 2020-02-19 NOTE — Assessment & Plan Note (Signed)
Chronic BP well controlled Current regimen effective and well tolerated Continue current medications at current doses cmp  

## 2020-02-19 NOTE — Assessment & Plan Note (Signed)
Chronic Sleep currently controlled with melatonin 5 mg nightly Continue

## 2020-02-19 NOTE — Assessment & Plan Note (Signed)
Chronic Controlled, stable Continue current dose of medication-60 mg daily of Cymbalta

## 2020-02-19 NOTE — Assessment & Plan Note (Signed)
Chronic She has poor circulation likely related to PAD She is taking Plavix daily She is not experiencing any pain Discussed with her family that there may not be anything that can be done, but will refer to vascular for their input

## 2020-02-19 NOTE — Assessment & Plan Note (Signed)
Chronic Following with neurology Slow progression of memory Has supportive family Not currently taking any medications for her memory

## 2020-02-19 NOTE — Assessment & Plan Note (Signed)
Lab Results  Component Value Date   HGBA1C 6.7 (H) 01/16/2020   Last A1c well controlled Currently taking Januvia 50 mg daily-we will continue

## 2020-02-19 NOTE — Assessment & Plan Note (Signed)
Chronic Continue atorvastatin 80 mg daily 

## 2020-02-19 NOTE — Assessment & Plan Note (Signed)
Chronic GERD controlled Continue daily medication - protonix daily

## 2020-02-19 NOTE — Assessment & Plan Note (Signed)
Chronic Residual right-sided weakness, mild dysphagia and expressive aphasia Taking Plavix, statin Blood pressure well controlled, sugars well controlled

## 2020-02-20 ENCOUNTER — Ambulatory Visit: Payer: Medicare Other | Attending: Internal Medicine

## 2020-02-20 DIAGNOSIS — Z23 Encounter for immunization: Secondary | ICD-10-CM

## 2020-02-20 NOTE — Progress Notes (Signed)
   Covid-19 Vaccination Clinic  Name:  Janet Steele    MRN: [id number] DOB: [birth year]  02/20/2020  [name] was observed post Covid-19 immunization for 15 minutes without incident. She was provided with Vaccine Information Sheet and instruction to access the V-Safe system.   [name] was instructed to call 911 with any severe reactions post vaccine: Marland Kitchen Difficulty breathing  . Swelling of face and throat  . A fast heartbeat  . A bad rash all over body  . Dizziness and weakness   Immunizations Administered    Name Date Dose VIS Date Route   Pfizer COVID-19 Vaccine 02/20/2020 10:17 AM 0.3 mL 11/16/2019 Intramuscular   Manufacturer: [place]   Lot: [id number]   [place]: [id number]

## 2020-02-21 ENCOUNTER — Other Ambulatory Visit: Payer: Self-pay | Admitting: *Deleted

## 2020-02-21 NOTE — Patient Outreach (Addendum)
[facility]) Care Management  02/21/2020  OLEDA BORSKI [birth year] [id number]   Outreach attempt #3, unsuccessful.    Referral received from post acute care coordinator as member was discharged from SNF on 3/5. She was admitted to hospital 2/9-2/13 for colitis and went to SNF for rehab. Noted per chart that she also has history of HTN, CVA, CAD, PAD, GERD, Hypothyroidism, Diabetes, HLD, and degenerative joint disease.  Call was placed to member's son as this care manager was notified that he would be best contact person, no answer.  HIPAA compliant voice message left.  If no call back by 3/23 will close case due to inability to establish contact.    Update @1315 :  Voice message received back from son Coralyn Mark, call placed back to him but call was unsuccessful.  HIPAA compliant voice message left.    Valente David, [place], MSN [place] [phone]

## 2020-02-25 ENCOUNTER — Other Ambulatory Visit: Payer: Self-pay | Admitting: *Deleted

## 2020-02-25 ENCOUNTER — Encounter: Payer: Self-pay | Admitting: *Deleted

## 2020-02-25 DIAGNOSIS — Z85828 Personal history of other malignant neoplasm of skin: Secondary | ICD-10-CM | POA: Diagnosis not present

## 2020-02-25 DIAGNOSIS — D485 Neoplasm of uncertain behavior of skin: Secondary | ICD-10-CM | POA: Diagnosis not present

## 2020-02-25 DIAGNOSIS — C44622 Squamous cell carcinoma of skin of right upper limb, including shoulder: Secondary | ICD-10-CM | POA: Diagnosis not present

## 2020-02-25 NOTE — Patient Outreach (Signed)
[facility]) Care Management  02/25/2020  Janet Steele 07-06-1929 [id number]   Outreach attempt #4, successful.  Referral received from post acute care coordinator as member was discharged from SNF on 3/5. She was admitted to hospital 2/9-2/13 for colitis and went to SNF for rehab. Noted per chart that she also has history of HTN, CVA, CAD, PAD, GERD, Hypothyroidism, Diabetes, HLD, and degenerative joint disease.  Voice message received back from son Coralyn Mark after multiple missed calls.  Member's identity verified.  This care manager introduced self and stated purpose of call, [place] care management services explained.    He report member lives with his dad, her husband and he provides most of the care for her.  Coralyn Mark report he and his brother also help with providing support and are the [employer].  He state that member has improved since being home but feel she would be better if she had home health involvement.  She did have order for services post discharge from SNF but his dad declined services.  Unsure if he misunderstood what the services were for, but they are now requesting to start PT/OT.  Call was placed to PCP office to request new order for home health.    Member uses walker and is requiring minimal assistance with ADLs.  Son state they are having someone to come in the home to help with this.  The family and his dad are managing member's medication and meals without any issues.  He feel the major concern is to have PT/OT started so member can continue to gain strength.  Denies any urgent concerns, will follow up within the next week.  Fall Risk  02/25/2020 02/19/2020 01/02/2019 07/24/2018 11/01/2017  Falls in the past year? 1 1 0 Yes Yes  Number falls in past yr: 1 1 - 2 or more 2 or more  Injury with Fall? 1 1 - - No  Risk Factor Category  - - - - High Fall Risk  Risk for fall due to : History of fall(s) - - Impaired mobility History of fall(s);Impaired balance/gait   Follow up Falls prevention discussed - - - -   Depression screen [facility] 2/9 01/02/2019 01/17/2017 12/17/2016 11/18/2015  Decreased Interest 0 0 0 1  Down, Depressed, Hopeless 1 0 0 0  PHQ - 2 Score 1 0 0 1  Altered sleeping 3 - - -  Tired, decreased energy 3 - - -  Change in appetite 1 - - -  Feeling bad or failure about yourself  0 - - -  Trouble concentrating 1 - - -  Moving slowly or fidgety/restless 0 - - -  Suicidal thoughts 0 - - -  PHQ-9 Score 9 - - -  Difficult doing work/chores Not difficult at all - - -  Some recent data might be hidden   [facility] CM Care Plan Problem One     Most Recent Value  Care Plan Problem One  Risk for readmission as evidenced by recent hospitalization requiring SNF stay  Role Documenting the Problem One  Care Management Coordinator  Care Plan for Problem One  Active  [facility] Long Term Goal   Member will not be readmitted to acute care facility within 31 days of discharge  [facility] Long Term Goal Start Date  02/25/20  Interventions for Problem One Long Term Goal  Reviewed discharge instructions and PCP AVS with son.  Educated on importance of following plan of care in effort to decrease risk of readmission  [facility] CM Short Term Goal #1   Member will have home health start within the next week  [facility] CM Short Term Goal #1 Start Date  02/25/20  Interventions for Short Term Goal #1  Call placed to PCP office to have new order placed for home health  [facility] CM Short Term Goal #2   Family will report compliance with medications over the next 2 weeks  [facility] CM Short Term Goal #2 Start Date  02/25/20  Interventions for Short Term Goal #2  Medications reviewed with son, educated on importance of taking as instructed.     Valente David, [place], MSN [place] [phone]

## 2020-02-26 ENCOUNTER — Ambulatory Visit: Payer: Self-pay | Admitting: *Deleted

## 2020-02-26 ENCOUNTER — Telehealth: Payer: Self-pay

## 2020-02-26 DIAGNOSIS — I6930 Unspecified sequelae of cerebral infarction: Secondary | ICD-10-CM

## 2020-02-26 DIAGNOSIS — R531 Weakness: Secondary | ICD-10-CM

## 2020-02-26 NOTE — Telephone Encounter (Signed)
ok 

## 2020-02-26 NOTE — Telephone Encounter (Signed)
Ok with you?

## 2020-02-26 NOTE — Telephone Encounter (Signed)
Needs new orders put in

## 2020-02-26 NOTE — Telephone Encounter (Signed)
New message    [facility] is asking for a new order for [facility].

## 2020-02-27 NOTE — Telephone Encounter (Signed)
Ordered

## 2020-02-27 NOTE — Progress Notes (Signed)
[facility]    Provider:  Dr Jaynee Eagles Requesting Provider: Larey Seat MD Primary Care Provider:  Binnie Rail, MD  CC:  headache  HPI:  Janet Steele is a 86 y.o. female here as requested by Binnie Rail, MD for headache. She was seen by my colleague Dr. Brett Fairy who asked me to evaluate her headaches. Her headaches are in the temples and top of the left eye. She has had these headaches off and on for years. They feel like stinging, they last 10-20 minutes, She is a poor historian. Sometimes 2x a week or maybe 3x a week. No autonomic symptoms. Laying down helps. No light or sound sensitivity, not sinus related, never wakes her up from sleep. The forehead hurts. No known triggers. She keeps saying it "just hurts". It has been bad a few times, she does not want medications for it, she presses very hard on the forehead and that helps. She lives with her dog, she has a husband and she has 3 children that visit her, one is in [place], the 2 others live close by. No other focal neurologic deficits, associated symptoms, inciting events or modifiable factors.  Reviewed notes, labs and imaging from outside physicians, which showed:  CT head 01/24/2020: personally reviewed imaging and agree with the following:: 1. Mild left posterior scalp hematoma without skull fracture or intracranial hemorrhage. 2. Stable moderate diffuse cerebral and cerebellar atrophy and mild to moderate chronic small vessel white matter ischemic changes in both cerebral hemispheres.  Review of Systems: Patient complains of symptoms per HPI as well as the following symptoms:headaches. Pertinent negatives and positives per HPI. All others negative.   Social History   Socioeconomic History  . Marital status: Married    Spouse name: Janet Steele  . Number of children: 3  . Years of education: 80  . Highest education level: Not on file  Occupational History  . Occupation: Retired     Fish farm manager:  RETIRED  Tobacco Use  . Smoking status: Former Smoker    Packs/day: 0.50    Years: 30.00    Pack years: 15.00    Types: Cigarettes    Quit date: 07/05/1976    Years since quitting: 43.6  . Smokeless tobacco: Never Used  Substance and Sexual Activity  . Alcohol use: No    Alcohol/week: 3.0 standard drinks    Types: 3 Glasses of wine per week    Comment: "not anymore, I haven't in I don't know years"  . Drug use: No  . Sexual activity: Never  Other Topics Concern  . Not on file  Social History Narrative   Patient is married Janet Steele) and lives at home with her husband.   Patient has three adult children.   Patient is retired.   Patient has a college education.   Patient is right-handed.   Patient does not drink any caffeine.   Social Determinants of Health   Financial Resource Strain:   . Difficulty of Paying Living Expenses:   Food Insecurity: No Food Insecurity  . Worried About Charity fundraiser in the Last Year: Never true  . Ran Out of Food in the Last Year: Never true  Transportation Needs: No Transportation Needs  . Lack of Transportation (Medical): No  . Lack of Transportation (Non-Medical): No  Physical Activity:   . Days of Exercise per Week:   . Minutes of Exercise per Session:   Stress:   . Feeling of Stress :   Social Connections:   .  Frequency of Communication with Friends and Family:   . Frequency of Social Gatherings with Friends and Family:   . Attends Religious Services:   . Active Member of Clubs or Organizations:   . Attends Archivist Meetings:   Marland Kitchen Marital Status:   Intimate Partner Violence:   . Fear of Current or Ex-Partner:   . Emotionally Abused:   Marland Kitchen Physically Abused:   . Sexually Abused:     Family History  Problem Relation Age of Onset  . Heart disease Sister   . Heart attack Sister   . Colon cancer Neg Hx   . Breast cancer Neg Hx   . Celiac disease Neg Hx   . Cirrhosis Neg Hx   . Clotting disorder Neg Hx   . Colitis  Neg Hx   . Colon polyps Neg Hx   . Crohn's disease Neg Hx   . Cystic fibrosis Neg Hx   . Diabetes Neg Hx   . Esophageal cancer Neg Hx   . Hemochromatosis Neg Hx   . Inflammatory bowel disease Neg Hx   . Irritable bowel syndrome Neg Hx   . Kidney disease Neg Hx   . Liver cancer Neg Hx   . Liver disease Neg Hx   . Ovarian cancer Neg Hx   . Pancreatic cancer Neg Hx   . Prostate cancer Neg Hx   . Rectal cancer Neg Hx   . Stomach cancer Neg Hx   . Ulcerative colitis Neg Hx   . Uterine cancer Neg Hx   . Wilson's disease Neg Hx     Past Medical History:  Diagnosis Date  . Anemia   . Anxiety   . Arthritis    "fingers" (05/19/2016  . CAD (coronary artery disease)    a. s/p CABG 1982 b. redo CABG 2003. c. [place] despite med rx 05/2016: successful but complicated PCI of PDA beyond graft site, c/b localized dissection requiring overlapping stent.  . Carotid bruit    a. Duplex 01/2015: patent vessels, 1-39% BICA, f/u PRN recommened.  . Colon polyp    adenomatous  . Colon, diverticulosis   . Degenerative joint disease   . Diastolic dysfunction   . Esophageal stricture   . GERD (gastroesophageal reflux disease)   . Hiatal hernia   . History of blood transfusion    "related to my bypass"  . Hyperlipidemia   . Hypertension   . Hypothyroidism   . Ischemic cardiomyopathy    a. Cath 05/2016: EF 40% with severe inferior wall HK, suspect hibernating myocardium.  . Myocardial infarction ([place]) 1977; 1982  . Osteoarthrosis, unspecified whether generalized or localized, unspecified site   . Presence of permanent cardiac pacemaker   . PVD (peripheral vascular disease) ([place])   . Type II diabetes mellitus ([place])   . Unspecified hearing loss     Patient Active Problem List   Diagnosis Date Noted  . Cold extremities 02/19/2020  . AKI (acute kidney injury) ([place]) 01/15/2020  . Colitis 01/15/2020  . Headache 07/21/2019  . Lower abdominal pain 02/05/2019  . Other headache syndrome 08/14/2018  .  Depression 08/14/2018  . Weight loss 06/05/2018  . Stroke ([place]) 11/04/2017  . Weakness 11/03/2017  . History of cerebrovascular accident (CVA) with residual deficit 11/03/2017  . Chronic back pain 07/04/2017  . PAD (peripheral artery disease) ([place]) 01/17/2017  . Overactive bladder 12/17/2016  . Anemia 05/20/2016  . Coronary artery disease involving coronary bypass graft of native heart without angina pectoris 05/19/2016  .  Numbness and tingling of foot 12/31/2015  . MCI (mild cognitive impairment) with memory loss 11/18/2015  . Facial tic 05/07/2015  . Sick sinus syndrome ([place]) 02/26/2015  . Cervical dystonia 12/04/2014  . Chronic motor tic 09/12/2014  . Pacemaker-Medtronic 07/07/2012  . GERD (gastroesophageal reflux disease) 07/05/2012  . Cough 08/04/2010  . Abnormal involuntary movement 12/17/2009  . Disturbance in sleep behavior 12/11/2009  . CAD, ARTERY BYPASS GRAFT 09/24/2009  . CAROTID BRUIT 09/24/2009  . Diabetes ([place]) 01/30/2009  . Anxiety 02/14/2008  . EROSIVE ESOPHAGITIS 02/14/2008  . CONSTIPATION, CHRONIC 02/14/2008  . DEGENERATIVE JOINT DISEASE 02/14/2008  . HEARING LOSS 01/04/2008  . PVD 01/04/2008  . Hypothyroidism 09/25/2007  . HYPERLIPIDEMIA 09/25/2007  . Essential hypertension 09/07/2007  . Diabetic polyneuropathy ([place]) 03/16/2007  . Stricture and stenosis of esophagus 03/16/2007  . HIATAL HERNIA 02/16/2007  . HEMORRHOIDS 12/31/1997  . DIVERTICULOSIS, COLON 12/31/1997    Past Surgical History:  Procedure Laterality Date  . ABDOMINAL HYSTERECTOMY    . APPENDECTOMY    . BACK SURGERY    . CARDIAC CATHETERIZATION N/A 05/19/2016   Procedure: Left Heart Cath and Cors/Grafts Angiography;  Surgeon: Belva Crome, MD;  Location: [facility];  Service: Cardiovascular;  Laterality: N/A;  . CARDIAC CATHETERIZATION N/A 05/19/2016   Procedure: Coronary Stent Intervention;  Surgeon: Belva Crome, MD;  Location: [facility];  Service: Cardiovascular;   Laterality: N/A;  . CATARACT EXTRACTION W/ INTRAOCULAR LENS  IMPLANT, BILATERAL Bilateral   . CHOLECYSTECTOMY OPEN    . COLONOSCOPY    . CORONARY ANGIOPLASTY WITH STENT PLACEMENT  05/19/2016   "2 stents?"  . CORONARY ARTERY BYPASS GRAFT  1982; 2003   x4(1982),02-2002 CABG X2  . ESOPHAGOGASTRODUODENOSCOPY (EGD) WITH ESOPHAGEAL DILATION  "2-3 times"  . INSERT / REPLACE / REMOVE PACEMAKER    . LEFT HEART CATHETERIZATION WITH CORONARY/GRAFT ANGIOGRAM N/A 03/05/2014   Procedure: LEFT HEART CATHETERIZATION WITH Beatrix Fetters;  Surgeon: Sinclair Grooms, MD;  [facility];  Service: Cardiovascular;  Laterality: N/A;  . LUMBAR [place] SURGERY  01-2000  . OOPHORECTOMY Bilateral   . PERMANENT PACEMAKER INSERTION N/A 07/06/2012   MDT Adapta L implanted by Dr Rayann Heman for symptomatic bradycardia  . TONSILLECTOMY  1930s    Current Outpatient Medications  Medication Sig Dispense Refill  . atorvastatin (LIPITOR) 80 MG tablet TAKE 1 TABLET BY MOUTH EVERY DAY (Patient taking differently: Take 80 mg by mouth daily. ) 90 tablet 1  . clopidogrel (PLAVIX) 75 MG tablet TAKE 1 TABLET BY MOUTH EVERY DAY 90 tablet 0  . DULoxetine (CYMBALTA) 60 MG capsule TAKE 1 CAPSULE(60 MG) BY MOUTH DAILY (Patient taking differently: Take 60 mg by mouth daily. ) 90 capsule 0  . gabapentin (NEURONTIN) 100 MG capsule Take 2 capsules (200 mg total) by mouth 2 (two) times daily. 120 capsule 6  . JANUVIA 50 MG tablet Take 50 mg by mouth daily.  0  . levothyroxine (SYNTHROID) 88 MCG tablet TAKE 1 TABLET(88 MCG) BY MOUTH DAILY (Patient taking differently: Take 88 mcg by mouth daily. ) 90 tablet 0  . Melatonin 5 MG TABS Take by mouth.    . metoprolol tartrate (LOPRESSOR) 25 MG tablet TAKE 1 TABLET BY MOUTH TWICE DAILY (Patient taking differently: Take 25 mg by mouth 2 (two) times daily. ) 180 tablet 0  . nitroGLYCERIN (NITROSTAT) 0.4 MG SL tablet Place 1 tablet (0.4 mg total) under the tongue every 5 (five) minutes as  needed. For  chest pain 25 tablet 5  . pantoprazole (PROTONIX) 40 MG tablet Take 1 tablet (40 mg total) by mouth daily. 90 tablet 1   No current facility-administered medications for this visit.    Allergies as of 02/28/2020 - Review Complete 02/28/2020  Allergen Reaction Noted  . Morphine Hives, Itching, and Rash 12/03/2009  . Penicillins Itching and Rash 12/10/2010  . Prednisone Other (See Comments) 04/05/2013  . Sulfonamide derivatives Itching and Rash 12/03/2009  . Codeine Rash 12/03/2009  . Hydrocodone-acetaminophen Other (See Comments) 01/04/2008  . Meperidine hcl Itching 12/03/2009  . Metoclopramide hcl Itching and Rash 12/03/2009  . Metoprolol succinate Other (See Comments)   . Moxifloxacin Itching 12/03/2009  . Oxycodone-aspirin Rash 12/03/2009  . Pentazocine lactate Nausea Only 12/03/2009  . Pioglitazone Other (See Comments) 01/04/2008    Vitals: BP 103/63 (BP Location: Right Arm, Patient Position: Sitting)   Pulse 65   Temp (!) 97.3 F (36.3 C) Comment: taken at front  Ht 5' (1.524 m)   Wt 114 lb (51.7 kg)   BMI 22.26 kg/m  Last Weight:  Wt Readings from Last 1 Encounters:  02/28/20 114 lb (51.7 kg)   Last Height:   Ht Readings from Last 1 Encounters:  02/28/20 5' (1.524 m)     Physical exam: Exam: Gen: NAD, conversant           CV: RRR, no MRG. No Carotid Bruits. No peripheral edema, warm, nontender Eyes: Conjunctivae clear without exudates or hemorrhage  Neuro: Detailed Neurologic Exam  Speech:    Speech is normal; fluent and spontaneous with normal comprehension.  Cognition:    The patient is oriented to person, place, and time; Slightly tangential and poor historian. Cranial Nerves:    The pupils are equal, round, and reactive to light.  Visual fields are full to threat. Extraocular movements are intact. Trigeminal sensation is intact and the muscles of mastication are normal. The face is symmetric. The palate elevates in the midline. Hearing  impaired. Voice is normal. Shoulder shrug is normal. The tongue has normal motion without fasciculations.   Coordination:    No dysmetria  Gait:     Using a walker, stable, normal stance and stride  Motor Observation:    No asymmetry, no atrophy, and no involuntary movements noted. Tone:    Normal muscle tone.      Strength:    Strength is symmetrical in the upper and lower limbs.      Sensation: intact to LT     Reflex Exam:  DTR's:    Deep tendon reflexes in the upper and lower extremities are normal bilaterally.   Toes:    The toes are downgoing bilaterally.   Clonus:    Clonus is absent.    Assessment/Plan:  LATISIA HILAIRE is a 86 y.o. female here as requested by Binnie Rail, MD for headache. She was seen by my colleague Dr. Brett Fairy who asked me to evaluate her headaches. Her headaches are in the temples and top of the left eye. She has had these headaches off and on for years. They feel like stinging, they last 10-20 minutes, She is a poor historian. Sometimes 2x a week or maybe 3x a week. No autonomic symptoms.  - She doesn't want any new medications, we can adjust gabapentin to 200mg  twice daily. She takes her own medications so taking it 3x a day may be difficult, will increase it to 200mg  bid and then if she tolerates it can further increase to  300mg  bid or switch to lyrica. She is in agreement with this plan. May try a very small dose of nortriptyline in the future however need to be careful in the elderly with this class of medication.   Meds ordered this encounter  Medications  . gabapentin (NEURONTIN) 100 MG capsule    Sig: Take 2 capsules (200 mg total) by mouth 2 (two) times daily.    Dispense:  120 capsule    Refill:  6    Cc: Burns, Claudina Lick, MD,    Sarina Ill, MD  [facility] [address] [place], [place] [address]  Phone [phone] Fax [phone]  I spent 30 minutes of face-to-face and non-face-to-face  time with patient on the  1. Intractable episodic paroxysmal hemicrania    diagnosis.  This included previsit chart review, lab review, study review, order entry, electronic health record documentation, patient education on the different diagnostic and therapeutic options, counseling and coordination of care, risks and benefits of management, compliance, or risk factor reduction

## 2020-02-28 ENCOUNTER — Ambulatory Visit (INDEPENDENT_AMBULATORY_CARE_PROVIDER_SITE_OTHER): Payer: Medicare Other | Admitting: Neurology

## 2020-02-28 ENCOUNTER — Encounter: Payer: Self-pay | Admitting: Neurology

## 2020-02-28 ENCOUNTER — Other Ambulatory Visit: Payer: Self-pay

## 2020-02-28 VITALS — BP 103/63 | HR 65 | Temp 97.3°F | Ht 60.0 in | Wt 114.0 lb

## 2020-02-28 DIAGNOSIS — G44031 Episodic paroxysmal hemicrania, intractable: Secondary | ICD-10-CM

## 2020-02-28 MED ORDER — GABAPENTIN 100 MG PO CAPS: 200.0000 mg | ORAL_CAPSULE | Freq: Two times a day (BID) | ORAL | 6 refills | Status: DC

## 2020-02-28 NOTE — Patient Instructions (Signed)
Increase Gabapentin to 2 pills(200mg ) twice daily  Gabapentin capsules or tablets What is this medicine? GABAPENTIN (GA ba pen tin) is used to control seizures in certain types of epilepsy. It is also used to treat certain types of nerve pain. This medicine may be used for other purposes; ask your health care provider or pharmacist if you have questions. COMMON BRAND NAME(S): Active-PAC with Gabapentin, Gabarone, Neurontin What should I tell my health care provider before I take this medicine? They need to know if you have any of these conditions:  history of drug abuse or alcohol abuse problem  kidney disease  lung or breathing disease  suicidal thoughts, plans, or attempt; a previous suicide attempt by you or a family member  an unusual or allergic reaction to gabapentin, other medicines, foods, dyes, or preservatives  pregnant or trying to get pregnant  breast-feeding How should I use this medicine? Take this medicine by mouth with a glass of water. Follow the directions on the prescription label. You can take it with or without food. If it upsets your stomach, take it with food. Take your medicine at regular intervals. Do not take it more often than directed. Do not stop taking except on your doctor's advice. If you are directed to break the 600 or 800 mg tablets in half as part of your dose, the extra half tablet should be used for the next dose. If you have not used the extra half tablet within 28 days, it should be thrown away. A special MedGuide will be given to you by the pharmacist with each prescription and refill. Be sure to read this information carefully each time. Talk to your pediatrician regarding the use of this medicine in children. While this drug may be prescribed for children as young as 3 years for selected conditions, precautions do apply. Overdosage: If you think you have taken too much of this medicine contact a poison control center or emergency room at  once. NOTE: This medicine is only for you. Do not share this medicine with others. What if I miss a dose? If you miss a dose, take it as soon as you can. If it is almost time for your next dose, take only that dose. Do not take double or extra doses. What may interact with this medicine? This medicine may interact with the following medications:  alcohol  antihistamines for allergy, cough, and cold  certain medicines for anxiety or sleep  certain medicines for depression like amitriptyline, fluoxetine, sertraline  certain medicines for seizures like phenobarbital, primidone  certain medicines for stomach problems  general anesthetics like halothane, isoflurane, methoxyflurane, propofol  local anesthetics like lidocaine, pramoxine, tetracaine  medicines that relax muscles for surgery  narcotic medicines for pain  phenothiazines like chlorpromazine, mesoridazine, prochlorperazine, thioridazine This list may not describe all possible interactions. Give your health care provider a list of all the medicines, herbs, non-prescription drugs, or dietary supplements you use. Also tell them if you smoke, drink alcohol, or use illegal drugs. Some items may interact with your medicine. What should I watch for while using this medicine? Visit your doctor or health care provider for regular checks on your progress. You may want to keep a record at home of how you feel your condition is responding to treatment. You may want to share this information with your doctor or health care provider at each visit. You should contact your doctor or health care provider if your seizures get worse or if you have any  new types of seizures. Do not stop taking this medicine or any of your seizure medicines unless instructed by your doctor or health care provider. Stopping your medicine suddenly can increase your seizures or their severity. This medicine may cause serious skin reactions. They can happen weeks to  months after starting the medicine. Contact your health care provider right away if you notice fevers or flu-like symptoms with a rash. The rash may be red or purple and then turn into blisters or peeling of the skin. Or, you might notice a red rash with swelling of the face, lips or lymph nodes in your neck or under your arms. Wear a medical identification bracelet or chain if you are taking this medicine for seizures, and carry a card that lists all your medications. You may get drowsy, dizzy, or have blurred vision. Do not drive, use machinery, or do anything that needs mental alertness until you know how this medicine affects you. To reduce dizzy or fainting spells, do not sit or stand up quickly, especially if you are an older patient. Alcohol can increase drowsiness and dizziness. Avoid alcoholic drinks. Your mouth may get dry. Chewing sugarless gum or sucking hard candy, and drinking plenty of water will help. The use of this medicine may increase the chance of suicidal thoughts or actions. Pay special attention to how you are responding while on this medicine. Any worsening of mood, or thoughts of suicide or dying should be reported to your health care provider right away. Women who become pregnant while using this medicine may enroll in the [place] Pregnancy Registry by calling [phone]. This registry collects information about the safety of antiepileptic drug use during pregnancy. What side effects may I notice from receiving this medicine? Side effects that you should report to your doctor or health care professional as soon as possible:  allergic reactions like skin rash, itching or hives, swelling of the face, lips, or tongue  breathing problems  rash, fever, and swollen lymph nodes  redness, blistering, peeling or loosening of the skin, including inside the mouth  suicidal thoughts, mood changes Side effects that usually do not require medical attention  (report to your doctor or health care professional if they continue or are bothersome):  dizziness  drowsiness  headache  nausea, vomiting  swelling of ankles, feet, hands  tiredness This list may not describe all possible side effects. Call your doctor for medical advice about side effects. You may report side effects to FDA at 1-800-FDA-1088. Where should I keep my medicine? Keep out of reach of children. This medicine may cause accidental overdose and death if it taken by other adults, children, or pets. Mix any unused medicine with a substance like cat litter or coffee grounds. Then throw the medicine away in a sealed container like a sealed bag or a coffee can with a lid. Do not use the medicine after the expiration date. Store at room temperature between 15 and 30 degrees C (59 and 86 degrees F). NOTE: This sheet is a summary. It may not cover all possible information. If you have questions about this medicine, talk to your doctor, pharmacist, or health care provider.  2020 Elsevier/Gold Standard (2019-02-23 14:16:43)

## 2020-03-03 ENCOUNTER — Other Ambulatory Visit: Payer: Self-pay | Admitting: *Deleted

## 2020-03-03 NOTE — Patient Outreach (Signed)
Janet Steele) Care Management  03/03/2020  Janet Steele 05/11/1929 [id number]   Weekly transition of care call placed to member's son Coralyn Mark.  He report member is doing better, denies any backwards decline in recovery.  He state they have not been in contact with home health to start services as of yet.  She is still using her walker to mover around the home but he feel her strength would improve if she had therapy.  Denies a loss of appetite, state she does not drink Ensure or other supplement.  Discussed nutritional value of having her consume it.  Call was placed to [facility] to follow up on referral, notified that member's husband was contacted last week but he deferred start of services until this week.  First visit from PT will be tomorrow.  Advised that it may be best to contact son to avoid confusion as member's husband previously declined home health services.  Son updated on plan for home health.  He denies any concerns at this time, will follow up within the next week.  THN CM Care Plan Problem One     Most Recent Value  Care Plan Problem One  Risk for readmission as evidenced by recent hospitalization requiring SNF stay  Role Documenting the Problem One  Care Management [place] for Problem One  Active  [facility]N Long Term Goal   Member will not be readmitted to acute care facility within 31 days of discharge  [facility] Long Term Goal Start Date  02/25/20  Interventions for Problem One Long Term Goal  Neurology AVS reviewed with son.  Plan of care regarding increasing stength and upcoming MD appoitments.  THN CM Short Term Goal #1   Member will have home health start within the next week  [facility]N CM Short Term Goal #1 Start Date  02/25/20  Interventions for Short Term Goal #1  Call placed to [facility] to follow up on referral, plan for visits discussed with son.  Son's contact information provided to [facility]  THN CM Short Term Goal #2   Family will report compliance with medications  over the next 2 weeks  THN CM Short Term Goal #2 Start Date  02/25/20  Interventions for Short Term Goal #2  Discussed with son member's medication adjustments, encouraaged to review with member     Valente David, Therapist, sports, MSN [place] Manager [phone]

## 2020-03-11 ENCOUNTER — Other Ambulatory Visit: Payer: Self-pay | Admitting: *Deleted

## 2020-03-11 NOTE — Patient Outreach (Signed)
[facility] ([facility]) Care Management  03/11/2020  Janet Steele 07/23/1929 [id number]   Weekly transition of care call placed to member's son, Terry.  He report member is doing well.  State home health team had been in contact with them but once visit was scheduled member refused to participate.  He still feel as if she would benefit from the services but he's also trying to abide by her wishes.  He state she is progressing slowly, able to do more for herself.  She has appointment scheduled with dermatologist within the next couple weeks to remove a potential skin cancer.  Otherwise, he denies any concerns.  Agrees to follow up call next week.  [facility] CM Care Plan Problem One     Most Recent Value  Care Plan Problem One  Risk for readmission as evidenced by recent hospitalization requiring SNF stay  Role Documenting the Problem One  Care Management Coordinator  Care Plan for Problem One  Active  [facility] Long Term Goal   Member will not be readmitted to acute care facility within 31 days of discharge  [facility] Long Term Goal Start Date  02/25/20  [facility] CM Short Term Goal #1   Member will have home health start within the next week  [facility] CM Short Term Goal #1 Start Date  02/25/20  [facility] CM Short Term Goal #1 Met Date  -- [Not met]  [facility] CM Short Term Goal #2   Family will report compliance with medications over the next 2 weeks  [facility] CM Short Term Goal #2 Start Date  02/25/20  [facility] CM Short Term Goal #2 Met Date  03/11/20      Lane, RN, MSN [facility] Care Management  Community Care Manager [phone]  

## 2020-03-12 DIAGNOSIS — H43813 Vitreous degeneration, bilateral: Secondary | ICD-10-CM | POA: Diagnosis not present

## 2020-03-12 DIAGNOSIS — H353213 Exudative age-related macular degeneration, right eye, with inactive scar: Secondary | ICD-10-CM | POA: Diagnosis not present

## 2020-03-12 DIAGNOSIS — H353122 Nonexudative age-related macular degeneration, left eye, intermediate dry stage: Secondary | ICD-10-CM | POA: Diagnosis not present

## 2020-03-19 ENCOUNTER — Other Ambulatory Visit: Payer: Self-pay | Admitting: *Deleted

## 2020-03-19 DIAGNOSIS — Z85828 Personal history of other malignant neoplasm of skin: Secondary | ICD-10-CM | POA: Diagnosis not present

## 2020-03-19 DIAGNOSIS — C44622 Squamous cell carcinoma of skin of right upper limb, including shoulder: Secondary | ICD-10-CM | POA: Diagnosis not present

## 2020-03-19 NOTE — Patient Outreach (Signed)
[facility]) Care Management  03/19/2020  ROXANNA MCEVER Aug 27, 1929 [id number]   Weekly transition of care call placed to member's son.  He state this is not a good time talk and will call this care manager back.  Will await call back, if no call back will follow up within the next week.  Valente David, [place], MSN [place] [phone]

## 2020-03-27 ENCOUNTER — Other Ambulatory Visit: Payer: Self-pay | Admitting: *Deleted

## 2020-03-27 NOTE — Patient Outreach (Signed)
Janet Steele) Care Management  03/27/2020  Janet Steele April 02, 1929 [id number]   Weekly transition of care call placed to member's son.  State he does not have much time to talk as he is preparing for a meeting.  He report member is doing "very good."  State she did have her appointment with the dermatologist to have her skin cancer removed, denies any complications.  He continues to provide support to member and husband but state member has progressed well and doing more daily for herself.  Denies any urgent concerns, will follow up within the next month.  If remain stable, will consider transition to health coach versus case closure.  [facility] CM Care Plan Problem One     Most Recent Value  Care Plan Problem One  Risk for readmission as evidenced by recent hospitalization requiring SNF stay  Role Documenting the Problem One  Care Management Janet Steele for Problem One  Active  [facility] Long Term Goal   Member will not be readmitted to acute care facility within 31 days of discharge  Janet Steele Long Term Goal Start Date  02/25/20  Janet Steele Long Term Goal Met Date  03/27/20  Janet Steele CM Short Term Goal #1   Member will have home health start within the next week  [facility] CM Short Term Goal #1 Start Date  02/25/20  [facility] CM Short Term Goal #1 Met Date  -- [Not met]  [facility] CM Short Term Goal #2   Family will report compliance with medications over the next 2 weeks  [facility] CM Short Term Goal #2 Start Date  02/25/20  Janet Steele CM Short Term Goal #2 Met Date  03/11/20     Valente David, RN, MSN Janet Steele [phone]

## 2020-04-01 ENCOUNTER — Encounter (HOSPITAL_COMMUNITY): Payer: Medicare Other

## 2020-04-01 ENCOUNTER — Encounter: Payer: Medicare Other | Admitting: Vascular Surgery

## 2020-04-08 ENCOUNTER — Other Ambulatory Visit: Payer: Self-pay | Admitting: Internal Medicine

## 2020-04-08 DIAGNOSIS — I25700 Atherosclerosis of coronary artery bypass graft(s), unspecified, with unstable angina pectoris: Secondary | ICD-10-CM

## 2020-04-08 DIAGNOSIS — I200 Unstable angina: Secondary | ICD-10-CM

## 2020-04-22 ENCOUNTER — Telehealth: Payer: Self-pay

## 2020-04-22 NOTE — Telephone Encounter (Signed)
Appointment scheduled for 5/25 in regards.

## 2020-04-22 NOTE — Telephone Encounter (Signed)
As long as it is not home PT not visit is needed -- he is ok with taking her to PT right?  If yes, I will order

## 2020-04-22 NOTE — Telephone Encounter (Signed)
New message    Husband calling needs a   Referral: PT / OT   Reason: difficult walking   Facility: open to suggestions

## 2020-04-22 NOTE — Telephone Encounter (Signed)
Does she need a visit?

## 2020-04-28 NOTE — Progress Notes (Signed)
Subjective:    Patient ID: Janet Steele, female    DOB: Nov 27, 1929, 86 y.o.   MRN: [id number]  HPI The patient is here for an acute visit for PT/OT evaluation.    She is here with her husband.      Physical deconditioning, difficulty walking, poor balance, h/o cva with right sided weakness - she would like to do some PT to improve her walking.    She has difficulty walking.  She uses a walker.  She has difficulty getting up from a chair sometimes.    Hypothyroidism:  She is taking her medication daily.  She denies any recent changes in energy or weight that are unexplained. Her medication was adjusted two months ago.    Hypertension: She is taking her medication daily. She is compliant with a low sodium diet.     Medications and allergies reviewed with patient and updated if appropriate.  Patient Active Problem List   Diagnosis Date Noted  . Cold extremities 02/19/2020  . AKI (acute kidney injury) ([place]) 01/15/2020  . Colitis 01/15/2020  . Headache 07/21/2019  . Lower abdominal pain 02/05/2019  . Other headache syndrome 08/14/2018  . Depression 08/14/2018  . Weight loss 06/05/2018  . Stroke ([place]) 11/04/2017  . Weakness 11/03/2017  . History of cerebrovascular accident (CVA) with residual deficit 11/03/2017  . Chronic back pain 07/04/2017  . PAD (peripheral artery disease) ([place]) 01/17/2017  . Overactive bladder 12/17/2016  . Anemia 05/20/2016  . Coronary artery disease involving coronary bypass graft of native heart without angina pectoris 05/19/2016  . Numbness and tingling of foot 12/31/2015  . MCI (mild cognitive impairment) with memory loss 11/18/2015  . Facial tic 05/07/2015  . Sick sinus syndrome ([place]) 02/26/2015  . Cervical dystonia 12/04/2014  . Chronic motor tic 09/12/2014  . Pacemaker-Medtronic 07/07/2012  . GERD (gastroesophageal reflux disease) 07/05/2012  . Cough 08/04/2010  . Abnormal involuntary movement 12/17/2009  . Disturbance in sleep behavior  12/11/2009  . CAD, ARTERY BYPASS GRAFT 09/24/2009  . CAROTID BRUIT 09/24/2009  . Diabetes ([place]) 01/30/2009  . Anxiety 02/14/2008  . EROSIVE ESOPHAGITIS 02/14/2008  . CONSTIPATION, CHRONIC 02/14/2008  . DEGENERATIVE JOINT DISEASE 02/14/2008  . HEARING LOSS 01/04/2008  . PVD 01/04/2008  . Hypothyroidism 09/25/2007  . HYPERLIPIDEMIA 09/25/2007  . Essential hypertension 09/07/2007  . Diabetic polyneuropathy ([place]) 03/16/2007  . Stricture and stenosis of esophagus 03/16/2007  . HIATAL HERNIA 02/16/2007  . HEMORRHOIDS 12/31/1997  . DIVERTICULOSIS, COLON 12/31/1997    Current Outpatient Medications on File Prior to Visit  Medication Sig Dispense Refill  . atorvastatin (LIPITOR) 80 MG tablet TAKE 1 TABLET BY MOUTH EVERY DAY (Patient taking differently: Take 80 mg by mouth daily. ) 90 tablet 1  . clopidogrel (PLAVIX) 75 MG tablet TAKE 1 TABLET BY MOUTH EVERY DAY 90 tablet 0  . DULoxetine (CYMBALTA) 60 MG capsule TAKE 1 CAPSULE(60 MG) BY MOUTH DAILY (Patient taking differently: Take 60 mg by mouth daily. ) 90 capsule 0  . gabapentin (NEURONTIN) 100 MG capsule Take 2 capsules (200 mg total) by mouth 2 (two) times daily. 120 capsule 6  . JANUVIA 50 MG tablet Take 50 mg by mouth daily.  0  . levothyroxine (SYNTHROID) 88 MCG tablet TAKE 1 TABLET(88 MCG) BY MOUTH DAILY (Patient taking differently: Take 88 mcg by mouth daily. ) 90 tablet 0  . Melatonin 5 MG TABS Take by mouth.    . metoprolol tartrate (LOPRESSOR) 25 MG tablet TAKE 1 TABLET BY  MOUTH TWICE DAILY 180 tablet 0  . nitroGLYCERIN (NITROSTAT) 0.4 MG SL tablet Place 1 tablet (0.4 mg total) under the tongue every 5 (five) minutes as needed. For chest pain 25 tablet 5  . pantoprazole (PROTONIX) 40 MG tablet Take 1 tablet (40 mg total) by mouth daily. 90 tablet 1   No current facility-administered medications on file prior to visit.    Past Medical History:  Diagnosis Date  . Anemia   . Anxiety   . Arthritis    "fingers" (05/19/2016  .  CAD (coronary artery disease)    a. s/p CABG 1982 b. redo CABG 2003. c. [place] despite med rx 05/2016: successful but complicated PCI of PDA beyond graft site, c/b localized dissection requiring overlapping stent.  . Carotid bruit    a. Duplex 01/2015: patent vessels, 1-39% BICA, f/u PRN recommened.  . Colon polyp    adenomatous  . Colon, diverticulosis   . Degenerative joint disease   . Diastolic dysfunction   . Esophageal stricture   . GERD (gastroesophageal reflux disease)   . Hiatal hernia   . History of blood transfusion    "related to my bypass"  . Hyperlipidemia   . Hypertension   . Hypothyroidism   . Ischemic cardiomyopathy    a. Cath 05/2016: EF 40% with severe inferior wall HK, suspect hibernating myocardium.  . Myocardial infarction ([place]) 1977; 1982  . Osteoarthrosis, unspecified whether generalized or localized, unspecified site   . Presence of permanent cardiac pacemaker   . PVD (peripheral vascular disease) ([place])   . Type II diabetes mellitus ([place])   . Unspecified hearing loss     Past Surgical History:  Procedure Laterality Date  . ABDOMINAL HYSTERECTOMY    . APPENDECTOMY    . BACK SURGERY    . CARDIAC CATHETERIZATION N/A 05/19/2016   Procedure: Left Heart Cath and Cors/Grafts Angiography;  Surgeon: Belva Crome, MD;  Location: [place] [facility];  Service: Cardiovascular;  Laterality: N/A;  . CARDIAC CATHETERIZATION N/A 05/19/2016   Procedure: Coronary Stent Intervention;  Surgeon: Belva Crome, MD;  Location: [place] [facility];  Service: Cardiovascular;  Laterality: N/A;  . CATARACT EXTRACTION W/ INTRAOCULAR LENS  IMPLANT, BILATERAL Bilateral   . CHOLECYSTECTOMY OPEN    . COLONOSCOPY    . CORONARY ANGIOPLASTY WITH STENT PLACEMENT  05/19/2016   "2 stents?"  . CORONARY ARTERY BYPASS GRAFT  1982; 2003   x4(1982),02-2002 CABG X2  . ESOPHAGOGASTRODUODENOSCOPY (EGD) WITH ESOPHAGEAL DILATION  "2-3 times"  . INSERT / REPLACE / REMOVE PACEMAKER    . LEFT HEART  CATHETERIZATION WITH CORONARY/GRAFT ANGIOGRAM N/A 03/05/2014   Procedure: LEFT HEART CATHETERIZATION WITH Beatrix Fetters;  Surgeon: Sinclair Grooms, MD;  Location: [facility] CATH [facility];  Service: Cardiovascular;  Laterality: N/A;  . LUMBAR [place] SURGERY  01-2000  . OOPHORECTOMY Bilateral   . PERMANENT PACEMAKER INSERTION N/A 07/06/2012   MDT Adapta L implanted by Dr Rayann Heman for symptomatic bradycardia  . TONSILLECTOMY  1930s    Social History   Socioeconomic History  . Marital status: Married    Spouse name: [name]  . Number of children: 3  . Years of education: 39  . Highest education level: Not on file  Occupational History  . Occupation: Retired     Fish farm manager: RETIRED  Tobacco Use  . Smoking status: Former Smoker    Packs/day: 0.50    Years: 30.00    Pack years: 15.00    Types: Cigarettes  Quit date: 07/05/1976    Years since quitting: 43.8  . Smokeless tobacco: Never Used  Substance and Sexual Activity  . Alcohol use: No    Alcohol/week: 3.0 standard drinks    Types: 3 Glasses of wine per week    Comment: "not anymore, I haven't in I don't know years"  . Drug use: No  . Sexual activity: Never  Other Topics Concern  . Not on file  Social History Narrative   Patient is married [name]) and lives at home with her husband.   Patient has three adult children.   Patient is retired.   Patient has a college education.   Patient is right-handed.   Patient does not drink any caffeine.   Social Determinants of Health   Financial Resource Strain:   . Difficulty of Paying Living Expenses:   Food Insecurity: No Food Insecurity  . Worried About Charity fundraiser in the Last Year: Never true  . Ran Out of Food in the Last Year: Never true  Transportation Needs: No Transportation Needs  . Lack of Transportation (Medical): No  . Lack of Transportation (Non-Medical): No  Physical Activity:   . Days of Exercise per Week:   . Minutes of Exercise per Session:   Stress:   .  Feeling of Stress :   Social Connections:   . Frequency of Communication with Friends and Family:   . Frequency of Social Gatherings with Friends and Family:   . Attends Religious Services:   . Active Member of Clubs or Organizations:   . Attends Archivist Meetings:   Marland Kitchen Marital Status:     Family History  Problem Relation Age of Onset  . Heart disease Sister   . Heart attack Sister   . Colon cancer Neg Hx   . Breast cancer Neg Hx   . Celiac disease Neg Hx   . Cirrhosis Neg Hx   . Clotting disorder Neg Hx   . Colitis Neg Hx   . Colon polyps Neg Hx   . Crohn's disease Neg Hx   . Cystic fibrosis Neg Hx   . Diabetes Neg Hx   . Esophageal cancer Neg Hx   . Hemochromatosis Neg Hx   . Inflammatory bowel disease Neg Hx   . Irritable bowel syndrome Neg Hx   . Kidney disease Neg Hx   . Liver cancer Neg Hx   . Liver disease Neg Hx   . Ovarian cancer Neg Hx   . Pancreatic cancer Neg Hx   . Prostate cancer Neg Hx   . Rectal cancer Neg Hx   . Stomach cancer Neg Hx   . Ulcerative colitis Neg Hx   . Uterine cancer Neg Hx   . Wilson's disease Neg Hx     Review of Systems  Constitutional: Negative for fever.  Respiratory: Negative for shortness of breath.   Cardiovascular: Negative for chest pain, palpitations and leg swelling.  Neurological: Positive for headaches (chronic).       Objective:   Vitals:   04/29/20 1426  BP: 130/60  Pulse: 62  Resp: 16  Temp: 98.8 F (37.1 C)  SpO2: 98%   BP Readings from Last 3 Encounters:  04/29/20 130/60  02/28/20 103/63  02/19/20 126/72   Wt Readings from Last 3 Encounters:  04/29/20 115 lb (52.2 kg)  02/28/20 114 lb (51.7 kg)  02/19/20 116 lb (52.6 kg)   Body mass index is 22.46 kg/m.   Physical Exam Constitutional:  General: She is not in acute distress.    Appearance: Normal appearance. She is not ill-appearing.  HENT:     Head: Normocephalic and atraumatic.  Cardiovascular:     Rate and Rhythm: Normal  rate and regular rhythm.  Pulmonary:     Effort: Pulmonary effort is normal.     Breath sounds: Normal breath sounds.  Musculoskeletal:     Right lower leg: No edema.     Left lower leg: No edema.  Skin:    General: Skin is warm and dry.  Neurological:     Mental Status: She is alert and oriented to person, place, and time.     Sensory: No sensory deficit.     Motor: Weakness (mild b/l LE weakness R> L) present.     Gait: Gait abnormal.            Assessment & Plan:    See Problem List for Assessment and Plan of chronic medical problems.    This visit occurred during the SARS-CoV-2 public health emergency.  Safety protocols were in place, including screening questions prior to the visit, additional usage of staff PPE, and extensive cleaning of exam room while observing appropriate contact time as indicated for disinfecting solutions.

## 2020-04-29 ENCOUNTER — Other Ambulatory Visit: Payer: Self-pay | Admitting: *Deleted

## 2020-04-29 ENCOUNTER — Ambulatory Visit (INDEPENDENT_AMBULATORY_CARE_PROVIDER_SITE_OTHER): Payer: Medicare Other | Admitting: Internal Medicine

## 2020-04-29 ENCOUNTER — Other Ambulatory Visit: Payer: Self-pay

## 2020-04-29 ENCOUNTER — Encounter: Payer: Self-pay | Admitting: Internal Medicine

## 2020-04-29 VITALS — BP 130/60 | HR 62 | Temp 98.8°F | Resp 16 | Ht 60.0 in | Wt 115.0 lb

## 2020-04-29 DIAGNOSIS — I6930 Unspecified sequelae of cerebral infarction: Secondary | ICD-10-CM

## 2020-04-29 DIAGNOSIS — R2689 Other abnormalities of gait and mobility: Secondary | ICD-10-CM | POA: Diagnosis not present

## 2020-04-29 DIAGNOSIS — I200 Unstable angina: Secondary | ICD-10-CM | POA: Diagnosis not present

## 2020-04-29 DIAGNOSIS — E039 Hypothyroidism, unspecified: Secondary | ICD-10-CM | POA: Diagnosis not present

## 2020-04-29 DIAGNOSIS — I10 Essential (primary) hypertension: Secondary | ICD-10-CM

## 2020-04-29 DIAGNOSIS — R5381 Other malaise: Secondary | ICD-10-CM | POA: Diagnosis not present

## 2020-04-29 LAB — TSH: TSH: 36.71 u[IU]/mL — ABNORMAL HIGH (ref 0.35–4.50)

## 2020-04-29 NOTE — Assessment & Plan Note (Signed)
Chronic continue plavix, statin Will order home PT to improve physical conditioning, balance, strength

## 2020-04-29 NOTE — Patient Outreach (Signed)
[facility]) Care Management  04/29/2020  Janet Steele Apr 26, 1929 [id number]   Call placed to member's son to follow up on member's current medical status.  He report he was with her a couple days ago and she was "doing fine."  Informed per chart she has appointment with PCP today in reference to member's husband calling office to request PT services.  Home health was ordered when member was initially discharged from rehab but member refused services at that time.  He was not aware of this issue or request and will follow up with member.  This care manager will follow up with member and/or son within the next week to confirm services has started.  Valente David, [place], MSN [place] [phone]

## 2020-04-29 NOTE — Assessment & Plan Note (Signed)
Home PT ordered

## 2020-04-29 NOTE — Assessment & Plan Note (Signed)
Chronic  Medication dose adjusted two months ago Clinically euthyroid Check tsh  Titrate med dose if needed

## 2020-04-29 NOTE — Patient Instructions (Addendum)
  Blood work was ordered.     Medications reviewed and updated.  Changes include :   none   A referral was ordered for PT at home.     Someone will call you to schedule this.    Please followup in 6 months

## 2020-04-29 NOTE — Assessment & Plan Note (Signed)
Chronic BP well controlled Current regimen effective and well tolerated Continue current medications at current doses  

## 2020-05-06 ENCOUNTER — Telehealth: Payer: Self-pay

## 2020-05-06 NOTE — Telephone Encounter (Signed)
Agree with recommendations.  Patient can be seen in office if symptoms recur.

## 2020-05-06 NOTE — Telephone Encounter (Signed)
New message    The husband calls voiced his wife C/O nausea/vomiting.   The husband is aware the appt will need to be virtual, the husband has a cell phone it does not work for that purpose.   The husband voiced we are no longer in [place].   No virtual  appt is made   Advise sending a message around to the [place].

## 2020-05-06 NOTE — Telephone Encounter (Signed)
Spoke with husband in regards. Pt states she has not thrown up since 4 am this morning. I advised husband to make sure pt was drinking plenty of fluids. Husband was going to try to get some Pedialyte. Pt has not had any constipation, diarrhea, fever or chills. Only nausea and vomiting the last 3 days. She feels better today. I told husband to monitor sxs today and if they reoccur pt would need to be seen. Husband expressed understanding.

## 2020-05-08 ENCOUNTER — Other Ambulatory Visit: Payer: Self-pay | Admitting: *Deleted

## 2020-05-08 NOTE — Patient Outreach (Signed)
[facility]) Care Management  05/08/2020  ANIZA SHOR 1929-05-02 [id number]   Call placed to member's son to follow up on recent visit to PCP office, no answer.  HIPAA compliant voice message left.  Will follow up within the next 3-4 business days.  Valente David, [place], MSN [place] [phone]

## 2020-05-09 ENCOUNTER — Ambulatory Visit (INDEPENDENT_AMBULATORY_CARE_PROVIDER_SITE_OTHER): Payer: Medicare Other | Admitting: Internal Medicine

## 2020-05-09 ENCOUNTER — Other Ambulatory Visit: Payer: Self-pay

## 2020-05-09 ENCOUNTER — Encounter: Payer: Self-pay | Admitting: Internal Medicine

## 2020-05-09 VITALS — BP 118/54 | HR 74 | Temp 98.2°F | Resp 14 | Ht 60.0 in

## 2020-05-09 DIAGNOSIS — R1030 Lower abdominal pain, unspecified: Secondary | ICD-10-CM | POA: Diagnosis not present

## 2020-05-09 DIAGNOSIS — I200 Unstable angina: Secondary | ICD-10-CM | POA: Diagnosis not present

## 2020-05-09 DIAGNOSIS — R1110 Vomiting, unspecified: Secondary | ICD-10-CM | POA: Insufficient documentation

## 2020-05-09 DIAGNOSIS — R112 Nausea with vomiting, unspecified: Secondary | ICD-10-CM

## 2020-05-09 LAB — CBC WITH DIFFERENTIAL/PLATELET
Basophils Absolute: 0 10*3/uL (ref 0.0–0.1)
Basophils Relative: 0.3 % (ref 0.0–3.0)
Eosinophils Absolute: 0.1 10*3/uL (ref 0.0–0.7)
Eosinophils Relative: 1 % (ref 0.0–5.0)
HCT: 33 % — ABNORMAL LOW (ref 36.0–46.0)
Hemoglobin: 10.9 g/dL — ABNORMAL LOW (ref 12.0–15.0)
Lymphocytes Relative: 19.4 % (ref 12.0–46.0)
Lymphs Abs: 1.2 10*3/uL (ref 0.7–4.0)
MCHC: 33.1 g/dL (ref 30.0–36.0)
MCV: 80.2 fl (ref 78.0–100.0)
Monocytes Absolute: 0.4 10*3/uL (ref 0.1–1.0)
Monocytes Relative: 6 % (ref 3.0–12.0)
Neutro Abs: 4.5 10*3/uL (ref 1.4–7.7)
Neutrophils Relative %: 73.3 % (ref 43.0–77.0)
Platelets: 279 10*3/uL (ref 150.0–400.0)
RBC: 4.12 Mil/uL (ref 3.87–5.11)
RDW: 16.5 % — ABNORMAL HIGH (ref 11.5–15.5)
WBC: 6.2 10*3/uL (ref 4.0–10.5)

## 2020-05-09 LAB — COMPREHENSIVE METABOLIC PANEL
ALT: 9 U/L (ref 0–35)
AST: 13 U/L (ref 0–37)
Albumin: 4.4 g/dL (ref 3.5–5.2)
Alkaline Phosphatase: 105 U/L (ref 39–117)
BUN: 26 mg/dL — ABNORMAL HIGH (ref 6–23)
CO2: 22 mEq/L (ref 19–32)
Calcium: 9.7 mg/dL (ref 8.4–10.5)
Chloride: 102 mEq/L (ref 96–112)
Creatinine, Ser: 1.36 mg/dL — ABNORMAL HIGH (ref 0.40–1.20)
GFR: 36.65 mL/min — ABNORMAL LOW (ref >60.00)
Glucose, Bld: 176 mg/dL — ABNORMAL HIGH (ref 70–99)
Potassium: 4.5 mEq/L (ref 3.5–5.1)
Sodium: 135 mEq/L (ref 135–145)
Total Bilirubin: 0.9 mg/dL (ref 0.2–1.2)
Total Protein: 7.4 g/dL (ref 6.0–8.3)

## 2020-05-09 LAB — POCT URINALYSIS DIPSTICK
Blood, UA: NEGATIVE
Glucose, UA: NEGATIVE
Ketones, UA: NEGATIVE
Leukocytes, UA: NEGATIVE
Nitrite, UA: NEGATIVE
Protein, UA: POSITIVE — AB
Spec Grav, UA: 1.02 (ref 1.010–1.025)
Urobilinogen, UA: NEGATIVE E.U./dL — AB
pH, UA: 5 (ref 5.0–8.0)

## 2020-05-09 MED ORDER — ONDANSETRON 4 MG PO TBDP: 4.0000 mg | ORAL_TABLET | Freq: Three times a day (TID) | ORAL | 0 refills | Status: DC | PRN

## 2020-05-09 MED ORDER — CIPROFLOXACIN HCL 500 MG PO TABS: 500.0000 mg | ORAL_TABLET | Freq: Two times a day (BID) | ORAL | 0 refills | Status: DC

## 2020-05-09 MED ORDER — CIPROFLOXACIN HCL 250 MG PO TABS: 250.0000 mg | ORAL_TABLET | Freq: Two times a day (BID) | ORAL | 0 refills | Status: DC

## 2020-05-09 NOTE — Patient Instructions (Addendum)
  Blood work was ordered.     Medications reviewed and updated.  Changes include :   zofran as needed for nausea.  cipro - an antibiotic.  Start miralax daily   Your prescription(s) have been submitted to your pharmacy. Please take as directed and contact our office if you believe you are having problem(s) with the medication(s).  Call Monday if your symptoms are not improved.     Go to the ED if your symptoms worsen.

## 2020-05-09 NOTE — Progress Notes (Signed)
Subjective:    Patient ID: Janet Steele, female    DOB: 1929/10/15, 86 y.o.   MRN: [id number]  HPI The patient is here for an acute visit.  She is here with her husband and neither are very good historians.   For just over one week she started having nausea and vomiting.  She vomits a couple of times a day mostly in the evening.   She has abdominal pain in her lower abdomen.   She thinks the pain is slightly better.  She does have some urinary frequency, but denies any pain with urination or blood in urine.  At 1 point she says her appetite is normal, but at other times she says it is not very good.  She denies any fevers or chills, but typically does not get fevers.  She has had some constipation recently.  She denies any blood in the stool or black stool.  She states some headaches, lightheadedness and dizziness, but all of these are not new.   Medications and allergies reviewed with patient and updated if appropriate.  Patient Active Problem List   Diagnosis Date Noted  . Poor balance 04/29/2020  . Physical deconditioning 04/29/2020  . Cold extremities 02/19/2020  . AKI (acute kidney injury) ([place]) 01/15/2020  . Colitis 01/15/2020  . Headache 07/21/2019  . Lower abdominal pain 02/05/2019  . Other headache syndrome 08/14/2018  . Depression 08/14/2018  . Weight loss 06/05/2018  . Stroke ([place]) 11/04/2017  . Weakness 11/03/2017  . History of cerebrovascular accident (CVA) with residual deficit 11/03/2017  . Chronic back pain 07/04/2017  . PAD (peripheral artery disease) ([place]) 01/17/2017  . Overactive bladder 12/17/2016  . Anemia 05/20/2016  . Coronary artery disease involving coronary bypass graft of native heart without angina pectoris 05/19/2016  . Numbness and tingling of foot 12/31/2015  . MCI (mild cognitive impairment) with memory loss 11/18/2015  . Facial tic 05/07/2015  . Sick sinus syndrome ([place]) 02/26/2015  . Cervical dystonia 12/04/2014  . Chronic motor tic  09/12/2014  . Pacemaker-Medtronic 07/07/2012  . GERD (gastroesophageal reflux disease) 07/05/2012  . Cough 08/04/2010  . Abnormal involuntary movement 12/17/2009  . Disturbance in sleep behavior 12/11/2009  . CAD, ARTERY BYPASS GRAFT 09/24/2009  . CAROTID BRUIT 09/24/2009  . Diabetes ([place]) 01/30/2009  . Anxiety 02/14/2008  . EROSIVE ESOPHAGITIS 02/14/2008  . CONSTIPATION, CHRONIC 02/14/2008  . DEGENERATIVE JOINT DISEASE 02/14/2008  . HEARING LOSS 01/04/2008  . PVD 01/04/2008  . Hypothyroidism 09/25/2007  . HYPERLIPIDEMIA 09/25/2007  . Essential hypertension 09/07/2007  . Diabetic polyneuropathy ([place]) 03/16/2007  . Stricture and stenosis of esophagus 03/16/2007  . HIATAL HERNIA 02/16/2007  . HEMORRHOIDS 12/31/1997  . DIVERTICULOSIS, COLON 12/31/1997    Current Outpatient Medications on File Prior to Visit  Medication Sig Dispense Refill  . atorvastatin (LIPITOR) 80 MG tablet TAKE 1 TABLET BY MOUTH EVERY DAY (Patient taking differently: Take 80 mg by mouth daily. ) 90 tablet 1  . clopidogrel (PLAVIX) 75 MG tablet TAKE 1 TABLET BY MOUTH EVERY DAY 90 tablet 0  . DULoxetine (CYMBALTA) 60 MG capsule TAKE 1 CAPSULE(60 MG) BY MOUTH DAILY (Patient taking differently: Take 60 mg by mouth daily. ) 90 capsule 0  . gabapentin (NEURONTIN) 100 MG capsule Take 2 capsules (200 mg total) by mouth 2 (two) times daily. 120 capsule 6  . JANUVIA 50 MG tablet Take 50 mg by mouth daily.  0  . levothyroxine (SYNTHROID) 88 MCG tablet TAKE 1 TABLET(88 MCG) BY  MOUTH DAILY (Patient taking differently: Take 88 mcg by mouth daily. ) 90 tablet 0  . Melatonin 5 MG TABS Take by mouth.    . metoprolol tartrate (LOPRESSOR) 25 MG tablet TAKE 1 TABLET BY MOUTH TWICE DAILY 180 tablet 0  . nitroGLYCERIN (NITROSTAT) 0.4 MG SL tablet Place 1 tablet (0.4 mg total) under the tongue every 5 (five) minutes as needed. For chest pain 25 tablet 5  . pantoprazole (PROTONIX) 40 MG tablet Take 1 tablet (40 mg total) by mouth daily.  90 tablet 1   No current facility-administered medications on file prior to visit.    Past Medical History:  Diagnosis Date  . Anemia   . Anxiety   . Arthritis    "fingers" (05/19/2016  . CAD (coronary artery disease)    a. s/p CABG 1982 b. redo CABG 2003. c. [place] despite med rx 05/2016: successful but complicated PCI of PDA beyond graft site, c/b localized dissection requiring overlapping stent.  . Carotid bruit    a. Duplex 01/2015: patent vessels, 1-39% BICA, f/u PRN recommened.  . Colon polyp    adenomatous  . Colon, diverticulosis   . Degenerative joint disease   . Diastolic dysfunction   . Esophageal stricture   . GERD (gastroesophageal reflux disease)   . Hiatal hernia   . History of blood transfusion    "related to my bypass"  . Hyperlipidemia   . Hypertension   . Hypothyroidism   . Ischemic cardiomyopathy    a. Cath 05/2016: EF 40% with severe inferior wall HK, suspect hibernating myocardium.  . Myocardial infarction ([place]) 1977; 1982  . Osteoarthrosis, unspecified whether generalized or localized, unspecified site   . Presence of permanent cardiac pacemaker   . PVD (peripheral vascular disease) ([place])   . Type II diabetes mellitus ([place])   . Unspecified hearing loss     Past Surgical History:  Procedure Laterality Date  . ABDOMINAL HYSTERECTOMY    . APPENDECTOMY    . BACK SURGERY    . CARDIAC CATHETERIZATION N/A 05/19/2016   Procedure: Left Heart Cath and Cors/Grafts Angiography;  Surgeon: Belva Crome, MD;  Location: [place] [facility];  Service: Cardiovascular;  Laterality: N/A;  . CARDIAC CATHETERIZATION N/A 05/19/2016   Procedure: Coronary Stent Intervention;  Surgeon: Belva Crome, MD;  Location: [facility];  Service: Cardiovascular;  Laterality: N/A;  . CATARACT EXTRACTION W/ INTRAOCULAR LENS  IMPLANT, BILATERAL Bilateral   . CHOLECYSTECTOMY OPEN    . COLONOSCOPY    . CORONARY ANGIOPLASTY WITH STENT PLACEMENT  05/19/2016   "2 stents?"  . CORONARY  ARTERY BYPASS GRAFT  1982; 2003   x4(1982),02-2002 CABG X2  . ESOPHAGOGASTRODUODENOSCOPY (EGD) WITH ESOPHAGEAL DILATION  "2-3 times"  . INSERT / REPLACE / REMOVE PACEMAKER    . LEFT HEART CATHETERIZATION WITH CORONARY/GRAFT ANGIOGRAM N/A 03/05/2014   Procedure: LEFT HEART CATHETERIZATION WITH Beatrix Fetters;  Surgeon: Sinclair Grooms, MD;  Location: [facility] CATH [facility];  Service: Cardiovascular;  Laterality: N/A;  . LUMBAR [place] SURGERY  01-2000  . OOPHORECTOMY Bilateral   . PERMANENT PACEMAKER INSERTION N/A 07/06/2012   MDT Adapta L implanted by Dr Rayann Heman for symptomatic bradycardia  . TONSILLECTOMY  1930s    Social History   Socioeconomic History  . Marital status: Married    Spouse name: [name]  . Number of children: 3  . Years of education: 25  . Highest education level: Not on file  Occupational History  . Occupation: Retired  Employer: RETIRED  Tobacco Use  . Smoking status: Former Smoker    Packs/day: 0.50    Years: 30.00    Pack years: 15.00    Types: Cigarettes    Quit date: 07/05/1976    Years since quitting: 43.8  . Smokeless tobacco: Never Used  Substance and Sexual Activity  . Alcohol use: No    Alcohol/week: 3.0 standard drinks    Types: 3 Glasses of wine per week    Comment: "not anymore, I haven't in I don't know years"  . Drug use: No  . Sexual activity: Never  Other Topics Concern  . Not on file  Social History Narrative   Patient is married [name]) and lives at home with her husband.   Patient has three adult children.   Patient is retired.   Patient has a college education.   Patient is right-handed.   Patient does not drink any caffeine.   Social Determinants of Health   Financial Resource Strain:   . Difficulty of Paying Living Expenses:   Food Insecurity: No Food Insecurity  . Worried About Charity fundraiser in the Last Year: Never true  . Ran Out of Food in the Last Year: Never true  Transportation Needs: No Transportation Needs    . Lack of Transportation (Medical): No  . Lack of Transportation (Non-Medical): No  Physical Activity:   . Days of Exercise per Week:   . Minutes of Exercise per Session:   Stress:   . Feeling of Stress :   Social Connections:   . Frequency of Communication with Friends and Family:   . Frequency of Social Gatherings with Friends and Family:   . Attends Religious Services:   . Active Member of Clubs or Organizations:   . Attends Archivist Meetings:   Marland Kitchen Marital Status:     Family History  Problem Relation Age of Onset  . Heart disease Sister   . Heart attack Sister   . Colon cancer Neg Hx   . Breast cancer Neg Hx   . Celiac disease Neg Hx   . Cirrhosis Neg Hx   . Clotting disorder Neg Hx   . Colitis Neg Hx   . Colon polyps Neg Hx   . Crohn's disease Neg Hx   . Cystic fibrosis Neg Hx   . Diabetes Neg Hx   . Esophageal cancer Neg Hx   . Hemochromatosis Neg Hx   . Inflammatory bowel disease Neg Hx   . Irritable bowel syndrome Neg Hx   . Kidney disease Neg Hx   . Liver cancer Neg Hx   . Liver disease Neg Hx   . Ovarian cancer Neg Hx   . Pancreatic cancer Neg Hx   . Prostate cancer Neg Hx   . Rectal cancer Neg Hx   . Stomach cancer Neg Hx   . Ulcerative colitis Neg Hx   . Uterine cancer Neg Hx   . Wilson's disease Neg Hx     Review of Systems  Constitutional: Negative for appetite change, chills and fever.  Gastrointestinal: Positive for abdominal pain, constipation, nausea and vomiting. Negative for blood in stool (no black stool) and diarrhea.  Genitourinary: Positive for frequency. Negative for dysuria and hematuria.       No cloudy urine  Neurological: Positive for dizziness, light-headedness and headaches (chronic, every afternoon).       Objective:   Vitals:   05/09/20 1429  BP: (!) 118/54  Pulse: 74  Resp:  14  Temp: 98.2 F (36.8 C)  SpO2: 96%   BP Readings from Last 3 Encounters:  05/09/20 (!) 118/54  04/29/20 130/60  02/28/20 103/63    Wt Readings from Last 3 Encounters:  04/29/20 115 lb (52.2 kg)  02/28/20 114 lb (51.7 kg)  02/19/20 116 lb (52.6 kg)   Body mass index is 22.46 kg/m.   Physical Exam Constitutional:      General: She is not in acute distress.    Appearance: She is well-developed. She is not ill-appearing.  HENT:     Head: Normocephalic and atraumatic.  Cardiovascular:     Rate and Rhythm: Normal rate and regular rhythm.  Pulmonary:     Effort: Pulmonary effort is normal. No respiratory distress.     Breath sounds: No wheezing or rales.  Abdominal:     Palpations: Abdomen is soft.     Tenderness: There is abdominal tenderness in the suprapubic area and left lower quadrant. There is no guarding or rebound.  Skin:    General: Skin is warm and dry.  Neurological:     Mental Status: She is alert.            Assessment & Plan:   Nausea/vomiting and lower abdominal pain: Acute-started 1 week ago Has had a history of colitis and urinary tract infections and this could represent either She is not toxic looking It is Friday afternoon and will be very difficult to get imaging Discussed with her and her husband that if her symptoms worsen or do not improve she may need to go to the emergency room over the weekend Urine dip here not suggestive of a UTI-we will send for culture We will then obtain a CBC, CMP Colitis, UTI, constipation, viral gastroenteritis all possibilities-it is very difficult to say without imaging of the abdomen and blood work We will start Cipro in the event that this is a UTI or possible colitis Advised her to up her bowel regimen-typically she can needs a new meals or take something she has at home and it helps and she will start that Increase fluids Zofran as needed Again stressed that if her symptoms worsen she does need to go to the emergency room this weekend If she is still having symptoms on Monday she will call and we will pursue a CT scan   See Problem List  for Assessment and Plan of chronic medical problems.    This visit occurred during the SARS-CoV-2 public health emergency.  Safety protocols were in place, including screening questions prior to the visit, additional usage of staff PPE, and extensive cleaning of exam room while observing appropriate contact time as indicated for disinfecting solutions.

## 2020-05-10 ENCOUNTER — Other Ambulatory Visit: Payer: Self-pay | Admitting: Internal Medicine

## 2020-05-10 LAB — URINE CULTURE

## 2020-05-12 ENCOUNTER — Telehealth: Payer: Self-pay | Admitting: Internal Medicine

## 2020-05-12 ENCOUNTER — Other Ambulatory Visit: Payer: Self-pay | Admitting: Internal Medicine

## 2020-05-12 DIAGNOSIS — R112 Nausea with vomiting, unspecified: Secondary | ICD-10-CM

## 2020-05-12 DIAGNOSIS — R1030 Lower abdominal pain, unspecified: Secondary | ICD-10-CM

## 2020-05-12 NOTE — Telephone Encounter (Signed)
A Ct scan was ordered to evaluate her pain.  They will call them today to schedule this.

## 2020-05-12 NOTE — Addendum Note (Signed)
Addended by: Binnie Rail on: 05/12/2020 11:55 AM   Modules accepted: Orders

## 2020-05-12 NOTE — Telephone Encounter (Signed)
    Scott from [facility] states  He will try to see patient later this week if she is feeling better [employer] [phone]

## 2020-05-12 NOTE — Telephone Encounter (Signed)
Patient was seen on 6/4 and states Dr.Burns requested them to call and report how the pain is on Monday 6/7. Husband is reporting that patients stomach pain is not any better, in fact it is worse.  Requesting a call back to advise.

## 2020-05-13 ENCOUNTER — Other Ambulatory Visit: Payer: Self-pay | Admitting: *Deleted

## 2020-05-13 NOTE — Telephone Encounter (Signed)
Husband called back to follow up.  Informed of information below. He understood.

## 2020-05-13 NOTE — Patient Outreach (Signed)
[facility]) Care Management  05/13/2020  Janet Steele 08-Feb-1929 [id number]   Outreach attempt #2, unsuccessful.  Call placed to member's son to follow up on recent visit to PCP office, no answer.  HIPAA compliant voice message left.  Call then placed to member's home, she answered phone but wans't able to hear what this care manager was saying and requested call back.  Will send unsuccessful outreach letter and follow up with son within the next 3-4 business days.  Valente David, [place], MSN [place] [phone]

## 2020-05-19 ENCOUNTER — Other Ambulatory Visit: Payer: Self-pay | Admitting: *Deleted

## 2020-05-19 DIAGNOSIS — Z950 Presence of cardiac pacemaker: Secondary | ICD-10-CM | POA: Diagnosis not present

## 2020-05-19 DIAGNOSIS — Z87891 Personal history of nicotine dependence: Secondary | ICD-10-CM | POA: Diagnosis not present

## 2020-05-19 DIAGNOSIS — E1142 Type 2 diabetes mellitus with diabetic polyneuropathy: Secondary | ICD-10-CM | POA: Diagnosis not present

## 2020-05-19 DIAGNOSIS — I25810 Atherosclerosis of coronary artery bypass graft(s) without angina pectoris: Secondary | ICD-10-CM | POA: Diagnosis not present

## 2020-05-19 DIAGNOSIS — I255 Ischemic cardiomyopathy: Secondary | ICD-10-CM | POA: Diagnosis not present

## 2020-05-19 DIAGNOSIS — G8929 Other chronic pain: Secondary | ICD-10-CM | POA: Diagnosis not present

## 2020-05-19 DIAGNOSIS — F329 Major depressive disorder, single episode, unspecified: Secondary | ICD-10-CM | POA: Diagnosis not present

## 2020-05-19 DIAGNOSIS — Z951 Presence of aortocoronary bypass graft: Secondary | ICD-10-CM | POA: Diagnosis not present

## 2020-05-19 DIAGNOSIS — Z7984 Long term (current) use of oral hypoglycemic drugs: Secondary | ICD-10-CM | POA: Diagnosis not present

## 2020-05-19 DIAGNOSIS — I69351 Hemiplegia and hemiparesis following cerebral infarction affecting right dominant side: Secondary | ICD-10-CM | POA: Diagnosis not present

## 2020-05-19 DIAGNOSIS — E039 Hypothyroidism, unspecified: Secondary | ICD-10-CM | POA: Diagnosis not present

## 2020-05-19 DIAGNOSIS — Z7902 Long term (current) use of antithrombotics/antiplatelets: Secondary | ICD-10-CM | POA: Diagnosis not present

## 2020-05-19 DIAGNOSIS — F419 Anxiety disorder, unspecified: Secondary | ICD-10-CM | POA: Diagnosis not present

## 2020-05-19 DIAGNOSIS — G3184 Mild cognitive impairment, so stated: Secondary | ICD-10-CM | POA: Diagnosis not present

## 2020-05-19 DIAGNOSIS — E1151 Type 2 diabetes mellitus with diabetic peripheral angiopathy without gangrene: Secondary | ICD-10-CM | POA: Diagnosis not present

## 2020-05-19 DIAGNOSIS — M1990 Unspecified osteoarthritis, unspecified site: Secondary | ICD-10-CM | POA: Diagnosis not present

## 2020-05-19 DIAGNOSIS — M549 Dorsalgia, unspecified: Secondary | ICD-10-CM | POA: Diagnosis not present

## 2020-05-19 DIAGNOSIS — I119 Hypertensive heart disease without heart failure: Secondary | ICD-10-CM | POA: Diagnosis not present

## 2020-05-19 DIAGNOSIS — I495 Sick sinus syndrome: Secondary | ICD-10-CM | POA: Diagnosis not present

## 2020-05-19 NOTE — Patient Outreach (Signed)
[facility]) Care Management  05/19/2020  SYNIAH BERNE [birth year] [id number]   Outreach attempt #3, unsuccessful.    Call placed to member's son to follow up on recent visit to PCP office, no answer. HIPAA compliant voice message left.  Will follow up within the next 4 weeks.  Valente David, [place], MSN [place] [phone]

## 2020-05-20 ENCOUNTER — Telehealth: Payer: Self-pay | Admitting: Internal Medicine

## 2020-05-20 NOTE — Telephone Encounter (Signed)
Liji with [facility] called and was requesting verbal orders for home health physical therapy for 2 week 4 and 1 week 3. She also reported a level 2 medication interaction, Cipro 250mg  is interacting with Cymbalta 60mg  and Zofran 4mg .

## 2020-05-20 NOTE — Telephone Encounter (Signed)
[facility] for home health orders.   cipro already finished and zofran is temporary and prn

## 2020-05-21 ENCOUNTER — Other Ambulatory Visit: Payer: Self-pay | Admitting: Internal Medicine

## 2020-05-21 DIAGNOSIS — G8929 Other chronic pain: Secondary | ICD-10-CM | POA: Diagnosis not present

## 2020-05-21 DIAGNOSIS — M1990 Unspecified osteoarthritis, unspecified site: Secondary | ICD-10-CM | POA: Diagnosis not present

## 2020-05-21 DIAGNOSIS — I25810 Atherosclerosis of coronary artery bypass graft(s) without angina pectoris: Secondary | ICD-10-CM | POA: Diagnosis not present

## 2020-05-21 DIAGNOSIS — I69351 Hemiplegia and hemiparesis following cerebral infarction affecting right dominant side: Secondary | ICD-10-CM | POA: Diagnosis not present

## 2020-05-21 DIAGNOSIS — E1142 Type 2 diabetes mellitus with diabetic polyneuropathy: Secondary | ICD-10-CM | POA: Diagnosis not present

## 2020-05-21 DIAGNOSIS — M549 Dorsalgia, unspecified: Secondary | ICD-10-CM | POA: Diagnosis not present

## 2020-05-21 NOTE — Telephone Encounter (Signed)
Spoke with Liji this morning and info given.

## 2020-05-22 ENCOUNTER — Other Ambulatory Visit: Payer: Self-pay | Admitting: Internal Medicine

## 2020-05-26 DIAGNOSIS — M549 Dorsalgia, unspecified: Secondary | ICD-10-CM | POA: Diagnosis not present

## 2020-05-26 DIAGNOSIS — M1990 Unspecified osteoarthritis, unspecified site: Secondary | ICD-10-CM | POA: Diagnosis not present

## 2020-05-26 DIAGNOSIS — I69351 Hemiplegia and hemiparesis following cerebral infarction affecting right dominant side: Secondary | ICD-10-CM | POA: Diagnosis not present

## 2020-05-26 DIAGNOSIS — I25810 Atherosclerosis of coronary artery bypass graft(s) without angina pectoris: Secondary | ICD-10-CM | POA: Diagnosis not present

## 2020-05-26 DIAGNOSIS — E1142 Type 2 diabetes mellitus with diabetic polyneuropathy: Secondary | ICD-10-CM | POA: Diagnosis not present

## 2020-05-26 DIAGNOSIS — G8929 Other chronic pain: Secondary | ICD-10-CM | POA: Diagnosis not present

## 2020-05-28 ENCOUNTER — Ambulatory Visit
Admission: RE | Admit: 2020-05-28 | Discharge: 2020-05-28 | Disposition: A | Discharge status: Home / Self Care | Payer: Medicare Other | Source: Ambulatory Visit | Attending: Internal Medicine | Admitting: Internal Medicine

## 2020-05-28 ENCOUNTER — Other Ambulatory Visit: Payer: Self-pay

## 2020-05-28 DIAGNOSIS — R112 Nausea with vomiting, unspecified: Secondary | ICD-10-CM

## 2020-05-28 DIAGNOSIS — R1030 Lower abdominal pain, unspecified: Secondary | ICD-10-CM

## 2020-05-30 DIAGNOSIS — I69351 Hemiplegia and hemiparesis following cerebral infarction affecting right dominant side: Secondary | ICD-10-CM | POA: Diagnosis not present

## 2020-05-30 DIAGNOSIS — M549 Dorsalgia, unspecified: Secondary | ICD-10-CM | POA: Diagnosis not present

## 2020-05-30 DIAGNOSIS — I25810 Atherosclerosis of coronary artery bypass graft(s) without angina pectoris: Secondary | ICD-10-CM | POA: Diagnosis not present

## 2020-05-30 DIAGNOSIS — G8929 Other chronic pain: Secondary | ICD-10-CM | POA: Diagnosis not present

## 2020-05-30 DIAGNOSIS — E1142 Type 2 diabetes mellitus with diabetic polyneuropathy: Secondary | ICD-10-CM | POA: Diagnosis not present

## 2020-05-30 DIAGNOSIS — M1990 Unspecified osteoarthritis, unspecified site: Secondary | ICD-10-CM | POA: Diagnosis not present

## 2020-06-02 ENCOUNTER — Encounter: Payer: Self-pay | Admitting: Internal Medicine

## 2020-06-02 ENCOUNTER — Ambulatory Visit (INDEPENDENT_AMBULATORY_CARE_PROVIDER_SITE_OTHER): Payer: Medicare Other | Admitting: Internal Medicine

## 2020-06-02 VITALS — BP 104/52 | HR 74 | Ht 60.0 in | Wt 110.0 lb

## 2020-06-02 DIAGNOSIS — I200 Unstable angina: Secondary | ICD-10-CM | POA: Diagnosis not present

## 2020-06-02 DIAGNOSIS — R1310 Dysphagia, unspecified: Secondary | ICD-10-CM

## 2020-06-02 DIAGNOSIS — Z7902 Long term (current) use of antithrombotics/antiplatelets: Secondary | ICD-10-CM | POA: Diagnosis not present

## 2020-06-02 DIAGNOSIS — K219 Gastro-esophageal reflux disease without esophagitis: Secondary | ICD-10-CM | POA: Diagnosis not present

## 2020-06-02 NOTE — Patient Instructions (Signed)
If you are age 84 or older, your body mass index should be between 23-30. Your Body mass index is 21.48 kg/m. If this is out of the aforementioned range listed, please consider follow up with your Primary Care Provider.  If you are age 53 or younger, your body mass index should be between 19-25. Your Body mass index is 21.48 kg/m. If this is out of the aformentioned range listed, please consider follow up with your Primary Care Provider.   We will be contacting both your Primary care physician and your Cardiologist about proceding with an Endoscopy. Once our office has heard back from both of them we will contact you to schedule you for your procedure.  Thank you for choosing Dr. Scarlette Shorts and [facility].

## 2020-06-02 NOTE — Progress Notes (Signed)
I'm fine with her holding plavix John - your plan sounds great - thanks, Ronalee Belts

## 2020-06-02 NOTE — Progress Notes (Signed)
HISTORY OF PRESENT ILLNESS:  Janet Steele is a 86 y.o. female with multiple significant medical problems as listed below.  Since I last saw her, she has had a stroke.  Recently saw her husband in the office.  Chief complaints today include recurrent worsening dysphagia and intermittent coughing spells.  She has had this problem for the past.  She is known to have GERD with large hiatal hernia, presbyesophagus, and esophageal stricture.  She also likely has esophageal dysmotility.  She has had benefit from esophageal dilation in the past.  She last underwent balloon dilation of the esophagus to 19 mm November 2016.  See that report.  She has significant issues with her memory.  Fair historian.  Blood work from May 09, 2020 reveals hemoglobin 10.9.  Platelet count 79,000.  CT scan of the abdomen pelvis January 15, 2020 evaluate diarrhea leukocytosis revealed nonspecified colitis.  She improved.  Medication list states she takes metoprolol for chronic GERD.  40 mg daily.  She cannot confirm this, but think so.  She requires a walker and wheelchair to get around.  She did see her cardiologist Dr. Burt Knack in December 2020.  Primary care provider is Dr. Quay Burow.  REVIEW OF SYSTEMS:  All non-GI ROS negative unless otherwise stated in the HPI except for arthritis, memory issues  Past Medical History:  Diagnosis Date  . Anemia   . Anxiety   . Arthritis    "fingers" (05/19/2016  . CAD (coronary artery disease)    a. s/p CABG 1982 b. redo CABG 2003. c. [place] despite med rx 05/2016: successful but complicated PCI of PDA beyond graft site, c/b localized dissection requiring overlapping stent.  . Carotid bruit    a. Duplex 01/2015: patent vessels, 1-39% BICA, f/u PRN recommened.  . Colon polyp    adenomatous  . Colon, diverticulosis   . Degenerative joint disease   . Diastolic dysfunction   . Esophageal stricture   . GERD (gastroesophageal reflux disease)   . Hiatal hernia   . History of blood transfusion     "related to my bypass"  . Hyperlipidemia   . Hypertension   . Hypothyroidism   . Ischemic cardiomyopathy    a. Cath 05/2016: EF 40% with severe inferior wall HK, suspect hibernating myocardium.  . Myocardial infarction ([place]) 1977; 1982  . Osteoarthrosis, unspecified whether generalized or localized, unspecified site   . Presence of permanent cardiac pacemaker   . PVD (peripheral vascular disease) ([place])   . Type II diabetes mellitus ([place])   . Unspecified hearing loss     Past Surgical History:  Procedure Laterality Date  . ABDOMINAL HYSTERECTOMY    . APPENDECTOMY    . BACK SURGERY    . CARDIAC CATHETERIZATION N/A 05/19/2016   Procedure: Left Heart Cath and Cors/Grafts Angiography;  Surgeon: Belva Crome, MD;  Location: [facility];  Service: Cardiovascular;  Laterality: N/A;  . CARDIAC CATHETERIZATION N/A 05/19/2016   Procedure: Coronary Stent Intervention;  Surgeon: Belva Crome, MD;  Location: [place] [facility];  Service: Cardiovascular;  Laterality: N/A;  . CATARACT EXTRACTION W/ INTRAOCULAR LENS  IMPLANT, BILATERAL Bilateral   . CHOLECYSTECTOMY OPEN    . COLONOSCOPY    . CORONARY ANGIOPLASTY WITH STENT PLACEMENT  05/19/2016   "2 stents?"  . CORONARY ARTERY BYPASS GRAFT  1982; 2003   x4(1982),02-2002 CABG X2  . ESOPHAGOGASTRODUODENOSCOPY (EGD) WITH ESOPHAGEAL DILATION  "2-3 times"  . INSERT / REPLACE / REMOVE PACEMAKER    .  LEFT HEART CATHETERIZATION WITH CORONARY/GRAFT ANGIOGRAM N/A 03/05/2014   Procedure: LEFT HEART CATHETERIZATION WITH Beatrix Fetters;  Surgeon: Sinclair Grooms, MD;  Location: [facility];  Service: Cardiovascular;  Laterality: N/A;  . LUMBAR [place] SURGERY  01-2000  . OOPHORECTOMY Bilateral   . PERMANENT PACEMAKER INSERTION N/A 07/06/2012   MDT Adapta L implanted by Dr Rayann Heman for symptomatic bradycardia  . TONSILLECTOMY  1930s    Social History JERONDA DON  reports that she quit smoking about 43 years ago. Her smoking use included  cigarettes. She has a 15.00 pack-year smoking history. She has never used smokeless tobacco. She reports that she does not drink alcohol and does not use drugs.  family history includes Heart attack in her sister; Heart disease in her sister.  Allergies  Allergen Reactions  . Morphine Hives, Itching and Rash  . Penicillins Itching and Rash    Has patient had a PCN reaction causing immediate rash, facial/tongue/throat swelling, SOB or lightheadedness with hypotension: Yes Has patient had a PCN reaction causing severe rash involving mucus membranes or skin necrosis: No Has patient had a PCN reaction that required hospitalization No Has patient had a PCN reaction occurring within the last 10 years: No If all of the above answers are "NO", then may proceed with Cephalosporin use.   . Prednisone Other (See Comments)    MEMORY LOSS   . Sulfonamide Derivatives Itching and Rash  . Codeine Rash  . Hydrocodone-Acetaminophen Other (See Comments)    unknown  . Meperidine Hcl Itching  . Metoclopramide Hcl Itching and Rash  . Metoprolol Succinate Other (See Comments)    nervous  . Moxifloxacin Itching  . Oxycodone-Aspirin Rash  . Pentazocine Lactate Nausea Only  . Pioglitazone Other (See Comments)    bloating       PHYSICAL EXAMINATION: Vital signs: BP (!) 104/52 (BP Location: Left Arm, Patient Position: Sitting, Cuff Size: Normal)   Pulse 74   Ht 5' (1.524 m)   Wt 110 lb (49.9 kg)   SpO2 98%   BMI 21.48 kg/m   Constitutional: Thin female in wheelchair, no acute distress Psychiatric: alert and oriented x3, cooperative Eyes: extraocular movements intact, anicteric, conjunctiva pink Mouth: oral pharynx moist, no lesions Neck: supple no lymphadenopathy Cardiovascular: heart regular rate and rhythm, no murmur Lungs: clear to auscultation bilaterally Abdomen: soft, nontender, nondistended, no obvious ascites, no peritoneal signs, normal bowel sounds, no organomegaly Rectal:  Omitted Extremities: no clubbing or cyanosis.  Trace lower extremity edema bilaterally Skin: no lesions on visible extremities Neuro: No focal deficits.  Cranial nerves intact  ASSESSMENT:  1.  Chronic GERD. 2.  Chronic coughing or choking spells.  May have an element of oropharyngeal dysphagia post stroke 3.  Chronic dysphagia.  No esophageal stricture.  Previous dilations of help.  Also has underlying dysmotility 4.  Multiple significant medical problems including but not limited to coronary artery disease, prior stroke, diabetes 5.  On chronic Plavix   PLAN:  1.  Upper endoscopy with esophageal dilation.  The patient is HIGH RISK.  She is high risk given her age, comorbidities, need to address Plavix therapy.The nature of the procedure, as well as the risks, benefits, and alternatives were carefully and thoroughly reviewed with the patient. Ample time for discussion and questions allowed. The patient understood, was satisfied, and agreed to proceed.  HOWEVER, we will hold off on scheduling at this time until we confer with her other physicians regarding the feasibility of holding Plavix  for 1 week prior to her procedure.  She is not on daily aspirin, but could be placed on aspirin therapy as a bridge.  She tolerates aspirin and has aspirin at home.  Decide on moving forward with her procedure after receiving clinical input. 2.  Reflux precautions 3.  Take pantoprazole daily 4.  Chew food well 5.  Copy of this note will be sent to Dr. Burt Knack and Dr. Quay Burow regarding the feasibility of holding Plavix for 1 week prior to consideration of dilation therapy.

## 2020-06-03 ENCOUNTER — Ambulatory Visit: Payer: Medicare Other | Admitting: Family Medicine

## 2020-06-03 NOTE — Progress Notes (Signed)
Spoke with pts husband and he is aware. Pt scheduled for previsit 07/02/20@1 :30pm. EGD with dil scheduled in the [place] 07/23/20@2 :30pm. Offered sooner 10am appt but husband states she hates mornings. He is aware of both appts.

## 2020-06-03 NOTE — Progress Notes (Signed)
Janet Steele,  I saw this patient in the office yesterday.  I reached out to her cardiologist and primary care physician regarding her Plavix therapy.  They said that she could stop Plavix for her EGD with dilation.  I told the patient we would be in touch, after I heard from her physicians.  As such, please do the following: 1.  Contact patient and schedule her for EGD with dilation in [place].  She has completed her Covid vaccination series. 2.  Have her hold Plavix 1 week prior to the procedure 3.  Have her start 1 baby aspirin daily at the time that she stops Plavix. Note: I know her and her family well.  Unfortunately, her memory is not optimal.  It may be best if you talk with her and her husband concurrently.  Thanks.  Dr. Henrene Pastor

## 2020-06-04 ENCOUNTER — Other Ambulatory Visit: Payer: Self-pay | Admitting: Internal Medicine

## 2020-06-04 ENCOUNTER — Ambulatory Visit (INDEPENDENT_AMBULATORY_CARE_PROVIDER_SITE_OTHER): Payer: Medicare Other

## 2020-06-04 DIAGNOSIS — Z0000 Encounter for general adult medical examination without abnormal findings: Secondary | ICD-10-CM | POA: Diagnosis not present

## 2020-06-04 NOTE — Progress Notes (Signed)
I connected with Janet Steele today by telephone and verified that I am speaking with the correct person using two identifiers. Location patient: home Location provider: work Persons participating in the virtual visit: Janet Steele  I discussed the limitations, risks, security and privacy concerns of performing an evaluation and management service by telephone and the availability of in person appointments. I also discussed with the patient that there may be a patient responsible charge related to this service. The patient expressed understanding and verbally consented to this telephonic visit.    Interactive audio and video telecommunications were attempted between this provider and patient, however failed, due to patient having technical difficulties OR patient did not have access to video capability.  We continued and completed visit with audio only.  Some vital signs may be absent or patient reported.   Time Spent with patient on telephone encounter: 30 minutes  Subjective:   Janet Steele is a 86 y.o. female who presents for Medicare Annual (Subsequent) preventive examination.  Review of Systems    No ROS. Medicare Wellness Virtual Visit. Additional risk factors are reflected in social history. Cardiac Risk Factors include: advanced age (>71men, >6 women);diabetes mellitus;dyslipidemia;family history of premature cardiovascular disease;hypertension     Objective:    Today's Vitals   06/04/20 1227  PainSc: 5    There is no height or weight on file to calculate BMI.  Advanced Directives 06/04/2020 02/25/2020 01/24/2020 01/15/2020 01/15/2020 01/02/2019 03/09/2017  Does Patient Have a Medical Advance Directive? Yes Yes Yes Yes Unable to assess, patient is non-responsive or altered mental status Yes Yes  Type of Advance Directive Living will [place];Living will Living will Living will - [place];Living will [place];Living will   Does patient want to make changes to medical advance directive? No - Patient declined No - Guardian declined No - Patient declined No - Patient declined - - -  Copy of [place] in Chart? - - - - - Yes - validated most recent copy scanned in chart (See row information) -  Would patient like information on creating a medical advance directive? - - - - - - -  Pre-existing out of facility DNR order (yellow form or pink MOST form) - - - - - - -    Current Medications (verified) Outpatient Encounter Medications as of 06/04/2020  Medication Sig  . atorvastatin (LIPITOR) 80 MG tablet TAKE 1 TABLET BY MOUTH EVERY DAY  . clopidogrel (PLAVIX) 75 MG tablet TAKE 1 TABLET BY MOUTH EVERY DAY  . DULoxetine (CYMBALTA) 60 MG capsule TAKE 1 CAPSULE(60 MG) BY MOUTH DAILY  . gabapentin (NEURONTIN) 100 MG capsule Take 2 capsules (200 mg total) by mouth 2 (two) times daily.  Marland Kitchen JANUVIA 50 MG tablet Take 50 mg by mouth daily.  Marland Kitchen levothyroxine (SYNTHROID) 88 MCG tablet TAKE 1 TABLET(88 MCG) BY MOUTH DAILY  . losartan (COZAAR) 25 MG tablet TAKE 1 TABLET(25 MG) BY MOUTH DAILY  . metoprolol tartrate (LOPRESSOR) 25 MG tablet TAKE 1 TABLET BY MOUTH TWICE DAILY  . nitroGLYCERIN (NITROSTAT) 0.4 MG SL tablet Place 1 tablet (0.4 mg total) under the tongue every 5 (five) minutes as needed. For chest pain  . ondansetron (ZOFRAN ODT) 4 MG disintegrating tablet Take 1 tablet (4 mg total) by mouth every 8 (eight) hours as needed for nausea or vomiting.  . pantoprazole (PROTONIX) 40 MG tablet Take 1 tablet (40 mg total) by mouth daily.  . [  DISCONTINUED] Melatonin 5 MG TABS Take by mouth. (Patient not taking: Reported on 06/04/2020)   No facility-administered encounter medications on file as of 06/04/2020.    Allergies (verified) Morphine, Penicillins, Prednisone, Sulfonamide derivatives, Codeine, Hydrocodone-acetaminophen, Meperidine hcl, Metoclopramide hcl, Metoprolol succinate, Moxifloxacin, Oxycodone-aspirin,  Pentazocine lactate, and Pioglitazone   History: Past Medical History:  Diagnosis Date  . Anemia   . Anxiety   . Arthritis    "fingers" (05/19/2016  . CAD (coronary artery disease)    a. s/p CABG 1982 b. redo CABG 2003. c. [place] despite med rx 05/2016: successful but complicated PCI of PDA beyond graft site, c/b localized dissection requiring overlapping stent.  . Carotid bruit    a. Duplex 01/2015: patent vessels, 1-39% BICA, f/u PRN recommened.  . Colon polyp    adenomatous  . Colon, diverticulosis   . Degenerative joint disease   . Diastolic dysfunction   . Esophageal stricture   . GERD (gastroesophageal reflux disease)   . Hiatal hernia   . History of blood transfusion    "related to my bypass"  . Hyperlipidemia   . Hypertension   . Hypothyroidism   . Ischemic cardiomyopathy    a. Cath 05/2016: EF 40% with severe inferior wall HK, suspect hibernating myocardium.  . Myocardial infarction ([place]) 1977; 1982  . Osteoarthrosis, unspecified whether generalized or localized, unspecified site   . Presence of permanent cardiac pacemaker   . PVD (peripheral vascular disease) ([place])   . Type II diabetes mellitus ([place])   . Unspecified hearing loss    Past Surgical History:  Procedure Laterality Date  . ABDOMINAL HYSTERECTOMY    . APPENDECTOMY    . BACK SURGERY    . CARDIAC CATHETERIZATION N/A 05/19/2016   Procedure: Left Heart Cath and Cors/Grafts Angiography;  Surgeon: Belva Crome, MD;  Location: [facility];  Service: Cardiovascular;  Laterality: N/A;  . CARDIAC CATHETERIZATION N/A 05/19/2016   Procedure: Coronary Stent Intervention;  Surgeon: Belva Crome, MD;  Location: [facility];  Service: Cardiovascular;  Laterality: N/A;  . CATARACT EXTRACTION W/ INTRAOCULAR LENS  IMPLANT, BILATERAL Bilateral   . CHOLECYSTECTOMY OPEN    . COLONOSCOPY    . CORONARY ANGIOPLASTY WITH STENT PLACEMENT  05/19/2016   "2 stents?"  . CORONARY ARTERY BYPASS GRAFT  1982; 2003    x4(1982),02-2002 CABG X2  . ESOPHAGOGASTRODUODENOSCOPY (EGD) WITH ESOPHAGEAL DILATION  "2-3 times"  . INSERT / REPLACE / REMOVE PACEMAKER    . LEFT HEART CATHETERIZATION WITH CORONARY/GRAFT ANGIOGRAM N/A 03/05/2014   Procedure: LEFT HEART CATHETERIZATION WITH Beatrix Fetters;  Surgeon: Sinclair Grooms, MD;  Location: [facility];  Service: Cardiovascular;  Laterality: N/A;  . LUMBAR [place] SURGERY  01-2000  . OOPHORECTOMY Bilateral   . PERMANENT PACEMAKER INSERTION N/A 07/06/2012   MDT Adapta L implanted by Dr Rayann Heman for symptomatic bradycardia  . TONSILLECTOMY  1930s   Family History  Problem Relation Age of Onset  . Heart disease Sister   . Heart attack Sister   . Colon cancer Neg Hx   . Breast cancer Neg Hx   . Celiac disease Neg Hx   . Cirrhosis Neg Hx   . Clotting disorder Neg Hx   . Colitis Neg Hx   . Colon polyps Neg Hx   . Crohn's disease Neg Hx   . Cystic fibrosis Neg Hx   . Diabetes Neg Hx   . Esophageal cancer Neg Hx   . Hemochromatosis Neg Hx   .  Inflammatory bowel disease Neg Hx   . Irritable bowel syndrome Neg Hx   . Kidney disease Neg Hx   . Liver cancer Neg Hx   . Liver disease Neg Hx   . Ovarian cancer Neg Hx   . Pancreatic cancer Neg Hx   . Prostate cancer Neg Hx   . Rectal cancer Neg Hx   . Stomach cancer Neg Hx   . Ulcerative colitis Neg Hx   . Uterine cancer Neg Hx   . Wilson's disease Neg Hx    Social History   Socioeconomic History  . Marital status: Married    Spouse name: [name]  . Number of children: 3  . Years of education: 48  . Highest education level: Not on file  Occupational History  . Occupation: Retired     Fish farm manager: RETIRED  Tobacco Use  . Smoking status: Former Smoker    Packs/day: 0.50    Years: 30.00    Pack years: 15.00    Types: Cigarettes    Quit date: 07/05/1976    Years since quitting: 43.9  . Smokeless tobacco: Never Used  Vaping Use  . Vaping Use: Never used  Substance and Sexual Activity  . Alcohol use:  No    Alcohol/week: 0.0 standard drinks    Comment: "not anymore, I haven't in I don't know years"  . Drug use: No  . Sexual activity: Never  Other Topics Concern  . Not on file  Social History Narrative   Patient is married [name]) and lives at home with her husband.   Patient has three adult children.   Patient is retired.   Patient has a college education.   Patient is right-handed.   Patient does not drink any caffeine.   Social Determinants of Health   Financial Resource Strain: Low Risk   . Difficulty of Paying Living Expenses: Not hard at all  Food Insecurity: No Food Insecurity  . Worried About Charity fundraiser in the Last Year: Never true  . Ran Out of Food in the Last Year: Never true  Transportation Needs: No Transportation Needs  . Lack of Transportation (Medical): No  . Lack of Transportation (Non-Medical): No  Physical Activity: Unknown  . Days of Exercise per Week: Patient refused  . Minutes of Exercise per Session: Not on file  Stress: No Stress Concern Present  . Feeling of Stress : Not at all  Social Connections: Unknown  . Frequency of Communication with Friends and Family: More than three times a week  . Frequency of Social Gatherings with Friends and Family: More than three times a week  . Attends Religious Services: Patient refused  . Active Member of Clubs or Organizations: Patient refused  . Attends Archivist Meetings: Patient refused  . Marital Status: Married    Tobacco Counseling Counseling given: Not Answered   Clinical Intake:  Pre-visit preparation completed: Yes  Pain : 0-10 Pain Score: 5  Pain Type: Chronic pain Pain Location: Abdomen Pain Descriptors / Indicators: Discomfort, Stabbing Pain Onset: More than a month ago Pain Frequency: Intermittent     Nutritional Risks: Nausea/ vomitting/ diarrhea Diabetes: Yes CBG done?: No Did pt. bring in CBG monitor from home?: No  How often do you need to have someone  help you when you read instructions, pamphlets, or other written materials from your doctor or pharmacy?: 1 - Never What is the last grade level you completed in school?: Bachelor's Degree  Diabetic? yes  Interpreter Needed?:  No  Information entered by :: Leshaun Biebel N. Khyra Viscuso, LPN   Activities of Daily Living In your present state of health, do you have any difficulty performing the following activities: 06/04/2020 02/25/2020  Hearing? Y Y  Comment does not wear her hearing aids has hearing aid  Vision? N Y  Comment - wears glasses, need eye exam  Difficulty concentrating or making decisions? N Y  Comment - CVA, some memory loss  Walking or climbing stairs? Tempie Donning  Comment now lives on main level uses walker  Dressing or bathing? N N  Doing errands, shopping? Y Y  Comment - depends on family  Preparing Food and eating ? N Y  Comment - husband and family cooks  Using the Toilet? N N  In the past six months, have you accidently leaked urine? Y Y  Comment - some incontinence  Do you have problems with loss of bowel control? N N  Managing your Medications? N Y  Comment - managed by family  Managing your Finances? Y Y  Comment - managed by family  Housekeeping or managing your Housekeeping? Y Y  Comment - managed by family  Some recent data might be hidden    Patient Care Team: Binnie Rail, MD as PCP - General (Internal Medicine) Sherren Mocha, MD as PCP - Cardiology (Cardiology) Ward Givens, NP as Registered Nurse (Neurology) Irene Shipper, MD as Consulting Physician (Gastroenterology) Valente David, RN as [place] any recent [place] you may have received from other than [facility] providers in the past year (date may be approximate).     Assessment:   This is a routine wellness examination for Janet Steele.  Hearing/Vision screen No exam data present  Dietary issues and exercise activities discussed: Current Exercise  Habits: The patient does not participate in regular exercise at present, Exercise limited by: neurologic condition(s);psychological condition(s)  Goals    .  Patient Stated      Maintain current health status.    .  Patient Stated (pt-stated)      To be able to walk and be more active.  Would like to increase my water intake.      Depression Screen PHQ 2/9 Scores 06/04/2020 02/25/2020 01/02/2019 01/17/2017 12/17/2016 11/18/2015 02/04/2014  PHQ - 2 Score 0 - 1 0 0 1 6  PHQ- 9 Score - - 9 - - - 15  Exception Documentation - Other- indicate reason in comment box - - - - -    Fall Risk Fall Risk  06/04/2020 02/25/2020 02/19/2020 01/02/2019 07/24/2018  Falls in the past year? 1 1 1  0 Yes  Number falls in past yr: 1 1 1  - 2 or more  Injury with Fall? 0 1 1 - -  Risk Factor Category  - - - - -  Risk for fall due to : Impaired balance/gait;Impaired mobility History of fall(s) - - Impaired mobility  Follow up Falls evaluation completed Falls prevention discussed - - -    Any stairs in or around the home? No  If so, are there any without handrails? No  Home free of loose throw rugs in walkways, pet beds, electrical cords, etc? Yes  Adequate lighting in your home to reduce risk of falls? Yes   ASSISTIVE DEVICES UTILIZED TO PREVENT FALLS:  Life alert? Yes  Use of a cane, walker or w/c? Yes  Grab bars in the bathroom? Yes  Shower chair or bench in shower? Yes  Elevated toilet seat  or a handicapped toilet? Yes   TIMED UP AND GO:  Was the test performed? No .  Length of time to ambulate 10 feet: 0 sec.   Gait unsteady with use of assistive device, provider informed and education provided.   Cognitive Function: MMSE - Mini Mental State Exam 01/02/2019 04/05/2017 01/17/2017 01/04/2017 11/17/2016  Not completed: Refused - - - -  Orientation to time - 5 5 3 4   Orientation to Place - 4 5 5 5   Registration - 3 2 3 3   Attention/ Calculation - 1 2 5 5   Recall - 1 2 3 2   Language- name 2 objects - 2 2  2 2   Language- repeat - 0 1 1 0  Language- follow 3 step command - 3 2 3 3   Language- read & follow direction - 1 1 1 1   Language-read & follow direction-comments - - - - -  Write a sentence - 1 1 1 1   Copy design - 1 1 1 1   Total score - 22 24 28 27    Montreal Cognitive Assessment  11/06/2015  Visuospatial/ Executive (0/5) 0  Naming (0/3) 3  Attention: Read list of digits (0/2) 1   6CIT Screen 06/04/2020  What Year? 0 points  What month? 0 points  What time? 3 points  Count back from 20 0 points  Months in reverse 0 points  Repeat phrase 0 points  Total Score 3    Immunizations Immunization History  Administered Date(s) Administered  . Fluad Quad(high Dose 65+) 08/17/2019  . Influenza Whole 12/06/1997, 09/25/2007  . Influenza, High Dose Seasonal PF 09/27/2014, 09/06/2017, 09/29/2018  . Influenza-Unspecified 08/06/2013, 08/13/2015, 10/15/2016  . PFIZER SARS-COV-2 Vaccination 01/10/2020, 02/20/2020  . Pneumococcal Conjugate-13 08/06/2015  . Pneumococcal Polysaccharide-23 12/06/1996, 05/28/2010  . Td 05/06/2004  . Tdap 06/05/2018    TDAP status: Up to date Flu Vaccine status: Up to date Pneumococcal vaccine status: Up to date Covid-19 vaccine status: Completed vaccines  Qualifies for Shingles Vaccine? Yes   Zostavax completed No   Shingrix Completed?: No.    Education has been provided regarding the importance of this vaccine. Patient has been advised to call insurance company to determine out of pocket expense if they have not yet received this vaccine. Advised may also receive vaccine at local pharmacy or Health Dept. Verbalized acceptance and understanding.  Screening Tests Health Maintenance  Topic Date Due  . DEXA SCAN  Never done  . FOOT EXAM  09/30/2019  . INFLUENZA VACCINE  07/06/2020  . OPHTHALMOLOGY EXAM  08/05/2020  . TETANUS/TDAP  06/05/2028  . COVID-19 Vaccine  Completed  . PNA vac Low Risk Adult  Completed    Health Maintenance  Health  Maintenance Due  Topic Date Due  . DEXA SCAN  Never done  . FOOT EXAM  09/30/2019    Colorectal cancer screening: No longer required.  Mammogram status: No longer required.  Bone Density scan: Denied  Lung Cancer Screening: (Low Dose CT Chest recommended if Age 62-80 years, 30 pack-year currently smoking OR have quit w/in 15years.) does not qualify.   Lung Cancer Screening Referral: no  Additional Screening:  Hepatitis C Screening: does not qualify; Completed no  Vision Screening: Recommended annual ophthalmology exams for early detection of glaucoma and other disorders of the eye. Is the patient up to date with their annual eye exam?  Yes  Who is the provider or what is the name of the office in which the patient attends annual eye exams?  [facility] If pt is not established with a provider, would they like to be referred to a provider to establish care? No .   Dental Screening: Recommended annual dental exams for proper oral hygiene  Community Resource Referral / Chronic Care Management: CRR required this visit?  No   CCM required this visit?  No      Plan:     I have personally reviewed and noted the following in the patient's chart:   . Medical and social history . Use of alcohol, tobacco or illicit drugs  . Current medications and supplements . Functional ability and status . Nutritional status . Physical activity . Advanced directives . List of other physicians . Hospitalizations, surgeries, and ER visits in previous 12 months . Vitals . Screenings to include cognitive, depression, and falls . Referrals and appointments  In addition, I have reviewed and discussed with patient certain preventive protocols, quality metrics, and best practice recommendations. A written personalized care plan for preventive services as well as general preventive health recommendations were provided to patient.     Sheral Flow, LPN   03/14/8118    Nurse Notes:  There were no vitals filed for this visit. There is no height or weight on file to calculate BMI.

## 2020-06-04 NOTE — Patient Instructions (Addendum)
Janet Steele , Thank you for taking time to come for your Medicare Wellness Visit. I appreciate your ongoing commitment to your health goals. Please review the following plan we discussed and let me know if I can assist you in the future.   Screening recommendations/referrals: Colonoscopy: no repeat due to age 86: no repeat due to age Bone Density: never done Recommended yearly ophthalmology/optometry visit for glaucoma screening and checkup Recommended yearly dental visit for hygiene and checkup  Vaccinations: Influenza vaccine: 08/17/2019 Pneumococcal vaccine: completed Tdap vaccine: 06/05/2018; due every 10 years Shingles vaccine: never done   Covid-19: completed  Advanced directives: A copy of your health care power of attorney and living will was located in your chart.  Conditions/risks identified: Please continue to do your personal lifestyle choices by: daily care of teeth and gums, regular physical activity (goal should be 5 days a week for 30 minutes), eat a healthy diet, avoid tobacco and drug use, limiting any alcohol intake, taking a low-dose aspirin (if not allergic or have been advised by your provider otherwise) and taking vitamins and minerals as recommended by your provider. Continue doing brain stimulating activities (puzzles, reading, adult coloring books, staying active) to keep memory sharp. Continue to eat heart healthy diet (full of fruits, vegetables, whole grains, lean protein, water--limit salt, fat, and sugar intake) and increase physical activity as tolerated.  Next appointment: Please schedule your next Medicare Wellness Visit with your Nurse Health Advisor in 1 year.   Preventive Care 17 Years and Older, Female Preventive care refers to lifestyle choices and visits with your health care provider that can promote health and wellness. What does preventive care include?  A yearly physical exam. This is also called an annual well check.  Dental exams once or  twice a year.  Routine eye exams. Ask your health care provider how often you should have your eyes checked.  Personal lifestyle choices, including:  Daily care of your teeth and gums.  Regular physical activity.  Eating a healthy diet.  Avoiding tobacco and drug use.  Limiting alcohol use.  Practicing safe sex.  Taking low-dose aspirin every day.  Taking vitamin and mineral supplements as recommended by your health care provider. What happens during an annual well check? The services and screenings done by your health care provider during your annual well check will depend on your age, overall health, lifestyle risk factors, and family history of disease. Counseling  Your health care provider may ask you questions about your:  Alcohol use.  Tobacco use.  Drug use.  Emotional well-being.  Home and relationship well-being.  Sexual activity.  Eating habits.  History of falls.  Memory and ability to understand (cognition).  Work and work Statistician.  Reproductive health. Screening  You may have the following tests or measurements:  Height, weight, and BMI.  Blood pressure.  Lipid and cholesterol levels. These may be checked every 5 years, or more frequently if you are over 67 years old.  Skin check.  Lung cancer screening. You may have this screening every year starting at age 59 if you have a 30-pack-year history of smoking and currently smoke or have quit within the past 15 years.  Fecal occult blood test (FOBT) of the stool. You may have this test every year starting at age 63.  Flexible sigmoidoscopy or colonoscopy. You may have a sigmoidoscopy every 5 years or a colonoscopy every 10 years starting at age 72.  Hepatitis C blood test.  Hepatitis B blood test.  Sexually transmitted disease (STD) testing.  Diabetes screening. This is done by checking your blood sugar (glucose) after you have not eaten for a while (fasting). You may have this done  every 1-3 years.  Bone density scan. This is done to screen for osteoporosis. You may have this done starting at age 86.  Mammogram. This may be done every 1-2 years. Talk to your health care provider about how often you should have regular mammograms. Talk with your health care provider about your test results, treatment options, and if necessary, the need for more tests. Vaccines  Your health care provider may recommend certain vaccines, such as:  Influenza vaccine. This is recommended every year.  Tetanus, diphtheria, and acellular pertussis (Tdap, Td) vaccine. You may need a Td booster every 10 years.  Zoster vaccine. You may need this after age 22.  Pneumococcal 13-valent conjugate (PCV13) vaccine. One dose is recommended after age 44.  Pneumococcal polysaccharide (PPSV23) vaccine. One dose is recommended after age 44. Talk to your health care provider about which screenings and vaccines you need and how often you need them. This information is not intended to replace advice given to you by your health care provider. Make sure you discuss any questions you have with your health care provider. Document Released: 12/19/2015 Document Revised: 08/11/2016 Document Reviewed: 09/23/2015 Elsevier Interactive Patient Education  2017 [employer] Prevention in the Home Falls can cause injuries. They can happen to people of all ages. There are many things you can do to make your home safe and to help prevent falls. What can I do on the outside of my home?  Regularly fix the edges of walkways and driveways and fix any cracks.  Remove anything that might make you trip as you walk through a door, such as a raised step or threshold.  Trim any bushes or trees on the path to your home.  Use bright outdoor lighting.  Clear any walking paths of anything that might make someone trip, such as rocks or tools.  Regularly check to see if handrails are loose or broken. Make sure that both  sides of any steps have handrails.  Any raised decks and porches should have guardrails on the edges.  Have any leaves, snow, or ice cleared regularly.  Use sand or salt on walking paths during winter.  Clean up any spills in your garage right away. This includes oil or grease spills. What can I do in the bathroom?  Use night lights.  Install grab bars by the toilet and in the tub and shower. Do not use towel bars as grab bars.  Use non-skid mats or decals in the tub or shower.  If you need to sit down in the shower, use a plastic, non-slip stool.  Keep the floor dry. Clean up any water that spills on the floor as soon as it happens.  Remove soap buildup in the tub or shower regularly.  Attach bath mats securely with double-sided non-slip rug tape.  Do not have throw rugs and other things on the floor that can make you trip. What can I do in the bedroom?  Use night lights.  Make sure that you have a light by your bed that is easy to reach.  Do not use any sheets or blankets that are too big for your bed. They should not hang down onto the floor.  Have a firm chair that has side arms. You can use this for support while you get  dressed.  Do not have throw rugs and other things on the floor that can make you trip. What can I do in the kitchen?  Clean up any spills right away.  Avoid walking on wet floors.  Keep items that you use a lot in easy-to-reach places.  If you need to reach something above you, use a strong step stool that has a grab bar.  Keep electrical cords out of the way.  Do not use floor polish or wax that makes floors slippery. If you must use wax, use non-skid floor wax.  Do not have throw rugs and other things on the floor that can make you trip. What can I do with my stairs?  Do not leave any items on the stairs.  Make sure that there are handrails on both sides of the stairs and use them. Fix handrails that are broken or loose. Make sure that  handrails are as long as the stairways.  Check any carpeting to make sure that it is firmly attached to the stairs. Fix any carpet that is loose or worn.  Avoid having throw rugs at the top or bottom of the stairs. If you do have throw rugs, attach them to the floor with carpet tape.  Make sure that you have a light switch at the top of the stairs and the bottom of the stairs. If you do not have them, ask someone to add them for you. What else can I do to help prevent falls?  Wear shoes that:  Do not have high heels.  Have rubber bottoms.  Are comfortable and fit you well.  Are closed at the toe. Do not wear sandals.  If you use a stepladder:  Make sure that it is fully opened. Do not climb a closed stepladder.  Make sure that both sides of the stepladder are locked into place.  Ask someone to hold it for you, if possible.  Clearly mark and make sure that you can see:  Any grab bars or handrails.  First and last steps.  Where the edge of each step is.  Use tools that help you move around (mobility aids) if they are needed. These include:  Canes.  Walkers.  Scooters.  Crutches.  Turn on the lights when you go into a dark area. Replace any light bulbs as soon as they burn out.  Set up your furniture so you have a clear path. Avoid moving your furniture around.  If any of your floors are uneven, fix them.  If there are any pets around you, be aware of where they are.  Review your medicines with your doctor. Some medicines can make you feel dizzy. This can increase your chance of falling. Ask your doctor what other things that you can do to help prevent falls. This information is not intended to replace advice given to you by your health care provider. Make sure you discuss any questions you have with your health care provider. Document Released: 09/18/2009 Document Revised: 04/29/2016 Document Reviewed: 12/27/2014 Elsevier Interactive Patient Education  2017  [employer].

## 2020-06-05 DIAGNOSIS — M1990 Unspecified osteoarthritis, unspecified site: Secondary | ICD-10-CM | POA: Diagnosis not present

## 2020-06-05 DIAGNOSIS — E1142 Type 2 diabetes mellitus with diabetic polyneuropathy: Secondary | ICD-10-CM | POA: Diagnosis not present

## 2020-06-05 DIAGNOSIS — I25810 Atherosclerosis of coronary artery bypass graft(s) without angina pectoris: Secondary | ICD-10-CM | POA: Diagnosis not present

## 2020-06-05 DIAGNOSIS — I69351 Hemiplegia and hemiparesis following cerebral infarction affecting right dominant side: Secondary | ICD-10-CM | POA: Diagnosis not present

## 2020-06-05 DIAGNOSIS — M549 Dorsalgia, unspecified: Secondary | ICD-10-CM | POA: Diagnosis not present

## 2020-06-05 DIAGNOSIS — G8929 Other chronic pain: Secondary | ICD-10-CM | POA: Diagnosis not present

## 2020-06-06 ENCOUNTER — Telehealth: Payer: Self-pay | Admitting: Internal Medicine

## 2020-06-06 DIAGNOSIS — I25810 Atherosclerosis of coronary artery bypass graft(s) without angina pectoris: Secondary | ICD-10-CM | POA: Diagnosis not present

## 2020-06-06 DIAGNOSIS — I69351 Hemiplegia and hemiparesis following cerebral infarction affecting right dominant side: Secondary | ICD-10-CM | POA: Diagnosis not present

## 2020-06-06 DIAGNOSIS — M1990 Unspecified osteoarthritis, unspecified site: Secondary | ICD-10-CM | POA: Diagnosis not present

## 2020-06-06 DIAGNOSIS — G8929 Other chronic pain: Secondary | ICD-10-CM | POA: Diagnosis not present

## 2020-06-06 DIAGNOSIS — M549 Dorsalgia, unspecified: Secondary | ICD-10-CM | POA: Diagnosis not present

## 2020-06-06 DIAGNOSIS — E1142 Type 2 diabetes mellitus with diabetic polyneuropathy: Secondary | ICD-10-CM | POA: Diagnosis not present

## 2020-06-06 NOTE — Telephone Encounter (Signed)
Pt informed of below.  

## 2020-06-06 NOTE — Telephone Encounter (Signed)
The Zofran is for nausea only-not pain.  If she is having pain I would only recommend Tylenol at this point because she is on a blood thinner.

## 2020-06-06 NOTE — Telephone Encounter (Signed)
Janet Steele called requesting another medication. Patient states the ondansetron (ZOFRAN-ODT) 4 MG disintegrating tablet is not working for her stomach pain.  Sending directly to you as CMA is gone for the day.

## 2020-06-10 DIAGNOSIS — I119 Hypertensive heart disease without heart failure: Secondary | ICD-10-CM | POA: Diagnosis not present

## 2020-06-10 DIAGNOSIS — Z951 Presence of aortocoronary bypass graft: Secondary | ICD-10-CM

## 2020-06-10 DIAGNOSIS — Z950 Presence of cardiac pacemaker: Secondary | ICD-10-CM

## 2020-06-10 DIAGNOSIS — Z87891 Personal history of nicotine dependence: Secondary | ICD-10-CM

## 2020-06-10 DIAGNOSIS — G8929 Other chronic pain: Secondary | ICD-10-CM | POA: Diagnosis not present

## 2020-06-10 DIAGNOSIS — M549 Dorsalgia, unspecified: Secondary | ICD-10-CM | POA: Diagnosis not present

## 2020-06-10 DIAGNOSIS — F329 Major depressive disorder, single episode, unspecified: Secondary | ICD-10-CM | POA: Diagnosis not present

## 2020-06-10 DIAGNOSIS — E1142 Type 2 diabetes mellitus with diabetic polyneuropathy: Secondary | ICD-10-CM | POA: Diagnosis not present

## 2020-06-10 DIAGNOSIS — M1990 Unspecified osteoarthritis, unspecified site: Secondary | ICD-10-CM | POA: Diagnosis not present

## 2020-06-10 DIAGNOSIS — Z7984 Long term (current) use of oral hypoglycemic drugs: Secondary | ICD-10-CM

## 2020-06-10 DIAGNOSIS — E1151 Type 2 diabetes mellitus with diabetic peripheral angiopathy without gangrene: Secondary | ICD-10-CM | POA: Diagnosis not present

## 2020-06-10 DIAGNOSIS — F419 Anxiety disorder, unspecified: Secondary | ICD-10-CM

## 2020-06-10 DIAGNOSIS — G3184 Mild cognitive impairment, so stated: Secondary | ICD-10-CM | POA: Diagnosis not present

## 2020-06-10 DIAGNOSIS — I495 Sick sinus syndrome: Secondary | ICD-10-CM

## 2020-06-10 DIAGNOSIS — I255 Ischemic cardiomyopathy: Secondary | ICD-10-CM | POA: Diagnosis not present

## 2020-06-10 DIAGNOSIS — I25810 Atherosclerosis of coronary artery bypass graft(s) without angina pectoris: Secondary | ICD-10-CM | POA: Diagnosis not present

## 2020-06-10 DIAGNOSIS — I69351 Hemiplegia and hemiparesis following cerebral infarction affecting right dominant side: Secondary | ICD-10-CM | POA: Diagnosis not present

## 2020-06-10 DIAGNOSIS — E039 Hypothyroidism, unspecified: Secondary | ICD-10-CM | POA: Diagnosis not present

## 2020-06-10 DIAGNOSIS — Z7902 Long term (current) use of antithrombotics/antiplatelets: Secondary | ICD-10-CM

## 2020-06-12 ENCOUNTER — Other Ambulatory Visit: Payer: Self-pay

## 2020-06-12 ENCOUNTER — Other Ambulatory Visit: Payer: Medicare Other

## 2020-06-12 DIAGNOSIS — E039 Hypothyroidism, unspecified: Secondary | ICD-10-CM | POA: Diagnosis not present

## 2020-06-12 LAB — TSH: TSH: 18.38 mIU/L — ABNORMAL HIGH (ref 0.40–4.50)

## 2020-06-16 ENCOUNTER — Other Ambulatory Visit: Payer: Self-pay | Admitting: *Deleted

## 2020-06-16 NOTE — Patient Outreach (Signed)
[facility]) Care Management  06/16/2020  Janet Steele 1929/04/10 [id number]   Outreach attempt #4 unsuccessful to son.  No response from member after multiple unsuccessful outreach attempts and letter sent.  Will close case at this time due to inability to maintain contact.  Will notify member and primary MD of case closure.  Valente David, [place], MSN [place] [phone]

## 2020-06-17 ENCOUNTER — Other Ambulatory Visit: Payer: Self-pay | Admitting: Internal Medicine

## 2020-06-17 DIAGNOSIS — I69351 Hemiplegia and hemiparesis following cerebral infarction affecting right dominant side: Secondary | ICD-10-CM | POA: Diagnosis not present

## 2020-06-17 DIAGNOSIS — E1142 Type 2 diabetes mellitus with diabetic polyneuropathy: Secondary | ICD-10-CM | POA: Diagnosis not present

## 2020-06-17 DIAGNOSIS — M1990 Unspecified osteoarthritis, unspecified site: Secondary | ICD-10-CM | POA: Diagnosis not present

## 2020-06-17 DIAGNOSIS — E039 Hypothyroidism, unspecified: Secondary | ICD-10-CM

## 2020-06-17 DIAGNOSIS — I25810 Atherosclerosis of coronary artery bypass graft(s) without angina pectoris: Secondary | ICD-10-CM | POA: Diagnosis not present

## 2020-06-17 DIAGNOSIS — G8929 Other chronic pain: Secondary | ICD-10-CM | POA: Diagnosis not present

## 2020-06-17 DIAGNOSIS — M549 Dorsalgia, unspecified: Secondary | ICD-10-CM | POA: Diagnosis not present

## 2020-06-17 MED ORDER — LEVOTHYROXINE SODIUM 100 MCG PO TABS: 100.0000 ug | ORAL_TABLET | Freq: Every day | ORAL | 1 refills | Status: DC

## 2020-06-18 DIAGNOSIS — Z7902 Long term (current) use of antithrombotics/antiplatelets: Secondary | ICD-10-CM | POA: Diagnosis not present

## 2020-06-18 DIAGNOSIS — Z7984 Long term (current) use of oral hypoglycemic drugs: Secondary | ICD-10-CM | POA: Diagnosis not present

## 2020-06-18 DIAGNOSIS — I25810 Atherosclerosis of coronary artery bypass graft(s) without angina pectoris: Secondary | ICD-10-CM | POA: Diagnosis not present

## 2020-06-18 DIAGNOSIS — G8929 Other chronic pain: Secondary | ICD-10-CM | POA: Diagnosis not present

## 2020-06-18 DIAGNOSIS — G3184 Mild cognitive impairment, so stated: Secondary | ICD-10-CM | POA: Diagnosis not present

## 2020-06-18 DIAGNOSIS — E1142 Type 2 diabetes mellitus with diabetic polyneuropathy: Secondary | ICD-10-CM | POA: Diagnosis not present

## 2020-06-18 DIAGNOSIS — E1151 Type 2 diabetes mellitus with diabetic peripheral angiopathy without gangrene: Secondary | ICD-10-CM | POA: Diagnosis not present

## 2020-06-18 DIAGNOSIS — I69351 Hemiplegia and hemiparesis following cerebral infarction affecting right dominant side: Secondary | ICD-10-CM | POA: Diagnosis not present

## 2020-06-18 DIAGNOSIS — I495 Sick sinus syndrome: Secondary | ICD-10-CM | POA: Diagnosis not present

## 2020-06-18 DIAGNOSIS — I119 Hypertensive heart disease without heart failure: Secondary | ICD-10-CM | POA: Diagnosis not present

## 2020-06-18 DIAGNOSIS — M1990 Unspecified osteoarthritis, unspecified site: Secondary | ICD-10-CM | POA: Diagnosis not present

## 2020-06-18 DIAGNOSIS — F419 Anxiety disorder, unspecified: Secondary | ICD-10-CM | POA: Diagnosis not present

## 2020-06-18 DIAGNOSIS — I255 Ischemic cardiomyopathy: Secondary | ICD-10-CM | POA: Diagnosis not present

## 2020-06-18 DIAGNOSIS — F329 Major depressive disorder, single episode, unspecified: Secondary | ICD-10-CM | POA: Diagnosis not present

## 2020-06-18 DIAGNOSIS — Z950 Presence of cardiac pacemaker: Secondary | ICD-10-CM | POA: Diagnosis not present

## 2020-06-18 DIAGNOSIS — E039 Hypothyroidism, unspecified: Secondary | ICD-10-CM | POA: Diagnosis not present

## 2020-06-18 DIAGNOSIS — Z951 Presence of aortocoronary bypass graft: Secondary | ICD-10-CM | POA: Diagnosis not present

## 2020-06-18 DIAGNOSIS — Z87891 Personal history of nicotine dependence: Secondary | ICD-10-CM | POA: Diagnosis not present

## 2020-06-18 DIAGNOSIS — M549 Dorsalgia, unspecified: Secondary | ICD-10-CM | POA: Diagnosis not present

## 2020-06-23 DIAGNOSIS — E1142 Type 2 diabetes mellitus with diabetic polyneuropathy: Secondary | ICD-10-CM | POA: Diagnosis not present

## 2020-06-23 DIAGNOSIS — G8929 Other chronic pain: Secondary | ICD-10-CM | POA: Diagnosis not present

## 2020-06-23 DIAGNOSIS — I25810 Atherosclerosis of coronary artery bypass graft(s) without angina pectoris: Secondary | ICD-10-CM | POA: Diagnosis not present

## 2020-06-23 DIAGNOSIS — M1990 Unspecified osteoarthritis, unspecified site: Secondary | ICD-10-CM | POA: Diagnosis not present

## 2020-06-23 DIAGNOSIS — M549 Dorsalgia, unspecified: Secondary | ICD-10-CM | POA: Diagnosis not present

## 2020-06-23 DIAGNOSIS — I69351 Hemiplegia and hemiparesis following cerebral infarction affecting right dominant side: Secondary | ICD-10-CM | POA: Diagnosis not present

## 2020-06-25 DIAGNOSIS — M1990 Unspecified osteoarthritis, unspecified site: Secondary | ICD-10-CM | POA: Diagnosis not present

## 2020-06-25 DIAGNOSIS — I25810 Atherosclerosis of coronary artery bypass graft(s) without angina pectoris: Secondary | ICD-10-CM | POA: Diagnosis not present

## 2020-06-25 DIAGNOSIS — G8929 Other chronic pain: Secondary | ICD-10-CM | POA: Diagnosis not present

## 2020-06-25 DIAGNOSIS — M549 Dorsalgia, unspecified: Secondary | ICD-10-CM | POA: Diagnosis not present

## 2020-06-25 DIAGNOSIS — I69351 Hemiplegia and hemiparesis following cerebral infarction affecting right dominant side: Secondary | ICD-10-CM | POA: Diagnosis not present

## 2020-06-25 DIAGNOSIS — E1142 Type 2 diabetes mellitus with diabetic polyneuropathy: Secondary | ICD-10-CM | POA: Diagnosis not present

## 2020-07-04 ENCOUNTER — Telehealth: Payer: Self-pay

## 2020-07-04 NOTE — Telephone Encounter (Signed)
New message    Need verbal order for remove visit from this week to End of episode this will be recertification to continue therapy.

## 2020-07-04 NOTE — Telephone Encounter (Signed)
ok 

## 2020-07-08 ENCOUNTER — Other Ambulatory Visit: Payer: Self-pay

## 2020-07-08 ENCOUNTER — Ambulatory Visit (AMBULATORY_SURGERY_CENTER): Payer: Self-pay | Admitting: *Deleted

## 2020-07-08 VITALS — Ht 60.0 in | Wt 100.0 lb

## 2020-07-08 DIAGNOSIS — K219 Gastro-esophageal reflux disease without esophagitis: Secondary | ICD-10-CM

## 2020-07-08 DIAGNOSIS — R1310 Dysphagia, unspecified: Secondary | ICD-10-CM

## 2020-07-08 NOTE — Progress Notes (Signed)
Completed covid vaccine 02-20-20  Pt is able to walk "a little bit"- husband is here and she is using her walker.  Told to hold Plavix x 1 week and start ASA 81 mg po daily until procedure  Pt is aware that care partner will wait in the car during procedure; if they feel like they will be too hot or cold to wait in the car; they may wait in the 4 th floor lobby. Patient is aware to bring only one care partner. We want them to wear a mask (we do not have any that we can provide them), practice social distancing, and we will check their temperatures when they get here.  I did remind the patient that their care partner needs to stay in the parking lot the entire time and have a cell phone available, we will call them when the pt is ready for discharge. Patient will wear mask into building.   No egg or soy allergy  No home oxygen use   No medications for weight loss taken   No trouble with anesthesia, difficulty with intubation or hx/fam hx of malignant hyperthermia per pt

## 2020-07-10 ENCOUNTER — Telehealth: Payer: Self-pay

## 2020-07-10 DIAGNOSIS — E1142 Type 2 diabetes mellitus with diabetic polyneuropathy: Secondary | ICD-10-CM | POA: Diagnosis not present

## 2020-07-10 DIAGNOSIS — M1990 Unspecified osteoarthritis, unspecified site: Secondary | ICD-10-CM | POA: Diagnosis not present

## 2020-07-10 DIAGNOSIS — G8929 Other chronic pain: Secondary | ICD-10-CM | POA: Diagnosis not present

## 2020-07-10 DIAGNOSIS — I25810 Atherosclerosis of coronary artery bypass graft(s) without angina pectoris: Secondary | ICD-10-CM | POA: Diagnosis not present

## 2020-07-10 DIAGNOSIS — M549 Dorsalgia, unspecified: Secondary | ICD-10-CM | POA: Diagnosis not present

## 2020-07-10 DIAGNOSIS — I69351 Hemiplegia and hemiparesis following cerebral infarction affecting right dominant side: Secondary | ICD-10-CM | POA: Diagnosis not present

## 2020-07-10 NOTE — Telephone Encounter (Signed)
New message    Need verbal order for re-certification for next week's visit.

## 2020-07-10 NOTE — Telephone Encounter (Signed)
Notified Liji w/MD response..../lmb 

## 2020-07-10 NOTE — Telephone Encounter (Signed)
ok 

## 2020-07-15 DIAGNOSIS — G8929 Other chronic pain: Secondary | ICD-10-CM | POA: Diagnosis not present

## 2020-07-15 DIAGNOSIS — I25810 Atherosclerosis of coronary artery bypass graft(s) without angina pectoris: Secondary | ICD-10-CM | POA: Diagnosis not present

## 2020-07-15 DIAGNOSIS — M549 Dorsalgia, unspecified: Secondary | ICD-10-CM | POA: Diagnosis not present

## 2020-07-15 DIAGNOSIS — M1990 Unspecified osteoarthritis, unspecified site: Secondary | ICD-10-CM | POA: Diagnosis not present

## 2020-07-15 DIAGNOSIS — I69351 Hemiplegia and hemiparesis following cerebral infarction affecting right dominant side: Secondary | ICD-10-CM | POA: Diagnosis not present

## 2020-07-15 DIAGNOSIS — E1142 Type 2 diabetes mellitus with diabetic polyneuropathy: Secondary | ICD-10-CM | POA: Diagnosis not present

## 2020-07-16 ENCOUNTER — Other Ambulatory Visit: Payer: Self-pay | Admitting: Internal Medicine

## 2020-07-16 DIAGNOSIS — I200 Unstable angina: Secondary | ICD-10-CM

## 2020-07-16 DIAGNOSIS — I25700 Atherosclerosis of coronary artery bypass graft(s), unspecified, with unstable angina pectoris: Secondary | ICD-10-CM

## 2020-07-18 DIAGNOSIS — I119 Hypertensive heart disease without heart failure: Secondary | ICD-10-CM | POA: Diagnosis not present

## 2020-07-18 DIAGNOSIS — M549 Dorsalgia, unspecified: Secondary | ICD-10-CM | POA: Diagnosis not present

## 2020-07-18 DIAGNOSIS — Z7902 Long term (current) use of antithrombotics/antiplatelets: Secondary | ICD-10-CM | POA: Diagnosis not present

## 2020-07-18 DIAGNOSIS — I69351 Hemiplegia and hemiparesis following cerebral infarction affecting right dominant side: Secondary | ICD-10-CM | POA: Diagnosis not present

## 2020-07-18 DIAGNOSIS — Z7984 Long term (current) use of oral hypoglycemic drugs: Secondary | ICD-10-CM | POA: Diagnosis not present

## 2020-07-18 DIAGNOSIS — F419 Anxiety disorder, unspecified: Secondary | ICD-10-CM | POA: Diagnosis not present

## 2020-07-18 DIAGNOSIS — Z87891 Personal history of nicotine dependence: Secondary | ICD-10-CM | POA: Diagnosis not present

## 2020-07-18 DIAGNOSIS — M1990 Unspecified osteoarthritis, unspecified site: Secondary | ICD-10-CM | POA: Diagnosis not present

## 2020-07-18 DIAGNOSIS — E1142 Type 2 diabetes mellitus with diabetic polyneuropathy: Secondary | ICD-10-CM | POA: Diagnosis not present

## 2020-07-18 DIAGNOSIS — G3184 Mild cognitive impairment, so stated: Secondary | ICD-10-CM | POA: Diagnosis not present

## 2020-07-18 DIAGNOSIS — E1151 Type 2 diabetes mellitus with diabetic peripheral angiopathy without gangrene: Secondary | ICD-10-CM | POA: Diagnosis not present

## 2020-07-18 DIAGNOSIS — Z951 Presence of aortocoronary bypass graft: Secondary | ICD-10-CM | POA: Diagnosis not present

## 2020-07-18 DIAGNOSIS — I255 Ischemic cardiomyopathy: Secondary | ICD-10-CM | POA: Diagnosis not present

## 2020-07-18 DIAGNOSIS — I495 Sick sinus syndrome: Secondary | ICD-10-CM | POA: Diagnosis not present

## 2020-07-18 DIAGNOSIS — E039 Hypothyroidism, unspecified: Secondary | ICD-10-CM | POA: Diagnosis not present

## 2020-07-18 DIAGNOSIS — G8929 Other chronic pain: Secondary | ICD-10-CM | POA: Diagnosis not present

## 2020-07-18 DIAGNOSIS — F329 Major depressive disorder, single episode, unspecified: Secondary | ICD-10-CM | POA: Diagnosis not present

## 2020-07-18 DIAGNOSIS — I25810 Atherosclerosis of coronary artery bypass graft(s) without angina pectoris: Secondary | ICD-10-CM | POA: Diagnosis not present

## 2020-07-18 DIAGNOSIS — Z950 Presence of cardiac pacemaker: Secondary | ICD-10-CM | POA: Diagnosis not present

## 2020-07-23 ENCOUNTER — Other Ambulatory Visit: Payer: Self-pay

## 2020-07-23 ENCOUNTER — Ambulatory Visit (AMBULATORY_SURGERY_CENTER): Payer: Medicare Other | Admitting: Internal Medicine

## 2020-07-23 ENCOUNTER — Encounter: Payer: Self-pay | Admitting: Internal Medicine

## 2020-07-23 VITALS — BP 121/52 | HR 62 | Temp 97.5°F | Resp 12 | Ht 60.0 in | Wt 100.0 lb

## 2020-07-23 DIAGNOSIS — K222 Esophageal obstruction: Secondary | ICD-10-CM

## 2020-07-23 DIAGNOSIS — K449 Diaphragmatic hernia without obstruction or gangrene: Secondary | ICD-10-CM | POA: Diagnosis not present

## 2020-07-23 DIAGNOSIS — R1310 Dysphagia, unspecified: Secondary | ICD-10-CM | POA: Diagnosis not present

## 2020-07-23 DIAGNOSIS — K219 Gastro-esophageal reflux disease without esophagitis: Secondary | ICD-10-CM

## 2020-07-23 MED ORDER — SODIUM CHLORIDE 0.9 % IV SOLN: 500.0000 mL | Freq: Once | INTRAVENOUS | Status: DC

## 2020-07-23 NOTE — Op Note (Signed)
[place] Patient Name: Janet Steele Procedure Date: 07/23/2020 2:11 PM MRN: [id number] Endoscopist: Docia Chuck. Henrene Steele , MD Age: 86 Referring MD:  Date of Birth: 08/01/1929 Gender: Female Account #: [id number] Procedure:                Upper GI endoscopy with balloon dilation of the                            esophagus 18-20 mm Indications:              Therapeutic procedure, Dysphagia Medicines:                Monitored Anesthesia Care Procedure:                Pre-Anesthesia Assessment:                           - Prior to the procedure, a History and Physical                            was performed, and patient medications and                            allergies were reviewed. The patient's tolerance of                            previous anesthesia was also reviewed. The risks                            and benefits of the procedure and the sedation                            options and risks were discussed with the patient.                            All questions were answered, and informed consent                            was obtained. Prior Anticoagulants: The patient has                            taken Plavix (clopidogrel), last dose was 7 days                            prior to procedure. ASA Grade Assessment: III - A                            patient with severe systemic disease. After                            reviewing the risks and benefits, the patient was                            deemed in satisfactory condition to undergo the  procedure.                           After obtaining informed consent, the endoscope was                            passed under direct vision. Throughout the                            procedure, the patient's blood pressure, pulse, and                            oxygen saturations were monitored continuously. The                            Endoscope was introduced through the mouth, and                             advanced to the second part of duodenum. The upper                            GI endoscopy was accomplished without difficulty.                            The patient tolerated the procedure well. Scope In: Scope Out: Findings:                 One benign-appearing, intrinsic moderate stenosis                            was found 30 cm from the incisors. This stenosis                            measured 1.5 cm (inner diameter). The stenosis was                            traversed. A TTS dilator was passed through the                            scope. Dilation with an 18-19-20 mm balloon dilator                            was performed to 20 mm. There was disruption of the                            stricture.                           The stomach revealed a moderate hiatal hernia but                            was otherwise normal.                           The examined duodenum was normal.  The cardia and gastric fundus were normal on                            retroflexion. Complications:            No immediate complications. Estimated Blood Loss:     Estimated blood loss: none. Impression:               1. GERD                           2. Esophageal stricture status post dilation?"20 mm                            balloon                           3. Hiatal hernia                           4. Otherwise normal EGD. Recommendation:           1. Patient has a contact number available for                            emergencies. The signs and symptoms of potential                            delayed complications were discussed with the                            patient. Return to normal activities tomorrow.                            Written discharge instructions were provided to the                            patient.                           2. Post dilation diet.                           3. Continue present medications.                            4. Resume Plavix tomorrow                           5. Follow-up as needed for recurrent problems with                            swallowing Janet Quint N. Henrene Pastor, MD 07/23/2020 2:38:54 PM This report has been signed electronically.

## 2020-07-23 NOTE — Patient Instructions (Signed)
Please read handouts provided. Follow Dilation Diet. Continue present medications. Resume Plavix tomorrow. Follow-up as needed for recurrent problems with swallowing.      YOU HAD AN ENDOSCOPIC PROCEDURE TODAY AT [facility]:   Refer to the procedure report that was given to you for any specific questions about what was found during the examination.  If the procedure report does not answer your questions, please call your gastroenterologist to clarify.  If you requested that your care partner not be given the details of your procedure findings, then the procedure report has been included in a sealed envelope for you to review at your convenience later.  YOU SHOULD EXPECT: Some feelings of bloating in the abdomen. Passage of more gas than usual.  Walking can help get rid of the air that was put into your GI tract during the procedure and reduce the bloating. If you had a lower endoscopy (such as a colonoscopy or flexible sigmoidoscopy) you may notice spotting of blood in your stool or on the toilet paper. If you underwent a bowel prep for your procedure, you may not have a normal bowel movement for a few days.  Please Note:  You might notice some irritation and congestion in your nose or some drainage.  This is from the oxygen used during your procedure.  There is no need for concern and it should clear up in a day or so.  SYMPTOMS TO REPORT IMMEDIATELY:     Following upper endoscopy (EGD)  Vomiting of blood or coffee ground material  New chest pain or pain under the shoulder blades  Painful or persistently difficult swallowing  New shortness of breath  Fever of 100F or higher  Black, tarry-looking stools  For urgent or emergent issues, a gastroenterologist can be reached at any hour by calling [phone]. Do not use MyChart messaging for urgent concerns.    DIET:  Drink plenty of fluids but you should avoid alcoholic beverages for 24 hours.  ACTIVITY:  You  should plan to take it easy for the rest of today and you should NOT DRIVE or use heavy machinery until tomorrow (because of the sedation medicines used during the test).    FOLLOW UP: Our staff will call the number listed on your records 48-72 hours following your procedure to check on you and address any questions or concerns that you may have regarding the information given to you following your procedure. If we do not reach you, we will leave a message.  We will attempt to reach you two times.  During this call, we will ask if you have developed any symptoms of COVID 19. If you develop any symptoms (ie: fever, flu-like symptoms, shortness of breath, cough etc.) before then, please call [phone].  If you test positive for Covid 19 in the 2 weeks post procedure, please call and report this information to [place].    If any biopsies were taken you will be contacted by phone or by letter within the next 1-3 weeks.  Please call us at [phone] if you have not heard about the biopsies in 3 weeks.    SIGNATURES/CONFIDENTIALITY: You and/or your care partner have signed paperwork which will be entered into your electronic medical record.  These signatures attest to the fact that that the information above on your After Visit Summary has been reviewed and is understood.  Full responsibility of the confidentiality of this discharge information lies with you and/or your care-partner.

## 2020-07-23 NOTE — Progress Notes (Signed)
Pt's states no medical or surgical changes since previsit or office visit. 

## 2020-07-23 NOTE — Progress Notes (Signed)
pt tolerated well. VSS. awake and to recovery. Report given to RN. Bite block placed and removed without trauma. 

## 2020-07-23 NOTE — Progress Notes (Signed)
Called to room to assist during endoscopic procedure.  Patient ID and intended procedure confirmed with present staff. Received instructions for my participation in the procedure from the performing physician.  

## 2020-07-24 DIAGNOSIS — I25810 Atherosclerosis of coronary artery bypass graft(s) without angina pectoris: Secondary | ICD-10-CM | POA: Diagnosis not present

## 2020-07-24 DIAGNOSIS — E1142 Type 2 diabetes mellitus with diabetic polyneuropathy: Secondary | ICD-10-CM | POA: Diagnosis not present

## 2020-07-24 DIAGNOSIS — M549 Dorsalgia, unspecified: Secondary | ICD-10-CM | POA: Diagnosis not present

## 2020-07-24 DIAGNOSIS — G8929 Other chronic pain: Secondary | ICD-10-CM | POA: Diagnosis not present

## 2020-07-24 DIAGNOSIS — I69351 Hemiplegia and hemiparesis following cerebral infarction affecting right dominant side: Secondary | ICD-10-CM | POA: Diagnosis not present

## 2020-07-24 DIAGNOSIS — M1990 Unspecified osteoarthritis, unspecified site: Secondary | ICD-10-CM | POA: Diagnosis not present

## 2020-07-25 ENCOUNTER — Telehealth: Payer: Self-pay

## 2020-07-25 ENCOUNTER — Telehealth: Payer: Self-pay | Admitting: *Deleted

## 2020-07-25 NOTE — Telephone Encounter (Signed)
First follow up call made. Left message. 

## 2020-07-25 NOTE — Telephone Encounter (Signed)
2nd follow up call made.  NALM 

## 2020-07-28 DIAGNOSIS — F419 Anxiety disorder, unspecified: Secondary | ICD-10-CM

## 2020-07-28 DIAGNOSIS — G8929 Other chronic pain: Secondary | ICD-10-CM | POA: Diagnosis not present

## 2020-07-28 DIAGNOSIS — I119 Hypertensive heart disease without heart failure: Secondary | ICD-10-CM | POA: Diagnosis not present

## 2020-07-28 DIAGNOSIS — I495 Sick sinus syndrome: Secondary | ICD-10-CM

## 2020-07-28 DIAGNOSIS — Z7902 Long term (current) use of antithrombotics/antiplatelets: Secondary | ICD-10-CM

## 2020-07-28 DIAGNOSIS — I69351 Hemiplegia and hemiparesis following cerebral infarction affecting right dominant side: Secondary | ICD-10-CM | POA: Diagnosis not present

## 2020-07-28 DIAGNOSIS — M549 Dorsalgia, unspecified: Secondary | ICD-10-CM | POA: Diagnosis not present

## 2020-07-28 DIAGNOSIS — E1151 Type 2 diabetes mellitus with diabetic peripheral angiopathy without gangrene: Secondary | ICD-10-CM | POA: Diagnosis not present

## 2020-07-28 DIAGNOSIS — I255 Ischemic cardiomyopathy: Secondary | ICD-10-CM | POA: Diagnosis not present

## 2020-07-28 DIAGNOSIS — Z951 Presence of aortocoronary bypass graft: Secondary | ICD-10-CM

## 2020-07-28 DIAGNOSIS — E039 Hypothyroidism, unspecified: Secondary | ICD-10-CM | POA: Diagnosis not present

## 2020-07-28 DIAGNOSIS — Z87891 Personal history of nicotine dependence: Secondary | ICD-10-CM

## 2020-07-28 DIAGNOSIS — E1142 Type 2 diabetes mellitus with diabetic polyneuropathy: Secondary | ICD-10-CM | POA: Diagnosis not present

## 2020-07-28 DIAGNOSIS — F329 Major depressive disorder, single episode, unspecified: Secondary | ICD-10-CM | POA: Diagnosis not present

## 2020-07-28 DIAGNOSIS — G3184 Mild cognitive impairment, so stated: Secondary | ICD-10-CM | POA: Diagnosis not present

## 2020-07-28 DIAGNOSIS — M1990 Unspecified osteoarthritis, unspecified site: Secondary | ICD-10-CM | POA: Diagnosis not present

## 2020-07-28 DIAGNOSIS — Z950 Presence of cardiac pacemaker: Secondary | ICD-10-CM

## 2020-07-28 DIAGNOSIS — Z7984 Long term (current) use of oral hypoglycemic drugs: Secondary | ICD-10-CM

## 2020-07-28 DIAGNOSIS — I25810 Atherosclerosis of coronary artery bypass graft(s) without angina pectoris: Secondary | ICD-10-CM | POA: Diagnosis not present

## 2020-07-29 DIAGNOSIS — E1142 Type 2 diabetes mellitus with diabetic polyneuropathy: Secondary | ICD-10-CM | POA: Diagnosis not present

## 2020-07-29 DIAGNOSIS — I69351 Hemiplegia and hemiparesis following cerebral infarction affecting right dominant side: Secondary | ICD-10-CM | POA: Diagnosis not present

## 2020-07-29 DIAGNOSIS — G8929 Other chronic pain: Secondary | ICD-10-CM | POA: Diagnosis not present

## 2020-07-29 DIAGNOSIS — M1990 Unspecified osteoarthritis, unspecified site: Secondary | ICD-10-CM | POA: Diagnosis not present

## 2020-07-29 DIAGNOSIS — M549 Dorsalgia, unspecified: Secondary | ICD-10-CM | POA: Diagnosis not present

## 2020-07-29 DIAGNOSIS — I25810 Atherosclerosis of coronary artery bypass graft(s) without angina pectoris: Secondary | ICD-10-CM | POA: Diagnosis not present

## 2020-07-31 DIAGNOSIS — M1990 Unspecified osteoarthritis, unspecified site: Secondary | ICD-10-CM | POA: Diagnosis not present

## 2020-07-31 DIAGNOSIS — E1142 Type 2 diabetes mellitus with diabetic polyneuropathy: Secondary | ICD-10-CM | POA: Diagnosis not present

## 2020-07-31 DIAGNOSIS — M549 Dorsalgia, unspecified: Secondary | ICD-10-CM | POA: Diagnosis not present

## 2020-07-31 DIAGNOSIS — I69351 Hemiplegia and hemiparesis following cerebral infarction affecting right dominant side: Secondary | ICD-10-CM | POA: Diagnosis not present

## 2020-07-31 DIAGNOSIS — G8929 Other chronic pain: Secondary | ICD-10-CM | POA: Diagnosis not present

## 2020-07-31 DIAGNOSIS — I25810 Atherosclerosis of coronary artery bypass graft(s) without angina pectoris: Secondary | ICD-10-CM | POA: Diagnosis not present

## 2020-08-05 ENCOUNTER — Other Ambulatory Visit: Payer: Self-pay | Admitting: Internal Medicine

## 2020-08-05 DIAGNOSIS — I200 Unstable angina: Secondary | ICD-10-CM

## 2020-08-05 DIAGNOSIS — I25700 Atherosclerosis of coronary artery bypass graft(s), unspecified, with unstable angina pectoris: Secondary | ICD-10-CM

## 2020-08-05 LAB — HM DIABETES EYE EXAM

## 2020-08-07 DIAGNOSIS — I69351 Hemiplegia and hemiparesis following cerebral infarction affecting right dominant side: Secondary | ICD-10-CM | POA: Diagnosis not present

## 2020-08-07 DIAGNOSIS — E1142 Type 2 diabetes mellitus with diabetic polyneuropathy: Secondary | ICD-10-CM | POA: Diagnosis not present

## 2020-08-07 DIAGNOSIS — G8929 Other chronic pain: Secondary | ICD-10-CM | POA: Diagnosis not present

## 2020-08-07 DIAGNOSIS — M1990 Unspecified osteoarthritis, unspecified site: Secondary | ICD-10-CM | POA: Diagnosis not present

## 2020-08-07 DIAGNOSIS — M549 Dorsalgia, unspecified: Secondary | ICD-10-CM | POA: Diagnosis not present

## 2020-08-07 DIAGNOSIS — I25810 Atherosclerosis of coronary artery bypass graft(s) without angina pectoris: Secondary | ICD-10-CM | POA: Diagnosis not present

## 2020-08-12 DIAGNOSIS — M1990 Unspecified osteoarthritis, unspecified site: Secondary | ICD-10-CM | POA: Diagnosis not present

## 2020-08-12 DIAGNOSIS — E1142 Type 2 diabetes mellitus with diabetic polyneuropathy: Secondary | ICD-10-CM | POA: Diagnosis not present

## 2020-08-12 DIAGNOSIS — I69351 Hemiplegia and hemiparesis following cerebral infarction affecting right dominant side: Secondary | ICD-10-CM | POA: Diagnosis not present

## 2020-08-12 DIAGNOSIS — I25810 Atherosclerosis of coronary artery bypass graft(s) without angina pectoris: Secondary | ICD-10-CM | POA: Diagnosis not present

## 2020-08-12 DIAGNOSIS — G8929 Other chronic pain: Secondary | ICD-10-CM | POA: Diagnosis not present

## 2020-08-12 DIAGNOSIS — M549 Dorsalgia, unspecified: Secondary | ICD-10-CM | POA: Diagnosis not present

## 2020-08-14 ENCOUNTER — Telehealth: Payer: Self-pay | Admitting: Internal Medicine

## 2020-08-14 DIAGNOSIS — I25810 Atherosclerosis of coronary artery bypass graft(s) without angina pectoris: Secondary | ICD-10-CM | POA: Diagnosis not present

## 2020-08-14 DIAGNOSIS — M549 Dorsalgia, unspecified: Secondary | ICD-10-CM | POA: Diagnosis not present

## 2020-08-14 DIAGNOSIS — M1990 Unspecified osteoarthritis, unspecified site: Secondary | ICD-10-CM | POA: Diagnosis not present

## 2020-08-14 DIAGNOSIS — G8929 Other chronic pain: Secondary | ICD-10-CM | POA: Diagnosis not present

## 2020-08-14 DIAGNOSIS — I69351 Hemiplegia and hemiparesis following cerebral infarction affecting right dominant side: Secondary | ICD-10-CM | POA: Diagnosis not present

## 2020-08-14 DIAGNOSIS — E1142 Type 2 diabetes mellitus with diabetic polyneuropathy: Secondary | ICD-10-CM | POA: Diagnosis not present

## 2020-08-14 NOTE — Telephone Encounter (Signed)
Notified Ligi w/ MD response../lmb 

## 2020-08-14 NOTE — Telephone Encounter (Signed)
    Liji from [facility] calling to request verbal order for home speech therapy evaluation and treatment Phone [phone], ok to leave detailed message on xvcml

## 2020-08-14 NOTE — Telephone Encounter (Signed)
Ok for verbal 

## 2020-08-17 DIAGNOSIS — I69351 Hemiplegia and hemiparesis following cerebral infarction affecting right dominant side: Secondary | ICD-10-CM | POA: Diagnosis not present

## 2020-08-17 DIAGNOSIS — E1142 Type 2 diabetes mellitus with diabetic polyneuropathy: Secondary | ICD-10-CM | POA: Diagnosis not present

## 2020-08-17 DIAGNOSIS — F329 Major depressive disorder, single episode, unspecified: Secondary | ICD-10-CM | POA: Diagnosis not present

## 2020-08-17 DIAGNOSIS — Z87891 Personal history of nicotine dependence: Secondary | ICD-10-CM | POA: Diagnosis not present

## 2020-08-17 DIAGNOSIS — M1990 Unspecified osteoarthritis, unspecified site: Secondary | ICD-10-CM | POA: Diagnosis not present

## 2020-08-17 DIAGNOSIS — G3184 Mild cognitive impairment, so stated: Secondary | ICD-10-CM | POA: Diagnosis not present

## 2020-08-17 DIAGNOSIS — I25810 Atherosclerosis of coronary artery bypass graft(s) without angina pectoris: Secondary | ICD-10-CM | POA: Diagnosis not present

## 2020-08-17 DIAGNOSIS — Z7902 Long term (current) use of antithrombotics/antiplatelets: Secondary | ICD-10-CM | POA: Diagnosis not present

## 2020-08-17 DIAGNOSIS — I255 Ischemic cardiomyopathy: Secondary | ICD-10-CM | POA: Diagnosis not present

## 2020-08-17 DIAGNOSIS — M549 Dorsalgia, unspecified: Secondary | ICD-10-CM | POA: Diagnosis not present

## 2020-08-17 DIAGNOSIS — Z950 Presence of cardiac pacemaker: Secondary | ICD-10-CM | POA: Diagnosis not present

## 2020-08-17 DIAGNOSIS — I495 Sick sinus syndrome: Secondary | ICD-10-CM | POA: Diagnosis not present

## 2020-08-17 DIAGNOSIS — Z951 Presence of aortocoronary bypass graft: Secondary | ICD-10-CM | POA: Diagnosis not present

## 2020-08-17 DIAGNOSIS — E039 Hypothyroidism, unspecified: Secondary | ICD-10-CM | POA: Diagnosis not present

## 2020-08-17 DIAGNOSIS — G8929 Other chronic pain: Secondary | ICD-10-CM | POA: Diagnosis not present

## 2020-08-17 DIAGNOSIS — E1151 Type 2 diabetes mellitus with diabetic peripheral angiopathy without gangrene: Secondary | ICD-10-CM | POA: Diagnosis not present

## 2020-08-17 DIAGNOSIS — Z7984 Long term (current) use of oral hypoglycemic drugs: Secondary | ICD-10-CM | POA: Diagnosis not present

## 2020-08-17 DIAGNOSIS — F419 Anxiety disorder, unspecified: Secondary | ICD-10-CM | POA: Diagnosis not present

## 2020-08-17 DIAGNOSIS — I119 Hypertensive heart disease without heart failure: Secondary | ICD-10-CM | POA: Diagnosis not present

## 2020-08-19 DIAGNOSIS — E1142 Type 2 diabetes mellitus with diabetic polyneuropathy: Secondary | ICD-10-CM | POA: Diagnosis not present

## 2020-08-19 DIAGNOSIS — G8929 Other chronic pain: Secondary | ICD-10-CM | POA: Diagnosis not present

## 2020-08-19 DIAGNOSIS — M1990 Unspecified osteoarthritis, unspecified site: Secondary | ICD-10-CM | POA: Diagnosis not present

## 2020-08-19 DIAGNOSIS — I25810 Atherosclerosis of coronary artery bypass graft(s) without angina pectoris: Secondary | ICD-10-CM | POA: Diagnosis not present

## 2020-08-19 DIAGNOSIS — I69351 Hemiplegia and hemiparesis following cerebral infarction affecting right dominant side: Secondary | ICD-10-CM | POA: Diagnosis not present

## 2020-08-19 DIAGNOSIS — M549 Dorsalgia, unspecified: Secondary | ICD-10-CM | POA: Diagnosis not present

## 2020-08-21 ENCOUNTER — Telehealth: Payer: Self-pay | Admitting: Internal Medicine

## 2020-08-21 DIAGNOSIS — M549 Dorsalgia, unspecified: Secondary | ICD-10-CM | POA: Diagnosis not present

## 2020-08-21 DIAGNOSIS — M1990 Unspecified osteoarthritis, unspecified site: Secondary | ICD-10-CM | POA: Diagnosis not present

## 2020-08-21 DIAGNOSIS — E1142 Type 2 diabetes mellitus with diabetic polyneuropathy: Secondary | ICD-10-CM | POA: Diagnosis not present

## 2020-08-21 DIAGNOSIS — G8929 Other chronic pain: Secondary | ICD-10-CM | POA: Diagnosis not present

## 2020-08-21 DIAGNOSIS — I69351 Hemiplegia and hemiparesis following cerebral infarction affecting right dominant side: Secondary | ICD-10-CM | POA: Diagnosis not present

## 2020-08-21 DIAGNOSIS — I25810 Atherosclerosis of coronary artery bypass graft(s) without angina pectoris: Secondary | ICD-10-CM | POA: Diagnosis not present

## 2020-08-21 NOTE — Telephone Encounter (Signed)
Janet Steele with [facility] home health called and said they were moving the patients speech therapy eval to next week per patient request.

## 2020-08-22 NOTE — Telephone Encounter (Signed)
noted 

## 2020-08-25 ENCOUNTER — Telehealth: Payer: Self-pay | Admitting: Internal Medicine

## 2020-08-25 DIAGNOSIS — I69351 Hemiplegia and hemiparesis following cerebral infarction affecting right dominant side: Secondary | ICD-10-CM | POA: Diagnosis not present

## 2020-08-25 DIAGNOSIS — G8929 Other chronic pain: Secondary | ICD-10-CM | POA: Diagnosis not present

## 2020-08-25 DIAGNOSIS — M1990 Unspecified osteoarthritis, unspecified site: Secondary | ICD-10-CM | POA: Diagnosis not present

## 2020-08-25 DIAGNOSIS — I25810 Atherosclerosis of coronary artery bypass graft(s) without angina pectoris: Secondary | ICD-10-CM | POA: Diagnosis not present

## 2020-08-25 DIAGNOSIS — M549 Dorsalgia, unspecified: Secondary | ICD-10-CM | POA: Diagnosis not present

## 2020-08-25 DIAGNOSIS — E1142 Type 2 diabetes mellitus with diabetic polyneuropathy: Secondary | ICD-10-CM | POA: Diagnosis not present

## 2020-08-25 NOTE — Telephone Encounter (Signed)
Notified Janet Steele w/MD response.Marland KitchenJohny Chess

## 2020-08-25 NOTE — Telephone Encounter (Signed)
Ok for ST

## 2020-08-25 NOTE — Telephone Encounter (Signed)
Rosie with [facility] called requesting verbal orders for speech therapy. Norva Riffle [phone] Okay to LVM

## 2020-08-26 DIAGNOSIS — G8929 Other chronic pain: Secondary | ICD-10-CM | POA: Diagnosis not present

## 2020-08-26 DIAGNOSIS — I69351 Hemiplegia and hemiparesis following cerebral infarction affecting right dominant side: Secondary | ICD-10-CM | POA: Diagnosis not present

## 2020-08-26 DIAGNOSIS — I25810 Atherosclerosis of coronary artery bypass graft(s) without angina pectoris: Secondary | ICD-10-CM | POA: Diagnosis not present

## 2020-08-26 DIAGNOSIS — E1142 Type 2 diabetes mellitus with diabetic polyneuropathy: Secondary | ICD-10-CM | POA: Diagnosis not present

## 2020-08-26 DIAGNOSIS — M549 Dorsalgia, unspecified: Secondary | ICD-10-CM | POA: Diagnosis not present

## 2020-08-26 DIAGNOSIS — M1990 Unspecified osteoarthritis, unspecified site: Secondary | ICD-10-CM | POA: Diagnosis not present

## 2020-08-28 DIAGNOSIS — I25810 Atherosclerosis of coronary artery bypass graft(s) without angina pectoris: Secondary | ICD-10-CM | POA: Diagnosis not present

## 2020-08-28 DIAGNOSIS — I69351 Hemiplegia and hemiparesis following cerebral infarction affecting right dominant side: Secondary | ICD-10-CM | POA: Diagnosis not present

## 2020-08-28 DIAGNOSIS — G8929 Other chronic pain: Secondary | ICD-10-CM | POA: Diagnosis not present

## 2020-08-28 DIAGNOSIS — M1990 Unspecified osteoarthritis, unspecified site: Secondary | ICD-10-CM | POA: Diagnosis not present

## 2020-08-28 DIAGNOSIS — M549 Dorsalgia, unspecified: Secondary | ICD-10-CM | POA: Diagnosis not present

## 2020-08-28 DIAGNOSIS — E1142 Type 2 diabetes mellitus with diabetic polyneuropathy: Secondary | ICD-10-CM | POA: Diagnosis not present

## 2020-09-02 DIAGNOSIS — M549 Dorsalgia, unspecified: Secondary | ICD-10-CM | POA: Diagnosis not present

## 2020-09-02 DIAGNOSIS — I69351 Hemiplegia and hemiparesis following cerebral infarction affecting right dominant side: Secondary | ICD-10-CM | POA: Diagnosis not present

## 2020-09-02 DIAGNOSIS — I25810 Atherosclerosis of coronary artery bypass graft(s) without angina pectoris: Secondary | ICD-10-CM | POA: Diagnosis not present

## 2020-09-02 DIAGNOSIS — E1142 Type 2 diabetes mellitus with diabetic polyneuropathy: Secondary | ICD-10-CM | POA: Diagnosis not present

## 2020-09-02 DIAGNOSIS — G8929 Other chronic pain: Secondary | ICD-10-CM | POA: Diagnosis not present

## 2020-09-02 DIAGNOSIS — M1990 Unspecified osteoarthritis, unspecified site: Secondary | ICD-10-CM | POA: Diagnosis not present

## 2020-09-03 DIAGNOSIS — E1142 Type 2 diabetes mellitus with diabetic polyneuropathy: Secondary | ICD-10-CM | POA: Diagnosis not present

## 2020-09-03 DIAGNOSIS — I25810 Atherosclerosis of coronary artery bypass graft(s) without angina pectoris: Secondary | ICD-10-CM | POA: Diagnosis not present

## 2020-09-03 DIAGNOSIS — M549 Dorsalgia, unspecified: Secondary | ICD-10-CM | POA: Diagnosis not present

## 2020-09-03 DIAGNOSIS — G8929 Other chronic pain: Secondary | ICD-10-CM | POA: Diagnosis not present

## 2020-09-03 DIAGNOSIS — M1990 Unspecified osteoarthritis, unspecified site: Secondary | ICD-10-CM | POA: Diagnosis not present

## 2020-09-03 DIAGNOSIS — I69351 Hemiplegia and hemiparesis following cerebral infarction affecting right dominant side: Secondary | ICD-10-CM | POA: Diagnosis not present

## 2020-09-04 DIAGNOSIS — E1142 Type 2 diabetes mellitus with diabetic polyneuropathy: Secondary | ICD-10-CM | POA: Diagnosis not present

## 2020-09-04 DIAGNOSIS — I25810 Atherosclerosis of coronary artery bypass graft(s) without angina pectoris: Secondary | ICD-10-CM | POA: Diagnosis not present

## 2020-09-04 DIAGNOSIS — M549 Dorsalgia, unspecified: Secondary | ICD-10-CM | POA: Diagnosis not present

## 2020-09-04 DIAGNOSIS — M1990 Unspecified osteoarthritis, unspecified site: Secondary | ICD-10-CM | POA: Diagnosis not present

## 2020-09-04 DIAGNOSIS — I69351 Hemiplegia and hemiparesis following cerebral infarction affecting right dominant side: Secondary | ICD-10-CM | POA: Diagnosis not present

## 2020-09-04 DIAGNOSIS — G8929 Other chronic pain: Secondary | ICD-10-CM | POA: Diagnosis not present

## 2020-09-05 ENCOUNTER — Telehealth: Payer: Self-pay | Admitting: Internal Medicine

## 2020-09-05 NOTE — Telephone Encounter (Signed)
ok 

## 2020-09-05 NOTE — Telephone Encounter (Signed)
Called Janet Steele there was no answer LMOM w/MD response.Marland KitchenJohny Chess

## 2020-09-05 NOTE — Telephone Encounter (Signed)
[place]  [phone] okay to leave msg Speech therapy orders Cognitive extension through 1  Extended through 10.11.21 so she can see her twice

## 2020-09-09 DIAGNOSIS — M1990 Unspecified osteoarthritis, unspecified site: Secondary | ICD-10-CM | POA: Diagnosis not present

## 2020-09-09 DIAGNOSIS — I25810 Atherosclerosis of coronary artery bypass graft(s) without angina pectoris: Secondary | ICD-10-CM | POA: Diagnosis not present

## 2020-09-09 DIAGNOSIS — E1142 Type 2 diabetes mellitus with diabetic polyneuropathy: Secondary | ICD-10-CM | POA: Diagnosis not present

## 2020-09-09 DIAGNOSIS — M549 Dorsalgia, unspecified: Secondary | ICD-10-CM | POA: Diagnosis not present

## 2020-09-09 DIAGNOSIS — G8929 Other chronic pain: Secondary | ICD-10-CM | POA: Diagnosis not present

## 2020-09-09 DIAGNOSIS — I69351 Hemiplegia and hemiparesis following cerebral infarction affecting right dominant side: Secondary | ICD-10-CM | POA: Diagnosis not present

## 2020-09-10 DIAGNOSIS — H353213 Exudative age-related macular degeneration, right eye, with inactive scar: Secondary | ICD-10-CM | POA: Diagnosis not present

## 2020-09-10 DIAGNOSIS — H43813 Vitreous degeneration, bilateral: Secondary | ICD-10-CM | POA: Diagnosis not present

## 2020-09-10 DIAGNOSIS — H353122 Nonexudative age-related macular degeneration, left eye, intermediate dry stage: Secondary | ICD-10-CM | POA: Diagnosis not present

## 2020-09-11 DIAGNOSIS — E1142 Type 2 diabetes mellitus with diabetic polyneuropathy: Secondary | ICD-10-CM | POA: Diagnosis not present

## 2020-09-11 DIAGNOSIS — M549 Dorsalgia, unspecified: Secondary | ICD-10-CM | POA: Diagnosis not present

## 2020-09-11 DIAGNOSIS — I69351 Hemiplegia and hemiparesis following cerebral infarction affecting right dominant side: Secondary | ICD-10-CM | POA: Diagnosis not present

## 2020-09-11 DIAGNOSIS — G8929 Other chronic pain: Secondary | ICD-10-CM | POA: Diagnosis not present

## 2020-09-11 DIAGNOSIS — M1990 Unspecified osteoarthritis, unspecified site: Secondary | ICD-10-CM | POA: Diagnosis not present

## 2020-09-11 DIAGNOSIS — I25810 Atherosclerosis of coronary artery bypass graft(s) without angina pectoris: Secondary | ICD-10-CM | POA: Diagnosis not present

## 2020-09-11 NOTE — Telephone Encounter (Signed)
No longer need orders, Discharging as planned before on 10/11

## 2020-09-15 ENCOUNTER — Other Ambulatory Visit: Payer: Self-pay | Admitting: Internal Medicine

## 2020-09-15 DIAGNOSIS — E1142 Type 2 diabetes mellitus with diabetic polyneuropathy: Secondary | ICD-10-CM | POA: Diagnosis not present

## 2020-09-15 DIAGNOSIS — I69351 Hemiplegia and hemiparesis following cerebral infarction affecting right dominant side: Secondary | ICD-10-CM | POA: Diagnosis not present

## 2020-09-15 DIAGNOSIS — G8929 Other chronic pain: Secondary | ICD-10-CM | POA: Diagnosis not present

## 2020-09-15 DIAGNOSIS — M1990 Unspecified osteoarthritis, unspecified site: Secondary | ICD-10-CM | POA: Diagnosis not present

## 2020-09-15 DIAGNOSIS — I25810 Atherosclerosis of coronary artery bypass graft(s) without angina pectoris: Secondary | ICD-10-CM | POA: Diagnosis not present

## 2020-09-15 DIAGNOSIS — M549 Dorsalgia, unspecified: Secondary | ICD-10-CM | POA: Diagnosis not present

## 2020-09-16 ENCOUNTER — Other Ambulatory Visit: Payer: Self-pay | Admitting: Internal Medicine

## 2020-09-19 ENCOUNTER — Telehealth: Payer: Self-pay | Admitting: Internal Medicine

## 2020-09-19 NOTE — Telephone Encounter (Signed)
We could have her drop off a urine sample at [facility] today. Saturday clinic tomorrow would be another option if they don't want to do in person appointment.

## 2020-09-19 NOTE — Telephone Encounter (Signed)
Spoke with patient's husband. He will wait it out over the weekend. He wanted to try OTC medication first to see if it helped. He understands that in order to test for a UTI we would need a urine sample to run and culture.

## 2020-09-19 NOTE — Telephone Encounter (Signed)
   Spouse calling to report patient has frequent urination for 1 day Declined appointment  Seeking advice for OTC medication that may treat urinary tract symptoms.  Please advise

## 2020-09-22 DIAGNOSIS — Z23 Encounter for immunization: Secondary | ICD-10-CM | POA: Diagnosis not present

## 2020-10-06 ENCOUNTER — Other Ambulatory Visit: Payer: Self-pay | Admitting: Internal Medicine

## 2020-10-06 DIAGNOSIS — E1165 Type 2 diabetes mellitus with hyperglycemia: Secondary | ICD-10-CM

## 2020-10-16 NOTE — Progress Notes (Signed)
Subjective:    Patient ID: Janet Steele, female    DOB: 17-Jun-1929, 86 y.o.   MRN: [id number]  HPI The patient is here for an acute visit.  She is here today alone.   Frequent Urination -2 nights ago she went to the bathroom every 30 minutes.  She does have an overactive bladder and has had chronic frequent urination.  She does have dementia and is a poor historian.  She is unable to clearly tell me whether her symptoms are worse or not.  She denies any dysuria, hematuria, change in with the urine looks like, abdominal pain, nausea or fevers.  Medications and allergies reviewed with patient and updated if appropriate.  Patient Active Problem List   Diagnosis Date Noted  . Non-intractable vomiting 05/09/2020  . Poor balance 04/29/2020  . Physical deconditioning 04/29/2020  . Cold extremities 02/19/2020  . AKI (acute kidney injury) ([place]) 01/15/2020  . Colitis 01/15/2020  . Headache 07/21/2019  . Lower abdominal pain 02/05/2019  . Other headache syndrome 08/14/2018  . Depression 08/14/2018  . Weight loss 06/05/2018  . Stroke ([place]) 11/04/2017  . Weakness 11/03/2017  . History of cerebrovascular accident (CVA) with residual deficit 11/03/2017  . Chronic back pain 07/04/2017  . PAD (peripheral artery disease) ([place]) 01/17/2017  . Overactive bladder 12/17/2016  . Anemia 05/20/2016  . Coronary artery disease involving coronary bypass graft of native heart without angina pectoris 05/19/2016  . Numbness and tingling of foot 12/31/2015  . MCI (mild cognitive impairment) with memory loss 11/18/2015  . Facial tic 05/07/2015  . Sick sinus syndrome ([place]) 02/26/2015  . Cervical dystonia 12/04/2014  . Chronic motor tic 09/12/2014  . Pacemaker-Medtronic 07/07/2012  . GERD (gastroesophageal reflux disease) 07/05/2012  . Cough 08/04/2010  . Abnormal involuntary movement 12/17/2009  . Disturbance in sleep behavior 12/11/2009  . CAD, ARTERY BYPASS GRAFT 09/24/2009  . CAROTID BRUIT  09/24/2009  . Diabetes ([place]) 01/30/2009  . Anxiety 02/14/2008  . EROSIVE ESOPHAGITIS 02/14/2008  . CONSTIPATION, CHRONIC 02/14/2008  . DEGENERATIVE JOINT DISEASE 02/14/2008  . HEARING LOSS 01/04/2008  . PVD 01/04/2008  . Hypothyroidism 09/25/2007  . HYPERLIPIDEMIA 09/25/2007  . Essential hypertension 09/07/2007  . Diabetic polyneuropathy ([place]) 03/16/2007  . Stricture and stenosis of esophagus 03/16/2007  . HIATAL HERNIA 02/16/2007  . HEMORRHOIDS 12/31/1997  . DIVERTICULOSIS, COLON 12/31/1997    Current Outpatient Medications on File Prior to Visit  Medication Sig Dispense Refill  . atorvastatin (LIPITOR) 80 MG tablet Take 1 tablet (80 mg total) by mouth daily. Follow-up appt w/labs due in November must see provider for future refills 30 tablet 0  . clopidogrel (PLAVIX) 75 MG tablet Take 1 tablet (75 mg total) by mouth daily. Follow-up appt due in Nov must see provider for future refills 90 tablet 0  . DULoxetine (CYMBALTA) 60 MG capsule TAKE 1 CAPSULE(60 MG) BY MOUTH DAILY 90 capsule 1  . gabapentin (NEURONTIN) 100 MG capsule Take 2 capsules (200 mg total) by mouth 2 (two) times daily. 120 capsule 6  . JANUVIA 50 MG tablet Take 50 mg by mouth daily.  0  . levothyroxine (SYNTHROID) 100 MCG tablet Take 1 tablet (100 mcg total) by mouth daily before breakfast. 90 tablet 1  . losartan (COZAAR) 25 MG tablet TAKE 1 TABLET(25 MG) BY MOUTH DAILY 90 tablet 1  . metFORMIN (GLUCOPHAGE) 1000 MG tablet TAKE 1 TABLET BY MOUTH TWICE DAILY WITH MEALS 180 tablet 1  . metoprolol tartrate (LOPRESSOR) 25 MG tablet TAKE  1 TABLET BY MOUTH TWICE DAILY 180 tablet 0  . nitroGLYCERIN (NITROSTAT) 0.4 MG SL tablet Place 1 tablet (0.4 mg total) under the tongue every 5 (five) minutes as needed. For chest pain 25 tablet 5  . ondansetron (ZOFRAN-ODT) 4 MG disintegrating tablet DISSOLVE 1 TABLET(4 MG) ON THE TONGUE EVERY 8 HOURS AS NEEDED FOR NAUSEA OR VOMITING 20 tablet 0  . pantoprazole (PROTONIX) 40 MG tablet Take  1 tablet (40 mg total) by mouth daily. 90 tablet 1   Current Facility-Administered Medications on File Prior to Visit  Medication Dose Route Frequency Provider Last Rate Last Admin  . 0.9 %  sodium chloride infusion  500 mL Intravenous Once Irene Shipper, MD        Past Medical History:  Diagnosis Date  . Anemia   . Anxiety   . Arthritis    "fingers" (05/19/2016  . CAD (coronary artery disease)    a. s/p CABG 1982 b. redo CABG 2003. c. [place] despite med rx 05/2016: successful but complicated PCI of PDA beyond graft site, c/b localized dissection requiring overlapping stent.  . Carotid bruit    a. Duplex 01/2015: patent vessels, 1-39% BICA, f/u PRN recommened.  . Colon polyp    adenomatous  . Colon, diverticulosis   . Degenerative joint disease   . Diastolic dysfunction   . Esophageal stricture   . GERD (gastroesophageal reflux disease)   . Hiatal hernia   . History of blood transfusion    "related to my bypass"  . Hyperlipidemia   . Hypertension   . Hypothyroidism   . Ischemic cardiomyopathy    a. Cath 05/2016: EF 40% with severe inferior wall HK, suspect hibernating myocardium.  . Myocardial infarction ([place]) 1977; 1982  . Osteoarthrosis, unspecified whether generalized or localized, unspecified site   . Presence of permanent cardiac pacemaker   . PVD (peripheral vascular disease) ([place])   . Stroke [facility])    greater than 2 years ago per husband  . Type II diabetes mellitus ([place])   . Unspecified hearing loss     Past Surgical History:  Procedure Laterality Date  . ABDOMINAL HYSTERECTOMY    . APPENDECTOMY    . BACK SURGERY    . CARDIAC CATHETERIZATION N/A 05/19/2016   Procedure: Left Heart Cath and Cors/Grafts Angiography;  Surgeon: Belva Crome, MD;  Location: [facility];  Service: Cardiovascular;  Laterality: N/A;  . CARDIAC CATHETERIZATION N/A 05/19/2016   Procedure: Coronary Stent Intervention;  Surgeon: Belva Crome, MD;  Location: [facility];  Service:  Cardiovascular;  Laterality: N/A;  . CATARACT EXTRACTION W/ INTRAOCULAR LENS  IMPLANT, BILATERAL Bilateral   . CHOLECYSTECTOMY OPEN    . COLONOSCOPY    . CORONARY ANGIOPLASTY WITH STENT PLACEMENT  05/19/2016   "2 stents?"  . CORONARY ARTERY BYPASS GRAFT  1982; 2003   x4(1982),02-2002 CABG X2  . ESOPHAGOGASTRODUODENOSCOPY (EGD) WITH ESOPHAGEAL DILATION  "2-3 times"  . INSERT / REPLACE / REMOVE PACEMAKER    . LEFT HEART CATHETERIZATION WITH CORONARY/GRAFT ANGIOGRAM N/A 03/05/2014   Procedure: LEFT HEART CATHETERIZATION WITH Beatrix Fetters;  Surgeon: Sinclair Grooms, MD;  Location: [facility];  Service: Cardiovascular;  Laterality: N/A;  . LUMBAR [place] SURGERY  01-2000  . OOPHORECTOMY Bilateral   . PERMANENT PACEMAKER INSERTION N/A 07/06/2012   MDT Adapta L implanted by Dr Rayann Heman for symptomatic bradycardia  . TONSILLECTOMY  1930s  . UPPER GASTROINTESTINAL ENDOSCOPY      Social History  Socioeconomic History  . Marital status: Married    Spouse name: [name]  . Number of children: 3  . Years of education: 62  . Highest education level: Not on file  Occupational History  . Occupation: Retired     Fish farm manager: RETIRED  Tobacco Use  . Smoking status: Former Smoker    Packs/day: 0.50    Years: 30.00    Pack years: 15.00    Types: Cigarettes    Quit date: 07/05/1976    Years since quitting: 44.3  . Smokeless tobacco: Never Used  Vaping Use  . Vaping Use: Never used  Substance and Sexual Activity  . Alcohol use: No    Alcohol/week: 0.0 standard drinks    Comment: "not anymore, I haven't in I don't know years"  . Drug use: No  . Sexual activity: Never  Other Topics Concern  . Not on file  Social History Narrative   Patient is married [name]) and lives at home with her husband.   Patient has three adult children.   Patient is retired.   Patient has a college education.   Patient is right-handed.   Patient does not drink any caffeine.   Social Determinants of Health    Financial Resource Strain: Low Risk   . Difficulty of Paying Living Expenses: Not hard at all  Food Insecurity: No Food Insecurity  . Worried About Charity fundraiser in the Last Year: Never true  . Ran Out of Food in the Last Year: Never true  Transportation Needs: No Transportation Needs  . Lack of Transportation (Medical): No  . Lack of Transportation (Non-Medical): No  Physical Activity: Unknown  . Days of Exercise per Week: Patient refused  . Minutes of Exercise per Session: Not on file  Stress: No Stress Concern Present  . Feeling of Stress : Not at all  Social Connections: Unknown  . Frequency of Communication with Friends and Family: More than three times a week  . Frequency of Social Gatherings with Friends and Family: More than three times a week  . Attends Religious Services: Patient refused  . Active Member of Clubs or Organizations: Patient refused  . Attends Archivist Meetings: Patient refused  . Marital Status: Married    Family History  Problem Relation Age of Onset  . Heart disease Sister   . Heart attack Sister   . Colon cancer Neg Hx   . Breast cancer Neg Hx   . Celiac disease Neg Hx   . Cirrhosis Neg Hx   . Clotting disorder Neg Hx   . Colitis Neg Hx   . Colon polyps Neg Hx   . Crohn's disease Neg Hx   . Cystic fibrosis Neg Hx   . Diabetes Neg Hx   . Esophageal cancer Neg Hx   . Hemochromatosis Neg Hx   . Inflammatory bowel disease Neg Hx   . Irritable bowel syndrome Neg Hx   . Kidney disease Neg Hx   . Liver cancer Neg Hx   . Liver disease Neg Hx   . Ovarian cancer Neg Hx   . Pancreatic cancer Neg Hx   . Prostate cancer Neg Hx   . Rectal cancer Neg Hx   . Stomach cancer Neg Hx   . Ulcerative colitis Neg Hx   . Uterine cancer Neg Hx   . Wilson's disease Neg Hx     Review of Systems  Constitutional: Negative for fever.  Gastrointestinal: Negative for abdominal pain and nausea.  Genitourinary: Positive for frequency. Negative  for dysuria and hematuria.       No cloudy urine       Objective:   Vitals:   10/17/20 1526  BP: 102/72  Pulse: 78  Temp: 98 F (36.7 C)  SpO2: 99%   BP Readings from Last 3 Encounters:  10/17/20 102/72  07/23/20 (!) 121/52  06/02/20 (!) 104/52   Wt Readings from Last 3 Encounters:  07/23/20 100 lb (45.4 kg)  07/08/20 100 lb (45.4 kg)  06/02/20 110 lb (49.9 kg)   Body mass index is 19.53 kg/m.   Physical Exam Constitutional:      General: She is not in acute distress.    Appearance: Normal appearance. She is not ill-appearing.  HENT:     Head: Normocephalic and atraumatic.  Abdominal:     General: There is no distension.     Palpations: Abdomen is soft.     Tenderness: There is no abdominal tenderness. There is no guarding or rebound.  Musculoskeletal:     Right lower leg: No edema.     Left lower leg: No edema.  Skin:    General: Skin is warm and dry.  Neurological:     Mental Status: She is alert.            Assessment & Plan:    See Problem List for Assessment and Plan of chronic medical problems.    This visit occurred during the SARS-CoV-2 public health emergency.  Safety protocols were in place, including screening questions prior to the visit, additional usage of staff PPE, and extensive cleaning of exam room while observing appropriate contact time as indicated for disinfecting solutions.

## 2020-10-17 ENCOUNTER — Encounter: Payer: Self-pay | Admitting: Internal Medicine

## 2020-10-17 ENCOUNTER — Ambulatory Visit (INDEPENDENT_AMBULATORY_CARE_PROVIDER_SITE_OTHER): Payer: Medicare Other | Admitting: Internal Medicine

## 2020-10-17 ENCOUNTER — Other Ambulatory Visit: Payer: Self-pay

## 2020-10-17 VITALS — BP 102/72 | HR 78 | Temp 98.0°F | Ht 60.0 in

## 2020-10-17 DIAGNOSIS — I200 Unstable angina: Secondary | ICD-10-CM | POA: Diagnosis not present

## 2020-10-17 DIAGNOSIS — R350 Frequency of micturition: Secondary | ICD-10-CM | POA: Diagnosis not present

## 2020-10-17 NOTE — Assessment & Plan Note (Signed)
?    Acute versus chronic She does have chronic bladder issues and has always had frequent urination-today she comes in complaining of frequent urination and it is unclear if it is worse or not We will check urine to rule out a urinary tract infection if she is able to provide a sample If there is no infection we can consider retrying medication for her bladder.  Over the years she has been on several and they did not help in the past She deferred seeing another doctor for further evaluation/treatment

## 2020-10-17 NOTE — Patient Instructions (Signed)
We will check your urine today.  If there is no infection we can try another medication to help with your overactive bladder.  These have not worked well in the past, but I think it is worth retrying one again.     We will call you with the urine results.

## 2020-10-24 ENCOUNTER — Other Ambulatory Visit: Payer: Self-pay | Admitting: Internal Medicine

## 2020-10-27 ENCOUNTER — Other Ambulatory Visit: Payer: Self-pay | Admitting: Internal Medicine

## 2020-10-29 ENCOUNTER — Ambulatory Visit: Payer: Medicare Other

## 2020-11-02 ENCOUNTER — Other Ambulatory Visit: Payer: Self-pay | Admitting: Internal Medicine

## 2020-11-02 DIAGNOSIS — N183 Chronic kidney disease, stage 3 unspecified: Secondary | ICD-10-CM | POA: Insufficient documentation

## 2020-11-02 DIAGNOSIS — I200 Unstable angina: Secondary | ICD-10-CM

## 2020-11-02 DIAGNOSIS — I25700 Atherosclerosis of coronary artery bypass graft(s), unspecified, with unstable angina pectoris: Secondary | ICD-10-CM

## 2020-11-02 NOTE — Patient Instructions (Addendum)
  Blood work was ordered.     Medications changes include :   Start Claritin daily for your cough ( from post-nasal drip).  Continue the gabapentin ( ? Still taking) 200 mg twice daily. Take Tylenol as needed for your hand arthritis or back pain, which is likely from arthritis.     Your prescription(s) have been submitted to your pharmacy. Please take as directed and contact our office if you believe you are having problem(s) with the medication(s).     Please followup in 6 months

## 2020-11-02 NOTE — Progress Notes (Signed)
Subjective:    Patient ID: Janet Steele, female    DOB: 1929-05-09, 86 y.o.   MRN: [id number]  HPI The patient is here for follow up of their chronic medical problems, including DM, htn, hypothyroidism, hyperlipidemia, GERD, dementia, h/o CVA, depression, anxiety, insomnia, CKD  She is a poor historian.  She states she is taking her medication, but does not know what she is taking.   She still has daily headaches in the posterior head.  She states it started after the food try hit her head there.    Last week she was having lower back pain.  She was using a heating pad.  Right now she does not have pain but she is just sitting here.  If she lays down it goes away after 10-15 minutes, but it comes back if she starts to do anything.  She states it was evaluated by ortho and there was nothing they could do.     Medications and allergies reviewed with patient and updated if appropriate.  Patient Active Problem List   Diagnosis Date Noted   Aortic atherosclerosis ([place]) 11/03/2020   CKD (chronic kidney disease) stage 3, GFR 30-59 ml/min (HCC) 11/02/2020   Urinary frequency 10/17/2020   Non-intractable vomiting 05/09/2020   Poor balance 04/29/2020   Physical deconditioning 04/29/2020   Cold extremities 02/19/2020   Colitis 01/15/2020   Headache 07/21/2019   Lower abdominal pain 02/05/2019   Other headache syndrome 08/14/2018   Depression 08/14/2018   Weight loss 06/05/2018   Stroke ([place]) 11/04/2017   Weakness 11/03/2017   History of cerebrovascular accident (CVA) with residual deficit 11/03/2017   Chronic back pain 07/04/2017   PAD (peripheral artery disease) ([place]) 01/17/2017   Overactive bladder 12/17/2016   Anemia 05/20/2016   Coronary artery disease involving coronary bypass graft of native heart without angina pectoris 05/19/2016   Numbness and tingling of foot 12/31/2015   MCI (mild cognitive impairment) with memory loss 11/18/2015   Facial tic  05/07/2015   Sick sinus syndrome ([place]) 02/26/2015   Cervical dystonia 12/04/2014   Chronic motor tic 09/12/2014   Pacemaker-Medtronic 07/07/2012   GERD (gastroesophageal reflux disease) 07/05/2012   Cough 08/04/2010   Abnormal involuntary movement 12/17/2009   Disturbance in sleep behavior 12/11/2009   CAD, ARTERY BYPASS GRAFT 09/24/2009   CAROTID BRUIT 09/24/2009   Diabetes ([place]) 01/30/2009   Anxiety 02/14/2008   EROSIVE ESOPHAGITIS 02/14/2008   CONSTIPATION, CHRONIC 02/14/2008   DEGENERATIVE JOINT DISEASE 02/14/2008   HEARING LOSS 01/04/2008   PVD 01/04/2008   Hypothyroidism 09/25/2007   HYPERLIPIDEMIA 09/25/2007   Essential hypertension 09/07/2007   Diabetic polyneuropathy ([place]) 03/16/2007   Stricture and stenosis of esophagus 03/16/2007   HIATAL HERNIA 02/16/2007   HEMORRHOIDS 12/31/1997   DIVERTICULOSIS, COLON 12/31/1997    Current Outpatient Medications on File Prior to Visit  Medication Sig Dispense Refill   atorvastatin (LIPITOR) 80 MG tablet TAKE 1 TABLET(80 MG) BY MOUTH DAILY. FOLLOW-UP APPOINTMENT WITH LABS DUE IN NOVEMBER 30 tablet 0   clopidogrel (PLAVIX) 75 MG tablet Take 1 tablet (75 mg total) by mouth daily. Follow-up appt due in Nov must see provider for future refills 90 tablet 0   DULoxetine (CYMBALTA) 60 MG capsule TAKE 1 CAPSULE(60 MG) BY MOUTH DAILY 90 capsule 1   gabapentin (NEURONTIN) 100 MG capsule Take 2 capsules (200 mg total) by mouth 2 (two) times daily. 120 capsule 6   JANUVIA 50 MG tablet Take 50 mg by mouth daily.  0  levothyroxine (SYNTHROID) 100 MCG tablet Take 1 tablet (100 mcg total) by mouth daily before breakfast. 90 tablet 1   losartan (COZAAR) 25 MG tablet TAKE 1 TABLET(25 MG) BY MOUTH DAILY 90 tablet 1   metFORMIN (GLUCOPHAGE) 1000 MG tablet TAKE 1 TABLET BY MOUTH TWICE DAILY WITH MEALS 180 tablet 1   metoprolol tartrate (LOPRESSOR) 25 MG tablet TAKE 1 TABLET BY MOUTH TWICE DAILY 180 tablet 0    nitroGLYCERIN (NITROSTAT) 0.4 MG SL tablet Place 1 tablet (0.4 mg total) under the tongue every 5 (five) minutes as needed. For chest pain 25 tablet 5   ondansetron (ZOFRAN-ODT) 4 MG disintegrating tablet DISSOLVE 1 TABLET(4 MG) ON THE TONGUE EVERY 8 HOURS AS NEEDED FOR NAUSEA OR VOMITING 20 tablet 0   pantoprazole (PROTONIX) 40 MG tablet Take 1 tablet (40 mg total) by mouth daily. 90 tablet 1   No current facility-administered medications on file prior to visit.    Past Medical History:  Diagnosis Date   Anemia    Anxiety    Arthritis    "fingers" (05/19/2016   CAD (coronary artery disease)    a. s/p CABG 1982 b. redo CABG 2003. c. [place] despite med rx 05/2016: successful but complicated PCI of PDA beyond graft site, c/b localized dissection requiring overlapping stent.   Carotid bruit    a. Duplex 01/2015: patent vessels, 1-39% BICA, f/u PRN recommened.   Colon polyp    adenomatous   Colon, diverticulosis    Degenerative joint disease    Diastolic dysfunction    Esophageal stricture    GERD (gastroesophageal reflux disease)    Hiatal hernia    History of blood transfusion    "related to my bypass"   Hyperlipidemia    Hypertension    Hypothyroidism    Ischemic cardiomyopathy    a. Cath 05/2016: EF 40% with severe inferior wall HK, suspect hibernating myocardium.   Myocardial infarction ([place]) 1977; 1982   Osteoarthrosis, unspecified whether generalized or localized, unspecified site    Presence of permanent cardiac pacemaker    PVD (peripheral vascular disease) ([place])    Stroke ([place])    greater than 2 years ago per husband   Type II diabetes mellitus ([place])    Unspecified hearing loss     Past Surgical History:  Procedure Laterality Date   ABDOMINAL HYSTERECTOMY     APPENDECTOMY     BACK SURGERY     CARDIAC CATHETERIZATION N/A 05/19/2016   Procedure: Left Heart Cath and Cors/Grafts Angiography;  Surgeon: Belva Crome, MD;  Location: [facility];  Service: Cardiovascular;  Laterality: N/A;   CARDIAC CATHETERIZATION N/A 05/19/2016   Procedure: Coronary Stent Intervention;  Surgeon: Belva Crome, MD;  Location: [facility];  Service: Cardiovascular;  Laterality: N/A;   CATARACT EXTRACTION W/ INTRAOCULAR LENS  IMPLANT, BILATERAL Bilateral    CHOLECYSTECTOMY OPEN     COLONOSCOPY     CORONARY ANGIOPLASTY WITH STENT PLACEMENT  05/19/2016   "2 stents?"   CORONARY ARTERY BYPASS GRAFT  1982; 2003   x4(1982),02-2002 CABG X2   ESOPHAGOGASTRODUODENOSCOPY (EGD) WITH ESOPHAGEAL DILATION  "2-3 times"   INSERT / REPLACE / REMOVE PACEMAKER     LEFT HEART CATHETERIZATION WITH CORONARY/GRAFT ANGIOGRAM N/A 03/05/2014   Procedure: LEFT HEART CATHETERIZATION WITH Beatrix Fetters;  Surgeon: Sinclair Grooms, MD;  [facility];  Service: Cardiovascular;  Laterality: N/A;   LUMBAR DISC SURGERY  01-2000   OOPHORECTOMY Bilateral  PERMANENT PACEMAKER INSERTION N/A 07/06/2012   MDT Adapta L implanted by Dr Rayann Heman for symptomatic bradycardia   TONSILLECTOMY  1930s   UPPER GASTROINTESTINAL ENDOSCOPY      Social History   Socioeconomic History   Marital status: Married    Spouse name: [name]   Number of children: 3   Years of education: 16   Highest education level: Not on file  Occupational History   Occupation: Retired     Fish farm manager: RETIRED  Tobacco Use   Smoking status: Former Smoker    Packs/day: 0.50    Years: 30.00    Pack years: 15.00    Types: Cigarettes    Quit date: 07/05/1976    Years since quitting: 44.3   Smokeless tobacco: Never Used  Vaping Use   Vaping Use: Never used  Substance and Sexual Activity   Alcohol use: No    Alcohol/week: 0.0 standard drinks    Comment: "not anymore, I haven't in I don't know years"   Drug use: No   Sexual activity: Never  Other Topics Concern   Not on file  Social History Narrative   Patient is married [name]) and lives at home with her  husband.   Patient has three adult children.   Patient is retired.   Patient has a college education.   Patient is right-handed.   Patient does not drink any caffeine.   Social Determinants of Health   Financial Resource Strain: Low Risk    Difficulty of Paying Living Expenses: Not hard at all  Food Insecurity: No Food Insecurity   Worried About Charity fundraiser in the Last Year: Never true   Man in the Last Year: Never true  Transportation Needs: No Transportation Needs   Lack of Transportation (Medical): No   Lack of Transportation (Non-Medical): No  Physical Activity: Unknown   Days of Exercise per Week: Patient refused   Minutes of Exercise per Session: Not on file  Stress: No Stress Concern Present   Feeling of Stress : Not at all  Social Connections: Unknown   Frequency of Communication with Friends and Family: More than three times a week   Frequency of Social Gatherings with Friends and Family: More than three times a week   Attends Religious Services: Patient refused   Marine scientist or Organizations: Patient refused   Attends Music therapist: Patient refused   Marital Status: Married    Family History  Problem Relation Age of Onset   Heart disease Sister    Heart attack Sister    Colon cancer Neg Hx    Breast cancer Neg Hx    Celiac disease Neg Hx    Cirrhosis Neg Hx    Clotting disorder Neg Hx    Colitis Neg Hx    Colon polyps Neg Hx    Crohn's disease Neg Hx    Cystic fibrosis Neg Hx    Diabetes Neg Hx    Esophageal cancer Neg Hx    Hemochromatosis Neg Hx    Inflammatory bowel disease Neg Hx    Irritable bowel syndrome Neg Hx    Kidney disease Neg Hx    Liver cancer Neg Hx    Liver disease Neg Hx    Ovarian cancer Neg Hx    Pancreatic cancer Neg Hx    Prostate cancer Neg Hx    Rectal cancer Neg Hx    Stomach cancer Neg Hx    Ulcerative colitis Neg  Hx    Uterine cancer Neg  Hx    Wilson's disease Neg Hx     Review of Systems  Constitutional: Negative for fever.  HENT: Positive for postnasal drip.   Respiratory: Positive for cough (? from PND). Negative for shortness of breath and wheezing.   Cardiovascular: Negative for chest pain, palpitations and leg swelling.  Gastrointestinal: Negative for abdominal pain and nausea.       No gerd  Musculoskeletal: Positive for back pain. Negative for neck pain.  Neurological: Positive for dizziness (when turning to left) and headaches (chronic- left posterior head).       Objective:   Vitals:   11/03/20 1430  BP: 120/70  Pulse: 79  Temp: 97.9 F (36.6 C)  SpO2: 97%   BP Readings from Last 3 Encounters:  11/03/20 120/70  10/17/20 102/72  07/23/20 (!) 121/52   Wt Readings from Last 3 Encounters:  11/03/20 114 lb 12.8 oz (52.1 kg)  07/23/20 100 lb (45.4 kg)  07/08/20 100 lb (45.4 kg)   Body mass index is 22.42 kg/m.   Physical Exam    Constitutional: Appears well-developed and well-nourished. No distress.  HENT:  Head: Normocephalic and atraumatic.  Neck: Neck supple. No tracheal deviation present. No thyromegaly present.  No cervical lymphadenopathy Cardiovascular: Normal rate, regular rhythm and normal heart sounds.   No murmur heard. No carotid bruit .  No edema Pulmonary/Chest: Effort normal and breath sounds normal. No respiratory distress. No has no wheezes. No rales.  Skin: Skin is warm and dry. Not diaphoretic.  Psychiatric: Normal mood and affect. Behavior is normal.   Diabetic Foot Exam - Simple   Simple Foot Form Diabetic Foot exam was performed with the following findings: Yes 11/03/2020  2:59 PM  Visual Inspection No deformities, no ulcerations, no other skin breakdown bilaterally: Yes Sensation Testing Intact to touch and monofilament testing bilaterally: Yes Pulse Check Posterior Tibialis and Dorsalis pulse intact bilaterally: Yes Comments       Assessment & Plan:     See Problem List for Assessment and Plan of chronic medical problems.    This visit occurred during the SARS-CoV-2 public health emergency.  Safety protocols were in place, including screening questions prior to the visit, additional usage of staff PPE, and extensive cleaning of exam room while observing appropriate contact time as indicated for disinfecting solutions.

## 2020-11-03 ENCOUNTER — Encounter: Payer: Self-pay | Admitting: Internal Medicine

## 2020-11-03 ENCOUNTER — Other Ambulatory Visit: Payer: Self-pay

## 2020-11-03 ENCOUNTER — Ambulatory Visit (INDEPENDENT_AMBULATORY_CARE_PROVIDER_SITE_OTHER): Payer: Medicare Other | Admitting: Internal Medicine

## 2020-11-03 VITALS — BP 120/70 | HR 79 | Temp 97.9°F | Ht 60.0 in | Wt 114.8 lb

## 2020-11-03 DIAGNOSIS — F3289 Other specified depressive episodes: Secondary | ICD-10-CM | POA: Diagnosis not present

## 2020-11-03 DIAGNOSIS — I25700 Atherosclerosis of coronary artery bypass graft(s), unspecified, with unstable angina pectoris: Secondary | ICD-10-CM

## 2020-11-03 DIAGNOSIS — K219 Gastro-esophageal reflux disease without esophagitis: Secondary | ICD-10-CM

## 2020-11-03 DIAGNOSIS — G4489 Other headache syndrome: Secondary | ICD-10-CM

## 2020-11-03 DIAGNOSIS — E1165 Type 2 diabetes mellitus with hyperglycemia: Secondary | ICD-10-CM

## 2020-11-03 DIAGNOSIS — I200 Unstable angina: Secondary | ICD-10-CM | POA: Diagnosis not present

## 2020-11-03 DIAGNOSIS — G8929 Other chronic pain: Secondary | ICD-10-CM

## 2020-11-03 DIAGNOSIS — N1832 Chronic kidney disease, stage 3b: Secondary | ICD-10-CM | POA: Diagnosis not present

## 2020-11-03 DIAGNOSIS — I6930 Unspecified sequelae of cerebral infarction: Secondary | ICD-10-CM | POA: Diagnosis not present

## 2020-11-03 DIAGNOSIS — F419 Anxiety disorder, unspecified: Secondary | ICD-10-CM | POA: Diagnosis not present

## 2020-11-03 DIAGNOSIS — I700 Atherosclerosis of aorta: Secondary | ICD-10-CM

## 2020-11-03 DIAGNOSIS — E039 Hypothyroidism, unspecified: Secondary | ICD-10-CM | POA: Diagnosis not present

## 2020-11-03 DIAGNOSIS — G3184 Mild cognitive impairment, so stated: Secondary | ICD-10-CM

## 2020-11-03 DIAGNOSIS — M545 Low back pain, unspecified: Secondary | ICD-10-CM

## 2020-11-03 DIAGNOSIS — I10 Essential (primary) hypertension: Secondary | ICD-10-CM

## 2020-11-03 DIAGNOSIS — E782 Mixed hyperlipidemia: Secondary | ICD-10-CM | POA: Diagnosis not present

## 2020-11-03 LAB — CBC WITH DIFFERENTIAL/PLATELET
Basophils Absolute: 0 10*3/uL (ref 0.0–0.1)
Basophils Relative: 0.5 % (ref 0.0–3.0)
Eosinophils Absolute: 0.1 10*3/uL (ref 0.0–0.7)
Eosinophils Relative: 1.2 % (ref 0.0–5.0)
HCT: 33 % — ABNORMAL LOW (ref 36.0–46.0)
Hemoglobin: 11 g/dL — ABNORMAL LOW (ref 12.0–15.0)
Lymphocytes Relative: 20.1 % (ref 12.0–46.0)
Lymphs Abs: 1.5 10*3/uL (ref 0.7–4.0)
MCHC: 33.2 g/dL (ref 30.0–36.0)
MCV: 79.7 fl (ref 78.0–100.0)
Monocytes Absolute: 0.4 10*3/uL (ref 0.1–1.0)
Monocytes Relative: 5.9 % (ref 3.0–12.0)
Neutro Abs: 5.4 10*3/uL (ref 1.4–7.7)
Neutrophils Relative %: 72.3 % (ref 43.0–77.0)
Platelets: 276 10*3/uL (ref 150.0–400.0)
RBC: 4.14 Mil/uL (ref 3.87–5.11)
RDW: 15.9 % — ABNORMAL HIGH (ref 11.5–15.5)
WBC: 7.5 10*3/uL (ref 4.0–10.5)

## 2020-11-03 LAB — COMPREHENSIVE METABOLIC PANEL
ALT: 10 U/L (ref 0–35)
AST: 18 U/L (ref 0–37)
Albumin: 4.5 g/dL (ref 3.5–5.2)
Alkaline Phosphatase: 81 U/L (ref 39–117)
BUN: 26 mg/dL — ABNORMAL HIGH (ref 6–23)
CO2: 25 mEq/L (ref 19–32)
Calcium: 9.5 mg/dL (ref 8.4–10.5)
Chloride: 102 mEq/L (ref 96–112)
Creatinine, Ser: 1.19 mg/dL (ref 0.40–1.20)
GFR: 40.67 mL/min — ABNORMAL LOW (ref >60.00)
Glucose, Bld: 89 mg/dL (ref 70–99)
Potassium: 4.7 mEq/L (ref 3.5–5.1)
Sodium: 136 mEq/L (ref 135–145)
Total Bilirubin: 1.1 mg/dL (ref 0.2–1.2)
Total Protein: 7.5 g/dL (ref 6.0–8.3)

## 2020-11-03 LAB — HEMOGLOBIN A1C: Hgb A1c MFr Bld: 7 % — ABNORMAL HIGH (ref 4.6–6.5)

## 2020-11-03 LAB — LIPID PANEL
Cholesterol: 205 mg/dL — ABNORMAL HIGH (ref 0–200)
HDL: 51 mg/dL (ref >39.00)
LDL Cholesterol: 126 mg/dL — ABNORMAL HIGH (ref 0–99)
NonHDL: 154
Total CHOL/HDL Ratio: 4
Triglycerides: 141 mg/dL (ref 0.0–149.0)
VLDL: 28.2 mg/dL (ref 0.0–40.0)

## 2020-11-03 MED ORDER — LORATADINE 10 MG PO TABS: 10.0000 mg | ORAL_TABLET | Freq: Every day | ORAL | 11 refills | Status: DC

## 2020-11-03 MED ORDER — DULOXETINE HCL 60 MG PO CPEP
ORAL_CAPSULE | ORAL | 1 refills | Status: DC
Start: 2020-11-03 — End: 2021-03-18

## 2020-11-03 MED ORDER — PANTOPRAZOLE SODIUM 40 MG PO TBEC
40.0000 mg | DELAYED_RELEASE_TABLET | Freq: Every day | ORAL | 1 refills | Status: DC
Start: 2020-11-03 — End: 2021-03-18

## 2020-11-03 MED ORDER — LEVOTHYROXINE SODIUM 100 MCG PO TABS
100.0000 ug | ORAL_TABLET | Freq: Every day | ORAL | 1 refills | Status: DC
Start: 2020-11-03 — End: 2020-12-17

## 2020-11-03 MED ORDER — GABAPENTIN 100 MG PO CAPS
200.0000 mg | ORAL_CAPSULE | Freq: Two times a day (BID) | ORAL | 1 refills | Status: DC
Start: 2020-11-03 — End: 2021-02-24

## 2020-11-03 MED ORDER — METOPROLOL TARTRATE 25 MG PO TABS: 25.0000 mg | ORAL_TABLET | Freq: Two times a day (BID) | ORAL | 1 refills | Status: DC

## 2020-11-03 MED ORDER — CLOPIDOGREL BISULFATE 75 MG PO TABS: 75.0000 mg | ORAL_TABLET | Freq: Every day | ORAL | 2 refills | Status: DC

## 2020-11-03 MED ORDER — ATORVASTATIN CALCIUM 80 MG PO TABS
ORAL_TABLET | ORAL | 1 refills | Status: DC
Start: 2020-11-03 — End: 2021-03-18

## 2020-11-03 MED ORDER — LOSARTAN POTASSIUM 25 MG PO TABS
ORAL_TABLET | ORAL | 1 refills | Status: DC
Start: 2020-11-03 — End: 2021-02-25

## 2020-11-03 NOTE — Assessment & Plan Note (Signed)
Chronic Has remained stable CMP

## 2020-11-03 NOTE — Assessment & Plan Note (Signed)
Chronic Controlled, stable as far as I can tell, but she is a poor historian Continue Cymbalta 60 mg daily

## 2020-11-03 NOTE — Addendum Note (Signed)
Addended by: Boris Lown B on: 11/03/2020 03:08 PM   Modules accepted: Orders

## 2020-11-03 NOTE — Assessment & Plan Note (Signed)
Chronic On atorvastatin 80 mg daily Check lipid panel today - is at goal if she takes her medication daily but does not always do that due to her dementia Not exercising Diet is healthy
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
## 2020-11-03 NOTE — Assessment & Plan Note (Signed)
Chronic Check lipid panel  Continue atorvastatin 80 mg daily Regular exercise and healthy diet encouraged  

## 2020-11-03 NOTE — Assessment & Plan Note (Signed)
Chronic GERD controlled Continue pantoprazole 40 mg daily 

## 2020-11-03 NOTE — Assessment & Plan Note (Signed)
Chronic BP well controlled Continue losartan 25 mg daily, metoprolol 25 mg BID cmp

## 2020-11-03 NOTE — Assessment & Plan Note (Signed)
Chronic Complains of back pain today, but this is an ongoing issue with that has been intermittent Likely related to osteoarthritis In the past she was on tramadol, but given her dementia and high risk fall would like to avoid this Advised that she take Tylenol as needed

## 2020-11-03 NOTE — Assessment & Plan Note (Signed)
Chronic Continue Plavix 75 mg daily, atorvastatin 80 mg daily Blood pressure well controlled CBC, CMP, lipid panel

## 2020-11-03 NOTE — Assessment & Plan Note (Signed)
Chronic Controlled, stable Continue Cymbalta 60 mg daily  

## 2020-11-03 NOTE — Assessment & Plan Note (Signed)
Chronic Sugars have been well controlled We will continue Metformin 1000 mg twice daily, but will try to decrease that given CKD Encouraged healthy diet

## 2020-11-03 NOTE — Assessment & Plan Note (Signed)
Chronic Following with neurology Not currently on any medication

## 2020-11-03 NOTE — Assessment & Plan Note (Signed)
Chronic  Clinically euthyroid Currently taking levothyroxine 100 mcg daily Check tsh  Titrate med dose if needed  

## 2020-11-04 ENCOUNTER — Telehealth: Payer: Self-pay | Admitting: Internal Medicine

## 2020-11-04 LAB — TSH: TSH: 86.5 u[IU]/mL — ABNORMAL HIGH (ref 0.35–4.50)

## 2020-11-04 NOTE — Telephone Encounter (Signed)
Patients husband calling and has questions as well as needs clarification about 6 of his wives medication.  [phone]

## 2020-11-06 ENCOUNTER — Telehealth: Payer: Self-pay | Admitting: Internal Medicine

## 2020-11-06 NOTE — Telephone Encounter (Signed)
Please call her husband to review labs --    She still has mild anemia, but it is improving. liver tests are normal.  Kidney function is decreased but stable. Her cholesterol is not as good as it should be - I doubt she is taking her medication daily.  This does help prevent heart attacks and additional strokes so is important she takes it daily.    Her thyroid function is very very underactive.  This can cause a lot of problems.  She is not taking her medication she ideally we need to figure how to get her to take this daily.  This is very important.   No change in meds - we just have to get her to take all of her meds daily.

## 2020-11-07 NOTE — Telephone Encounter (Signed)
Spoke with patient's husband today 

## 2020-11-07 NOTE — Telephone Encounter (Signed)
Spoke with patient today.

## 2020-11-07 NOTE — Telephone Encounter (Signed)
Results given to Mr. Tischer today.

## 2020-11-10 NOTE — Progress Notes (Signed)
CARDIOLOGY OFFICE NOTE  Date:  11/24/2020    Rob Bunting Date of Birth: 11-Jul-1929 Medical Record #[id number]  PCP:  Binnie Rail, MD  Cardiologist:  Burt Knack  Chief Complaint  Patient presents with  . Follow-up    Seen for Dr. Burt Knack    History of Present Illness: Janet Steele is a 86 y.o. female who presents today for a one year check. Seen for Dr. Burt Knack.   She has a history of known CAD with remote CABG in 1992, redo in 2003 with chronic angina, HTN, HLD, DM and PPM due to symptomatic bradycardia.   Last seen by Dr. Burt Knack one year ago - may have been skipping some medicines. Cardiac status felt to be ok.   Comes in today. Here with her husband today - I saw him recently for his visit. She is very hard of hearing.  She is spending lots of time in the bed. He is having a hard time getting her to take her medicines - TSh is 74. She complains about no energy and having trouble walking. No chest pain.   Past Medical History:  Diagnosis Date  . Anemia   . Anxiety   . Arthritis    "fingers" (05/19/2016  . CAD (coronary artery disease)    a. s/p CABG 1982 b. redo CABG 2003. c. [place] despite med rx 05/2016: successful but complicated PCI of PDA beyond graft site, c/b localized dissection requiring overlapping stent.  . Carotid bruit    a. Duplex 01/2015: patent vessels, 1-39% BICA, f/u PRN recommened.  . Colon polyp    adenomatous  . Colon, diverticulosis   . Degenerative joint disease   . Diastolic dysfunction   . Esophageal stricture   . GERD (gastroesophageal reflux disease)   . Hiatal hernia   . History of blood transfusion    "related to my bypass"  . Hyperlipidemia   . Hypertension   . Hypothyroidism   . Ischemic cardiomyopathy    a. Cath 05/2016: EF 40% with severe inferior wall HK, suspect hibernating myocardium.  . Myocardial infarction ([place]) 1977; 1982  . Osteoarthrosis, unspecified whether generalized or localized, unspecified site   . Presence of  permanent cardiac pacemaker   . PVD (peripheral vascular disease) ([place])   . Stroke [facility])    greater than 2 years ago per husband  . Type II diabetes mellitus ([place])   . Unspecified hearing loss     Past Surgical History:  Procedure Laterality Date  . ABDOMINAL HYSTERECTOMY    . APPENDECTOMY    . BACK SURGERY    . CARDIAC CATHETERIZATION N/A 05/19/2016   Procedure: Left Heart Cath and Cors/Grafts Angiography;  Surgeon: Belva Crome, MD;  Location: [place] [facility];  Service: Cardiovascular;  Laterality: N/A;  . CARDIAC CATHETERIZATION N/A 05/19/2016   Procedure: Coronary Stent Intervention;  Surgeon: Belva Crome, MD;  Location: [place] [facility];  Service: Cardiovascular;  Laterality: N/A;  . CATARACT EXTRACTION W/ INTRAOCULAR LENS  IMPLANT, BILATERAL Bilateral   . CHOLECYSTECTOMY OPEN    . COLONOSCOPY    . CORONARY ANGIOPLASTY WITH STENT PLACEMENT  05/19/2016   "2 stents?"  . CORONARY ARTERY BYPASS GRAFT  1982; 2003   x4(1982),02-2002 CABG X2  . ESOPHAGOGASTRODUODENOSCOPY (EGD) WITH ESOPHAGEAL DILATION  "2-3 times"  . INSERT / REPLACE / REMOVE PACEMAKER    . LEFT HEART CATHETERIZATION WITH CORONARY/GRAFT ANGIOGRAM N/A 03/05/2014   Procedure: LEFT HEART CATHETERIZATION WITH CORONARY/GRAFT ANGIOGRAM;  Surgeon: Sinclair Grooms, MD;  Location: [facility] C[facility];  Service: Cardiovascular;  Laterality: N/A;  . LUMBAR [place] SURGERY  01-2000  . OOPHORECTOMY Bilateral   . PERMANENT PACEMAKER INSERTION N/A 07/06/2012   MDT Adapta L implanted by Dr Rayann Heman for symptomatic bradycardia  . TONSILLECTOMY  1930s  . UPPER GASTROINTESTINAL ENDOSCOPY       Medications: Current Meds  Medication Sig  . atorvastatin (LIPITOR) 80 MG tablet TAKE 1 TABLET(80 MG) BY MOUTH DAILY. FOLLOW-UP APPOINTMENT WITH LABS DUE IN [place]  . clopidogrel (PLAVIX) 75 MG tablet Take 1 tablet (75 mg total) by mouth daily.  . DULoxetine (CYMBALTA) 60 MG capsule TAKE 1 CAPSULE(60 MG) BY MOUTH DAILY  . gabapentin (NEURONTIN)  100 MG capsule Take 2 capsules (200 mg total) by mouth 2 (two) times daily.  Marland Kitchen levothyroxine (SYNTHROID) 100 MCG tablet Take 1 tablet (100 mcg total) by mouth daily before breakfast.  . loratadine (CLARITIN) 10 MG tablet Take 1 tablet (10 mg total) by mouth daily.  Marland Kitchen losartan (COZAAR) 25 MG tablet TAKE 1 TABLET(25 MG) BY MOUTH DAILY  . metFORMIN (GLUCOPHAGE) 1000 MG tablet TAKE 1 TABLET BY MOUTH TWICE DAILY WITH MEALS  . metoprolol tartrate (LOPRESSOR) 25 MG tablet Take 1 tablet (25 mg total) by mouth 2 (two) times daily.  . nitroGLYCERIN (NITROSTAT) 0.4 MG SL tablet Place 1 tablet (0.4 mg total) under the tongue every 5 (five) minutes as needed. For chest pain  . pantoprazole (PROTONIX) 40 MG tablet Take 1 tablet (40 mg total) by mouth daily.     Allergies: Allergies  Allergen Reactions  . Morphine Hives, Itching and Rash  . Penicillins Itching and Rash    Has patient had a PCN reaction causing immediate rash, facial/tongue/throat swelling, SOB or lightheadedness with hypotension: Yes Has patient had a PCN reaction causing severe rash involving mucus membranes or skin necrosis: No Has patient had a PCN reaction that required hospitalization No Has patient had a PCN reaction occurring within the last 10 years: No If all of the above answers are "NO", then may proceed with Cephalosporin use.   . Prednisone Other (See Comments)    MEMORY LOSS   . Sulfonamide Derivatives Itching and Rash  . Codeine Rash  . Hydrocodone-Acetaminophen Other (See Comments)    unknown  . Meperidine Hcl Itching  . Metoclopramide Hcl Itching and Rash  . Metoprolol Succinate Other (See Comments)    nervous  . Moxifloxacin Itching  . Oxycodone-Aspirin Rash  . Pentazocine Lactate Nausea Only  . Pioglitazone Other (See Comments)    bloating    Social History: The patient  reports that she quit smoking about 44 years ago. Her smoking use included cigarettes. She has a 15.00 pack-year smoking history. She has  never used smokeless tobacco. She reports that she does not drink alcohol and does not use drugs.   Family History: The patient's family history includes Heart attack in her sister; Heart disease in her sister.   Review of Systems: Please see the history of present illness.   All other systems are reviewed and negative.   Physical Exam: VS:  BP 100/60   Pulse 63   Ht 5' (1.524 m)   Wt 117 lb (53.1 kg)   SpO2 94%   BMI 22.85 kg/m  .  BMI Body mass index is 22.85 kg/m.  Wt Readings from Last 3 Encounters:  11/24/20 117 lb (53.1 kg)  11/03/20 114 lb 12.8 oz (52.1 kg)  07/23/20 100  lb (45.4 kg)   BP is 110/60 by me.   General: Elderly. Frail. She is alert and in no acute distress. Very hard of hearing.   Cardiac: Regular rate and rhythm. No murmurs, rubs, or gallops. No edema.  Respiratory:  Lungs are clear to auscultation bilaterally with normal work of breathing.  GI: Soft and nontender.  MS: No deformity or atrophy. Gait and ROM intact but moving slow - using a walker.  Skin: Warm and dry. Color is normal.  Neuro:  Strength and sensation are intact and no gross focal deficits noted.  Psych: Alert, appropriate and with normal affect.   LABORATORY DATA:  EKG:  EKG is not ordered today.    Lab Results  Component Value Date   WBC 7.5 11/03/2020   HGB 11.0 (L) 11/03/2020   HCT 33.0 (L) 11/03/2020   PLT 276.0 11/03/2020   GLUCOSE 89 11/03/2020   CHOL 205 (H) 11/03/2020   TRIG 141.0 11/03/2020   HDL 51.00 11/03/2020   LDLDIRECT 254.0 11/01/2017   LDLCALC 126 (H) 11/03/2020   ALT 10 11/03/2020   AST 18 11/03/2020   NA 136 11/03/2020   K 4.7 11/03/2020   CL 102 11/03/2020   CREATININE 1.19 11/03/2020   BUN 26 (H) 11/03/2020   CO2 25 11/03/2020   TSH 86.50 (H) 11/03/2020   INR 0.99 04/04/2018   HGBA1C 7.0 (H) 11/03/2020   MICROALBUR 3.6 (H) 05/28/2010     BNP (last 3 results) No results for input(s): BNP in the last 8760 hours.  ProBNP (last 3 results) No  results for input(s): PROBNP in the last 8760 hours.   Other Studies Reviewed Today:  Echo Study Conclusions 10/2017  - Left ventricle: The cavity size was normal. Wall thickness was  normal. Systolic function was normal. The estimated ejection  fraction was in the range of 60% to 65%. Wall motion was normal;  there were no regional wall motion abnormalities. Doppler  parameters are consistent with abnormal left ventricular  relaxation (grade 1 diastolic dysfunction).  - Aortic valve: Trileaflet; mildly thickened, mildly calcified  leaflets.  - Right ventricle: Pacer wire or catheter noted in right ventricle.   Impressions:   - No cardiac source of emboli was indentified. Compared to the  prior study, there has been no significant interval change.    ASSESSMENT & PLAN:    1. CAD with chronic angina - chest pain not really endorsed today - would favor conservative management.   2. HTN - BP by me is ok - unclear what she is actually taking.   3. HLD - remains on statin - doubt taking consistently. I would favor trying to simplify her regimen.   4. DM  5. Hypothyroid - not taking her Synthroid - encouraged her at length to take. I think this could actually help her but may be futile.   6.  Advanced age   Current medicines are reviewed with the patient today.  The patient does not have concerns regarding medicines other than what has been noted above.  The following changes have been made:  See above.  Labs/ tests ordered today include:   No orders of the defined types were placed in this encounter.    Disposition:   FU with Dr. Burt Knack and his team in one year.     Patient is agreeable to this plan and will call if any problems develop in the interim.   SignedTruitt Merle, NP  11/24/2020 2:04 PM  [facility] [address] [place], [place]  [address] Phone: [phone] Fax: [phone]

## 2020-11-11 ENCOUNTER — Telehealth: Payer: Self-pay | Admitting: Internal Medicine

## 2020-11-11 NOTE — Progress Notes (Signed)
°  Chronic Care Management   Note  11/11/2020 Name: Janet Steele MRN: [id number] DOB: 1929/09/23  Janet Steele is a 86 y.o. year old female who is a primary care patient of Burns, Claudina Lick, MD. I reached out to Rob Bunting by phone today in response to a referral sent by Janet Steele's PCP, Binnie Rail, MD.   Ms. Sadiq was given information about Chronic Care Management services today including:  1. CCM service includes personalized support from designated clinical staff supervised by her physician, including individualized plan of care and coordination with other care providers 2. 24/7 contact phone numbers for assistance for urgent and routine care needs. 3. Service will only be billed when office clinical staff spend 20 minutes or more in a month to coordinate care. 4. Only one practitioner may furnish and bill the service in a calendar month. 5. The patient may stop CCM services at any time (effective at the end of the month) by phone call to the office staff.   Patient wishes to consider information provided and/or speak with a member of the care team before deciding about enrollment in care management services.   Follow up plan:   Carley Perdue [employer] Scheduler

## 2020-11-17 DIAGNOSIS — Z23 Encounter for immunization: Secondary | ICD-10-CM | POA: Diagnosis not present

## 2020-11-18 ENCOUNTER — Ambulatory Visit: Payer: Medicare Other | Admitting: Physician Assistant

## 2020-11-20 ENCOUNTER — Telehealth: Payer: Self-pay | Admitting: Internal Medicine

## 2020-11-20 NOTE — Progress Notes (Signed)
  Chronic Care Management   Outreach Note  11/20/2020 Name: Janet Steele MRN: [id number] DOB: 1929/11/09  Referred by: Binnie Rail, MD Reason for referral : No chief complaint on file.   A second unsuccessful telephone outreach was attempted today. The patient was referred to pharmacist for assistance with care management and care coordination.  Follow Up Plan:   Carley Perdue [employer] Scheduler

## 2020-11-21 ENCOUNTER — Telehealth: Payer: Self-pay | Admitting: Internal Medicine

## 2020-11-21 NOTE — Progress Notes (Signed)
  Chronic Care Management   Note  11/21/2020 Name: Janet Steele MRN: [id number] DOB: Jun 17, 1929  Janet Steele is a 86 y.o. year old female who is a primary care patient of Burns, Claudina Lick, MD. I reached out to Rob Bunting by phone today in response to a referral sent by Janet Steele's PCP, Binnie Rail, MD.   Janet Steele was given information about Chronic Care Management services today including:  1. CCM service includes personalized support from designated clinical staff supervised by her physician, including individualized plan of care and coordination with other care providers 2. 24/7 contact phone numbers for assistance for urgent and routine care needs. 3. Service will only be billed when office clinical staff spend 20 minutes or more in a month to coordinate care. 4. Only one practitioner may furnish and bill the service in a calendar month. 5. The patient may stop CCM services at any time (effective at the end of the month) by phone call to the office staff.   Patient wishes to consider information provided and/or speak with a member of the care team before deciding about enrollment in care management services.   Follow up plan:   Carley Perdue [employer] Scheduler

## 2020-11-24 ENCOUNTER — Ambulatory Visit (INDEPENDENT_AMBULATORY_CARE_PROVIDER_SITE_OTHER): Payer: Medicare Other | Admitting: Nurse Practitioner

## 2020-11-24 ENCOUNTER — Other Ambulatory Visit: Payer: Self-pay

## 2020-11-24 ENCOUNTER — Encounter: Payer: Self-pay | Admitting: Nurse Practitioner

## 2020-11-24 VITALS — BP 100/60 | HR 63 | Ht 60.0 in | Wt 117.0 lb

## 2020-11-24 DIAGNOSIS — I200 Unstable angina: Secondary | ICD-10-CM | POA: Diagnosis not present

## 2020-11-24 DIAGNOSIS — I25119 Atherosclerotic heart disease of native coronary artery with unspecified angina pectoris: Secondary | ICD-10-CM | POA: Diagnosis not present

## 2020-11-24 DIAGNOSIS — I10 Essential (primary) hypertension: Secondary | ICD-10-CM

## 2020-11-24 DIAGNOSIS — E782 Mixed hyperlipidemia: Secondary | ICD-10-CM

## 2020-11-24 NOTE — Patient Instructions (Addendum)
After Visit Summary:  We will be checking the following labs today - NONE   Medication Instructions:    Continue with your current medicines.    If you need a refill on your cardiac medications before your next appointment, please call your pharmacy.     Testing/Procedures To Be Arranged:  N/A  Follow-Up:   See Dr. Burt Knack in about a year. You will receive a reminder letter in the mail two months in advance. If you don't receive a letter, please call our office to schedule the follow-up appointment.     At [facility], you and your health needs are our priority.  As part of our continuing mission to provide you with exceptional heart care, we have created designated Provider Care Teams.  These Care Teams include your primary Cardiologist (physician) and Advanced Practice Providers (APPs -  Physician Assistants and Nurse Practitioners) who all work together to provide you with the care you need, when you need it.  Special Instructions:  . Stay safe, wash your hands for at least 20 seconds and wear a mask when needed.  . It was good to talk with you today.    Call the [place] office at [phone] if you have any questions, problems or concerns.

## 2020-12-15 NOTE — Progress Notes (Signed)
Subjective:    Patient ID: Janet Steele, female    DOB: [birth year], 87 y.o.   MRN: [id number]  HPI The patient is here for an acute visit.   Both ears are bothering her.  She has dementia and is a poor historian.  She thinks they hurt. She is unsure when it started   No pain while eating.      Medications and allergies reviewed with patient and updated if appropriate.  Patient Active Problem List   Diagnosis Date Noted  . Ear pain, bilateral 12/16/2020  . Aortic atherosclerosis ([place]) 11/03/2020  . CKD (chronic kidney disease) stage 3, GFR 30-59 ml/min (HCC) 11/02/2020  . Urinary frequency 10/17/2020  . Poor balance 04/29/2020  . Physical deconditioning 04/29/2020  . Cold extremities 02/19/2020  . Colitis 01/15/2020  . Headache 07/21/2019  . Lower abdominal pain 02/05/2019  . Other headache syndrome 08/14/2018  . Depression 08/14/2018  . Stroke ([place]) 11/04/2017  . Weakness 11/03/2017  . History of cerebrovascular accident (CVA) with residual deficit 11/03/2017  . Chronic back pain 07/04/2017  . PAD (peripheral artery disease) ([place]) 01/17/2017  . Overactive bladder 12/17/2016  . Anemia 05/20/2016  . Coronary artery disease involving coronary bypass graft of native heart without angina pectoris 05/19/2016  . Numbness and tingling of foot 12/31/2015  . MCI (mild cognitive impairment) with memory loss 11/18/2015  . Facial tic 05/07/2015  . Sick sinus syndrome ([place]) 02/26/2015  . Cervical dystonia 12/04/2014  . Chronic motor tic 09/12/2014  . Pacemaker-Medtronic 07/07/2012  . GERD (gastroesophageal reflux disease) 07/05/2012  . Cough 08/04/2010  . Abnormal involuntary movement 12/17/2009  . Disturbance in sleep behavior 12/11/2009  . CAD, ARTERY BYPASS GRAFT 09/24/2009  . CAROTID BRUIT 09/24/2009  . Diabetes ([place]) 01/30/2009  . Anxiety 02/14/2008  . EROSIVE ESOPHAGITIS 02/14/2008  . CONSTIPATION, CHRONIC 02/14/2008  . DEGENERATIVE JOINT DISEASE 02/14/2008  .  HEARING LOSS 01/04/2008  . PVD 01/04/2008  . Hypothyroidism 09/25/2007  . HYPERLIPIDEMIA 09/25/2007  . Essential hypertension 09/07/2007  . Diabetic polyneuropathy ([place]) 03/16/2007  . Stricture and stenosis of esophagus 03/16/2007  . HIATAL HERNIA 02/16/2007  . HEMORRHOIDS 12/31/1997  . DIVERTICULOSIS, COLON 12/31/1997    Current Outpatient Medications on File Prior to Visit  Medication Sig Dispense Refill  . atorvastatin (LIPITOR) 80 MG tablet TAKE 1 TABLET(80 MG) BY MOUTH DAILY. FOLLOW-UP APPOINTMENT WITH LABS DUE IN NOVEMBER 90 tablet 1  . clopidogrel (PLAVIX) 75 MG tablet Take 1 tablet (75 mg total) by mouth daily. 90 tablet 2  . DULoxetine (CYMBALTA) 60 MG capsule TAKE 1 CAPSULE(60 MG) BY MOUTH DAILY 90 capsule 1  . gabapentin (NEURONTIN) 100 MG capsule Take 2 capsules (200 mg total) by mouth 2 (two) times daily. 360 capsule 1  . levothyroxine (SYNTHROID) 100 MCG tablet Take 1 tablet (100 mcg total) by mouth daily before breakfast. 90 tablet 1  . loratadine (CLARITIN) 10 MG tablet Take 1 tablet (10 mg total) by mouth daily. 30 tablet 11  . losartan (COZAAR) 25 MG tablet TAKE 1 TABLET(25 MG) BY MOUTH DAILY 90 tablet 1  . metFORMIN (GLUCOPHAGE) 1000 MG tablet TAKE 1 TABLET BY MOUTH TWICE DAILY WITH MEALS 180 tablet 1  . metoprolol tartrate (LOPRESSOR) 25 MG tablet Take 1 tablet (25 mg total) by mouth 2 (two) times daily. 180 tablet 1  . nitroGLYCERIN (NITROSTAT) 0.4 MG SL tablet Place 1 tablet (0.4 mg total) under the tongue every 5 (five) minutes as needed. For chest pain 25  tablet 5  . pantoprazole (PROTONIX) 40 MG tablet Take 1 tablet (40 mg total) by mouth daily. 90 tablet 1   No current facility-administered medications on file prior to visit.    Past Medical History:  Diagnosis Date  . Anemia   . Anxiety   . Arthritis    "fingers" (05/19/2016  . CAD (coronary artery disease)    a. s/p CABG 1982 b. redo CABG 2003. c. [place] despite med rx 05/2016: successful but complicated PCI  of PDA beyond graft site, c/b localized dissection requiring overlapping stent.  . Carotid bruit    a. Duplex 01/2015: patent vessels, 1-39% BICA, f/u PRN recommened.  . Colon polyp    adenomatous  . Colon, diverticulosis   . Degenerative joint disease   . Diastolic dysfunction   . Esophageal stricture   . GERD (gastroesophageal reflux disease)   . Hiatal hernia   . History of blood transfusion    "related to my bypass"  . Hyperlipidemia   . Hypertension   . Hypothyroidism   . Ischemic cardiomyopathy    a. Cath 05/2016: EF 40% with severe inferior wall HK, suspect hibernating myocardium.  . Myocardial infarction ([place]) 1977; 1982  . Osteoarthrosis, unspecified whether generalized or localized, unspecified site   . Presence of permanent cardiac pacemaker   . PVD (peripheral vascular disease) ([place])   . Stroke [facility])    greater than 2 years ago per husband  . Type II diabetes mellitus ([place])   . Unspecified hearing loss     Past Surgical History:  Procedure Laterality Date  . ABDOMINAL HYSTERECTOMY    . APPENDECTOMY    . BACK SURGERY    . CARDIAC CATHETERIZATION N/A 05/19/2016   Procedure: Left Heart Cath and Cors/Grafts Angiography;  Surgeon: Belva Crome, MD;  Location: [place] CV LAB;  Service: Cardiovascular;  Laterality: N/A;  . CARDIAC CATHETERIZATION N/A 05/19/2016   Procedure: Coronary Stent Intervention;  Surgeon: Belva Crome, MD;  Location: [place] CV LAB;  Service: Cardiovascular;  Laterality: N/A;  . CATARACT EXTRACTION W/ INTRAOCULAR LENS  IMPLANT, BILATERAL Bilateral   . CHOLECYSTECTOMY OPEN    . COLONOSCOPY    . CORONARY ANGIOPLASTY WITH STENT PLACEMENT  05/19/2016   "2 stents?"  . CORONARY ARTERY BYPASS GRAFT  1982; 2003   x4(1982),02-2002 CABG X2  . ESOPHAGOGASTRODUODENOSCOPY (EGD) WITH ESOPHAGEAL DILATION  "2-3 times"  . INSERT / REPLACE / REMOVE PACEMAKER    . LEFT HEART CATHETERIZATION WITH CORONARY/GRAFT ANGIOGRAM N/A 03/05/2014   Procedure: LEFT  HEART CATHETERIZATION WITH Beatrix Fetters;  Surgeon: Sinclair Grooms, MD;  Location: [facility] CATH LAB;  Service: Cardiovascular;  Laterality: N/A;  . LUMBAR [place] SURGERY  01-2000  . OOPHORECTOMY Bilateral   . PERMANENT PACEMAKER INSERTION N/A 07/06/2012   MDT Adapta L implanted by Dr Rayann Heman for symptomatic bradycardia  . TONSILLECTOMY  1930s  . UPPER GASTROINTESTINAL ENDOSCOPY      Social History   Socioeconomic History  . Marital status: Married    Spouse name: [name]  . Number of children: 3  . Years of education: 67  . Highest education level: Not on file  Occupational History  . Occupation: Retired     Fish farm manager: RETIRED  Tobacco Use  . Smoking status: Former Smoker    Packs/day: 0.50    Years: 30.00    Pack years: 15.00    Types: Cigarettes    Quit date: 07/05/1976    Years since quitting: 44.4  .  Smokeless tobacco: Never Used  Vaping Use  . Vaping Use: Never used  Substance and Sexual Activity  . Alcohol use: No    Alcohol/week: 0.0 standard drinks    Comment: "not anymore, I haven't in I don't know years"  . Drug use: No  . Sexual activity: Never  Other Topics Concern  . Not on file  Social History Narrative   Patient is married [name]) and lives at home with her husband.   Patient has three adult children.   Patient is retired.   Patient has a college education.   Patient is right-handed.   Patient does not drink any caffeine.   Social Determinants of Health   Financial Resource Strain: Low Risk   . Difficulty of Paying Living Expenses: Not hard at all  Food Insecurity: No Food Insecurity  . Worried About Charity fundraiser in the Last Year: Never true  . Ran Out of Food in the Last Year: Never true  Transportation Needs: No Transportation Needs  . Lack of Transportation (Medical): No  . Lack of Transportation (Non-Medical): No  Physical Activity: Unknown  . Days of Exercise per Week: Patient refused  . Minutes of Exercise per Session: Not on  file  Stress: No Stress Concern Present  . Feeling of Stress : Not at all  Social Connections: Unknown  . Frequency of Communication with Friends and Family: More than three times a week  . Frequency of Social Gatherings with Friends and Family: More than three times a week  . Attends Religious Services: Patient refused  . Active Member of Clubs or Organizations: Patient refused  . Attends Archivist Meetings: Patient refused  . Marital Status: Married    Family History  Problem Relation Age of Onset  . Heart disease Sister   . Heart attack Sister   . Colon cancer Neg Hx   . Breast cancer Neg Hx   . Celiac disease Neg Hx   . Cirrhosis Neg Hx   . Clotting disorder Neg Hx   . Colitis Neg Hx   . Colon polyps Neg Hx   . Crohn's disease Neg Hx   . Cystic fibrosis Neg Hx   . Diabetes Neg Hx   . Esophageal cancer Neg Hx   . Hemochromatosis Neg Hx   . Inflammatory bowel disease Neg Hx   . Irritable bowel syndrome Neg Hx   . Kidney disease Neg Hx   . Liver cancer Neg Hx   . Liver disease Neg Hx   . Ovarian cancer Neg Hx   . Pancreatic cancer Neg Hx   . Prostate cancer Neg Hx   . Rectal cancer Neg Hx   . Stomach cancer Neg Hx   . Ulcerative colitis Neg Hx   . Uterine cancer Neg Hx   . Wilson's disease Neg Hx     Review of Systems  Constitutional: Negative for fever.  HENT: Positive for ear pain. Negative for congestion, rhinorrhea and sinus pain.   Respiratory: Positive for cough (Chronic, unchanged). Negative for shortness of breath and wheezing.        Objective:   Vitals:   12/16/20 1029  BP: 104/70  Pulse: 60  Resp: 17  Temp: 98.1 F (36.7 C)   BP Readings from Last 3 Encounters:  12/16/20 104/70  11/24/20 100/60  11/03/20 120/70   Wt Readings from Last 3 Encounters:  12/16/20 116 lb 12.8 oz (53 kg)  11/24/20 117 lb (53.1 kg)  11/03/20 114  lb 12.8 oz (52.1 kg)   Body mass index is 22.81 kg/m.   Physical Exam Constitutional:      General:  She is not in acute distress.    Appearance: Normal appearance. She is not ill-appearing.  HENT:     Head: Normocephalic and atraumatic.     Right Ear: Tympanic membrane, ear canal and external ear normal. There is no impacted cerumen.     Left Ear: Tympanic membrane, ear canal and external ear normal. There is no impacted cerumen.     Nose: Nose normal.     Mouth/Throat:     Mouth: Mucous membranes are moist.     Pharynx: No oropharyngeal exudate or posterior oropharyngeal erythema.  Cardiovascular:     Rate and Rhythm: Normal rate and regular rhythm.  Pulmonary:     Effort: Pulmonary effort is normal.     Breath sounds: Normal breath sounds.  Musculoskeletal:     Cervical back: No tenderness.  Lymphadenopathy:     Cervical: No cervical adenopathy.  Skin:    General: Skin is warm and dry.            Assessment & Plan:    See Problem List for Assessment and Plan of chronic medical problems.    This visit occurred during the SARS-CoV-2 public health emergency.  Safety protocols were in place, including screening questions prior to the visit, additional usage of staff PPE, and extensive cleaning of exam room while observing appropriate contact time as indicated for disinfecting solutions.

## 2020-12-16 ENCOUNTER — Other Ambulatory Visit: Payer: Self-pay

## 2020-12-16 ENCOUNTER — Encounter: Payer: Self-pay | Admitting: Internal Medicine

## 2020-12-16 ENCOUNTER — Ambulatory Visit (INDEPENDENT_AMBULATORY_CARE_PROVIDER_SITE_OTHER): Payer: Medicare Other | Admitting: Internal Medicine

## 2020-12-16 VITALS — BP 104/70 | HR 60 | Temp 98.1°F | Resp 17 | Ht 60.0 in | Wt 116.8 lb

## 2020-12-16 DIAGNOSIS — H9203 Otalgia, bilateral: Secondary | ICD-10-CM | POA: Insufficient documentation

## 2020-12-16 DIAGNOSIS — E039 Hypothyroidism, unspecified: Secondary | ICD-10-CM

## 2020-12-16 LAB — TSH: TSH: 50.83 u[IU]/mL — ABNORMAL HIGH (ref 0.35–4.50)

## 2020-12-16 NOTE — Assessment & Plan Note (Signed)
Acute Exam is normal Because of her dementia she is a poor historian so it is difficult to get any further information Possible causes of ear pain discussed with her-allergies/eustachian tube dysfunction or TMJ Advised her to take her Claritin daily Can refer to ENT if ear pain persists

## 2020-12-16 NOTE — Patient Instructions (Addendum)
Your ears looks good - there is no wax or redness.   The pain may be caused by allergies or jaw joint pain ( while would be while eating mostly).  Make sure you are taking the Claritin daily, which may help.     If your pain does not go away we can have you see an ENT.     We need to recheck your thyroid level - have blood work downstairs.      Follow up in 3 months for routine follow up

## 2020-12-16 NOTE — Assessment & Plan Note (Signed)
Chronic Last TSH was 2 months ago and was very elevated She is not taking her medication on a daily basis-stressed that she needs to be taking her medication daily.  I have discussed this with her husband that he needs to observe her taking medication on a daily basis Continue levothyroxine 100 mcg daily Check TSH today-we will adjust medication if needed

## 2020-12-17 DIAGNOSIS — H353213 Exudative age-related macular degeneration, right eye, with inactive scar: Secondary | ICD-10-CM | POA: Diagnosis not present

## 2020-12-17 MED ORDER — LEVOTHYROXINE SODIUM 125 MCG PO TABS: 125.0000 ug | ORAL_TABLET | Freq: Every day | ORAL | 3 refills | Status: DC

## 2020-12-17 NOTE — Addendum Note (Signed)
Addended by: Binnie Rail on: 12/17/2020 10:23 AM   Modules accepted: Orders

## 2020-12-22 DIAGNOSIS — R079 Chest pain, unspecified: Secondary | ICD-10-CM | POA: Diagnosis not present

## 2020-12-22 DIAGNOSIS — R0789 Other chest pain: Secondary | ICD-10-CM | POA: Diagnosis not present

## 2020-12-22 DIAGNOSIS — R110 Nausea: Secondary | ICD-10-CM | POA: Diagnosis not present

## 2020-12-25 ENCOUNTER — Telehealth: Payer: Self-pay | Admitting: Internal Medicine

## 2020-12-25 NOTE — Progress Notes (Signed)
  Chronic Care Management   Note  12/25/2020 Name: [name] MRN: [id number] DOB: 1929-12-04  Janet Steele is a 87 y.o. year old female who is a primary care patient of Burns, Claudina Lick, MD. I reached out to Rob Bunting by phone today in response to a referral sent by Janet Steele PCP, Binnie Rail, MD.   Janet Steele was given information about Chronic Care Management services today including:  1. CCM service includes personalized support from designated clinical staff supervised by her physician, including individualized plan of care and coordination with other care providers 2. 24/7 contact phone numbers for assistance for urgent and routine care needs. 3. Service will only be billed when office clinical staff spend 20 minutes or more in a month to coordinate care. 4. Only one practitioner may furnish and bill the service in a calendar month. 5. The patient may stop CCM services at any time (effective at the end of the month) by phone call to the office staff.   Patient wishes to consider information provided and/or speak with a member of the care team before deciding about enrollment in care management services.   Follow up plan:   Carley Perdue [employer] Scheduler

## 2021-01-02 ENCOUNTER — Encounter: Payer: Self-pay | Admitting: Internal Medicine

## 2021-01-02 NOTE — Progress Notes (Signed)
Outside notes received. Information abstracted. Notes sent to scan.  

## 2021-01-08 DIAGNOSIS — H52203 Unspecified astigmatism, bilateral: Secondary | ICD-10-CM | POA: Diagnosis not present

## 2021-01-08 DIAGNOSIS — Z961 Presence of intraocular lens: Secondary | ICD-10-CM | POA: Diagnosis not present

## 2021-01-08 DIAGNOSIS — E119 Type 2 diabetes mellitus without complications: Secondary | ICD-10-CM | POA: Diagnosis not present

## 2021-01-08 DIAGNOSIS — H353132 Nonexudative age-related macular degeneration, bilateral, intermediate dry stage: Secondary | ICD-10-CM | POA: Diagnosis not present

## 2021-01-08 LAB — HM DIABETES EYE EXAM

## 2021-01-22 ENCOUNTER — Encounter: Payer: Self-pay | Admitting: Internal Medicine

## 2021-01-22 NOTE — Progress Notes (Signed)
Outside notes received. Information abstracted. Notes sent to scan.  

## 2021-01-23 ENCOUNTER — Telehealth: Payer: Self-pay | Admitting: Internal Medicine

## 2021-01-23 NOTE — Progress Notes (Signed)
  Chronic Care Management   Outreach Note  01/23/2021 Name: Janet Steele MRN: [id number] DOB: [birth year]  Referred by: Binnie Rail, MD Reason for referral : No chief complaint on file.   Third unsuccessful telephone outreach was attempted today. The patient was referred to the pharmacist for assistance with care management and care coordination.   Follow Up Plan:   Carley Perdue [employer] Scheduler

## 2021-01-27 ENCOUNTER — Telehealth: Payer: Self-pay | Admitting: Internal Medicine

## 2021-01-27 NOTE — Progress Notes (Signed)
  Chronic Care Management   Note  01/27/2021 Name: KENORA SPAYD MRN: [id number] DOB: 1929/09/29  YAREL KILCREASE is a 87 y.o. year old female who is a primary care patient of Burns, Claudina Lick, MD. I reached out to Rob Bunting by phone today in response to a referral sent by Ms. Gevena Barre Paladino's PCP, Binnie Rail, MD.   Ms. Lenart was given information about Chronic Care Management services today including:  1. CCM service includes personalized support from designated clinical staff supervised by her physician, including individualized plan of care and coordination with other care providers 2. 24/7 contact phone numbers for assistance for urgent and routine care needs. 3. Service will only be billed when office clinical staff spend 20 minutes or more in a month to coordinate care. 4. Only one practitioner may furnish and bill the service in a calendar month. 5. The patient may stop CCM services at any time (effective at the end of the month) by phone call to the office staff.   Patient agreed to services and verbal consent obtained.   Follow up plan:   Carley Perdue [employer] Scheduler

## 2021-02-20 ENCOUNTER — Telehealth (INDEPENDENT_AMBULATORY_CARE_PROVIDER_SITE_OTHER): Payer: Medicare Other | Admitting: Family

## 2021-02-20 ENCOUNTER — Other Ambulatory Visit: Payer: Self-pay

## 2021-02-20 DIAGNOSIS — R5383 Other fatigue: Secondary | ICD-10-CM | POA: Diagnosis not present

## 2021-02-20 DIAGNOSIS — R1111 Vomiting without nausea: Secondary | ICD-10-CM | POA: Diagnosis not present

## 2021-02-20 NOTE — Progress Notes (Signed)
Janet Steele is a 87 y.o. female with the following history as recorded in EpicCare:  Patient Active Problem List   Diagnosis Date Noted  . Ear pain, bilateral 12/16/2020  . Aortic atherosclerosis ([place]) 11/03/2020  . CKD (chronic kidney disease) stage 3, GFR 30-59 ml/min (HCC) 11/02/2020  . Urinary frequency 10/17/2020  . Poor balance 04/29/2020  . Physical deconditioning 04/29/2020  . Cold extremities 02/19/2020  . Colitis 01/15/2020  . Headache 07/21/2019  . Lower abdominal pain 02/05/2019  . Other headache syndrome 08/14/2018  . Depression 08/14/2018  . Stroke ([place]) 11/04/2017  . Weakness 11/03/2017  . History of cerebrovascular accident (CVA) with residual deficit 11/03/2017  . Chronic back pain 07/04/2017  . PAD (peripheral artery disease) ([place]) 01/17/2017  . Overactive bladder 12/17/2016  . Anemia 05/20/2016  . Coronary artery disease involving coronary bypass graft of native heart without angina pectoris 05/19/2016  . Numbness and tingling of foot 12/31/2015  . MCI (mild cognitive impairment) with memory loss 11/18/2015  . Facial tic 05/07/2015  . Sick sinus syndrome ([place]) 02/26/2015  . Cervical dystonia 12/04/2014  . Chronic motor tic 09/12/2014  . Pacemaker-Medtronic 07/07/2012  . GERD (gastroesophageal reflux disease) 07/05/2012  . Cough 08/04/2010  . Abnormal involuntary movement 12/17/2009  . Disturbance in sleep behavior 12/11/2009  . CAD, ARTERY BYPASS GRAFT 09/24/2009  . CAROTID BRUIT 09/24/2009  . Diabetes ([place]) 01/30/2009  . Anxiety 02/14/2008  . EROSIVE ESOPHAGITIS 02/14/2008  . CONSTIPATION, CHRONIC 02/14/2008  . DEGENERATIVE JOINT DISEASE 02/14/2008  . HEARING LOSS 01/04/2008  . PVD 01/04/2008  . Hypothyroidism 09/25/2007  . HYPERLIPIDEMIA 09/25/2007  . Essential hypertension 09/07/2007  . Diabetic polyneuropathy ([place]) 03/16/2007  . Stricture and stenosis of esophagus 03/16/2007  . HIATAL HERNIA 02/16/2007  . HEMORRHOIDS 12/31/1997  .  DIVERTICULOSIS, COLON 12/31/1997    Current Outpatient Medications  Medication Sig Dispense Refill  . atorvastatin (LIPITOR) 80 MG tablet TAKE 1 TABLET(80 MG) BY MOUTH DAILY. FOLLOW-UP APPOINTMENT WITH LABS DUE IN NOVEMBER 90 tablet 1  . clopidogrel (PLAVIX) 75 MG tablet Take 1 tablet (75 mg total) by mouth daily. 90 tablet 2  . DULoxetine (CYMBALTA) 60 MG capsule TAKE 1 CAPSULE(60 MG) BY MOUTH DAILY 90 capsule 1  . gabapentin (NEURONTIN) 100 MG capsule Take 2 capsules (200 mg total) by mouth 2 (two) times daily. 360 capsule 1  . levothyroxine (SYNTHROID) 125 MCG tablet Take 1 tablet (125 mcg total) by mouth daily. 90 tablet 3  . loratadine (CLARITIN) 10 MG tablet Take 1 tablet (10 mg total) by mouth daily. 30 tablet 11  . losartan (COZAAR) 25 MG tablet TAKE 1 TABLET(25 MG) BY MOUTH DAILY 90 tablet 1  . metFORMIN (GLUCOPHAGE) 1000 MG tablet TAKE 1 TABLET BY MOUTH TWICE DAILY WITH MEALS 180 tablet 1  . metoprolol tartrate (LOPRESSOR) 25 MG tablet Take 1 tablet (25 mg total) by mouth 2 (two) times daily. 180 tablet 1  . nitroGLYCERIN (NITROSTAT) 0.4 MG SL tablet Place 1 tablet (0.4 mg total) under the tongue every 5 (five) minutes as needed. For chest pain 25 tablet 5  . pantoprazole (PROTONIX) 40 MG tablet Take 1 tablet (40 mg total) by mouth daily. 90 tablet 1   No current facility-administered medications for this visit.    Allergies: Morphine, Penicillins, Prednisone, Sulfonamide derivatives, Codeine, Hydrocodone-acetaminophen, Meperidine hcl, Metoclopramide hcl, Metoprolol succinate, Moxifloxacin, Oxycodone-aspirin, Pentazocine lactate, and Pioglitazone  Past Medical History:  Diagnosis Date  . Anemia   . Anxiety   . Arthritis    "  fingers" (05/19/2016  . CAD (coronary artery disease)    a. s/p CABG 1982 b. redo CABG 2003. c. [place] despite med rx 05/2016: successful but complicated PCI of PDA beyond graft site, c/b localized dissection requiring overlapping stent.  . Carotid bruit    a.  Duplex 01/2015: patent vessels, 1-39% BICA, f/u PRN recommened.  . Colon polyp    adenomatous  . Colon, diverticulosis   . Degenerative joint disease   . Diastolic dysfunction   . Esophageal stricture   . GERD (gastroesophageal reflux disease)   . Hiatal hernia   . History of blood transfusion    "related to my bypass"  . Hyperlipidemia   . Hypertension   . Hypothyroidism   . Ischemic cardiomyopathy    a. Cath 05/2016: EF 40% with severe inferior wall HK, suspect hibernating myocardium.  . Myocardial infarction ([place]) 1977; 1982  . Osteoarthrosis, unspecified whether generalized or localized, unspecified site   . Presence of permanent cardiac pacemaker   . PVD (peripheral vascular disease) ([place])   . Stroke [facility])    greater than 2 years ago per husband  . Type II diabetes mellitus ([place])   . Unspecified hearing loss     Past Surgical History:  Procedure Laterality Date  . ABDOMINAL HYSTERECTOMY    . APPENDECTOMY    . BACK SURGERY    . CARDIAC CATHETERIZATION N/A 05/19/2016   Procedure: Left Heart Cath and Cors/Grafts Angiography;  Surgeon: Belva Crome, MD;  Location: [place] CV LAB;  Service: Cardiovascular;  Laterality: N/A;  . CARDIAC CATHETERIZATION N/A 05/19/2016   Procedure: Coronary Stent Intervention;  Surgeon: Belva Crome, MD;  Location: [place] CV LAB;  Service: Cardiovascular;  Laterality: N/A;  . CATARACT EXTRACTION W/ INTRAOCULAR LENS  IMPLANT, BILATERAL Bilateral   . CHOLECYSTECTOMY OPEN    . COLONOSCOPY    . CORONARY ANGIOPLASTY WITH STENT PLACEMENT  05/19/2016   "2 stents?"  . CORONARY ARTERY BYPASS GRAFT  1982; 2003   x4(1982),02-2002 CABG X2  . ESOPHAGOGASTRODUODENOSCOPY (EGD) WITH ESOPHAGEAL DILATION  "2-3 times"  . INSERT / REPLACE / REMOVE PACEMAKER    . LEFT HEART CATHETERIZATION WITH CORONARY/GRAFT ANGIOGRAM N/A 03/05/2014   Procedure: LEFT HEART CATHETERIZATION WITH Beatrix Fetters;  Surgeon: Sinclair Grooms, MD;  Location: [facility] CATH LAB;   Service: Cardiovascular;  Laterality: N/A;  . LUMBAR [place] SURGERY  01-2000  . OOPHORECTOMY Bilateral   . PERMANENT PACEMAKER INSERTION N/A 07/06/2012   MDT Adapta L implanted by Dr Rayann Heman for symptomatic bradycardia  . TONSILLECTOMY  1930s  . UPPER GASTROINTESTINAL ENDOSCOPY      Family History  Problem Relation Age of Onset  . Heart disease Sister   . Heart attack Sister   . Colon cancer Neg Hx   . Breast cancer Neg Hx   . Celiac disease Neg Hx   . Cirrhosis Neg Hx   . Clotting disorder Neg Hx   . Colitis Neg Hx   . Colon polyps Neg Hx   . Crohn's disease Neg Hx   . Cystic fibrosis Neg Hx   . Diabetes Neg Hx   . Esophageal cancer Neg Hx   . Hemochromatosis Neg Hx   . Inflammatory bowel disease Neg Hx   . Irritable bowel syndrome Neg Hx   . Kidney disease Neg Hx   . Liver cancer Neg Hx   . Liver disease Neg Hx   . Ovarian cancer Neg Hx   . Pancreatic cancer Neg Hx   .  Prostate cancer Neg Hx   . Rectal cancer Neg Hx   . Stomach cancer Neg Hx   . Ulcerative colitis Neg Hx   . Uterine cancer Neg Hx   . Wilson's disease Neg Hx     Social History   Tobacco Use  . Smoking status: Former Smoker    Packs/day: 0.50    Years: 30.00    Pack years: 15.00    Types: Cigarettes    Quit date: 07/05/1976    Years since quitting: 44.6  . Smokeless tobacco: Never Used  Substance Use Topics  . Alcohol use: No    Alcohol/week: 0.0 standard drinks    Comment: "not anymore, I haven't in I don't know years"    Subjective:   I connected with Janet Steele on 02/20/21 at  3:00 PM EDT by a telephone call and verified that I am speaking with the correct person using two identifiers.   I discussed the limitations of evaluation and management by telemedicine and the availability of in person appointments. The patient expressed understanding and agreed to proceed. Provider in office/ patient is at home; provider, patient's husband and patient are only 3 people on telephone call.   Husband  provides entire history; patient does not speak on call but per husband is present; Apparently, patient had some episodes of vomiting last week- per husband, she has been fine since Monday of this week; able to eat normally; per husband she is "just weak" but this is "normal." Husband readily admits that patient is not taking her medications as prescribed;     Objective:  There were no vitals filed for this visit.   Lungs: Respirations unlabored;   Assessment:  1. Other fatigue   2. Vomiting without nausea, intractability of vomiting not specified, unspecified vomiting type     Plan:  1. Non compliant with medications; discussed how missing her thyroid and Duloxetine could contribute to symptoms; per husband, she has been eating normally this week; She is due for OV with her PCP and will bring her for in person appointment next week;  Time spent 12 minutes  No follow-ups on file.  No orders of the defined types were placed in this encounter.   Requested Prescriptions    No prescriptions requested or ordered in this encounter

## 2021-02-23 NOTE — Patient Instructions (Addendum)
  Blood work was ordered.     Medications changes include :   Try stopping the gabapentin to see if she needs it.    A referral was ordered for home physical therapy.       Someone from their office will call you to schedule an appointment.

## 2021-02-23 NOTE — Progress Notes (Signed)
Subjective:    Patient ID: Janet Steele, female    DOB: Nov 21, 1929, 87 y.o.   MRN: [id number]  HPI The patient is here for follow up from her 3/18 visit with Mickel Baas.  She had vomiting the week prior to her seeing Mickel Baas- it lasted 5 days.  Her husband thinks it was a GI bug.  She has not had vomiting since then. She is now eating normally, but is weak, which was normal, but is it worse.  She can usually walk with her walker, but she can not do that now.    Her husband states she was not taking her medication as prescribed - this is not new.  He states he often finds her pills on the floor and on the bed.  She denies vomiting this week.  She still feels weak - her legs feel weak.     Medications and allergies reviewed with patient and updated if appropriate.  Patient Active Problem List   Diagnosis Date Noted  . Ear pain, bilateral 12/16/2020  . Aortic atherosclerosis ([place]) 11/03/2020  . CKD (chronic kidney disease) stage 3, GFR 30-59 ml/min (HCC) 11/02/2020  . Urinary frequency 10/17/2020  . Poor balance 04/29/2020  . Physical deconditioning 04/29/2020  . Cold extremities 02/19/2020  . Colitis 01/15/2020  . Headache 07/21/2019  . Lower abdominal pain 02/05/2019  . Other headache syndrome 08/14/2018  . Depression 08/14/2018  . Stroke ([place]) 11/04/2017  . Weakness 11/03/2017  . History of cerebrovascular accident (CVA) with residual deficit 11/03/2017  . Chronic back pain 07/04/2017  . PAD (peripheral artery disease) ([place]) 01/17/2017  . Overactive bladder 12/17/2016  . Anemia 05/20/2016  . Coronary artery disease involving coronary bypass graft of native heart without angina pectoris 05/19/2016  . MCI (mild cognitive impairment) with memory loss 11/18/2015  . Facial tic 05/07/2015  . Sick sinus syndrome ([place]) 02/26/2015  . Cervical dystonia 12/04/2014  . Pacemaker-Medtronic 07/07/2012  . GERD (gastroesophageal reflux disease) 07/05/2012  . Cough 08/04/2010  . Abnormal  involuntary movement 12/17/2009  . Disturbance in sleep behavior 12/11/2009  . CAD, ARTERY BYPASS GRAFT 09/24/2009  . CAROTID BRUIT 09/24/2009  . Diabetes ([place]) 01/30/2009  . Anxiety 02/14/2008  . EROSIVE ESOPHAGITIS 02/14/2008  . CONSTIPATION, CHRONIC 02/14/2008  . DEGENERATIVE JOINT DISEASE 02/14/2008  . HEARING LOSS 01/04/2008  . PVD 01/04/2008  . Hypothyroidism 09/25/2007  . HYPERLIPIDEMIA 09/25/2007  . Essential hypertension 09/07/2007  . Diabetic polyneuropathy ([place]) 03/16/2007  . Stricture and stenosis of esophagus 03/16/2007  . HIATAL HERNIA 02/16/2007  . HEMORRHOIDS 12/31/1997  . DIVERTICULOSIS, COLON 12/31/1997    Current Outpatient Medications on File Prior to Visit  Medication Sig Dispense Refill  . atorvastatin (LIPITOR) 80 MG tablet TAKE 1 TABLET(80 MG) BY MOUTH DAILY. FOLLOW-UP APPOINTMENT WITH LABS DUE IN NOVEMBER 90 tablet 1  . clopidogrel (PLAVIX) 75 MG tablet Take 1 tablet (75 mg total) by mouth daily. 90 tablet 2  . DULoxetine (CYMBALTA) 60 MG capsule TAKE 1 CAPSULE(60 MG) BY MOUTH DAILY 90 capsule 1  . gabapentin (NEURONTIN) 100 MG capsule Take 2 capsules (200 mg total) by mouth 2 (two) times daily. 360 capsule 1  . levothyroxine (SYNTHROID) 125 MCG tablet Take 1 tablet (125 mcg total) by mouth daily. 90 tablet 3  . loratadine (CLARITIN) 10 MG tablet Take 1 tablet (10 mg total) by mouth daily. 30 tablet 11  . losartan (COZAAR) 25 MG tablet TAKE 1 TABLET(25 MG) BY MOUTH DAILY 90 tablet 1  .  metFORMIN (GLUCOPHAGE) 1000 MG tablet TAKE 1 TABLET BY MOUTH TWICE DAILY WITH MEALS 180 tablet 1  . metoprolol tartrate (LOPRESSOR) 25 MG tablet Take 1 tablet (25 mg total) by mouth 2 (two) times daily. 180 tablet 1  . nitroGLYCERIN (NITROSTAT) 0.4 MG SL tablet Place 1 tablet (0.4 mg total) under the tongue every 5 (five) minutes as needed. For chest pain 25 tablet 5  . pantoprazole (PROTONIX) 40 MG tablet Take 1 tablet (40 mg total) by mouth daily. 90 tablet 1   No current  facility-administered medications on file prior to visit.    Past Medical History:  Diagnosis Date  . Anemia   . Anxiety   . Arthritis    "fingers" (05/19/2016  . CAD (coronary artery disease)    a. s/p CABG 1982 b. redo CABG 2003. c. [place] despite med rx 05/2016: successful but complicated PCI of PDA beyond graft site, c/b localized dissection requiring overlapping stent.  . Carotid bruit    a. Duplex 01/2015: patent vessels, 1-39% BICA, f/u PRN recommened.  . Colon polyp    adenomatous  . Colon, diverticulosis   . Degenerative joint disease   . Diastolic dysfunction   . Esophageal stricture   . GERD (gastroesophageal reflux disease)   . Hiatal hernia   . History of blood transfusion    "related to my bypass"  . Hyperlipidemia   . Hypertension   . Hypothyroidism   . Ischemic cardiomyopathy    a. Cath 05/2016: EF 40% with severe inferior wall HK, suspect hibernating myocardium.  . Myocardial infarction ([place]) 1977; 1982  . Osteoarthrosis, unspecified whether generalized or localized, unspecified site   . Presence of permanent cardiac pacemaker   . PVD (peripheral vascular disease) ([place])   . Stroke [facility])    greater than 2 years ago per husband  . Type II diabetes mellitus ([place])   . Unspecified hearing loss     Past Surgical History:  Procedure Laterality Date  . ABDOMINAL HYSTERECTOMY    . APPENDECTOMY    . BACK SURGERY    . CARDIAC CATHETERIZATION N/A 05/19/2016   Procedure: Left Heart Cath and Cors/Grafts Angiography;  Surgeon: Belva Crome, MD;  Location: [facility] CV LAB;  Service: Cardiovascular;  Laterality: N/A;  . CARDIAC CATHETERIZATION N/A 05/19/2016   Procedure: Coronary Stent Intervention;  Surgeon: Belva Crome, MD;  Location: [place] [facility];  Service: Cardiovascular;  Laterality: N/A;  . CATARACT EXTRACTION W/ INTRAOCULAR LENS  IMPLANT, BILATERAL Bilateral   . CHOLECYSTECTOMY OPEN    . COLONOSCOPY    . CORONARY ANGIOPLASTY WITH STENT PLACEMENT  05/19/2016    "2 stents?"  . CORONARY ARTERY BYPASS GRAFT  1982; 2003   x4(1982),02-2002 CABG X2  . ESOPHAGOGASTRODUODENOSCOPY (EGD) WITH ESOPHAGEAL DILATION  "2-3 times"  . INSERT / REPLACE / REMOVE PACEMAKER    . LEFT HEART CATHETERIZATION WITH CORONARY/GRAFT ANGIOGRAM N/A 03/05/2014   Procedure: LEFT HEART CATHETERIZATION WITH Beatrix Fetters;  Surgeon: Sinclair Grooms, MD;  Location[facility];  Service: Cardiovascular;  Laterality: N/A;  . LUMBAR [place] SURGERY  01-2000  . OOPHORECTOMY Bilateral   . PERMANENT PACEMAKER INSERTION N/A 07/06/2012   MDT Adapta L implanted by Dr Rayann Heman for symptomatic bradycardia  . TONSILLECTOMY  1930s  . UPPER GASTROINTESTINAL ENDOSCOPY      Social History   Socioeconomic History  . Marital status: Married    Spouse name: [name]  . Number of children: 3  . Years of education:  16  . Highest education level: Not on file  Occupational History  . Occupation: Retired     Fish farm manager: RETIRED  Tobacco Use  . Smoking status: Former Smoker    Packs/day: 0.50    Years: 30.00    Pack years: 15.00    Types: Cigarettes    Quit date: 07/05/1976    Years since quitting: 44.6  . Smokeless tobacco: Never Used  Vaping Use  . Vaping Use: Never used  Substance and Sexual Activity  . Alcohol use: No    Alcohol/week: 0.0 standard drinks    Comment: "not anymore, I haven't in I don't know years"  . Drug use: No  . Sexual activity: Never  Other Topics Concern  . Not on file  Social History Narrative   Patient is married [name]) and lives at home with her husband.   Patient has three adult children.   Patient is retired.   Patient has a college education.   Patient is right-handed.   Patient does not drink any caffeine.   Social Determinants of Health   Financial Resource Strain: Low Risk   . Difficulty of Paying Living Expenses: Not hard at all  Food Insecurity: No Food Insecurity  . Worried About Charity fundraiser in the Last Year: Never true  .  Ran Out of Food in the Last Year: Never true  Transportation Needs: No Transportation Needs  . Lack of Transportation (Medical): No  . Lack of Transportation (Non-Medical): No  Physical Activity: Unknown  . Days of Exercise per Week: Patient refused  . Minutes of Exercise per Session: Not on file  Stress: No Stress Concern Present  . Feeling of Stress : Not at all  Social Connections: Unknown  . Frequency of Communication with Friends and Family: More than three times a week  . Frequency of Social Gatherings with Friends and Family: More than three times a week  . Attends Religious Services: Patient refused  . Active Member of Clubs or Organizations: Patient refused  . Attends Archivist Meetings: Patient refused  . Marital Status: Married    Family History  Problem Relation Age of Onset  . Heart disease Sister   . Heart attack Sister   . Colon cancer Neg Hx   . Breast cancer Neg Hx   . Celiac disease Neg Hx   . Cirrhosis Neg Hx   . Clotting disorder Neg Hx   . Colitis Neg Hx   . Colon polyps Neg Hx   . Crohn's disease Neg Hx   . Cystic fibrosis Neg Hx   . Diabetes Neg Hx   . Esophageal cancer Neg Hx   . Hemochromatosis Neg Hx   . Inflammatory bowel disease Neg Hx   . Irritable bowel syndrome Neg Hx   . Kidney disease Neg Hx   . Liver cancer Neg Hx   . Liver disease Neg Hx   . Ovarian cancer Neg Hx   . Pancreatic cancer Neg Hx   . Prostate cancer Neg Hx   . Rectal cancer Neg Hx   . Stomach cancer Neg Hx   . Ulcerative colitis Neg Hx   . Uterine cancer Neg Hx   . Wilson's disease Neg Hx     Review of Systems  Constitutional: Negative for appetite change and fever.  HENT: Negative for congestion and sore throat.   Respiratory: Positive for shortness of breath (with exertion). Negative for cough and wheezing.   Cardiovascular: Negative for chest pain  and leg swelling.  Gastrointestinal: Negative for abdominal pain, diarrhea, nausea and vomiting.   Neurological: Positive for headaches. Negative for light-headedness.       Objective:   Vitals:   02/24/21 1308  BP: 106/78  Pulse: (!) 54  Temp: 98 F (36.7 C)  SpO2: 98%   BP Readings from Last 3 Encounters:  02/24/21 106/78  12/16/20 104/70  11/24/20 100/60   Wt Readings from Last 3 Encounters:  12/16/20 116 lb 12.8 oz (53 kg)  11/24/20 117 lb (53.1 kg)  11/03/20 114 lb 12.8 oz (52.1 kg)   There is no height or weight on file to calculate BMI.   Physical Exam    Constitutional: Appears well-developed and well-nourished. No distress.  HENT:  Head: Normocephalic and atraumatic.  Neck: Neck supple. No tracheal deviation present. No thyromegaly present.  No cervical lymphadenopathy Cardiovascular: Normal rate, regular rhythm and normal heart sounds.   No murmur heard. No carotid bruit .  No edema Pulmonary/Chest: Effort normal and breath sounds normal. No respiratory distress. No has no wheezes. No rales.  Skin: Skin is warm and dry. Not diaphoretic.  Psychiatric: Normal mood and affect. Behavior is normal.      Assessment & Plan:    See Problem List for Assessment and Plan of chronic medical problems.    This visit occurred during the SARS-CoV-2 public health emergency.  Safety protocols were in place, including screening questions prior to the visit, additional usage of staff PPE, and extensive cleaning of exam room while observing appropriate contact time as indicated for disinfecting solutions.

## 2021-02-24 ENCOUNTER — Ambulatory Visit (INDEPENDENT_AMBULATORY_CARE_PROVIDER_SITE_OTHER): Payer: Medicare Other | Admitting: Internal Medicine

## 2021-02-24 ENCOUNTER — Encounter: Payer: Self-pay | Admitting: Internal Medicine

## 2021-02-24 ENCOUNTER — Other Ambulatory Visit: Payer: Self-pay

## 2021-02-24 VITALS — BP 106/78 | HR 54 | Temp 98.0°F

## 2021-02-24 DIAGNOSIS — N1832 Chronic kidney disease, stage 3b: Secondary | ICD-10-CM

## 2021-02-24 DIAGNOSIS — E1165 Type 2 diabetes mellitus with hyperglycemia: Secondary | ICD-10-CM

## 2021-02-24 DIAGNOSIS — I6930 Unspecified sequelae of cerebral infarction: Secondary | ICD-10-CM

## 2021-02-24 DIAGNOSIS — E1142 Type 2 diabetes mellitus with diabetic polyneuropathy: Secondary | ICD-10-CM | POA: Diagnosis not present

## 2021-02-24 DIAGNOSIS — E039 Hypothyroidism, unspecified: Secondary | ICD-10-CM

## 2021-02-24 DIAGNOSIS — E782 Mixed hyperlipidemia: Secondary | ICD-10-CM

## 2021-02-24 DIAGNOSIS — I10 Essential (primary) hypertension: Secondary | ICD-10-CM

## 2021-02-24 DIAGNOSIS — F419 Anxiety disorder, unspecified: Secondary | ICD-10-CM

## 2021-02-24 DIAGNOSIS — R5381 Other malaise: Secondary | ICD-10-CM

## 2021-02-24 LAB — CBC WITH DIFFERENTIAL/PLATELET
Basophils Absolute: 0.1 10*3/uL (ref 0.0–0.1)
Basophils Relative: 0.7 % (ref 0.0–3.0)
Eosinophils Absolute: 0.1 10*3/uL (ref 0.0–0.7)
Eosinophils Relative: 2 % (ref 0.0–5.0)
HCT: 36.2 % (ref 36.0–46.0)
Hemoglobin: 11.7 g/dL — ABNORMAL LOW (ref 12.0–15.0)
Lymphocytes Relative: 19 % (ref 12.0–46.0)
Lymphs Abs: 1.4 10*3/uL (ref 0.7–4.0)
MCHC: 32.3 g/dL (ref 30.0–36.0)
MCV: 80.7 fl (ref 78.0–100.0)
Monocytes Absolute: 0.4 10*3/uL (ref 0.1–1.0)
Monocytes Relative: 5.5 % (ref 3.0–12.0)
Neutro Abs: 5.4 10*3/uL (ref 1.4–7.7)
Neutrophils Relative %: 72.8 % (ref 43.0–77.0)
Platelets: 337 10*3/uL (ref 150.0–400.0)
RBC: 4.49 Mil/uL (ref 3.87–5.11)
RDW: 14.7 % (ref 11.5–15.5)
WBC: 7.4 10*3/uL (ref 4.0–10.5)

## 2021-02-24 LAB — COMPREHENSIVE METABOLIC PANEL
ALT: 19 U/L (ref 0–35)
AST: 21 U/L (ref 0–37)
Albumin: 4.2 g/dL (ref 3.5–5.2)
Alkaline Phosphatase: 107 U/L (ref 39–117)
BUN: 40 mg/dL — ABNORMAL HIGH (ref 6–23)
CO2: 25 mEq/L (ref 19–32)
Calcium: 9.4 mg/dL (ref 8.4–10.5)
Chloride: 103 mEq/L (ref 96–112)
Creatinine, Ser: 1.6 mg/dL — ABNORMAL HIGH (ref 0.40–1.20)
GFR: 28.44 mL/min — ABNORMAL LOW (ref >60.00)
Glucose, Bld: 119 mg/dL — ABNORMAL HIGH (ref 70–99)
Potassium: 5.1 mEq/L (ref 3.5–5.1)
Sodium: 136 mEq/L (ref 135–145)
Total Bilirubin: 0.7 mg/dL (ref 0.2–1.2)
Total Protein: 7.2 g/dL (ref 6.0–8.3)

## 2021-02-24 LAB — HEMOGLOBIN A1C: Hgb A1c MFr Bld: 6.8 % — ABNORMAL HIGH (ref 4.6–6.5)

## 2021-02-24 LAB — TSH: TSH: 6.92 u[IU]/mL — ABNORMAL HIGH (ref 0.35–4.50)

## 2021-02-24 NOTE — Assessment & Plan Note (Signed)
Chronic cmp 

## 2021-02-24 NOTE — Assessment & Plan Note (Signed)
Chronic Controlled Lab Results  Component Value Date   HGBA1C 6.8 (H) 02/24/2021   Continue metformin 1011m g BId

## 2021-02-24 NOTE — Assessment & Plan Note (Signed)
Chronic ? Need gabapentin or not-- in attempts to simplify her medication regimen we will stop the gabapentin to see if she really need it - can restart if needed

## 2021-02-24 NOTE — Assessment & Plan Note (Signed)
Chronic Check lipid panel  Continue atorvastatin 80 mg daily  healthy diet encouraged

## 2021-02-24 NOTE — Assessment & Plan Note (Signed)
Chronic Stressed compliance with medication Check tsh Will titrate medication dose if needed Continue levothyroxine 125 mcg daily

## 2021-02-24 NOTE — Assessment & Plan Note (Signed)
Chronic H/o CVA with residual R sided weakness Continue statin, plavix BP and sugars controlled

## 2021-02-24 NOTE — Assessment & Plan Note (Signed)
Chronic Controlled, stable Continue cymbalta 60 mg daily  

## 2021-02-24 NOTE — Assessment & Plan Note (Signed)
Acute on chronic - worse now after recent illness Referral for home PT

## 2021-02-25 MED ORDER — LEVOTHYROXINE SODIUM 137 MCG PO TABS: 137.0000 ug | ORAL_TABLET | Freq: Every day | ORAL | 1 refills | Status: DC

## 2021-02-25 MED ORDER — RYBELSUS 3 MG PO TABS: 3.0000 mg | ORAL_TABLET | Freq: Every day | ORAL | 2 refills | Status: DC

## 2021-02-25 NOTE — Addendum Note (Signed)
Addended by: Binnie Rail on: 02/25/2021 01:02 PM   Modules accepted: Orders

## 2021-02-27 ENCOUNTER — Telehealth: Payer: Self-pay | Admitting: Internal Medicine

## 2021-02-27 NOTE — Telephone Encounter (Signed)
Left message for patient's husband to return call to clinic to go over results.

## 2021-02-27 NOTE — Telephone Encounter (Signed)
Husband calling for patients lab results

## 2021-03-02 NOTE — Telephone Encounter (Signed)
Spoke with husband today.

## 2021-03-02 NOTE — Telephone Encounter (Signed)
Patients husband returned call. He said he can be reached at [phone]. Please advise

## 2021-03-03 DIAGNOSIS — E1122 Type 2 diabetes mellitus with diabetic chronic kidney disease: Secondary | ICD-10-CM | POA: Diagnosis not present

## 2021-03-03 DIAGNOSIS — G3184 Mild cognitive impairment, so stated: Secondary | ICD-10-CM | POA: Diagnosis not present

## 2021-03-03 DIAGNOSIS — F419 Anxiety disorder, unspecified: Secondary | ICD-10-CM | POA: Diagnosis not present

## 2021-03-03 DIAGNOSIS — Z87891 Personal history of nicotine dependence: Secondary | ICD-10-CM | POA: Diagnosis not present

## 2021-03-03 DIAGNOSIS — I69351 Hemiplegia and hemiparesis following cerebral infarction affecting right dominant side: Secondary | ICD-10-CM | POA: Diagnosis not present

## 2021-03-03 DIAGNOSIS — I255 Ischemic cardiomyopathy: Secondary | ICD-10-CM | POA: Diagnosis not present

## 2021-03-03 DIAGNOSIS — E1142 Type 2 diabetes mellitus with diabetic polyneuropathy: Secondary | ICD-10-CM | POA: Diagnosis not present

## 2021-03-03 DIAGNOSIS — I700 Atherosclerosis of aorta: Secondary | ICD-10-CM | POA: Diagnosis not present

## 2021-03-03 DIAGNOSIS — E1165 Type 2 diabetes mellitus with hyperglycemia: Secondary | ICD-10-CM | POA: Diagnosis not present

## 2021-03-03 DIAGNOSIS — F32A Depression, unspecified: Secondary | ICD-10-CM | POA: Diagnosis not present

## 2021-03-03 DIAGNOSIS — N1832 Chronic kidney disease, stage 3b: Secondary | ICD-10-CM | POA: Diagnosis not present

## 2021-03-03 DIAGNOSIS — D631 Anemia in chronic kidney disease: Secondary | ICD-10-CM | POA: Diagnosis not present

## 2021-03-03 DIAGNOSIS — I2510 Atherosclerotic heart disease of native coronary artery without angina pectoris: Secondary | ICD-10-CM | POA: Diagnosis not present

## 2021-03-03 DIAGNOSIS — I129 Hypertensive chronic kidney disease with stage 1 through stage 4 chronic kidney disease, or unspecified chronic kidney disease: Secondary | ICD-10-CM | POA: Diagnosis not present

## 2021-03-03 DIAGNOSIS — I495 Sick sinus syndrome: Secondary | ICD-10-CM | POA: Diagnosis not present

## 2021-03-03 DIAGNOSIS — E1151 Type 2 diabetes mellitus with diabetic peripheral angiopathy without gangrene: Secondary | ICD-10-CM | POA: Diagnosis not present

## 2021-03-04 ENCOUNTER — Telehealth: Payer: Self-pay | Admitting: Internal Medicine

## 2021-03-04 MED ORDER — SITAGLIPTIN PHOSPHATE 25 MG PO TABS: 25.0000 mg | ORAL_TABLET | Freq: Every day | ORAL | 5 refills | Status: DC

## 2021-03-04 NOTE — Telephone Encounter (Signed)
An alternative was sent to the pharmacy.  Januvia 25 mg daily.

## 2021-03-04 NOTE — Telephone Encounter (Signed)
Liji from [facility] orders for PT   Frequency: 2x for 3 weeks and 1x for 4 weeks  Best contact: [phone]

## 2021-03-04 NOTE — Telephone Encounter (Signed)
Patients husband called and said that Semaglutide (RYBELSUS) 3 MG TABS was $500 when picking up from the pharmacy. He was wondering if there is alternative that could be cheaper. He can be reached at [phone]

## 2021-03-04 NOTE — Telephone Encounter (Signed)
Spoke with patient's husband today 

## 2021-03-04 NOTE — Telephone Encounter (Signed)
Spoke with Liji and verbals given.

## 2021-03-04 NOTE — Telephone Encounter (Signed)
ok 

## 2021-03-05 DIAGNOSIS — E1122 Type 2 diabetes mellitus with diabetic chronic kidney disease: Secondary | ICD-10-CM | POA: Diagnosis not present

## 2021-03-05 DIAGNOSIS — I69351 Hemiplegia and hemiparesis following cerebral infarction affecting right dominant side: Secondary | ICD-10-CM | POA: Diagnosis not present

## 2021-03-05 DIAGNOSIS — E1165 Type 2 diabetes mellitus with hyperglycemia: Secondary | ICD-10-CM | POA: Diagnosis not present

## 2021-03-05 DIAGNOSIS — D631 Anemia in chronic kidney disease: Secondary | ICD-10-CM | POA: Diagnosis not present

## 2021-03-05 DIAGNOSIS — I129 Hypertensive chronic kidney disease with stage 1 through stage 4 chronic kidney disease, or unspecified chronic kidney disease: Secondary | ICD-10-CM | POA: Diagnosis not present

## 2021-03-05 DIAGNOSIS — N1832 Chronic kidney disease, stage 3b: Secondary | ICD-10-CM | POA: Diagnosis not present

## 2021-03-09 DIAGNOSIS — D631 Anemia in chronic kidney disease: Secondary | ICD-10-CM | POA: Diagnosis not present

## 2021-03-09 DIAGNOSIS — N1832 Chronic kidney disease, stage 3b: Secondary | ICD-10-CM | POA: Diagnosis not present

## 2021-03-09 DIAGNOSIS — E1165 Type 2 diabetes mellitus with hyperglycemia: Secondary | ICD-10-CM | POA: Diagnosis not present

## 2021-03-09 DIAGNOSIS — I129 Hypertensive chronic kidney disease with stage 1 through stage 4 chronic kidney disease, or unspecified chronic kidney disease: Secondary | ICD-10-CM | POA: Diagnosis not present

## 2021-03-09 DIAGNOSIS — E1122 Type 2 diabetes mellitus with diabetic chronic kidney disease: Secondary | ICD-10-CM | POA: Diagnosis not present

## 2021-03-09 DIAGNOSIS — I69351 Hemiplegia and hemiparesis following cerebral infarction affecting right dominant side: Secondary | ICD-10-CM | POA: Diagnosis not present

## 2021-03-11 ENCOUNTER — Telehealth: Payer: Self-pay

## 2021-03-11 NOTE — Progress Notes (Signed)
    Chronic Care Management Pharmacy Assistant   Name: Janet Steele  MRN: [id number] DOB: 1928/12/22    Reason for Encounter: Initial Questions    Recent office visits:  02/24/21 Billey Gosling, PCP 02/20/21 Jodi Mourning, FNP   Recent consult visits:  None noted.   Hospital visits:  None in previous 6 months   Medications: Outpatient Encounter Medications as of 03/11/2021  Medication Sig  . atorvastatin (LIPITOR) 80 MG tablet TAKE 1 TABLET(80 MG) BY MOUTH DAILY. FOLLOW-UP APPOINTMENT WITH LABS DUE IN [place]  . clopidogrel (PLAVIX) 75 MG tablet Take 1 tablet (75 mg total) by mouth daily.  . DULoxetine (CYMBALTA) 60 MG capsule TAKE 1 CAPSULE(60 MG) BY MOUTH DAILY  . levothyroxine (SYNTHROID) 137 MCG tablet Take 1 tablet (137 mcg total) by mouth daily before breakfast.  . metoprolol tartrate (LOPRESSOR) 25 MG tablet Take 1 tablet (25 mg total) by mouth 2 (two) times daily.  . nitroGLYCERIN (NITROSTAT) 0.4 MG SL tablet Place 1 tablet (0.4 mg total) under the tongue every 5 (five) minutes as needed. For chest pain  . pantoprazole (PROTONIX) 40 MG tablet Take 1 tablet (40 mg total) by mouth daily.  . sitaGLIPtin (JANUVIA) 25 MG tablet Take 1 tablet (25 mg total) by mouth daily.   No facility-administered encounter medications on file as of 03/11/2021.   Have you seen any other providers since your last visit?  Patients husband stated no recent visits..  Any changes in your medications or health?  Patients husband stated she recently started taking Junuvia 25 mg.  Any side effects from any medications?  Patients husband stated not at this time.  Do you have an symptoms or problems not managed by your medications?  Patient shusband stated no concerns.  Any concerns about your health right now?  Patients husband stated no concerns..  Has your provider asked that you check blood pressure, blood sugar, or follow special diet at home?  Patients husband stated not at this  time.  Do you get any type of exercise on a regular basis?  Patients husband stated she gets help from [place] twice a week to assist with walking.  Can you think of a goal you would like to reach for your health?  Patients husband stated not at this time, patient is recovering from a stroke.  Do you have any problems getting your medications? Patients husband stated not at this ti.  Is there anything that you would like to discuss during the appointment?  Patients husband stated not at this time.  Please bring medications and supplements to appointment.  Star Rating Drugs: Atorvastatin  (Patient no longer use [facility] they use [facility])   Orinda Kenner, RMA Clinical Pharmacists Assistant [phone]  Time Spent: 33 mins

## 2021-03-12 ENCOUNTER — Ambulatory Visit (INDEPENDENT_AMBULATORY_CARE_PROVIDER_SITE_OTHER): Payer: Medicare Other | Admitting: Pharmacist

## 2021-03-12 ENCOUNTER — Other Ambulatory Visit: Payer: Self-pay

## 2021-03-12 DIAGNOSIS — E782 Mixed hyperlipidemia: Secondary | ICD-10-CM | POA: Diagnosis not present

## 2021-03-12 DIAGNOSIS — I10 Essential (primary) hypertension: Secondary | ICD-10-CM | POA: Diagnosis not present

## 2021-03-12 DIAGNOSIS — F3289 Other specified depressive episodes: Secondary | ICD-10-CM

## 2021-03-12 DIAGNOSIS — K219 Gastro-esophageal reflux disease without esophagitis: Secondary | ICD-10-CM

## 2021-03-12 DIAGNOSIS — E1165 Type 2 diabetes mellitus with hyperglycemia: Secondary | ICD-10-CM | POA: Diagnosis not present

## 2021-03-12 DIAGNOSIS — N1832 Chronic kidney disease, stage 3b: Secondary | ICD-10-CM

## 2021-03-12 DIAGNOSIS — I25700 Atherosclerosis of coronary artery bypass graft(s), unspecified, with unstable angina pectoris: Secondary | ICD-10-CM | POA: Diagnosis not present

## 2021-03-12 DIAGNOSIS — I6930 Unspecified sequelae of cerebral infarction: Secondary | ICD-10-CM

## 2021-03-12 NOTE — Progress Notes (Signed)
Chronic Care Management Pharmacy Note  03/12/2021 Name:  TA FAIR MRN:  [id number] DOB:  1929/11/15  Subjective: Janet Steele is an 87 y.o. year old female who is a primary patient of Burns, Claudina Lick, MD.  The CCM team was consulted for assistance with disease management and care coordination needs.    Engaged with patient by telephone for initial visit in response to provider referral for pharmacy case management and/or care coordination services.   Consent to Services:  The patient was given the following information about Chronic Care Management services today, agreed to services, and gave verbal consent: 1. CCM service includes personalized support from designated clinical staff supervised by the primary care provider, including individualized plan of care and coordination with other care providers 2. 24/7 contact phone numbers for assistance for urgent and routine care needs. 3. Service will only be billed when office clinical staff spend 20 minutes or more in a month to coordinate care. 4. Only one practitioner may furnish and bill the service in a calendar month. 5.The patient may stop CCM services at any time (effective at the end of the month) by phone call to the office staff. 6. The patient will be responsible for cost sharing (co-pay) of up to 20% of the service fee (after annual deductible is met). Patient agreed to services and consent obtained.  Patient Care Team: Binnie Rail, MD as PCP - General (Internal Medicine) Sherren Mocha, MD as PCP - Cardiology (Cardiology) Ward Givens, NP as Registered Nurse (Neurology) Irene Shipper, MD as Consulting Physician (Gastroenterology) Charlton Haws, [facility] as Pharmacist (Pharmacist)   Patient lives at home with husband Janet Steele. She has dementia and husband handles her medications. Patient stays in bed a lot. Her husband gives her her pills in bed, sometimes he finds pills on the floor - twice a week.   Recent office  visits: 02/24/21 Dr Quay Burow OV: chronic f/u, recent GI bug. Not taking meds as prescribed. Referred home PT. Trial off gabapentin to determine need. GFR < 30, stop metformin and sent Rybelsus, which was $500, so sent Januvia 25 mg.  02/20/21 NP Jodi Mourning VV: c/o fatigue, N/V. Noncompliant with meds, missing thyoid and duloxetine can contribute sx. F/u with PCP.  12/16/20 Dr Quay Burow OV: TSH very elevated d/t noncompliance. Increased levothyroxine to 125 mcg.  Recent consult visits: 12/17/20 Dr Baird Cancer (ophthalmology) f/u MD  Hospital visits: None in previous 6 months  Objective:  Lab Results  Component Value Date   CREATININE 1.60 (H) 02/24/2021   BUN 40 (H) 02/24/2021   GFR 28.44 (L) 02/24/2021   GFRNONAA 30 (L) 01/19/2020   GFRAA 35 (L) 01/19/2020   NA 136 02/24/2021   K 5.1 02/24/2021   CALCIUM 9.4 02/24/2021   CO2 25 02/24/2021   GLUCOSE 119 (H) 02/24/2021    Lab Results  Component Value Date/Time   HGBA1C 6.8 (H) 02/24/2021 01:45 PM   HGBA1C 7.0 (H) 11/03/2020 03:08 PM   GFR 28.44 (L) 02/24/2021 01:45 PM   GFR 40.67 (L) 11/03/2020 03:08 PM   MICROALBUR 3.6 (H) 05/28/2010 03:24 PM   MICROALBUR 2.1 (H) 07/18/2008 01:30 PM    Last diabetic Eye exam:  Lab Results  Component Value Date/Time   HMDIABEYEEXA No Retinopathy 01/08/2021 12:00 AM    Last diabetic Foot exam: No results found for: HMDIABFOOTEX   Lab Results  Component Value Date   CHOL 205 (H) 11/03/2020   HDL 51.00 11/03/2020   LDLCALC 126 (H)  11/03/2020   LDLDIRECT 254.0 11/01/2017   TRIG 141.0 11/03/2020   CHOLHDL 4 11/03/2020    Hepatic Function Latest Ref Rng & Units 02/24/2021 11/03/2020 05/09/2020  Total Protein 6.0 - 8.3 g/dL 7.2 7.5 7.4  Albumin 3.5 - 5.2 g/dL 4.2 4.5 4.4  AST 0 - 37 U/L '21 18 13  ' ALT 0 - 35 U/L '19 10 9  ' Alk Phosphatase 39 - 117 U/L 107 81 105  Total Bilirubin 0.2 - 1.2 mg/dL 0.7 1.1 0.9  Bilirubin, Direct 0.0 - 0.3 mg/dL - - -    Lab Results  Component Value Date/Time   TSH  6.92 (H) 02/24/2021 01:45 PM   TSH 50.83 (H) 12/16/2020 11:09 AM   FREET4 0.70 01/18/2020 08:25 AM    CBC Latest Ref Rng & Units 02/24/2021 11/03/2020 05/09/2020  WBC 4.0 - 10.5 K/uL 7.4 7.5 6.2  Hemoglobin 12.0 - 15.0 g/dL 11.7(L) 11.0(L) 10.9(L)  Hematocrit 36.0 - 46.0 % 36.2 33.0(L) 33.0(L)  Platelets 150.0 - 400.0 K/uL 337.0 276.0 279.0    No results found for: VD25OH  Clinical ASCVD: Yes  The ASCVD Risk score Mikey Bussing DC Jr., et al., 2013) failed to calculate for the following reasons:   The 2013 ASCVD risk score is only valid for ages 79 to 39   The patient has a prior MI or stroke diagnosis    Depression screen [place] 2/9 06/04/2020 01/02/2019 01/17/2017  Decreased Interest 0 0 0  Down, Depressed, Hopeless 0 1 0  PHQ - 2 Score 0 1 0  Altered sleeping - 3 -  Tired, decreased energy - 3 -  Change in appetite - 1 -  Feeling bad or failure about yourself  - 0 -  Trouble concentrating - 1 -  Moving slowly or fidgety/restless - 0 -  Suicidal thoughts - 0 -  PHQ-9 Score - 9 -  Difficult doing work/chores - Not difficult at all -  Some recent data might be hidden      Social History   Tobacco Use  Smoking Status Former Smoker  . Packs/day: 0.50  . Years: 30.00  . Pack years: 15.00  . Types: Cigarettes  . Quit date: 07/05/1976  . Years since quitting: 44.7  Smokeless Tobacco Never Used   BP Readings from Last 3 Encounters:  02/24/21 106/78  12/16/20 104/70  11/24/20 100/60   Pulse Readings from Last 3 Encounters:  02/24/21 (!) 54  12/16/20 60  11/24/20 63   Wt Readings from Last 3 Encounters:  12/16/20 116 lb 12.8 oz (53 kg)  11/24/20 117 lb (53.1 kg)  11/03/20 114 lb 12.8 oz (52.1 kg)   BMI Readings from Last 3 Encounters:  12/16/20 22.81 kg/m  11/24/20 22.85 kg/m  11/03/20 22.42 kg/m    Assessment/Interventions: Review of patient past medical history, allergies, medications, health status, including review of consultants reports, laboratory and other test  data, was performed as part of comprehensive evaluation and provision of chronic care management services.   SDOH:  (Social Determinants of Health) assessments and interventions performed: Yes  SDOH Screenings   Alcohol Screen: Low Risk   . Last Alcohol Screening Score (AUDIT): 0  Depression (PHQ2-9): Low Risk   . PHQ-2 Score: 0  Financial Resource Strain: Low Risk   . Difficulty of Paying Living Expenses: Not hard at all  Food Insecurity: No Food Insecurity  . Worried About Charity fundraiser in the Last Year: Never true  . Ran Out of Food in the Last  Year: Never true  Housing: Low Risk   . Last Housing Risk Score: 0  Physical Activity: Unknown  . Days of Exercise per Week: Patient refused  . Minutes of Exercise per Session: Not on file  Social Connections: Unknown  . Frequency of Communication with Friends and Family: More than three times a week  . Frequency of Social Gatherings with Friends and Family: More than three times a week  . Attends Religious Services: Patient refused  . Active Member of Clubs or Organizations: Patient refused  . Attends Archivist Meetings: Patient refused  . Marital Status: Married  Stress: No Stress Concern Present  . Feeling of Stress : Not at all  Tobacco Use: Medium Risk  . Smoking Tobacco Use: Former Smoker  . Smokeless Tobacco Use: Never Used  Transportation Needs: No Transportation Needs  . Lack of Transportation (Medical): No  . Lack of Transportation (Non-Medical): No    CCM Care Plan  Allergies  Allergen Reactions  . Morphine Hives, Itching and Rash  . Penicillins Itching and Rash    Has patient had a PCN reaction causing immediate rash, facial/tongue/throat swelling, SOB or lightheadedness with hypotension: Yes Has patient had a PCN reaction causing severe rash involving mucus membranes or skin necrosis: No Has patient had a PCN reaction that required hospitalization No Has patient had a PCN reaction occurring within  the last 10 years: No If all of the above answers are "NO", then may proceed with Cephalosporin use.   . Prednisone Other (See Comments)    MEMORY LOSS   . Sulfonamide Derivatives Itching and Rash  . Codeine Rash  . Hydrocodone-Acetaminophen Other (See Comments)    unknown  . Meperidine Hcl Itching  . Metoclopramide Hcl Itching and Rash  . Metoprolol Succinate Other (See Comments)    nervous  . Moxifloxacin Itching  . Oxycodone-Aspirin Rash  . Pentazocine Lactate Nausea Only  . Pioglitazone Other (See Comments)    bloating    Medications Reviewed Today    Reviewed by Charlton Haws, [facility] (Pharmacist) on 03/12/21 at 1235  Med List Status: <None>  Medication Order Taking? Sig Documenting Provider Last Dose Status Informant  atorvastatin (LIPITOR) 80 MG tablet [id number] Yes TAKE 1 TABLET(80 MG) BY MOUTH DAILY. FOLLOW-UP APPOINTMENT WITH LABS DUE IN [place], Claudina Lick, MD Taking Active   clopidogrel (PLAVIX) 75 MG tablet [id number] Yes Take 1 tablet (75 mg total) by mouth daily. Binnie Rail, MD Taking Active   DULoxetine (CYMBALTA) 60 MG capsule [id number] Yes TAKE 1 CAPSULE(60 MG) BY MOUTH DAILY Burns, Claudina Lick, MD Taking Active   levothyroxine (SYNTHROID) 137 MCG tablet [id number] Yes Take 1 tablet (137 mcg total) by mouth daily before breakfast. Binnie Rail, MD Taking Active   metoprolol tartrate (LOPRESSOR) 25 MG tablet [id number] Yes Take 1 tablet (25 mg total) by mouth 2 (two) times daily. Binnie Rail, MD Taking Active   nitroGLYCERIN (NITROSTAT) 0.4 MG SL tablet [id number] Yes Place 1 tablet (0.4 mg total) under the tongue every 5 (five) minutes as needed. For chest pain Sherren Mocha, MD Taking Active Child  pantoprazole (PROTONIX) 40 MG tablet [id number] Yes Take 1 tablet (40 mg total) by mouth daily. Binnie Rail, MD Taking Active   sitaGLIPtin (JANUVIA) 25 MG tablet [id number] Yes Take 1 tablet (25 mg total) by mouth daily. Binnie Rail, MD Taking Active   Med  List Note Remonia Richter, Irven Coe 11/03/17 1915): [facility]-[phone]  Patient Active Problem List   Diagnosis Date Noted  . Ear pain, bilateral 12/16/2020  . Aortic atherosclerosis ([place]) 11/03/2020  . CKD (chronic kidney disease) stage 3, GFR 30-59 ml/min (HCC) 11/02/2020  . Urinary frequency 10/17/2020  . Poor balance 04/29/2020  . Physical deconditioning 04/29/2020  . Cold extremities 02/19/2020  . Colitis 01/15/2020  . Headache 07/21/2019  . Lower abdominal pain 02/05/2019  . Other headache syndrome 08/14/2018  . Depression 08/14/2018  . Stroke ([place]) 11/04/2017  . Weakness 11/03/2017  . History of cerebrovascular accident (CVA) with residual deficit 11/03/2017  . Chronic back pain 07/04/2017  . PAD (peripheral artery disease) ([place]) 01/17/2017  . Overactive bladder 12/17/2016  . Anemia 05/20/2016  . Coronary artery disease involving coronary bypass graft of native heart without angina pectoris 05/19/2016  . MCI (mild cognitive impairment) with memory loss 11/18/2015  . Facial tic 05/07/2015  . Sick sinus syndrome ([place]) 02/26/2015  . Cervical dystonia 12/04/2014  . Pacemaker-Medtronic 07/07/2012  . GERD (gastroesophageal reflux disease) 07/05/2012  . Cough 08/04/2010  . Abnormal involuntary movement 12/17/2009  . Disturbance in sleep behavior 12/11/2009  . CAD, ARTERY BYPASS GRAFT 09/24/2009  . CAROTID BRUIT 09/24/2009  . Diabetes ([place]) 01/30/2009  . Anxiety 02/14/2008  . EROSIVE ESOPHAGITIS 02/14/2008  . CONSTIPATION, CHRONIC 02/14/2008  . DEGENERATIVE JOINT DISEASE 02/14/2008  . HEARING LOSS 01/04/2008  . PVD 01/04/2008  . Hypothyroidism 09/25/2007  . HYPERLIPIDEMIA 09/25/2007  . Essential hypertension 09/07/2007  . Diabetic polyneuropathy ([place]) 03/16/2007  . Stricture and stenosis of esophagus 03/16/2007  . HIATAL HERNIA 02/16/2007  . HEMORRHOIDS 12/31/1997  . DIVERTICULOSIS, COLON 12/31/1997     Immunization History  Administered Date(s) Administered  . Fluad Quad(high Dose 65+) 08/17/2019  . Influenza Whole 12/06/1997, 09/25/2007  . Influenza, High Dose Seasonal PF 09/27/2014, 09/06/2017, 09/29/2018  . Influenza-Unspecified 08/06/2013, 08/13/2015, 10/15/2016  . PFIZER(Purple Top)SARS-COV-2 Vaccination 01/10/2020, 02/20/2020  . Pneumococcal Conjugate-13 08/06/2015  . Pneumococcal Polysaccharide-23 12/06/1996, 05/28/2010  . Td 05/06/2004  . Tdap 06/05/2018    Conditions to be addressed/monitored:  Hypertension, Hyperlipidemia, Diabetes, Coronary Artery Disease, GERD, Chronic Kidney Disease, Hypothyroidism, Depression and Anxiety  Care Plan : CCM Pharmacy Care Plan  Updates made by Charlton Haws, [place] since 03/12/2021 12:00 AM    Problem: Hypertension, Hyperlipidemia, Diabetes, Coronary Artery Disease, GERD, Chronic Kidney Disease, Hypothyroidism, Depression and Anxiety   Priority: High    Long-Range Goal: Disease management   Start Date: 03/12/2021  Expected End Date: 09/11/2021  This Visit's Progress: On track  Priority: High  Note:   Current Barriers:  . Unable to independently monitor therapeutic efficacy  Pharmacist Clinical Goal(s):  Marland Kitchen Patient will achieve adherence to monitoring guidelines and medication adherence to achieve therapeutic efficacy through collaboration with PharmD and provider.   Interventions: . 1:1 collaboration with Binnie Rail, MD regarding development and update of comprehensive plan of care as evidenced by provider attestation and co-signature . Inter-disciplinary care team collaboration (see longitudinal plan of care) . Comprehensive medication review performed; medication list updated in electronic medical record  Hypertension / CKD stage 4 (BP goal <140/90) -Controlled -Current treatment: . Metoprolol tartrate 25 mg BID -Current home readings: not checking -Denies hypotensive/hypertensive symptoms -Educated on BP goals and  benefits of medications for prevention of heart attack, stroke and kidney damage; -Counseled to monitor BP at home as needed, document, and provide log at future appointments -Recommended to continue current medication  Hyperlipidemia / CAD: (LDL goal < 130) -Hx CAD (CABG 1992, redo  2003, stent place 05/2016); Hx stroke 2017 -Not ideally controlled - based on cardiac history, ideally LDL < 70 offers best protection; however given patient age, dementia, and taking max-tolerated statin, current control is reasonable -Current treatment: . Atorvastatin 80 mg daily AM . Clopidogrel 75 mg daily AM . Nitroglycerin 0.4 mg SL prn -Educated on Cholesterol goals;  Benefits of statin for ASCVD risk reduction; -Recommended to continue current medication  Diabetes (A1c goal <8%) -Controlled -Current medications: . Januvia 25 mg daily HS -Medications previously tried: metformin   -Current home glucose readings: not checking -Denies hypoglycemic/hyperglycemic symptoms -Educated on A1c and blood sugar goals; -Counseled to check feet daily and get yearly eye exams -Recommended to continue current medication  Depression/Anxiety (Goal: manage symptoms) -Controlled -Current treatment: . Duloxetine 60 mg daily AM -PHQ9: 0 (06/04/20) -GAD7: not on file -Connected with PCP for mental health support -Patient is satisfied with current regimen and denies issues -Recommended to continue current medication  Hypothyroidism (Goal: Maintain TSH in goal range) -Improved control - TSH improved, pt has issues with compliance due to dementia but lately has been doing better -Current treatment  . Levothyroxine 137 mcg daily AM -Recommended to continue current medication  GERD (Goal: manage symptoms) -Erosive esophagitis, esophageal stricture, diverticulosis -Controlled - pt is taking medication at night -Current treatment  . Pantoprazole 40 mg daily PM -Recommended to continue current medication but move to  30 min before breakfast for optimal timing  Patient Goals/Self-Care Activities . Patient will:  - take medications as prescribed focus on medication adherence by pill packs  Follow Up Plan: Telephone follow up appointment with care management team member scheduled for: 6 months      Medication Assistance: None required.  Patient affirms current coverage meets needs.  Patient's preferred pharmacy is:  [facility] [phone] - [place], [place] [address] [place] [address] Phone: [phone] Fax: [phone]  [facility] [phone] - [place], [place] - [address] [place] [address] Phone: [phone] Fax: [phone]  Uses pill box? No - prefers bottles Pt endorses 80% compliance  We discussed: Verbal consent obtained for [facility] enhanced pharmacy services (medication synchronization, adherence packaging, delivery coordination). A medication sync plan was created to allow patient to get all medications delivered once every 30 to 90 days per patient preference. Patient understands they have freedom to choose pharmacy and clinical pharmacist will coordinate care between all prescribers and [facility].  Patient decided to: Utilize [facility] for medication synchronization, packaging and delivery  Care Plan and Follow Up Patient Decision:  Patient agrees to Care Plan and Follow-up.  Plan: Telephone follow up appointment with care management team member scheduled for:  6 months  Charlene Brooke, PharmD, [place], CPP Clinical Pharmacist [facility] [phone]

## 2021-03-12 NOTE — Patient Instructions (Addendum)
Visit Information  Phone number for Pharmacist: [phone]  Thank you for meeting with me to discuss your medications! I look forward to working with you to achieve your health care goals. Below is a summary of what we talked about during the visit:  Goals Addressed            This Visit's Progress   . Manage My Medicine       Timeframe:  Long-Range Goal Priority:  High Start Date:           03/12/21                  Expected End Date:      10/04/21                 Follow Up Date 07/04/21   - call for medicine refill 2 or 3 days before it runs out - call if I am sick and can't take my medicine - keep a list of all the medicines I take; vitamins and herbals too  -Utilize [facility] for medication synchronization, packaging and delivery   Why is this important?   . These steps will help you keep on track with your medicines.   Notes:       Patient Care Plan: CCM Pharmacy Care Plan    Problem Identified: Hypertension, Hyperlipidemia, Diabetes, Coronary Artery Disease, GERD, Chronic Kidney Disease, Hypothyroidism, Depression and Anxiety   Priority: High    Long-Range Goal: Disease management   Start Date: 03/12/2021  Expected End Date: 09/11/2021  This Visit's Progress: On track  Priority: High  Note:   Current Barriers:  . Unable to independently monitor therapeutic efficacy  Pharmacist Clinical Goal(s):  Marland Kitchen Patient will achieve adherence to monitoring guidelines and medication adherence to achieve therapeutic efficacy through collaboration with PharmD and provider.   Interventions: . 1:1 collaboration with Binnie Rail, MD regarding development and update of comprehensive plan of care as evidenced by provider attestation and co-signature . Inter-disciplinary care team collaboration (see longitudinal plan of care) . Comprehensive medication review performed; medication list updated in electronic medical record  Hypertension / CKD stage 4 (BP goal  <140/90) -Controlled -Current treatment: . Metoprolol tartrate 25 mg BID -Current home readings: not checking -Denies hypotensive/hypertensive symptoms -Educated on BP goals and benefits of medications for prevention of heart attack, stroke and kidney damage; -Counseled to monitor BP at home as needed, document, and provide log at future appointments -Recommended to continue current medication  Hyperlipidemia / CAD: (LDL goal < 130) -Hx CAD (CABG 1992, redo 2003, stent place 05/2016); Hx stroke 2017 -Not ideally controlled - based on cardiac history, ideally LDL < 70 offers best protection; however given patient age, dementia, and taking max-tolerated statin, current control is reasonable -Current treatment: . Atorvastatin 80 mg daily AM . Clopidogrel 75 mg daily AM . Nitroglycerin 0.4 mg SL prn -Educated on Cholesterol goals;  Benefits of statin for ASCVD risk reduction; -Recommended to continue current medication  Diabetes (A1c goal <8%) -Controlled -Current medications: . Januvia 25 mg daily HS -Medications previously tried: metformin   -Current home glucose readings: not checking -Denies hypoglycemic/hyperglycemic symptoms -Educated on A1c and blood sugar goals; -Counseled to check feet daily and get yearly eye exams -Recommended to continue current medication  Depression/Anxiety (Goal: manage symptoms) -Controlled -Current treatment: . Duloxetine 60 mg daily AM -PHQ9: 0 (06/04/20) -GAD7: not on file -Connected with PCP for mental health support -Patient is satisfied with current regimen and denies issues -  Recommended to continue current medication  Hypothyroidism (Goal: Maintain TSH in goal range) -Improved control - TSH improved, pt has issues with compliance due to dementia but lately has been doing better -Current treatment  . Levothyroxine 137 mcg daily AM -Recommended to continue current medication  GERD (Goal: manage symptoms) -Erosive esophagitis, esophageal  stricture, diverticulosis -Controlled - pt is taking medication at night -Current treatment  . Pantoprazole 40 mg daily PM -Recommended to continue current medication but move to 30 min before breakfast for optimal timing  Patient Goals/Self-Care Activities . Patient will:  - take medications as prescribed focus on medication adherence by pill packs  Follow Up Plan: Telephone follow up appointment with care management team member scheduled for: 6 months      Janet Steele was given information about Chronic Care Management services today including:  1. CCM service includes personalized support from designated clinical staff supervised by her physician, including individualized plan of care and coordination with other care providers 2. 24/7 contact phone numbers for assistance for urgent and routine care needs. 3. Standard insurance, coinsurance, copays and deductibles apply for chronic care management only during months in which we provide at least 20 minutes of these services. Most insurances cover these services at 100%, however patients may be responsible for any copay, coinsurance and/or deductible if applicable. This service may help you avoid the need for more expensive face-to-face services. 4. Only one practitioner may furnish and bill the service in a calendar month. 5. The patient may stop CCM services at any time (effective at the end of the month) by phone call to the office staff.  Patient agreed to services and verbal consent obtained.   Patient verbalizes understanding of instructions provided today and agrees to view in [place].  Telephone follow up appointment with pharmacy team member scheduled for: 6 months  Charlene Brooke, PharmD, Para [place], CPP Clinical Pharmacist [facility] [phone]  Supporting Someone After a Stroke Caregivers provide essential physical and emotional support to people who have had a stroke. If you are supporting someone  who has had a stroke, you play an important role in coordinating schedules, helping your loved one to communicate, and helping with the overall rehabilitation plan. What do I need to know about my loved one's recovery? Recovering from a stroke can take weeks, months, or years. Some people may be able to return to a normal lifestyle, and other people may have permanent problems with movement (mobility), thinking, behavior, or communication. Understanding your loved one's condition can help you manage the role of caregiver. Your loved one's health care team may rely on you to provide information such as medical history and current medicines. How can I support my friend or family member? Planning for discharge from the hospital Ask to meet with a social worker or a stroke care coordinator, if one is available. This person can help you to plan for discharge. Before you leave the hospital, make sure you understand:  The recovery process after a stroke.  Physical, emotional, behavioral, and other changes that may affect your loved one after a stroke.  The treatments for stroke, including the medicines that your loved one has to take.  How to lower the risk of another stroke.  Diet and exercise changes for your loved one.  Whether you will need extra help at home.  Whether your loved one will need help using the bathroom, bathing, eating, or doing other activities.  Changes you have to make at  home to make it safe for your loved one.  Whether you need to get special devices or equipment for your loved one. General help  Help your loved one establish a daily routine. This may include setting reminders or having a shared day planner or calendar.  Encourage rest. Your loved one may need frequent breaks during social situations or other activities.  Be patient. Your loved one may take longer to complete tasks and to process information.  When giving instructions, give only one instruction at a  time or give step-by-step lists. Multitasking can be difficult after a stroke.  Offer assistance with household chores or other daily tasks. Make "freezer meals" that can be reheated.  Do not set expectations about your loved one's recovery. Medical visits  Gently remind your loved one of tasks and medical visits if he or she is forgetful.  Provide transportation to and from appointments.  Attend rehabilitation appointments with your loved one. By being involved in the rehabilitation plan, you can encourage your loved one and help with exercises and therapy activities at home. Preventing falls  Follow instructions to prevent falls in your loved one's home. These may include: ? Installing grab bars in bathrooms and handrails in stairways. ? Using night-lights in the bedroom, bathroom, or hallways. ? Removing rugs and mats or making them stick to the floor. ? Keeping walkways clear by removing cords and clutter from the floor.   Managing finances  Help to manage your loved one's finances. Talk with a Education officer, museum or legal professional if: ? Your loved one is unable to return to work and is in need of financial help. ? You need help paying medical bills. ? You need to establish guardianship over finances. ? You need help with estate planning. How should I care for myself? Helping a loved one recover from a stroke can be rewarding, and it can also be challenging and stressful at times. Make sure to care for your own well-being during this time. Lifestyle  Rest. Try to get 7-9 hours of uninterrupted sleep each night.  Eat a balanced diet that includes fresh fruits and vegetables, whole grains, lean proteins, and low-fat dairy.  Exercise for 30 or more minutes on 5 or more days each week.  Find ways to manage stress. These may include: ? Deep breathing, yoga, or meditation. ? Spending time outdoors. ? Journaling. Finding support  Ask for help. Take a break if you are the primary  caregiver to your loved one.  Spend time with supportive people.  Join a support group with other caregivers or family members of people who have had a stroke.  If you experience new or worsening depression or anxiety, seek counseling from a mental health professional.   Where to find more information You may find more information about supporting someone who has had a stroke from:  National Stroke Association: FetchFilms.dk  American Stroke Association: www.strokeassociation.org/STROKEORG Summary  Caregivers provide essential physical and emotional support to people who have had a stroke.  Before you leave the hospital, make sure you understand how to care for someone who has had a stroke.  It is normal to have many different emotions while caring for someone who has had a stroke. Make sure to care for your own well-being during this time. This information is not intended to replace advice given to you by your health care provider. Make sure you discuss any questions you have with your health care provider. Document Revised: 03/15/2019 Document Reviewed: 03/11/2017  Elsevier Patient Education  2021 [employer].

## 2021-03-13 ENCOUNTER — Telehealth: Payer: Self-pay | Admitting: Internal Medicine

## 2021-03-13 DIAGNOSIS — I129 Hypertensive chronic kidney disease with stage 1 through stage 4 chronic kidney disease, or unspecified chronic kidney disease: Secondary | ICD-10-CM | POA: Diagnosis not present

## 2021-03-13 DIAGNOSIS — D631 Anemia in chronic kidney disease: Secondary | ICD-10-CM | POA: Diagnosis not present

## 2021-03-13 DIAGNOSIS — N1832 Chronic kidney disease, stage 3b: Secondary | ICD-10-CM | POA: Diagnosis not present

## 2021-03-13 DIAGNOSIS — I69351 Hemiplegia and hemiparesis following cerebral infarction affecting right dominant side: Secondary | ICD-10-CM | POA: Diagnosis not present

## 2021-03-13 DIAGNOSIS — E1122 Type 2 diabetes mellitus with diabetic chronic kidney disease: Secondary | ICD-10-CM | POA: Diagnosis not present

## 2021-03-13 DIAGNOSIS — E1165 Type 2 diabetes mellitus with hyperglycemia: Secondary | ICD-10-CM | POA: Diagnosis not present

## 2021-03-13 NOTE — Telephone Encounter (Signed)
ok 

## 2021-03-13 NOTE — Telephone Encounter (Signed)
Verbals given to Liji today ?

## 2021-03-13 NOTE — Telephone Encounter (Signed)
Liji w/ [facility] called and is requesting verbals for home health speech therapy eval and to treat for difficulty with word finding and cognition. Please advise  Phone: [phone]

## 2021-03-16 ENCOUNTER — Ambulatory Visit: Payer: Medicare Other | Admitting: Internal Medicine

## 2021-03-16 ENCOUNTER — Telehealth: Payer: Self-pay | Admitting: Pharmacist

## 2021-03-16 NOTE — Progress Notes (Addendum)
    Chronic Care Management Pharmacy Assistant   Name: Janet Steele  MRN: [id number] DOB: 08-09-1929   Reason for Encounter: [facility] visits:  None in previous 6 months  Medications: Outpatient Encounter Medications as of 03/16/2021  Medication Sig   atorvastatin (LIPITOR) 80 MG tablet TAKE 1 TABLET(80 MG) BY MOUTH DAILY. FOLLOW-UP APPOINTMENT WITH LABS DUE IN NOVEMBER   clopidogrel (PLAVIX) 75 MG tablet Take 1 tablet (75 mg total) by mouth daily.   DULoxetine (CYMBALTA) 60 MG capsule TAKE 1 CAPSULE(60 MG) BY MOUTH DAILY   levothyroxine (SYNTHROID) 137 MCG tablet Take 1 tablet (137 mcg total) by mouth daily before breakfast.   metoprolol tartrate (LOPRESSOR) 25 MG tablet Take 1 tablet (25 mg total) by mouth 2 (two) times daily.   nitroGLYCERIN (NITROSTAT) 0.4 MG SL tablet Place 1 tablet (0.4 mg total) under the tongue every 5 (five) minutes as needed. For chest pain   pantoprazole (PROTONIX) 40 MG tablet Take 1 tablet (40 mg total) by mouth daily.   sitaGLIPtin (JANUVIA) 25 MG tablet Take 1 tablet (25 mg total) by mouth daily.   No facility-administered encounter medications on file as of 03/16/2021.    Pharmacist Review  The patient had an initial tele-vist with Clinical pharmacist Charlene Brooke on 03/12/21. Upon completion of the visit the patient agreed to try [facility] services for their dispensing and delivery of medications. Per clinical pharmacist request I completed an on boarding form with the list of the patients medications, current pharmacy, demographics, allergies and insurance information. The form was then forward to the clinical pharmacist for review.  [place] Pharmacist Assistant [phone]

## 2021-03-16 NOTE — Telephone Encounter (Signed)
-----   Message from Charlton Haws, [facility] sent at 03/12/2021 11:55 AM EDT ----- Regarding: Onboarding Please onboard patient

## 2021-03-18 ENCOUNTER — Other Ambulatory Visit: Payer: Self-pay

## 2021-03-18 DIAGNOSIS — I25700 Atherosclerosis of coronary artery bypass graft(s), unspecified, with unstable angina pectoris: Secondary | ICD-10-CM

## 2021-03-18 DIAGNOSIS — I200 Unstable angina: Secondary | ICD-10-CM

## 2021-03-18 MED ORDER — METOPROLOL TARTRATE 25 MG PO TABS: 25.0000 mg | ORAL_TABLET | Freq: Two times a day (BID) | ORAL | 1 refills | Status: DC

## 2021-03-18 MED ORDER — CLOPIDOGREL BISULFATE 75 MG PO TABS: 75.0000 mg | ORAL_TABLET | Freq: Every day | ORAL | 2 refills | Status: DC

## 2021-03-18 MED ORDER — ATORVASTATIN CALCIUM 80 MG PO TABS: ORAL_TABLET | ORAL | 1 refills | Status: DC

## 2021-03-18 MED ORDER — SITAGLIPTIN PHOSPHATE 25 MG PO TABS: 25.0000 mg | ORAL_TABLET | Freq: Every day | ORAL | 5 refills | Status: DC

## 2021-03-18 MED ORDER — PANTOPRAZOLE SODIUM 40 MG PO TBEC: 40.0000 mg | DELAYED_RELEASE_TABLET | Freq: Every day | ORAL | 1 refills | Status: DC

## 2021-03-18 MED ORDER — LEVOTHYROXINE SODIUM 137 MCG PO TABS: 137.0000 ug | ORAL_TABLET | Freq: Every day | ORAL | 1 refills | Status: DC

## 2021-03-18 MED ORDER — DULOXETINE HCL 60 MG PO CPEP: ORAL_CAPSULE | ORAL | 1 refills | Status: DC

## 2021-03-19 ENCOUNTER — Telehealth: Payer: Self-pay | Admitting: Internal Medicine

## 2021-03-19 DIAGNOSIS — I129 Hypertensive chronic kidney disease with stage 1 through stage 4 chronic kidney disease, or unspecified chronic kidney disease: Secondary | ICD-10-CM | POA: Diagnosis not present

## 2021-03-19 DIAGNOSIS — E1122 Type 2 diabetes mellitus with diabetic chronic kidney disease: Secondary | ICD-10-CM | POA: Diagnosis not present

## 2021-03-19 DIAGNOSIS — I69351 Hemiplegia and hemiparesis following cerebral infarction affecting right dominant side: Secondary | ICD-10-CM | POA: Diagnosis not present

## 2021-03-19 DIAGNOSIS — N1832 Chronic kidney disease, stage 3b: Secondary | ICD-10-CM | POA: Diagnosis not present

## 2021-03-19 DIAGNOSIS — E1165 Type 2 diabetes mellitus with hyperglycemia: Secondary | ICD-10-CM | POA: Diagnosis not present

## 2021-03-19 DIAGNOSIS — D631 Anemia in chronic kidney disease: Secondary | ICD-10-CM | POA: Diagnosis not present

## 2021-03-19 NOTE — Telephone Encounter (Signed)
Liji a physical therapist from [facility] calling, states the patient just had a fall and patient states she did not hit her head, small scrape on her right elbow that they put a bandaid on.

## 2021-03-20 DIAGNOSIS — E1122 Type 2 diabetes mellitus with diabetic chronic kidney disease: Secondary | ICD-10-CM | POA: Diagnosis not present

## 2021-03-20 DIAGNOSIS — D631 Anemia in chronic kidney disease: Secondary | ICD-10-CM | POA: Diagnosis not present

## 2021-03-20 DIAGNOSIS — N1832 Chronic kidney disease, stage 3b: Secondary | ICD-10-CM | POA: Diagnosis not present

## 2021-03-20 DIAGNOSIS — I69351 Hemiplegia and hemiparesis following cerebral infarction affecting right dominant side: Secondary | ICD-10-CM | POA: Diagnosis not present

## 2021-03-20 DIAGNOSIS — I129 Hypertensive chronic kidney disease with stage 1 through stage 4 chronic kidney disease, or unspecified chronic kidney disease: Secondary | ICD-10-CM | POA: Diagnosis not present

## 2021-03-20 DIAGNOSIS — E1165 Type 2 diabetes mellitus with hyperglycemia: Secondary | ICD-10-CM | POA: Diagnosis not present

## 2021-03-26 DIAGNOSIS — E1122 Type 2 diabetes mellitus with diabetic chronic kidney disease: Secondary | ICD-10-CM | POA: Diagnosis not present

## 2021-03-26 DIAGNOSIS — N1832 Chronic kidney disease, stage 3b: Secondary | ICD-10-CM | POA: Diagnosis not present

## 2021-03-26 DIAGNOSIS — D631 Anemia in chronic kidney disease: Secondary | ICD-10-CM | POA: Diagnosis not present

## 2021-03-26 DIAGNOSIS — I129 Hypertensive chronic kidney disease with stage 1 through stage 4 chronic kidney disease, or unspecified chronic kidney disease: Secondary | ICD-10-CM | POA: Diagnosis not present

## 2021-03-26 DIAGNOSIS — E1165 Type 2 diabetes mellitus with hyperglycemia: Secondary | ICD-10-CM | POA: Diagnosis not present

## 2021-03-26 DIAGNOSIS — I69351 Hemiplegia and hemiparesis following cerebral infarction affecting right dominant side: Secondary | ICD-10-CM | POA: Diagnosis not present

## 2021-03-30 ENCOUNTER — Telehealth: Payer: Self-pay

## 2021-04-01 NOTE — Progress Notes (Addendum)
Called and spoke with patient's previous pharmacy [facility] to check the status of patients prescription Januvia, per pharmacy she last got it filled on 03/06/21 and picked up a 30 day supply for $47.  Insurance is now charging $526 due to $480 deductible. [place] to see why there was no deductible applied last month but this month there is. Insurance rep stated "sometimes the insurance plan allows the patient to get their scripts filled without paying the deductible that starts in January, but at this time the deductible is still owed and it's being tacked onto this Januvia fill."   Orinda Kenner, [place] Pharmacists Assistant [phone]  Time Spent: 52

## 2021-04-02 ENCOUNTER — Telehealth: Payer: Self-pay | Admitting: Internal Medicine

## 2021-04-02 DIAGNOSIS — F419 Anxiety disorder, unspecified: Secondary | ICD-10-CM | POA: Diagnosis not present

## 2021-04-02 DIAGNOSIS — I700 Atherosclerosis of aorta: Secondary | ICD-10-CM | POA: Diagnosis not present

## 2021-04-02 DIAGNOSIS — Z87891 Personal history of nicotine dependence: Secondary | ICD-10-CM | POA: Diagnosis not present

## 2021-04-02 DIAGNOSIS — I69351 Hemiplegia and hemiparesis following cerebral infarction affecting right dominant side: Secondary | ICD-10-CM | POA: Diagnosis not present

## 2021-04-02 DIAGNOSIS — D631 Anemia in chronic kidney disease: Secondary | ICD-10-CM | POA: Diagnosis not present

## 2021-04-02 DIAGNOSIS — E1165 Type 2 diabetes mellitus with hyperglycemia: Secondary | ICD-10-CM | POA: Diagnosis not present

## 2021-04-02 DIAGNOSIS — I129 Hypertensive chronic kidney disease with stage 1 through stage 4 chronic kidney disease, or unspecified chronic kidney disease: Secondary | ICD-10-CM | POA: Diagnosis not present

## 2021-04-02 DIAGNOSIS — E1142 Type 2 diabetes mellitus with diabetic polyneuropathy: Secondary | ICD-10-CM | POA: Diagnosis not present

## 2021-04-02 DIAGNOSIS — I2510 Atherosclerotic heart disease of native coronary artery without angina pectoris: Secondary | ICD-10-CM | POA: Diagnosis not present

## 2021-04-02 DIAGNOSIS — E1151 Type 2 diabetes mellitus with diabetic peripheral angiopathy without gangrene: Secondary | ICD-10-CM | POA: Diagnosis not present

## 2021-04-02 DIAGNOSIS — I495 Sick sinus syndrome: Secondary | ICD-10-CM | POA: Diagnosis not present

## 2021-04-02 DIAGNOSIS — E1122 Type 2 diabetes mellitus with diabetic chronic kidney disease: Secondary | ICD-10-CM | POA: Diagnosis not present

## 2021-04-02 DIAGNOSIS — N1832 Chronic kidney disease, stage 3b: Secondary | ICD-10-CM | POA: Diagnosis not present

## 2021-04-02 DIAGNOSIS — I255 Ischemic cardiomyopathy: Secondary | ICD-10-CM | POA: Diagnosis not present

## 2021-04-02 DIAGNOSIS — F32A Depression, unspecified: Secondary | ICD-10-CM | POA: Diagnosis not present

## 2021-04-02 DIAGNOSIS — G3184 Mild cognitive impairment, so stated: Secondary | ICD-10-CM | POA: Diagnosis not present

## 2021-04-02 NOTE — Telephone Encounter (Signed)
ok 

## 2021-04-02 NOTE — Telephone Encounter (Signed)
Verbals given today. °

## 2021-04-02 NOTE — Telephone Encounter (Signed)
Liji a physical therapist with [facility] calling, requesting verbal orders starting 05.02.22 for 2 times a week for 2 weeks, and then 1 time a week for 2 weeks. [phone] Ok to lvm

## 2021-04-06 DIAGNOSIS — Z85828 Personal history of other malignant neoplasm of skin: Secondary | ICD-10-CM | POA: Diagnosis not present

## 2021-04-06 DIAGNOSIS — L308 Other specified dermatitis: Secondary | ICD-10-CM | POA: Diagnosis not present

## 2021-04-08 ENCOUNTER — Telehealth: Payer: Self-pay | Admitting: Internal Medicine

## 2021-04-08 NOTE — Telephone Encounter (Signed)
Liji w/ [facility] called and is requesting to hold PT this week. She said that the patient is unable to participate since she just got back from traveling. Please advise    Okay to LVM: [phone]

## 2021-04-09 NOTE — Telephone Encounter (Signed)
ok 

## 2021-04-09 NOTE — Telephone Encounter (Signed)
Spoke with Janet Steele today and verbals given.

## 2021-04-13 DIAGNOSIS — N1832 Chronic kidney disease, stage 3b: Secondary | ICD-10-CM | POA: Diagnosis not present

## 2021-04-13 DIAGNOSIS — E1165 Type 2 diabetes mellitus with hyperglycemia: Secondary | ICD-10-CM | POA: Diagnosis not present

## 2021-04-13 DIAGNOSIS — I129 Hypertensive chronic kidney disease with stage 1 through stage 4 chronic kidney disease, or unspecified chronic kidney disease: Secondary | ICD-10-CM | POA: Diagnosis not present

## 2021-04-13 DIAGNOSIS — I69351 Hemiplegia and hemiparesis following cerebral infarction affecting right dominant side: Secondary | ICD-10-CM | POA: Diagnosis not present

## 2021-04-13 DIAGNOSIS — D631 Anemia in chronic kidney disease: Secondary | ICD-10-CM | POA: Diagnosis not present

## 2021-04-13 DIAGNOSIS — E1122 Type 2 diabetes mellitus with diabetic chronic kidney disease: Secondary | ICD-10-CM | POA: Diagnosis not present

## 2021-04-15 DIAGNOSIS — N1832 Chronic kidney disease, stage 3b: Secondary | ICD-10-CM | POA: Diagnosis not present

## 2021-04-15 DIAGNOSIS — E1165 Type 2 diabetes mellitus with hyperglycemia: Secondary | ICD-10-CM | POA: Diagnosis not present

## 2021-04-15 DIAGNOSIS — D631 Anemia in chronic kidney disease: Secondary | ICD-10-CM | POA: Diagnosis not present

## 2021-04-15 DIAGNOSIS — I129 Hypertensive chronic kidney disease with stage 1 through stage 4 chronic kidney disease, or unspecified chronic kidney disease: Secondary | ICD-10-CM | POA: Diagnosis not present

## 2021-04-15 DIAGNOSIS — E1122 Type 2 diabetes mellitus with diabetic chronic kidney disease: Secondary | ICD-10-CM | POA: Diagnosis not present

## 2021-04-15 DIAGNOSIS — I69351 Hemiplegia and hemiparesis following cerebral infarction affecting right dominant side: Secondary | ICD-10-CM | POA: Diagnosis not present

## 2021-04-15 NOTE — Telephone Encounter (Signed)
Patients husband called and was wondering if there is alternative for Januvia. He can be reached at [phone]. Please advise

## 2021-04-16 ENCOUNTER — Telehealth: Payer: Self-pay | Admitting: Internal Medicine

## 2021-04-16 NOTE — Telephone Encounter (Signed)
Ashleigh w/ [facility] called and said that the order for ST was missed and she wanted to let Dr. Quay Burow know that someone would be seeing the patient tomorrow to do the ST eval. Please advise

## 2021-04-17 NOTE — Addendum Note (Signed)
Addended by: Binnie Rail on: 04/17/2021 07:36 AM   Modules accepted: Orders

## 2021-04-17 NOTE — Telephone Encounter (Signed)
Lets see how she does without any medication for her sugars.  Try to limit her sugar intake.  She does need to control a follow-up appointment with me-lets do August.  We will recheck her blood work at that time.

## 2021-04-17 NOTE — Telephone Encounter (Signed)
Spoke with husband today and info given ?

## 2021-04-23 DIAGNOSIS — E1122 Type 2 diabetes mellitus with diabetic chronic kidney disease: Secondary | ICD-10-CM | POA: Diagnosis not present

## 2021-04-23 DIAGNOSIS — E1165 Type 2 diabetes mellitus with hyperglycemia: Secondary | ICD-10-CM | POA: Diagnosis not present

## 2021-04-23 DIAGNOSIS — D631 Anemia in chronic kidney disease: Secondary | ICD-10-CM | POA: Diagnosis not present

## 2021-04-23 DIAGNOSIS — I129 Hypertensive chronic kidney disease with stage 1 through stage 4 chronic kidney disease, or unspecified chronic kidney disease: Secondary | ICD-10-CM | POA: Diagnosis not present

## 2021-04-23 DIAGNOSIS — I69351 Hemiplegia and hemiparesis following cerebral infarction affecting right dominant side: Secondary | ICD-10-CM | POA: Diagnosis not present

## 2021-04-23 DIAGNOSIS — N1832 Chronic kidney disease, stage 3b: Secondary | ICD-10-CM | POA: Diagnosis not present

## 2021-04-28 DIAGNOSIS — E1165 Type 2 diabetes mellitus with hyperglycemia: Secondary | ICD-10-CM | POA: Diagnosis not present

## 2021-04-28 DIAGNOSIS — E1122 Type 2 diabetes mellitus with diabetic chronic kidney disease: Secondary | ICD-10-CM | POA: Diagnosis not present

## 2021-04-28 DIAGNOSIS — D631 Anemia in chronic kidney disease: Secondary | ICD-10-CM | POA: Diagnosis not present

## 2021-04-28 DIAGNOSIS — I69351 Hemiplegia and hemiparesis following cerebral infarction affecting right dominant side: Secondary | ICD-10-CM | POA: Diagnosis not present

## 2021-04-28 DIAGNOSIS — I129 Hypertensive chronic kidney disease with stage 1 through stage 4 chronic kidney disease, or unspecified chronic kidney disease: Secondary | ICD-10-CM | POA: Diagnosis not present

## 2021-04-28 DIAGNOSIS — N1832 Chronic kidney disease, stage 3b: Secondary | ICD-10-CM | POA: Diagnosis not present

## 2021-05-15 ENCOUNTER — Telehealth: Payer: Self-pay | Admitting: Pharmacist

## 2021-05-18 NOTE — Progress Notes (Signed)
    Chronic Care Management Pharmacy Assistant   Name: Janet Steele  MRN: [id number] DOB: 1929-07-18   Reason for Encounter: Medication Review    Recent office visits:  None ID  Recent consult visits:  None ID  Hospital visits:  None in previous 6 months  Medications: Outpatient Encounter Medications as of 05/15/2021  Medication Sig   atorvastatin (LIPITOR) 80 MG tablet TAKE 1 TABLET(80 MG) BY MOUTH DAILY. FOLLOW-UP APPOINTMENT WITH LABS DUE IN NOVEMBER   clopidogrel (PLAVIX) 75 MG tablet Take 1 tablet (75 mg total) by mouth daily.   DULoxetine (CYMBALTA) 60 MG capsule TAKE 1 CAPSULE(60 MG) BY MOUTH DAILY   levothyroxine (SYNTHROID) 137 MCG tablet Take 1 tablet (137 mcg total) by mouth daily before breakfast.   metoprolol tartrate (LOPRESSOR) 25 MG tablet Take 1 tablet (25 mg total) by mouth 2 (two) times daily.   nitroGLYCERIN (NITROSTAT) 0.4 MG SL tablet Place 1 tablet (0.4 mg total) under the tongue every 5 (five) minutes as needed. For chest pain   pantoprazole (PROTONIX) 40 MG tablet Take 1 tablet (40 mg total) by mouth daily.   No facility-administered encounter medications on file as of 05/15/2021.    Pharmacist Review Reviewed chart for medication changes ahead of medication coordination call.  No OVs, Consults, or hospital visits since last care coordination call/Pharmacist visit. (If appropriate, list visit date, provider name)  No medication changes indicated OR if recent visit, treatment plan here.  BP Readings from Last 3 Encounters:  02/24/21 106/78  12/16/20 104/70  11/24/20 100/60    Lab Results  Component Value Date   HGBA1C 6.8 (H) 02/24/2021     Patient obtains medications through Vials  90 Days   Last adherence delivery included:  Clopidogrel 75 mg 1 tab daily Pantoprazole 40 mg 1 tab daily Metoprolol 25 mg 1 tab bid  Atorvastatin 80 mg 1 tab daily  Patient is due for next adherence delivery on: 05/27/21. Called patient and reviewed  medications and coordinated delivery.  This delivery to include: Clopidogrel 75 mg 1 tab daily Pantoprazole 40 mg 1 tab daily Metoprolol 25 mg 1 tab bid  Atorvastatin 80 mg 1 tab daily   Patient needs refills for None ID.  Confirmed delivery date of 05/27/21, advised patient that pharmacy will contact them the morning of delivery.    Star Rating Drugs: Atorvastatin 03/18/21 90 ds  Ethelene Hal Clinical Pharmacist Assistant [phone]   Time spent:15

## 2021-05-21 ENCOUNTER — Telehealth: Payer: Self-pay | Admitting: Internal Medicine

## 2021-05-21 NOTE — Telephone Encounter (Signed)
LVM for pt to rtn my call to schedule AWV with [facility]. Please schedule this appt if pt calls the office.  °

## 2021-05-22 DIAGNOSIS — R059 Cough, unspecified: Secondary | ICD-10-CM | POA: Diagnosis not present

## 2021-05-26 DIAGNOSIS — Z85828 Personal history of other malignant neoplasm of skin: Secondary | ICD-10-CM | POA: Diagnosis not present

## 2021-05-26 DIAGNOSIS — D485 Neoplasm of uncertain behavior of skin: Secondary | ICD-10-CM | POA: Diagnosis not present

## 2021-05-26 DIAGNOSIS — C44629 Squamous cell carcinoma of skin of left upper limb, including shoulder: Secondary | ICD-10-CM | POA: Diagnosis not present

## 2021-05-26 NOTE — Progress Notes (Deleted)
Virtual Visit via Video Note  I connected with Janet Steele on 05/26/21 at  2:00 PM EDT by a video enabled telemedicine application and verified that I am speaking with the correct person using two identifiers.   I discussed the limitations of evaluation and management by telemedicine and the availability of in person appointments. The patient expressed understanding and agreed to proceed.  Present for the visit:  Myself, Dr Billey Gosling, Reather Littler.  The patient is currently at home and I am in the office.    No referring provider.    History of Present Illness:    Social History   Socioeconomic History   Marital status: Married    Spouse name: [name]   Number of children: 3   Years of education: 16   Highest education level: Not on file  Occupational History   Occupation: Retired     Fish farm manager: RETIRED  Tobacco Use   Smoking status: Former    Packs/day: 0.50    Years: 30.00    Pack years: 15.00    Types: Cigarettes    Quit date: 07/05/1976    Years since quitting: 44.9   Smokeless tobacco: Never  Vaping Use   Vaping Use: Never used  Substance and Sexual Activity   Alcohol use: No    Alcohol/week: 0.0 standard drinks    Comment: "not anymore, I haven't in I don't know years"   Drug use: No   Sexual activity: Never  Other Topics Concern   Not on file  Social History Narrative   Patient is married [name]) and lives at home with her husband.   Patient has three adult children.   Patient is retired.   Patient has a college education.   Patient is right-handed.   Patient does not drink any caffeine.   Social Determinants of Health   Financial Resource Strain: Low Risk    Difficulty of Paying Living Expenses: Not hard at all  Food Insecurity: No Food Insecurity   Worried About Charity fundraiser in the Last Year: Never true   [place] in the Last Year: Never true  Transportation Needs: No Transportation Needs   Lack of Transportation (Medical): No    Lack of Transportation (Non-Medical): No  Physical Activity: Unknown   Days of Exercise per Week: Patient refused   Minutes of Exercise per Session: Not on file  Stress: No Stress Concern Present   Feeling of Stress : Not at all  Social Connections: Unknown   Frequency of Communication with Friends and Family: More than three times a week   Frequency of Social Gatherings with Friends and Family: More than three times a week   Attends Religious Services: Patient refused   Active Member of Clubs or Organizations: Patient refused   Attends Archivist Meetings: Patient refused   Marital Status: Married     Observations/Objective: Appears well in NAD   Assessment and Plan:  See Problem List for Assessment and Plan of chronic medical problems.   Follow Up Instructions:    I discussed the assessment and treatment plan with the patient. The patient was provided an opportunity to ask questions and all were answered. The patient agreed with the plan and demonstrated an understanding of the instructions.   The patient was advised to call back or seek an in-person evaluation if the symptoms worsen or if the condition fails to improve as anticipated.    Binnie Rail, MD

## 2021-05-27 ENCOUNTER — Telehealth: Payer: Medicare Other | Admitting: Internal Medicine

## 2021-06-03 ENCOUNTER — Telehealth: Payer: Self-pay | Admitting: Internal Medicine

## 2021-06-03 NOTE — Telephone Encounter (Signed)
   Patients husband called and is requesting a medication list for the patient. He can be reached at [phone]. Please advise -

## 2021-06-04 NOTE — Telephone Encounter (Signed)
Called and spoke with pt husband, he states he wanted to know if gabapentin and melatonin was on the pt medication list. Advised him that they were not on her list. He verbalized understanding.

## 2021-06-05 ENCOUNTER — Ambulatory Visit: Payer: Self-pay

## 2021-06-05 NOTE — Chronic Care Management (AMB) (Signed)
  Care Management  Care Management Phone Note  06/05/2021 Name: Janet Steele MRN: [id number] DOB: 1929-03-17 Janet Steele is a 87 y.o. year old female who is a primary care patient of Burns, Claudina Lick, MD.   The Care Management team was consulted to assess patient's needs after disconnection with services provided by [facility].  Unsuccessful outreach was made by telephone today to assess patient care needs post discontinuation of [facility] Services.   Patient confirmed receipt of in home care service discontinuation letter:   Plan: The care management team will attempt to reach patient again to assess care needs post [facility] Services discontinuation.    Tomasa Rand, RN, BSN, CEN [facility] RN Case Manager [phone]

## 2021-06-16 ENCOUNTER — Telehealth: Payer: Self-pay | Admitting: Pharmacist

## 2021-06-16 NOTE — Progress Notes (Signed)
    Chronic Care Management Pharmacy Assistant   Name: NANEA JARED  MRN: [id number] DOB: 1929/08/25   Reason for Encounter: Medication Review    Medications: Outpatient Encounter Medications as of 06/16/2021  Medication Sig   atorvastatin (LIPITOR) 80 MG tablet TAKE 1 TABLET(80 MG) BY MOUTH DAILY. FOLLOW-UP APPOINTMENT WITH LABS DUE IN NOVEMBER   clopidogrel (PLAVIX) 75 MG tablet Take 1 tablet (75 mg total) by mouth daily.   DULoxetine (CYMBALTA) 60 MG capsule TAKE 1 CAPSULE(60 MG) BY MOUTH DAILY   levothyroxine (SYNTHROID) 137 MCG tablet Take 1 tablet (137 mcg total) by mouth daily before breakfast.   metoprolol tartrate (LOPRESSOR) 25 MG tablet Take 1 tablet (25 mg total) by mouth 2 (two) times daily.   nitroGLYCERIN (NITROSTAT) 0.4 MG SL tablet Place 1 tablet (0.4 mg total) under the tongue every 5 (five) minutes as needed. For chest pain   pantoprazole (PROTONIX) 40 MG tablet Take 1 tablet (40 mg total) by mouth daily.   No facility-administered encounter medications on file as of 06/16/2021.   Pharmacist Review  Reviewed chart for medication changes ahead of medication coordination call.  No OVs, Consults, or hospital visits since last care coordination call/Pharmacist visit. (If appropriate, list visit date, provider name)  No medication changes indicated OR if recent visit, treatment plan here.  BP Readings from Last 3 Encounters:  02/24/21 106/78  12/16/20 104/70  11/24/20 100/60    Lab Results  Component Value Date   HGBA1C 6.8 (H) 02/24/2021     Patient obtains medications through Vials  30 Days   Last adherence delivery included:  Clopidogrel 75 mg 1 tab daily Pantoprazole 40 mg 1 tab daily Metoprolol 25 mg 1 tab bid Atorvastatin 80 mg 1 tab daily   Patient is due for next adherence delivery on: 06/25/21.  Called patient and reviewed medications and coordinated delivery. This delivery to include: Clopidogrel 75 mg 1 tab daily Pantoprazole 40 mg 1 tab  daily Metoprolol 25 mg 1 tab bid Atorvastatin 80 mg 1 tab daily   Patient needs refills for none noted.  Confirmed delivery date of 06/25/21, advised patient that pharmacy will contact them the morning of delivery.   [place] Pharmacist Assistant [phone]   Time spent:20

## 2021-06-22 ENCOUNTER — Telehealth: Payer: Self-pay

## 2021-06-22 NOTE — Telephone Encounter (Signed)
  Care Management  Care Management Phone Note  06/22/2021 Name: Janet Steele MRN: [id number] DOB: 1929-03-05 Janet Steele is a 87 y.o. year old female who is a primary care patient of Burns, Claudina Lick, MD.   The Care Management team was consulted to assess patient's needs after disconnection with services provided by [facility].  Second unsuccessful outreach was made by telephone today to assess patient care needs post discontinuation of [facility] Services.   Plan: The care management team will attempt to reach patient again to assess care needs post [facility] discontinuation.    Tomasa Rand, RN, BSN, CEN [facility] [employer] [phone]

## 2021-06-24 ENCOUNTER — Ambulatory Visit (INDEPENDENT_AMBULATORY_CARE_PROVIDER_SITE_OTHER): Payer: Medicare Other | Admitting: Pharmacist

## 2021-06-24 ENCOUNTER — Other Ambulatory Visit: Payer: Self-pay

## 2021-06-24 DIAGNOSIS — E1165 Type 2 diabetes mellitus with hyperglycemia: Secondary | ICD-10-CM

## 2021-06-24 DIAGNOSIS — E782 Mixed hyperlipidemia: Secondary | ICD-10-CM | POA: Diagnosis not present

## 2021-06-24 DIAGNOSIS — I10 Essential (primary) hypertension: Secondary | ICD-10-CM

## 2021-06-24 DIAGNOSIS — N1832 Chronic kidney disease, stage 3b: Secondary | ICD-10-CM

## 2021-06-24 DIAGNOSIS — I25700 Atherosclerosis of coronary artery bypass graft(s), unspecified, with unstable angina pectoris: Secondary | ICD-10-CM | POA: Diagnosis not present

## 2021-06-24 NOTE — Patient Instructions (Signed)
Visit Information  Phone number for Pharmacist: [phone]   Goals Addressed             This Visit's Progress    Manage My Medicine       Timeframe:  Long-Range Goal Priority:  High Start Date:           03/12/21                  Expected End Date:      06/24/22          Follow Up Date Jan 2023   - call for medicine refill 2 or 3 days before it runs out - call if I am sick and can't take my medicine - keep a list of all the medicines I take; vitamins and herbals too  -Utilize [facility] pharmacy for medication synchronization, packaging and delivery -Consider switching to pill packs   Why is this important?   These steps will help you keep on track with your medicines.   Notes:         Patient verbalizes understanding of instructions provided today and agrees to view in [place].  Telephone follow up appointment with pharmacy team member scheduled for: 6 months  Charlene Brooke, PharmD, [place], CPP Clinical Pharmacist [facility] [phone]

## 2021-06-24 NOTE — Progress Notes (Signed)
Chronic Care Management Pharmacy Note  06/24/2021 Name:  Janet Steele MRN:  [id number] DOB:  [birth year]  Summary: -Pt husband is main caregiver and handles her meds. He does sometimes notice pills in the bed so sometimes missing doses of meds  Recommendations/Changes made from today's visit: -Advised to use pill packs with [facility], pt declines and would rather use vials -Recommend more conservative A1c goal < 8% given age/comorbidities   Subjective: Janet Steele is an 87 y.o. year old female who is a primary patient of Burns, Claudina Lick, MD.  The CCM team was consulted for assistance with disease management and care coordination needs.    Engaged with patient by telephone for follow up visit in response to provider referral for pharmacy case management and/or care coordination services.   Consent to Services:  The patient was given information about Chronic Care Management services, agreed to services, and gave verbal consent prior to initiation of services.  Please see initial visit note for detailed documentation.   Patient Care Team: Binnie Rail, MD as PCP - General (Internal Medicine) Sherren Mocha, MD as PCP - Cardiology (Cardiology) Ward Givens, NP as Registered Nurse (Neurology) Irene Shipper, MD as Consulting Physician (Gastroenterology) Charlton Haws, [facility] as Pharmacist (Pharmacist)   Patient lives at home with husband [name]. She has dementia and husband handles her medications. Patient stays in bed a lot. Her husband gives her her pills in bed, sometimes he finds pills on the floor - twice a week.   Recent office visits: 04/17/21 PCP phone: d/c Januvia d/t cost, monitor sugars w/o meds.  02/24/21 Dr Quay Burow OV: chronic f/u, recent GI bug. Not taking meds as prescribed. Referred home PT. Trial off gabapentin to determine need. GFR < 30, stop metformin and sent Rybelsus, which was $500, so sent Januvia 25 mg.  02/20/21 NP Jodi Mourning VV: c/o fatigue, N/V.  Noncompliant with meds, missing thyoid and duloxetine can contribute sx. F/u with PCP.  12/16/20 Dr Quay Burow OV: TSH very elevated d/t noncompliance. Increased levothyroxine to 125 mcg.  Recent consult visits: 12/17/20 Dr Baird Cancer (ophthalmology) f/u MD  Hospital visits: None in previous 6 months  Objective:  Lab Results  Component Value Date   CREATININE 1.60 (H) 02/24/2021   BUN 40 (H) 02/24/2021   GFR 28.44 (L) 02/24/2021   GFRNONAA 30 (L) 01/19/2020   GFRAA 35 (L) 01/19/2020   NA 136 02/24/2021   K 5.1 02/24/2021   CALCIUM 9.4 02/24/2021   CO2 25 02/24/2021   GLUCOSE 119 (H) 02/24/2021    Lab Results  Component Value Date/Time   HGBA1C 6.8 (H) 02/24/2021 01:45 PM   HGBA1C 7.0 (H) 11/03/2020 03:08 PM   GFR 28.44 (L) 02/24/2021 01:45 PM   GFR 40.67 (L) 11/03/2020 03:08 PM   MICROALBUR 3.6 (H) 05/28/2010 03:24 PM   MICROALBUR 2.1 (H) 07/18/2008 01:30 PM    Last diabetic Eye exam:  Lab Results  Component Value Date/Time   HMDIABEYEEXA No Retinopathy 01/08/2021 12:00 AM    Last diabetic Foot exam: No results found for: HMDIABFOOTEX   Lab Results  Component Value Date   CHOL 205 (H) 11/03/2020   HDL 51.00 11/03/2020   LDLCALC 126 (H) 11/03/2020   LDLDIRECT 254.0 11/01/2017   TRIG 141.0 11/03/2020   CHOLHDL 4 11/03/2020    Hepatic Function Latest Ref Rng & Units 02/24/2021 11/03/2020 05/09/2020  Total Protein 6.0 - 8.3 g/dL 7.2 7.5 7.4  Albumin 3.5 - 5.2 g/dL 4.2 4.5 4.4  AST 0 - 37 U/L '21 18 13  ' ALT 0 - 35 U/L '19 10 9  ' Alk Phosphatase 39 - 117 U/L 107 81 105  Total Bilirubin 0.2 - 1.2 mg/dL 0.7 1.1 0.9  Bilirubin, Direct 0.0 - 0.3 mg/dL - - -    Lab Results  Component Value Date/Time   TSH 6.92 (H) 02/24/2021 01:45 PM   TSH 50.83 (H) 12/16/2020 11:09 AM   FREET4 0.70 01/18/2020 08:25 AM    CBC Latest Ref Rng & Units 02/24/2021 11/03/2020 05/09/2020  WBC 4.0 - 10.5 K/uL 7.4 7.5 6.2  Hemoglobin 12.0 - 15.0 g/dL 11.7(L) 11.0(L) 10.9(L)  Hematocrit 36.0 - 46.0 %  36.2 33.0(L) 33.0(L)  Platelets 150.0 - 400.0 K/uL 337.0 276.0 279.0    No results found for: VD25OH  Clinical ASCVD: Yes  The ASCVD Risk score Mikey Bussing DC Jr., et al., 2013) failed to calculate for the following reasons:   The 2013 ASCVD risk score is only valid for ages 31 to 59   The patient has a prior MI or stroke diagnosis    Depression screen [facility] 2/9 06/04/2020 01/02/2019 01/17/2017  Decreased Interest 0 0 0  Down, Depressed, Hopeless 0 1 0  PHQ - 2 Score 0 1 0  Altered sleeping - 3 -  Tired, decreased energy - 3 -  Change in appetite - 1 -  Feeling bad or failure about yourself  - 0 -  Trouble concentrating - 1 -  Moving slowly or fidgety/restless - 0 -  Suicidal thoughts - 0 -  PHQ-9 Score - 9 -  Difficult doing work/chores - Not difficult at all -  Some recent data might be hidden      Social History   Tobacco Use  Smoking Status Former   Packs/day: 0.50   Years: 30.00   Pack years: 15.00   Types: Cigarettes   Quit date: 07/05/1976   Years since quitting: 45.0  Smokeless Tobacco Never   BP Readings from Last 3 Encounters:  02/24/21 106/78  12/16/20 104/70  11/24/20 100/60   Pulse Readings from Last 3 Encounters:  02/24/21 (!) 54  12/16/20 60  11/24/20 63   Wt Readings from Last 3 Encounters:  12/16/20 116 lb 12.8 oz (53 kg)  11/24/20 117 lb (53.1 kg)  11/03/20 114 lb 12.8 oz (52.1 kg)   BMI Readings from Last 3 Encounters:  12/16/20 22.81 kg/m  11/24/20 22.85 kg/m  11/03/20 22.42 kg/m    Assessment/Interventions: Review of patient past medical history, allergies, medications, health status, including review of consultants reports, laboratory and other test data, was performed as part of comprehensive evaluation and provision of chronic care management services.   SDOH:  (Social Determinants of Health) assessments and interventions performed: Yes  SDOH Screenings   Alcohol Screen: Not on file  Depression (PHQ2-9): Not on file  Financial  Resource Strain: Not on file  Food Insecurity: Not on file  Housing: Not on file  Physical Activity: Not on file  Social Connections: Not on file  Stress: Not on file  Tobacco Use: Medium Risk   Smoking Tobacco Use: Former   Smokeless Tobacco Use: Never  Transportation Needs: Not on file    CCM Care Plan  Allergies  Allergen Reactions   Morphine Hives, Itching and Rash   Penicillins Itching and Rash    Has patient had a PCN reaction causing immediate rash, facial/tongue/throat swelling, SOB or lightheadedness with hypotension: Yes Has patient had a PCN reaction causing severe rash  involving mucus membranes or skin necrosis: No Has patient had a PCN reaction that required hospitalization No Has patient had a PCN reaction occurring within the last 10 years: No If all of the above answers are "NO", then may proceed with Cephalosporin use.    Prednisone Other (See Comments)    MEMORY LOSS    Sulfonamide Derivatives Itching and Rash   Codeine Rash   Hydrocodone-Acetaminophen Other (See Comments)    unknown   Meperidine Hcl Itching   Metoclopramide Hcl Itching and Rash   Metoprolol Succinate Other (See Comments)    nervous   Moxifloxacin Itching   Oxycodone-Aspirin Rash   Pentazocine Lactate Nausea Only   Pioglitazone Other (See Comments)    bloating    Medications Reviewed Today     Reviewed by Charlton Haws, [facility] (Pharmacist) on 06/24/21 at 1009  Med List Status: <None>   Medication Order Taking? Sig Documenting Provider Last Dose Status Informant  atorvastatin (LIPITOR) 80 MG tablet [id number] Yes TAKE 1 TABLET(80 MG) BY MOUTH DAILY. FOLLOW-UP APPOINTMENT WITH LABS DUE IN [place], Claudina Lick, MD Taking Active   clopidogrel (PLAVIX) 75 MG tablet [id number] Yes Take 1 tablet (75 mg total) by mouth daily. Binnie Rail, MD Taking Active   DULoxetine (CYMBALTA) 60 MG capsule [id number] Yes TAKE 1 CAPSULE(60 MG) BY MOUTH DAILY Burns, Claudina Lick, MD Taking Active    levothyroxine (SYNTHROID) 137 MCG tablet [id number] Yes Take 1 tablet (137 mcg total) by mouth daily before breakfast. Binnie Rail, MD Taking Active   metoprolol tartrate (LOPRESSOR) 25 MG tablet [id number] Yes Take 1 tablet (25 mg total) by mouth 2 (two) times daily. Binnie Rail, MD Taking Active   nitroGLYCERIN (NITROSTAT) 0.4 MG SL tablet [id number] Yes Place 1 tablet (0.4 mg total) under the tongue every 5 (five) minutes as needed. For chest pain Sherren Mocha, MD Taking Active Child  pantoprazole (PROTONIX) 40 MG tablet [id number] Yes Take 1 tablet (40 mg total) by mouth daily. Binnie Rail, MD Taking Active   Med List Note Remonia Richter, Irven Coe 11/03/17 1915): [phone]            Patient Active Problem List   Diagnosis Date Noted   Ear pain, bilateral 12/16/2020   Aortic atherosclerosis ([place]) 11/03/2020   CKD (chronic kidney disease) stage 3, GFR 30-59 ml/min (HCC) 11/02/2020   Urinary frequency 10/17/2020   Poor balance 04/29/2020   Physical deconditioning 04/29/2020   Cold extremities 02/19/2020   Colitis 01/15/2020   Headache 07/21/2019   Lower abdominal pain 02/05/2019   Other headache syndrome 08/14/2018   Depression 08/14/2018   Stroke ([place]) 11/04/2017   Weakness 11/03/2017   History of cerebrovascular accident (CVA) with residual deficit 11/03/2017   Chronic back pain 07/04/2017   PAD (peripheral artery disease) ([place]) 01/17/2017   Overactive bladder 12/17/2016   Anemia 05/20/2016   Coronary artery disease involving coronary bypass graft of native heart without angina pectoris 05/19/2016   MCI (mild cognitive impairment) with memory loss 11/18/2015   Facial tic 05/07/2015   Sick sinus syndrome ([place]) 02/26/2015   Cervical dystonia 12/04/2014   Pacemaker-Medtronic 07/07/2012   GERD (gastroesophageal reflux disease) 07/05/2012   Cough 08/04/2010   Abnormal involuntary movement 12/17/2009   Disturbance in  sleep behavior 12/11/2009   CAD, ARTERY BYPASS GRAFT 09/24/2009   CAROTID BRUIT 09/24/2009   Diabetes ([place]) 01/30/2009   Anxiety 02/14/2008   EROSIVE ESOPHAGITIS 02/14/2008   CONSTIPATION, CHRONIC 02/14/2008  DEGENERATIVE JOINT DISEASE 02/14/2008   HEARING LOSS 01/04/2008   PVD 01/04/2008   Hypothyroidism 09/25/2007   HYPERLIPIDEMIA 09/25/2007   Essential hypertension 09/07/2007   Diabetic polyneuropathy ([place]) 03/16/2007   Stricture and stenosis of esophagus 03/16/2007   HIATAL HERNIA 02/16/2007   HEMORRHOIDS 12/31/1997   DIVERTICULOSIS, COLON 12/31/1997    Immunization History  Administered Date(s) Administered   Fluad Quad(high Dose 65+) 08/17/2019   Influenza Whole 12/06/1997, 09/25/2007   Influenza, High Dose Seasonal PF 09/27/2014, 09/06/2017, 09/29/2018   Influenza-Unspecified 08/06/2013, 08/13/2015, 10/15/2016   PFIZER(Purple Top)SARS-COV-2 Vaccination 01/10/2020, 02/20/2020   Pneumococcal Conjugate-13 08/06/2015   Pneumococcal Polysaccharide-23 12/06/1996, 05/28/2010   Td 05/06/2004   Tdap 06/05/2018    Conditions to be addressed/monitored:  Hypertension, Hyperlipidemia, Diabetes, Coronary Artery Disease, GERD, Chronic Kidney Disease, Hypothyroidism, Depression and Anxiety  Care Plan : [place]  Updates made by Charlton Haws, [place] since 06/24/2021 12:00 AM     Problem: Hypertension, Hyperlipidemia, Diabetes, Coronary Artery Disease, Chronic Kidney Disease   Priority: High     Long-Range Goal: Disease management   Start Date: 03/12/2021  Expected End Date: 09/11/2021  This Visit's Progress: On track  Recent Progress: On track  Priority: High  Note:   Current Barriers:  Unable to independently monitor therapeutic efficacy Caregiver cannot always ensure compliance with daily medications  Pharmacist Clinical Goal(s):  Patient will achieve adherence to monitoring guidelines and medication adherence to achieve therapeutic efficacy through  collaboration with PharmD and provider.   Interventions: 1:1 collaboration with Binnie Rail, MD regarding development and update of comprehensive plan of care as evidenced by provider attestation and co-signature Inter-disciplinary care team collaboration (see longitudinal plan of care) Comprehensive medication review performed; medication list updated in electronic medical record  Hypertension / CKD stage 4 (BP goal <140/90) -Controlled - BP is at goal in clinic; pt is not checking BP at home; denies s/sx of high or low BP -Current treatment: Metoprolol tartrate 25 mg BID -Educated on BP goals and benefits of medications for prevention of heart attack, stroke and kidney damage; -Counseled to monitor BP at home as needed, document, and provide log at future appointments -Recommended to continue current medication  Hyperlipidemia / CAD: (LDL goal < 130) -Hx CAD (CABG 1992, redo 2003, stent place 05/2016); Hx stroke 2017 -Controlled- based on cardiac history, ideally LDL < 70 offers best protection; however given patient age, dementia, and taking max-tolerated statin, current control is reasonable (LDL improved from 283 to 126 over time) -Current treatment: Atorvastatin 80 mg daily AM Clopidogrel 75 mg daily AM Nitroglycerin 0.4 mg SL prn -Educated on Cholesterol goals; Benefits of statin for ASCVD risk reduction; -Recommended to continue current medication  Diabetes (A1c goal <8%) -Controlled - A1c was at goal with Januvia, however it became too expensive and was discontinued; a higher A1c goal is reasonable given age and comorbidities, it is likely DM can be controlled without medication -Current medications: None -Medications previously tried: metformin, Januvia (cost) -Current home glucose readings: not checking -Denies hypoglycemic/hyperglycemic symptoms -Educated on A1c and blood sugar goals; -Recommended to continue diet-control  Patient Goals/Self-Care Activities Patient  will:  - take medications as prescribed -Focus on medication adherence by routine; consider switching to pill packs for easier medication management       Medication Assistance: None required.  Patient affirms current coverage meets needs.  Compliance/Adherence/Medication fill history: Care Gaps: Shingrix Covid booster (due 07/22/20) DEXA scan Urine microalbumin (due 05/29/11)  Star-Rating Drugs: Atorvastatin -  LF 05/25/21 x 30 ds  Patient's preferred pharmacy is:  [facility] - [place], [place] - [address] [place] [address] Phone: [phone] Fax: [phone]  Uses pill box? No - prefers bottles Pt endorses 80% compliance  We discussed: Reviewed patient's [facility] medication and Epic medication profile assuring there are no discrepancies or gaps in therapy. Confirmed all fill dates appropriate and verified with patient that there is a sufficient quantity of all prescribed medications at home. Informed patient to call me any time if needing medications before scheduled deliveries.   Patient decided to: Utilize [facility] pharmacy for medication synchronization, packaging and delivery  Care Plan and Follow Up Patient Decision:  Patient agrees to Care Plan and Follow-up.  Plan: Telephone follow up appointment with care management team member scheduled for:  6 months  Charlene Brooke, PharmD, [place], CPP Clinical Pharmacist [facility] at [facility] [phone]

## 2021-06-26 ENCOUNTER — Telehealth: Payer: Self-pay

## 2021-06-26 NOTE — Telephone Encounter (Signed)
  Care Management  Care Management Phone Note  06/26/2021 Name: Janet Steele MRN: [id number] DOB: 1929/05/03 Janet Steele is a 87 y.o. year old female who is a primary care patient of Burns, Claudina Lick, MD.   The Care Management team was consulted to assess patient's needs after disconnection with services provided by [facility].  Successful outreach was made by telephone today to assess patient care needs post discontinuation of [facility] Services.  Spoke with patients husband and they are both interested in Case Management services.  Patient confirmed receipt of in home care service discontinuation letter: No  Plan: The patient expressed ongoing care management/care coordination service needs and will be contacted by the scheduling team for an appointment with the RN Care Manager.    Tomasa Rand, RN, BSN, CEN [facility] [employer] [phone]

## 2021-06-29 ENCOUNTER — Other Ambulatory Visit: Payer: Self-pay | Admitting: Internal Medicine

## 2021-07-08 ENCOUNTER — Telehealth: Payer: Self-pay | Admitting: *Deleted

## 2021-07-08 NOTE — Chronic Care Management (AMB) (Signed)
  Chronic Care Management   Outreach Note  07/08/2021 Name: Janet Steele MRN: [id number] DOB: March 25, 1929  Janet Steele is a 87 y.o. year old female who is a primary care patient of Burns, Claudina Lick, MD. I reached out to Rob Bunting by phone today in response to a referral sent by Janet Steele PCP, Janet Steele.      An unsuccessful telephone outreach was attempted today. The patient was referred to the case management team for assistance with care management and care coordination.   Follow Up Plan: A HIPAA compliant phone message was left for the patient providing contact information and requesting a return call.  The care management team will reach out to the patient again over the next 7-14 days.  If patient returns call to provider office, please advise to call [place] * at [phone].*  [place] Management  Direct Dial: [phone]

## 2021-07-13 ENCOUNTER — Telehealth: Payer: Self-pay | Admitting: Pharmacist

## 2021-07-13 NOTE — Progress Notes (Addendum)
    Chronic Care Management Pharmacy Assistant   Name: BRIGETTE HOPFER  MRN: [id number] DOB: 11/14/1929  Reason for encounter: Medication Coordination Call  Medications: Outpatient Encounter Medications as of 07/13/2021  Medication Sig   atorvastatin (LIPITOR) 80 MG tablet TAKE 1 TABLET(80 MG) BY MOUTH DAILY. FOLLOW-UP APPOINTMENT WITH LABS DUE IN NOVEMBER   clopidogrel (PLAVIX) 75 MG tablet Take 1 tablet (75 mg total) by mouth daily.   DULoxetine (CYMBALTA) 60 MG capsule TAKE 1 CAPSULE(60 MG) BY MOUTH DAILY   levothyroxine (SYNTHROID) 137 MCG tablet Take 1 tablet (137 mcg total) by mouth daily before breakfast.   metoprolol tartrate (LOPRESSOR) 25 MG tablet Take 1 tablet (25 mg total) by mouth 2 (two) times daily.   nitroGLYCERIN (NITROSTAT) 0.4 MG SL tablet Place 1 tablet (0.4 mg total) under the tongue every 5 (five) minutes as needed. For chest pain   pantoprazole (PROTONIX) 40 MG tablet Take 1 tablet (40 mg total) by mouth daily.   No facility-administered encounter medications on file as of 07/13/2021.   Reviewed chart for medication changes ahead of medication coordination call.  No OVs, Consults, or hospital visits since last care coordination call/Pharmacist visit. (If appropriate, list visit date, provider name)  No medication changes indicated OR if recent visit, treatment plan here.  BP Readings from Last 3 Encounters:  02/24/21 106/78  12/16/20 104/70  11/24/20 100/60    Lab Results  Component Value Date   HGBA1C 6.8 (H) 02/24/2021     Patient obtains medications through Adherence Packaging  30 Days   Last adherence delivery included:  Clopidogrel 75 mg 1 tab daily Pantoprazole 40 mg 1 tab daily Metoprolol 25 mg 1 tab bid Atorvastatin 80 mg 1 tab daily  Patient is due for next adherence delivery on: 07/24/21. Called patient and reviewed medications and coordinated delivery.  This delivery to include: Clopidogrel 75 mg 1 tab daily Pantoprazole 40 mg 1 tab  daily Metoprolol 25 mg 1 tab bid Atorvastatin 80 mg 1 tab daily Levothyroxine 137 mcg daily Duloxetine 60 mg daily  Confirmed delivery date of 07/24/21, advised patient that pharmacy will contact them the morning of delivery.   Star Rating Drugs: Atorvastatin -  last fill 03/18/21 [place], RMA Clinical Pharmacists Assistant [phone]  Time Spent: 57

## 2021-07-14 DIAGNOSIS — H353212 Exudative age-related macular degeneration, right eye, with inactive choroidal neovascularization: Secondary | ICD-10-CM | POA: Diagnosis not present

## 2021-07-14 DIAGNOSIS — H43813 Vitreous degeneration, bilateral: Secondary | ICD-10-CM | POA: Diagnosis not present

## 2021-07-14 DIAGNOSIS — H353122 Nonexudative age-related macular degeneration, left eye, intermediate dry stage: Secondary | ICD-10-CM | POA: Diagnosis not present

## 2021-07-14 DIAGNOSIS — H43393 Other vitreous opacities, bilateral: Secondary | ICD-10-CM | POA: Diagnosis not present

## 2021-07-22 NOTE — Chronic Care Management (AMB) (Signed)
  Chronic Care Management   Note  07/22/2021 Name: Janet Steele MRN: [id number] DOB: June 20, 1929  Janet Steele is a 87 y.o. year old female who is a primary care patient of Burns, Claudina Lick, MD. I reached out to Rob Bunting by phone today in response to a referral sent by Janet Steele's PCP, Dr. Quay Burow.      Janet Steele was given information about Chronic Care Management services today including:  CCM service includes personalized support from designated clinical staff supervised by her physician, including individualized plan of care and coordination with other care providers 24/7 contact phone numbers for assistance for urgent and routine care needs. Service will only be billed when office clinical staff spend 20 minutes or more in a month to coordinate care. Only one practitioner may furnish and bill the service in a calendar month. The patient may stop CCM services at any time (effective at the end of the month) by phone call to the office staff. The patient will be responsible for cost sharing (co-pay) of up to 20% of the service fee (after annual deductible is met).  Patient agreed to services and verbal consent obtained.   Follow up plan: Telephone appointment with care management team member scheduled for:08/07/21  [facility]  Direct Dial: [phone]

## 2021-07-28 ENCOUNTER — Telehealth (INDEPENDENT_AMBULATORY_CARE_PROVIDER_SITE_OTHER): Payer: Medicare Other | Admitting: Family Medicine

## 2021-07-28 DIAGNOSIS — R059 Cough, unspecified: Secondary | ICD-10-CM

## 2021-07-28 MED ORDER — BENZONATATE 100 MG PO CAPS: 100.0000 mg | ORAL_CAPSULE | Freq: Three times a day (TID) | ORAL | 0 refills | Status: DC | PRN

## 2021-07-28 NOTE — Patient Instructions (Signed)
-  I sent the medication(s) we discussed to your pharmacy: Meds ordered this encounter  Medications   benzonatate (TESSALON PERLES) 100 MG capsule    Sig: Take 1 capsule (100 mg total) by mouth 3 (three) times daily as needed.    Dispense:  20 capsule    Refill:  0   Could try flonase 2 sprays each nostril daily.   I hope you are feeling better soon!  Seek in person care promptly if your symptoms worsen, new concerns arise or you are not improving with treatment over the next 3-4 days.  It was nice to meet you today. I help [employer] out with telemedicine visits on Tuesdays and Thursdays and am available for visits on those days. If you have any concerns or questions following this visit please schedule a follow up visit with your Primary Care doctor or seek care at a local urgent care clinic to avoid delays in care.

## 2021-07-28 NOTE — Progress Notes (Signed)
Virtual Visit via Telephone Note  I connected with Janet Steele on 07/28/21 at  4:20 PM EDT by telephone and verified that I am speaking with the correct person using two identifiers.   I discussed the limitations, risks, security and privacy concerns of performing an evaluation and management service by telephone and the availability of in person appointments. I also discussed with the patient that there may be a patient responsible charge related to this service. The patient expressed understanding and agreed to proceed.  Location patient: home, [place] Location provider: work or home office Participants present for the call: patient, provider Patient did not have a visit with me in the prior 7 days to address this/these issue(s).   History of Present Illness:  Acute telemedicine visit for cough and congestion: -Onset: has had a cough for many months -Symptoms include: intermittent cough, sometimes has a little mucus when she coughs, not every day, usually ok at night -Denies: fever, CP, SOB, hemoptysis, nasal congestion, malaise, worsening cough -Has tried:protonix -Pertinent past medical history: see -Pertinent medication allergies:  Allergies  Allergen Reactions   Morphine Hives, Itching and Rash   Penicillins Itching and Rash    Has patient had a PCN reaction causing immediate rash, facial/tongue/throat swelling, SOB or lightheadedness with hypotension: Yes Has patient had a PCN reaction causing severe rash involving mucus membranes or skin necrosis: No Has patient had a PCN reaction that required hospitalization No Has patient had a PCN reaction occurring within the last 10 years: No If all of the above answers are "NO", then may proceed with Cephalosporin use.    Prednisone Other (See Comments)    MEMORY LOSS    Sulfonamide Derivatives Itching and Rash   Codeine Rash   Hydrocodone-Acetaminophen Other (See Comments)    unknown   Meperidine Hcl Itching   Metoclopramide Hcl  Itching and Rash   Metoprolol Succinate Other (See Comments)    nervous   Moxifloxacin Itching   Oxycodone-Aspirin Rash   Pentazocine Lactate Nausea Only   Pioglitazone Other (See Comments)    bloating   Past Medical History:  Diagnosis Date   Anemia    Anxiety    Arthritis    "fingers" (05/19/2016   CAD (coronary artery disease)    a. s/p CABG 1982 b. redo CABG 2003. c. [place] despite med rx 05/2016: successful but complicated PCI of PDA beyond graft site, c/b localized dissection requiring overlapping stent.   Carotid bruit    a. Duplex 01/2015: patent vessels, 1-39% BICA, f/u PRN recommened.   Colon polyp    adenomatous   Colon, diverticulosis    Degenerative joint disease    Diastolic dysfunction    Esophageal stricture    GERD (gastroesophageal reflux disease)    Hiatal hernia    History of blood transfusion    "related to my bypass"   Hyperlipidemia    Hypertension    Hypothyroidism    Ischemic cardiomyopathy    a. Cath 05/2016: EF 40% with severe inferior wall HK, suspect hibernating myocardium.   Myocardial infarction ([place]) 1977; 1982   Osteoarthrosis, unspecified whether generalized or localized, unspecified site    Presence of permanent cardiac pacemaker    PVD (peripheral vascular disease) ([place])    Stroke ([place])    greater than 2 years ago per husband   Type II diabetes mellitus ([place])    Unspecified hearing loss      Observations/Objective: Patient sounds cheerful and well on the phone. I do not appreciate  any SOB. Speech and thought processing are grossly intact. Patient reported vitals:  Assessment and Plan:  Cough  -we discussed possible serious and likely etiologies, options for evaluation and workup, limitations of telemedicine visit vs in person visit, treatment, treatment risks and precautions. Discussed various causes of chronic cough. She is on an acid reducer. She has not had a CXR for this. Did advise if not improving promptly needs inperson exam  with her PCP. She could try an allergies regimen. Also opted for Tessalon.  Advised to seek prompt in person care if worsening, new symptoms arise, or if is not improving with treatment over the next 3-4 days. Advised of options for inperson care in case PCP office not available. Did let the patient know that I only do telemedicine shifts for [place] on Tuesdays and Thursdays and advised a follow up visit with PCP or at an [facility] if has further questions or concerns.   Follow Up Instructions:  I did not refer this patient for an OV with me in the next 24 hours for this/these issue(s).  I discussed the assessment and treatment plan with the patient. The patient was provided an opportunity to ask questions and all were answered. The patient agreed with the plan and demonstrated an understanding of the instructions.   I spent 15 minutes on the date of this visit in the care of this patient. See summary of tasks completed to properly care for this patient in the detailed notes above which also included counseling of above, review of PMH, medications, allergies, evaluation of the patient and ordering and/or  instructing patient on testing and care options.     [name], DO

## 2021-08-07 ENCOUNTER — Telehealth: Payer: Medicare Other

## 2021-08-09 ENCOUNTER — Other Ambulatory Visit: Payer: Self-pay | Admitting: Internal Medicine

## 2021-08-14 ENCOUNTER — Telehealth: Payer: Self-pay | Admitting: Pharmacist

## 2021-08-14 NOTE — Progress Notes (Addendum)
    Chronic Care Management Pharmacy Assistant   Name: Janet Steele MRN: [id number] DOB: [birth year]  Reason for Encounter: Medication Review   Recent office visits:  07/28/21 Maudie Mercury ([facility]) - Televisit. Cough. Start Benzonatate.  Hospital visits:  None in previous 6 months  Medications: Outpatient Encounter Medications as of 08/14/2021  Medication Sig   atorvastatin (LIPITOR) 80 MG tablet TAKE 1 TABLET(80 MG) BY MOUTH DAILY. FOLLOW-UP APPOINTMENT WITH LABS DUE IN NOVEMBER   benzonatate (TESSALON PERLES) 100 MG capsule Take 1 capsule (100 mg total) by mouth 3 (three) times daily as needed.   clopidogrel (PLAVIX) 75 MG tablet Take 1 tablet (75 mg total) by mouth daily.   DULoxetine (CYMBALTA) 60 MG capsule TAKE 1 CAPSULE(60 MG) BY MOUTH DAILY   levothyroxine (SYNTHROID) 137 MCG tablet Take 1 tablet (137 mcg total) by mouth daily before breakfast.   metoprolol tartrate (LOPRESSOR) 25 MG tablet Take 1 tablet (25 mg total) by mouth 2 (two) times daily.   nitroGLYCERIN (NITROSTAT) 0.4 MG SL tablet Place 1 tablet (0.4 mg total) under the tongue every 5 (five) minutes as needed. For chest pain   pantoprazole (PROTONIX) 40 MG tablet TAKE ONE TABLET BY MOUTH EVERY MORNING   No facility-administered encounter medications on file as of 08/14/2021.     BP Readings from Last 3 Encounters:  02/24/21 106/78  12/16/20 104/70  11/24/20 100/60    Lab Results  Component Value Date   HGBA1C 6.8 (H) 02/24/2021     Patient obtains medications through Adherence Packaging  30 Days   Last adherence delivery included:  Clopidogrel 75 mg 1 tab daily Pantoprazole 40 mg 1 tab daily Metoprolol 25 mg 1 tab bid Atorvastatin 80 mg 1 tab daily Levothyroxine 137 mcg daily Duloxetine 60 mg daily  Patient is due for next adherence delivery on: 08/26/2021. Called patient and reviewed medications and coordinated delivery.  This delivery to include: Clopidogrel 75 mg 1 tab daily Pantoprazole 40 mg 1  tab daily Metoprolol 25 mg 1 tab bid Atorvastatin 80 mg 1 tab daily Levothyroxine 137 mcg daily Duloxetine 60 mg daily  Confirmed delivery date of 08/26/21, advised patient that pharmacy will contact them the morning of delivery.  Star Rating Drugs: Atorvastatin -  last fill 05/25/21 30D  Patient had a delivery on 07/24/21 from [place], [place] Pharmacists Assistant [phone]  Time Spent: [phone]

## 2021-08-24 ENCOUNTER — Ambulatory Visit: Payer: Medicare Other | Admitting: *Deleted

## 2021-08-24 NOTE — Chronic Care Management (AMB) (Signed)
  Chronic Care Management   Follow Up Note   08/24/2021 Name: Janet Steele MRN: [id number] DOB: [birth year]  Referred by: Binnie Rail, MD Reason for referral : Chronic Care Management (CCM RN CM Initial Outreach- Unsuccessful attempt)  An unsuccessful telephone outreach was attempted today. The patient was referred to the case management team for assistance with care management and care coordination.   Attempted call to patient as scheduled; patient answered but is unable to provide HIPAA identifiers for full name, dob, and address; she requests call back when her spouse [name], on [place] is present to assist with initial call.  Follow Up Plan:  Will place request with scheduling care guide to contact patient's spouse/ caregiver/ husband to re-schedule today's missed CCM RN initial telephone appointment  Oneta Rack, RN, BSN, [place] [phone]: direct office [phone]: mobile

## 2021-08-25 NOTE — Progress Notes (Signed)
Subjective:    Patient ID: Janet Steele, female    DOB: 1929-07-18, 87 y.o.   MRN: [id number]  This visit occurred during the SARS-CoV-2 public health emergency.  Safety protocols were in place, including screening questions prior to the visit, additional usage of staff PPE, and extensive cleaning of exam room while observing appropriate contact time as indicated for disinfecting solutions.    HPI She is here for an acute visit for cold symptoms.  She is also due for routine follow-up of her chronic medical problems.  She is here alone today.  She does have dementia and is a very poor.  Her appointment notes she has had cough and congestion for several weeks  Her symptoms started a few weeks ago.  She does state a cough.  He does states the mucus, but is not 100% clear if she is coughing it up or not.  She denies any shortness of breath, wheezing or fevers.  She does state some drainage.  She denies any significant facial pain, sore throat.  She does have headaches.  She states she is taking all of her chronic medications on a daily basis    Medications and allergies reviewed with patient and updated if appropriate.  Patient Active Problem List   Diagnosis Date Noted   Aortic atherosclerosis ([place]) 11/03/2020   CKD (chronic kidney disease) stage 3, GFR 30-59 ml/min (HCC) 11/02/2020   Urinary frequency 10/17/2020   Poor balance 04/29/2020   Physical deconditioning 04/29/2020   Cold extremities 02/19/2020   Colitis 01/15/2020   Headache 07/21/2019   Other headache syndrome 08/14/2018   Depression 08/14/2018   Stroke ([place]) 11/04/2017   Weakness 11/03/2017   History of cerebrovascular accident (CVA) with residual deficit 11/03/2017   Chronic back pain 07/04/2017   PAD (peripheral artery disease) ([place]) 01/17/2017   Overactive bladder 12/17/2016   Anemia 05/20/2016   Coronary artery disease involving coronary bypass graft of native heart without angina pectoris 05/19/2016   MCI  (mild cognitive impairment) with memory loss 11/18/2015   Facial tic 05/07/2015   Sick sinus syndrome ([place]) 02/26/2015   Cervical dystonia 12/04/2014   Pacemaker-Medtronic 07/07/2012   GERD (gastroesophageal reflux disease) 07/05/2012   Cough 08/04/2010   Abnormal involuntary movement 12/17/2009   CAD, ARTERY BYPASS GRAFT 09/24/2009   CAROTID BRUIT 09/24/2009   Diabetes ([place]) 01/30/2009   Anxiety 02/14/2008   EROSIVE ESOPHAGITIS 02/14/2008   CONSTIPATION, CHRONIC 02/14/2008   DEGENERATIVE JOINT DISEASE 02/14/2008   HEARING LOSS 01/04/2008   PVD 01/04/2008   Hypothyroidism 09/25/2007   HYPERLIPIDEMIA 09/25/2007   Essential hypertension 09/07/2007   Diabetic polyneuropathy ([place]) 03/16/2007   Stricture and stenosis of esophagus 03/16/2007   HIATAL HERNIA 02/16/2007   HEMORRHOIDS 12/31/1997   DIVERTICULOSIS, COLON 12/31/1997    Current Outpatient Medications on File Prior to Visit  Medication Sig Dispense Refill   atorvastatin (LIPITOR) 80 MG tablet TAKE 1 TABLET(80 MG) BY MOUTH DAILY. FOLLOW-UP APPOINTMENT WITH LABS DUE IN NOVEMBER 90 tablet 1   benzonatate (TESSALON PERLES) 100 MG capsule Take 1 capsule (100 mg total) by mouth 3 (three) times daily as needed. 20 capsule 0   clopidogrel (PLAVIX) 75 MG tablet Take 1 tablet (75 mg total) by mouth daily. 90 tablet 2   DULoxetine (CYMBALTA) 60 MG capsule TAKE 1 CAPSULE(60 MG) BY MOUTH DAILY 90 capsule 1   levothyroxine (SYNTHROID) 137 MCG tablet Take 1 tablet (137 mcg total) by mouth daily before breakfast. 90 tablet 1  metoprolol tartrate (LOPRESSOR) 25 MG tablet Take 1 tablet (25 mg total) by mouth 2 (two) times daily. 180 tablet 1   nitroGLYCERIN (NITROSTAT) 0.4 MG SL tablet Place 1 tablet (0.4 mg total) under the tongue every 5 (five) minutes as needed. For chest pain 25 tablet 5   pantoprazole (PROTONIX) 40 MG tablet TAKE ONE TABLET BY MOUTH EVERY MORNING 90 tablet 1   triamcinolone cream (KENALOG) 0.1 %      No current  facility-administered medications on file prior to visit.    Past Medical History:  Diagnosis Date   Anemia    Anxiety    Arthritis    "fingers" (05/19/2016   CAD (coronary artery disease)    a. s/p CABG 1982 b. redo CABG 2003. c. [place] despite med rx 05/2016: successful but complicated PCI of PDA beyond graft site, c/b localized dissection requiring overlapping stent.   Carotid bruit    a. Duplex 01/2015: patent vessels, 1-39% BICA, f/u PRN recommened.   Colon polyp    adenomatous   Colon, diverticulosis    Degenerative joint disease    Diastolic dysfunction    Esophageal stricture    GERD (gastroesophageal reflux disease)    Hiatal hernia    History of blood transfusion    "related to my bypass"   Hyperlipidemia    Hypertension    Hypothyroidism    Ischemic cardiomyopathy    a. Cath 05/2016: EF 40% with severe inferior wall HK, suspect hibernating myocardium.   Myocardial infarction ([place]) 1977; 1982   Osteoarthrosis, unspecified whether generalized or localized, unspecified site    Presence of permanent cardiac pacemaker    PVD (peripheral vascular disease) ([place])    Stroke ([place])    greater than 2 years ago per husband   Type II diabetes mellitus ([place])    Unspecified hearing loss     Past Surgical History:  Procedure Laterality Date   ABDOMINAL HYSTERECTOMY     APPENDECTOMY     BACK SURGERY     CARDIAC CATHETERIZATION N/A 05/19/2016   Procedure: Left Heart Cath and Cors/Grafts Angiography;  Surgeon: Belva Crome, MD;  Location: [facility];  Service: Cardiovascular;  Laterality: N/A;   CARDIAC CATHETERIZATION N/A 05/19/2016   Procedure: Coronary Stent Intervention;  Surgeon: Belva Crome, MD;  Location: [facility];  Service: Cardiovascular;  Laterality: N/A;   CATARACT EXTRACTION W/ INTRAOCULAR LENS  IMPLANT, BILATERAL Bilateral    CHOLECYSTECTOMY OPEN     COLONOSCOPY     CORONARY ANGIOPLASTY WITH STENT PLACEMENT  05/19/2016   "2 stents?"   CORONARY ARTERY  BYPASS GRAFT  1982; 2003   x4(1982),02-2002 CABG X2   ESOPHAGOGASTRODUODENOSCOPY (EGD) WITH ESOPHAGEAL DILATION  "2-3 times"   INSERT / REPLACE / REMOVE PACEMAKER     LEFT HEART CATHETERIZATION WITH CORONARY/GRAFT ANGIOGRAM N/A 03/05/2014   Procedure: LEFT HEART CATHETERIZATION WITH Beatrix Fetters;  Surgeon: Sinclair Grooms, MD;  [facility];  Service: Cardiovascular;  Laterality: N/A;   LUMBAR DISC SURGERY  01-2000   OOPHORECTOMY Bilateral    PERMANENT PACEMAKER INSERTION N/A 07/06/2012   MDT Adapta L implanted by Dr Rayann Heman for symptomatic bradycardia   TONSILLECTOMY  1930s   UPPER GASTROINTESTINAL ENDOSCOPY      Social History   Socioeconomic History   Marital status: Married    Spouse name: [name]   Number of children: 3   Years of education: 16   Highest education level: Not on file  Occupational  History   Occupation: Retired     Fish farm manager: RETIRED  Tobacco Use   Smoking status: Former    Packs/day: 0.50    Years: 30.00    Pack years: 15.00    Types: Cigarettes    Quit date: 07/05/1976    Years since quitting: 45.1   Smokeless tobacco: Never  Vaping Use   Vaping Use: Never used  Substance and Sexual Activity   Alcohol use: No    Alcohol/week: 0.0 standard drinks    Comment: "not anymore, I haven't in I don't know years"   Drug use: No   Sexual activity: Never  Other Topics Concern   Not on file  Social History Narrative   Patient is married [name]) and lives at home with her husband.   Patient has three adult children.   Patient is retired.   Patient has a college education.   Patient is right-handed.   Patient does not drink any caffeine.   Social Determinants of Radio broadcast assistant Strain: Not on file  Food Insecurity: Not on file  Transportation Needs: Not on file  Physical Activity: Not on file  Stress: Not on file  Social Connections: Not on file    Family History  Problem Relation Age of Onset   Heart disease Sister     Heart attack Sister    Colon cancer Neg Hx    Breast cancer Neg Hx    Celiac disease Neg Hx    Cirrhosis Neg Hx    Clotting disorder Neg Hx    Colitis Neg Hx    Colon polyps Neg Hx    Crohn's disease Neg Hx    Cystic fibrosis Neg Hx    Diabetes Neg Hx    Esophageal cancer Neg Hx    Hemochromatosis Neg Hx    Inflammatory bowel disease Neg Hx    Irritable bowel syndrome Neg Hx    Kidney disease Neg Hx    Liver cancer Neg Hx    Liver disease Neg Hx    Ovarian cancer Neg Hx    Pancreatic cancer Neg Hx    Prostate cancer Neg Hx    Rectal cancer Neg Hx    Stomach cancer Neg Hx    Ulcerative colitis Neg Hx    Uterine cancer Neg Hx    Wilson's disease Neg Hx     Review of Systems  Constitutional:  Negative for chills and fever.  HENT:  Positive for postnasal drip. Negative for congestion, ear pain, sinus pain and sore throat.   Respiratory:  Positive for cough. Negative for shortness of breath and wheezing.   Cardiovascular:  Negative for chest pain, palpitations and leg swelling.  Neurological:  Positive for dizziness (occ - not new) and headaches.      Objective:   Vitals:   08/26/21 1339  BP: 104/72  Pulse: 82  Temp: 98.3 F (36.8 C)  SpO2: 95%   BP Readings from Last 3 Encounters:  08/26/21 104/72  02/24/21 106/78  12/16/20 104/70   Wt Readings from Last 3 Encounters:  12/16/20 116 lb 12.8 oz (53 kg)  11/24/20 117 lb (53.1 kg)  11/03/20 114 lb 12.8 oz (52.1 kg)   Body mass index is 22.81 kg/m.   Physical Exam    GENERAL APPEARANCE: Appears stated age, well appearing, NAD EYES: conjunctiva clear, no icterus HENT: bilateral tympanic membranes and ear canals normal, oropharynx with no erythema or exudates, trachea midline, no cervical or supraclavicular lymphadenopathy LUNGS: Occasional  wet sounding cough, unlabored breathing, good air entry bilaterally, clear to auscultation without wheeze or crackles CARDIOVASCULAR: Normal S1,S2 , no edema SKIN: Warm,  dry      Assessment & Plan:    See Problem List for Assessment and Plan of chronic medical problems.

## 2021-08-26 ENCOUNTER — Ambulatory Visit (INDEPENDENT_AMBULATORY_CARE_PROVIDER_SITE_OTHER): Payer: Medicare Other

## 2021-08-26 ENCOUNTER — Ambulatory Visit (INDEPENDENT_AMBULATORY_CARE_PROVIDER_SITE_OTHER): Payer: Medicare Other | Admitting: Internal Medicine

## 2021-08-26 ENCOUNTER — Encounter: Payer: Self-pay | Admitting: Internal Medicine

## 2021-08-26 ENCOUNTER — Other Ambulatory Visit: Payer: Self-pay

## 2021-08-26 VITALS — BP 104/72 | HR 82 | Temp 98.3°F | Ht 60.0 in

## 2021-08-26 DIAGNOSIS — I739 Peripheral vascular disease, unspecified: Secondary | ICD-10-CM

## 2021-08-26 DIAGNOSIS — I200 Unstable angina: Secondary | ICD-10-CM | POA: Diagnosis not present

## 2021-08-26 DIAGNOSIS — R059 Cough, unspecified: Secondary | ICD-10-CM

## 2021-08-26 DIAGNOSIS — E039 Hypothyroidism, unspecified: Secondary | ICD-10-CM

## 2021-08-26 DIAGNOSIS — N1832 Chronic kidney disease, stage 3b: Secondary | ICD-10-CM

## 2021-08-26 DIAGNOSIS — I10 Essential (primary) hypertension: Secondary | ICD-10-CM | POA: Diagnosis not present

## 2021-08-26 DIAGNOSIS — I700 Atherosclerosis of aorta: Secondary | ICD-10-CM | POA: Diagnosis not present

## 2021-08-26 DIAGNOSIS — K219 Gastro-esophageal reflux disease without esophagitis: Secondary | ICD-10-CM

## 2021-08-26 DIAGNOSIS — E782 Mixed hyperlipidemia: Secondary | ICD-10-CM | POA: Diagnosis not present

## 2021-08-26 DIAGNOSIS — E1165 Type 2 diabetes mellitus with hyperglycemia: Secondary | ICD-10-CM

## 2021-08-26 LAB — LIPID PANEL
Cholesterol: 166 mg/dL (ref 0–200)
HDL: 34.4 mg/dL — ABNORMAL LOW (ref >39.00)
LDL Cholesterol: 101 mg/dL — ABNORMAL HIGH (ref 0–99)
NonHDL: 131.85
Total CHOL/HDL Ratio: 5
Triglycerides: 153 mg/dL — ABNORMAL HIGH (ref 0.0–149.0)
VLDL: 30.6 mg/dL (ref 0.0–40.0)

## 2021-08-26 LAB — CBC WITH DIFFERENTIAL/PLATELET
Basophils Absolute: 0 10*3/uL (ref 0.0–0.1)
Basophils Relative: 0.4 % (ref 0.0–3.0)
Eosinophils Absolute: 0.1 10*3/uL (ref 0.0–0.7)
Eosinophils Relative: 1.1 % (ref 0.0–5.0)
HCT: 34 % — ABNORMAL LOW (ref 36.0–46.0)
Hemoglobin: 11.3 g/dL — ABNORMAL LOW (ref 12.0–15.0)
Lymphocytes Relative: 21.3 % (ref 12.0–46.0)
Lymphs Abs: 1.7 10*3/uL (ref 0.7–4.0)
MCHC: 33.2 g/dL (ref 30.0–36.0)
MCV: 77.6 fl — ABNORMAL LOW (ref 78.0–100.0)
Monocytes Absolute: 0.5 10*3/uL (ref 0.1–1.0)
Monocytes Relative: 6 % (ref 3.0–12.0)
Neutro Abs: 5.8 10*3/uL (ref 1.4–7.7)
Neutrophils Relative %: 71.2 % (ref 43.0–77.0)
Platelets: 316 10*3/uL (ref 150.0–400.0)
RBC: 4.38 Mil/uL (ref 3.87–5.11)
RDW: 15.4 % (ref 11.5–15.5)
WBC: 8.1 10*3/uL (ref 4.0–10.5)

## 2021-08-26 LAB — COMPREHENSIVE METABOLIC PANEL
ALT: 13 U/L (ref 0–35)
AST: 17 U/L (ref 0–37)
Albumin: 4.2 g/dL (ref 3.5–5.2)
Alkaline Phosphatase: 123 U/L — ABNORMAL HIGH (ref 39–117)
BUN: 26 mg/dL — ABNORMAL HIGH (ref 6–23)
CO2: 25 mEq/L (ref 19–32)
Calcium: 9.6 mg/dL (ref 8.4–10.5)
Chloride: 103 mEq/L (ref 96–112)
Creatinine, Ser: 1.29 mg/dL — ABNORMAL HIGH (ref 0.40–1.20)
GFR: 36.7 mL/min — ABNORMAL LOW (ref >60.00)
Glucose, Bld: 155 mg/dL — ABNORMAL HIGH (ref 70–99)
Potassium: 4.3 mEq/L (ref 3.5–5.1)
Sodium: 137 mEq/L (ref 135–145)
Total Bilirubin: 1 mg/dL (ref 0.2–1.2)
Total Protein: 7.7 g/dL (ref 6.0–8.3)

## 2021-08-26 LAB — HEMOGLOBIN A1C: Hgb A1c MFr Bld: 7.6 % — ABNORMAL HIGH (ref 4.6–6.5)

## 2021-08-26 LAB — TSH: TSH: 0.09 u[IU]/mL — ABNORMAL LOW (ref 0.35–5.50)

## 2021-08-26 MED ORDER — AZITHROMYCIN 250 MG PO TABS: ORAL_TABLET | ORAL | 0 refills | Status: DC

## 2021-08-26 NOTE — Assessment & Plan Note (Signed)
Acute According to family going on for several weeks-she is a poor historian and is not sure exactly when it started Sounds like it could be productive at times Will check chest x-ray Start Z-Pak since this is an acute issue and will likely infection Return if no improvement

## 2021-08-26 NOTE — Assessment & Plan Note (Signed)
Chronic Check lipid panel  Continue atorvastatin 80 mg daily 

## 2021-08-26 NOTE — Assessment & Plan Note (Signed)
Chronic Continue atorvastatin 80 mg daily 

## 2021-08-26 NOTE — Assessment & Plan Note (Signed)
Chronic Controlled Continue pantoprazole 40 mg daily

## 2021-08-26 NOTE — Assessment & Plan Note (Signed)
Chronic Lab Results  Component Value Date   HGBA1C 6.8 (H) 02/24/2021   Has been controlled with diet only Check A1c today

## 2021-08-26 NOTE — Assessment & Plan Note (Signed)
Chronic  Clinically euthyroid Currently taking levothyroxine 137 mcg daily Check tsh  Titrate med dose if needed 

## 2021-08-26 NOTE — Assessment & Plan Note (Signed)
Chronic Continue atorvastatin 80 mg daily, Plavix 75 mg daily

## 2021-08-26 NOTE — Assessment & Plan Note (Signed)
Blood pressure on the low side, which is fairly normal for her Will check CMP Will consider decreasing metoprolol, but may need to monitor heart rate For now continue metoprolol 25 mg twice daily

## 2021-08-26 NOTE — Assessment & Plan Note (Addendum)
Chronic CMP If GFR is lower may need to consider decreasing metoprolol given lower BP

## 2021-08-26 NOTE — Patient Instructions (Addendum)
  Blood work was ordered.  A chest xray was ordered.    Medications changes include :   start a [facility]pak for your cough - [facility]   Your prescription(s) have been submitted to your pharmacy. Please take as directed and contact our office if you believe you are having problem(s) with the medication(s).   Please followup in 6 months

## 2021-08-27 ENCOUNTER — Telehealth: Payer: Self-pay | Admitting: Internal Medicine

## 2021-08-27 MED ORDER — LEVOTHYROXINE SODIUM 137 MCG PO TABS: 137.0000 ug | ORAL_TABLET | Freq: Every day | ORAL | 1 refills | Status: DC

## 2021-08-27 NOTE — Telephone Encounter (Signed)
Results given to husband. 

## 2021-08-27 NOTE — Telephone Encounter (Signed)
Please let her husband know her chest x-ray is normal.  Her kidney function is stable and liver tests are normal.  Cholesterol is okay.  She has mild anemia that is stable.  Sugars are not as ideally controlled to 6 months ago-try to cut down on the sweets if possible.  Thyroid function is slightly too high.  Have her take the medication 6 days a week only.

## 2021-09-02 ENCOUNTER — Telehealth: Payer: Self-pay | Admitting: *Deleted

## 2021-09-02 NOTE — Chronic Care Management (AMB) (Signed)
  Care Management   Note  09/02/2021 Name: Janet Steele MRN: [id number] DOB: 28-Aug-1929  Janet Steele is a 87 y.o. year old female who is a primary care patient of Binnie Rail, MD and is actively engaged with the care management team. I reached out to Rob Bunting by phone today to assist with re-scheduling an initial visit with the RN Case Manager  Follow up plan: Unsuccessful telephone outreach attempt made. The care management team will reach out to the patient again over the next 7 days. If patient returns call to provider office, please advise to call [place] at [phone].  [facility] [place] [facility]  Direct Dial: [phone]

## 2021-09-07 ENCOUNTER — Telehealth: Payer: Medicare Other

## 2021-09-10 ENCOUNTER — Telehealth: Payer: Self-pay | Admitting: *Deleted

## 2021-09-10 NOTE — Progress Notes (Signed)
This encounter was created in error - please disregard.

## 2021-09-10 NOTE — Telephone Encounter (Signed)
  Care Management   Follow Up Note   09/10/2021 Name: Janet Steele MRN: [id number] DOB: 1928-12-19 Entry for 09/02/21  Referred by: Binnie Rail, MD Reason for referral : No chief complaint on file.  Scheduled outreach to patient today. Patient's husband answered phone and gave the phone to patient who was unable to state her name, dob, or address, and explained to me that she has a "hard time" remembering. I attempted to speak again with her husband (DPR), to obtain clarification/ proceed with visit but was unsuccessful.   Follow Up Plan: request for assistance from scheduling care guide to contact patient/spouse to  attempt reschedule.    Janalyn Shy MHA,BSN,RN,CCM (in collaboration with primary care manager [place]) [place] Coordination  High Risk Managed Medicaid Direct Dial:  [phone]  Fax: [phone]

## 2021-09-10 NOTE — Addendum Note (Signed)
Addended by: Clerance Lav on: 09/10/2021 04:52 PM   Modules accepted: Orders, Level of Service, SmartSet

## 2021-09-10 NOTE — Chronic Care Management (AMB) (Signed)
  Care Management   Note  09/10/2021 Name: CAMBRE MATSON MRN: [id number] DOB: 1929/06/28  LARRAINE ARGO is a 87 y.o. year old female who is a primary care patient of Binnie Rail, MD and is actively engaged with the care management team. I reached out to Rob Bunting by phone today to assist with re-scheduling an initial visit with the RN Case Manager  Follow up plan: Unsuccessful telephone outreach attempt made. A HIPAA compliant phone message was left for the patient providing contact information and requesting a return call.  The care management team will reach out to the patient again over the next 7 days.  If patient returns call to provider office, please advise to call [place] at [phone].  [place] Management  Direct Dial: [phone]

## 2021-09-15 ENCOUNTER — Other Ambulatory Visit: Payer: Self-pay | Admitting: Internal Medicine

## 2021-09-15 ENCOUNTER — Telehealth: Payer: Self-pay | Admitting: Pharmacist

## 2021-09-15 NOTE — Progress Notes (Signed)
    Chronic Care Management Pharmacy Assistant   Name: HERO MCCATHERN  MRN: [id number] DOB: Aug 28, 1929  Reason for Encounter: Medication Coordination Call  Recent office visits:  08/26/21 Quay Burow (PCP) - Essential hypertension. Start Azithromycin 250 mg.  Recent consult visits:  None noted  Hospital visits:  None in previous 6 months  Medications: Outpatient Encounter Medications as of 09/15/2021  Medication Sig   atorvastatin (LIPITOR) 80 MG tablet TAKE 1 TABLET(80 MG) BY MOUTH DAILY. FOLLOW-UP APPOINTMENT WITH LABS DUE IN NOVEMBER   azithromycin (ZITHROMAX) 250 MG tablet Take two tabs the first day and then one tab daily for four days   benzonatate (TESSALON PERLES) 100 MG capsule Take 1 capsule (100 mg total) by mouth 3 (three) times daily as needed.   clopidogrel (PLAVIX) 75 MG tablet Take 1 tablet (75 mg total) by mouth daily.   DULoxetine (CYMBALTA) 60 MG capsule TAKE 1 CAPSULE(60 MG) BY MOUTH DAILY   levothyroxine (SYNTHROID) 137 MCG tablet Take 1 tablet (137 mcg total) by mouth daily before breakfast. Take 1 tablet (137 mcg total) by mouth daily before breakfast 6 days a week only   metoprolol tartrate (LOPRESSOR) 25 MG tablet Take 1 tablet (25 mg total) by mouth 2 (two) times daily.   nitroGLYCERIN (NITROSTAT) 0.4 MG SL tablet Place 1 tablet (0.4 mg total) under the tongue every 5 (five) minutes as needed. For chest pain   pantoprazole (PROTONIX) 40 MG tablet TAKE ONE TABLET BY MOUTH EVERY MORNING   triamcinolone cream (KENALOG) 0.1 %    No facility-administered encounter medications on file as of 09/15/2021.    Reviewed chart for medication changes ahead of medication coordination call.  No OVs, Consults, or hospital visits since last care coordination call/Pharmacist visit. (If appropriate, list visit date, provider name)  No medication changes indicated OR if recent visit, treatment plan here.  BP Readings from Last 3 Encounters:  08/26/21 104/72  02/24/21 106/78   12/16/20 104/70    Lab Results  Component Value Date   HGBA1C 7.6 (H) 08/26/2021     Patient obtains medications through Vials  30 Days   Patient is due for next adherence delivery on: 09/28/21. Called patient and reviewed medications and coordinated delivery.  This delivery to include: Clopidogrel 75 mg 1 tab daily Pantoprazole 40 mg 1 tab daily Metoprolol 25 mg 1 tab bid Atorvastatin 80 mg 1 tab daily Levothyroxine 137 mcg daily Duloxetine 60 mg daily Aspirin 81 mg  Patient needs refills for: sent via [facility] Atorvastatin  Confirmed delivery date of 09/28/21, advised patient that pharmacy will contact them the morning of delivery.   Star Rating Drugs: Atorvastatin - filled 05/25/21  Orinda Kenner, [place] Clinical Pharmacists Assistant [phone]

## 2021-09-17 NOTE — Chronic Care Management (AMB) (Signed)
  Care Management   Note  09/17/2021 Name: [name] MRN: [id number] DOB: [birth year]  Janet Steele is a 87 y.o. year old female who is a primary care patient of Binnie Rail, MD and is actively engaged with the care management team. I reached out to Rob Bunting by phone today to assist with re-scheduling an initial visit with the RN Case Manager  Follow up plan: A third unsuccessful telephone outreach attempt made. A HIPAA compliant phone message was left for the patient providing contact information and requesting a return call. We have been unable to make contact with the patient for follow up. The care management team is available to follow up with the patient after provider conversation with the patient regarding recommendation for care management engagement and subsequent re-referral to the care management team. If patient returns call to provider office, please advise to call [place] at [place]: [phone]

## 2021-09-22 DIAGNOSIS — Z23 Encounter for immunization: Secondary | ICD-10-CM | POA: Diagnosis not present

## 2021-10-08 NOTE — Progress Notes (Signed)
Subjective:    Patient ID: Janet Steele, female    DOB: 09-10-1929, 87 y.o.   MRN: [id number]  This visit occurred during the SARS-CoV-2 public health emergency.  Safety protocols were in place, including screening questions prior to the visit, additional usage of staff PPE, and extensive cleaning of exam room while observing appropriate contact time as indicated for disinfecting solutions.    HPI The patient is here for an acute visit.  She is here alone - her husband is in the waiting room.  She has dementia and is a poor historian.     Frequent urination -  she is going to the bathroom every 25 minutes.  This has been going on for one year.  It does sting. She does have as much frequency during the day and does not think she can provide a sample today.   No fever, nausea, abdominal pain or new back pain.  No hematuria.   She is taking all of her medications as prescribed.     Medications and allergies reviewed with patient and updated if appropriate.  Patient Active Problem List   Diagnosis Date Noted   Aortic atherosclerosis ([place]) 11/03/2020   CKD (chronic kidney disease) stage 3, GFR 30-59 ml/min (HCC) 11/02/2020   Urinary frequency 10/17/2020   Poor balance 04/29/2020   Physical deconditioning 04/29/2020   Cold extremities 02/19/2020   Colitis 01/15/2020   Headache 07/21/2019   Other headache syndrome 08/14/2018   Depression 08/14/2018   Stroke ([place]) 11/04/2017   Weakness 11/03/2017   History of cerebrovascular accident (CVA) with residual deficit 11/03/2017   Chronic back pain 07/04/2017   PAD (peripheral artery disease) ([place]) 01/17/2017   Overactive bladder 12/17/2016   Anemia 05/20/2016   Coronary artery disease involving coronary bypass graft of native heart without angina pectoris 05/19/2016   MCI (mild cognitive impairment) with memory loss 11/18/2015   Facial tic 05/07/2015   Sick sinus syndrome ([place]) 02/26/2015   Cervical dystonia 12/04/2014    Pacemaker-Medtronic 07/07/2012   GERD (gastroesophageal reflux disease) 07/05/2012   Cough 08/04/2010   Abnormal involuntary movement 12/17/2009   CAD, ARTERY BYPASS GRAFT 09/24/2009   CAROTID BRUIT 09/24/2009   Diabetes ([place]) 01/30/2009   Anxiety 02/14/2008   EROSIVE ESOPHAGITIS 02/14/2008   CONSTIPATION, CHRONIC 02/14/2008   DEGENERATIVE JOINT DISEASE 02/14/2008   HEARING LOSS 01/04/2008   PVD 01/04/2008   Hypothyroidism 09/25/2007   HYPERLIPIDEMIA 09/25/2007   Essential hypertension 09/07/2007   Diabetic polyneuropathy ([place]) 03/16/2007   Stricture and stenosis of esophagus 03/16/2007   HIATAL HERNIA 02/16/2007   HEMORRHOIDS 12/31/1997   DIVERTICULOSIS, COLON 12/31/1997    Current Outpatient Medications on File Prior to Visit  Medication Sig Dispense Refill   atorvastatin (LIPITOR) 80 MG tablet TAKE ONE TABLET BY MOUTH EVERYDAY AT BEDTIME 90 tablet 1   azithromycin (ZITHROMAX) 250 MG tablet Take two tabs the first day and then one tab daily for four days 6 tablet 0   benzonatate (TESSALON PERLES) 100 MG capsule Take 1 capsule (100 mg total) by mouth 3 (three) times daily as needed. 20 capsule 0   clopidogrel (PLAVIX) 75 MG tablet Take 1 tablet (75 mg total) by mouth daily. 90 tablet 2   DULoxetine (CYMBALTA) 60 MG capsule TAKE 1 CAPSULE(60 MG) BY MOUTH DAILY 90 capsule 1   levothyroxine (SYNTHROID) 137 MCG tablet Take 1 tablet (137 mcg total) by mouth daily before breakfast. Take 1 tablet (137 mcg total) by mouth daily before breakfast 6  days a week only 90 tablet 1   losartan (COZAAR) 25 MG tablet      metoprolol tartrate (LOPRESSOR) 25 MG tablet Take 1 tablet (25 mg total) by mouth 2 (two) times daily. 180 tablet 1   nitroGLYCERIN (NITROSTAT) 0.4 MG SL tablet Place 1 tablet (0.4 mg total) under the tongue every 5 (five) minutes as needed. For chest pain 25 tablet 5   pantoprazole (PROTONIX) 40 MG tablet TAKE ONE TABLET BY MOUTH EVERY MORNING 90 tablet 1   triamcinolone cream  (KENALOG) 0.1 %      No current facility-administered medications on file prior to visit.    Past Medical History:  Diagnosis Date   Anemia    Anxiety    Arthritis    "fingers" (05/19/2016   CAD (coronary artery disease)    a. s/p CABG 1982 b. redo CABG 2003. c. [place] despite med rx 05/2016: successful but complicated PCI of PDA beyond graft site, c/b localized dissection requiring overlapping stent.   Carotid bruit    a. Duplex 01/2015: patent vessels, 1-39% BICA, f/u PRN recommened.   Colon polyp    adenomatous   Colon, diverticulosis    Degenerative joint disease    Diastolic dysfunction    Esophageal stricture    GERD (gastroesophageal reflux disease)    Hiatal hernia    History of blood transfusion    "related to my bypass"   Hyperlipidemia    Hypertension    Hypothyroidism    Ischemic cardiomyopathy    a. Cath 05/2016: EF 40% with severe inferior wall HK, suspect hibernating myocardium.   Myocardial infarction ([place]) 1977; 1982   Osteoarthrosis, unspecified whether generalized or localized, unspecified site    Presence of permanent cardiac pacemaker    PVD (peripheral vascular disease) ([place])    Stroke ([place])    greater than 2 years ago per husband   Type II diabetes mellitus ([place])    Unspecified hearing loss     Past Surgical History:  Procedure Laterality Date   ABDOMINAL HYSTERECTOMY     APPENDECTOMY     BACK SURGERY     CARDIAC CATHETERIZATION N/A 05/19/2016   Procedure: Left Heart Cath and Cors/Grafts Angiography;  Surgeon: Belva Crome, MD;  Location: [facility];  Service: Cardiovascular;  Laterality: N/A;   CARDIAC CATHETERIZATION N/A 05/19/2016   Procedure: Coronary Stent Intervention;  Surgeon: Belva Crome, MD;  Location: [facility];  Service: Cardiovascular;  Laterality: N/A;   CATARACT EXTRACTION W/ INTRAOCULAR LENS  IMPLANT, BILATERAL Bilateral    CHOLECYSTECTOMY OPEN     COLONOSCOPY     CORONARY ANGIOPLASTY WITH STENT PLACEMENT  05/19/2016    "2 stents?"   CORONARY ARTERY BYPASS GRAFT  1982; 2003   x4(1982),02-2002 CABG X2   ESOPHAGOGASTRODUODENOSCOPY (EGD) WITH ESOPHAGEAL DILATION  "2-3 times"   INSERT / REPLACE / REMOVE PACEMAKER     LEFT HEART CATHETERIZATION WITH CORONARY/GRAFT ANGIOGRAM N/A 03/05/2014   Procedure: LEFT HEART CATHETERIZATION WITH Beatrix Fetters;  Surgeon: Sinclair Grooms, MD;  [facility];  Service: Cardiovascular;  Laterality: N/A;   LUMBAR DISC SURGERY  01-2000   OOPHORECTOMY Bilateral    PERMANENT PACEMAKER INSERTION N/A 07/06/2012   MDT Adapta L implanted by Dr Rayann Heman for symptomatic bradycardia   TONSILLECTOMY  1930s   UPPER GASTROINTESTINAL ENDOSCOPY      Social History   Socioeconomic History   Marital status: Married    Spouse name: [name]   Number  of children: 3   Years of education: 16   Highest education level: Not on file  Occupational History   Occupation: Retired     Fish farm manager: RETIRED  Tobacco Use   Smoking status: Former    Packs/day: 0.50    Years: 30.00    Pack years: 15.00    Types: Cigarettes    Quit date: 07/05/1976    Years since quitting: 45.2   Smokeless tobacco: Never  Vaping Use   Vaping Use: Never used  Substance and Sexual Activity   Alcohol use: No    Alcohol/week: 0.0 standard drinks    Comment: "not anymore, I haven't in I don't know years"   Drug use: No   Sexual activity: Never  Other Topics Concern   Not on file  Social History Narrative   Patient is married [name]) and lives at home with her husband.   Patient has three adult children.   Patient is retired.   Patient has a college education.   Patient is right-handed.   Patient does not drink any caffeine.   Social Determinants of Radio broadcast assistant Strain: Not on file  Food Insecurity: Not on file  Transportation Needs: Not on file  Physical Activity: Not on file  Stress: Not on file  Social Connections: Not on file    Family History  Problem Relation Age  of Onset   Heart disease Sister    Heart attack Sister    Colon cancer Neg Hx    Breast cancer Neg Hx    Celiac disease Neg Hx    Cirrhosis Neg Hx    Clotting disorder Neg Hx    Colitis Neg Hx    Colon polyps Neg Hx    Crohn's disease Neg Hx    Cystic fibrosis Neg Hx    Diabetes Neg Hx    Esophageal cancer Neg Hx    Hemochromatosis Neg Hx    Inflammatory bowel disease Neg Hx    Irritable bowel syndrome Neg Hx    Kidney disease Neg Hx    Liver cancer Neg Hx    Liver disease Neg Hx    Ovarian cancer Neg Hx    Pancreatic cancer Neg Hx    Prostate cancer Neg Hx    Rectal cancer Neg Hx    Stomach cancer Neg Hx    Ulcerative colitis Neg Hx    Uterine cancer Neg Hx    Wilson's disease Neg Hx     Review of Systems  Constitutional:  Negative for fever.  Gastrointestinal:  Negative for abdominal pain and nausea.  Genitourinary:  Positive for dysuria and frequency (at night). Negative for hematuria.      Objective:   Vitals:   10/09/21 1344  BP: 102/72  Pulse: 87  Temp: 98.2 F (36.8 C)  SpO2: 93%   BP Readings from Last 3 Encounters:  10/09/21 102/72  08/26/21 104/72  02/24/21 106/78   Wt Readings from Last 3 Encounters:  12/16/20 116 lb 12.8 oz (53 kg)  11/24/20 117 lb (53.1 kg)  11/03/20 114 lb 12.8 oz (52.1 kg)   Body mass index is 22.81 kg/m.   Physical Exam    Constitutional:      General: She is not in acute distress.    Appearance: Normal appearance. She is not ill-appearing.  HENT:     Head: Normocephalic and atraumatic.  Abdominal:     General: There is no distension.     Palpations: Abdomen is soft.  Tenderness: There is no abdominal tenderness. There is no right CVA tenderness, left CVA tenderness, guarding or rebound.  Skin:    General: Skin is warm and dry.  Neurological:     Mental Status: She is alert.       Assessment & Plan:    See Problem List for Assessment and Plan of chronic medical problems.

## 2021-10-09 ENCOUNTER — Other Ambulatory Visit: Payer: Self-pay

## 2021-10-09 ENCOUNTER — Encounter: Payer: Self-pay | Admitting: Internal Medicine

## 2021-10-09 ENCOUNTER — Ambulatory Visit (INDEPENDENT_AMBULATORY_CARE_PROVIDER_SITE_OTHER): Payer: Medicare Other | Admitting: Internal Medicine

## 2021-10-09 VITALS — BP 102/72 | HR 87 | Temp 98.2°F | Ht 60.0 in

## 2021-10-09 DIAGNOSIS — N1832 Chronic kidney disease, stage 3b: Secondary | ICD-10-CM

## 2021-10-09 DIAGNOSIS — I10 Essential (primary) hypertension: Secondary | ICD-10-CM | POA: Diagnosis not present

## 2021-10-09 DIAGNOSIS — R350 Frequency of micturition: Secondary | ICD-10-CM | POA: Diagnosis not present

## 2021-10-09 DIAGNOSIS — E039 Hypothyroidism, unspecified: Secondary | ICD-10-CM

## 2021-10-09 DIAGNOSIS — E1165 Type 2 diabetes mellitus with hyperglycemia: Secondary | ICD-10-CM | POA: Diagnosis not present

## 2021-10-09 DIAGNOSIS — I200 Unstable angina: Secondary | ICD-10-CM | POA: Diagnosis not present

## 2021-10-09 LAB — COMPREHENSIVE METABOLIC PANEL
ALT: 10 U/L (ref 0–35)
AST: 13 U/L (ref 0–37)
Albumin: 4 g/dL (ref 3.5–5.2)
Alkaline Phosphatase: 134 U/L — ABNORMAL HIGH (ref 39–117)
BUN: 33 mg/dL — ABNORMAL HIGH (ref 6–23)
CO2: 25 mEq/L (ref 19–32)
Calcium: 9.4 mg/dL (ref 8.4–10.5)
Chloride: 105 mEq/L (ref 96–112)
Creatinine, Ser: 1.44 mg/dL — ABNORMAL HIGH (ref 0.40–1.20)
GFR: 32.14 mL/min — ABNORMAL LOW (ref >60.00)
Glucose, Bld: 209 mg/dL — ABNORMAL HIGH (ref 70–99)
Potassium: 4.5 mEq/L (ref 3.5–5.1)
Sodium: 138 mEq/L (ref 135–145)
Total Bilirubin: 0.7 mg/dL (ref 0.2–1.2)
Total Protein: 7.2 g/dL (ref 6.0–8.3)

## 2021-10-09 LAB — CBC WITH DIFFERENTIAL/PLATELET
Basophils Absolute: 0 10*3/uL (ref 0.0–0.1)
Basophils Relative: 0.6 % (ref 0.0–3.0)
Eosinophils Absolute: 0.1 10*3/uL (ref 0.0–0.7)
Eosinophils Relative: 1.9 % (ref 0.0–5.0)
HCT: 33.7 % — ABNORMAL LOW (ref 36.0–46.0)
Hemoglobin: 11.1 g/dL — ABNORMAL LOW (ref 12.0–15.0)
Lymphocytes Relative: 20.2 % (ref 12.0–46.0)
Lymphs Abs: 1.5 10*3/uL (ref 0.7–4.0)
MCHC: 32.8 g/dL (ref 30.0–36.0)
MCV: 78 fl (ref 78.0–100.0)
Monocytes Absolute: 0.5 10*3/uL (ref 0.1–1.0)
Monocytes Relative: 6.1 % (ref 3.0–12.0)
Neutro Abs: 5.3 10*3/uL (ref 1.4–7.7)
Neutrophils Relative %: 71.2 % (ref 43.0–77.0)
Platelets: 281 10*3/uL (ref 150.0–400.0)
RBC: 4.32 Mil/uL (ref 3.87–5.11)
RDW: 15.2 % (ref 11.5–15.5)
WBC: 7.5 10*3/uL (ref 4.0–10.5)

## 2021-10-09 LAB — HEMOGLOBIN A1C: Hgb A1c MFr Bld: 8 % — ABNORMAL HIGH (ref 4.6–6.5)

## 2021-10-09 LAB — TSH: TSH: 0.16 u[IU]/mL — ABNORMAL LOW (ref 0.35–5.50)

## 2021-10-09 NOTE — Patient Instructions (Addendum)
       Blood work was ordered.    Drop off a urine when you can next week so we can rule out an infection.   The urine frequency could be from your sugars being elevated.     Decrease your sugar intake.   No more candy.       Please followup in 6 months

## 2021-10-10 NOTE — Assessment & Plan Note (Signed)
Chronic  Clinically euthyroid Currently taking levothyroxine 137 mcg daily Check tsh  Titrate med dose if needed 

## 2021-10-10 NOTE — Assessment & Plan Note (Signed)
Chronic Has been off medication, but a1c is elevated Stressed low sugar diet - admits to eating candy a1c today - may need to restart medication

## 2021-10-10 NOTE — Assessment & Plan Note (Signed)
Chronic cmp 

## 2021-10-10 NOTE — Assessment & Plan Note (Signed)
Acute ? How long this has been going on - ? One year - may be related to elevated sugars, overactive bladder vs uti She is unable to provide a sample here today - will bring in a sample next week - she does not appear to be sick and this may be more chronic so next week is ok No abx until we get the culture resutls

## 2021-10-10 NOTE — Assessment & Plan Note (Signed)
Chronic BP well controlled Continue losartan 25 mg daily, metoprolol 25 mg bid cmp

## 2021-10-11 MED ORDER — GLIMEPIRIDE 1 MG PO TABS: 1.0000 mg | ORAL_TABLET | Freq: Every day | ORAL | 1 refills | Status: DC

## 2021-10-11 MED ORDER — LEVOTHYROXINE SODIUM 125 MCG PO TABS: 125.0000 ug | ORAL_TABLET | Freq: Every day | ORAL | 3 refills | Status: DC

## 2021-10-11 NOTE — Addendum Note (Signed)
Addended by: Binnie Rail on: 10/11/2021 04:11 PM   Modules accepted: Orders

## 2021-10-15 ENCOUNTER — Telehealth: Payer: Self-pay | Admitting: Pharmacist

## 2021-10-15 NOTE — Progress Notes (Signed)
    Chronic Care Management Pharmacy Assistant   Name: Janet Steele  MRN: [id number] DOB: January 30, 1929    Reason for Encounter: Medication Review   Recent office visits:  10/09/21 Binnie Rail, MD-PCP (Urinary frequency) Labs ordered, med changes: glimepiride (AMARYL) 1 MG tablet  Recent consult visits:  None ID  Hospital visits:  None in previous 6 months  Medications: Outpatient Encounter Medications as of 10/15/2021  Medication Sig   atorvastatin (LIPITOR) 80 MG tablet TAKE ONE TABLET BY MOUTH EVERYDAY AT BEDTIME   clopidogrel (PLAVIX) 75 MG tablet Take 1 tablet (75 mg total) by mouth daily.   DULoxetine (CYMBALTA) 60 MG capsule TAKE 1 CAPSULE(60 MG) BY MOUTH DAILY   glimepiride (AMARYL) 1 MG tablet Take 1 tablet (1 mg total) by mouth daily with breakfast.   levothyroxine (SYNTHROID) 125 MCG tablet Take 1 tablet (125 mcg total) by mouth daily.   losartan (COZAAR) 25 MG tablet    metoprolol tartrate (LOPRESSOR) 25 MG tablet Take 1 tablet (25 mg total) by mouth 2 (two) times daily.   nitroGLYCERIN (NITROSTAT) 0.4 MG SL tablet Place 1 tablet (0.4 mg total) under the tongue every 5 (five) minutes as needed. For chest pain   pantoprazole (PROTONIX) 40 MG tablet TAKE ONE TABLET BY MOUTH EVERY MORNING   triamcinolone cream (KENALOG) 0.1 %    No facility-administered encounter medications on file as of 10/15/2021.    BP Readings from Last 3 Encounters:  10/09/21 102/72  08/26/21 104/72  02/24/21 106/78    Lab Results  Component Value Date   HGBA1C 8.0 (H) 10/09/2021      Last adherence delivery date:09/21/21      Patient is due for next adherence delivery on: 10/27/21  Spoke with patient on 10/16/21 reviewed medications and coordinated delivery.  This delivery to include: Vials  30 Days  Clopidogrel 75 mg 1 tab daily Pantoprazole 40 mg 1 tab daily Metoprolol 25 mg 1 tab bid Atorvastatin 80 mg 1 tab daily Levothyroxine 125 mcg daily Duloxetine 60 mg daily Aspirin  81 mg Glimepiride 1 mg 1 tab daily  Patient declined the following medications this month:    Any concerns about your medications? No  How often do you forget or accidentally miss a dose? Rarely  Do you use a pillbox? Yes  Is patient in packaging No     No refill request needed.  Confirmed delivery date of 10/27/21, advised patient that pharmacy will contact them the morning of delivery.  Recent blood pressure readings are as follows:NA   Recent blood glucose readings are as follows:NA   Annual wellness visit in last year? No Most Recent BP reading:102/72-10/09/21  If Diabetic: Most recent A1C reading:10/09/21 Last eye exam / retinopathy screening:01/08/21 Last diabetic foot exam: 11/03/20  Ethelene Hal Clinical Pharmacist Assistant [phone]

## 2021-10-16 DIAGNOSIS — R350 Frequency of micturition: Secondary | ICD-10-CM | POA: Diagnosis not present

## 2021-10-16 LAB — URINALYSIS, ROUTINE W REFLEX MICROSCOPIC
Bilirubin Urine: NEGATIVE
Ketones, ur: NEGATIVE
Nitrite: NEGATIVE
Specific Gravity, Urine: 1.015 (ref 1.000–1.030)
Urine Glucose: NEGATIVE
Urobilinogen, UA: 0.2 (ref 0.0–1.0)
pH: 6 (ref 5.0–8.0)

## 2021-10-16 NOTE — Addendum Note (Signed)
Addended by: Jacobo Forest on: 10/16/2021 01:56 PM   Modules accepted: Orders

## 2021-10-18 LAB — URINE CULTURE

## 2021-10-18 MED ORDER — CEPHALEXIN 500 MG PO CAPS: 500.0000 mg | ORAL_CAPSULE | Freq: Two times a day (BID) | ORAL | 0 refills | Status: DC

## 2021-10-18 NOTE — Addendum Note (Signed)
Addended by: Binnie Rail on: 10/18/2021 04:09 PM   Modules accepted: Orders

## 2021-10-23 ENCOUNTER — Telehealth: Payer: Self-pay | Admitting: Internal Medicine

## 2021-10-23 NOTE — Telephone Encounter (Signed)
Patient calling in  Says she is experiencing a lot of acid reflux.. cna suggest patient take prevacid  Wants to know is this okay to take w/ her current medications  Please call [phone]

## 2021-10-23 NOTE — Telephone Encounter (Signed)
Spoke with Mr. Kadow today. Advised him to make sure patient is taking her Protonix. If she is and it is not working to call us back and let us know.

## 2021-11-17 ENCOUNTER — Other Ambulatory Visit: Payer: Self-pay | Admitting: Internal Medicine

## 2021-11-19 ENCOUNTER — Telehealth: Payer: Self-pay | Admitting: Internal Medicine

## 2021-11-19 NOTE — Telephone Encounter (Signed)
1.Medication Requested: cephALEXin (KEFLEX) 500 MG capsule  2. Pharmacy (Name, Street, [place]): [facility] [phone] - [place], [place] [address]  3. On Med List: yes  4. Last Visit with PCP: 10-09-2021  5. Next visit date with PCP: 04-09-2022   Agent: Please be advised that RX refills may take up to 3 business days. We ask that you follow-up with your pharmacy.

## 2021-11-20 ENCOUNTER — Other Ambulatory Visit: Payer: Self-pay | Admitting: Internal Medicine

## 2021-11-20 NOTE — Telephone Encounter (Signed)
Medication has been refused.  Treatment has been completed

## 2021-12-08 ENCOUNTER — Other Ambulatory Visit: Payer: Self-pay | Admitting: Internal Medicine

## 2021-12-15 ENCOUNTER — Telehealth: Payer: Self-pay | Admitting: Internal Medicine

## 2021-12-15 NOTE — Telephone Encounter (Signed)
Patient's son [name] requesting a call back to discuss a home health care plan for patient  Please call [phone]

## 2021-12-17 ENCOUNTER — Ambulatory Visit: Payer: Medicare Other | Admitting: Nurse Practitioner

## 2021-12-17 NOTE — Telephone Encounter (Signed)
He can call her insurance company to see if they cover anything, but 99% of insurance companies do not cover home health care.  When patient needs physical therapy or a nurse to come in which are both short-term most insurances will cover home health aide during that time, but once the nurse or physical therapy is no longer needed they will not cover for home health aide.  If they have long-term insurance then it may be covered, but that is separate from their health insurance.

## 2021-12-18 NOTE — Telephone Encounter (Signed)
Spoke with son today.  He will check with insurance first to see what is covered and get back with [place].

## 2021-12-21 ENCOUNTER — Telehealth: Payer: Medicare Other

## 2021-12-22 ENCOUNTER — Other Ambulatory Visit: Payer: Self-pay | Admitting: Internal Medicine

## 2021-12-30 ENCOUNTER — Ambulatory Visit: Payer: Medicare Other | Admitting: Internal Medicine

## 2021-12-30 ENCOUNTER — Telehealth: Payer: Self-pay

## 2021-12-30 NOTE — Telephone Encounter (Signed)
Pt husband is calling in for a refill for: cephALEXin (KEFLEX) 500 MG capsule.  Pharmacy: [facility] [place], [place] - [place]  [place] 10/09/21

## 2021-12-31 NOTE — Telephone Encounter (Signed)
Spoke with patient's husband today. Refill no longer needed and he was not sure what it had been prescribed for.

## 2022-01-06 ENCOUNTER — Ambulatory Visit (INDEPENDENT_AMBULATORY_CARE_PROVIDER_SITE_OTHER): Payer: Medicare Other | Admitting: Gastroenterology

## 2022-01-06 ENCOUNTER — Encounter: Payer: Self-pay | Admitting: Gastroenterology

## 2022-01-06 VITALS — BP 110/60 | HR 64 | Ht 60.0 in | Wt 109.2 lb

## 2022-01-06 DIAGNOSIS — R1310 Dysphagia, unspecified: Secondary | ICD-10-CM | POA: Diagnosis not present

## 2022-01-06 DIAGNOSIS — Z8719 Personal history of other diseases of the digestive system: Secondary | ICD-10-CM | POA: Diagnosis not present

## 2022-01-06 NOTE — Patient Instructions (Signed)
If you are age 86 or older, your body mass index should be between 23-30. Your Body mass index is 21.34 kg/m. If this is out of the aforementioned range listed, please consider follow up with your Primary Care Provider. ________________________________________________________  The [facility] providers would like to encourage you to use [facility] to communicate with providers for non-urgent requests or questions.  Due to long hold times on the telephone, sending your provider a message by [facility] may be a faster and more efficient way to get a response.  Please allow 48 business hours for a response.  Please remember that this is for non-urgent requests.  _______________________________________________________  Janet Steele have been scheduled for a Barium Esophogram at [facility] (1st floor of the hospital) on 01/15/2022 at 11:00 am. Please arrive 15 minutes prior to your appointment for registration. Make certain not to have anything to eat or drink 3 hours prior to your test. If you need to reschedule for any reason, please contact radiology at [phone] to do so. __________________________________________________________________ A barium swallow is an examination that concentrates on views of the esophagus. This tends to be a double contrast exam (barium and two liquids which, when combined, create a gas to distend the wall of the oesophagus) or single contrast (non-ionic iodine based). The study is usually tailored to your symptoms so a good history is essential. Attention is paid during the study to the form, structure and configuration of the esophagus, looking for functional disorders (such as aspiration, dysphagia, achalasia, motility and reflux) EXAMINATION You may be asked to change into a gown, depending on the type of swallow being performed. A radiologist and radiographer will perform the procedure. The radiologist will advise you of the type of contrast selected for your procedure and  direct you during the exam. You will be asked to stand, sit or lie in several different positions and to hold a small amount of fluid in your mouth before being asked to swallow while the imaging is performed .In some instances you may be asked to swallow barium coated marshmallows to assess the motility of a solid food bolus. The exam can be recorded as a digital or video fluoroscopy procedure. POST PROCEDURE It will take 1-2 days for the barium to pass through your system. To facilitate this, it is important, unless otherwise directed, to increase your fluids for the next 24-48hrs and to resume your normal diet.  This test typically takes about 30 minutes to perform. ____________________________________________________________  Follow up pending the results of your Barium Swallow.  Thank you for entrusting me with your care and choosing [facility].  Alonza Bogus, PA-C

## 2022-01-06 NOTE — Progress Notes (Signed)
01/06/2022 Janet Steele [id number] 09/30/1929   HISTORY OF PRESENT ILLNESS:  This is a 88 year old female who is a patient of Dr. Blanch Media who presents here today with complaints of dysphagia.  She does have a history of esophageal stricture that was last dilated in August 2021.  She is very hard of hearing.  She is here today with her son and another friend or caregiver.  They all relay that they are not sure how much the previous dilation really helped her.  They think that she eats too fast, does not take drinks of water between bites, etc.  She seems to have a lot of drainage in the morning that could be playing into some her dysphagia.  Past Medical History:  Diagnosis Date   Anemia    Anxiety    Arthritis    "fingers" (05/19/2016   CAD (coronary artery disease)    a. s/p CABG 1982 b. redo CABG 2003. c. [place] despite med rx 05/2016: successful but complicated PCI of PDA beyond graft site, c/b localized dissection requiring overlapping stent.   Carotid bruit    a. Duplex 01/2015: patent vessels, 1-39% BICA, f/u PRN recommened.   Colon polyp    adenomatous   Colon, diverticulosis    Degenerative joint disease    Diastolic dysfunction    Esophageal stricture    GERD (gastroesophageal reflux disease)    Hiatal hernia    History of blood transfusion    "related to my bypass"   Hyperlipidemia    Hypertension    Hypothyroidism    Ischemic cardiomyopathy    a. Cath 05/2016: EF 40% with severe inferior wall HK, suspect hibernating myocardium.   Myocardial infarction ([place]) 1977; 1982   Osteoarthrosis, unspecified whether generalized or localized, unspecified site    Presence of permanent cardiac pacemaker    PVD (peripheral vascular disease) ([place])    Stroke ([place])    greater than 2 years ago per husband   Type II diabetes mellitus ([place])    Unspecified hearing loss    Past Surgical History:  Procedure Laterality Date   ABDOMINAL HYSTERECTOMY     APPENDECTOMY     BACK SURGERY      CARDIAC CATHETERIZATION N/A 05/19/2016   Procedure: Left Heart Cath and Cors/Grafts Angiography;  Surgeon: Belva Crome, MD;  Location: [place] [facility];  Service: Cardiovascular;  Laterality: N/A;   CARDIAC CATHETERIZATION N/A 05/19/2016   Procedure: Coronary Stent Intervention;  Surgeon: Belva Crome, MD;  Location: [place] [facility];  Service: Cardiovascular;  Laterality: N/A;   CATARACT EXTRACTION W/ INTRAOCULAR LENS  IMPLANT, BILATERAL Bilateral    CHOLECYSTECTOMY OPEN     COLONOSCOPY     CORONARY ANGIOPLASTY WITH STENT PLACEMENT  05/19/2016   "2 stents?"   CORONARY ARTERY BYPASS GRAFT  1982; 2003   x4(1982),02-2002 CABG X2   ESOPHAGOGASTRODUODENOSCOPY (EGD) WITH ESOPHAGEAL DILATION  "2-3 times"   INSERT / REPLACE / REMOVE PACEMAKER     LEFT HEART CATHETERIZATION WITH CORONARY/GRAFT ANGIOGRAM N/A 03/05/2014   Procedure: LEFT HEART CATHETERIZATION WITH Beatrix Fetters;  Surgeon: Sinclair Grooms, MD;  Location: [facility];  Service: Cardiovascular;  Laterality: N/A;   LUMBAR DISC SURGERY  01-2000   OOPHORECTOMY Bilateral    PERMANENT PACEMAKER INSERTION N/A 07/06/2012   MDT Adapta L implanted by Dr Rayann Heman for symptomatic bradycardia   TONSILLECTOMY  1930s   UPPER GASTROINTESTINAL ENDOSCOPY      reports that she quit smoking  about 45 years ago. Her smoking use included cigarettes. She has a 15.00 pack-year smoking history. She has never used smokeless tobacco. She reports that she does not drink alcohol and does not use drugs. family history includes Heart attack in her sister; Heart disease in her sister. Allergies  Allergen Reactions   Morphine Hives, Itching and Rash   Penicillins Itching and Rash    Has patient had a PCN reaction causing immediate rash, facial/tongue/throat swelling, SOB or lightheadedness with hypotension: Yes Has patient had a PCN reaction causing severe rash involving mucus membranes or skin necrosis: No Has patient had a PCN reaction that required  hospitalization No Has patient had a PCN reaction occurring within the last 10 years: No If all of the above answers are "NO", then may proceed with Cephalosporin use.    Prednisone Other (See Comments)    MEMORY LOSS    Sulfonamide Derivatives Itching and Rash   Codeine Rash   Hydrocodone-Acetaminophen Other (See Comments)    unknown   Meperidine Hcl Itching   Metoclopramide Hcl Itching and Rash   Metoprolol Succinate Other (See Comments)    nervous   Moxifloxacin Itching   Oxycodone-Aspirin Rash   Pentazocine Lactate Nausea Only   Pioglitazone Other (See Comments)    bloating      Outpatient Encounter Medications as of 01/06/2022  Medication Sig   aspirin EC 81 MG tablet Take 81 mg by mouth daily.   atorvastatin (LIPITOR) 80 MG tablet TAKE ONE TABLET BY MOUTH EVERYDAY AT BEDTIME   clopidogrel (PLAVIX) 75 MG tablet Take 1 tablet (75 mg total) by mouth daily.   DULoxetine (CYMBALTA) 60 MG capsule TAKE 1 CAPSULE(60 MG) BY MOUTH DAILY   glimepiride (AMARYL) 1 MG tablet Take 1 tablet (1 mg total) by mouth daily with breakfast.   levothyroxine (SYNTHROID) 125 MCG tablet Take 1 tablet (125 mcg total) by mouth daily.   metoprolol tartrate (LOPRESSOR) 25 MG tablet Take 1 tablet (25 mg total) by mouth 2 (two) times daily.   pantoprazole (PROTONIX) 40 MG tablet TAKE ONE TABLET BY MOUTH EVERY MORNING   triamcinolone cream (KENALOG) 0.1 %    losartan (COZAAR) 25 MG tablet TAKE 1 TABLET(25 MG) BY MOUTH DAILY (Patient not taking: Reported on 01/06/2022)   nitroGLYCERIN (NITROSTAT) 0.4 MG SL tablet Place 1 tablet (0.4 mg total) under the tongue every 5 (five) minutes as needed. For chest pain (Patient not taking: Reported on 01/06/2022)   [DISCONTINUED] cephALEXin (KEFLEX) 500 MG capsule Take 1 capsule (500 mg total) by mouth 2 (two) times daily.   No facility-administered encounter medications on file as of 01/06/2022.     REVIEW OF SYSTEMS  : All other systems reviewed and negative except where  noted in the History of Present Illness.   PHYSICAL EXAM: BP 110/60 (BP Location: Left Arm, Patient Position: Sitting, Cuff Size: Normal)    Pulse 64    Ht 5' (1.524 m)    Wt 109 lb 4 oz (49.6 kg)    BMI 21.34 kg/m  General: Elderly white female in no acute distress Head: Normocephalic and atraumatic Eyes:  Sclerae anicteric, conjunctiva pink. Ears: Normal auditory acuity Lungs: Clear throughout to auscultation; no W/R/R. Heart: Regular rate and rhythm; no M/R/G. Abdomen: Soft, non-distended.  BS present.  Non-tender. Musculoskeletal: Symmetrical with no gross deformities  Skin: No lesions on visible extremities Extremities: No edema  Neurological: Alert oriented x 4, grossly non-focal Psychological:  Alert and cooperative. Normal mood and affect  ASSESSMENT  AND PLAN: *88 year old female with complaints of dysphagia.  She does have a history of esophageal stricture that was last dilated in August 2021.  She is very hard of hearing.  She is here today with her son and another friend or caregiver.  They all relay that they are not sure how much the previous dilation really helped her.  They think that she eats too fast, does not take drinks of water between bites, etc.  She seems to have a lot of drainage in the morning that could be playing into some her dysphagia.  We discussed that we could certainly perform an endoscopy with dilation if we think that would significantly help her symptoms, but being that they are not sure that it really helped much last time and that there may be some other components to her dysphagia we decided to proceed with an esophagram first to check for dysmotility and to further evaluate for stricture, etc.  They are going to also try some Mucinex and some other regimens for her drainage/postnasal drip.  I encouraged her to be sure that she eat slowly, chews her food well, etc.   CC:  Burns, Claudina Lick, MD

## 2022-01-07 NOTE — Progress Notes (Signed)
Noted  

## 2022-01-15 ENCOUNTER — Ambulatory Visit (HOSPITAL_COMMUNITY)
Admission: RE | Admit: 2022-01-15 | Discharge: 2022-01-15 | Disposition: A | Discharge status: Home / Self Care | Payer: Medicare Other | Source: Ambulatory Visit | Attending: Gastroenterology | Admitting: Gastroenterology

## 2022-01-15 ENCOUNTER — Other Ambulatory Visit: Payer: Self-pay | Admitting: Gastroenterology

## 2022-01-15 ENCOUNTER — Other Ambulatory Visit: Payer: Self-pay

## 2022-01-15 DIAGNOSIS — Z8719 Personal history of other diseases of the digestive system: Secondary | ICD-10-CM

## 2022-01-15 DIAGNOSIS — R1310 Dysphagia, unspecified: Secondary | ICD-10-CM | POA: Insufficient documentation

## 2022-01-15 DIAGNOSIS — K224 Dyskinesia of esophagus: Secondary | ICD-10-CM | POA: Diagnosis not present

## 2022-01-18 ENCOUNTER — Other Ambulatory Visit: Payer: Self-pay

## 2022-01-18 ENCOUNTER — Telehealth: Payer: Self-pay | Admitting: Gastroenterology

## 2022-01-18 DIAGNOSIS — R1310 Dysphagia, unspecified: Secondary | ICD-10-CM

## 2022-01-18 NOTE — Telephone Encounter (Signed)
Patient called requesting the results of the swallow test she had done last week.  Please call.  Thank you.

## 2022-01-18 NOTE — Telephone Encounter (Signed)
I have made the pt aware that we will call her as soon as the results are reviewed. The pt has been advised of the information and verbalized understanding.

## 2022-01-19 ENCOUNTER — Ambulatory Visit (INDEPENDENT_AMBULATORY_CARE_PROVIDER_SITE_OTHER): Payer: Medicare Other | Admitting: Internal Medicine

## 2022-01-19 ENCOUNTER — Encounter: Payer: Self-pay | Admitting: Internal Medicine

## 2022-01-19 ENCOUNTER — Other Ambulatory Visit: Payer: Self-pay

## 2022-01-19 DIAGNOSIS — H66002 Acute suppurative otitis media without spontaneous rupture of ear drum, left ear: Secondary | ICD-10-CM | POA: Diagnosis not present

## 2022-01-19 MED ORDER — NEOMYCIN-POLYMYXIN-HC 3.5-10000-1 OT SUSP: 3.0000 [drp] | Freq: Three times a day (TID) | OTIC | 0 refills | Status: AC

## 2022-01-19 NOTE — Progress Notes (Signed)
° °  Subjective:   Patient ID: Janet Steele, female    DOB: Mar 13, 1929, 88 y.o.   MRN: [id number]  HPI The patient is a 88 YO female coming in for left ear pain.   Review of Systems  Constitutional:  Positive for activity change. Negative for fatigue, fever and unexpected weight change.  HENT:  Positive for ear pain. Negative for ear discharge, sinus pain, sneezing, sore throat, tinnitus, trouble swallowing and voice change.   Eyes: Negative.   Respiratory:  Negative for cough, chest tightness, shortness of breath and wheezing.   Cardiovascular: Negative.  Negative for chest pain, palpitations and leg swelling.  Gastrointestinal: Negative.  Negative for abdominal distention, abdominal pain, constipation, diarrhea, nausea and vomiting.  Musculoskeletal: Negative.   Skin: Negative.   Neurological: Negative.   Psychiatric/Behavioral: Negative.     Objective:  Physical Exam Constitutional:      Appearance: She is well-developed.  HENT:     Head: Normocephalic and atraumatic.     Ears:     Comments: Left ear TM bulging cloudy fluid, right ear TM bulging clear fluid Cardiovascular:     Rate and Rhythm: Normal rate and regular rhythm.  Pulmonary:     Effort: Pulmonary effort is normal. No respiratory distress.     Breath sounds: Normal breath sounds. No wheezing or rales.  Abdominal:     General: Bowel sounds are normal. There is no distension.     Palpations: Abdomen is soft.     Tenderness: There is no abdominal tenderness. There is no rebound.  Musculoskeletal:     Cervical back: Normal range of motion.  Skin:    General: Skin is warm and dry.  Neurological:     Mental Status: She is alert and oriented to person, place, and time.     Coordination: Coordination normal.    Vitals:   01/19/22 1515  BP: 124/70  Pulse: 70  Resp: 18  SpO2: 90%  Height: 5' (1.524 m)    This visit occurred during the SARS-CoV-2 public health emergency.  Safety protocols were in place,  including screening questions prior to the visit, additional usage of staff PPE, and extensive cleaning of exam room while observing appropriate contact time as indicated for disinfecting solutions.   Assessment & Plan:

## 2022-01-19 NOTE — Patient Instructions (Signed)
We have sent in the ear drops to use 3 drops 3 times for 3 days to clear the fluid in the ear. This will likely go away on its own.

## 2022-01-21 ENCOUNTER — Other Ambulatory Visit (HOSPITAL_COMMUNITY): Payer: Self-pay

## 2022-01-21 DIAGNOSIS — R1310 Dysphagia, unspecified: Secondary | ICD-10-CM

## 2022-01-21 DIAGNOSIS — R059 Cough, unspecified: Secondary | ICD-10-CM

## 2022-01-22 ENCOUNTER — Encounter: Payer: Self-pay | Admitting: Internal Medicine

## 2022-01-22 DIAGNOSIS — H6690 Otitis media, unspecified, unspecified ear: Secondary | ICD-10-CM | POA: Insufficient documentation

## 2022-01-22 NOTE — Assessment & Plan Note (Signed)
Rx cortisporin ear drops bilateral to resolve infection in left and likely evolving in the right.

## 2022-01-23 NOTE — Progress Notes (Signed)
Subjective:    Patient ID: Janet Steele, female    DOB: 03/10/1929, 88 y.o.   MRN: [id number]  This visit occurred during the SARS-CoV-2 public health emergency.  Safety protocols were in place, including screening questions prior to the visit, additional usage of staff PPE, and extensive cleaning of exam room while observing appropriate contact time as indicated for disinfecting solutions.    HPI The patient is here for an acute visit.  She is here today with her son and a family friend.   Cough, persistent - ? Related to dysphasia -- anytime she eats she chokes and has coughing fits. She eats too fast and dry foods are very difficult to eat.  She frequently starts coughing and vomits at times.  It has been going on forever.  Family encourages her to drink liquids while eating, but she does not always comply.  She does not always comply with taking her medications.  They have been trying to get her to take her pills in applesauce, but she sometimes refuses.   Stomach pain -she is complaining a couple of times about stomach pain-her friend Janet Steele thinks is probably related to the coughing.  That seems to have resolved and there has not been any recent complaints of stomach pain and she denies any current stomach pain  Her family was thinking about crushing her pills-advised they need to discuss that with the pharmacist in order to see what pills can be crushed and not crushed.  She is not compliant with a diabetic diet.  Her husband does the shopping and tends to get her anything she wants.  He also has some mild dementia.  Janet Steele is a family friend and she is coming in twice a week to help.  They have applied for [employer] benefits, which will hopefully allow for more support.   Medications and allergies reviewed with patient and updated if appropriate.  Patient Active Problem List   Diagnosis Date Noted   Otitis media 01/22/2022   History of esophageal stricture 01/06/2022   Aortic  atherosclerosis ([place]) 11/03/2020   CKD (chronic kidney disease) stage 3, GFR 30-59 ml/min (HCC) 11/02/2020   Urinary frequency 10/17/2020   Poor balance 04/29/2020   Physical deconditioning 04/29/2020   Cold extremities 02/19/2020   Colitis 01/15/2020   Headache 07/21/2019   Other headache syndrome 08/14/2018   Depression 08/14/2018   Stroke ([place]) 11/04/2017   Weakness 11/03/2017   History of cerebrovascular accident (CVA) with residual deficit 11/03/2017   Chronic back pain 07/04/2017   PAD (peripheral artery disease) ([place]) 01/17/2017   Overactive bladder 12/17/2016   Anemia 05/20/2016   Coronary artery disease involving coronary bypass graft of native heart without angina pectoris 05/19/2016   MCI (mild cognitive impairment) with memory loss 11/18/2015   Facial tic 05/07/2015   Sick sinus syndrome ([place]) 02/26/2015   Cervical dystonia 12/04/2014   Pacemaker-Medtronic 07/07/2012   GERD (gastroesophageal reflux disease) 07/05/2012   Cough 08/04/2010   Abnormal involuntary movement 12/17/2009   CAD, ARTERY BYPASS GRAFT 09/24/2009   CAROTID BRUIT 09/24/2009   Diabetes ([place]) 01/30/2009   Dysphagia 06/19/2008   Anxiety 02/14/2008   EROSIVE ESOPHAGITIS 02/14/2008   CONSTIPATION, CHRONIC 02/14/2008   DEGENERATIVE JOINT DISEASE 02/14/2008   HEARING LOSS 01/04/2008   PVD 01/04/2008   Hypothyroidism 09/25/2007   HYPERLIPIDEMIA 09/25/2007   Essential hypertension 09/07/2007   Diabetic polyneuropathy ([place]) 03/16/2007   Stricture and stenosis of esophagus 03/16/2007   HIATAL HERNIA 02/16/2007  HEMORRHOIDS 12/31/1997   DIVERTICULOSIS, COLON 12/31/1997    Current Outpatient Medications on File Prior to Visit  Medication Sig Dispense Refill   aspirin EC 81 MG tablet Take 81 mg by mouth daily.     atorvastatin (LIPITOR) 80 MG tablet TAKE ONE TABLET BY MOUTH EVERYDAY AT BEDTIME 90 tablet 1   clopidogrel (PLAVIX) 75 MG tablet Take 1 tablet (75 mg total) by mouth daily. 90 tablet 2    DULoxetine (CYMBALTA) 60 MG capsule TAKE 1 CAPSULE(60 MG) BY MOUTH DAILY 90 capsule 1   glimepiride (AMARYL) 1 MG tablet Take 1 tablet (1 mg total) by mouth daily with breakfast. 90 tablet 1   levothyroxine (SYNTHROID) 125 MCG tablet Take 1 tablet (125 mcg total) by mouth daily. 90 tablet 3   losartan (COZAAR) 25 MG tablet TAKE 1 TABLET(25 MG) BY MOUTH DAILY 90 tablet 0   metoprolol tartrate (LOPRESSOR) 25 MG tablet Take 1 tablet (25 mg total) by mouth 2 (two) times daily. 180 tablet 1   nitroGLYCERIN (NITROSTAT) 0.4 MG SL tablet Place 1 tablet (0.4 mg total) under the tongue every 5 (five) minutes as needed. For chest pain 25 tablet 5   pantoprazole (PROTONIX) 40 MG tablet TAKE ONE TABLET BY MOUTH EVERY MORNING 90 tablet 1   triamcinolone cream (KENALOG) 0.1 %      No current facility-administered medications on file prior to visit.    Past Medical History:  Diagnosis Date   Anemia    Anxiety    Arthritis    "fingers" (05/19/2016   CAD (coronary artery disease)    a. s/p CABG 1982 b. redo CABG 2003. c. [place] despite med rx 05/2016: successful but complicated PCI of PDA beyond graft site, c/b localized dissection requiring overlapping stent.   Carotid bruit    a. Duplex 01/2015: patent vessels, 1-39% BICA, f/u PRN recommened.   Colon polyp    adenomatous   Colon, diverticulosis    Degenerative joint disease    Diastolic dysfunction    Esophageal stricture    GERD (gastroesophageal reflux disease)    Hiatal hernia    History of blood transfusion    "related to my bypass"   Hyperlipidemia    Hypertension    Hypothyroidism    Ischemic cardiomyopathy    a. Cath 05/2016: EF 40% with severe inferior wall HK, suspect hibernating myocardium.   Myocardial infarction ([place]) 1977; 1982   Osteoarthrosis, unspecified whether generalized or localized, unspecified site    Presence of permanent cardiac pacemaker    PVD (peripheral vascular disease) ([place])    Stroke ([place])    greater than 2 years ago  per husband   Type II diabetes mellitus ([place])    Unspecified hearing loss     Past Surgical History:  Procedure Laterality Date   ABDOMINAL HYSTERECTOMY     APPENDECTOMY     BACK SURGERY     CARDIAC CATHETERIZATION N/A 05/19/2016   Procedure: Left Heart Cath and Cors/Grafts Angiography;  Surgeon: Belva Crome, MD;  Location: [facility];  Service: Cardiovascular;  Laterality: N/A;   CARDIAC CATHETERIZATION N/A 05/19/2016   Procedure: Coronary Stent Intervention;  Surgeon: Belva Crome, MD;  Location: [facility];  Service: Cardiovascular;  Laterality: N/A;   CATARACT EXTRACTION W/ INTRAOCULAR LENS  IMPLANT, BILATERAL Bilateral    CHOLECYSTECTOMY OPEN     COLONOSCOPY     CORONARY ANGIOPLASTY WITH STENT PLACEMENT  05/19/2016   "2 stents?"   CORONARY ARTERY BYPASS  GRAFT  1982; 2003   x4(1982),02-2002 CABG X2   ESOPHAGOGASTRODUODENOSCOPY (EGD) WITH ESOPHAGEAL DILATION  "2-3 times"   INSERT / REPLACE / REMOVE PACEMAKER     LEFT HEART CATHETERIZATION WITH CORONARY/GRAFT ANGIOGRAM N/A 03/05/2014   Procedure: LEFT HEART CATHETERIZATION WITH Beatrix Fetters;  Surgeon: Sinclair Grooms, MD;  Location: [facility];  Service: Cardiovascular;  Laterality: N/A;   LUMBAR DISC SURGERY  01-2000   OOPHORECTOMY Bilateral    PERMANENT PACEMAKER INSERTION N/A 07/06/2012   MDT Adapta L implanted by Dr Rayann Heman for symptomatic bradycardia   TONSILLECTOMY  1930s   UPPER GASTROINTESTINAL ENDOSCOPY      Social History   Socioeconomic History   Marital status: Married    Spouse name: [name]   Number of children: 3   Years of education: 16   Highest education level: Not on file  Occupational History   Occupation: Retired     Fish farm manager: RETIRED  Tobacco Use   Smoking status: Former    Packs/day: 0.50    Years: 30.00    Pack years: 15.00    Types: Cigarettes    Quit date: 07/05/1976    Years since quitting: 45.5   Smokeless tobacco: Never  Vaping Use   Vaping Use: Never used   Substance and Sexual Activity   Alcohol use: No    Alcohol/week: 0.0 standard drinks    Comment: "not anymore, I haven't in I don't know years"   Drug use: No   Sexual activity: Never  Other Topics Concern   Not on file  Social History Narrative   Patient is married [name]) and lives at home with her husband.   Patient has three adult children.   Patient is retired.   Patient has a college education.   Patient is right-handed.   Patient does not drink any caffeine.   Social Determinants of Radio broadcast assistant Strain: Not on file  Food Insecurity: Not on file  Transportation Needs: Not on file  Physical Activity: Not on file  Stress: Not on file  Social Connections: Not on file    Family History  Problem Relation Age of Onset   Heart disease Sister    Heart attack Sister    Colon cancer Neg Hx    Breast cancer Neg Hx    Celiac disease Neg Hx    Cirrhosis Neg Hx    Clotting disorder Neg Hx    Colitis Neg Hx    Colon polyps Neg Hx    Crohn's disease Neg Hx    Cystic fibrosis Neg Hx    Diabetes Neg Hx    Esophageal cancer Neg Hx    Hemochromatosis Neg Hx    Inflammatory bowel disease Neg Hx    Irritable bowel syndrome Neg Hx    Kidney disease Neg Hx    Liver cancer Neg Hx    Liver disease Neg Hx    Ovarian cancer Neg Hx    Pancreatic cancer Neg Hx    Prostate cancer Neg Hx    Rectal cancer Neg Hx    Stomach cancer Neg Hx    Ulcerative colitis Neg Hx    Uterine cancer Neg Hx    Wilson's disease Neg Hx     Review of Systems  Constitutional:  Positive for fatigue.  Respiratory:  Positive for cough (after eating). Negative for shortness of breath and wheezing.   Cardiovascular:  Negative for chest pain and leg swelling.      Objective:  Vitals:   01/25/22 1115  BP: 130/80  Pulse: (!) 53  Temp: 98.2 F (36.8 C)  SpO2: 97%   BP Readings from Last 3 Encounters:  01/25/22 130/80  01/19/22 124/70  01/06/22 110/60   Wt Readings from Last 3  Encounters:  01/06/22 109 lb 4 oz (49.6 kg)  12/16/20 116 lb 12.8 oz (53 kg)  11/24/20 117 lb (53.1 kg)   Body mass index is 21.34 kg/m.   Physical Exam    Constitutional: Appears well-developed and well-nourished. No distress.  Head: Normocephalic and atraumatic.  Neck: Neck supple. No tracheal deviation present. No thyromegaly present.  No cervical lymphadenopathy Cardiovascular: Normal rate, regular rhythm and normal heart sounds.  No murmur heard. No carotid bruit .  No edema Pulmonary/Chest: Effort normal and breath sounds normal. No respiratory distress. No has no wheezes. No rales.  Skin: Skin is warm and dry. Not diaphoretic.  Psychiatric: Normal mood and affect. Behavior is normal.       Assessment & Plan:    See Problem List for Assessment and Plan of chronic medical problems.

## 2022-01-25 ENCOUNTER — Other Ambulatory Visit: Payer: Self-pay

## 2022-01-25 ENCOUNTER — Encounter: Payer: Self-pay | Admitting: Internal Medicine

## 2022-01-25 ENCOUNTER — Ambulatory Visit (INDEPENDENT_AMBULATORY_CARE_PROVIDER_SITE_OTHER): Payer: Medicare Other | Admitting: Internal Medicine

## 2022-01-25 VITALS — BP 130/80 | HR 53 | Temp 98.2°F | Ht 60.0 in

## 2022-01-25 DIAGNOSIS — R053 Chronic cough: Secondary | ICD-10-CM | POA: Diagnosis not present

## 2022-01-25 DIAGNOSIS — E1165 Type 2 diabetes mellitus with hyperglycemia: Secondary | ICD-10-CM | POA: Diagnosis not present

## 2022-01-25 DIAGNOSIS — I10 Essential (primary) hypertension: Secondary | ICD-10-CM

## 2022-01-25 DIAGNOSIS — N1832 Chronic kidney disease, stage 3b: Secondary | ICD-10-CM

## 2022-01-25 DIAGNOSIS — E039 Hypothyroidism, unspecified: Secondary | ICD-10-CM

## 2022-01-25 DIAGNOSIS — E782 Mixed hyperlipidemia: Secondary | ICD-10-CM

## 2022-01-25 LAB — COMPREHENSIVE METABOLIC PANEL
ALT: 10 U/L (ref 0–35)
AST: 18 U/L (ref 0–37)
Albumin: 4.2 g/dL (ref 3.5–5.2)
Alkaline Phosphatase: 116 U/L (ref 39–117)
BUN: 27 mg/dL — ABNORMAL HIGH (ref 6–23)
CO2: 26 mEq/L (ref 19–32)
Calcium: 9.4 mg/dL (ref 8.4–10.5)
Chloride: 105 mEq/L (ref 96–112)
Creatinine, Ser: 1.4 mg/dL — ABNORMAL HIGH (ref 0.40–1.20)
GFR: 33.17 mL/min — ABNORMAL LOW (ref >60.00)
Glucose, Bld: 155 mg/dL — ABNORMAL HIGH (ref 70–99)
Potassium: 4.9 mEq/L (ref 3.5–5.1)
Sodium: 135 mEq/L (ref 135–145)
Total Bilirubin: 1 mg/dL (ref 0.2–1.2)
Total Protein: 7.5 g/dL (ref 6.0–8.3)

## 2022-01-25 LAB — TSH: TSH: 1.9 u[IU]/mL (ref 0.35–5.50)

## 2022-01-25 LAB — CBC WITH DIFFERENTIAL/PLATELET
Basophils Absolute: 0 10*3/uL (ref 0.0–0.1)
Basophils Relative: 0.7 % (ref 0.0–3.0)
Eosinophils Absolute: 0.1 10*3/uL (ref 0.0–0.7)
Eosinophils Relative: 1.8 % (ref 0.0–5.0)
HCT: 34.8 % — ABNORMAL LOW (ref 36.0–46.0)
Hemoglobin: 11.2 g/dL — ABNORMAL LOW (ref 12.0–15.0)
Lymphocytes Relative: 21.5 % (ref 12.0–46.0)
Lymphs Abs: 1.2 10*3/uL (ref 0.7–4.0)
MCHC: 32.3 g/dL (ref 30.0–36.0)
MCV: 78.6 fl (ref 78.0–100.0)
Monocytes Absolute: 0.4 10*3/uL (ref 0.1–1.0)
Monocytes Relative: 6.6 % (ref 3.0–12.0)
Neutro Abs: 3.7 10*3/uL (ref 1.4–7.7)
Neutrophils Relative %: 69.4 % (ref 43.0–77.0)
Platelets: 337 10*3/uL (ref 150.0–400.0)
RBC: 4.42 Mil/uL (ref 3.87–5.11)
RDW: 16.3 % — ABNORMAL HIGH (ref 11.5–15.5)
WBC: 5.4 10*3/uL (ref 4.0–10.5)

## 2022-01-25 LAB — HEMOGLOBIN A1C: Hgb A1c MFr Bld: 7.2 % — ABNORMAL HIGH (ref 4.6–6.5)

## 2022-01-25 NOTE — Assessment & Plan Note (Signed)
Chronic Not compliant with medication Check TSH Currently should be taking levothyroxine 125 mcg daily

## 2022-01-25 NOTE — Assessment & Plan Note (Signed)
Chronic Continue atorvastatin 80 mg daily 

## 2022-01-25 NOTE — Assessment & Plan Note (Signed)
Chronic Has been stable CMP

## 2022-01-25 NOTE — Assessment & Plan Note (Signed)
Chronic Not well controlled She is not compliant with a diabetic diet and with the dementia it may be difficult to enforce-her family is trying to encourage her husband not to bring in anything she should not be eating Do not think she could do insulin and her husband cannot help with that.  I do worry about suppressing her appetite too much with some of the newer diabetic medications Currently on glimepiride 1 mg daily with breakfast Could consider low-dose Rybelsus or weekly injection Check A1c today

## 2022-01-25 NOTE — Assessment & Plan Note (Signed)
Chronic Tends to come with eating and likely related to her dysphagia Seeing GI and having work-up at this time-has modified barium swallow scheduled for 3/2 Her family has encouraged her and her husband to change her diet, but they are not always compliant Hopefully having this test and having more direction from speech therapy will help Lungs clear on exam Will await results of modified barium study

## 2022-01-25 NOTE — Assessment & Plan Note (Signed)
Chronic Blood pressure well controlled CMP Continue losartan 25 mg daily, metoprolol 25 mg twice daily-not sure how much she is actually taking of these medications-BP looks good for now so no changes

## 2022-01-25 NOTE — Patient Instructions (Addendum)
° ° ° °  Blood work was ordered.     Medications changes include :   none    Return in about 6 months (around 07/25/2022) for follow up.

## 2022-02-02 ENCOUNTER — Ambulatory Visit (INDEPENDENT_AMBULATORY_CARE_PROVIDER_SITE_OTHER): Payer: Medicare Other

## 2022-02-02 DIAGNOSIS — E039 Hypothyroidism, unspecified: Secondary | ICD-10-CM

## 2022-02-02 DIAGNOSIS — E785 Hyperlipidemia, unspecified: Secondary | ICD-10-CM

## 2022-02-02 DIAGNOSIS — E782 Mixed hyperlipidemia: Secondary | ICD-10-CM

## 2022-02-02 DIAGNOSIS — E1122 Type 2 diabetes mellitus with diabetic chronic kidney disease: Secondary | ICD-10-CM | POA: Diagnosis not present

## 2022-02-02 DIAGNOSIS — N184 Chronic kidney disease, stage 4 (severe): Secondary | ICD-10-CM

## 2022-02-02 DIAGNOSIS — I10 Essential (primary) hypertension: Secondary | ICD-10-CM

## 2022-02-02 DIAGNOSIS — E1165 Type 2 diabetes mellitus with hyperglycemia: Secondary | ICD-10-CM

## 2022-02-02 DIAGNOSIS — I129 Hypertensive chronic kidney disease with stage 1 through stage 4 chronic kidney disease, or unspecified chronic kidney disease: Secondary | ICD-10-CM | POA: Diagnosis not present

## 2022-02-02 DIAGNOSIS — Z7984 Long term (current) use of oral hypoglycemic drugs: Secondary | ICD-10-CM

## 2022-02-02 NOTE — Progress Notes (Signed)
Chronic Care Management Pharmacy Note  02/02/2022 Name:  Janet Steele MRN:  [id number] DOB:  03/04/1929  Summary: -spoke with patient's son Coralyn Mark and her husband who are the main caregivers to the patient -Had questioned about which medications could be crushed due to trouble swallowing her medications at times  -Nurse monitors blood pressures at home, has been well controlled as of late -Not checking BG at this time, but A1c has improved since restarting glimepiride - noted that diet is not the best, likely a contributing factor in issues with BG control  Recommendations/Changes made from today's visit: -Advised that patient can crush losartan, atorvastatin, and clopidogrel tablets if needed - levothyroxine can be crushed but would recommend against if possible to ensure patient is getting full dose of medication as it is a narrow therapeutic index drug -Advised for patient not to crush glimepiride,ASA, duloxetine, metoprolol tartrate, or pantoprazole  -Reviewed with patient dietary changes to help improve BG control, reviewed carbohydrate targets at meals / encouraged increase in lean protein / vegetable intake    Subjective: Janet Steele is an 88 y.o. year old female who is a primary patient of Burns, Claudina Lick, MD.  The CCM team was consulted for assistance with disease management and care coordination needs.    Engaged with patient by telephone for follow up visit in response to provider referral for pharmacy case management and/or care coordination services.   Consent to Services:  The patient was given information about Chronic Care Management services, agreed to services, and gave verbal consent prior to initiation of services.  Please see initial visit note for detailed documentation.   Patient Care Team: Binnie Rail, MD as PCP - General (Internal Medicine) Sherren Mocha, MD as PCP - Cardiology (Cardiology) Ward Givens, NP as Registered Nurse (Neurology) Irene Shipper, MD as Consulting Physician (Gastroenterology) Delice Bison, Darnelle Maffucci, [facility] (Pharmacist)   Patient lives at home with husband [name]. She has dementia and husband handles her medications. Patient stays in bed a lot. Her husband gives her her pills in bed, sometimes he finds pills on the floor - twice a week.   Recent office visits: 01/25/2022 - Dr. Quay Burow - no changes to medication - follow up in 6 months  01/19/2022 - Dr. Sharlet Salina - left ear pain - rx for cortisporin ear drops  10/09/2021 - Dr. Quay Burow - A1c elevated / urinary frequency - glimepiride restarted - cephalexin for UTI, levothyroxine dose adjusted  08/26/2021 - Dr. Quay Burow - acute cough x several weeks - z-pak and chest x-ray ordered    Recent consult visits: 01/06/2022 - Janett Billow Zehr PA-C - gastro - dysphagia - barium swallow ordered   Hospital visits: None in previous 6 months  Objective:  Lab Results  Component Value Date   CREATININE 1.40 (H) 01/25/2022   BUN 27 (H) 01/25/2022   GFR 33.17 (L) 01/25/2022   GFRNONAA 30 (L) 01/19/2020   GFRAA 35 (L) 01/19/2020   NA 135 01/25/2022   K 4.9 01/25/2022   CALCIUM 9.4 01/25/2022   CO2 26 01/25/2022   GLUCOSE 155 (H) 01/25/2022    Lab Results  Component Value Date/Time   HGBA1C 7.2 (H) 01/25/2022 12:09 PM   HGBA1C 8.0 (H) 10/09/2021 02:45 PM   GFR 33.17 (L) 01/25/2022 12:09 PM   GFR 32.14 (L) 10/09/2021 02:45 PM   MICROALBUR 3.6 (H) 05/28/2010 03:24 PM   MICROALBUR 2.1 (H) 07/18/2008 01:30 PM    Last diabetic Eye exam:  Lab Results  Component Value Date/Time   HMDIABEYEEXA No Retinopathy 01/08/2021 12:00 AM    Last diabetic Foot exam: No results found for: HMDIABFOOTEX   Lab Results  Component Value Date   CHOL 166 08/26/2021   HDL 34.40 (L) 08/26/2021   LDLCALC 101 (H) 08/26/2021   LDLDIRECT 254.0 11/01/2017   TRIG 153.0 (H) 08/26/2021   CHOLHDL 5 08/26/2021    Hepatic Function Latest Ref Rng & Units 01/25/2022 10/09/2021 08/26/2021  Total Protein 6.0 - 8.3 g/dL  7.5 7.2 7.7  Albumin 3.5 - 5.2 g/dL 4.2 4.0 4.2  AST 0 - 37 U/L '18 13 17  ' ALT 0 - 35 U/L '10 10 13  ' Alk Phosphatase 39 - 117 U/L 116 134(H) 123(H)  Total Bilirubin 0.2 - 1.2 mg/dL 1.0 0.7 1.0  Bilirubin, Direct 0.0 - 0.3 mg/dL - - -    Lab Results  Component Value Date/Time   TSH 1.90 01/25/2022 12:09 PM   TSH 0.16 (L) 10/09/2021 02:45 PM   FREET4 0.70 01/18/2020 08:25 AM    CBC Latest Ref Rng & Units 01/25/2022 10/09/2021 08/26/2021  WBC 4.0 - 10.5 K/uL 5.4 7.5 8.1  Hemoglobin 12.0 - 15.0 g/dL 11.2(L) 11.1(L) 11.3(L)  Hematocrit 36.0 - 46.0 % 34.8(L) 33.7(L) 34.0(L)  Platelets 150.0 - 400.0 K/uL 337.0 281.0 316.0    No results found for: VD25OH  Clinical ASCVD: Yes  The ASCVD Risk score (Arnett DK, et al., 2019) failed to calculate for the following reasons:   The 2019 ASCVD risk score is only valid for ages 70 to 68   The patient has a prior MI or stroke diagnosis    Depression screen [facility] 2/9 02/02/2022 01/19/2022 06/04/2020  Decreased Interest 3 0 0  Down, Depressed, Hopeless 3 0 0  PHQ - 2 Score 6 0 0  Altered sleeping 0 - -  Tired, decreased energy 0 - -  Change in appetite 1 - -  Feeling bad or failure about yourself  0 - -  Trouble concentrating 1 - -  Moving slowly or fidgety/restless 0 - -  Suicidal thoughts 0 - -  PHQ-9 Score 8 - -  Difficult doing work/chores Somewhat difficult - -  Some recent data might be hidden      Social History   Tobacco Use  Smoking Status Former   Packs/day: 0.50   Years: 30.00   Pack years: 15.00   Types: Cigarettes   Quit date: 07/05/1976   Years since quitting: 45.6  Smokeless Tobacco Never   BP Readings from Last 3 Encounters:  01/25/22 130/80  01/19/22 124/70  01/06/22 110/60   Pulse Readings from Last 3 Encounters:  01/25/22 (!) 53  01/19/22 70  01/06/22 64   Wt Readings from Last 3 Encounters:  01/06/22 109 lb 4 oz (49.6 kg)  12/16/20 116 lb 12.8 oz (53 kg)  11/24/20 117 lb (53.1 kg)   BMI Readings from Last  3 Encounters:  01/25/22 21.34 kg/m  01/19/22 21.34 kg/m  01/06/22 21.34 kg/m    Assessment/Interventions: Review of patient past medical history, allergies, medications, health status, including review of consultants reports, laboratory and other test data, was performed as part of comprehensive evaluation and provision of chronic care management services.   SDOH:  (Social Determinants of Health) assessments and interventions performed: Yes  SDOH Screenings   Alcohol Screen: Not on file  Depression (PHQ2-9): Medium Risk   PHQ-2 Score: 8  Financial Resource Strain: Not on file  Food Insecurity: Not on file  Housing: Not on file  Physical Activity: Not on file  Social Connections: Not on file  Stress: Not on file  Tobacco Use: Medium Risk   Smoking Tobacco Use: Former   Smokeless Tobacco Use: Never   Passive Exposure: Not on file  Transportation Needs: Not on file    CCM Care Plan  Allergies  Allergen Reactions   Morphine Hives, Itching and Rash   Penicillins Itching and Rash    Has patient had a PCN reaction causing immediate rash, facial/tongue/throat swelling, SOB or lightheadedness with hypotension: Yes Has patient had a PCN reaction causing severe rash involving mucus membranes or skin necrosis: No Has patient had a PCN reaction that required hospitalization No Has patient had a PCN reaction occurring within the last 10 years: No If all of the above answers are "NO", then may proceed with Cephalosporin use.    Prednisone Other (See Comments)    MEMORY LOSS    Sulfonamide Derivatives Itching and Rash   Codeine Rash   Hydrocodone-Acetaminophen Other (See Comments)    unknown   Meperidine Hcl Itching   Metoclopramide Hcl Itching and Rash   Metoprolol Succinate Other (See Comments)    nervous   Moxifloxacin Itching   Oxycodone-Aspirin Rash   Pentazocine Lactate Nausea Only   Pioglitazone Other (See Comments)    bloating    Medications Reviewed Today      Reviewed by Tomasa Blase, [facility] (Pharmacist) on 02/02/22 at 1436  Med List Status: <None>   Medication Order Taking? Sig Documenting Provider Last Dose Status Informant  aspirin EC 81 MG tablet [id number] Yes Take 81 mg by mouth daily. [provider] Taking Active   atorvastatin (LIPITOR) 80 MG tablet [id number] Yes TAKE ONE TABLET BY MOUTH EVERYDAY AT BEDTIME Burns, Claudina Lick, MD Taking Active   clopidogrel (PLAVIX) 75 MG tablet [id number] Yes Take 1 tablet (75 mg total) by mouth daily. Binnie Rail, MD Taking Active   DULoxetine (CYMBALTA) 60 MG capsule [id number] Yes TAKE 1 CAPSULE(60 MG) BY MOUTH DAILY Burns, Claudina Lick, MD Taking Active   glimepiride (AMARYL) 1 MG tablet [id number] Yes Take 1 tablet (1 mg total) by mouth daily with breakfast. Binnie Rail, MD Taking Active   levothyroxine (SYNTHROID) 125 MCG tablet [id number] Yes Take 1 tablet (125 mcg total) by mouth daily. Binnie Rail, MD Taking Active   losartan (COZAAR) 25 MG tablet [id number] Yes TAKE 1 TABLET(25 MG) BY MOUTH DAILY Sagardia, Ines Bloomer, MD Taking Active   metoprolol tartrate (LOPRESSOR) 25 MG tablet [id number] Yes Take 1 tablet (25 mg total) by mouth 2 (two) times daily. Binnie Rail, MD Taking Active   nitroGLYCERIN (NITROSTAT) 0.4 MG SL tablet [id number]  Place 1 tablet (0.4 mg total) under the tongue every 5 (five) minutes as needed. For chest pain Sherren Mocha, MD  Active Child           Med Note (DAVIS, SOPHIA A   Wed Jan 06, 2022  2:01 PM) On hand   pantoprazole (PROTONIX) 40 MG tablet [id number] Yes TAKE ONE TABLET BY MOUTH EVERY MORNING Burns, Claudina Lick, MD Taking Active   triamcinolone cream (KENALOG) 0.1 % [id number] Yes  [provider] Taking Active   Med List Note Rosaria Ferries 11/03/17 1915): [facility]-[id number]            Patient Active Problem List   Diagnosis Date Noted   Otitis media 01/22/2022   History of esophageal stricture  01/06/2022   Aortic atherosclerosis ([place]) 11/03/2020   CKD (chronic kidney disease) stage 3, GFR 30-59 ml/min (HCC) 11/02/2020   Urinary frequency 10/17/2020   Poor balance 04/29/2020   Physical deconditioning 04/29/2020   Cold extremities 02/19/2020   Colitis 01/15/2020   Headache 07/21/2019   Other headache syndrome 08/14/2018   Depression 08/14/2018   Stroke ([place]) 11/04/2017   History of cerebrovascular accident (CVA) with residual deficit 11/03/2017   Chronic back pain 07/04/2017   PAD (peripheral artery disease) ([place]) 01/17/2017   Overactive bladder 12/17/2016   Anemia 05/20/2016   Coronary artery disease involving coronary bypass graft of native heart without angina pectoris 05/19/2016   MCI (mild cognitive impairment) with memory loss 11/18/2015   Facial tic 05/07/2015   Sick sinus syndrome ([place]) 02/26/2015   Cervical dystonia 12/04/2014   Pacemaker-Medtronic 07/07/2012   GERD (gastroesophageal reflux disease) 07/05/2012   Cough 08/04/2010   Abnormal involuntary movement 12/17/2009   CAD, ARTERY BYPASS GRAFT 09/24/2009   CAROTID BRUIT 09/24/2009   Diabetes ([place]) 01/30/2009   Dysphagia 06/19/2008   Anxiety 02/14/2008   EROSIVE ESOPHAGITIS 02/14/2008   CONSTIPATION, CHRONIC 02/14/2008   DEGENERATIVE JOINT DISEASE 02/14/2008   HEARING LOSS 01/04/2008   PVD 01/04/2008   Hypothyroidism 09/25/2007   HYPERLIPIDEMIA 09/25/2007   Essential hypertension 09/07/2007   Diabetic polyneuropathy ([place]) 03/16/2007   Stricture and stenosis of esophagus 03/16/2007   HIATAL HERNIA 02/16/2007   HEMORRHOIDS 12/31/1997   DIVERTICULOSIS, COLON 12/31/1997    Immunization History  Administered Date(s) Administered   Fluad Quad(high Dose 65+) 08/17/2019   Influenza Whole 12/06/1997, 09/25/2007   Influenza, High Dose Seasonal PF 09/27/2014, 09/06/2017, 09/29/2018   Influenza-Unspecified 08/06/2013, 08/13/2015, 10/15/2016   PFIZER(Purple Top)SARS-COV-2 Vaccination 01/10/2020, 02/20/2020    Pneumococcal Conjugate-13 08/06/2015   Pneumococcal Polysaccharide-23 12/06/1996, 05/28/2010   Td 05/06/2004   Tdap 06/05/2018    Conditions to be addressed/monitored:  Hypertension, Hyperlipidemia, Diabetes, Coronary Artery Disease, GERD, Chronic Kidney Disease, Hypothyroidism, Depression and Anxiety  Care Plan : CCM Pharmacy Care Plan  Updates made by Tomasa Blase, RPH since 02/02/2022 12:00 AM     Problem: Hypertension, Hyperlipidemia, Diabetes, Coronary Artery Disease, Chronic Kidney Disease   Priority: High     Long-Range Goal: Disease management   Start Date: 03/12/2021  Expected End Date: 02/02/2023  This Visit's Progress: On track  Recent Progress: On track  Priority: High  Note:   Current Barriers:  Unable to independently monitor therapeutic efficacy Caregiver cannot always ensure compliance with daily medications  Pharmacist Clinical Goal(s):  Patient will achieve adherence to monitoring guidelines and medication adherence to achieve therapeutic efficacy through collaboration with PharmD and provider.   Interventions: 1:1 collaboration with Binnie Rail, MD regarding development and update of comprehensive plan of care as evidenced by provider attestation and co-signature Inter-disciplinary care team collaboration (see longitudinal plan of care) Comprehensive medication review performed; medication list updated in electronic medical record  Hypertension / CKD stage 4 (BP goal <140/90) -Controlled - BP is at goal in clinic; pt is not checking BP at home; denies s/sx of high or low BP -Current treatment: Metoprolol tartrate 25 mg BID -Educated on BP goals and benefits of medications for prevention of heart attack, stroke and kidney damage; -Counseled to monitor BP at home as needed, document, and provide log at future appointments -Recommended to continue current medication  Hyperlipidemia / CAD: (LDL goal < 130) -Hx CAD (CABG 1992, redo 2003, stent place  05/2016); Hx stroke 2017 -Controlled- based on  cardiac history, ideally LDL < 70 offers best protection; however given patient age, dementia, and taking max-tolerated statin, current control is reasonable (LDL improved from 283 over time) Lab Results  Component Value Date   LDLCALC 101 (H) 08/26/2021  -Current treatment: Atorvastatin 80 mg daily AM Clopidogrel 75 mg daily AM Nitroglycerin 0.4 mg SL prn -Educated on Cholesterol goals; Benefits of statin for ASCVD risk reduction; -Recommended to continue current medication  Diabetes (A1c goal <8%) -Controlled - A1c improved since restarting glimepiride  Lab Results  Component Value Date   HGBA1C 7.2 (H) 01/25/2022  -Current medications: Glimepiride 19m daily  -Medications previously tried: metformin, Januvia (cost) Current meal patterns:  breakfast: cereal with fruit   lunch: sandwich ([place]/ ham / cream cheese)  dinner: chicken, noodles, meatloaf, rice, cereal snacks: cereal drinks: water, diet soda -Current home glucose readings: not checking -Denies hypoglycemic/hyperglycemic symptoms -Educated on A1c and blood sugar goals; reviewed with patient's son and husband about how diet can effect BG, advised for patient to reduce carb intake, and increase lean protein and vegetable intake  -Recommended to continue current medication, advised for changes in diet to help further improve BG   Hypothyroidism (Goal: maintenance of euthyroid levels) -Controlled Lab Results  Component Value Date   TSH 1.90 01/25/2022  -Current treatment  Levothyroxine 1232m daily  -Medications previously tried: n/a  -Counseled on diet and exercise extensively Recommended to continue current medication   Patient Goals/Self-Care Activities Patient will:  - take medications as prescribed -Focus on medication adherence by routine     Medication Assistance: None required.  Patient affirms current coverage meets  needs.  Compliance/Adherence/Medication fill history: Care Gaps: Shingrix Covid booster  DEXA scan Ophthalmology Exam Foot Exam   Patient's preferred pharmacy is:  [phone] [place]hone: [phone]: [phone] Uses pill box? No - prefers bottles Pt endorses 80% compliance  Patient decided to: Utilize [facility] for medication synchronization, packaging and delivery  Care Plan and Follow Up Patient Decision:  Patient agrees to Care Plan and Follow-up.  Plan: Telephone follow up appointment with care management team member scheduled for:  6 months  DaTomasa BlasePharmD Clinical Pharmacist, [place]

## 2022-02-02 NOTE — Patient Instructions (Signed)
Visit Information  Following are the goals we discussed today:   Manage My Medicine   Timeframe:  Long-Range Goal Priority:  High Start Date:  03/12/21                  Expected End Date:   02/02/2023        Follow Up Date 08/02/2022   - call for medicine refill 2 or 3 days before it runs out - call if I am sick and can't take my medicine - keep a list of all the medicines I take; vitamins and herbals too    Why is this important?   These steps will help you keep on track with your medicines.  Plan: Telephone follow up appointment with care management team member scheduled for:  6 months The patient has been provided with contact information for the care management team and has been advised to call with any health related questions or concerns.   Tomasa Blase, PharmD Clinical Pharmacist, [facility]   Please call the care guide team at [phone] if you need to cancel or reschedule your appointment.   Patient verbalizes understanding of instructions and care plan provided today and agrees to view in [place]. Active MyChart status confirmed with patient.

## 2022-02-04 ENCOUNTER — Ambulatory Visit (HOSPITAL_COMMUNITY)
Admission: RE | Admit: 2022-02-04 | Discharge: 2022-02-04 | Disposition: A | Discharge status: Home / Self Care | Payer: Medicare Other | Source: Ambulatory Visit | Attending: Gastroenterology | Admitting: Gastroenterology

## 2022-02-04 ENCOUNTER — Other Ambulatory Visit: Payer: Self-pay

## 2022-02-04 DIAGNOSIS — R059 Cough, unspecified: Secondary | ICD-10-CM | POA: Diagnosis not present

## 2022-02-04 DIAGNOSIS — R1310 Dysphagia, unspecified: Secondary | ICD-10-CM | POA: Insufficient documentation

## 2022-02-04 DIAGNOSIS — T17420A Food in trachea causing asphyxiation, initial encounter: Secondary | ICD-10-CM | POA: Diagnosis not present

## 2022-02-25 DIAGNOSIS — H353132 Nonexudative age-related macular degeneration, bilateral, intermediate dry stage: Secondary | ICD-10-CM | POA: Diagnosis not present

## 2022-02-25 DIAGNOSIS — E119 Type 2 diabetes mellitus without complications: Secondary | ICD-10-CM | POA: Diagnosis not present

## 2022-02-25 DIAGNOSIS — H5212 Myopia, left eye: Secondary | ICD-10-CM | POA: Diagnosis not present

## 2022-03-08 ENCOUNTER — Other Ambulatory Visit: Payer: Self-pay | Admitting: Internal Medicine

## 2022-03-08 ENCOUNTER — Other Ambulatory Visit: Payer: Self-pay | Admitting: Emergency Medicine

## 2022-03-15 ENCOUNTER — Telehealth: Payer: Self-pay

## 2022-03-15 MED ORDER — ATORVASTATIN CALCIUM 80 MG PO TABS: ORAL_TABLET | ORAL | 0 refills | Status: DC

## 2022-03-15 NOTE — Telephone Encounter (Signed)
Pt husband is requesting a refill on: ?atorvastatin (LIPITOR) 80 MG tablet ? ?Pharmacy: ?[facility] [place] [place] [facility] [address] ? ?LOV 01/25/22 ?ROV 07/12/22 ? ?

## 2022-03-16 DIAGNOSIS — H43813 Vitreous degeneration, bilateral: Secondary | ICD-10-CM | POA: Diagnosis not present

## 2022-03-16 DIAGNOSIS — H353122 Nonexudative age-related macular degeneration, left eye, intermediate dry stage: Secondary | ICD-10-CM | POA: Diagnosis not present

## 2022-03-16 DIAGNOSIS — H353212 Exudative age-related macular degeneration, right eye, with inactive choroidal neovascularization: Secondary | ICD-10-CM | POA: Diagnosis not present

## 2022-03-16 DIAGNOSIS — H43393 Other vitreous opacities, bilateral: Secondary | ICD-10-CM | POA: Diagnosis not present

## 2022-03-22 ENCOUNTER — Other Ambulatory Visit: Payer: Self-pay | Admitting: Internal Medicine

## 2022-03-22 DIAGNOSIS — I200 Unstable angina: Secondary | ICD-10-CM

## 2022-03-22 DIAGNOSIS — I25700 Atherosclerosis of coronary artery bypass graft(s), unspecified, with unstable angina pectoris: Secondary | ICD-10-CM

## 2022-04-08 ENCOUNTER — Encounter: Payer: Self-pay | Admitting: Internal Medicine

## 2022-04-08 NOTE — Progress Notes (Signed)
? ? ?Subjective:  ? ? Patient ID: Janet Steele, female    DOB: 1929/04/30, 88 y.o.   MRN: [id number] ? ?This visit occurred during the SARS-CoV-2 public health emergency.  Safety protocols were in place, including screening questions prior to the visit, additional usage of staff PPE, and extensive cleaning of exam room while observing appropriate contact time as indicated for disinfecting solutions. ? ? ? ?HPI ?Janet Steele is here for  ?Chief Complaint  ?Patient presents with  ? Cough  ? Memory Loss  ? ? ?Cough x months -she continues to cough.  She does have some postnasal drip so there may be some allergy component to it.  She has known dysphagia and she does cough a lot around eating.  She does cough when she lays down.  Her family and caretakers remind her to eat slowly, take sips of water before and after bites and to elevate her bed when she sleeps.  They do thicken her liquids. ? ? ?Saw GI 2/203 for dysphagia.    Swallow eval- 02/2022 aspiration risk due to dysmotility of esophagus.  ST advised taking pills with puree and starting and following with liquids. Regular solids, thin liquids  via cup or straw.   They are thickening the liquids.  They are cutting off the crust off bread.   ? ?She is eating leftovers and has had some diarrhea.  She has a sensitive stomach.    ? ?CXR 08/2021:  NAPD ? ? ?Dementia - she is confused during the day.  Her family wondered about starting her on medication.  At one point she was seeing neurology for her memory and she was on Aricept.  Not sure if she had a side effect to that and that is why it was stopped or not. ? ? ? ?Medications and allergies reviewed with patient and updated if appropriate. ? ?Current Outpatient Medications on File Prior to Visit  ?Medication Sig Dispense Refill  ? aspirin EC 81 MG tablet Take 81 mg by mouth daily.    ? atorvastatin (LIPITOR) 80 MG tablet TAKE ONE TABLET BY MOUTH EVERYDAY AT BEDTIME 90 tablet 0  ? clopidogrel (PLAVIX) 75 MG tablet Take 1  tablet (75 mg total) by mouth daily. Follow-up appt due in August must see MD for refills 90 tablet 0  ? DULoxetine (CYMBALTA) 60 MG capsule TAKE 1 CAPSULE(60 MG) BY MOUTH DAILY 90 capsule 1  ? glimepiride (AMARYL) 1 MG tablet Take 1 tablet (1 mg total) by mouth daily with breakfast. 90 tablet 1  ? levothyroxine (SYNTHROID) 125 MCG tablet TAKE 1 TABLET(125 MCG) BY MOUTH DAILY 90 tablet 3  ? losartan (COZAAR) 25 MG tablet TAKE 1 TABLET(25 MG) BY MOUTH DAILY 90 tablet 0  ? metoprolol tartrate (LOPRESSOR) 25 MG tablet Take 1 tablet (25 mg total) by mouth 2 (two) times daily. 180 tablet 1  ? nitroGLYCERIN (NITROSTAT) 0.4 MG SL tablet Place 1 tablet (0.4 mg total) under the tongue every 5 (five) minutes as needed. For chest pain 25 tablet 5  ? pantoprazole (PROTONIX) 40 MG tablet TAKE ONE TABLET BY MOUTH EVERY MORNING 90 tablet 1  ? triamcinolone cream (KENALOG) 0.1 %     ? ?No current facility-administered medications on file prior to visit.  ? ? ?Review of Systems  ?Constitutional:  Negative for fever.  ?Respiratory:  Negative for cough, shortness of breath and wheezing.   ?Cardiovascular:  Negative for chest pain, palpitations and leg swelling.  ?Neurological:  Positive for headaches.  Negative for light-headedness.  ? ?   ?Objective:  ? ?Vitals:  ? 04/09/22 1259  ?BP: 126/74  ?Pulse: 81  ?Temp: 98.1 ?F (36.7 ?C)  ?SpO2: 94%  ? ?BP Readings from Last 3 Encounters:  ?04/09/22 126/74  ?01/25/22 130/80  ?01/19/22 124/70  ? ?Wt Readings from Last 3 Encounters:  ?01/06/22 109 lb 4 oz (49.6 kg)  ?12/16/20 116 lb 12.8 oz (53 kg)  ?11/24/20 117 lb (53.1 kg)  ? ?Body mass index is 21.34 kg/m?. ? ?  ?Physical Exam ?Constitutional:   ?   General: She is not in acute distress. ?   Appearance: Normal appearance.  ?HENT:  ?   Head: Normocephalic and atraumatic.  ?Eyes:  ?   Conjunctiva/sclera: Conjunctivae normal.  ?Cardiovascular:  ?   Rate and Rhythm: Normal rate and regular rhythm.  ?   Heart sounds: Normal heart sounds. No  murmur heard. ?Pulmonary:  ?   Effort: Pulmonary effort is normal. No respiratory distress.  ?   Breath sounds: Normal breath sounds. No wheezing.  ?Abdominal:  ?   General: There is no distension.  ?   Palpations: Abdomen is soft.  ?   Tenderness: There is no abdominal tenderness.  ?Musculoskeletal:  ?   Cervical back: Neck supple.  ?   Right lower leg: No edema.  ?   Left lower leg: No edema.  ?Lymphadenopathy:  ?   Cervical: No cervical adenopathy.  ?Skin: ?   General: Skin is warm and dry.  ?   Findings: No rash.  ?Neurological:  ?   Mental Status: She is alert. Mental status is at baseline.  ?Psychiatric:     ?   Mood and Affect: Mood normal.     ?   Behavior: Behavior normal.  ? ?   ? ? ? ? ? ?Assessment & Plan:  ? ? ?See Problem List for Assessment and Plan of chronic medical problems.  ? ? ? ? ?

## 2022-04-09 ENCOUNTER — Ambulatory Visit (INDEPENDENT_AMBULATORY_CARE_PROVIDER_SITE_OTHER): Payer: Medicare Other | Admitting: Internal Medicine

## 2022-04-09 ENCOUNTER — Ambulatory Visit: Payer: Medicare Other | Admitting: Internal Medicine

## 2022-04-09 VITALS — BP 126/74 | HR 81 | Temp 98.1°F | Ht 60.0 in

## 2022-04-09 DIAGNOSIS — K219 Gastro-esophageal reflux disease without esophagitis: Secondary | ICD-10-CM

## 2022-04-09 DIAGNOSIS — R053 Chronic cough: Secondary | ICD-10-CM | POA: Diagnosis not present

## 2022-04-09 DIAGNOSIS — G3184 Mild cognitive impairment, so stated: Secondary | ICD-10-CM | POA: Diagnosis not present

## 2022-04-09 DIAGNOSIS — I200 Unstable angina: Secondary | ICD-10-CM

## 2022-04-09 DIAGNOSIS — I10 Essential (primary) hypertension: Secondary | ICD-10-CM

## 2022-04-09 MED ORDER — DONEPEZIL HCL 5 MG PO TABS: 5.0000 mg | ORAL_TABLET | Freq: Every day | ORAL | 5 refills | Status: DC

## 2022-04-09 NOTE — Patient Instructions (Addendum)
? ? ? ?  Medications changes include :   aricept 5 mg at bedtime ? ? ?Your prescription(s) have been sent to your pharmacy.  ? ? ? ? ?Return for follow up as scheduled. ? ?

## 2022-04-10 NOTE — Assessment & Plan Note (Signed)
Chronic ?Most of her cough seems to be related to dysphagia with eating ?PND and GERD may also be contributing at times to her cough ?Unfortunately because of her dementia she needs to be reminded frequently to elevate her bed and to eat slowly and drink fluids between bites, which she is not consistent with ?Family is doing the best they can ?At this point I do not think there is much more we can do with medications as much is continuing to work on her behavior and the family understands ?

## 2022-04-10 NOTE — Assessment & Plan Note (Signed)
Chronic Blood pressure well controlled Continue losartan 25 mg daily, metoprolol 25 mg twice daily 

## 2022-04-10 NOTE — Assessment & Plan Note (Signed)
Chronic ?Has not seen neurology in a while for her memory ?Was on Aricept at 1 point-not sure if she tolerated it or not ?Start donepezil 5 mg at bedtime.  Discussed possible side effects.  Will increase to 10 mg after 1 month if tolerated ?At the next appointment we will consider adding second medication ?

## 2022-04-10 NOTE — Assessment & Plan Note (Signed)
Chronic ?Hard to tell how well controlled her GERD is, but it seems like it is well controlled its more the dysphagia that is an issue ?Continue pantoprazole 40 mg daily ?

## 2022-04-29 ENCOUNTER — Telehealth: Payer: Self-pay | Admitting: Internal Medicine

## 2022-04-29 NOTE — Telephone Encounter (Signed)
Left message for patient to call back to schedule Medicare Annual Wellness Visit   Last AWV  06/04/20  Please schedule at anytime with [facility] if patient calls the office back.      Any questions, please call me at [phone]

## 2022-05-28 ENCOUNTER — Other Ambulatory Visit: Payer: Self-pay | Admitting: Internal Medicine

## 2022-06-06 ENCOUNTER — Other Ambulatory Visit: Payer: Self-pay | Admitting: Emergency Medicine

## 2022-06-06 NOTE — Telephone Encounter (Signed)
Your patient 

## 2022-06-10 ENCOUNTER — Other Ambulatory Visit: Payer: Self-pay | Admitting: Internal Medicine

## 2022-06-14 ENCOUNTER — Other Ambulatory Visit: Payer: Self-pay | Admitting: Internal Medicine

## 2022-06-20 ENCOUNTER — Other Ambulatory Visit: Payer: Self-pay | Admitting: Internal Medicine

## 2022-06-20 DIAGNOSIS — I25700 Atherosclerosis of coronary artery bypass graft(s), unspecified, with unstable angina pectoris: Secondary | ICD-10-CM

## 2022-06-20 DIAGNOSIS — I200 Unstable angina: Secondary | ICD-10-CM

## 2022-06-28 ENCOUNTER — Ambulatory Visit (INDEPENDENT_AMBULATORY_CARE_PROVIDER_SITE_OTHER): Payer: Medicare Other | Admitting: Internal Medicine

## 2022-06-28 ENCOUNTER — Ambulatory Visit (INDEPENDENT_AMBULATORY_CARE_PROVIDER_SITE_OTHER): Payer: Medicare Other

## 2022-06-28 ENCOUNTER — Encounter: Payer: Self-pay | Admitting: Internal Medicine

## 2022-06-28 VITALS — BP 100/50 | HR 70 | Temp 97.7°F | Ht 60.0 in | Wt 100.5 lb

## 2022-06-28 DIAGNOSIS — R1032 Left lower quadrant pain: Secondary | ICD-10-CM | POA: Diagnosis not present

## 2022-06-28 DIAGNOSIS — R0789 Other chest pain: Secondary | ICD-10-CM

## 2022-06-28 DIAGNOSIS — R053 Chronic cough: Secondary | ICD-10-CM

## 2022-06-28 DIAGNOSIS — I200 Unstable angina: Secondary | ICD-10-CM

## 2022-06-28 NOTE — Progress Notes (Signed)
   Subjective:   Patient ID: Janet Steele, female    DOB: [birth year], 88 y.o.   MRN: [id number]  HPI The patient is a 88 YO female coming in for abdomen pain in the lower abdomen. She also has other concerns. This is longstanding over years and maybe more consistent lately.   Review of Systems  Constitutional: Negative.   HENT: Negative.    Eyes: Negative.   Respiratory:  Positive for cough. Negative for chest tightness and shortness of breath.   Cardiovascular:  Negative for chest pain, palpitations and leg swelling.  Gastrointestinal:  Positive for abdominal pain and constipation. Negative for abdominal distention, diarrhea, nausea and vomiting.  Musculoskeletal: Negative.   Skin: Negative.   Neurological: Negative.   Psychiatric/Behavioral: Negative.      Objective:  Physical Exam Constitutional:      Appearance: She is well-developed.  HENT:     Head: Normocephalic and atraumatic.  Cardiovascular:     Rate and Rhythm: Normal rate and regular rhythm.  Pulmonary:     Effort: Pulmonary effort is normal. No respiratory distress.     Breath sounds: Normal breath sounds. No wheezing or rales.  Abdominal:     General: Bowel sounds are normal. There is no distension.     Palpations: Abdomen is soft.     Tenderness: There is abdominal tenderness. There is no rebound.  Musculoskeletal:     Cervical back: Normal range of motion.  Skin:    General: Skin is warm and dry.  Neurological:     Mental Status: She is alert and oriented to person, place, and time.     Coordination: Coordination normal.     Vitals:   06/28/22 1311  BP: (!) 100/50  Pulse: 70  Temp: 97.7 F (36.5 C)  TempSrc: Oral  SpO2: 97%  Weight: 100 lb 8 oz (45.6 kg)  Height: 5' (1.524 m)    Assessment & Plan:  Visit time 25 minutes in face to face communication with patient and coordination of care, additional 10 minutes spent in record review, coordination or care, ordering tests,  communicating/referring to other healthcare professionals, documenting in medical records all on the same day of the visit for total time 35 minutes spent on the visit.

## 2022-06-28 NOTE — Patient Instructions (Signed)
We will check the x-ray today of the stomach.

## 2022-07-01 ENCOUNTER — Encounter: Payer: Self-pay | Admitting: Internal Medicine

## 2022-07-01 NOTE — Assessment & Plan Note (Signed)
She and husband did not recall they had discussed this with their PCP. This was previously felt to be related to swallowing problems. Discussed chin tuck and small bites with sips of water in between. No signs of acute flare today and lung exam normal.

## 2022-07-01 NOTE — Assessment & Plan Note (Signed)
Suspect related to constipation as she has stopped her stool softener for unknown reason and cannot tell how often BM are but maybe every 3-4 days. Checking x-ray abdomen today to assess. She does not have signs of obstruction today.

## 2022-07-05 ENCOUNTER — Ambulatory Visit: Payer: Medicare Other | Admitting: Internal Medicine

## 2022-07-06 ENCOUNTER — Other Ambulatory Visit: Payer: Self-pay | Admitting: Internal Medicine

## 2022-07-07 ENCOUNTER — Ambulatory Visit (INDEPENDENT_AMBULATORY_CARE_PROVIDER_SITE_OTHER): Payer: Medicare Other | Admitting: Cardiovascular Disease

## 2022-07-07 ENCOUNTER — Encounter: Payer: Self-pay | Admitting: Cardiovascular Disease

## 2022-07-07 VITALS — BP 110/60 | HR 67 | Ht 60.0 in | Wt 102.0 lb

## 2022-07-07 DIAGNOSIS — E782 Mixed hyperlipidemia: Secondary | ICD-10-CM | POA: Diagnosis not present

## 2022-07-07 DIAGNOSIS — I200 Unstable angina: Secondary | ICD-10-CM

## 2022-07-07 DIAGNOSIS — I25119 Atherosclerotic heart disease of native coronary artery with unspecified angina pectoris: Secondary | ICD-10-CM | POA: Diagnosis not present

## 2022-07-07 NOTE — Patient Instructions (Signed)
Medication Instructions:  Your physician recommends that you continue on your current medications as directed. Please refer to the Current Medication list given to you today.  *If you need a refill on your cardiac medications before your next appointment, please call your pharmacy*   Lab Work: NONE If you have labs (blood work) drawn today and your tests are completely normal, you will receive your results only by: MyChart Message (if you have MyChart) OR A paper copy in the mail If you have any lab test that is abnormal or we need to change your treatment, we will call you to review the results.   Testing/Procedures: NONE   Follow-Up: At [facility], you and your health needs are our priority.  As part of our continuing mission to provide you with exceptional heart care, we have created designated Provider Care Teams.  These Care Teams include your primary Cardiologist (physician) and Advanced Practice Providers (APPs -  Physician Assistants and Nurse Practitioners) who all work together to provide you with the care you need, when you need it.  We recommend signing up for the patient portal called "MyChart".  Sign up information is provided on this After Visit Summary.  MyChart is used to connect with patients for Virtual Visits (Telemedicine).  Patients are able to view lab/test results, encounter notes, upcoming appointments, etc.  Non-urgent messages can be sent to your provider as well.   To learn more about what you can do with MyChart, go to [email or web address].    Your next appointment:   1 year(s)  The format for your next appointment:   In Person  Provider:   Michael Cooper, MD      Important Information About Sugar       

## 2022-07-07 NOTE — Progress Notes (Signed)
Cardiology Office Note:    Date:  07/07/2022   ID:  Janet Steele, DOB [birth year], MRN [id number]  PCP:  Binnie Rail, MD   [facility] Cardiologist:  Sherren Mocha, MD     Referring MD: Binnie Rail, MD   Chief Complaint  Patient presents with   Coronary Artery Disease    History of Present Illness:    Janet Steele is a 88 y.o. female with a hx of extensive CAD, presenting for follow-up evaluation.  The patient had remote CABG in 1992 and redo CABG in 2003.  Comorbid conditions include hypertension, mixed hyperlipidemia, history of type 2 diabetes, and permanent pacemaker implantation for treatment of symptomatic bradycardia.  The patient is here with a caregiver today.  She denies chest pain, chest pressure, or shortness of breath.  She reports that she is taking her medications.  She has no cardiac-related complaints and specifically denies edema, orthopnea, PND, or heart palpitations.  Past Medical History:  Diagnosis Date   Anemia    Anxiety    Arthritis    "fingers" (05/19/2016   CAD (coronary artery disease)    a. s/p CABG 1982 b. redo CABG 2003. c. [place] despite med rx 05/2016: successful but complicated PCI of PDA beyond graft site, c/b localized dissection requiring overlapping stent.   Carotid bruit    a. Duplex 01/2015: patent vessels, 1-39% BICA, f/u PRN recommened.   Colon polyp    adenomatous   Colon, diverticulosis    Degenerative joint disease    Diastolic dysfunction    Esophageal stricture    GERD (gastroesophageal reflux disease)    Hiatal hernia    History of blood transfusion    "related to my bypass"   Hyperlipidemia    Hypertension    Hypothyroidism    Ischemic cardiomyopathy    a. Cath 05/2016: EF 40% with severe inferior wall HK, suspect hibernating myocardium.   Myocardial infarction ([place]) 1977; 1982   Osteoarthrosis, unspecified whether generalized or localized, unspecified site    Presence of permanent cardiac pacemaker     PVD (peripheral vascular disease) ([place])    Stroke ([place])    greater than 2 years ago per husband   Type II diabetes mellitus ([place])    Unspecified hearing loss     Past Surgical History:  Procedure Laterality Date   ABDOMINAL HYSTERECTOMY     APPENDECTOMY     BACK SURGERY     CARDIAC CATHETERIZATION N/A 05/19/2016   Procedure: Left Heart Cath and Cors/Grafts Angiography;  Surgeon: Belva Crome, MD;  Location: [place] [facility];  Service: Cardiovascular;  Laterality: N/A;   CARDIAC CATHETERIZATION N/A 05/19/2016   Procedure: Coronary Stent Intervention;  Surgeon: Belva Crome, MD;  Location: [place] [facility];  Service: Cardiovascular;  Laterality: N/A;   CATARACT EXTRACTION W/ INTRAOCULAR LENS  IMPLANT, BILATERAL Bilateral    CHOLECYSTECTOMY OPEN     COLONOSCOPY     CORONARY ANGIOPLASTY WITH STENT PLACEMENT  05/19/2016   "2 stents?"   CORONARY ARTERY BYPASS GRAFT  1982; 2003   x4(1982),02-2002 CABG X2   ESOPHAGOGASTRODUODENOSCOPY (EGD) WITH ESOPHAGEAL DILATION  "2-3 times"   INSERT / REPLACE / REMOVE PACEMAKER     LEFT HEART CATHETERIZATION WITH CORONARY/GRAFT ANGIOGRAM N/A 03/05/2014   Procedure: LEFT HEART CATHETERIZATION WITH Beatrix Fetters;  Surgeon: Sinclair Grooms, MD;  Location: [facility];  Service: Cardiovascular;  Laterality: N/A;   [place] SURGERY  01-2000  OOPHORECTOMY Bilateral    PERMANENT PACEMAKER INSERTION N/A 07/06/2012   MDT Adapta L implanted by Dr Rayann Heman for symptomatic bradycardia   TONSILLECTOMY  1930s   UPPER GASTROINTESTINAL ENDOSCOPY      Current Medications: Current Meds  Medication Sig   aspirin EC 81 MG tablet Take 81 mg by mouth daily.   atorvastatin (LIPITOR) 80 MG tablet TAKE 1 TABLET BY MOUTH EVERY DAY AT BEDTIME   clopidogrel (PLAVIX) 75 MG tablet TAKE 1 TABLET(75 MG) BY MOUTH DAILY. FOLLOW-UP   donepezil (ARICEPT) 5 MG tablet Take 1 tablet (5 mg total) by mouth at bedtime.   DULoxetine (CYMBALTA) 60 MG capsule TAKE 1  CAPSULE(60 MG) BY MOUTH DAILY   glimepiride (AMARYL) 1 MG tablet Take 1 tablet (1 mg total) by mouth daily with breakfast.   levothyroxine (SYNTHROID) 125 MCG tablet TAKE 1 TABLET(125 MCG) BY MOUTH DAILY   losartan (COZAAR) 25 MG tablet TAKE 1 TABLET(25 MG) BY MOUTH DAILY   metoprolol tartrate (LOPRESSOR) 25 MG tablet Take 1 tablet (25 mg total) by mouth 2 (two) times daily.   nitroGLYCERIN (NITROSTAT) 0.4 MG SL tablet Place 1 tablet (0.4 mg total) under the tongue every 5 (five) minutes as needed. For chest pain   pantoprazole (PROTONIX) 40 MG tablet TAKE 1 TABLET(40 MG) BY MOUTH DAILY   triamcinolone cream (KENALOG) 0.1 %      Allergies:   Morphine, Penicillins, Prednisone, Sulfonamide derivatives, Codeine, Hydrocodone-acetaminophen, Meperidine hcl, Metoclopramide hcl, Metoprolol succinate, Moxifloxacin, Oxycodone-aspirin, Pentazocine lactate, and Pioglitazone   Social History   Socioeconomic History   Marital status: Widowed    Spouse name: [name]   Number of children: 3   Years of education: 16   Highest education level: Not on file  Occupational History   Occupation: Retired     Fish farm manager: RETIRED  Tobacco Use   Smoking status: Former    Packs/day: 0.50    Years: 30.00    Total pack years: 15.00    Types: Cigarettes    Quit date: 07/05/1976    Years since quitting: 46.0   Smokeless tobacco: Never  Vaping Use   Vaping Use: Never used  Substance and Sexual Activity   Alcohol use: No    Alcohol/week: 0.0 standard drinks of alcohol    Comment: "not anymore, I haven't in I don't know years"   Drug use: No   Sexual activity: Never  Other Topics Concern   Not on file  Social History Narrative   Patient is married [name]) and lives at home with her husband.   Patient has three adult children.   Patient is retired.   Patient has a college education.   Patient is right-handed.   Patient does not drink any caffeine.   Social Determinants of Health   Financial Resource  Strain: Low Risk  (06/04/2020)   Overall Financial Resource Strain (CARDIA)    Difficulty of Paying Living Expenses: Not hard at all  Food Insecurity: No Food Insecurity (06/04/2020)   Hunger Vital Sign    Worried About Running Out of Food in the Last Year: Never true    Ran Out of Food in the Last Year: Never true  Transportation Needs: No Transportation Needs (06/04/2020)   PRAPARE - Hydrologist (Medical): No    Lack of Transportation (Non-Medical): No  Physical Activity: Unknown (06/04/2020)   Exercise Vital Sign    Days of Exercise per Week: Patient refused    Minutes of Exercise per Session:  Not on file  Stress: No Stress Concern Present (06/04/2020)   [place]    Feeling of Stress : Not at all  Social Connections: Unknown (06/04/2020)   Social Connection and Isolation Panel [NHANES]    Frequency of Communication with Friends and Family: More than three times a week    Frequency of Social Gatherings with Friends and Family: More than three times a week    Attends Religious Services: Patient refused    Marine scientist or Organizations: Patient refused    Attends Archivist Meetings: Patient refused    Marital Status: Married     Family History: The patient's family history includes Heart attack in her sister; Heart disease in her sister. There is no history of Colon cancer, Breast cancer, Celiac disease, Cirrhosis, Clotting disorder, Colitis, Colon polyps, Crohn's disease, Cystic fibrosis, Diabetes, Esophageal cancer, Hemochromatosis, Inflammatory bowel disease, Irritable bowel syndrome, Kidney disease, Liver cancer, Liver disease, Ovarian cancer, Pancreatic cancer, Prostate cancer, Rectal cancer, Stomach cancer, Ulcerative colitis, Uterine cancer, or Wilson's disease.  ROS:   Please see the history of present illness.    All other systems reviewed and are  negative.  EKGs/Labs/Other Studies Reviewed:    EKG:  EKG is ordered today.  The ekg ordered today demonstrates atrial paced rhythm 67 bpm, age-indeterminate inferior infarct.  Recent Labs: 01/25/2022: ALT 10; BUN 27; Creatinine, Ser 1.40; Hemoglobin 11.2; Platelets 337.0; Potassium 4.9; Sodium 135; TSH 1.90  Recent Lipid Panel    Component Value Date/Time   CHOL 166 08/26/2021 1417   TRIG 153.0 (H) 08/26/2021 1417   HDL 34.40 (L) 08/26/2021 1417   CHOLHDL 5 08/26/2021 1417   VLDL 30.6 08/26/2021 1417   LDLCALC 101 (H) 08/26/2021 1417   LDLDIRECT 254.0 11/01/2017 1702     Risk Assessment/Calculations:           Physical Exam:    VS:  BP 110/60   Pulse 67   Ht 5' (1.524 m)   Wt 102 lb (46.3 kg)   SpO2 96%   BMI 19.92 kg/m     Wt Readings from Last 3 Encounters:  07/07/22 102 lb (46.3 kg)  06/28/22 100 lb 8 oz (45.6 kg)  01/06/22 109 lb 4 oz (49.6 kg)     GEN: Elderly, thin woman in no acute distress HEENT: Normal NECK: No JVD; No carotid bruits LYMPHATICS: No lymphadenopathy CARDIAC: RRR, no murmurs, rubs, gallops RESPIRATORY:  Clear to auscultation without rales, wheezing or rhonchi  ABDOMEN: Soft, non-tender, non-distended MUSCULOSKELETAL:  No edema; No deformity  SKIN: Warm and dry NEUROLOGIC:  Alert and oriented x 3 PSYCHIATRIC:  Normal affect   ASSESSMENT:    1. Coronary artery disease involving native coronary artery of native heart with angina pectoris ([place])   2. Mixed hyperlipidemia    PLAN:    In order of problems listed above:  The patient appears clinically stable from a cardiac perspective.  She remains on dual antiplatelet therapy with aspirin and clopidogrel, high intensity statin drug with atorvastatin 80 mg daily, and ARB with losartan, and a beta-blocker with metoprolol 25 mg twice daily.  She is not requiring sublingual nitroglycerin.  She has no anginal symptoms at her current activity level.  She will continue on her current medical  program and I will see her back in 1 year for follow-up evaluation. Treated with atorvastatin 80 mg daily.  Last lipids reviewed, demonstrating a cholesterol of 166, LDL 101,  HDL 34.  At her advanced age with comorbid conditions, it seems appropriate to continue her on her current medical program.           Medication Adjustments/Labs and Tests Ordered: Current medicines are reviewed at length with the patient today.  Concerns regarding medicines are outlined above.  Orders Placed This Encounter  Procedures   EKG 12-Lead   No orders of the defined types were placed in this encounter.   Patient Instructions  Medication Instructions:  Your physician recommends that you continue on your current medications as directed. Please refer to the Current Medication list given to you today.  *If you need a refill on your cardiac medications before your next appointment, please call your pharmacy*   Lab Work: NONE If you have labs (blood work) drawn today and your tests are completely normal, you will receive your results only by: [place] (if you have MyChart) OR A paper copy in the mail If you have any lab test that is abnormal or we need to change your treatment, we will call you to review the results.   Testing/Procedures: NONE   Follow-Up: At [facility], you and your health needs are our priority.  As part of our continuing mission to provide you with exceptional heart care, we have created designated Provider Care Teams.  These Care Teams include your primary Cardiologist (physician) and Advanced Practice Providers (APPs -  Physician Assistants and Nurse Practitioners) who all work together to provide you with the care you need, when you need it.  We recommend signing up for the patient portal called "MyChart".  Sign up information is provided on this After Visit Summary.  MyChart is used to connect with patients for Virtual Visits (Telemedicine).  Patients are able to view  lab/test results, encounter notes, upcoming appointments, etc.  Non-urgent messages can be sent to your provider as well.   To learn more about what you can do with MyChart, go to [email or web address].    Your next appointment:   1 year(s)  The format for your next appointment:   In Person  Provider:   Sherren Mocha, MD       Important Information About Sugar         Signed, Sherren Mocha, MD  07/07/2022 6:02 PM    [place]

## 2022-07-08 ENCOUNTER — Ambulatory Visit: Payer: Self-pay | Admitting: Licensed Clinical Social Worker

## 2022-07-08 NOTE — Patient Outreach (Signed)
  Care Coordination   07/08/2022 Name: Janet Steele MRN: [id number] DOB: 1929/09/20   Care Coordination Outreach Attempts:  Contact was made with the patient today to offer care coordination services as a benefit of their health plan. The patient requested a return call on a later date.   Follow Up Plan:  appointment scheduled Aug.8th   Encounter Outcome:  Pt. Scheduled  Care Coordination Interventions Activated:  No   Care Coordination Interventions:  No, not indicated    Casimer Lanius, [place] [phone]

## 2022-07-12 ENCOUNTER — Ambulatory Visit: Payer: Medicare Other | Admitting: Internal Medicine

## 2022-07-13 ENCOUNTER — Ambulatory Visit: Payer: Self-pay | Admitting: Licensed Clinical Social Worker

## 2022-07-13 NOTE — Patient Outreach (Signed)
  Care Coordination  Initial Visit Note   07/13/2022 Name: Janet Steele MRN: [id number] DOB: 05-Jan-1929  Janet Steele is a 88 y.o. year old female who sees Burns, Janet Lick, MD for primary care. I spoke with  Janet Steele by phone today  What matters to the patients health and wellness today?  Patient reports no concerns or needs with health and wellness related to physical or mental heath. .   Recommendation: Patient may benefit from, and is in agreement to AWV when office calls to schedule.    Goals Addressed             This Visit's Progress    COMPLETED: Care Coordination Activies/ No Follow up Required       Care Coordination Interventions: No AWV / message sent to office to schedule appointment         SDOH assessments and interventions completed:  Yes  SDOH Interventions Today    Flowsheet Row Most Recent Value  SDOH Interventions   Food Insecurity Interventions Intervention Not Indicated  Housing Interventions Intervention Not Indicated  Transportation Interventions Intervention Not Indicated       Care Coordination Interventions Activated:  Yes  Care Coordination Interventions:  Yes, provided   Follow up plan: No further intervention required.   Encounter Outcome:  Pt. Visit Completed   Janet Steele, Janet Steele [phone]

## 2022-07-13 NOTE — Patient Instructions (Signed)
Visit Information  Thank you for taking time to visit with me today. Please don't hesitate to contact me if I can be of assistance to you.   Following are the goals we discussed today:   Goals Addressed             This Visit's Progress    COMPLETED: Care Coordination Activies/ No Follow up Required       Care Coordination Interventions: No AWV / message sent to office to schedule appointment         Please call the care guide team at [phone] if you need to cancel or reschedule your appointment.    The patient verbalized understanding of instructions, educational materials, and care plan provided today and DECLINED offer to receive copy of patient instructions, educational materials, and care plan.   No further follow up required: care coordination   Casimer Lanius, [place] [phone]

## 2022-07-20 ENCOUNTER — Other Ambulatory Visit: Payer: Self-pay | Admitting: Internal Medicine

## 2022-07-26 ENCOUNTER — Encounter: Payer: Self-pay | Admitting: Internal Medicine

## 2022-07-26 NOTE — Progress Notes (Unsigned)
Subjective:    Patient ID: Janet Steele, female    DOB: 07-21-1929, 88 y.o.   MRN: [id number]     HPI Janet Steele is here for follow up of her chronic medical problems, including chronic cough related to dysphagia, DM< hld, CKD, hypothyroid, htn, memory loss.  She is here today with her son.  Started her on aricept in may.  No obvious side effects.  No issues taking medications - sometimes she will not take them.  They do have someone during the week that helps encourage her to take them, but on the weekends they do not.    She continues to have difficulty with dysphagia.  Her and her son state coughing while eating and after eating.  She does have difficulty swallowing and feeling like her food gets stuck at times.  She has had episodes of vomiting-she states the food just comes up.  They do try to put thickener in liquid when they can.     Woke up about 2 weeks ago with right ear pain.  She states decreased hearing in both ears.  She has chronic hearing loss, but feels like it has gotten worse.  She has hearing aids but does not wear them.  She typically wears amplifiers for the TV.    Medications and allergies reviewed with patient and updated if appropriate.  Current Outpatient Medications on File Prior to Visit  Medication Sig Dispense Refill   aspirin EC 81 MG tablet Take 81 mg by mouth daily.     atorvastatin (LIPITOR) 80 MG tablet TAKE 1 TABLET BY MOUTH EVERY DAY AT BEDTIME 90 tablet 0   clopidogrel (PLAVIX) 75 MG tablet TAKE 1 TABLET(75 MG) BY MOUTH DAILY. FOLLOW-UP 90 tablet 0   donepezil (ARICEPT) 5 MG tablet Take 1 tablet (5 mg total) by mouth at bedtime. 30 tablet 5   DULoxetine (CYMBALTA) 60 MG capsule TAKE 1 CAPSULE(60 MG) BY MOUTH DAILY 90 capsule 1   glimepiride (AMARYL) 1 MG tablet TAKE 1 TABLET BY MOUTH EVERY MORNING 103 tablet 0   levothyroxine (SYNTHROID) 125 MCG tablet TAKE 1 TABLET(125 MCG) BY MOUTH DAILY 90 tablet 3   losartan (COZAAR) 25 MG tablet TAKE  1 TABLET(25 MG) BY MOUTH DAILY 90 tablet 0   metoprolol tartrate (LOPRESSOR) 25 MG tablet Take 1 tablet (25 mg total) by mouth 2 (two) times daily. 180 tablet 1   nitroGLYCERIN (NITROSTAT) 0.4 MG SL tablet Place 1 tablet (0.4 mg total) under the tongue every 5 (five) minutes as needed. For chest pain 25 tablet 5   pantoprazole (PROTONIX) 40 MG tablet TAKE 1 TABLET(40 MG) BY MOUTH DAILY 90 tablet 1   triamcinolone cream (KENALOG) 0.1 %      No current facility-administered medications on file prior to visit.     Review of Systems  Constitutional:  Negative for fever.  HENT:  Positive for ear pain, hearing loss and trouble swallowing.   Respiratory:  Positive for cough (while eating) and wheezing. Negative for shortness of breath.   Cardiovascular:  Negative for chest pain, palpitations and leg swelling.  Gastrointestinal:  Positive for vomiting.       Jerrye Bushy only occasion  Neurological:  Negative for dizziness, light-headedness and headaches.       Objective:   Vitals:   07/27/22 1400  BP: 98/68  Pulse: 75  Temp: 97.9 F (36.6 C)  SpO2: 94%   BP Readings from Last 3 Encounters:  07/27/22 98/68  07/07/22  110/60  06/28/22 (!) 100/50   Wt Readings from Last 3 Encounters:  07/07/22 102 lb (46.3 kg)  06/28/22 100 lb 8 oz (45.6 kg)  01/06/22 109 lb 4 oz (49.6 kg)   Body mass index is 19.92 kg/m.    Physical Exam Constitutional:      General: She is not in acute distress.    Appearance: Normal appearance.  HENT:     Head: Normocephalic and atraumatic.  Eyes:     Conjunctiva/sclera: Conjunctivae normal.  Cardiovascular:     Rate and Rhythm: Normal rate and regular rhythm.     Heart sounds: Normal heart sounds. No murmur heard. Pulmonary:     Effort: Pulmonary effort is normal. No respiratory distress.     Breath sounds: Normal breath sounds. No wheezing.  Musculoskeletal:     Cervical back: Neck supple.     Right lower leg: No edema.     Left lower leg: No edema.   Lymphadenopathy:     Cervical: No cervical adenopathy.  Skin:    General: Skin is warm and dry.     Findings: No rash.  Neurological:     Mental Status: She is alert. Mental status is at baseline.  Psychiatric:        Mood and Affect: Mood normal.        Behavior: Behavior normal.        Lab Results  Component Value Date   WBC 5.4 01/25/2022   HGB 11.2 (L) 01/25/2022   HCT 34.8 (L) 01/25/2022   PLT 337.0 01/25/2022   GLUCOSE 155 (H) 01/25/2022   CHOL 166 08/26/2021   TRIG 153.0 (H) 08/26/2021   HDL 34.40 (L) 08/26/2021   LDLDIRECT 254.0 11/01/2017   LDLCALC 101 (H) 08/26/2021   ALT 10 01/25/2022   AST 18 01/25/2022   NA 135 01/25/2022   K 4.9 01/25/2022   CL 105 01/25/2022   CREATININE 1.40 (H) 01/25/2022   BUN 27 (H) 01/25/2022   CO2 26 01/25/2022   TSH 1.90 01/25/2022   INR 0.99 04/04/2018   HGBA1C 7.2 (H) 01/25/2022   MICROALBUR 3.6 (H) 05/28/2010     Assessment & Plan:    See Problem List for Assessment and Plan of chronic medical problems.

## 2022-07-26 NOTE — Patient Instructions (Addendum)
     Blood work was ordered.     Medications changes include :  stop losartan     A referral was ordered for Dr Henrene Pastor.     Someone from that office will call you to schedule an appointment.    Return in about 6 months (around 01/27/2023) for follow up.

## 2022-07-27 ENCOUNTER — Ambulatory Visit (INDEPENDENT_AMBULATORY_CARE_PROVIDER_SITE_OTHER): Payer: Medicare Other | Admitting: Internal Medicine

## 2022-07-27 VITALS — BP 98/68 | HR 75 | Temp 97.9°F | Ht 60.0 in

## 2022-07-27 DIAGNOSIS — G3184 Mild cognitive impairment, so stated: Secondary | ICD-10-CM | POA: Diagnosis not present

## 2022-07-27 DIAGNOSIS — I200 Unstable angina: Secondary | ICD-10-CM

## 2022-07-27 DIAGNOSIS — I10 Essential (primary) hypertension: Secondary | ICD-10-CM | POA: Diagnosis not present

## 2022-07-27 DIAGNOSIS — E039 Hypothyroidism, unspecified: Secondary | ICD-10-CM | POA: Diagnosis not present

## 2022-07-27 DIAGNOSIS — F3289 Other specified depressive episodes: Secondary | ICD-10-CM

## 2022-07-27 DIAGNOSIS — N1832 Chronic kidney disease, stage 3b: Secondary | ICD-10-CM | POA: Diagnosis not present

## 2022-07-27 DIAGNOSIS — E1165 Type 2 diabetes mellitus with hyperglycemia: Secondary | ICD-10-CM | POA: Diagnosis not present

## 2022-07-27 DIAGNOSIS — E782 Mixed hyperlipidemia: Secondary | ICD-10-CM | POA: Diagnosis not present

## 2022-07-27 DIAGNOSIS — F419 Anxiety disorder, unspecified: Secondary | ICD-10-CM | POA: Diagnosis not present

## 2022-07-27 DIAGNOSIS — R1319 Other dysphagia: Secondary | ICD-10-CM | POA: Diagnosis not present

## 2022-07-27 LAB — CBC WITH DIFFERENTIAL/PLATELET
Basophils Absolute: 0 10*3/uL (ref 0.0–0.1)
Basophils Relative: 0.4 % (ref 0.0–3.0)
Eosinophils Absolute: 0.1 10*3/uL (ref 0.0–0.7)
Eosinophils Relative: 0.9 % (ref 0.0–5.0)
HCT: 35.3 % — ABNORMAL LOW (ref 36.0–46.0)
Hemoglobin: 11.8 g/dL — ABNORMAL LOW (ref 12.0–15.0)
Lymphocytes Relative: 18.3 % (ref 12.0–46.0)
Lymphs Abs: 1.4 10*3/uL (ref 0.7–4.0)
MCHC: 33.4 g/dL (ref 30.0–36.0)
MCV: 83.9 fl (ref 78.0–100.0)
Monocytes Absolute: 0.4 10*3/uL (ref 0.1–1.0)
Monocytes Relative: 5.5 % (ref 3.0–12.0)
Neutro Abs: 5.7 10*3/uL (ref 1.4–7.7)
Neutrophils Relative %: 74.9 % (ref 43.0–77.0)
Platelets: 258 10*3/uL (ref 150.0–400.0)
RBC: 4.2 Mil/uL (ref 3.87–5.11)
RDW: 14.4 % (ref 11.5–15.5)
WBC: 7.5 10*3/uL (ref 4.0–10.5)

## 2022-07-27 LAB — TSH: TSH: 0.06 u[IU]/mL — ABNORMAL LOW (ref 0.35–5.50)

## 2022-07-27 LAB — HEMOGLOBIN A1C: Hgb A1c MFr Bld: 6.7 % — ABNORMAL HIGH (ref 4.6–6.5)

## 2022-07-27 NOTE — Assessment & Plan Note (Signed)
Chronic Likely multifactorial She has had modified barium swallow eval earlier this year and saw speech therapy and got the recommendations History esophageal stricture with multiple dilations in the past Trouble swallowing now likely related to esophageal stricture Will refer back to Dr Henrene Pastor for possible EGD/ dilation

## 2022-07-27 NOTE — Assessment & Plan Note (Signed)
Chronic Check lipid panel  Continue atorvastatin 80 mg daily

## 2022-07-27 NOTE — Assessment & Plan Note (Signed)
Chronic Clinically hypothyroid Continue levothyroxine 125 mcg daily Check TSH

## 2022-07-27 NOTE — Assessment & Plan Note (Signed)
Chronic CMP We will discontinue losartan secondary to low BP

## 2022-07-27 NOTE — Assessment & Plan Note (Signed)
Chronic Continue Aricept 5 mg at bedtime-no obvious side effects, but not sure if she is taking this on a regular basis Can consider increasing to 10 mg

## 2022-07-27 NOTE — Assessment & Plan Note (Signed)
Chronic Controlled, Stable Continue duloxetine 60 mg daily 

## 2022-07-27 NOTE — Assessment & Plan Note (Signed)
Chronic Blood pressure on the low side-we will discontinue losartan 25 mg daily Continue metoprolol 25 mg twice daily CMP

## 2022-07-27 NOTE — Assessment & Plan Note (Signed)
Chronic Seems to be controlled Continue duloxetine 60 mg daily

## 2022-07-27 NOTE — Assessment & Plan Note (Signed)
Chronic Check A1c Continue glimepiride 1 mg every morning

## 2022-07-28 LAB — COMPREHENSIVE METABOLIC PANEL
ALT: 14 U/L (ref 0–35)
AST: 17 U/L (ref 0–37)
Albumin: 4 g/dL (ref 3.5–5.2)
Alkaline Phosphatase: 95 U/L (ref 39–117)
BUN: 29 mg/dL — ABNORMAL HIGH (ref 6–23)
CO2: 24 mEq/L (ref 19–32)
Calcium: 9.1 mg/dL (ref 8.4–10.5)
Chloride: 104 mEq/L (ref 96–112)
Creatinine, Ser: 1.28 mg/dL — ABNORMAL HIGH (ref 0.40–1.20)
GFR: 36.81 mL/min — ABNORMAL LOW (ref >60.00)
Glucose, Bld: 156 mg/dL — ABNORMAL HIGH (ref 70–99)
Potassium: 4.3 mEq/L (ref 3.5–5.1)
Sodium: 136 mEq/L (ref 135–145)
Total Bilirubin: 1 mg/dL (ref 0.2–1.2)
Total Protein: 6.7 g/dL (ref 6.0–8.3)

## 2022-07-28 LAB — LIPID PANEL
Cholesterol: 143 mg/dL (ref 0–200)
HDL: 35.3 mg/dL — ABNORMAL LOW (ref >39.00)
LDL Cholesterol: 79 mg/dL (ref 0–99)
NonHDL: 107.34
Total CHOL/HDL Ratio: 4
Triglycerides: 142 mg/dL (ref 0.0–149.0)
VLDL: 28.4 mg/dL (ref 0.0–40.0)

## 2022-07-28 MED ORDER — LEVOTHYROXINE SODIUM 112 MCG PO TABS: 112.0000 ug | ORAL_TABLET | Freq: Every day | ORAL | 3 refills | Status: DC

## 2022-07-28 NOTE — Addendum Note (Signed)
Addended by: Binnie Rail on: 07/28/2022 07:52 AM   Modules accepted: Orders

## 2022-08-03 ENCOUNTER — Telehealth: Payer: Medicare Other

## 2022-08-11 ENCOUNTER — Other Ambulatory Visit: Payer: Self-pay | Admitting: Internal Medicine

## 2022-08-11 DIAGNOSIS — I200 Unstable angina: Secondary | ICD-10-CM

## 2022-08-11 DIAGNOSIS — I25700 Atherosclerosis of coronary artery bypass graft(s), unspecified, with unstable angina pectoris: Secondary | ICD-10-CM

## 2022-08-18 ENCOUNTER — Ambulatory Visit: Payer: Medicare Other | Admitting: Gastroenterology

## 2022-08-30 ENCOUNTER — Telehealth: Payer: Self-pay

## 2022-08-30 NOTE — Telephone Encounter (Signed)
MEDICATION: pantoprazole (PROTONIX) 40 MG tablet  PHARMACY: [facility] [phone] - [place], [place] [address]  Comments:  Patient's husband wants to know if she continue taking it   **Let patient know to contact pharmacy at the end of the day to make sure medication is ready. **  ** Please notify patient to allow 48-72 hours to process**  **Encourage patient to contact the pharmacy for refills or they can request refills through [facility]**

## 2022-08-31 ENCOUNTER — Other Ambulatory Visit: Payer: Self-pay

## 2022-08-31 MED ORDER — PANTOPRAZOLE SODIUM 40 MG PO TBEC: DELAYED_RELEASE_TABLET | ORAL | 1 refills | Status: DC

## 2022-08-31 NOTE — Telephone Encounter (Signed)
Sent in today for patient. 

## 2022-09-01 DIAGNOSIS — H43813 Vitreous degeneration, bilateral: Secondary | ICD-10-CM | POA: Diagnosis not present

## 2022-09-01 DIAGNOSIS — H353122 Nonexudative age-related macular degeneration, left eye, intermediate dry stage: Secondary | ICD-10-CM | POA: Diagnosis not present

## 2022-09-01 DIAGNOSIS — H353212 Exudative age-related macular degeneration, right eye, with inactive choroidal neovascularization: Secondary | ICD-10-CM | POA: Diagnosis not present

## 2022-09-01 DIAGNOSIS — H43393 Other vitreous opacities, bilateral: Secondary | ICD-10-CM | POA: Diagnosis not present

## 2022-09-04 ENCOUNTER — Other Ambulatory Visit: Payer: Self-pay | Admitting: Internal Medicine

## 2022-09-07 ENCOUNTER — Ambulatory Visit (INDEPENDENT_AMBULATORY_CARE_PROVIDER_SITE_OTHER): Payer: Medicare Other | Admitting: Gastroenterology

## 2022-09-07 ENCOUNTER — Telehealth: Payer: Self-pay

## 2022-09-07 ENCOUNTER — Encounter: Payer: Self-pay | Admitting: Gastroenterology

## 2022-09-07 VITALS — BP 120/60 | HR 81 | Ht 60.0 in | Wt 100.0 lb

## 2022-09-07 DIAGNOSIS — Z8719 Personal history of other diseases of the digestive system: Secondary | ICD-10-CM

## 2022-09-07 DIAGNOSIS — K219 Gastro-esophageal reflux disease without esophagitis: Secondary | ICD-10-CM | POA: Diagnosis not present

## 2022-09-07 DIAGNOSIS — R1310 Dysphagia, unspecified: Secondary | ICD-10-CM | POA: Diagnosis not present

## 2022-09-07 DIAGNOSIS — I200 Unstable angina: Secondary | ICD-10-CM

## 2022-09-07 MED ORDER — PANTOPRAZOLE SODIUM 40 MG PO TBEC: 40.0000 mg | DELAYED_RELEASE_TABLET | Freq: Two times a day (BID) | ORAL | 5 refills | Status: DC

## 2022-09-07 NOTE — Telephone Encounter (Signed)
Dr. Burt Knack, you recently saw this patient on 07/07/2022 and we have now received a request from Dr. Henrene Pastor for upcoming EGD with request to hold Plavix for 5 days prior to procedure. Can you please comment on clearance as well as holding antiplatelet medication.  Please direct your response to p cv div preop.  Thank you Emmaline Life, NP-C  09/07/2022, 3:52 PM [address] Office [phone] Fax [phone]

## 2022-09-07 NOTE — Progress Notes (Signed)
Reviewed.  Janett Billow, thank you for seeing Mrs. Allen.  She is an original!  PLEASE make sure it is okay to have for Plavix held.  Thanks,  Dr.

## 2022-09-07 NOTE — Telephone Encounter (Signed)
[facility] Pre-operative Risk Assessment     Request for surgical clearance:     Endoscopy Procedure  What type of surgery is being performed?     EGD  When is this surgery scheduled?     10/04/22  What type of clearance is required ?   Pharmacy  Are there any medications that need to be held prior to surgery and how long? Plavix for 5 days  Practice name and name of physician performing surgery?   Dr. Henrene Pastor,   [facility]  What is your office phone and fax number?      Phone- [phone]  [phone]  Anesthesia type (None, local, MAC, general) ?       MAC

## 2022-09-07 NOTE — Progress Notes (Signed)
09/07/2022 Janet Steele [id number] [birth year]   HISTORY OF PRESENT ILLNESS:  This is a 88 year old female who is a patient of Dr. Blanch Media who presents here today with complaints of dysphagia and a lot of coughing with congestion and mucus production.  Her son is present as well.  She does have a history of esophageal stricture that was last dilated in August 2021.  I saw her for the same issue in February of this year.  She told me she was not sure that the last dilation really helped her much.  We decided to proceed with esophagram with results as follows:  Esophagram 01/2022:  IMPRESSION: 1. Notable limitations on today's exam as the patient was unable to stand, could only take small sips, and also experienced substantial coughing at certain parts of the exam. Most of the exam was performed with the patient in the LPO position. The pharyngeal phase was not assessed. 2. Nonspecific esophageal dysmotility disorder with disruption of primary peristaltic waves in the mid esophagus on all swallows. There was some secondary contraction clearance of the distal esophagus and substantial proximal escape. 3. Possible smooth stricture of the distal esophagus. A 13 mm barium tablet impacted in this vicinity. 4. Substantial coughing episodes with swallows of water after swallowing the pill. This could possibly indicate laryngeal penetration or aspiration, correlate with any recent speech pathology assessment.  They declined to schedule EGD at that time, but agreed to have a modified barium swallow study performed.  That study did show some decreased bolus cohesion, premature spillage, trace aspiration, etc.  They had recommended that she follow with them twice a week, which was not done.  She comes in with her son again today for the same complaints.  She complains of intermittent issues with her food not wanting to go down and has to vomit it back up.  She also complains of a lot of mucus  and secretions that causes coughing and congestion sensation.  Is on pantoprazole 40 mg daily.  Reports rare heartburn or indigestion type sensation.  She is on Plavix for history of coronary artery disease.  She is very hard of hearing.    Past Medical History:  Diagnosis Date   Anemia    Anxiety    Arthritis    "fingers" (05/19/2016   CAD (coronary artery disease)    a. s/p CABG 1982 b. redo CABG 2003. c. [place] despite med rx 05/2016: successful but complicated PCI of PDA beyond graft site, c/b localized dissection requiring overlapping stent.   Carotid bruit    a. Duplex 01/2015: patent vessels, 1-39% BICA, f/u PRN recommened.   Colon polyp    adenomatous   Colon, diverticulosis    Degenerative joint disease    Diastolic dysfunction    Esophageal stricture    GERD (gastroesophageal reflux disease)    Hiatal hernia    History of blood transfusion    "related to my bypass"   Hyperlipidemia    Hypertension    Hypothyroidism    Ischemic cardiomyopathy    a. Cath 05/2016: EF 40% with severe inferior wall HK, suspect hibernating myocardium.   Myocardial infarction ([place]) 1977; 1982   Osteoarthrosis, unspecified whether generalized or localized, unspecified site    Presence of permanent cardiac pacemaker    PVD (peripheral vascular disease) ([place])    Stroke ([place])    greater than 2 years ago per husband   Type II diabetes mellitus ([place])    Unspecified hearing loss  Past Surgical History:  Procedure Laterality Date   ABDOMINAL HYSTERECTOMY     APPENDECTOMY     BACK SURGERY     CARDIAC CATHETERIZATION N/A 05/19/2016   Procedure: Left Heart Cath and Cors/Grafts Angiography;  Surgeon: Belva Crome, MD;  Location: [facility];  Service: Cardiovascular;  Laterality: N/A;   CARDIAC CATHETERIZATION N/A 05/19/2016   Procedure: Coronary Stent Intervention;  Surgeon: Belva Crome, MD;  Location: [facility];  Service: Cardiovascular;  Laterality: N/A;   CATARACT EXTRACTION  W/ INTRAOCULAR LENS  IMPLANT, BILATERAL Bilateral    CHOLECYSTECTOMY OPEN     COLONOSCOPY     CORONARY ANGIOPLASTY WITH STENT PLACEMENT  05/19/2016   "2 stents?"   CORONARY ARTERY BYPASS GRAFT  1982; 2003   x4(1982),02-2002 CABG X2   ESOPHAGOGASTRODUODENOSCOPY (EGD) WITH ESOPHAGEAL DILATION  "2-3 times"   INSERT / REPLACE / REMOVE PACEMAKER     LEFT HEART CATHETERIZATION WITH CORONARY/GRAFT ANGIOGRAM N/A 03/05/2014   Procedure: LEFT HEART CATHETERIZATION WITH Beatrix Fetters;  Surgeon: Sinclair Grooms, MD;  Location: [facility];  Service: Cardiovascular;  Laterality: N/A;   LUMBAR DISC SURGERY  01-2000   OOPHORECTOMY Bilateral    PERMANENT PACEMAKER INSERTION N/A 07/06/2012   MDT Adapta L implanted by Dr Rayann Heman for symptomatic bradycardia   TONSILLECTOMY  1930s   UPPER GASTROINTESTINAL ENDOSCOPY      reports that she quit smoking about 46 years ago. Her smoking use included cigarettes. She has a 15.00 pack-year smoking history. She has never used smokeless tobacco. She reports that she does not drink alcohol and does not use drugs. family history includes Heart attack in her sister; Heart disease in her sister. Allergies  Allergen Reactions   Morphine Hives, Itching and Rash   Penicillins Itching and Rash    Has patient had a PCN reaction causing immediate rash, facial/tongue/throat swelling, SOB or lightheadedness with hypotension: Yes Has patient had a PCN reaction causing severe rash involving mucus membranes or skin necrosis: No Has patient had a PCN reaction that required hospitalization No Has patient had a PCN reaction occurring within the last 10 years: No If all of the above answers are "NO", then may proceed with Cephalosporin use.    Prednisone Other (See Comments)    MEMORY LOSS    Sulfonamide Derivatives Itching and Rash   Codeine Rash   Hydrocodone-Acetaminophen Other (See Comments)    unknown   Meperidine Hcl Itching   Metoclopramide Hcl Itching and Rash    Metoprolol Succinate Other (See Comments)    nervous   Moxifloxacin Itching   Oxycodone-Aspirin Rash   Pentazocine Lactate Nausea Only   Pioglitazone Other (See Comments)    bloating      Outpatient Encounter Medications as of 09/07/2022  Medication Sig   aspirin EC 81 MG tablet Take 81 mg by mouth daily.   atorvastatin (LIPITOR) 80 MG tablet TAKE 1 TABLET BY MOUTH EVERY DAY AT BEDTIME   clopidogrel (PLAVIX) 75 MG tablet TAKE 1 TABLET(75 MG) BY MOUTH DAILY. FOLLOW-UP   donepezil (ARICEPT) 5 MG tablet Take 1 tablet (5 mg total) by mouth at bedtime.   DULoxetine (CYMBALTA) 60 MG capsule TAKE 1 CAPSULE(60 MG) BY MOUTH DAILY   glimepiride (AMARYL) 1 MG tablet TAKE 1 TABLET BY MOUTH EVERY MORNING   levothyroxine (SYNTHROID) 112 MCG tablet Take 1 tablet (112 mcg total) by mouth daily.   metoprolol tartrate (LOPRESSOR) 25 MG tablet TAKE 1 TABLET(25 MG) BY MOUTH TWICE  DAILY   nitroGLYCERIN (NITROSTAT) 0.4 MG SL tablet Place 1 tablet (0.4 mg total) under the tongue every 5 (five) minutes as needed. For chest pain   pantoprazole (PROTONIX) 40 MG tablet Take 1 tablet (40 mg total) by mouth 2 (two) times daily. TAKE 1 TABLET(40 MG) BY MOUTH DAILY   triamcinolone cream (KENALOG) 0.1 %    [DISCONTINUED] pantoprazole (PROTONIX) 40 MG tablet TAKE 1 TABLET(40 MG) BY MOUTH DAILY   No facility-administered encounter medications on file as of 09/07/2022.     REVIEW OF SYSTEMS  : All other systems reviewed and negative except where noted in the History of Present Illness.   PHYSICAL EXAM: BP 120/60   Pulse 81   Ht 5' (1.524 m)   Wt 100 lb (45.4 kg)   BMI 19.53 kg/m  General: Well developed white female in no acute distress Head: Normocephalic and atraumatic Eyes:  Sclerae anicteric, conjunctiva pink. Ears: HOH. Lungs: Clear throughout to auscultation; no W/R/R. Heart: Regular rate and rhythm; no M/R/G. Musculoskeletal: Symmetrical with no gross deformities  Skin: No lesions on visible  extremities Extremities: No edema  Neurological: Alert oriented x 4, grossly non-focal Psychological:  Alert and cooperative. Normal mood and affect  ASSESSMENT AND PLAN: *Dysphagia, GERD, history of esophageal stricture: Here again today for similar complaints as to when she was seen earlier this year.  Esophagram did suggest an esophageal stricture, but she was not convinced that the last dilation helped so we held off on repeat EGD.  She also has esophageal dysmotility and there was some concern for aspiration.  She was seen by speech and language pathology for modified barium swallow study and they recommend that she follow with them for period of time, which she did not do.  Continues to complain of food not wanting to go down, but also a lot of mucus, congestion, coughing.  I discussed with the patient and her son.  Cannot promise that EGD and dilation will help with all of her symptoms.  They would like to proceed with another EGD with dilation with Dr. Henrene Pastor.  They are aware that if it does not help then it is likely due to the dysmotility and her oropharyngeal swallowing issues.  I am also going to increase her pantoprazole to twice daily to see if that helps.  New prescription sent to pharmacy. *Chronic antiplatelet use:  Hold Plavix 5 days before procedure - will instruct when and how to resume after procedure. Risks and benefits of procedure including bleeding, perforation, infection, missed lesions, medication reactions and possible hospitalization or surgery if complications occur explained. Additional rare but real risk of cardiovascular event such as heart attack or ischemia/infarct of other organs off of Plavix explained and need to seek urgent help if this occurs. Will communicate by phone or EMR with patient's prescribing provider to confirm holding Plavix is reasonable in this case.     CC:  Binnie Rail, MD

## 2022-09-07 NOTE — Patient Instructions (Addendum)
  _______________________________________________________  Increase Pantoprazole two times daily.  You will be contaced by our office prior to your procedure for directions on holding your Plavix.  If you do not hear from our office 1 week prior to your scheduled procedure, please call [phone] to discuss.   You have been scheduled for an endoscopy. Please follow written instructions given to you at your visit today. If you use inhalers (even only as needed), please bring them with you on the day of your procedure.   If you are age 70 or younger, your body mass index should be between 19-25. Your Body mass index is 19.53 kg/m. If this is out of the aformentioned range listed, please consider follow up with your Primary Care Provider.   ________________________________________________________  The [facility] providers would like to encourage you to use [facility] to communicate with providers for non-urgent requests or questions.  Due to long hold times on the telephone, sending your provider a message by [facility] may be a faster and more efficient way to get a response.  Please allow 48 business hours for a response.  Please remember that this is for non-urgent requests.  _______________________________________________________  Due to recent changes in healthcare laws, you may see the results of your imaging and laboratory studies on MyChart before your provider has had a chance to review them.  We understand that in some cases there may be results that are confusing or concerning to you. Not all laboratory results come back in the same time frame and the provider may be waiting for multiple results in order to interpret others.  Please give [place] 48 hours in order for your provider to thoroughly review all the results before contacting the office for clarification of your results.   Thank you for choosing me and [facility].  Alonza Bogus, PA-C

## 2022-09-12 NOTE — Telephone Encounter (Signed)
Yes - ok to hold plavix x 5 days as requested. thanks

## 2022-09-13 NOTE — Telephone Encounter (Signed)
Spoke with Patient's son Mr. [name]. Made him aware that patient needs to hold plavix for 5 days prior to procedure. Mr. [name] voiced understanding.

## 2022-09-13 NOTE — Telephone Encounter (Signed)
   Primary Cardiologist: Sherren Mocha, MD  Chart reviewed as part of pre-operative protocol coverage. Given past medical history and time since last visit, based on ACC/AHA guidelines, Janet Steele would be at acceptable risk for the planned procedure without further cardiovascular testing.   She may hold Plavix for 5 days prior to procedure and should resume as soon as hemodynamically stable.  I will route this recommendation to the requesting party via Epic fax function and remove from pre-op pool.  Please call with questions.  Emmaline Life, NP-C  09/13/2022, 6:22 AM [address], [address] Office [phone] Fax [phone]

## 2022-09-14 DIAGNOSIS — Z23 Encounter for immunization: Secondary | ICD-10-CM | POA: Diagnosis not present

## 2022-10-04 ENCOUNTER — Ambulatory Visit (AMBULATORY_SURGERY_CENTER): Payer: Medicare Other | Admitting: Internal Medicine

## 2022-10-04 ENCOUNTER — Encounter: Payer: Self-pay | Admitting: Internal Medicine

## 2022-10-04 VITALS — BP 146/56 | HR 61 | Temp 97.8°F | Resp 15 | Ht 60.0 in | Wt 100.0 lb

## 2022-10-04 DIAGNOSIS — R1310 Dysphagia, unspecified: Secondary | ICD-10-CM

## 2022-10-04 DIAGNOSIS — K222 Esophageal obstruction: Secondary | ICD-10-CM

## 2022-10-04 DIAGNOSIS — K317 Polyp of stomach and duodenum: Secondary | ICD-10-CM | POA: Diagnosis not present

## 2022-10-04 DIAGNOSIS — K219 Gastro-esophageal reflux disease without esophagitis: Secondary | ICD-10-CM | POA: Diagnosis not present

## 2022-10-04 DIAGNOSIS — Z8719 Personal history of other diseases of the digestive system: Secondary | ICD-10-CM | POA: Diagnosis not present

## 2022-10-04 MED ORDER — SODIUM CHLORIDE 0.9 % IV SOLN: 500.0000 mL | Freq: Once | INTRAVENOUS | Status: DC

## 2022-10-04 NOTE — Progress Notes (Signed)
Called to room to assist during endoscopic procedure.  Patient ID and intended procedure confirmed with present staff. Received instructions for my participation in the procedure from the performing physician.  

## 2022-10-04 NOTE — Progress Notes (Signed)
09/07/2022 Janet Steele [id number] 07-08-1929     HISTORY OF PRESENT ILLNESS:  This is a 88 year old female who is a patient of Dr. Blanch Media who presents here today with complaints of dysphagia and a lot of coughing with congestion and mucus production.  Her son is present as well.  She does have a history of esophageal stricture that was last dilated in August 2021.  I saw her for the same issue in February of this year.  She told me she was not sure that the last dilation really helped her much.  We decided to proceed with esophagram with results as follows:   Esophagram 01/2022:   IMPRESSION: 1. Notable limitations on today's exam as the patient was unable to stand, could only take small sips, and also experienced substantial coughing at certain parts of the exam. Most of the exam was performed with the patient in the LPO position. The pharyngeal phase was not assessed. 2. Nonspecific esophageal dysmotility disorder with disruption of primary peristaltic waves in the mid esophagus on all swallows. There was some secondary contraction clearance of the distal esophagus and substantial proximal escape. 3. Possible smooth stricture of the distal esophagus. A 13 mm barium tablet impacted in this vicinity. 4. Substantial coughing episodes with swallows of water after swallowing the pill. This could possibly indicate laryngeal penetration or aspiration, correlate with any recent speech pathology assessment.   They declined to schedule EGD at that time, but agreed to have a modified barium swallow study performed.  That study did show some decreased bolus cohesion, premature spillage, trace aspiration, etc.  They had recommended that she follow with them twice a week, which was not done.   She comes in with her son again today for the same complaints.  She complains of intermittent issues with her food not wanting to go down and has to vomit it back up.  She also complains of a lot of mucus and  secretions that causes coughing and congestion sensation.  Is on pantoprazole 40 mg daily.  Reports rare heartburn or indigestion type sensation.   She is on Plavix for history of coronary artery disease.   She is very hard of hearing.         Past Medical History:  Diagnosis Date   Anemia     Anxiety     Arthritis      "fingers" (05/19/2016   CAD (coronary artery disease)      a. s/p CABG 1982 b. redo CABG 2003. c. [place] despite med rx 05/2016: successful but complicated PCI of PDA beyond graft site, c/b localized dissection requiring overlapping stent.   Carotid bruit      a. Duplex 01/2015: patent vessels, 1-39% BICA, f/u PRN recommened.   Colon polyp      adenomatous   Colon, diverticulosis     Degenerative joint disease     Diastolic dysfunction     Esophageal stricture     GERD (gastroesophageal reflux disease)     Hiatal hernia     History of blood transfusion      "related to my bypass"   Hyperlipidemia     Hypertension     Hypothyroidism     Ischemic cardiomyopathy      a. Cath 05/2016: EF 40% with severe inferior wall HK, suspect hibernating myocardium.   Myocardial infarction ([place]) 1977; 1982   Osteoarthrosis, unspecified whether generalized or localized, unspecified site     Presence of permanent cardiac pacemaker  PVD (peripheral vascular disease) ([place])     Stroke ([place])      greater than 2 years ago per husband   Type II diabetes mellitus ([place])     Unspecified hearing loss           Past Surgical History:  Procedure Laterality Date   ABDOMINAL HYSTERECTOMY       APPENDECTOMY       BACK SURGERY       CARDIAC CATHETERIZATION N/A 05/19/2016    Procedure: Left Heart Cath and Cors/Grafts Angiography;  Surgeon: Belva Crome, MD;  Location: [facility];  Service: Cardiovascular;  Laterality: N/A;   CARDIAC CATHETERIZATION N/A 05/19/2016    Procedure: Coronary Stent Intervention;  Surgeon: Belva Crome, MD;  Location: [place] [facility];  Service:  Cardiovascular;  Laterality: N/A;   CATARACT EXTRACTION W/ INTRAOCULAR LENS  IMPLANT, BILATERAL Bilateral     CHOLECYSTECTOMY OPEN       COLONOSCOPY       CORONARY ANGIOPLASTY WITH STENT PLACEMENT   05/19/2016    "2 stents?"   CORONARY ARTERY BYPASS GRAFT   1982; 2003    x4(1982),02-2002 CABG X2   ESOPHAGOGASTRODUODENOSCOPY (EGD) WITH ESOPHAGEAL DILATION   "2-3 times"   INSERT / REPLACE / REMOVE PACEMAKER       LEFT HEART CATHETERIZATION WITH CORONARY/GRAFT ANGIOGRAM N/A 03/05/2014    Procedure: LEFT HEART CATHETERIZATION WITH Beatrix Fetters;  Surgeon: Sinclair Grooms, MD;  Location: [facility];  Service: Cardiovascular;  Laterality: N/A;   LUMBAR DISC SURGERY   01-2000   OOPHORECTOMY Bilateral     PERMANENT PACEMAKER INSERTION N/A 07/06/2012    MDT Adapta L implanted by Dr Rayann Heman for symptomatic bradycardia   TONSILLECTOMY   1930s   UPPER GASTROINTESTINAL ENDOSCOPY         reports that she quit smoking about 46 years ago. Her smoking use included cigarettes. She has a 15.00 pack-year smoking history. She has never used smokeless tobacco. She reports that she does not drink alcohol and does not use drugs. family history includes Heart attack in her sister; Heart disease in her sister.      Allergies  Allergen Reactions   Morphine Hives, Itching and Rash   Penicillins Itching and Rash      Has patient had a PCN reaction causing immediate rash, facial/tongue/throat swelling, SOB or lightheadedness with hypotension: Yes Has patient had a PCN reaction causing severe rash involving mucus membranes or skin necrosis: No Has patient had a PCN reaction that required hospitalization No Has patient had a PCN reaction occurring within the last 10 years: No If all of the above answers are "NO", then may proceed with Cephalosporin use.     Prednisone Other (See Comments)      MEMORY LOSS    Sulfonamide Derivatives Itching and Rash   Codeine Rash   Hydrocodone-Acetaminophen Other (See  Comments)      unknown   Meperidine Hcl Itching   Metoclopramide Hcl Itching and Rash   Metoprolol Succinate Other (See Comments)      nervous   Moxifloxacin Itching   Oxycodone-Aspirin Rash   Pentazocine Lactate Nausea Only   Pioglitazone Other (See Comments)      bloating            Outpatient Encounter Medications as of 09/07/2022  Medication Sig   aspirin EC 81 MG tablet Take 81 mg by mouth daily.   atorvastatin (LIPITOR) 80 MG tablet TAKE 1 TABLET  BY MOUTH EVERY DAY AT BEDTIME   clopidogrel (PLAVIX) 75 MG tablet TAKE 1 TABLET(75 MG) BY MOUTH DAILY. FOLLOW-UP   donepezil (ARICEPT) 5 MG tablet Take 1 tablet (5 mg total) by mouth at bedtime.   DULoxetine (CYMBALTA) 60 MG capsule TAKE 1 CAPSULE(60 MG) BY MOUTH DAILY   glimepiride (AMARYL) 1 MG tablet TAKE 1 TABLET BY MOUTH EVERY MORNING   levothyroxine (SYNTHROID) 112 MCG tablet Take 1 tablet (112 mcg total) by mouth daily.   metoprolol tartrate (LOPRESSOR) 25 MG tablet TAKE 1 TABLET(25 MG) BY MOUTH TWICE DAILY   nitroGLYCERIN (NITROSTAT) 0.4 MG SL tablet Place 1 tablet (0.4 mg total) under the tongue every 5 (five) minutes as needed. For chest pain   pantoprazole (PROTONIX) 40 MG tablet Take 1 tablet (40 mg total) by mouth 2 (two) times daily. TAKE 1 TABLET(40 MG) BY MOUTH DAILY   triamcinolone cream (KENALOG) 0.1 %     [DISCONTINUED] pantoprazole (PROTONIX) 40 MG tablet TAKE 1 TABLET(40 MG) BY MOUTH DAILY    No facility-administered encounter medications on file as of 09/07/2022.        REVIEW OF SYSTEMS  : All other systems reviewed and negative except where noted in the History of Present Illness.     PHYSICAL EXAM: BP 120/60   Pulse 81   Ht 5' (1.524 m)   Wt 100 lb (45.4 kg)   BMI 19.53 kg/m  General: Well developed white female in no acute distress Head: Normocephalic and atraumatic Eyes:  Sclerae anicteric, conjunctiva pink. Ears: HOH. Lungs: Clear throughout to auscultation; no W/R/R. Heart: Regular rate and  rhythm; no M/R/G. Musculoskeletal: Symmetrical with no gross deformities  Skin: No lesions on visible extremities Extremities: No edema  Neurological: Alert oriented x 4, grossly non-focal Psychological:  Alert and cooperative. Normal mood and affect   ASSESSMENT AND PLAN: *Dysphagia, GERD, history of esophageal stricture: Here again today for similar complaints as to when she was seen earlier this year.  Esophagram did suggest an esophageal stricture, but she was not convinced that the last dilation helped so we held off on repeat EGD.  She also has esophageal dysmotility and there was some concern for aspiration.  She was seen by speech and language pathology for modified barium swallow study and they recommend that she follow with them for period of time, which she did not do.  Continues to complain of food not wanting to go down, but also a lot of mucus, congestion, coughing.  I discussed with the patient and her son.  Cannot promise that EGD and dilation will help with all of her symptoms.  They would like to proceed with another EGD with dilation with Dr. Henrene Pastor.  They are aware that if it does not help then it is likely due to the dysmotility and her oropharyngeal swallowing issues.  I am also going to increase her pantoprazole to twice daily to see if that helps.  New prescription sent to pharmacy. *Chronic antiplatelet use:  Hold Plavix 5 days before procedure - will instruct when and how to resume after procedure. Risks and benefits of procedure including bleeding, perforation, infection, missed lesions, medication reactions and possible hospitalization or surgery if complications occur explained. Additional rare but real risk of cardiovascular event such as heart attack or ischemia/infarct of other organs off of Plavix explained and need to seek urgent help if this occurs. Will communicate by phone or EMR with patient's prescribing provider to confirm holding Plavix is reasonable in this case.

## 2022-10-04 NOTE — Op Note (Signed)
[place] Patient Name: Janet Steele Procedure Date: 10/04/2022 10:08 AM MRN: [id number] Endoscopist: Docia Chuck. Henrene Pastor , MD, [id number] Age: 88 Referring MD:  Date of Birth: 02-11-1929 Gender: Female Account #: [id number] Procedure:                Upper GI endoscopy with balloon dilation of the                            esophagus. 18-20 mm Indications:              Dysphagia, Esophageal reflux, Nausea with vomiting Medicines:                Monitored Anesthesia Care Procedure:                Pre-Anesthesia Assessment:                           - Prior to the procedure, a History and Physical                            was performed, and patient medications and                            allergies were reviewed. The patient's tolerance of                            previous anesthesia was also reviewed. The risks                            and benefits of the procedure and the sedation                            options and risks were discussed with the patient.                            All questions were answered, and informed consent                            was obtained. Prior Anticoagulants: The patient has                            taken Plavix (clopidogrel), last dose was 5 days                            prior to procedure. ASA Grade Assessment: III - A                            patient with severe systemic disease. After                            reviewing the risks and benefits, the patient was                            deemed in satisfactory condition to undergo the  procedure.                           After obtaining informed consent, the endoscope was                            passed under direct vision. Throughout the                            procedure, the patient's blood pressure, pulse, and                            oxygen saturations were monitored continuously. The                            Endoscope was introduced  through the mouth, and                            advanced to the second part of duodenum. The upper                            GI endoscopy was accomplished without difficulty.                            The patient tolerated the procedure well. Scope In: Scope Out: Findings:                 One benign-appearing, intrinsic moderate stenosis                            was found 30 cm from the incisors. This stenosis                            measured 1.5 cm (inner diameter). A TTS dilator was                            passed through the scope. Dilation with an 18-19-20                            mm balloon dilator was performed to 20 mm. The                            fiber stricture was disrupted.                           The exam of the esophagus revealed it to be                            somewhat tortuous and slightly dilated.                           The stomach was normal save moderate hiatal hernia                            and  benign fundic gland polyps.                           The examined duodenum was normal.                           The cardia and gastric fundus were normal on                            retroflexion. Complications:            No immediate complications. Estimated Blood Loss:     Estimated blood loss: none. Impression:               - Benign-appearing esophageal stenosis. Dilated.                           - Normal stomach, save hiatal hernia.                           - Normal examined duodenum.                           - No specimens collected. Recommendation:           - Patient has a contact number available for                            emergencies. The signs and symptoms of potential                            delayed complications were discussed with the                            patient. Return to normal activities tomorrow.                            Written discharge instructions were provided to the                            patient.                            - Post dilation diet. Chew food well                           - Continue present medications, including twice                            daily pantoprazole.                           - [place]. Henrene Pastor, MD 10/04/2022 10:29:04 AM This report has been signed electronically.

## 2022-10-04 NOTE — Progress Notes (Signed)
Sedate, gd SR, tolerated procedure well, VSS, report to RN 

## 2022-10-04 NOTE — Patient Instructions (Addendum)
NPO 1030-1130 Clear liquids 1130-1230 Soft diet until tomorrow Return to normal activities tomorrow. Written discharge instructions were provided to the patient. - Post dilation diet. Chew food well - Continue present medications, including twice daily pantoprazole. - RESUME PLAVIX TOMORROW  YOU HAD AN ENDOSCOPIC PROCEDURE TODAY: Refer to the procedure report and other information in the discharge instructions given to you for any specific questions about what was found during the examination. If this information does not answer your questions, please call [place] office at [phone] to clarify.   YOU SHOULD EXPECT: Some feelings of bloating in the abdomen. Passage of more gas than usual. Walking can help get rid of the air that was put into your GI tract during the procedure and reduce the bloating. If you had a lower endoscopy (such as a colonoscopy or flexible sigmoidoscopy) you may notice spotting of blood in your stool or on the toilet paper. Some abdominal soreness may be present for a day or two, also.  DIET: Your first meal following the procedure should be a light meal and then it is ok to progress to your normal diet. A half-sandwich or bowl of soup is an example of a good first meal. Heavy or fried foods are harder to digest and may make you feel nauseous or bloated. Drink plenty of fluids but you should avoid alcoholic beverages for 24 hours. If you had a esophageal dilation, please see attached instructions for diet.    ACTIVITY: Your care partner should take you home directly after the procedure. You should plan to take it easy, moving slowly for the rest of the day. You can resume normal activity the day after the procedure however YOU SHOULD NOT DRIVE, use power tools, machinery or perform tasks that involve climbing or major physical exertion for 24 hours (because of the sedation medicines used during the test).   SYMPTOMS TO REPORT IMMEDIATELY: A gastroenterologist can be  reached at any hour. Please call [phone]  for any of the following symptoms:  Following upper endoscopy (EGD, EUS, ERCP, esophageal dilation) Vomiting of blood or coffee ground material  New, significant abdominal pain  New, significant chest pain or pain under the shoulder blades  Painful or persistently difficult swallowing  New shortness of breath  Black, tarry-looking or red, bloody stools  FOLLOW UP:  If any biopsies were taken you will be contacted by phone or by letter within the next 1-3 weeks. Call [phone]  if you have not heard about the biopsies in 3 weeks.  Please also call with any specific questions about appointments or follow up tests.

## 2022-10-04 NOTE — Progress Notes (Signed)
Pt's states no medical or surgical changes since previsit or office visit. VS assessed by C.W 

## 2022-10-05 ENCOUNTER — Telehealth: Payer: Self-pay | Admitting: *Deleted

## 2022-10-05 NOTE — Telephone Encounter (Signed)
  Follow up Call-     10/04/2022    9:38 AM 07/23/2020    1:46 PM  Call back number  Post procedure Call Back phone  # [phone]  Permission to leave phone message Yes Yes     Patient questions:   Message left to call us if necessary.

## 2022-10-12 ENCOUNTER — Other Ambulatory Visit: Payer: Self-pay | Admitting: Internal Medicine

## 2022-11-03 ENCOUNTER — Other Ambulatory Visit: Payer: Self-pay | Admitting: Internal Medicine

## 2022-11-03 ENCOUNTER — Telehealth: Payer: Self-pay

## 2022-11-03 MED ORDER — TRAZODONE HCL 50 MG PO TABS: 25.0000 mg | ORAL_TABLET | Freq: Every evening | ORAL | 3 refills | Status: DC | PRN

## 2022-11-03 NOTE — Telephone Encounter (Signed)
We can try trazodone at night - start 1/2 pill - if not effects can take one pill at night  Make sure she is not drinking any caffeine during the day

## 2022-11-03 NOTE — Addendum Note (Signed)
Addended by: Binnie Rail on: 11/03/2022 09:06 PM   Modules accepted: Orders

## 2022-11-03 NOTE — Telephone Encounter (Signed)
Pts husband called and stated the pt is not sleeping and is needing a medication to help her sleep. Pt is showing more agitated due to not sleeping.  Please contact pts husband at [phone] to inform him on if medication was sent in or what he can do to help his wife sleep as she is not sleeping at all.

## 2022-11-04 ENCOUNTER — Telehealth: Payer: Medicare Other

## 2022-11-04 NOTE — Telephone Encounter (Signed)
Spoke with spouse today.

## 2022-11-10 ENCOUNTER — Encounter: Payer: Self-pay | Admitting: Internal Medicine

## 2022-11-10 NOTE — Progress Notes (Signed)
Subjective:    Patient ID: Janet Steele, female    DOB: 1929/01/02, 88 y.o.   MRN: [id number]      HPI Riannon is here for  Chief Complaint  Patient presents with   Cough   She is here today with her son and home health aide.  Bad cough x 1 month - chronic cough-she has had this cough for well over a year if not much longer.  She does have dysphagia that is related to a combination of GERD, esophageal stricture and dysmotility.  She does had an EGD and saw GI recently.  Her GERD is controlled.  Her cough is productive of white mucus.  She denies cold symptoms.  She coughs day and night, but it seems to be worse when she lays down.  They describe the cough as a choking like cough.  She has tried several cough suppressants and nothing has helped.  She has a chronic runny nose and states she uses a lot of Kleenex.  She denies shortness of breath, wheezing or chest tightness.    Medications and allergies reviewed with patient and updated if appropriate.  Current Outpatient Medications on File Prior to Visit  Medication Sig Dispense Refill   aspirin EC 81 MG tablet Take 81 mg by mouth daily.     atorvastatin (LIPITOR) 80 MG tablet TAKE 1 TABLET BY MOUTH EVERY DAY AT BEDTIME 90 tablet 0   clopidogrel (PLAVIX) 75 MG tablet TAKE 1 TABLET(75 MG) BY MOUTH DAILY. FOLLOW-UP 90 tablet 0   donepezil (ARICEPT) 5 MG tablet Take 1 tablet (5 mg total) by mouth at bedtime. 30 tablet 5   DULoxetine (CYMBALTA) 60 MG capsule TAKE 1 CAPSULE(60 MG) BY MOUTH DAILY 90 capsule 1   glimepiride (AMARYL) 1 MG tablet TAKE 1 TABLET BY MOUTH EVERY MORNING 103 tablet 0   levothyroxine (SYNTHROID) 112 MCG tablet Take 1 tablet (112 mcg total) by mouth daily. 90 tablet 3   metoprolol tartrate (LOPRESSOR) 25 MG tablet TAKE 1 TABLET(25 MG) BY MOUTH TWICE DAILY 180 tablet 1   nitroGLYCERIN (NITROSTAT) 0.4 MG SL tablet Place 1 tablet (0.4 mg total) under the tongue every 5 (five) minutes as needed. For chest pain  25 tablet 5   pantoprazole (PROTONIX) 40 MG tablet Take 1 tablet (40 mg total) by mouth 2 (two) times daily. TAKE 1 TABLET(40 MG) BY MOUTH DAILY 60 tablet 5   No current facility-administered medications on file prior to visit.    Review of Systems  Constitutional:  Negative for fever.  HENT:  Negative for congestion, ear pain, sinus pressure, sinus pain and sore throat.   Respiratory:  Positive for cough. Negative for chest tightness, shortness of breath and wheezing.   Cardiovascular:  Negative for chest pain.  Gastrointestinal:        No gerd  Neurological:  Negative for headaches.       Objective:   Vitals:   11/12/22 1100  BP: 108/66  Pulse: 60  Resp: 16  Temp: 97.9 F (36.6 C)   BP Readings from Last 3 Encounters:  11/12/22 108/66  10/04/22 (!) 146/56  09/07/22 120/60   Wt Readings from Last 3 Encounters:  10/04/22 100 lb (45.4 kg)  09/07/22 100 lb (45.4 kg)  07/07/22 102 lb (46.3 kg)   Body mass index is 19.53 kg/m.    Physical Exam Constitutional:      General: She is not in acute distress.    Appearance: Normal appearance.  HENT:     Head: Normocephalic and atraumatic.  Eyes:     Conjunctiva/sclera: Conjunctivae normal.  Cardiovascular:     Rate and Rhythm: Normal rate and regular rhythm.     Heart sounds: Normal heart sounds. No murmur heard. Pulmonary:     Effort: Pulmonary effort is normal. No respiratory distress.     Breath sounds: Normal breath sounds. No wheezing.  Musculoskeletal:     Cervical back: Neck supple.     Right lower leg: No edema.     Left lower leg: No edema.  Lymphadenopathy:     Cervical: No cervical adenopathy.  Skin:    General: Skin is warm and dry.     Findings: No rash.  Neurological:     Mental Status: She is alert. Mental status is at baseline.  Psychiatric:        Mood and Affect: Mood normal.        Behavior: Behavior normal.            Assessment & Plan:    See Problem List for Assessment and  Plan of chronic medical problems.

## 2022-11-12 ENCOUNTER — Ambulatory Visit (INDEPENDENT_AMBULATORY_CARE_PROVIDER_SITE_OTHER): Payer: Medicare Other | Admitting: Internal Medicine

## 2022-11-12 VITALS — BP 108/66 | HR 60 | Temp 97.9°F | Resp 16 | Ht 60.0 in

## 2022-11-12 DIAGNOSIS — R053 Chronic cough: Secondary | ICD-10-CM

## 2022-11-12 DIAGNOSIS — I200 Unstable angina: Secondary | ICD-10-CM

## 2022-11-12 MED ORDER — FLUTICASONE-SALMETEROL 250-50 MCG/ACT IN AEPB: 1.0000 | INHALATION_SPRAY | Freq: Two times a day (BID) | RESPIRATORY_TRACT | 5 refills | Status: DC

## 2022-11-12 NOTE — Assessment & Plan Note (Signed)
Chronic Going on for at least a couple of years-probably longer It is productive of white phlegm No other cold symptoms-just had a chronic runny nose, so unlikely an infection Has dysphagia and esophageal dysmotility which are likely contributing to the cough Her GERD is controlled no evidence of GERD on recent EGD ?  Allergies, chronic bronchitis, neurogenic component Chest x-ray a year ago when she was having the cough was normal so we will hold off on repeating that now since lungs are clear Trial of Advair 250-50 one puff twice daily-advised to rinse mouth afterwards Start Claritin 10 mg daily Deferred pulmonary referral at this time, but if no improvement I would recommend she see a pulmonologist to see if we can figure out the cough

## 2022-11-12 NOTE — Patient Instructions (Addendum)
       Medications changes include :   advair inhaler twice a day.  Try claritin daily.     If the cough does not improve lets consider a referral to pulmonary    Return if symptoms worsen or fail to improve.

## 2022-12-02 ENCOUNTER — Ambulatory Visit (INDEPENDENT_AMBULATORY_CARE_PROVIDER_SITE_OTHER): Payer: Medicare Other | Admitting: Internal Medicine

## 2022-12-02 ENCOUNTER — Encounter: Payer: Self-pay | Admitting: Internal Medicine

## 2022-12-02 VITALS — BP 124/78 | HR 73 | Temp 98.3°F | Ht 60.0 in

## 2022-12-02 DIAGNOSIS — I200 Unstable angina: Secondary | ICD-10-CM | POA: Diagnosis not present

## 2022-12-02 DIAGNOSIS — K649 Unspecified hemorrhoids: Secondary | ICD-10-CM | POA: Diagnosis not present

## 2022-12-02 NOTE — Progress Notes (Signed)
Subjective:  Patient ID: Janet Steele, female    DOB: September 11, 1929  Age: 88 y.o. MRN: [id number]  CC: Mass (On rectum in between butt cheeks , had drainage couple days ago looked like blood )   HPI Janet Steele presents for feeling a slightly painful growth on the rectum - it bled once the other day and has gotten much better.  No pain or swelling now.  No blood in the stool.  Outpatient Medications Prior to Visit  Medication Sig Dispense Refill   aspirin EC 81 MG tablet Take 81 mg by mouth daily.     atorvastatin (LIPITOR) 80 MG tablet TAKE 1 TABLET BY MOUTH EVERY DAY AT BEDTIME 90 tablet 0   clopidogrel (PLAVIX) 75 MG tablet TAKE 1 TABLET(75 MG) BY MOUTH DAILY. FOLLOW-UP 90 tablet 0   donepezil (ARICEPT) 5 MG tablet Take 1 tablet (5 mg total) by mouth at bedtime. 30 tablet 5   DULoxetine (CYMBALTA) 60 MG capsule TAKE 1 CAPSULE(60 MG) BY MOUTH DAILY 90 capsule 1   fluticasone-salmeterol (ADVAIR DISKUS) 250-50 MCG/ACT AEPB Inhale 1 puff into the lungs in the morning and at bedtime. 60 each 5   glimepiride (AMARYL) 1 MG tablet TAKE 1 TABLET BY MOUTH EVERY MORNING 103 tablet 0   levothyroxine (SYNTHROID) 112 MCG tablet Take 1 tablet (112 mcg total) by mouth daily. 90 tablet 3   metoprolol tartrate (LOPRESSOR) 25 MG tablet TAKE 1 TABLET(25 MG) BY MOUTH TWICE DAILY 180 tablet 1   nitroGLYCERIN (NITROSTAT) 0.4 MG SL tablet Place 1 tablet (0.4 mg total) under the tongue every 5 (five) minutes as needed. For chest pain 25 tablet 5   pantoprazole (PROTONIX) 40 MG tablet Take 1 tablet (40 mg total) by mouth 2 (two) times daily. TAKE 1 TABLET(40 MG) BY MOUTH DAILY 60 tablet 5   No facility-administered medications prior to visit.    ROS: Review of Systems  Constitutional:  Negative for fatigue.  Gastrointestinal:  Positive for rectal pain.  Neurological:  Negative for dizziness.  Hematological:  Does not bruise/bleed easily.    Objective:  BP 124/78 (BP Location: Right Arm, Patient  Position: Sitting, Cuff Size: Normal)   Pulse 73   Temp 98.3 F (36.8 C) (Oral)   Ht 5' (1.524 m)   SpO2 96%   BMI 19.53 kg/m   BP Readings from Last 3 Encounters:  12/02/22 124/78  11/12/22 108/66  10/04/22 (!) 146/56    Wt Readings from Last 3 Encounters:  10/04/22 100 lb (45.4 kg)  09/07/22 100 lb (45.4 kg)  07/07/22 102 lb (46.3 kg)    Physical Exam Constitutional:      General: She is not in acute distress.    Appearance: She is well-developed.  HENT:     Head: Normocephalic.     Right Ear: External ear normal.     Left Ear: External ear normal.     Nose: Nose normal.  Eyes:     General:        Right eye: No discharge.        Left eye: No discharge.     Conjunctiva/sclera: Conjunctivae normal.     Pupils: Pupils are equal, round, and reactive to light.  Neck:     Thyroid: No thyromegaly.     Vascular: No JVD.     Trachea: No tracheal deviation.  Cardiovascular:     Rate and Rhythm: Normal rate and regular rhythm.     Heart sounds: Normal heart sounds.  Pulmonary:     Effort: No respiratory distress.     Breath sounds: No stridor. No wheezing.  Abdominal:     General: Bowel sounds are normal. There is no distension.     Palpations: Abdomen is soft. There is no mass.     Tenderness: There is no abdominal tenderness. There is no guarding or rebound.  Musculoskeletal:        General: No tenderness.     Cervical back: Normal range of motion and neck supple. No rigidity.  Lymphadenopathy:     Cervical: No cervical adenopathy.  Skin:    Findings: No erythema or rash.  Neurological:     Cranial Nerves: No cranial nerve deficit.     Motor: No abnormal muscle tone.     Coordination: Coordination abnormal.     Gait: Gait abnormal.     Deep Tendon Reflexes: Reflexes normal.  Psychiatric:        Behavior: Behavior normal.        Thought Content: Thought content normal.        Judgment: Judgment normal.    Hard hearing In a w/c No stool in the  rectum Digital rectal exam is normal.  Procedure: Anoscopy  Indication: rectal symptoms  Risks and benefits were explained to the  patient in detail. He was placed in lateral decubitus position. Anus exam revealed skin tags. Digital rectal exam was normal,it was  not painful. Stool was guaiac negative. Anoscope was introduced without difficulty. Upon withdrawal erythematous mucosa was observed.  No masses found. Skin tags perianally. There was a "newer"  tag  at 9 o'clock in the anal canal 3 mm.  Impression: No active lesions.  The patient probably had a thrombosed external hemorrhoid that ruptured.  Tolerated well. Complications - none.   Lab Results  Component Value Date   WBC 7.5 07/27/2022   HGB 11.8 (L) 07/27/2022   HCT 35.3 (L) 07/27/2022   PLT 258.0 07/27/2022   GLUCOSE 156 (H) 07/27/2022   CHOL 143 07/27/2022   TRIG 142.0 07/27/2022   HDL 35.30 (L) 07/27/2022   LDLDIRECT 254.0 11/01/2017   LDLCALC 79 07/27/2022   ALT 14 07/27/2022   AST 17 07/27/2022   NA 136 07/27/2022   K 4.3 07/27/2022   CL 104 07/27/2022   CREATININE 1.28 (H) 07/27/2022   BUN 29 (H) 07/27/2022   CO2 24 07/27/2022   TSH 0.06 (L) 07/27/2022   INR 0.99 04/04/2018   HGBA1C 6.7 (H) 07/27/2022   MICROALBUR 3.6 (H) 05/28/2010    DG SWALLOW FUNC OP MEDICARE SPEECH PATH  Result Date: 02/04/2022 Table formatting from the original result was not included. Objective Swallowing Evaluation: Type of Study: MBS-Modified Barium Swallow Study  Patient Details Name: Janet Steele MRN: [id number] Date of Birth: 02-13-1929 Today's Date: 02/04/2022 Time: SLP Start Time (ACUTE ONLY): 2947 -SLP Stop Time (ACUTE ONLY): 1340 SLP Time Calculation (min) (ACUTE ONLY): 19 min Past Medical History: Past Medical History: Diagnosis Date  Anemia   Anxiety   Arthritis   "fingers" (05/19/2016  CAD (coronary artery disease)   a. s/p CABG 1982 b. redo CABG 2003. c. [place] despite med rx 05/2016: successful but complicated PCI of PDA beyond  graft site, c/b localized dissection requiring overlapping stent.  Carotid bruit   a. Duplex 01/2015: patent vessels, 1-39% BICA, f/u PRN recommened.  Colon polyp   adenomatous  Colon, diverticulosis   Degenerative joint disease   Diastolic dysfunction   Esophageal stricture   GERD (  gastroesophageal reflux disease)   Hiatal hernia   History of blood transfusion   "related to my bypass"  Hyperlipidemia   Hypertension   Hypothyroidism   Ischemic cardiomyopathy   a. Cath 05/2016: EF 40% with severe inferior wall HK, suspect hibernating myocardium.  Myocardial infarction ([place]) 1977; 1982  Osteoarthrosis, unspecified whether generalized or localized, unspecified site   Presence of permanent cardiac pacemaker   PVD (peripheral vascular disease) ([place])   Stroke ([place])   greater than 2 years ago per husband  Type II diabetes mellitus ([place])   Unspecified hearing loss  Past Surgical History: Past Surgical History: Procedure Laterality Date  ABDOMINAL HYSTERECTOMY    APPENDECTOMY    BACK SURGERY    CARDIAC CATHETERIZATION N/A 05/19/2016  Procedure: Left Heart Cath and Cors/Grafts Angiography;  Surgeon: Belva Crome, MD;  Location: [facility];  Service: Cardiovascular;  Laterality: N/A;  CARDIAC CATHETERIZATION N/A 05/19/2016  Procedure: Coronary Stent Intervention;  Surgeon: Belva Crome, MD;  Location: [facility];  Service: Cardiovascular;  Laterality: N/A;  CATARACT EXTRACTION W/ INTRAOCULAR LENS  IMPLANT, BILATERAL Bilateral   CHOLECYSTECTOMY OPEN    COLONOSCOPY    CORONARY ANGIOPLASTY WITH STENT PLACEMENT  05/19/2016  "2 stents?"  CORONARY ARTERY BYPASS GRAFT  1982; 2003  x4(1982),02-2002 CABG X2  ESOPHAGOGASTRODUODENOSCOPY (EGD) WITH ESOPHAGEAL DILATION  "2-3 times"  INSERT / REPLACE / REMOVE PACEMAKER    LEFT HEART CATHETERIZATION WITH CORONARY/GRAFT ANGIOGRAM N/A 03/05/2014  Procedure: LEFT HEART CATHETERIZATION WITH Beatrix Fetters;  Surgeon: Sinclair Grooms, MD;  Location: [facility];  Service:  Cardiovascular;  Laterality: N/A;  LUMBAR DISC SURGERY  01-2000  OOPHORECTOMY Bilateral   PERMANENT PACEMAKER INSERTION N/A 07/06/2012  MDT Adapta L implanted by Dr Rayann Heman for symptomatic bradycardia  TONSILLECTOMY  1930s  UPPER GASTROINTESTINAL ENDOSCOPY   HPI: Pt is a 88 yo female referred for OP MBS due to pt report of ongoing dysphagia.  She has h/o COPD, GERD, hiatal hernia, esophageal stricture, DJD, falls - Last EGD 07/2020.  MBS 11/04/2017 negative.  Pt has reported issues with swallowing for years - coughing with foods - asking if due to EGD in 2018.  Esophagram 01/2022 showed coughing with water after pill - concerning for penetration or aspiration and esoph dysmotilty, possible narrowing at distal esophagus.  Subjective: pt referred by Dr Henrene Pastor for OP MBS  Recommendations for follow up therapy are one component of a multi-disciplinary discharge planning process, led by the attending physician.  Recommendations may be updated based on patient status, additional functional criteria and insurance authorization. Assessment / Plan / Recommendation Clinical Impressions 02/04/2022 Clinical Impression Patient presents with difficulty swallowing pill with liquids.  Oral manipulation difficulty resulted in thin liquids spilling into open airway from oral cavity with fourth attempt to take liquids to swallow barium tablet.  Trace aspiration just below the vocal cords was not sensed.  Otherwise patient presents with functional oropharyngeal swallow.  Timely and strong without retention.  Advised pt take her medications with puree - start and follow with liquids.  Using teach back, pt able to articulate recommendations and agreeable to plan - [name] *caregiver and nurse* present and SLP reviewed precaution and recommendations with her.  No SLP follow up needed. Thanks. SLP Visit Diagnosis Dysphagia, oral phase (R13.11) Attention and concentration deficit following -- Frontal lobe and executive function deficit following --  Impact on safety and function Mild aspiration risk   Treatment Recommendations 11/06/2017 Treatment Recommendations Therapy as outlined in  treatment plan below   Prognosis 02/04/2022 Prognosis for Safe Diet Advancement Guarded Barriers to Reach Goals -- Barriers/Prognosis Comment -- Diet Recommendations 02/04/2022 SLP Diet Recommendations Regular solids;Thin liquid Liquid Administration via Cup;Straw Medication Administration Whole meds with puree Compensations Minimize environmental distractions;Slow rate;Small sips/bites Postural Changes Remain semi-upright after after feeds/meals (Comment);Seated upright at 90 degrees   Other Recommendations 11/06/2017 Recommended Consults -- Oral Care Recommendations Oral care BID Other Recommendations -- Follow Up Recommendations -- Assistance recommended at discharge -- Functional Status Assessment -- Frequency and Duration  11/07/2017 Speech Therapy Frequency (ACUTE ONLY) min 2x/week Treatment Duration --   Oral Phase 02/04/2022 Oral Phase Impaired Oral - Pudding Teaspoon -- Oral - Pudding Cup -- Oral - Honey Teaspoon -- Oral - Honey Cup -- Oral - Nectar Teaspoon -- Oral - Nectar Cup WFL Oral - Nectar Straw -- Oral - Thin Teaspoon -- Oral - Thin Cup WFL Oral - Thin Straw WFL Oral - Puree WFL Oral - Mech Soft WFL Oral - Regular -- Oral - Multi-Consistency -- Oral - Pill Decreased bolus cohesion;Premature spillage Oral Phase - Comment Pt with premature spillage of thin liquids into pharynx with attempts to swallow barium tablet resulting in trace silent Aspiration x1 - on 4th swallow attempt.  Aspiration was trace and just below vocal cords without clearance. Otherwise no deficits noted.  Pharyngeal Phase 02/04/2022 Pharyngeal Phase WFL Pharyngeal- Pudding Teaspoon -- Pharyngeal -- Pharyngeal- Pudding Cup -- Pharyngeal -- Pharyngeal- Honey Teaspoon -- Pharyngeal -- Pharyngeal- Honey Cup -- Pharyngeal -- Pharyngeal- Nectar Teaspoon -- Pharyngeal -- Pharyngeal- Nectar Cup -- Pharyngeal --  Pharyngeal- Nectar Straw -- Pharyngeal -- Pharyngeal- Thin Teaspoon -- Pharyngeal -- Pharyngeal- Thin Cup -- Pharyngeal -- Pharyngeal- Thin Straw -- Pharyngeal -- Pharyngeal- Puree -- Pharyngeal -- Pharyngeal- Mechanical Soft -- Pharyngeal -- Pharyngeal- Regular -- Pharyngeal -- Pharyngeal- Multi-consistency -- Pharyngeal -- Pharyngeal- Pill -- Pharyngeal -- Pharyngeal Comment --  Cervical Esophageal Phase  02/04/2022 Cervical Esophageal Phase [facility]- See radiologist note re: esophageal phase of swallow please. Thanks.  Pudding Teaspoon -- Pudding Cup -- Honey Teaspoon -- Honey Cup -- Nectar Teaspoon -- Nectar Cup -- Nectar Straw -- Thin Teaspoon -- Thin Cup -- Thin Straw -- Puree -- Mechanical Soft -- Regular -- Multi-consistency -- Pill -- Cervical Esophageal Comment -- Kathleen Lime, MS [facility] SLP Acute Rehab Services Office [phone] Pager [phone] [name] 02/04/2022, 4:35 PM            CLINICAL DATA:  Dysphagia. EXAM: MODIFIED BARIUM SWALLOW TECHNIQUE: Different consistencies of barium were administered orally to the patient by the Speech Pathologist. Imaging of the pharynx was performed in the lateral projection. Fluoroscopy performed by Rushie Nyhan, NP. Supervision: Van Clines, M.D. FLUOROSCOPY: Fluoroscopy Time:  2 minutes, 0 seconds Radiation Exposure Index (if provided by the fluoroscopic device): 6.0 mGy Number of Acquired Spot Images: 0 COMPARISON:  None. FINDINGS: Thin liquid: Unremarkable Nectar: Unremarkable Puree: Unremarkable aside from mild vallecular residue Solid: Unremarkable Barium pill: The patient had some difficulty with oral control swallowing the pill, and with repeated swallows of thin liquid had an episode of tracheal aspiration (image 200, series 22. The pill demonstrated transient stasis above what appears to be a small type 1 hiatal hernia. IMPRESSION: 1. Single episode of tracheal aspiration while performing thin liquid swallows trying to swallow a 13 mm barium  tablet. Other thin liquid swallows appeared normal. 2. At least transient stasis of the tablet in the distal esophagus above what appears to be a small  type 1 hiatal hernia. Please refer to the Speech Pathologists report for complete details and recommendations. Electronically Signed   By: Van Clines M.D.   On: 02/04/2022 13:57    Assessment & Plan:   Problem List Items Addressed This Visit     Hemorrhoid - Primary    Ms Sheehy probably had a thrombosed external hemorrhoid that ruptured.  Normal anoscopy exam. Use a stool softener RTC 1 mo         No orders of the defined types were placed in this encounter.     Follow-up: Return in about 4 weeks (around 12/30/2022) for a follow-up visit.  Walker Kehr, MD

## 2022-12-02 NOTE — Patient Instructions (Addendum)
Janet Steele probably had a thrombosed external hemorrhoid that ruptured.  Normal anoscopy exam. Use a stool softener

## 2022-12-02 NOTE — Assessment & Plan Note (Addendum)
Janet Steele probably had a thrombosed external hemorrhoid that ruptured.  Normal anoscopy exam. Use a stool softener RTC 1 mo

## 2022-12-20 ENCOUNTER — Other Ambulatory Visit: Payer: Self-pay | Admitting: Internal Medicine

## 2022-12-20 ENCOUNTER — Other Ambulatory Visit: Payer: Self-pay

## 2022-12-20 ENCOUNTER — Encounter: Payer: Self-pay | Admitting: Internal Medicine

## 2022-12-20 NOTE — Progress Notes (Unsigned)
    Subjective:    Patient ID: Janet Steele, female    DOB: 1929-10-09, 89 y.o.   MRN: [id number]      HPI Aarionna is here for No chief complaint on file.   She has h/o CABG '92 and redo CAB '03, stent placement '17.     Chest discomfort x > 2 weeks -     Medications and allergies reviewed with patient and updated if appropriate.  Current Outpatient Medications on File Prior to Visit  Medication Sig Dispense Refill   aspirin EC 81 MG tablet Take 81 mg by mouth daily.     atorvastatin (LIPITOR) 80 MG tablet TAKE 1 TABLET BY MOUTH EVERY DAY AT BEDTIME 90 tablet 0   clopidogrel (PLAVIX) 75 MG tablet TAKE 1 TABLET(75 MG) BY MOUTH DAILY. FOLLOW-UP 90 tablet 0   donepezil (ARICEPT) 5 MG tablet TAKE 1 TABLET(5 MG) BY MOUTH AT BEDTIME 30 tablet 5   DULoxetine (CYMBALTA) 60 MG capsule TAKE 1 CAPSULE(60 MG) BY MOUTH DAILY 90 capsule 1   fluticasone-salmeterol (ADVAIR DISKUS) 250-50 MCG/ACT AEPB Inhale 1 puff into the lungs in the morning and at bedtime. 60 each 5   glimepiride (AMARYL) 1 MG tablet TAKE 1 TABLET BY MOUTH EVERY MORNING 103 tablet 0   levothyroxine (SYNTHROID) 112 MCG tablet Take 1 tablet (112 mcg total) by mouth daily. 90 tablet 3   metoprolol tartrate (LOPRESSOR) 25 MG tablet TAKE 1 TABLET(25 MG) BY MOUTH TWICE DAILY 180 tablet 1   nitroGLYCERIN (NITROSTAT) 0.4 MG SL tablet Place 1 tablet (0.4 mg total) under the tongue every 5 (five) minutes as needed. For chest pain 25 tablet 5   pantoprazole (PROTONIX) 40 MG tablet Take 1 tablet (40 mg total) by mouth 2 (two) times daily. TAKE 1 TABLET(40 MG) BY MOUTH DAILY 60 tablet 5   No current facility-administered medications on file prior to visit.    Review of Systems     Objective:  There were no vitals filed for this visit. BP Readings from Last 3 Encounters:  12/02/22 124/78  11/12/22 108/66  10/04/22 (!) 146/56   Wt Readings from Last 3 Encounters:  10/04/22 100 lb (45.4 kg)  09/07/22 100 lb (45.4 kg)   07/07/22 102 lb (46.3 kg)   There is no height or weight on file to calculate BMI.    Physical Exam         Assessment & Plan:    See Problem List for Assessment and Plan of chronic medical problems.

## 2022-12-21 ENCOUNTER — Encounter: Payer: Self-pay | Admitting: Internal Medicine

## 2022-12-21 ENCOUNTER — Ambulatory Visit (INDEPENDENT_AMBULATORY_CARE_PROVIDER_SITE_OTHER): Payer: Medicare Other | Admitting: Internal Medicine

## 2022-12-21 VITALS — BP 124/62 | HR 64 | Temp 98.3°F | Ht 60.0 in

## 2022-12-21 DIAGNOSIS — I10 Essential (primary) hypertension: Secondary | ICD-10-CM

## 2022-12-21 DIAGNOSIS — R051 Acute cough: Secondary | ICD-10-CM | POA: Diagnosis not present

## 2022-12-21 DIAGNOSIS — G479 Sleep disorder, unspecified: Secondary | ICD-10-CM | POA: Diagnosis not present

## 2022-12-21 MED ORDER — HYDROCODONE BIT-HOMATROP MBR 5-1.5 MG/5ML PO SOLN: 5.0000 mL | Freq: Three times a day (TID) | ORAL | 0 refills | Status: DC | PRN

## 2022-12-21 MED ORDER — BENZONATATE 100 MG PO CAPS: 100.0000 mg | ORAL_CAPSULE | Freq: Three times a day (TID) | ORAL | 0 refills | Status: DC | PRN

## 2022-12-21 NOTE — Patient Instructions (Addendum)
Medications changes include :    tessalon perles for cough three times a day as needed ( does not cause drowsiness)  Hycodan cough syrup for night ( will cause drowsiness)   You can try an anti-histamine such as claritin at night for "sleep"   Return if symptoms worsen or fail to improve.

## 2022-12-21 NOTE — Assessment & Plan Note (Signed)
Acute Likely related to viral URI Has a chronic cough with dysphagia - this is improved per son Cough is worse at night - no evidence of infection so need for an abx Tessalon perles tid prn Hycodan cough syrup at night Family will let me know if symptoms do not improve Can start claritin

## 2022-12-21 NOTE — Assessment & Plan Note (Signed)
Chronic BP controlled Continue metoprolol 25 mg bid

## 2022-12-21 NOTE — Assessment & Plan Note (Signed)
Chronic Sleeps a lot during the day  Sleeps more than she realizes at night Trazodone cause incontinence Given memory issues do not want to worsen that Can give her claritin at night - may feel like she is getting something to help her sleep

## 2022-12-29 ENCOUNTER — Other Ambulatory Visit: Payer: Self-pay | Admitting: Internal Medicine

## 2022-12-29 ENCOUNTER — Telehealth: Payer: Self-pay | Admitting: Internal Medicine

## 2022-12-29 DIAGNOSIS — I25700 Atherosclerosis of coronary artery bypass graft(s), unspecified, with unstable angina pectoris: Secondary | ICD-10-CM

## 2022-12-29 DIAGNOSIS — I200 Unstable angina: Secondary | ICD-10-CM

## 2022-12-29 NOTE — Telephone Encounter (Signed)
Patients husband called and said patient is wanting to start on vitamens but wanted to lnow what Dr Quay Burow would recommend for OTC. Call back is [phone]

## 2022-12-29 NOTE — Telephone Encounter (Signed)
Spoke with spouse today.

## 2023-01-04 ENCOUNTER — Ambulatory Visit: Payer: Medicare Other | Admitting: Internal Medicine

## 2023-01-10 ENCOUNTER — Telehealth: Payer: Self-pay | Admitting: Internal Medicine

## 2023-01-10 ENCOUNTER — Other Ambulatory Visit: Payer: Self-pay | Admitting: Internal Medicine

## 2023-01-10 MED ORDER — BENZONATATE 100 MG PO CAPS: ORAL_CAPSULE | ORAL | 0 refills | Status: DC

## 2023-01-10 NOTE — Telephone Encounter (Signed)
Refill has already been sent in today.

## 2023-01-10 NOTE — Telephone Encounter (Signed)
MEDICATION:benzonatate (TESSALON PERLES) 100 MG capsule   PHARMACY:[facility] [phone] - [place], [place] - [place]   Comments:   **Let patient know to contact pharmacy at the end of the day to make sure medication is ready. **  ** Please notify patient to allow 48-72 hours to process**  **Encourage patient to contact the pharmacy for refills or they can request refills through [facility]**

## 2023-01-17 ENCOUNTER — Telehealth: Payer: Self-pay | Admitting: Cardiovascular Disease

## 2023-01-17 MED ORDER — NITROGLYCERIN 0.4 MG SL SUBL: 0.4000 mg | SUBLINGUAL_TABLET | SUBLINGUAL | 5 refills | Status: DC | PRN

## 2023-01-17 NOTE — Telephone Encounter (Signed)
Called and spoke with patient who states that using her walker to go to bathroom and back to chair "just wears her out, makes me completely breathless." Patient states that the chest pain is intermittent, but frequent. Both symptoms recur with very minimal activity and subside with rest. Advised her that this is not normal and needs to be addressed sooner rather than later. She again refused appt with anyone other than Dr Burt Knack. Scheduled for 02/02/23. Patient states she has not tried any nitroglycerin. Asked if she had any on hand and she does not. Will send refill in at this time. She agrees to see another provider if this gets worse and she states she will call us if so.

## 2023-01-17 NOTE — Telephone Encounter (Signed)
Patient stated she has been experiencing shortness of breath upon exertion and chest pain for three weeks. Pt. Mentioned she feels these symptoms while using her walker, however, when she sitting still she is asymptomatic. She  has not contact her PCP with her symptoms, denies any other symptoms. Patient stated she didn't want appointment with anyone else,except with Janet Steele. Explained that Janet Steele does not have any openings this week. However, the patient requested to see Janet Steele, will forward to MD and nurse.

## 2023-01-17 NOTE — Telephone Encounter (Signed)
Pt c/o of Chest Pain: STAT if CP now or developed within 24 hours  1. Are you having CP right now? no  2. Are you experiencing any other symptoms (ex. SOB, nausea, vomiting, sweating)? no  3. How long have you been experiencing CP? A week  4. Is your CP continuous or coming and going? Coming and going   5. Have you taken Nitroglycerin? no ?

## 2023-01-28 ENCOUNTER — Other Ambulatory Visit: Payer: Self-pay

## 2023-01-28 ENCOUNTER — Other Ambulatory Visit: Payer: Self-pay | Admitting: Internal Medicine

## 2023-02-02 ENCOUNTER — Encounter: Payer: Self-pay | Admitting: Cardiovascular Disease

## 2023-02-02 ENCOUNTER — Telehealth: Payer: Self-pay

## 2023-02-02 ENCOUNTER — Ambulatory Visit: Payer: Medicare Other | Attending: Cardiovascular Disease | Admitting: Cardiovascular Disease

## 2023-02-02 VITALS — BP 110/80 | HR 78 | Ht 60.0 in | Wt 106.6 lb

## 2023-02-02 DIAGNOSIS — I5032 Chronic diastolic (congestive) heart failure: Secondary | ICD-10-CM | POA: Insufficient documentation

## 2023-02-02 DIAGNOSIS — I200 Unstable angina: Secondary | ICD-10-CM | POA: Diagnosis not present

## 2023-02-02 DIAGNOSIS — Z950 Presence of cardiac pacemaker: Secondary | ICD-10-CM | POA: Diagnosis not present

## 2023-02-02 DIAGNOSIS — I25119 Atherosclerotic heart disease of native coronary artery with unspecified angina pectoris: Secondary | ICD-10-CM | POA: Insufficient documentation

## 2023-02-02 DIAGNOSIS — E782 Mixed hyperlipidemia: Secondary | ICD-10-CM

## 2023-02-02 LAB — CUP PACEART INCLINIC DEVICE CHECK
Battery Impedance: 1724 Ohm
Battery Remaining Longevity: 41 mo
Battery Voltage: 2.75 V
Brady Statistic AP VP Percent: 1 %
Brady Statistic AP VS Percent: 95 %
Brady Statistic AS VP Percent: 0 %
Brady Statistic AS VS Percent: 3 %
Date Time Interrogation Session: 20240228114552
Implantable Lead Connection Status: 753985
Implantable Lead Connection Status: 753985
Implantable Lead Implant Date: 20130801
Implantable Lead Implant Date: 20130801
Implantable Lead Location: 753859
Implantable Lead Location: 753860
Implantable Lead Model: 5076
Implantable Lead Model: 5092
Implantable Pulse Generator Implant Date: 20130801
Lead Channel Impedance Value: 571 Ohm
Lead Channel Impedance Value: 575 Ohm
Lead Channel Pacing Threshold Amplitude: 0.75 V
Lead Channel Pacing Threshold Amplitude: 1.125 V
Lead Channel Pacing Threshold Pulse Width: 0.4 ms
Lead Channel Pacing Threshold Pulse Width: 0.4 ms
Lead Channel Sensing Intrinsic Amplitude: 15.67 mV
Lead Channel Setting Pacing Amplitude: 2.25 V
Lead Channel Setting Pacing Amplitude: 2.5 V
Lead Channel Setting Pacing Pulse Width: 0.4 ms
Lead Channel Setting Sensing Sensitivity: 5.6 mV
Zone Setting Status: 755011
Zone Setting Status: 755011

## 2023-02-02 NOTE — Telephone Encounter (Signed)
Monitor ordered 02/02/2023. Pt should receive it in 7-10 business days.

## 2023-02-02 NOTE — Progress Notes (Signed)
Cardiology Office Note:    Date:  02/02/2023   ID:  Janet Steele, DOB [birth year], MRN [id number]  PCP:  Binnie Rail, MD   [place] Providers Cardiologist:  Sherren Mocha, MD     Referring MD: Binnie Rail, MD   Chief Complaint  Patient presents with   Coronary Artery Disease    History of Present Illness:    Janet Steele is a 89 y.o. female with a hx of extensive CAD, presenting for follow-up evaluation. The patient had remote CABG in 1992 and redo CABG in 2003. Comorbid conditions include hypertension, mixed hyperlipidemia, history of type 2 diabetes, and permanent pacemaker implantation for treatment of symptomatic bradycardia.   The patient called in a few weeks ago with shortness of breath, weakness, and chest discomfort.  She states that she would have gone to the emergency department but her symptoms ultimately resolved.  She has been feeling better lately.  She continues to have some shortness of breath associated with physical activity.  No orthopnea, PND, or leg swelling.  No chest pain or pressure over the past few weeks.  She is not sure what caused her symptoms a few weeks back.  Past Medical History:  Diagnosis Date   Anemia    Anxiety    Arthritis    "fingers" (05/19/2016   CAD (coronary artery disease)    a. s/p CABG 1982 b. redo CABG 2003. c. [place] despite med rx 05/2016: successful but complicated PCI of PDA beyond graft site, c/b localized dissection requiring overlapping stent.   Carotid bruit    a. Duplex 01/2015: patent vessels, 1-39% BICA, f/u PRN recommened.   Colon polyp    adenomatous   Colon, diverticulosis    Degenerative joint disease    Diastolic dysfunction    Esophageal stricture    GERD (gastroesophageal reflux disease)    Hiatal hernia    History of blood transfusion    "related to my bypass"   Hyperlipidemia    Hypertension    Hypothyroidism    Ischemic cardiomyopathy    a. Cath 05/2016: EF 40% with severe inferior wall  HK, suspect hibernating myocardium.   Myocardial infarction ([place]) 1977; 1982   Osteoarthrosis, unspecified whether generalized or localized, unspecified site    Presence of permanent cardiac pacemaker    PVD (peripheral vascular disease) ([place])    Stroke ([place])    greater than 2 years ago per husband   Type II diabetes mellitus ([place])    Unspecified hearing loss     Past Surgical History:  Procedure Laterality Date   ABDOMINAL HYSTERECTOMY     APPENDECTOMY     BACK SURGERY     CARDIAC CATHETERIZATION N/A 05/19/2016   Procedure: Left Heart Cath and Cors/Grafts Angiography;  Surgeon: Belva Crome, MD;  Location: [facility];  Service: Cardiovascular;  Laterality: N/A;   CARDIAC CATHETERIZATION N/A 05/19/2016   Procedure: Coronary Stent Intervention;  Surgeon: Belva Crome, MD;  Location: [facility];  Service: Cardiovascular;  Laterality: N/A;   CATARACT EXTRACTION W/ INTRAOCULAR LENS  IMPLANT, BILATERAL Bilateral    CHOLECYSTECTOMY OPEN     COLONOSCOPY     CORONARY ANGIOPLASTY WITH STENT PLACEMENT  05/19/2016   "2 stents?"   CORONARY ARTERY BYPASS GRAFT  1982; 2003   x4(1982),02-2002 CABG X2   ESOPHAGOGASTRODUODENOSCOPY (EGD) WITH ESOPHAGEAL DILATION  "2-3 times"   INSERT / REPLACE / REMOVE PACEMAKER     LEFT HEART CATHETERIZATION WITH  CORONARY/GRAFT ANGIOGRAM N/A 03/05/2014   Procedure: LEFT HEART CATHETERIZATION WITH Beatrix Fetters;  Surgeon: Sinclair Grooms, MD;  Location: [facility];  Service: Cardiovascular;  Laterality: N/A;   LUMBAR DISC SURGERY  01-2000   OOPHORECTOMY Bilateral    PERMANENT PACEMAKER INSERTION N/A 07/06/2012   MDT Adapta L implanted by Dr Rayann Heman for symptomatic bradycardia   TONSILLECTOMY  1930s   UPPER GASTROINTESTINAL ENDOSCOPY      Current Medications: Current Meds  Medication Sig   aspirin EC 81 MG tablet Take 81 mg by mouth daily.   atorvastatin (LIPITOR) 80 MG tablet TAKE 1 TABLET BY MOUTH EVERY DAY AT BEDTIME   benzonatate  (TESSALON) 100 MG capsule TAKE 1 CAPSULE(100 MG) BY MOUTH THREE TIMES DAILY AS NEEDED   clopidogrel (PLAVIX) 75 MG tablet TAKE 1 TABLET(75 MG) BY MOUTH DAILY. FOLLOW-UP   donepezil (ARICEPT) 5 MG tablet TAKE 1 TABLET(5 MG) BY MOUTH AT BEDTIME   DULoxetine (CYMBALTA) 60 MG capsule TAKE 1 CAPSULE(60 MG) BY MOUTH DAILY   fluticasone-salmeterol (ADVAIR DISKUS) 250-50 MCG/ACT AEPB Inhale 1 puff into the lungs in the morning and at bedtime.   glimepiride (AMARYL) 1 MG tablet TAKE 1 TABLET BY MOUTH EVERY MORNING   HYDROcodone bit-homatropine (HYCODAN) 5-1.5 MG/5ML syrup Take 5 mLs by mouth every 8 (eight) hours as needed for cough.   levothyroxine (SYNTHROID) 112 MCG tablet Take 1 tablet (112 mcg total) by mouth daily.   metoprolol tartrate (LOPRESSOR) 25 MG tablet TAKE 1 TABLET(25 MG) BY MOUTH TWICE DAILY   nitroGLYCERIN (NITROSTAT) 0.4 MG SL tablet Place 1 tablet (0.4 mg total) under the tongue every 5 (five) minutes as needed. For chest pain   pantoprazole (PROTONIX) 40 MG tablet Take 1 tablet (40 mg total) by mouth 2 (two) times daily. TAKE 1 TABLET(40 MG) BY MOUTH DAILY     Allergies:   Morphine, Penicillins, Prednisone, Sulfonamide derivatives, Codeine, Hydrocodone-acetaminophen, Meperidine hcl, Metoclopramide hcl, Metoprolol succinate, Moxifloxacin, Oxycodone-aspirin, Pentazocine lactate, Pioglitazone, and Trazodone and nefazodone   Social History   Socioeconomic History   Marital status: Widowed    Spouse name: [name]   Number of children: 3   Years of education: 16   Highest education level: Not on file  Occupational History   Occupation: Retired     Fish farm manager: RETIRED  Tobacco Use   Smoking status: Former    Packs/day: 0.50    Years: 30.00    Total pack years: 15.00    Types: Cigarettes    Quit date: 07/05/1976    Years since quitting: 46.6   Smokeless tobacco: Never  Vaping Use   Vaping Use: Never used  Substance and Sexual Activity   Alcohol use: No    Alcohol/week: 0.0  standard drinks of alcohol    Comment: "not anymore, I haven't in I don't know years"   Drug use: No   Sexual activity: Never  Other Topics Concern   Not on file  Social History Narrative   Patient is married [name]) and lives at home with her husband.   Patient has three adult children.   Patient is retired.   Patient has a college education.   Patient is right-handed.   Patient does not drink any caffeine.   Social Determinants of Health   Financial Resource Strain: Low Risk  (06/04/2020)   Overall Financial Resource Strain (CARDIA)    Difficulty of Paying Living Expenses: Not hard at all  Food Insecurity: Unknown (07/13/2022)   Hunger Vital Sign  Worried About Charity fundraiser in the Last Year: Never true    [place] in the Last Year: Not on file  Transportation Needs: No Transportation Needs (07/13/2022)   PRAPARE - Hydrologist (Medical): No    Lack of Transportation (Non-Medical): No  Physical Activity: Unknown (06/04/2020)   Exercise Vital Sign    Days of Exercise per Week: Patient refused    Minutes of Exercise per Session: Not on file  Stress: No Stress Concern Present (06/04/2020)   [place]    Feeling of Stress : Not at all  Social Connections: Unknown (06/04/2020)   Social Connection and Isolation Panel [NHANES]    Frequency of Communication with Friends and Family: More than three times a week    Frequency of Social Gatherings with Friends and Family: More than three times a week    Attends Religious Services: Patient refused    Marine scientist or Organizations: Patient refused    Attends Archivist Meetings: Patient refused    Marital Status: Married     Family History: The patient's family history includes Heart attack in her sister; Heart disease in her sister. There is no history of Colon cancer, Breast cancer, Celiac disease, Cirrhosis,  Clotting disorder, Colitis, Colon polyps, Crohn's disease, Cystic fibrosis, Diabetes, Esophageal cancer, Hemochromatosis, Inflammatory bowel disease, Irritable bowel syndrome, Kidney disease, Liver cancer, Liver disease, Ovarian cancer, Pancreatic cancer, Prostate cancer, Rectal cancer, Stomach cancer, Ulcerative colitis, Uterine cancer, or Wilson's disease.  ROS:   Please see the history of present illness.    All other systems reviewed and are negative.  EKGs/Labs/Other Studies Reviewed:    The following studies were reviewed today: Echo 11/04/2017: - Left ventricle: The cavity size was normal. Wall thickness was    normal. Systolic function was normal. The estimated ejection    fraction was in the range of 60% to 65%. Wall motion was normal;    there were no regional wall motion abnormalities. Doppler    parameters are consistent with abnormal left ventricular    relaxation (grade 1 diastolic dysfunction).  - Aortic valve: Trileaflet; mildly thickened, mildly calcified    leaflets.  - Right ventricle: Pacer wire or catheter noted in right ventricle.   Impressions:   - No cardiac source of emboli was indentified. Compared to the    prior study, there has been no significant interval change.   Cardiac Cath 05/19/2016: Unstable angina due to new high-grade obstruction in the PDA beyond the graft insertion site. The saphenous vein graft itself is widely patent. Severe chest discomfort during the procedure particularly durng saphenous vein graft angiography. Total occlusion of the saphenous vein graft to the diagonal and the saphenous vein graft to the ramus/circumflex. Widely patent LIMA to the LAD. Native coronary territory reveals total occlusion of the RCA, total occlusion of the proximal LAD, and diffuse high-grade obstruction in the circumflex territory. Successful but complicated PCI of the PDA beyond the graft insertion site, complicated by localized dissection beyond the site of  initial stent implantation, requiring a shorter stent to be overlapped at the distal margin. Final result is 99% stenosis reduced to 0% with TIMI grade 3 flow. Severe inferior wall hypokinesis, assumed hibernating. EF 40%.   RECOMMENDATIONS:   Aspirin and Plavix Discharge in a.m. if stable If continued angina, consider LM stent but territory is small. Check renal function in a.m., as 280 cc  of contrast was used during the procedure. Diagnostic Dominance: Right  Intervention    EKG:  EKG is ordered today.  The ekg ordered today demonstrates atrial paced rhythm with prolonged AV conduction and PVCs, heart rate 78 bpm, possible inferior infarct age undetermined.  Recent Labs: 07/27/2022: ALT 14; BUN 29; Creatinine, Ser 1.28; Hemoglobin 11.8; Platelets 258.0; Potassium 4.3; Sodium 136; TSH 0.06  Recent Lipid Panel    Component Value Date/Time   CHOL 143 07/27/2022 1453   TRIG 142.0 07/27/2022 1453   HDL 35.30 (L) 07/27/2022 1453   CHOLHDL 4 07/27/2022 1453   VLDL 28.4 07/27/2022 1453   LDLCALC 79 07/27/2022 1453   LDLDIRECT 254.0 11/01/2017 1702     Risk Assessment/Calculations:                Physical Exam:    VS:  BP 110/80   Pulse 78   Ht 5' (1.524 m)   Wt 106 lb 9.6 oz (48.4 kg)   BMI 20.82 kg/m     Wt Readings from Last 3 Encounters:  02/02/23 106 lb 9.6 oz (48.4 kg)  10/04/22 100 lb (45.4 kg)  09/07/22 100 lb (45.4 kg)     GEN: Elderly, frail-appearing woman in no distress, in a wheelchair today. HEENT: Normal NECK: No JVD; No carotid bruits LYMPHATICS: No lymphadenopathy CARDIAC: RRR, no murmurs, rubs, gallops RESPIRATORY:  Clear to auscultation without rales, wheezing or rhonchi  ABDOMEN: Soft, non-tender, non-distended MUSCULOSKELETAL:  No edema; No deformity  SKIN: Warm and dry NEUROLOGIC:  Alert and oriented x 3 PSYCHIATRIC:  Normal affect   ASSESSMENT:    1. Coronary artery disease involving native coronary artery of native heart with angina  pectoris ([place])   2. Mixed hyperlipidemia   3. Presence of permanent cardiac pacemaker   4. Chronic diastolic heart failure (HCC)    PLAN:    In order of problems listed above:  The patient appears to be clinically stable.  Difficult to know about her symptoms a few weeks ago, but she does not appear to have any persistent symptoms at this time.  She will remain on DAPT with aspirin and clopidogrel, high intensity statin drug, and a beta-blocker.  No changes are made today in her medical regimen.  With her advanced age, frailty, and extensive history with redo CABG and later with PCI, I think a conservative approach to her care is most appropriate. Treated with atorvastatin.  Lipids with a cholesterol 143, LDL 79, HDL 35.  At her advanced age, will not intensify her lipid-lowering therapy at this time. Reviewed her chart and her device has not been checked in some time.  The device clinic is notified and her pacemaker is interrogated.  Follow-up is arranged. Overall appears clinically stable at this time.  Blood pressure is well-controlled.  No signs of volume overload on exam.  Continue current management with a beta-blocker.  Not requiring diuretic therapy at this time.  Limited by functional class II symptoms of fatigue and shortness of breath.     Medication Adjustments/Labs and Tests Ordered: Current medicines are reviewed at length with the patient today.  Concerns regarding medicines are outlined above.  Orders Placed This Encounter  Procedures   EKG 12-Lead   ECHOCARDIOGRAM COMPLETE   No orders of the defined types were placed in this encounter.   Patient Instructions  Medication Instructions:  Your physician recommends that you continue on your current medications as directed. Please refer to the Current Medication list given to  you today.  *If you need a refill on your cardiac medications before your next appointment, please call your pharmacy*   Lab Work: NONE If you have  labs (blood work) drawn today and your tests are completely normal, you will receive your results only by: [place] (if you have MyChart) OR A paper copy in the mail If you have any lab test that is abnormal or we need to change your treatment, we will call you to review the results.   Testing/Procedures: ECHO Your physician has requested that you have an echocardiogram. Echocardiography is a painless test that uses sound waves to create images of your heart. It provides your doctor with information about the size and shape of your heart and how well your heart's chambers and valves are working. This procedure takes approximately one hour. There are no restrictions for this procedure. Please do NOT wear cologne, perfume, aftershave, or lotions (deodorant is allowed). Please arrive 15 minutes prior to your appointment time.  Follow-Up: At [facility], you and your health needs are our priority.  As part of our continuing mission to provide you with exceptional heart care, we have created designated Provider Care Teams.  These Care Teams include your primary Cardiologist (physician) and Advanced Practice Providers (APPs -  Physician Assistants and Nurse Practitioners) who all work together to provide you with the care you need, when you need it.  Your next appointment:   6 month(s)  Provider:   Richardson Dopp, PA, Christen Bame, NP, or Ambrose Pancoast, NP     Signed, Sherren Mocha, MD  02/02/2023 2:54 PM    [employer]

## 2023-02-02 NOTE — Patient Instructions (Signed)
Medication Instructions:  Your physician recommends that you continue on your current medications as directed. Please refer to the Current Medication list given to you today.  *If you need a refill on your cardiac medications before your next appointment, please call your pharmacy*   Lab Work: NONE If you have labs (blood work) drawn today and your tests are completely normal, you will receive your results only by: [place] (if you have MyChart) OR A paper copy in the mail If you have any lab test that is abnormal or we need to change your treatment, we will call you to review the results.   Testing/Procedures: ECHO Your physician has requested that you have an echocardiogram. Echocardiography is a painless test that uses sound waves to create images of your heart. It provides your doctor with information about the size and shape of your heart and how well your heart's chambers and valves are working. This procedure takes approximately one hour. There are no restrictions for this procedure. Please do NOT wear cologne, perfume, aftershave, or lotions (deodorant is allowed). Please arrive 15 minutes prior to your appointment time.  Follow-Up: At [facility], you and your health needs are our priority.  As part of our continuing mission to provide you with exceptional heart care, we have created designated Provider Care Teams.  These Care Teams include your primary Cardiologist (physician) and Advanced Practice Providers (APPs -  Physician Assistants and Nurse Practitioners) who all work together to provide you with the care you need, when you need it.  Your next appointment:   6 month(s)  Provider:   Richardson Dopp, PA, Christen Bame, NP, or Ambrose Pancoast, NP

## 2023-02-08 ENCOUNTER — Telehealth: Payer: Self-pay | Admitting: Internal Medicine

## 2023-02-08 ENCOUNTER — Ambulatory Visit (INDEPENDENT_AMBULATORY_CARE_PROVIDER_SITE_OTHER): Payer: Medicare Other

## 2023-02-08 DIAGNOSIS — I495 Sick sinus syndrome: Secondary | ICD-10-CM | POA: Diagnosis not present

## 2023-02-08 NOTE — Telephone Encounter (Signed)
Noted  

## 2023-02-08 NOTE — Telephone Encounter (Signed)
Patient's son called and said that even if his father calls in and says that his mom is out of trazadone - it is not to be prescribed for her - it causes stomach discomfort and issues.  Son:  Marice, [place]. - Phone:  [phone]

## 2023-02-09 LAB — CUP PACEART REMOTE DEVICE CHECK
Battery Impedance: 1797 Ohm
Battery Remaining Longevity: 39 mo
Battery Voltage: 2.75 V
Brady Statistic AP VP Percent: 3 %
Brady Statistic AP VS Percent: 97 %
Brady Statistic AS VP Percent: 0 %
Brady Statistic AS VS Percent: 0 %
Date Time Interrogation Session: 20240305170230
Implantable Lead Connection Status: 753985
Implantable Lead Connection Status: 753985
Implantable Lead Implant Date: 20130801
Implantable Lead Implant Date: 20130801
Implantable Lead Location: 753859
Implantable Lead Location: 753860
Implantable Lead Model: 5076
Implantable Lead Model: 5092
Implantable Pulse Generator Implant Date: 20130801
Lead Channel Impedance Value: 540 Ohm
Lead Channel Impedance Value: 586 Ohm
Lead Channel Pacing Threshold Amplitude: 0.875 V
Lead Channel Pacing Threshold Amplitude: 1.125 V
Lead Channel Pacing Threshold Pulse Width: 0.4 ms
Lead Channel Pacing Threshold Pulse Width: 0.4 ms
Lead Channel Setting Pacing Amplitude: 2.25 V
Lead Channel Setting Pacing Amplitude: 2.5 V
Lead Channel Setting Pacing Pulse Width: 0.4 ms
Lead Channel Setting Sensing Sensitivity: 5.6 mV
Zone Setting Status: 755011
Zone Setting Status: 755011

## 2023-02-20 NOTE — Progress Notes (Signed)
Subjective:    Patient ID: Janet Steele, female    DOB: 07-15-1929, 89 y.o.   MRN: [id number]     HPI Imogen is here for follow up of her chronic medical problems.  Her son is with her today.   She is not sleeping well at night - does not nap during the day - although her son states she sleep intermittently all day.    Medications and allergies reviewed with patient and updated if appropriate.  Current Outpatient Medications on File Prior to Visit  Medication Sig Dispense Refill   aspirin EC 81 MG tablet Take 81 mg by mouth daily.     atorvastatin (LIPITOR) 80 MG tablet TAKE 1 TABLET BY MOUTH EVERY DAY AT BEDTIME 90 tablet 0   clopidogrel (PLAVIX) 75 MG tablet TAKE 1 TABLET(75 MG) BY MOUTH DAILY. FOLLOW-UP 90 tablet 0   donepezil (ARICEPT) 5 MG tablet TAKE 1 TABLET(5 MG) BY MOUTH AT BEDTIME 30 tablet 5   DULoxetine (CYMBALTA) 60 MG capsule TAKE 1 CAPSULE(60 MG) BY MOUTH DAILY 90 capsule 1   fluticasone-salmeterol (ADVAIR DISKUS) 250-50 MCG/ACT AEPB Inhale 1 puff into the lungs in the morning and at bedtime. 60 each 5   glimepiride (AMARYL) 1 MG tablet TAKE 1 TABLET BY MOUTH EVERY MORNING 103 tablet 0   levothyroxine (SYNTHROID) 112 MCG tablet Take 1 tablet (112 mcg total) by mouth daily. 90 tablet 3   metoprolol tartrate (LOPRESSOR) 25 MG tablet TAKE 1 TABLET(25 MG) BY MOUTH TWICE DAILY 180 tablet 1   nitroGLYCERIN (NITROSTAT) 0.4 MG SL tablet Place 1 tablet (0.4 mg total) under the tongue every 5 (five) minutes as needed. For chest pain 25 tablet 5   pantoprazole (PROTONIX) 40 MG tablet Take 1 tablet (40 mg total) by mouth 2 (two) times daily. TAKE 1 TABLET(40 MG) BY MOUTH DAILY 60 tablet 5   benzonatate (TESSALON) 100 MG capsule TAKE 1 CAPSULE(100 MG) BY MOUTH THREE TIMES DAILY AS NEEDED (Patient not taking: Reported on 02/21/2023) 90 capsule 0   HYDROcodone bit-homatropine (HYCODAN) 5-1.5 MG/5ML syrup Take 5 mLs by mouth every 8 (eight) hours as needed for cough. (Patient  not taking: Reported on 02/21/2023) 120 mL 0   No current facility-administered medications on file prior to visit.     Review of Systems  Constitutional:  Negative for appetite change.  HENT:  Positive for congestion and rhinorrhea. Negative for trouble swallowing.   Respiratory:  Positive for cough. Negative for shortness of breath and wheezing.   Cardiovascular:  Negative for chest pain, palpitations and leg swelling.  Neurological:  Negative for dizziness and headaches.  Psychiatric/Behavioral:  Positive for sleep disturbance.        Objective:   Vitals:   02/21/23 1318  BP: 108/72  Pulse: 78  Temp: 98 F (36.7 C)  SpO2: 98%   BP Readings from Last 3 Encounters:  02/21/23 108/72  02/02/23 110/80  12/21/22 124/62   Wt Readings from Last 3 Encounters:  02/02/23 106 lb 9.6 oz (48.4 kg)  10/04/22 100 lb (45.4 kg)  09/07/22 100 lb (45.4 kg)   Body mass index is 20.82 kg/m.    Physical Exam Constitutional:      General: She is not in acute distress.    Appearance: Normal appearance.  HENT:     Head: Normocephalic and atraumatic.  Eyes:     Conjunctiva/sclera: Conjunctivae normal.  Cardiovascular:     Rate and Rhythm: Normal rate and regular rhythm.  Heart sounds: Murmur heard.  Pulmonary:     Effort: Pulmonary effort is normal. No respiratory distress.     Breath sounds: Normal breath sounds. No wheezing.  Musculoskeletal:     Cervical back: Neck supple.     Right lower leg: No edema.     Left lower leg: No edema.  Lymphadenopathy:     Cervical: No cervical adenopathy.  Skin:    General: Skin is warm and dry.     Findings: No rash.  Neurological:     Mental Status: She is alert. Mental status is at baseline.  Psychiatric:        Mood and Affect: Mood normal.        Behavior: Behavior normal.        Lab Results  Component Value Date   WBC 7.5 07/27/2022   HGB 11.8 (L) 07/27/2022   HCT 35.3 (L) 07/27/2022   PLT 258.0 07/27/2022   GLUCOSE 156  (H) 07/27/2022   CHOL 143 07/27/2022   TRIG 142.0 07/27/2022   HDL 35.30 (L) 07/27/2022   LDLDIRECT 254.0 11/01/2017   LDLCALC 79 07/27/2022   ALT 14 07/27/2022   AST 17 07/27/2022   NA 136 07/27/2022   K 4.3 07/27/2022   CL 104 07/27/2022   CREATININE 1.28 (H) 07/27/2022   BUN 29 (H) 07/27/2022   CO2 24 07/27/2022   TSH 0.06 (L) 07/27/2022   INR 0.99 04/04/2018   HGBA1C 6.7 (H) 07/27/2022   MICROALBUR 3.6 (H) 05/28/2010     Assessment & Plan:    See Problem List for Assessment and Plan of chronic medical problems.

## 2023-02-21 ENCOUNTER — Ambulatory Visit (INDEPENDENT_AMBULATORY_CARE_PROVIDER_SITE_OTHER): Payer: Medicare Other | Admitting: Internal Medicine

## 2023-02-21 ENCOUNTER — Encounter: Payer: Self-pay | Admitting: Internal Medicine

## 2023-02-21 VITALS — BP 108/72 | HR 78 | Temp 98.0°F | Ht 60.0 in

## 2023-02-21 DIAGNOSIS — E039 Hypothyroidism, unspecified: Secondary | ICD-10-CM

## 2023-02-21 DIAGNOSIS — G479 Sleep disorder, unspecified: Secondary | ICD-10-CM

## 2023-02-21 DIAGNOSIS — N1832 Chronic kidney disease, stage 3b: Secondary | ICD-10-CM | POA: Diagnosis not present

## 2023-02-21 DIAGNOSIS — E1165 Type 2 diabetes mellitus with hyperglycemia: Secondary | ICD-10-CM

## 2023-02-21 DIAGNOSIS — R7989 Other specified abnormal findings of blood chemistry: Secondary | ICD-10-CM

## 2023-02-21 DIAGNOSIS — K219 Gastro-esophageal reflux disease without esophagitis: Secondary | ICD-10-CM | POA: Diagnosis not present

## 2023-02-21 DIAGNOSIS — I10 Essential (primary) hypertension: Secondary | ICD-10-CM | POA: Diagnosis not present

## 2023-02-21 DIAGNOSIS — E782 Mixed hyperlipidemia: Secondary | ICD-10-CM

## 2023-02-21 DIAGNOSIS — I25810 Atherosclerosis of coronary artery bypass graft(s) without angina pectoris: Secondary | ICD-10-CM

## 2023-02-21 DIAGNOSIS — F3289 Other specified depressive episodes: Secondary | ICD-10-CM

## 2023-02-21 DIAGNOSIS — F419 Anxiety disorder, unspecified: Secondary | ICD-10-CM

## 2023-02-21 LAB — CBC WITH DIFFERENTIAL/PLATELET
Basophils Absolute: 0 10*3/uL (ref 0.0–0.1)
Basophils Relative: 0.6 % (ref 0.0–3.0)
Eosinophils Absolute: 0.1 10*3/uL (ref 0.0–0.7)
Eosinophils Relative: 1.2 % (ref 0.0–5.0)
HCT: 35 % — ABNORMAL LOW (ref 36.0–46.0)
Hemoglobin: 12 g/dL (ref 12.0–15.0)
Lymphocytes Relative: 21.4 % (ref 12.0–46.0)
Lymphs Abs: 1.4 10*3/uL (ref 0.7–4.0)
MCHC: 34.3 g/dL (ref 30.0–36.0)
MCV: 82.7 fl (ref 78.0–100.0)
Monocytes Absolute: 0.4 10*3/uL (ref 0.1–1.0)
Monocytes Relative: 5.5 % (ref 3.0–12.0)
Neutro Abs: 4.6 10*3/uL (ref 1.4–7.7)
Neutrophils Relative %: 71.3 % (ref 43.0–77.0)
Platelets: 227 10*3/uL (ref 150.0–400.0)
RBC: 4.23 Mil/uL (ref 3.87–5.11)
RDW: 15.4 % (ref 11.5–15.5)
WBC: 6.4 10*3/uL (ref 4.0–10.5)

## 2023-02-21 LAB — COMPREHENSIVE METABOLIC PANEL
ALT: 15 U/L (ref 0–35)
AST: 18 U/L (ref 0–37)
Albumin: 4.1 g/dL (ref 3.5–5.2)
Alkaline Phosphatase: 86 U/L (ref 39–117)
BUN: 31 mg/dL — ABNORMAL HIGH (ref 6–23)
CO2: 25 mEq/L (ref 19–32)
Calcium: 9.4 mg/dL (ref 8.4–10.5)
Chloride: 102 mEq/L (ref 96–112)
Creatinine, Ser: 1.43 mg/dL — ABNORMAL HIGH (ref 0.40–1.20)
GFR: 32.1 mL/min — ABNORMAL LOW (ref >60.00)
Glucose, Bld: 184 mg/dL — ABNORMAL HIGH (ref 70–99)
Potassium: 4.5 mEq/L (ref 3.5–5.1)
Sodium: 136 mEq/L (ref 135–145)
Total Bilirubin: 0.9 mg/dL (ref 0.2–1.2)
Total Protein: 7.1 g/dL (ref 6.0–8.3)

## 2023-02-21 LAB — LIPID PANEL
Cholesterol: 157 mg/dL (ref 0–200)
HDL: 36.9 mg/dL — ABNORMAL LOW (ref >39.00)
LDL Cholesterol: 83 mg/dL (ref 0–99)
NonHDL: 119.62
Total CHOL/HDL Ratio: 4
Triglycerides: 185 mg/dL — ABNORMAL HIGH (ref 0.0–149.0)
VLDL: 37 mg/dL (ref 0.0–40.0)

## 2023-02-21 LAB — TSH: TSH: 0.08 u[IU]/mL — ABNORMAL LOW (ref 0.35–5.50)

## 2023-02-21 LAB — HEMOGLOBIN A1C: Hgb A1c MFr Bld: 6.8 % — ABNORMAL HIGH (ref 4.6–6.5)

## 2023-02-21 LAB — VITAMIN B12: Vitamin B-12: 360 pg/mL (ref 211–911)

## 2023-02-21 LAB — VITAMIN D 25 HYDROXY (VIT D DEFICIENCY, FRACTURES): VITD: 18.51 ng/mL — ABNORMAL LOW (ref 30.00–100.00)

## 2023-02-21 NOTE — Assessment & Plan Note (Signed)
Chronic Seems to be controlled Continue duloxetine 60 mg daily 

## 2023-02-21 NOTE — Assessment & Plan Note (Signed)
Chronic denies dysphagia  Continue pantoprazole 40 mg daily

## 2023-02-21 NOTE — Assessment & Plan Note (Signed)
Chronic H/o low B12 Check B12 level

## 2023-02-21 NOTE — Patient Instructions (Addendum)
      Blood work was ordered.   The lab is on the first floor.    Medications changes include :   none     Return in about 6 months (around 08/24/2023) for follow up.

## 2023-02-21 NOTE — Assessment & Plan Note (Signed)
Chronic stable CMP, CBC, vit D level

## 2023-02-21 NOTE — Assessment & Plan Note (Signed)
Chronic ?Controlled, Stable ?Continue duloxetine 60 mg daily ?

## 2023-02-21 NOTE — Assessment & Plan Note (Signed)
Chronic BP controlled Continue metoprolol 25 mg bid 

## 2023-02-21 NOTE — Assessment & Plan Note (Signed)
Chronic Clinically hypothyroid Continue levothyroxine 112 mcg daily Check TSH

## 2023-02-21 NOTE — Assessment & Plan Note (Signed)
Chronic Has been on many medications in the past Sleeps during the day and has issues with sleeping at night Will not prescribe anything else Encouraged her to not nap during the day so she can sleep during the day

## 2023-02-21 NOTE — Assessment & Plan Note (Signed)
Chronic Recently saw cardio No concerning symptoms on angina Continue current medications

## 2023-02-21 NOTE — Assessment & Plan Note (Signed)
Chronic Check lipid panel Continue atorvastatin 80 mg daily 

## 2023-02-21 NOTE — Assessment & Plan Note (Signed)
Chronic Check A1c Continue glimepiride 1 mg every morning 

## 2023-02-27 MED ORDER — LEVOTHYROXINE SODIUM 100 MCG PO TABS: 100.0000 ug | ORAL_TABLET | Freq: Every day | ORAL | 3 refills | Status: DC

## 2023-02-27 MED ORDER — VITAMIN D (ERGOCALCIFEROL) 1.25 MG (50000 UNIT) PO CAPS: 50000.0000 [IU] | ORAL_CAPSULE | ORAL | 0 refills | Status: AC

## 2023-02-27 NOTE — Addendum Note (Signed)
Addended by: Binnie Rail on: 02/27/2023 04:16 PM   Modules accepted: Orders

## 2023-03-02 ENCOUNTER — Ambulatory Visit (HOSPITAL_COMMUNITY): Payer: Medicare Other | Attending: Internal Medicine

## 2023-03-02 DIAGNOSIS — I25119 Atherosclerotic heart disease of native coronary artery with unspecified angina pectoris: Secondary | ICD-10-CM | POA: Diagnosis not present

## 2023-03-02 DIAGNOSIS — I5032 Chronic diastolic (congestive) heart failure: Secondary | ICD-10-CM

## 2023-03-02 DIAGNOSIS — Z950 Presence of cardiac pacemaker: Secondary | ICD-10-CM | POA: Insufficient documentation

## 2023-03-02 DIAGNOSIS — E782 Mixed hyperlipidemia: Secondary | ICD-10-CM | POA: Insufficient documentation

## 2023-03-02 LAB — ECHOCARDIOGRAM COMPLETE
Area-P 1/2: 1.99 cm2
Est EF: 40
P 1/2 time: 537 msec
S' Lateral: 3.7 cm

## 2023-03-03 ENCOUNTER — Other Ambulatory Visit: Payer: Self-pay | Admitting: Internal Medicine

## 2023-03-16 NOTE — Progress Notes (Signed)
Remote pacemaker transmission.   

## 2023-03-22 ENCOUNTER — Other Ambulatory Visit: Payer: Self-pay | Admitting: Internal Medicine

## 2023-03-22 DIAGNOSIS — I200 Unstable angina: Secondary | ICD-10-CM

## 2023-03-22 DIAGNOSIS — I25700 Atherosclerosis of coronary artery bypass graft(s), unspecified, with unstable angina pectoris: Secondary | ICD-10-CM

## 2023-03-24 ENCOUNTER — Other Ambulatory Visit: Payer: Self-pay | Admitting: Internal Medicine

## 2023-03-24 ENCOUNTER — Other Ambulatory Visit: Payer: Self-pay | Admitting: Gastroenterology

## 2023-03-26 ENCOUNTER — Other Ambulatory Visit: Payer: Self-pay | Admitting: Internal Medicine

## 2023-03-29 ENCOUNTER — Telehealth: Payer: Self-pay | Admitting: Internal Medicine

## 2023-03-29 NOTE — Telephone Encounter (Signed)
Forms retrieved from box today 

## 2023-03-29 NOTE — Telephone Encounter (Signed)
Family member dropped off paperwork for provider to fill out. Needs to be emailed to facility when complete - all instructions are in envelope.  Paperwork is up front in provider's box.

## 2023-03-30 ENCOUNTER — Ambulatory Visit (INDEPENDENT_AMBULATORY_CARE_PROVIDER_SITE_OTHER): Payer: Medicare Other | Admitting: *Deleted

## 2023-03-30 DIAGNOSIS — Z111 Encounter for screening for respiratory tuberculosis: Secondary | ICD-10-CM

## 2023-03-30 DIAGNOSIS — Z23 Encounter for immunization: Secondary | ICD-10-CM

## 2023-03-31 DIAGNOSIS — Z0279 Encounter for issue of other medical certificate: Secondary | ICD-10-CM

## 2023-03-31 NOTE — Telephone Encounter (Signed)
Forms completed and emailed today

## 2023-04-01 LAB — TB SKIN TEST
Induration: NEGATIVE mm
TB Skin Test: NEGATIVE

## 2023-04-04 DIAGNOSIS — H353122 Nonexudative age-related macular degeneration, left eye, intermediate dry stage: Secondary | ICD-10-CM | POA: Diagnosis not present

## 2023-04-04 DIAGNOSIS — H35033 Hypertensive retinopathy, bilateral: Secondary | ICD-10-CM | POA: Diagnosis not present

## 2023-04-04 DIAGNOSIS — H26493 Other secondary cataract, bilateral: Secondary | ICD-10-CM | POA: Diagnosis not present

## 2023-04-04 DIAGNOSIS — H353212 Exudative age-related macular degeneration, right eye, with inactive choroidal neovascularization: Secondary | ICD-10-CM | POA: Diagnosis not present

## 2023-04-06 ENCOUNTER — Other Ambulatory Visit: Payer: Self-pay | Admitting: Internal Medicine

## 2023-04-19 ENCOUNTER — Telehealth: Payer: Self-pay | Admitting: Internal Medicine

## 2023-04-19 NOTE — Telephone Encounter (Signed)
Paperwork faxed today.

## 2023-04-19 NOTE — Telephone Encounter (Signed)
Hilaria Ota from [facility] assisted living called to say she faxed over paper work for patient on 04/15/2023. She wanted to make sure it has been received. Best callback for her is [phone].

## 2023-04-20 ENCOUNTER — Telehealth: Payer: Self-pay | Admitting: Radiology

## 2023-04-20 NOTE — Telephone Encounter (Signed)
Contacted Janet Steele to schedule their annual wellness visit. Patient declined to schedule AWV at this time.  Royston Bekele K. CMA

## 2023-04-21 ENCOUNTER — Telehealth: Payer: Self-pay | Admitting: Internal Medicine

## 2023-04-21 NOTE — Telephone Encounter (Signed)
Patient's son dropped off DNR paperwork that facility says need to be completed by provider. Please call when ready at [phone] [facility]) Paperwork placed in provider's box up front.

## 2023-04-22 NOTE — Telephone Encounter (Signed)
Paperwork placed in Dr. Burns office to sign. 

## 2023-04-22 NOTE — Telephone Encounter (Signed)
Patient's son contacted and paperwork left up front.

## 2023-04-26 ENCOUNTER — Other Ambulatory Visit: Payer: Self-pay | Admitting: Internal Medicine

## 2023-04-26 DIAGNOSIS — I200 Unstable angina: Secondary | ICD-10-CM

## 2023-04-26 DIAGNOSIS — I25700 Atherosclerosis of coronary artery bypass graft(s), unspecified, with unstable angina pectoris: Secondary | ICD-10-CM

## 2023-05-03 ENCOUNTER — Other Ambulatory Visit: Payer: Self-pay | Admitting: Internal Medicine

## 2023-05-03 DIAGNOSIS — I200 Unstable angina: Secondary | ICD-10-CM

## 2023-05-03 DIAGNOSIS — I25700 Atherosclerosis of coronary artery bypass graft(s), unspecified, with unstable angina pectoris: Secondary | ICD-10-CM

## 2023-05-23 ENCOUNTER — Telehealth: Payer: Self-pay | Admitting: Internal Medicine

## 2023-05-23 DIAGNOSIS — R5381 Other malaise: Secondary | ICD-10-CM

## 2023-05-23 DIAGNOSIS — R2689 Other abnormalities of gait and mobility: Secondary | ICD-10-CM

## 2023-05-23 DIAGNOSIS — R296 Repeated falls: Secondary | ICD-10-CM

## 2023-05-23 NOTE — Telephone Encounter (Signed)
Pt fell over the weekend and would now like a referral to start OT & PT to help with strength and balance sent to her living facility.   Please call or email Noreene Filbert for any additional info:  [phone]   [email or web address]

## 2023-05-24 NOTE — Telephone Encounter (Signed)
Referral was ordered. 

## 2023-05-26 ENCOUNTER — Other Ambulatory Visit: Payer: Self-pay | Admitting: Internal Medicine

## 2023-05-29 NOTE — Progress Notes (Unsigned)
Subjective:    Patient ID: Janet Steele, female    DOB: 1928-12-23, 89 y.o.   MRN: [id number]      HPI Janet Steele is here for No chief complaint on file.    Needs referral to physical therapy -she did receive physical therapy where she lives-she does need referral.  She has history of a stroke with residual deficits of right-sided weakness, mild dysphagia and expressive aphasia.  She is physically deconditioned and has poor balance.  She uses a rollator to ambulate or a walker if going long distances.   Hit right elbow x 1 week ago.  She did have a wound there and she has been applying Neosporin and keeping it wrapped.  It is sore.     Medications and allergies reviewed with patient and updated if appropriate.  Current Outpatient Medications on File Prior to Visit  Medication Sig Dispense Refill   aspirin (ASPIRIN LOW DOSE) 81 MG chewable tablet GIVE 1 TAB BY MOUTH ONCE DAILY 30 tablet 5   aspirin EC 81 MG tablet Take 81 mg by mouth daily.     atorvastatin (LIPITOR) 80 MG tablet TAKE 1 TABLET BY MOUTH EVERY DAY AT BEDTIME 90 tablet 2   clopidogrel (PLAVIX) 75 MG tablet Take 1 tablet (75 mg total) by mouth daily. F/U appt due in Sept for future refills 30 tablet 5   donepezil (ARICEPT) 5 MG tablet Take 1 tablet (5 mg total) by mouth at bedtime. F/U appt due in Sept for future refills 30 tablet 5   DULoxetine (CYMBALTA) 60 MG capsule GIVE 1 CAP BY MOUTH ONCE DAILY F/U appt due in Sept for future refills 30 capsule 5   glimepiride (AMARYL) 1 MG tablet Take 1 tablet (1 mg total) by mouth daily with breakfast. F/U appt due in Sept for future refills 30 tablet 5   levothyroxine (SYNTHROID) 100 MCG tablet Take 1 tablet (100 mcg total) by mouth daily before breakfast. F/U appt due in Sept for future refills 30 tablet 5   metoprolol tartrate (LOPRESSOR) 25 MG tablet TAKE 1 TAB BY MOUTH TWICE DAILY 60 tablet 11   nitroGLYCERIN (NITROSTAT) 0.4 MG SL tablet TAKE 1 TAB SUBLINGUALLY EVERY 5  MINUTES FOR 3 DOSES AS NEEDED FOR CHEST PAIN. IF NO RELIEF CALL MD. 25 tablet 5   pantoprazole (PROTONIX) 40 MG tablet Take 1 tablet (40 mg total) by mouth 2 (two) times daily. F/U appt due in Sept for future refills 60 tablet 5   WIXELA INHUB 250-50 MCG/ACT AEPB INHALE 1 INTO LUNGS EVERY MORNING AND AT BEDTIME 60 each 11   No current facility-administered medications on file prior to visit.    Review of Systems     Objective:   Vitals:   05/30/23 1325  BP: 110/68  Pulse: 64  Temp: 98 F (36.7 C)  SpO2: 97%   BP Readings from Last 3 Encounters:  05/30/23 110/68  02/21/23 108/72  02/02/23 110/80   Wt Readings from Last 3 Encounters:  02/02/23 106 lb 9.6 oz (48.4 kg)  10/04/22 100 lb (45.4 kg)  09/07/22 100 lb (45.4 kg)   Body mass index is 20.82 kg/m.    Physical Exam Constitutional:      General: She is not in acute distress.    Appearance: Normal appearance.  HENT:     Head: Normocephalic and atraumatic.  Eyes:     Conjunctiva/sclera: Conjunctivae normal.  Musculoskeletal:     Right lower leg: No edema.  Left lower leg: No edema.  Skin:    General: Skin is warm and dry.     Findings: No rash.     Comments: Skin abrasion on right elbow-granulation tissue present.  No bleeding or discharge.  No surrounding erythema or swelling  Neurological:     Mental Status: She is alert. Mental status is at baseline.  Psychiatric:        Mood and Affect: Mood normal.        Behavior: Behavior normal.            Assessment & Plan:    See Problem List for Assessment and Plan of chronic medical problems.

## 2023-05-30 ENCOUNTER — Ambulatory Visit (INDEPENDENT_AMBULATORY_CARE_PROVIDER_SITE_OTHER): Payer: Medicare Other | Admitting: Internal Medicine

## 2023-05-30 ENCOUNTER — Encounter: Payer: Self-pay | Admitting: Internal Medicine

## 2023-05-30 VITALS — BP 110/68 | HR 64 | Temp 98.0°F | Ht 60.0 in

## 2023-05-30 DIAGNOSIS — R5381 Other malaise: Secondary | ICD-10-CM

## 2023-05-30 DIAGNOSIS — I10 Essential (primary) hypertension: Secondary | ICD-10-CM

## 2023-05-30 DIAGNOSIS — I6930 Unspecified sequelae of cerebral infarction: Secondary | ICD-10-CM

## 2023-05-30 NOTE — Assessment & Plan Note (Signed)
Chronic BP controlled Continue metoprolol 25 mg bid 

## 2023-05-30 NOTE — Assessment & Plan Note (Signed)
Chronic Would benefit greatly from physical therapy and Occupational Therapy Will order both for her living facility Will order-order placed on paperwork from facility-May need official order and they will let me know-will work on balance, strength, physical deconditioning

## 2023-05-30 NOTE — Assessment & Plan Note (Signed)
Chronic Has been inactive and physical deconditioning has worsened Referral order for PT and OT

## 2023-06-06 DIAGNOSIS — R2681 Unsteadiness on feet: Secondary | ICD-10-CM | POA: Diagnosis not present

## 2023-06-06 DIAGNOSIS — R296 Repeated falls: Secondary | ICD-10-CM | POA: Diagnosis not present

## 2023-06-06 DIAGNOSIS — M6281 Muscle weakness (generalized): Secondary | ICD-10-CM | POA: Diagnosis not present

## 2023-06-06 DIAGNOSIS — R2689 Other abnormalities of gait and mobility: Secondary | ICD-10-CM | POA: Diagnosis not present

## 2023-06-07 DIAGNOSIS — R2689 Other abnormalities of gait and mobility: Secondary | ICD-10-CM | POA: Diagnosis not present

## 2023-06-07 DIAGNOSIS — R2681 Unsteadiness on feet: Secondary | ICD-10-CM | POA: Diagnosis not present

## 2023-06-07 DIAGNOSIS — R296 Repeated falls: Secondary | ICD-10-CM | POA: Diagnosis not present

## 2023-06-07 DIAGNOSIS — M6281 Muscle weakness (generalized): Secondary | ICD-10-CM | POA: Diagnosis not present

## 2023-06-13 DIAGNOSIS — R296 Repeated falls: Secondary | ICD-10-CM | POA: Diagnosis not present

## 2023-06-13 DIAGNOSIS — R2689 Other abnormalities of gait and mobility: Secondary | ICD-10-CM | POA: Diagnosis not present

## 2023-06-13 DIAGNOSIS — M6281 Muscle weakness (generalized): Secondary | ICD-10-CM | POA: Diagnosis not present

## 2023-06-13 DIAGNOSIS — R2681 Unsteadiness on feet: Secondary | ICD-10-CM | POA: Diagnosis not present

## 2023-06-14 DIAGNOSIS — R296 Repeated falls: Secondary | ICD-10-CM | POA: Diagnosis not present

## 2023-06-14 DIAGNOSIS — R2689 Other abnormalities of gait and mobility: Secondary | ICD-10-CM | POA: Diagnosis not present

## 2023-06-14 DIAGNOSIS — M6281 Muscle weakness (generalized): Secondary | ICD-10-CM | POA: Diagnosis not present

## 2023-06-14 DIAGNOSIS — R2681 Unsteadiness on feet: Secondary | ICD-10-CM | POA: Diagnosis not present

## 2023-06-15 DIAGNOSIS — R2689 Other abnormalities of gait and mobility: Secondary | ICD-10-CM | POA: Diagnosis not present

## 2023-06-15 DIAGNOSIS — M6281 Muscle weakness (generalized): Secondary | ICD-10-CM | POA: Diagnosis not present

## 2023-06-15 DIAGNOSIS — R2681 Unsteadiness on feet: Secondary | ICD-10-CM | POA: Diagnosis not present

## 2023-06-15 DIAGNOSIS — R296 Repeated falls: Secondary | ICD-10-CM | POA: Diagnosis not present

## 2023-06-17 DIAGNOSIS — M6281 Muscle weakness (generalized): Secondary | ICD-10-CM | POA: Diagnosis not present

## 2023-06-17 DIAGNOSIS — R2689 Other abnormalities of gait and mobility: Secondary | ICD-10-CM | POA: Diagnosis not present

## 2023-06-17 DIAGNOSIS — R2681 Unsteadiness on feet: Secondary | ICD-10-CM | POA: Diagnosis not present

## 2023-06-17 DIAGNOSIS — R296 Repeated falls: Secondary | ICD-10-CM | POA: Diagnosis not present

## 2023-06-20 DIAGNOSIS — R2689 Other abnormalities of gait and mobility: Secondary | ICD-10-CM | POA: Diagnosis not present

## 2023-06-20 DIAGNOSIS — R2681 Unsteadiness on feet: Secondary | ICD-10-CM | POA: Diagnosis not present

## 2023-06-20 DIAGNOSIS — R296 Repeated falls: Secondary | ICD-10-CM | POA: Diagnosis not present

## 2023-06-20 DIAGNOSIS — M6281 Muscle weakness (generalized): Secondary | ICD-10-CM | POA: Diagnosis not present

## 2023-06-22 DIAGNOSIS — M6281 Muscle weakness (generalized): Secondary | ICD-10-CM | POA: Diagnosis not present

## 2023-06-22 DIAGNOSIS — R296 Repeated falls: Secondary | ICD-10-CM | POA: Diagnosis not present

## 2023-06-22 DIAGNOSIS — R2689 Other abnormalities of gait and mobility: Secondary | ICD-10-CM | POA: Diagnosis not present

## 2023-06-22 DIAGNOSIS — R2681 Unsteadiness on feet: Secondary | ICD-10-CM | POA: Diagnosis not present

## 2023-06-23 DIAGNOSIS — R2689 Other abnormalities of gait and mobility: Secondary | ICD-10-CM | POA: Diagnosis not present

## 2023-06-23 DIAGNOSIS — M6281 Muscle weakness (generalized): Secondary | ICD-10-CM | POA: Diagnosis not present

## 2023-06-23 DIAGNOSIS — R2681 Unsteadiness on feet: Secondary | ICD-10-CM | POA: Diagnosis not present

## 2023-06-23 DIAGNOSIS — R296 Repeated falls: Secondary | ICD-10-CM | POA: Diagnosis not present

## 2023-06-24 DIAGNOSIS — R296 Repeated falls: Secondary | ICD-10-CM | POA: Diagnosis not present

## 2023-06-24 DIAGNOSIS — R2689 Other abnormalities of gait and mobility: Secondary | ICD-10-CM | POA: Diagnosis not present

## 2023-06-24 DIAGNOSIS — R2681 Unsteadiness on feet: Secondary | ICD-10-CM | POA: Diagnosis not present

## 2023-06-24 DIAGNOSIS — M6281 Muscle weakness (generalized): Secondary | ICD-10-CM | POA: Diagnosis not present

## 2023-06-27 DIAGNOSIS — R2689 Other abnormalities of gait and mobility: Secondary | ICD-10-CM | POA: Diagnosis not present

## 2023-06-27 DIAGNOSIS — R296 Repeated falls: Secondary | ICD-10-CM | POA: Diagnosis not present

## 2023-06-27 DIAGNOSIS — M6281 Muscle weakness (generalized): Secondary | ICD-10-CM | POA: Diagnosis not present

## 2023-06-27 DIAGNOSIS — R2681 Unsteadiness on feet: Secondary | ICD-10-CM | POA: Diagnosis not present

## 2023-06-28 DIAGNOSIS — R2681 Unsteadiness on feet: Secondary | ICD-10-CM | POA: Diagnosis not present

## 2023-06-28 DIAGNOSIS — M6281 Muscle weakness (generalized): Secondary | ICD-10-CM | POA: Diagnosis not present

## 2023-06-28 DIAGNOSIS — R2689 Other abnormalities of gait and mobility: Secondary | ICD-10-CM | POA: Diagnosis not present

## 2023-06-28 DIAGNOSIS — R296 Repeated falls: Secondary | ICD-10-CM | POA: Diagnosis not present

## 2023-06-29 DIAGNOSIS — R296 Repeated falls: Secondary | ICD-10-CM | POA: Diagnosis not present

## 2023-06-29 DIAGNOSIS — R2689 Other abnormalities of gait and mobility: Secondary | ICD-10-CM | POA: Diagnosis not present

## 2023-06-29 DIAGNOSIS — R2681 Unsteadiness on feet: Secondary | ICD-10-CM | POA: Diagnosis not present

## 2023-06-29 DIAGNOSIS — M6281 Muscle weakness (generalized): Secondary | ICD-10-CM | POA: Diagnosis not present

## 2023-07-05 DIAGNOSIS — R2681 Unsteadiness on feet: Secondary | ICD-10-CM | POA: Diagnosis not present

## 2023-07-05 DIAGNOSIS — M6281 Muscle weakness (generalized): Secondary | ICD-10-CM | POA: Diagnosis not present

## 2023-07-05 DIAGNOSIS — R2689 Other abnormalities of gait and mobility: Secondary | ICD-10-CM | POA: Diagnosis not present

## 2023-07-05 DIAGNOSIS — R296 Repeated falls: Secondary | ICD-10-CM | POA: Diagnosis not present

## 2023-07-07 ENCOUNTER — Ambulatory Visit: Payer: Medicare Other | Admitting: Nurse Practitioner

## 2023-07-07 DIAGNOSIS — R2689 Other abnormalities of gait and mobility: Secondary | ICD-10-CM | POA: Diagnosis not present

## 2023-07-07 DIAGNOSIS — R2681 Unsteadiness on feet: Secondary | ICD-10-CM | POA: Diagnosis not present

## 2023-07-07 DIAGNOSIS — M6281 Muscle weakness (generalized): Secondary | ICD-10-CM | POA: Diagnosis not present

## 2023-07-07 DIAGNOSIS — R296 Repeated falls: Secondary | ICD-10-CM | POA: Diagnosis not present

## 2023-07-08 DIAGNOSIS — R296 Repeated falls: Secondary | ICD-10-CM | POA: Diagnosis not present

## 2023-07-08 DIAGNOSIS — R2689 Other abnormalities of gait and mobility: Secondary | ICD-10-CM | POA: Diagnosis not present

## 2023-07-08 DIAGNOSIS — M6281 Muscle weakness (generalized): Secondary | ICD-10-CM | POA: Diagnosis not present

## 2023-07-08 DIAGNOSIS — R2681 Unsteadiness on feet: Secondary | ICD-10-CM | POA: Diagnosis not present

## 2023-07-09 DIAGNOSIS — R2681 Unsteadiness on feet: Secondary | ICD-10-CM | POA: Diagnosis not present

## 2023-07-09 DIAGNOSIS — R2689 Other abnormalities of gait and mobility: Secondary | ICD-10-CM | POA: Diagnosis not present

## 2023-07-09 DIAGNOSIS — M6281 Muscle weakness (generalized): Secondary | ICD-10-CM | POA: Diagnosis not present

## 2023-07-09 DIAGNOSIS — R296 Repeated falls: Secondary | ICD-10-CM | POA: Diagnosis not present

## 2023-07-11 ENCOUNTER — Ambulatory Visit: Payer: Medicare Other | Admitting: Family Medicine

## 2023-07-11 DIAGNOSIS — R2681 Unsteadiness on feet: Secondary | ICD-10-CM | POA: Diagnosis not present

## 2023-07-11 DIAGNOSIS — R2689 Other abnormalities of gait and mobility: Secondary | ICD-10-CM | POA: Diagnosis not present

## 2023-07-11 DIAGNOSIS — M6281 Muscle weakness (generalized): Secondary | ICD-10-CM | POA: Diagnosis not present

## 2023-07-11 DIAGNOSIS — R296 Repeated falls: Secondary | ICD-10-CM | POA: Diagnosis not present

## 2023-07-12 ENCOUNTER — Ambulatory Visit: Payer: Medicare Other | Attending: Cardiovascular Disease | Admitting: Cardiovascular Disease

## 2023-07-12 ENCOUNTER — Encounter: Payer: Self-pay | Admitting: Cardiovascular Disease

## 2023-07-12 VITALS — BP 124/60 | HR 69 | Ht 60.0 in | Wt 114.2 lb

## 2023-07-12 DIAGNOSIS — I5032 Chronic diastolic (congestive) heart failure: Secondary | ICD-10-CM | POA: Diagnosis not present

## 2023-07-12 NOTE — Patient Instructions (Signed)
Medication Instructions:  Your physician recommends that you continue on your current medications as directed. Please refer to the Current Medication list given to you today. *If you need a refill on your cardiac medications before your next appointment, please call your pharmacy*   Follow-Up: At [facility], you and your health needs are our priority.  As part of our continuing mission to provide you with exceptional heart care, we have created designated Provider Care Teams.  These Care Teams include your primary Cardiologist (physician) and Advanced Practice Providers (APPs -  Physician Assistants and Nurse Practitioners) who all work together to provide you with the care you need, when you need it.  We recommend signing up for the patient portal called "MyChart".  Sign up information is provided on this After Visit Summary.  MyChart is used to connect with patients for Virtual Visits (Telemedicine).  Patients are able to view lab/test results, encounter notes, upcoming appointments, etc.  Non-urgent messages can be sent to your provider as well.   To learn more about what you can do with MyChart, go to [email or web address].    Your next appointment:   6 month(s)  Provider:   You will see one of the following Advanced Practice Providers on your designated Care Team:   Francis Dowse, Charlott Holler [address] [place], [place] Sherie Don, NP Canary Brim, NP

## 2023-07-12 NOTE — Progress Notes (Signed)
  Electrophysiology Office Note:    Date:  07/12/2023   ID:  Janet Steele, DOB 1929-12-04, MRN [id number]  PCP:  Pincus Sanes, MD   [facility] Providers Cardiologist:  Tonny Bollman, MD     Referring MD: Pincus Sanes, MD   History of Present Illness:    Janet Steele is a 89 y.o. female with a medical history significant for complicated coronary artery disease status post bypass grafting surgeries in 1992 in 2003 device management, referred for pacemaker management.     She has a Medtronic dual-chamber pacemaker placed by Dr. Johney Frame in August 2013 for symptomatic bradycardia due to sick sinus syndrome.     she has no device related complaints -- no new tenderness, drainage, redness.    EKGs/Labs/Other Studies Reviewed Today:    Echocardiogram:  TTE 2018 EF 60 to 65%.   Monitors:   Stress testing:   Advanced imaging:    Cardiac catherization  2017 -left heart cath Reviewed in epic  EKG:         Physical Exam:    VS:  BP 124/60   Pulse 69   Ht 5' (1.524 m)   Wt 114 lb 3.2 oz (51.8 kg)   SpO2 97%   BMI 22.30 kg/m     Wt Readings from Last 3 Encounters:  07/12/23 114 lb 3.2 oz (51.8 kg)  02/02/23 106 lb 9.6 oz (48.4 kg)  10/04/22 100 lb (45.4 kg)     GEN:  Well nourished, well developed in no acute distress CARDIAC: RRR, no murmurs, rubs, gallops The device site is normal -- no tenderness, edema, drainage, redness, threatened erosion. RESPIRATORY:  Normal work of breathing MUSCULOSKELETAL: no edema  Device was interrogated in clinic today.  I reviewed the interrogation in detail.  See Paceart for report   ASSESSMENT & PLAN:    Symptomatic sinus bradycardia Medtronic dual-chamber pacemaker normal function She is not device dependent today  Coronary disease Stable Management per Dr. Excell Seltzer    Signed, Maurice Small, MD  07/12/2023 11:16 AM    [facility]

## 2023-07-14 DIAGNOSIS — R2689 Other abnormalities of gait and mobility: Secondary | ICD-10-CM | POA: Diagnosis not present

## 2023-07-14 DIAGNOSIS — R296 Repeated falls: Secondary | ICD-10-CM | POA: Diagnosis not present

## 2023-07-14 DIAGNOSIS — R2681 Unsteadiness on feet: Secondary | ICD-10-CM | POA: Diagnosis not present

## 2023-07-14 DIAGNOSIS — M6281 Muscle weakness (generalized): Secondary | ICD-10-CM | POA: Diagnosis not present

## 2023-07-15 DIAGNOSIS — M6281 Muscle weakness (generalized): Secondary | ICD-10-CM | POA: Diagnosis not present

## 2023-07-15 DIAGNOSIS — R2689 Other abnormalities of gait and mobility: Secondary | ICD-10-CM | POA: Diagnosis not present

## 2023-07-15 DIAGNOSIS — R2681 Unsteadiness on feet: Secondary | ICD-10-CM | POA: Diagnosis not present

## 2023-07-15 DIAGNOSIS — R296 Repeated falls: Secondary | ICD-10-CM | POA: Diagnosis not present

## 2023-07-21 ENCOUNTER — Encounter (INDEPENDENT_AMBULATORY_CARE_PROVIDER_SITE_OTHER): Payer: Self-pay

## 2023-07-21 DIAGNOSIS — M2559 Pain in other specified joint: Secondary | ICD-10-CM | POA: Diagnosis not present

## 2023-07-21 DIAGNOSIS — R2681 Unsteadiness on feet: Secondary | ICD-10-CM | POA: Diagnosis not present

## 2023-07-21 DIAGNOSIS — R2689 Other abnormalities of gait and mobility: Secondary | ICD-10-CM | POA: Diagnosis not present

## 2023-07-25 DIAGNOSIS — M2559 Pain in other specified joint: Secondary | ICD-10-CM | POA: Diagnosis not present

## 2023-07-25 DIAGNOSIS — R2681 Unsteadiness on feet: Secondary | ICD-10-CM | POA: Diagnosis not present

## 2023-07-25 DIAGNOSIS — R2689 Other abnormalities of gait and mobility: Secondary | ICD-10-CM | POA: Diagnosis not present

## 2023-07-26 DIAGNOSIS — R2681 Unsteadiness on feet: Secondary | ICD-10-CM | POA: Diagnosis not present

## 2023-07-26 DIAGNOSIS — R2689 Other abnormalities of gait and mobility: Secondary | ICD-10-CM | POA: Diagnosis not present

## 2023-07-26 DIAGNOSIS — M2559 Pain in other specified joint: Secondary | ICD-10-CM | POA: Diagnosis not present

## 2023-08-10 DIAGNOSIS — R2681 Unsteadiness on feet: Secondary | ICD-10-CM | POA: Diagnosis not present

## 2023-08-10 DIAGNOSIS — M2559 Pain in other specified joint: Secondary | ICD-10-CM | POA: Diagnosis not present

## 2023-08-10 DIAGNOSIS — R2689 Other abnormalities of gait and mobility: Secondary | ICD-10-CM | POA: Diagnosis not present

## 2023-08-17 DIAGNOSIS — R2681 Unsteadiness on feet: Secondary | ICD-10-CM | POA: Diagnosis not present

## 2023-08-17 DIAGNOSIS — R296 Repeated falls: Secondary | ICD-10-CM | POA: Diagnosis not present

## 2023-08-17 DIAGNOSIS — M6281 Muscle weakness (generalized): Secondary | ICD-10-CM | POA: Diagnosis not present

## 2023-08-23 ENCOUNTER — Ambulatory Visit: Payer: BLUE CROSS/BLUE SHIELD | Admitting: Internal Medicine

## 2023-08-23 DIAGNOSIS — R2681 Unsteadiness on feet: Secondary | ICD-10-CM | POA: Diagnosis not present

## 2023-08-23 DIAGNOSIS — R2689 Other abnormalities of gait and mobility: Secondary | ICD-10-CM | POA: Diagnosis not present

## 2023-08-23 DIAGNOSIS — M2559 Pain in other specified joint: Secondary | ICD-10-CM | POA: Diagnosis not present

## 2023-08-29 ENCOUNTER — Ambulatory Visit (INDEPENDENT_AMBULATORY_CARE_PROVIDER_SITE_OTHER): Payer: Self-pay

## 2023-08-29 DIAGNOSIS — M2559 Pain in other specified joint: Secondary | ICD-10-CM | POA: Diagnosis not present

## 2023-08-29 DIAGNOSIS — I495 Sick sinus syndrome: Secondary | ICD-10-CM

## 2023-08-29 DIAGNOSIS — R2681 Unsteadiness on feet: Secondary | ICD-10-CM | POA: Diagnosis not present

## 2023-08-29 DIAGNOSIS — R2689 Other abnormalities of gait and mobility: Secondary | ICD-10-CM | POA: Diagnosis not present

## 2023-08-29 LAB — CUP PACEART REMOTE DEVICE CHECK
Battery Impedance: 2068 Ohm
Battery Remaining Longevity: 33 mo
Battery Voltage: 2.74 V
Brady Statistic AP VP Percent: 4 %
Brady Statistic AP VS Percent: 96 %
Brady Statistic AS VP Percent: 0 %
Brady Statistic AS VS Percent: 0 %
Date Time Interrogation Session: 20240922155011
Implantable Lead Connection Status: 753985
Implantable Lead Connection Status: 753985
Implantable Lead Implant Date: 20130801
Implantable Lead Implant Date: 20130801
Implantable Lead Location: 753859
Implantable Lead Location: 753860
Implantable Lead Model: 5076
Implantable Lead Model: 5092
Implantable Pulse Generator Implant Date: 20130801
Lead Channel Impedance Value: 525 Ohm
Lead Channel Impedance Value: 554 Ohm
Lead Channel Pacing Threshold Amplitude: 1.125 V
Lead Channel Pacing Threshold Amplitude: 1.25 V
Lead Channel Pacing Threshold Pulse Width: 0.4 ms
Lead Channel Pacing Threshold Pulse Width: 0.4 ms
Lead Channel Setting Pacing Amplitude: 2.5 V
Lead Channel Setting Pacing Amplitude: 2.5 V
Lead Channel Setting Pacing Pulse Width: 0.4 ms
Lead Channel Setting Sensing Sensitivity: 5.6 mV
Zone Setting Status: 755011
Zone Setting Status: 755011

## 2023-09-03 DIAGNOSIS — R2689 Other abnormalities of gait and mobility: Secondary | ICD-10-CM | POA: Diagnosis not present

## 2023-09-03 DIAGNOSIS — M2559 Pain in other specified joint: Secondary | ICD-10-CM | POA: Diagnosis not present

## 2023-09-03 DIAGNOSIS — R2681 Unsteadiness on feet: Secondary | ICD-10-CM | POA: Diagnosis not present

## 2023-09-05 ENCOUNTER — Encounter: Payer: Self-pay | Admitting: Internal Medicine

## 2023-09-05 NOTE — Telephone Encounter (Signed)
error 

## 2023-09-06 DIAGNOSIS — R2681 Unsteadiness on feet: Secondary | ICD-10-CM | POA: Diagnosis not present

## 2023-09-06 DIAGNOSIS — R2689 Other abnormalities of gait and mobility: Secondary | ICD-10-CM | POA: Diagnosis not present

## 2023-09-06 DIAGNOSIS — M2559 Pain in other specified joint: Secondary | ICD-10-CM | POA: Diagnosis not present

## 2023-09-12 NOTE — Progress Notes (Signed)
Remote pacemaker transmission.   

## 2023-09-13 DIAGNOSIS — M2559 Pain in other specified joint: Secondary | ICD-10-CM | POA: Diagnosis not present

## 2023-09-13 DIAGNOSIS — R2681 Unsteadiness on feet: Secondary | ICD-10-CM | POA: Diagnosis not present

## 2023-09-13 DIAGNOSIS — R2689 Other abnormalities of gait and mobility: Secondary | ICD-10-CM | POA: Diagnosis not present

## 2023-09-21 ENCOUNTER — Ambulatory Visit: Payer: Medicare Other | Admitting: Internal Medicine

## 2023-09-21 DIAGNOSIS — R2689 Other abnormalities of gait and mobility: Secondary | ICD-10-CM | POA: Diagnosis not present

## 2023-09-21 DIAGNOSIS — M2559 Pain in other specified joint: Secondary | ICD-10-CM | POA: Diagnosis not present

## 2023-09-21 DIAGNOSIS — R2681 Unsteadiness on feet: Secondary | ICD-10-CM | POA: Diagnosis not present

## 2023-09-27 ENCOUNTER — Encounter: Payer: Self-pay | Admitting: Internal Medicine

## 2023-09-27 ENCOUNTER — Ambulatory Visit: Payer: Medicare Other | Admitting: Internal Medicine

## 2023-09-27 VITALS — BP 116/78 | HR 62 | Temp 97.9°F | Ht 60.0 in

## 2023-09-27 DIAGNOSIS — M2559 Pain in other specified joint: Secondary | ICD-10-CM | POA: Diagnosis not present

## 2023-09-27 DIAGNOSIS — R053 Chronic cough: Secondary | ICD-10-CM

## 2023-09-27 DIAGNOSIS — R2689 Other abnormalities of gait and mobility: Secondary | ICD-10-CM | POA: Diagnosis not present

## 2023-09-27 DIAGNOSIS — F03918 Unspecified dementia, unspecified severity, with other behavioral disturbance: Secondary | ICD-10-CM | POA: Diagnosis not present

## 2023-09-27 DIAGNOSIS — J410 Simple chronic bronchitis: Secondary | ICD-10-CM

## 2023-09-27 DIAGNOSIS — R2681 Unsteadiness on feet: Secondary | ICD-10-CM | POA: Diagnosis not present

## 2023-09-27 MED ORDER — GUAIFENESIN ER 600 MG PO TB12: 600.0000 mg | ORAL_TABLET | Freq: Two times a day (BID) | ORAL | 3 refills | Status: DC

## 2023-09-27 MED ORDER — OLANZAPINE 2.5 MG PO TABS: ORAL_TABLET | ORAL | 1 refills | Status: DC

## 2023-09-27 MED ORDER — AZITHROMYCIN 250 MG PO TABS: ORAL_TABLET | ORAL | 0 refills | Status: DC

## 2023-09-27 MED ORDER — ALBUTEROL SULFATE (2.5 MG/3ML) 0.083% IN NEBU: 2.5000 mg | INHALATION_SOLUTION | Freq: Two times a day (BID) | RESPIRATORY_TRACT | 3 refills | Status: AC | PRN

## 2023-09-27 NOTE — Patient Instructions (Addendum)
       Medications changes include :      zpak for cough Mucinex 600 mg twice a day Albuterol nebulizer treatments twice a day Zyprexa 2.5 mg at 4 pm   Will get you a nebulizer machine

## 2023-09-27 NOTE — Assessment & Plan Note (Signed)
Acute on chronic Some of her chronic cough is related to dysphagia and she has had evaluation for that and seeing GI I wonder if there is also an element of chronic bronchitis Acutely worse recently-related to chronic bronchitis flare or infection Start Z-Pak Trial of Mucinex 600 mg twice daily Albuterol nebulizer treatment twice daily since she is an unlikely to easily use maintenance inhaler Could consider pulmonary evaluation

## 2023-09-27 NOTE — Assessment & Plan Note (Signed)
Chronic Has developed increased sundowning in the afternoon/evening-gets angry and snaps at the staff and her husband Intermittent confusion Her son would like to get medication to help No evidence of anxiety-already on Cymbalta-continue Trial of Zyprexa 2.5 mg around 4 PM-discussed with her son we may need to alter this a little bit depending on response

## 2023-09-28 ENCOUNTER — Telehealth: Payer: Self-pay | Admitting: Internal Medicine

## 2023-09-28 NOTE — Telephone Encounter (Signed)
Faxed today

## 2023-09-28 NOTE — Telephone Encounter (Signed)
[facility] wants the order sent to them.  Please fax to [phone] and they will give to the son

## 2023-09-28 NOTE — Telephone Encounter (Signed)
Patient was recently prescribed albuterol (PROVENTIL) (2.5 MG/3ML) 0.083% nebulizer solution . They said they also need the physical nebulizer to use the medication. They would like to know if an order for it could be sent to [facility]. Best callback for Terry(son) is [phone]. Best callback for patient is [phone].

## 2023-09-30 ENCOUNTER — Telehealth: Payer: Self-pay | Admitting: Internal Medicine

## 2023-09-30 ENCOUNTER — Other Ambulatory Visit: Payer: Self-pay | Admitting: Internal Medicine

## 2023-09-30 DIAGNOSIS — R2689 Other abnormalities of gait and mobility: Secondary | ICD-10-CM | POA: Diagnosis not present

## 2023-09-30 DIAGNOSIS — M2559 Pain in other specified joint: Secondary | ICD-10-CM | POA: Diagnosis not present

## 2023-09-30 DIAGNOSIS — R2681 Unsteadiness on feet: Secondary | ICD-10-CM | POA: Diagnosis not present

## 2023-09-30 MED ORDER — OLANZAPINE 2.5 MG PO TABS: ORAL_TABLET | ORAL | 1 refills | Status: DC

## 2023-09-30 MED ORDER — GUAIFENESIN ER 600 MG PO TB12: 600.0000 mg | ORAL_TABLET | Freq: Two times a day (BID) | ORAL | 3 refills | Status: DC

## 2023-09-30 MED ORDER — AZITHROMYCIN 250 MG PO TABS: ORAL_TABLET | ORAL | 0 refills | Status: AC

## 2023-09-30 NOTE — Telephone Encounter (Signed)
Form placed in folder to be signed

## 2023-09-30 NOTE — Telephone Encounter (Signed)
Recert order faxed today.

## 2023-09-30 NOTE — Telephone Encounter (Signed)
Received OT re-certification plan of care in fax from [facility] rehab for patient. Forms will be in providers box up front.

## 2023-10-03 DIAGNOSIS — R2689 Other abnormalities of gait and mobility: Secondary | ICD-10-CM | POA: Diagnosis not present

## 2023-10-03 DIAGNOSIS — R2681 Unsteadiness on feet: Secondary | ICD-10-CM | POA: Diagnosis not present

## 2023-10-03 DIAGNOSIS — M2559 Pain in other specified joint: Secondary | ICD-10-CM | POA: Diagnosis not present

## 2023-10-04 ENCOUNTER — Other Ambulatory Visit: Payer: Self-pay | Admitting: Internal Medicine

## 2023-10-04 DIAGNOSIS — I200 Unstable angina: Secondary | ICD-10-CM

## 2023-10-04 DIAGNOSIS — I25700 Atherosclerosis of coronary artery bypass graft(s), unspecified, with unstable angina pectoris: Secondary | ICD-10-CM

## 2023-10-10 ENCOUNTER — Other Ambulatory Visit: Payer: Self-pay | Admitting: Internal Medicine

## 2023-10-10 DIAGNOSIS — L89510 Pressure ulcer of right ankle, unstageable: Secondary | ICD-10-CM

## 2023-10-14 ENCOUNTER — Telehealth: Payer: Self-pay

## 2023-10-14 ENCOUNTER — Other Ambulatory Visit (HOSPITAL_COMMUNITY): Payer: Self-pay

## 2023-10-14 DIAGNOSIS — R2681 Unsteadiness on feet: Secondary | ICD-10-CM | POA: Diagnosis not present

## 2023-10-14 DIAGNOSIS — R2689 Other abnormalities of gait and mobility: Secondary | ICD-10-CM | POA: Diagnosis not present

## 2023-10-14 DIAGNOSIS — M2559 Pain in other specified joint: Secondary | ICD-10-CM | POA: Diagnosis not present

## 2023-10-14 NOTE — Telephone Encounter (Signed)
Pharmacy Patient Advocate Encounter   Received notification that prior authorization for OLANZapine 2.5MG  tablets is required/requested.   Insurance verification completed.   The patient is insured through [employer] .   Per test claim: APPROVED from 10-14-2023 to 12-04-2024

## 2023-10-18 ENCOUNTER — Other Ambulatory Visit: Payer: Self-pay | Admitting: Internal Medicine

## 2023-10-18 DIAGNOSIS — M2559 Pain in other specified joint: Secondary | ICD-10-CM | POA: Diagnosis not present

## 2023-10-18 DIAGNOSIS — R2689 Other abnormalities of gait and mobility: Secondary | ICD-10-CM | POA: Diagnosis not present

## 2023-10-18 DIAGNOSIS — R2681 Unsteadiness on feet: Secondary | ICD-10-CM | POA: Diagnosis not present

## 2023-10-19 DIAGNOSIS — M2559 Pain in other specified joint: Secondary | ICD-10-CM | POA: Diagnosis not present

## 2023-10-19 DIAGNOSIS — R2681 Unsteadiness on feet: Secondary | ICD-10-CM | POA: Diagnosis not present

## 2023-10-19 DIAGNOSIS — R2689 Other abnormalities of gait and mobility: Secondary | ICD-10-CM | POA: Diagnosis not present

## 2023-10-26 DIAGNOSIS — R2681 Unsteadiness on feet: Secondary | ICD-10-CM | POA: Diagnosis not present

## 2023-10-26 DIAGNOSIS — R2689 Other abnormalities of gait and mobility: Secondary | ICD-10-CM | POA: Diagnosis not present

## 2023-10-26 DIAGNOSIS — M2559 Pain in other specified joint: Secondary | ICD-10-CM | POA: Diagnosis not present

## 2023-10-27 DIAGNOSIS — R2681 Unsteadiness on feet: Secondary | ICD-10-CM | POA: Diagnosis not present

## 2023-10-27 DIAGNOSIS — R2689 Other abnormalities of gait and mobility: Secondary | ICD-10-CM | POA: Diagnosis not present

## 2023-10-27 DIAGNOSIS — M2559 Pain in other specified joint: Secondary | ICD-10-CM | POA: Diagnosis not present

## 2023-11-01 DIAGNOSIS — R2681 Unsteadiness on feet: Secondary | ICD-10-CM | POA: Diagnosis not present

## 2023-11-01 DIAGNOSIS — M2559 Pain in other specified joint: Secondary | ICD-10-CM | POA: Diagnosis not present

## 2023-11-01 DIAGNOSIS — R2689 Other abnormalities of gait and mobility: Secondary | ICD-10-CM | POA: Diagnosis not present

## 2023-11-02 DIAGNOSIS — M2559 Pain in other specified joint: Secondary | ICD-10-CM | POA: Diagnosis not present

## 2023-11-02 DIAGNOSIS — R2689 Other abnormalities of gait and mobility: Secondary | ICD-10-CM | POA: Diagnosis not present

## 2023-11-02 DIAGNOSIS — R2681 Unsteadiness on feet: Secondary | ICD-10-CM | POA: Diagnosis not present

## 2023-11-07 DIAGNOSIS — Z23 Encounter for immunization: Secondary | ICD-10-CM | POA: Diagnosis not present

## 2023-11-11 DIAGNOSIS — M2559 Pain in other specified joint: Secondary | ICD-10-CM | POA: Diagnosis not present

## 2023-11-11 DIAGNOSIS — R2689 Other abnormalities of gait and mobility: Secondary | ICD-10-CM | POA: Diagnosis not present

## 2023-11-11 DIAGNOSIS — R2681 Unsteadiness on feet: Secondary | ICD-10-CM | POA: Diagnosis not present

## 2023-11-15 ENCOUNTER — Other Ambulatory Visit: Payer: Self-pay | Admitting: Internal Medicine

## 2023-11-20 ENCOUNTER — Encounter: Payer: Self-pay | Admitting: Internal Medicine

## 2023-11-20 NOTE — Progress Notes (Unsigned)
    Subjective:    Patient ID: Janet Steele, female    DOB: 1929-08-21, 89 y.o.   MRN: [id number]      HPI Janet Steele is here for No chief complaint on file.    Headaches -   Hx of headaches - 2021 was having left sided headaches - ? Cervicogenic in nature. Was on gabapentin.  Tylenol only - no aleve.    Saw Dr Lucia Gaskins - last 02/2020 - intractable episodic paroxysmal hemicrania - headache in L temple and top of head - off and on for years.  Stinging, lasts 10-20 min. Was on gabapentin 200 mg bid at one point.      Medications and allergies reviewed with patient and updated if appropriate.  Current Outpatient Medications on File Prior to Visit  Medication Sig Dispense Refill   albuterol (PROVENTIL) (2.5 MG/3ML) 0.083% nebulizer solution Take 3 mLs (2.5 mg total) by nebulization every 12 (twelve) hours as needed for wheezing or shortness of breath. 150 mL 3   ASPIRIN LOW DOSE 81 MG chewable tablet GIVE 1 TAB BY MOUTH ONCE DAILY 30 tablet 11   atorvastatin (LIPITOR) 80 MG tablet GIVE 1 TAB BY MOUTH ONCE DAILY F/U APPT DUE IN SEPT FOR FUTURE REFILLS 30 tablet 4   clopidogrel (PLAVIX) 75 MG tablet TAKE 1 TABLET (75 MG TOTAL) BY MOUTH DAILY. F/U APPT DUE IN SEPT FOR FUTURE REFILLS 30 tablet 11   donepezil (ARICEPT) 5 MG tablet Take 1 tablet (5 mg total) by mouth at bedtime. 30 tablet 11   DULoxetine (CYMBALTA) 60 MG capsule GIVE 1 CAP BY MOUTH ONCE DAILY F/U appt due in Sept for future refills 30 capsule 5   glimepiride (AMARYL) 1 MG tablet TAKE 1 TABLET (1 MG TOTAL) BY MOUTH DAILY WITH BREAKFAST. F/U APPT DUE IN SEPT FOR FUTURE REFILLS 30 tablet 11   guaiFENesin (MUCINEX) 600 MG 12 hr tablet Take 1 tablet (600 mg total) by mouth 2 (two) times daily. 180 tablet 3   levothyroxine (SYNTHROID) 100 MCG tablet Take 1 tablet (100 mcg total) by mouth daily before breakfast. F/U appt due in Sept for future refills 30 tablet 5   metoprolol tartrate (LOPRESSOR) 25 MG tablet TAKE 1 TAB BY MOUTH TWICE  DAILY 60 tablet 11   nitroGLYCERIN (NITROSTAT) 0.4 MG SL tablet TAKE 1 TAB SUBLINGUALLY EVERY 5 MINUTES FOR 3 DOSES AS NEEDED FOR CHEST PAIN. IF NO RELIEF CALL MD. 25 tablet 5   OLANZapine (ZYPREXA) 2.5 MG tablet Take 1 tablet daily at 4 pm 90 tablet 1   pantoprazole (PROTONIX) 40 MG tablet TAKE 1 TABLET (40 MG TOTAL) BY MOUTH 2 (TWO) TIMES DAILY. F/U APPT DUE IN SEPT FOR FUTURE REFILLS 60 tablet 11   No current facility-administered medications on file prior to visit.    Review of Systems     Objective:  There were no vitals filed for this visit. BP Readings from Last 3 Encounters:  09/27/23 116/78  07/12/23 124/60  05/30/23 110/68   Wt Readings from Last 3 Encounters:  07/12/23 114 lb 3.2 oz (51.8 kg)  02/02/23 106 lb 9.6 oz (48.4 kg)  10/04/22 100 lb (45.4 kg)   There is no height or weight on file to calculate BMI.    Physical Exam         Assessment & Plan:    See Problem List for Assessment and Plan of chronic medical problems.

## 2023-11-21 ENCOUNTER — Ambulatory Visit (INDEPENDENT_AMBULATORY_CARE_PROVIDER_SITE_OTHER): Payer: Medicare Other | Admitting: Internal Medicine

## 2023-11-21 VITALS — BP 124/70 | HR 58 | Temp 97.9°F | Ht 60.0 in

## 2023-11-21 DIAGNOSIS — E782 Mixed hyperlipidemia: Secondary | ICD-10-CM

## 2023-11-21 DIAGNOSIS — E039 Hypothyroidism, unspecified: Secondary | ICD-10-CM

## 2023-11-21 DIAGNOSIS — F03918 Unspecified dementia, unspecified severity, with other behavioral disturbance: Secondary | ICD-10-CM

## 2023-11-21 DIAGNOSIS — E1165 Type 2 diabetes mellitus with hyperglycemia: Secondary | ICD-10-CM

## 2023-11-21 DIAGNOSIS — Z7984 Long term (current) use of oral hypoglycemic drugs: Secondary | ICD-10-CM

## 2023-11-21 DIAGNOSIS — G4486 Cervicogenic headache: Secondary | ICD-10-CM

## 2023-11-21 DIAGNOSIS — I10 Essential (primary) hypertension: Secondary | ICD-10-CM

## 2023-11-21 DIAGNOSIS — G8929 Other chronic pain: Secondary | ICD-10-CM

## 2023-11-21 DIAGNOSIS — M545 Low back pain, unspecified: Secondary | ICD-10-CM | POA: Diagnosis not present

## 2023-11-21 DIAGNOSIS — N1832 Chronic kidney disease, stage 3b: Secondary | ICD-10-CM | POA: Diagnosis not present

## 2023-11-21 LAB — COMPREHENSIVE METABOLIC PANEL
ALT: 13 U/L (ref 0–35)
AST: 17 U/L (ref 0–37)
Albumin: 4.1 g/dL (ref 3.5–5.2)
Alkaline Phosphatase: 113 U/L (ref 39–117)
BUN: 27 mg/dL — ABNORMAL HIGH (ref 6–23)
CO2: 24 meq/L (ref 19–32)
Calcium: 8.6 mg/dL (ref 8.4–10.5)
Chloride: 104 meq/L (ref 96–112)
Creatinine, Ser: 1.73 mg/dL — ABNORMAL HIGH (ref 0.40–1.20)
GFR: 25.41 mL/min — ABNORMAL LOW (ref >60.00)
Glucose, Bld: 274 mg/dL — ABNORMAL HIGH (ref 70–99)
Potassium: 4.9 meq/L (ref 3.5–5.1)
Sodium: 138 meq/L (ref 135–145)
Total Bilirubin: 1 mg/dL (ref 0.2–1.2)
Total Protein: 7 g/dL (ref 6.0–8.3)

## 2023-11-21 LAB — CBC WITH DIFFERENTIAL/PLATELET
Basophils Absolute: 0 10*3/uL (ref 0.0–0.1)
Basophils Relative: 0.6 % (ref 0.0–3.0)
Eosinophils Absolute: 0.2 10*3/uL (ref 0.0–0.7)
Eosinophils Relative: 2.9 % (ref 0.0–5.0)
HCT: 36.3 % (ref 36.0–46.0)
Hemoglobin: 12 g/dL (ref 12.0–15.0)
Lymphocytes Relative: 16.8 % (ref 12.0–46.0)
Lymphs Abs: 1.2 10*3/uL (ref 0.7–4.0)
MCHC: 33 g/dL (ref 30.0–36.0)
MCV: 90 fL (ref 78.0–100.0)
Monocytes Absolute: 0.4 10*3/uL (ref 0.1–1.0)
Monocytes Relative: 5.4 % (ref 3.0–12.0)
Neutro Abs: 5.5 10*3/uL (ref 1.4–7.7)
Neutrophils Relative %: 74.3 % (ref 43.0–77.0)
Platelets: 234 10*3/uL (ref 150.0–400.0)
RBC: 4.03 Mil/uL (ref 3.87–5.11)
RDW: 14.3 % (ref 11.5–15.5)
WBC: 7.3 10*3/uL (ref 4.0–10.5)

## 2023-11-21 LAB — LIPID PANEL
Cholesterol: 192 mg/dL (ref 0–200)
HDL: 42.6 mg/dL (ref >39.00)
LDL Cholesterol: 111 mg/dL — ABNORMAL HIGH (ref 0–99)
NonHDL: 149.61
Total CHOL/HDL Ratio: 5
Triglycerides: 195 mg/dL — ABNORMAL HIGH (ref 0.0–149.0)
VLDL: 39 mg/dL (ref 0.0–40.0)

## 2023-11-21 LAB — TSH: TSH: 27.21 u[IU]/mL — ABNORMAL HIGH (ref 0.35–5.50)

## 2023-11-21 LAB — HEMOGLOBIN A1C: Hgb A1c MFr Bld: 7.6 % — ABNORMAL HIGH (ref 4.6–6.5)

## 2023-11-21 MED ORDER — OLANZAPINE 5 MG PO TABS: ORAL_TABLET | ORAL | 5 refills | Status: DC

## 2023-11-21 NOTE — Patient Instructions (Addendum)
      Blood work was ordered.       Medications changes include :   increase zyprexa 5 mg  - take at 4pm - can adjust time it is given        Return in about 6 months (around 05/21/2024) for follow up.

## 2023-11-21 NOTE — Assessment & Plan Note (Addendum)
Chronic BP controlled Continue metoprolol 25 mg bid CMP

## 2023-11-21 NOTE — Assessment & Plan Note (Signed)
Chronic Intermittent Has seen neurology in the past Was on gabapentin 200 mg twice daily at 1 point Because of the intermittent nature of her headaches this medication was stopped She has had some increase in headaches Discussed with her son possibly restarting the gabapentin, but he has not heard her complain that much and would like to hold off because of possible side effects Discussed the etiology of the headaches Can consider low-dose gabapentin if they increase or persist Can take Tylenol as needed

## 2023-11-21 NOTE — Assessment & Plan Note (Signed)
Chronic Check lipid panel  Continue atorvastatin 80 mg daily 

## 2023-11-21 NOTE — Assessment & Plan Note (Signed)
Chronic stable CMP, CBc

## 2023-11-21 NOTE — Assessment & Plan Note (Signed)
Chronic She has gained some weight-her son states she is eating more Continue levothyroxine 100 mcg daily Check TSH

## 2023-11-21 NOTE — Assessment & Plan Note (Signed)
Chronic Has had back pain chronically and she does complain of some back pain This could be affecting her desire and ability to walk She is also having some left leg pain and has her chronic right leg weakness from her stroke Discussed possible orthopedic evaluation At this point she is willing to do some physical therapy which she has not been willing to do to help maintain her strength and independence If her son continues to see difficulty walking he will let me know so we can evaluate further Unsure of her not walking is partially pain related and partially desirable related

## 2023-11-21 NOTE — Assessment & Plan Note (Signed)
Chronic increased sundowning in the afternoon/evening-gets angry and snaps at the staff and her husband Intermittent confusion Zyprexa 2.5 mg in the late afternoon has helped, but her agitation is increased Will try increasing this to 5 mg daily Advised them to monitor for dizziness, drowsiness Can change time that this is given to her Is not effective can try different medication

## 2023-11-21 NOTE — Assessment & Plan Note (Signed)
Chronic Check A1c Continue glimepiride 1 mg every morning

## 2023-11-24 ENCOUNTER — Telehealth: Payer: Self-pay | Admitting: Internal Medicine

## 2023-11-24 DIAGNOSIS — R2681 Unsteadiness on feet: Secondary | ICD-10-CM | POA: Diagnosis not present

## 2023-11-24 DIAGNOSIS — R2689 Other abnormalities of gait and mobility: Secondary | ICD-10-CM | POA: Diagnosis not present

## 2023-11-24 DIAGNOSIS — M2559 Pain in other specified joint: Secondary | ICD-10-CM | POA: Diagnosis not present

## 2023-11-24 NOTE — Telephone Encounter (Signed)
Copied from CRM [id number]. Topic: Clinical - Prescription Issue >> Nov 24, 2023  4:32 PM Elizebeth Brooking wrote: Reason for CRM: Patient son called in stating that his mother medication for thyroids and cholesterol does need to be changed he is requesting a callback

## 2023-11-24 NOTE — Telephone Encounter (Signed)
Pt son called Mr. Janet Steele wanting to speak with a nurse about pt medication. Mr Janet Steele was being very rude and yelling at the [facility] rep and would not give too much info. Please advise.

## 2023-11-25 MED ORDER — LEVOTHYROXINE SODIUM 112 MCG PO TABS: 112.0000 ug | ORAL_TABLET | Freq: Every day | ORAL | 3 refills | Status: DC

## 2023-11-25 NOTE — Addendum Note (Signed)
Addended by: Pincus Sanes on: 11/25/2023 05:43 PM   Modules accepted: Orders

## 2023-11-25 NOTE — Telephone Encounter (Signed)
Spoke with patient's son today.

## 2023-12-01 DIAGNOSIS — M2559 Pain in other specified joint: Secondary | ICD-10-CM | POA: Diagnosis not present

## 2023-12-01 DIAGNOSIS — R2689 Other abnormalities of gait and mobility: Secondary | ICD-10-CM | POA: Diagnosis not present

## 2023-12-01 DIAGNOSIS — R2681 Unsteadiness on feet: Secondary | ICD-10-CM | POA: Diagnosis not present

## 2023-12-03 DIAGNOSIS — R2689 Other abnormalities of gait and mobility: Secondary | ICD-10-CM | POA: Diagnosis not present

## 2023-12-03 DIAGNOSIS — M2559 Pain in other specified joint: Secondary | ICD-10-CM | POA: Diagnosis not present

## 2023-12-03 DIAGNOSIS — R2681 Unsteadiness on feet: Secondary | ICD-10-CM | POA: Diagnosis not present

## 2023-12-06 ENCOUNTER — Other Ambulatory Visit: Payer: Self-pay | Admitting: Internal Medicine

## 2023-12-08 ENCOUNTER — Ambulatory Visit (INDEPENDENT_AMBULATORY_CARE_PROVIDER_SITE_OTHER): Payer: Self-pay

## 2023-12-08 DIAGNOSIS — I495 Sick sinus syndrome: Secondary | ICD-10-CM | POA: Diagnosis not present

## 2023-12-08 DIAGNOSIS — R2681 Unsteadiness on feet: Secondary | ICD-10-CM | POA: Diagnosis not present

## 2023-12-08 DIAGNOSIS — R2689 Other abnormalities of gait and mobility: Secondary | ICD-10-CM | POA: Diagnosis not present

## 2023-12-08 DIAGNOSIS — M2559 Pain in other specified joint: Secondary | ICD-10-CM | POA: Diagnosis not present

## 2023-12-08 LAB — CUP PACEART REMOTE DEVICE CHECK
Battery Impedance: 2393 Ohm
Battery Remaining Longevity: 28 mo
Battery Voltage: 2.73 V
Brady Statistic AP VP Percent: 6 %
Brady Statistic AP VS Percent: 94 %
Brady Statistic AS VP Percent: 0 %
Brady Statistic AS VS Percent: 0 %
Date Time Interrogation Session: 20250101142207
Implantable Lead Connection Status: 753985
Implantable Lead Connection Status: 753985
Implantable Lead Implant Date: 20130801
Implantable Lead Implant Date: 20130801
Implantable Lead Location: 753859
Implantable Lead Location: 753860
Implantable Lead Model: 5076
Implantable Lead Model: 5092
Implantable Pulse Generator Implant Date: 20130801
Lead Channel Impedance Value: 516 Ohm
Lead Channel Impedance Value: 565 Ohm
Lead Channel Pacing Threshold Amplitude: 1.125 V
Lead Channel Pacing Threshold Amplitude: 1.75 V
Lead Channel Pacing Threshold Pulse Width: 0.4 ms
Lead Channel Pacing Threshold Pulse Width: 0.4 ms
Lead Channel Setting Pacing Amplitude: 2.25 V
Lead Channel Setting Pacing Amplitude: 3.5 V
Lead Channel Setting Pacing Pulse Width: 0.4 ms
Lead Channel Setting Sensing Sensitivity: 5.6 mV
Zone Setting Status: 755011
Zone Setting Status: 755011

## 2023-12-09 DIAGNOSIS — R2681 Unsteadiness on feet: Secondary | ICD-10-CM | POA: Diagnosis not present

## 2023-12-09 DIAGNOSIS — R2689 Other abnormalities of gait and mobility: Secondary | ICD-10-CM | POA: Diagnosis not present

## 2023-12-09 DIAGNOSIS — M2559 Pain in other specified joint: Secondary | ICD-10-CM | POA: Diagnosis not present

## 2023-12-13 DIAGNOSIS — M2559 Pain in other specified joint: Secondary | ICD-10-CM | POA: Diagnosis not present

## 2023-12-13 DIAGNOSIS — R2689 Other abnormalities of gait and mobility: Secondary | ICD-10-CM | POA: Diagnosis not present

## 2023-12-13 DIAGNOSIS — R2681 Unsteadiness on feet: Secondary | ICD-10-CM | POA: Diagnosis not present

## 2023-12-20 DIAGNOSIS — R2681 Unsteadiness on feet: Secondary | ICD-10-CM | POA: Diagnosis not present

## 2023-12-20 DIAGNOSIS — R2689 Other abnormalities of gait and mobility: Secondary | ICD-10-CM | POA: Diagnosis not present

## 2023-12-20 DIAGNOSIS — M2559 Pain in other specified joint: Secondary | ICD-10-CM | POA: Diagnosis not present

## 2023-12-23 DIAGNOSIS — R2689 Other abnormalities of gait and mobility: Secondary | ICD-10-CM | POA: Diagnosis not present

## 2023-12-23 DIAGNOSIS — R2681 Unsteadiness on feet: Secondary | ICD-10-CM | POA: Diagnosis not present

## 2023-12-23 DIAGNOSIS — M2559 Pain in other specified joint: Secondary | ICD-10-CM | POA: Diagnosis not present

## 2024-01-03 ENCOUNTER — Other Ambulatory Visit: Payer: Self-pay | Admitting: Internal Medicine

## 2024-01-04 ENCOUNTER — Telehealth: Payer: Self-pay

## 2024-01-04 NOTE — Telephone Encounter (Signed)
Copied from CRM [id number]. Topic: Clinical - Prescription Issue >> Jan 04, 2024  1:03 PM Isabell A wrote: Reason for CRM: Chrissie Noa (son) calling to speak with Albin Felling in regard to switching prescription Wixela, states it wont be covered by her insurance.   Callback number: [phone]

## 2024-01-08 MED ORDER — SYMBICORT 80-4.5 MCG/ACT IN AERO: 2.0000 | INHALATION_SPRAY | Freq: Two times a day (BID) | RESPIRATORY_TRACT | 5 refills | Status: DC

## 2024-01-08 NOTE — Telephone Encounter (Signed)
Symbicort sent.

## 2024-01-08 NOTE — Addendum Note (Signed)
Addended by: Pincus Sanes on: 01/08/2024 10:24 AM   Modules accepted: Orders

## 2024-01-11 ENCOUNTER — Telehealth: Payer: Self-pay | Admitting: Internal Medicine

## 2024-01-11 NOTE — Telephone Encounter (Signed)
 Copied from CRM [id number]. Topic: Clinical - Prescription Issue >> Jan 11, 2024  3:01 PM Pinkey ORN wrote: Reason for CRM: Wixela (Inhaler) >> Jan 11, 2024  3:04 PM Pinkey ORN wrote: [facility] [phone] Called on behalf of the patient, states they sent over a DC Request Form Requesting a call back from [facility]

## 2024-01-12 NOTE — Telephone Encounter (Signed)
 Form received and faxed back today with copy of updated med list attached.

## 2024-01-17 DIAGNOSIS — R2689 Other abnormalities of gait and mobility: Secondary | ICD-10-CM | POA: Diagnosis not present

## 2024-01-17 DIAGNOSIS — R2681 Unsteadiness on feet: Secondary | ICD-10-CM | POA: Diagnosis not present

## 2024-01-17 DIAGNOSIS — M2559 Pain in other specified joint: Secondary | ICD-10-CM | POA: Diagnosis not present

## 2024-01-18 NOTE — Progress Notes (Signed)
Remote pacemaker transmission.

## 2024-01-31 ENCOUNTER — Other Ambulatory Visit: Payer: Self-pay | Admitting: Internal Medicine

## 2024-02-21 ENCOUNTER — Ambulatory Visit: Admitting: Internal Medicine

## 2024-03-19 ENCOUNTER — Ambulatory Visit (INDEPENDENT_AMBULATORY_CARE_PROVIDER_SITE_OTHER): Payer: Self-pay

## 2024-03-19 DIAGNOSIS — I495 Sick sinus syndrome: Secondary | ICD-10-CM | POA: Diagnosis not present

## 2024-03-20 ENCOUNTER — Other Ambulatory Visit: Payer: Self-pay | Admitting: Internal Medicine

## 2024-03-20 DIAGNOSIS — I25700 Atherosclerosis of coronary artery bypass graft(s), unspecified, with unstable angina pectoris: Secondary | ICD-10-CM

## 2024-03-20 DIAGNOSIS — I200 Unstable angina: Secondary | ICD-10-CM

## 2024-03-20 LAB — CUP PACEART REMOTE DEVICE CHECK
Battery Impedance: 2326 Ohm
Battery Remaining Longevity: 27 mo
Battery Voltage: 2.74 V
Brady Statistic AP VP Percent: 7 %
Brady Statistic AP VS Percent: 92 %
Brady Statistic AS VP Percent: 0 %
Brady Statistic AS VS Percent: 1 %
Date Time Interrogation Session: 20250414164200
Implantable Lead Connection Status: 753985
Implantable Lead Connection Status: 753985
Implantable Lead Implant Date: 20130801
Implantable Lead Implant Date: 20130801
Implantable Lead Location: 753859
Implantable Lead Location: 753860
Implantable Lead Model: 5076
Implantable Lead Model: 5092
Implantable Pulse Generator Implant Date: 20130801
Lead Channel Impedance Value: 498 Ohm
Lead Channel Impedance Value: 553 Ohm
Lead Channel Pacing Threshold Amplitude: 1.25 V
Lead Channel Pacing Threshold Amplitude: 1.375 V
Lead Channel Pacing Threshold Pulse Width: 0.4 ms
Lead Channel Pacing Threshold Pulse Width: 0.4 ms
Lead Channel Setting Pacing Amplitude: 2.5 V
Lead Channel Setting Pacing Amplitude: 2.75 V
Lead Channel Setting Pacing Pulse Width: 0.4 ms
Lead Channel Setting Sensing Sensitivity: 5.6 mV
Zone Setting Status: 755011
Zone Setting Status: 755011

## 2024-03-21 DIAGNOSIS — L821 Other seborrheic keratosis: Secondary | ICD-10-CM | POA: Diagnosis not present

## 2024-03-21 DIAGNOSIS — L570 Actinic keratosis: Secondary | ICD-10-CM | POA: Diagnosis not present

## 2024-03-21 DIAGNOSIS — Z85828 Personal history of other malignant neoplasm of skin: Secondary | ICD-10-CM | POA: Diagnosis not present

## 2024-03-27 ENCOUNTER — Other Ambulatory Visit: Payer: Self-pay | Admitting: Internal Medicine

## 2024-04-03 ENCOUNTER — Encounter: Payer: Self-pay | Admitting: Cardiovascular Disease

## 2024-04-03 DIAGNOSIS — M19071 Primary osteoarthritis, right ankle and foot: Secondary | ICD-10-CM | POA: Diagnosis not present

## 2024-04-03 DIAGNOSIS — B351 Tinea unguium: Secondary | ICD-10-CM | POA: Diagnosis not present

## 2024-04-03 DIAGNOSIS — M79675 Pain in left toe(s): Secondary | ICD-10-CM | POA: Diagnosis not present

## 2024-04-03 DIAGNOSIS — M2011 Hallux valgus (acquired), right foot: Secondary | ICD-10-CM | POA: Diagnosis not present

## 2024-04-10 ENCOUNTER — Other Ambulatory Visit: Payer: Self-pay | Admitting: Internal Medicine

## 2024-04-16 ENCOUNTER — Ambulatory Visit: Admitting: Internal Medicine

## 2024-04-27 ENCOUNTER — Ambulatory Visit: Payer: Self-pay

## 2024-04-27 ENCOUNTER — Emergency Department (HOSPITAL_COMMUNITY)

## 2024-04-27 ENCOUNTER — Encounter (HOSPITAL_COMMUNITY): Payer: Self-pay

## 2024-04-27 ENCOUNTER — Emergency Department (HOSPITAL_COMMUNITY)
Admission: EM | Admit: 2024-04-27 | Discharge: 2024-04-27 | Disposition: A | Discharge status: Home / Self Care | Attending: Emergency Medicine | Admitting: Emergency Medicine

## 2024-04-27 DIAGNOSIS — Z7902 Long term (current) use of antithrombotics/antiplatelets: Secondary | ICD-10-CM | POA: Diagnosis not present

## 2024-04-27 DIAGNOSIS — R42 Dizziness and giddiness: Secondary | ICD-10-CM

## 2024-04-27 DIAGNOSIS — E860 Dehydration: Secondary | ICD-10-CM

## 2024-04-27 DIAGNOSIS — Z950 Presence of cardiac pacemaker: Secondary | ICD-10-CM | POA: Diagnosis not present

## 2024-04-27 DIAGNOSIS — R112 Nausea with vomiting, unspecified: Secondary | ICD-10-CM | POA: Diagnosis not present

## 2024-04-27 DIAGNOSIS — R001 Bradycardia, unspecified: Secondary | ICD-10-CM | POA: Diagnosis not present

## 2024-04-27 DIAGNOSIS — H538 Other visual disturbances: Secondary | ICD-10-CM | POA: Diagnosis not present

## 2024-04-27 LAB — CBC WITH DIFFERENTIAL/PLATELET
Abs Immature Granulocytes: 0.03 10*3/uL (ref 0.00–0.07)
Basophils Absolute: 0 10*3/uL (ref 0.0–0.1)
Basophils Relative: 0 %
Eosinophils Absolute: 0.1 10*3/uL (ref 0.0–0.5)
Eosinophils Relative: 2 %
HCT: 34.4 % — ABNORMAL LOW (ref 36.0–46.0)
Hemoglobin: 10.5 g/dL — ABNORMAL LOW (ref 12.0–15.0)
Immature Granulocytes: 0 %
Lymphocytes Relative: 15 %
Lymphs Abs: 1.1 10*3/uL (ref 0.7–4.0)
MCH: 27.6 pg (ref 26.0–34.0)
MCHC: 30.5 g/dL (ref 30.0–36.0)
MCV: 90.3 fL (ref 80.0–100.0)
Monocytes Absolute: 0.4 10*3/uL (ref 0.1–1.0)
Monocytes Relative: 6 %
Neutro Abs: 5.4 10*3/uL (ref 1.7–7.7)
Neutrophils Relative %: 77 %
Platelets: 282 10*3/uL (ref 150–400)
RBC: 3.81 MIL/uL — ABNORMAL LOW (ref 3.87–5.11)
RDW: 14.6 % (ref 11.5–15.5)
WBC: 7.1 10*3/uL (ref 4.0–10.5)
nRBC: 0 % (ref 0.0–0.2)

## 2024-04-27 LAB — COMPREHENSIVE METABOLIC PANEL WITH GFR
ALT: 15 U/L (ref 0–44)
AST: 25 U/L (ref 15–41)
Albumin: 3.7 g/dL (ref 3.5–5.0)
Alkaline Phosphatase: 90 U/L (ref 38–126)
Anion gap: 13 (ref 5–15)
BUN: 25 mg/dL — ABNORMAL HIGH (ref 8–23)
CO2: 28 mmol/L (ref 22–32)
Calcium: 9.9 mg/dL (ref 8.9–10.3)
Chloride: 95 mmol/L — ABNORMAL LOW (ref 98–111)
Creatinine, Ser: 2.3 mg/dL — ABNORMAL HIGH (ref 0.44–1.00)
GFR, Estimated: 19 mL/min — ABNORMAL LOW (ref >60)
Glucose, Bld: 220 mg/dL — ABNORMAL HIGH (ref 70–99)
Potassium: 4.6 mmol/L (ref 3.5–5.1)
Sodium: 136 mmol/L (ref 135–145)
Total Bilirubin: 1 mg/dL (ref 0.0–1.2)
Total Protein: 6.9 g/dL (ref 6.5–8.1)

## 2024-04-27 LAB — RESP PANEL BY RT-PCR (RSV, FLU A&B, COVID)  RVPGX2
Influenza A by PCR: NEGATIVE
Influenza B by PCR: NEGATIVE
Resp Syncytial Virus by PCR: NEGATIVE
SARS Coronavirus 2 by RT PCR: NEGATIVE

## 2024-04-27 LAB — TROPONIN I (HIGH SENSITIVITY): Troponin I (High Sensitivity): 12 ng/L (ref <18)

## 2024-04-27 LAB — LIPASE, BLOOD: Lipase: 63 U/L — ABNORMAL HIGH (ref 11–51)

## 2024-04-27 MED ORDER — DIPHENHYDRAMINE HCL 50 MG/ML IJ SOLN
12.5000 mg | Freq: Once | INTRAMUSCULAR | Status: AC
  Administered 2024-04-27: 12.5 mg via INTRAVENOUS
  Filled 2024-04-27: qty 1

## 2024-04-27 MED ORDER — SODIUM CHLORIDE 0.9 % IV BOLUS
1000.0000 mL | Freq: Once | INTRAVENOUS | Status: AC
  Administered 2024-04-27: 1000 mL via INTRAVENOUS

## 2024-04-27 MED ORDER — PROCHLORPERAZINE EDISYLATE 10 MG/2ML IJ SOLN
5.0000 mg | Freq: Once | INTRAMUSCULAR | Status: AC
  Administered 2024-04-27: 5 mg via INTRAVENOUS
  Filled 2024-04-27: qty 2

## 2024-04-27 MED ORDER — MECLIZINE HCL 25 MG PO TABS
25.0000 mg | ORAL_TABLET | Freq: Three times a day (TID) | ORAL | 0 refills | Status: AC | PRN
Start: 2024-04-27 — End: ?

## 2024-04-27 MED ORDER — PROMETHAZINE HCL 25 MG PO TABS: 25.0000 mg | ORAL_TABLET | Freq: Four times a day (QID) | ORAL | 0 refills | Status: AC | PRN

## 2024-04-27 MED ORDER — MECLIZINE HCL 25 MG PO TABS
25.0000 mg | ORAL_TABLET | Freq: Once | ORAL | Status: AC
Start: 2024-04-27 — End: 2024-04-27
  Administered 2024-04-27: 25 mg via ORAL
  Filled 2024-04-27: qty 1

## 2024-04-27 NOTE — ED Provider Notes (Signed)
 [place] [facility]ERGENCY DEPARTMENT AT [facility] Provider Note   CSN: [id number] Arrival date & time: 04/27/24  1449     History  Chief Complaint  Patient presents with   Dizziness   Nausea   Emesis    Janet Steele is a 89 y.o. female.  89 yo F with a chief complaints of dizziness.  This been going on she said for about 3 days now.  Worse with head movement and better with holding her head still.  Has had dizziness in the past but this time feels like it is worse.  Has had some nausea and vomiting with it.  She is also been coughing and congested.  Has some chest discomfort.  Denies abdominal pain denies known fevers.  Feels like her vision maybe is also changed over the past 24 to 48 hours.   Dizziness Associated symptoms: vomiting   Emesis      Home Medications Prior to Admission medications   Medication Sig Start Date End Date Taking? Authorizing Provider  meclizine (ANTIVERT) 25 MG tablet Take 1 tablet (25 mg total) by mouth 3 (three) times daily as needed for dizziness. 04/27/24  Yes Albertus Hughs, DO  promethazine (PHENERGAN) 25 MG tablet Take 1 tablet (25 mg total) by mouth every 6 (six) hours as needed for nausea or vomiting. 04/27/24  Yes Albertus Hughs, DO  albuterol  (PROVENTIL ) (2.5 MG/3ML) 0.083% nebulizer solution Take 3 mLs (2.5 mg total) by nebulization every 12 (twelve) hours as needed for wheezing or shortness of breath. 09/27/23   Colene Dauphin, MD  ASPIRIN  LOW DOSE 81 MG chewable tablet GIVE 1 TAB BY MOUTH ONCE DAILY 10/04/23   Burns, Beckey Bourgeois, MD  atorvastatin  (LIPITOR ) 80 MG tablet GIVE 1 TAB BY MOUTH ONCE DAILY F/U APPT DUE IN SEPT FOR FUTURE REFILLS 04/10/24   Colene Dauphin, MD  clopidogrel  (PLAVIX ) 75 MG tablet TAKE 1 TABLET (75 MG TOTAL) BY MOUTH DAILY. F/U APPT DUE IN SEPT FOR FUTURE REFILLS 10/04/23   Colene Dauphin, MD  donepezil  (ARICEPT ) 5 MG tablet Take 1 tablet (5 mg total) by mouth at bedtime. 11/15/23   Burns, Beckey Bourgeois, MD  DULoxetine   (CYMBALTA ) 60 MG capsule GIVE 1 CAP BY MOUTH ONCE DAILY F/U APPT DUE IN SEPT FOR FUTURE REFILLS 01/03/24   Colene Dauphin, MD  glimepiride  (AMARYL ) 1 MG tablet TAKE 1 TABLET (1 MG TOTAL) BY MOUTH DAILY WITH BREAKFAST. F/U APPT DUE IN SEPT FOR FUTURE REFILLS 10/04/23   Colene Dauphin, MD  guaiFENesin  (MUCINEX ) 600 MG 12 hr tablet Take 1 tablet (600 mg total) by mouth 2 (two) times daily. 09/30/23   Colene Dauphin, MD  levothyroxine  (SYNTHROID ) 112 MCG tablet Take 1 tablet (112 mcg total) by mouth daily. 11/25/23   Colene Dauphin, MD  metoprolol  tartrate (LOPRESSOR ) 25 MG tablet TAKE 1 TAB BY MOUTH TWICE DAILY 03/20/24   Burns, Beckey Bourgeois, MD  nitroGLYCERIN  (NITROSTAT ) 0.4 MG SL tablet TAKE 1 TAB SUBLINGUALLY EVERY 5 MINUTES FOR 3 DOSES AS NEEDED FOR CHEST PAIN. IF NO RELIEF CALL MD. 04/26/23   Colene Dauphin, MD  OLANZapine  (ZYPREXA ) 5 MG tablet TAKE DAILY AT 4PM 03/27/24   Colene Dauphin, MD  ondansetron  (ZOFRAN -ODT) 4 MG disintegrating tablet TAKE 1 TAB BY MOUTH EVERY 8 HOURS AS NEEDED FOR NAUSEA 01/31/24   Colene Dauphin, MD  pantoprazole  (PROTONIX ) 40 MG tablet TAKE 1 TAB (40MG ) BY MOUTH ONCE DAILY 12/06/23   Burns,  Beckey Bourgeois, MD  SYMBICORT  80-4.5 MCG/ACT inhaler Inhale 2 puffs into the lungs 2 (two) times daily. 01/08/24   Colene Dauphin, MD      Allergies    Morphine, Penicillins, Prednisone , Sulfonamide derivatives, Codeine, Hydrocodone -acetaminophen , Meperidine hcl, Metoclopramide hcl, Metoprolol  succinate, Moxifloxacin, Oxycodone -aspirin , Pentazocine lactate, Pioglitazone, and Trazodone  and nefazodone    Review of Systems   Review of Systems  Gastrointestinal:  Positive for vomiting.  Neurological:  Positive for dizziness.    Physical Exam Updated Vital Signs BP 138/71 (BP Location: Right Arm)   Pulse 76   Temp 98.2 F (36.8 C) (Oral)   Resp 18   Ht 5' (1.524 m)   Wt 52.2 kg   SpO2 96%   BMI 22.46 kg/m  Physical Exam Vitals and nursing note reviewed.  Constitutional:      General: She  is not in acute distress.    Appearance: She is well-developed. She is not diaphoretic.  HENT:     Head: Normocephalic and atraumatic.  Eyes:     Pupils: Pupils are equal, round, and reactive to light.  Cardiovascular:     Rate and Rhythm: Normal rate and regular rhythm.     Heart sounds: No murmur heard.    No friction rub. No gallop.  Pulmonary:     Effort: Pulmonary effort is normal.     Breath sounds: No wheezing or rales.  Abdominal:     General: There is no distension.     Palpations: Abdomen is soft.     Tenderness: There is no abdominal tenderness.  Musculoskeletal:        General: No tenderness.     Cervical back: Normal range of motion and neck supple.  Skin:    General: Skin is warm and dry.  Neurological:     Mental Status: She is alert and oriented to person, place, and time.     GCS: GCS eye subscore is 4. GCS verbal subscore is 5. GCS motor subscore is 6.     Cranial Nerves: Cranial nerves 2-12 are intact.     Sensory: Sensation is intact.     Motor: Motor function is intact.     Coordination: Coordination is intact.     Comments: Patient is quite hard of hearing which somewhat limits exam but no obvious neurologic deficit.  No obvious nystagmus.  She is able to sit up and hold herself up without any obvious truncal ataxia.  Psychiatric:        Behavior: Behavior normal.     ED Results / Procedures / Treatments   Labs (all labs ordered are listed, but only abnormal results are displayed) Labs Reviewed  CBC WITH DIFFERENTIAL/PLATELET - Abnormal; Notable for the following components:      Result Value   RBC 3.81 (*)    Hemoglobin 10.5 (*)    HCT 34.4 (*)    All other components within normal limits  COMPREHENSIVE METABOLIC PANEL WITH GFR - Abnormal; Notable for the following components:   Chloride 95 (*)    Glucose, Bld 220 (*)    BUN 25 (*)    Creatinine, Ser 2.30 (*)    GFR, Estimated 19 (*)    All other components within normal limits  LIPASE, BLOOD  - Abnormal; Notable for the following components:   Lipase 63 (*)    All other components within normal limits  RESP PANEL BY RT-PCR (RSV, FLU A&B, COVID)  RVPGX2  URINALYSIS, ROUTINE W REFLEX MICROSCOPIC  TROPONIN I (  HIGH SENSITIVITY)    EKG EKG Interpretation Date/Time:  Friday Apr 27 2024 15:35:06 EDT Ventricular Rate:  62 PR Interval:  199 QRS Duration:  108 QT Interval:  428 QTC Calculation: 435 R Axis:   85  Text Interpretation: Sinus rhythm Inferior infarct, age indeterminate No significant change since last tracing Confirmed by Albertus Hughs [phone]) on 04/27/2024 6:08:40 PM  Radiology DG Chest Port 1 View Result Date: 04/27/2024 CLINICAL DATA:  One day history of nausea, vomiting, and dizziness EXAM: PORTABLE CHEST 1 VIEW COMPARISON:  Chest radiograph dated 08/26/2021 FINDINGS: Lines/tubes: Left chest wall pacemaker leads project over the right atrium and ventricle. Lungs: Well inflated lungs. No focal consolidation. Pleura: No pneumothorax or pleural effusion. Heart/mediastinum: The heart size and mediastinal contours are within normal limits. Bones: Median sternotomy wires are nondisplaced. IMPRESSION: No acute disease. Electronically Signed   By: Limin  Xu M.D.   On: 04/27/2024 18:22    Procedures Procedures    Medications Ordered in ED Medications  sodium chloride  0.9 % bolus 1,000 mL (0 mLs Intravenous Stopped 04/27/24 1723)  prochlorperazine (COMPAZINE) injection 5 mg (5 mg Intravenous Given 04/27/24 1605)  diphenhydrAMINE  (BENADRYL ) injection 12.5 mg (12.5 mg Intravenous Given 04/27/24 1605)  meclizine (ANTIVERT) tablet 25 mg (25 mg Oral Given 04/27/24 1608)  prochlorperazine (COMPAZINE) injection 5 mg (5 mg Intravenous Given 04/27/24 1813)    ED Course/ Medical Decision Making/ A&P                                 Medical Decision Making Amount and/or Complexity of Data Reviewed Labs: ordered. Radiology: ordered. ECG/medicine tests: ordered.  Risk Prescription  drug management.   89 yo F with a chief complaints of dizziness.  Going on for a few days.  Patient has had episodes of dizziness in the past but feels like this time it is worse and making her have nausea and vomiting.  She tells me that she has not walked in 3 years.  Will obtain a laboratory evaluation.  Bolus of IV fluids.  Headache cocktail meclizine reassess.  Patient had some recurrence of her nausea and received a second dose of antiemetic.  Plain film of the chest independently interpreted by me without focal infiltrate.  No acute anemia.  Patient does have an acute kidney injury.  On repeat assessment patient is feeling better.  Her dizziness has resolved.  Her nausea has resolved.  She was describing some blurry vision going on for the past couple days which also has significantly improved but not quite resolved.  I did offer her MRI and a discussion with hospitalist for admission for dehydration.  Patient is currently declining and would like to go home.  I encouraged her to return anytime she changed her mind or if her symptoms worsened.  6:51 PM:  I have discussed the diagnosis/risks/treatment options with the patient.  Evaluation and diagnostic testing in the emergency department does not suggest an emergent condition requiring admission or immediate intervention beyond what has been performed at this time.  They will follow up with PCP. We also discussed returning to the ED immediately if new or worsening sx occur. We discussed the sx which are most concerning (e.g., sudden worsening pain, fever, inability to tolerate by mouth) that necessitate immediate return. Medications administered to the patient during their visit and any new prescriptions provided to the patient are listed below.  Medications given during this visit Medications  sodium chloride  0.9 % bolus 1,000 mL (0 mLs Intravenous Stopped 04/27/24 1723)  prochlorperazine (COMPAZINE) injection 5 mg (5 mg Intravenous Given  04/27/24 1605)  diphenhydrAMINE  (BENADRYL ) injection 12.5 mg (12.5 mg Intravenous Given 04/27/24 1605)  meclizine (ANTIVERT) tablet 25 mg (25 mg Oral Given 04/27/24 1608)  prochlorperazine (COMPAZINE) injection 5 mg (5 mg Intravenous Given 04/27/24 1813)     The patient appears reasonably screen and/or stabilized for discharge and I doubt any other medical condition or other [facility] requiring further screening, evaluation, or treatment in the ED at this time prior to discharge.          Final Clinical Impression(s) / ED Diagnoses Final diagnoses:  Nausea and vomiting in adult  Dehydration  Dizziness    Rx / DC Orders ED Discharge Orders          Ordered    meclizine (ANTIVERT) 25 MG tablet  3 times daily PRN        04/27/24 1849    promethazine (PHENERGAN) 25 MG tablet  Every 6 hours PRN        04/27/24 1849              Sallie Staron, DO 04/27/24 1851

## 2024-04-27 NOTE — ED Notes (Signed)
 Son Janet Steele called requesting update on mother. Pt stated it is okay to given information to son. This Nurse advised son that pt is resting and aaox4 and awaiting results and following up consult from ED MD. Pt son verbalized understanding and advised once pt is discharged this nurse should call [facility] to arrange transportation home.

## 2024-04-27 NOTE — Discharge Instructions (Signed)
 Please return for worsening symptoms inability eat or drink.  Your kidney function was worse today than it typically is.  Please call your family doctor to hopefully see them in the office in the next week or 2 to have it rechecked.

## 2024-04-27 NOTE — Telephone Encounter (Signed)
 Chief Complaint: nausea Symptoms: nausea, dizzy, weak, pale Frequency: x today  Disposition: [x] ED /[] Urgent Care (no appt availability in office) / [] Appointment(In office/virtual)/ []  [place] [facility]/ [] Home Care/ [] Refused Recommended Disposition /[] [place] [facility]/ []  Follow-up with PCP  Additional Notes: [name], DON @ [facility]. States that pts vital are good, but she is pale, flushed face and weak. State BG was 215. States she is nauseous and dizzy as well. States that pt is eating fine and was fine on yesterday. States they will send pt out to hospital as no appts available today.   Copied from CRM [id number]. Topic: Clinical - Red Word Triage >> Apr 27, 2024  1:46 PM [name] wrote: Red Word that prompted transfer to Nurse Triage: Janet Steele with Director of nursing states that patient is pale, has flushed face, weak, blood sugar is 216, and she is dizzy. Reason for Disposition  Patient sounds very sick or weak to the triager  Protocols used: Diabetes - High Blood Sugar-A-AH

## 2024-04-27 NOTE — ED Notes (Signed)
 Report given to [place] Med tech at [facility]. She was advised pt has been discharge. This nurse asked Med Tech at [facility] if they have transportation for pt she stated no and that [facility] is the only way home for the pt. Pt was updated and family was updated.

## 2024-04-27 NOTE — ED Triage Notes (Signed)
 Pt arrived via [facility] from [facility], n/v and dizziness and blurred vision, onset yesterday afternoon. Per spouse has history. 4mg  zofran . Pacemaker.

## 2024-04-27 NOTE — ED Notes (Signed)
 This nurse offered pt bathroom assistance, pt was placed on bed pan and was unable to go. Pt stated she had already gone in her brief and couldn't find her call light. This nurse advised pt that we may have to do in and out on her, Pt declined any cath

## 2024-05-08 NOTE — Addendum Note (Signed)
 Addended by: Edra Govern D on: 05/08/2024 03:46 PM   Modules accepted: Orders

## 2024-05-08 NOTE — Progress Notes (Signed)
 Remote pacemaker transmission.

## 2024-05-11 ENCOUNTER — Ambulatory Visit

## 2024-05-16 ENCOUNTER — Ambulatory Visit

## 2024-05-16 ENCOUNTER — Ambulatory Visit (INDEPENDENT_AMBULATORY_CARE_PROVIDER_SITE_OTHER): Admitting: Internal Medicine

## 2024-05-16 ENCOUNTER — Encounter: Payer: Self-pay | Admitting: Internal Medicine

## 2024-05-16 VITALS — BP 112/62 | HR 60 | Temp 97.8°F | Ht 60.0 in | Wt 115.0 lb

## 2024-05-16 VITALS — BP 112/62 | Ht 60.0 in | Wt 115.0 lb

## 2024-05-16 DIAGNOSIS — I25810 Atherosclerosis of coronary artery bypass graft(s) without angina pectoris: Secondary | ICD-10-CM | POA: Diagnosis not present

## 2024-05-16 DIAGNOSIS — I739 Peripheral vascular disease, unspecified: Secondary | ICD-10-CM

## 2024-05-16 DIAGNOSIS — E782 Mixed hyperlipidemia: Secondary | ICD-10-CM | POA: Diagnosis not present

## 2024-05-16 DIAGNOSIS — Z0000 Encounter for general adult medical examination without abnormal findings: Secondary | ICD-10-CM

## 2024-05-16 DIAGNOSIS — E1165 Type 2 diabetes mellitus with hyperglycemia: Secondary | ICD-10-CM | POA: Diagnosis not present

## 2024-05-16 DIAGNOSIS — I6930 Unspecified sequelae of cerebral infarction: Secondary | ICD-10-CM

## 2024-05-16 DIAGNOSIS — F03918 Unspecified dementia, unspecified severity, with other behavioral disturbance: Secondary | ICD-10-CM | POA: Diagnosis not present

## 2024-05-16 DIAGNOSIS — R7989 Other specified abnormal findings of blood chemistry: Secondary | ICD-10-CM

## 2024-05-16 DIAGNOSIS — N1832 Chronic kidney disease, stage 3b: Secondary | ICD-10-CM

## 2024-05-16 DIAGNOSIS — Z0100 Encounter for examination of eyes and vision without abnormal findings: Secondary | ICD-10-CM

## 2024-05-16 DIAGNOSIS — E039 Hypothyroidism, unspecified: Secondary | ICD-10-CM

## 2024-05-16 DIAGNOSIS — R1319 Other dysphagia: Secondary | ICD-10-CM | POA: Diagnosis not present

## 2024-05-16 DIAGNOSIS — I10 Essential (primary) hypertension: Secondary | ICD-10-CM

## 2024-05-16 MED ORDER — SHINGRIX 50 MCG/0.5ML IM SUSR: 0.5000 mL | Freq: Once | INTRAMUSCULAR | 0 refills | Status: AC

## 2024-05-16 NOTE — Assessment & Plan Note (Signed)
Chronic H/o low B12 Check B12 level

## 2024-05-16 NOTE — Assessment & Plan Note (Signed)
Chronic Continue atorvastatin 80 mg daily, Plavix 75 mg daily

## 2024-05-16 NOTE — Assessment & Plan Note (Signed)
 Chronic No concerning symptoms on angina Continue current medications

## 2024-05-16 NOTE — Progress Notes (Signed)
 Subjective:   Janet Steele is a 89 y.o. who presents for a Medicare Wellness preventive visit.  As a reminder, Annual Wellness Visits don't include a physical exam, and some assessments may be limited, especially if this visit is performed virtually. We may recommend an in-person follow-up visit with your provider if needed.  Visit Complete: In person  Persons Participating in Visit: Patient assisted by Dene Fines..  AWV Questionnaire: No: Patient Medicare AWV questionnaire was not completed prior to this visit.  Cardiac Risk Factors include: advanced age (>60men, >55 women);hypertension;diabetes mellitus;dyslipidemia     Objective:     Today's Vitals   05/16/24 1305  BP: 112/62  Weight: 115 lb (52.2 kg)  Height: 5' (1.524 m)   Body mass index is 22.46 kg/m.     05/16/2024   12:59 PM 04/27/2024    5:08 PM 06/04/2020   12:53 PM 02/25/2020    4:45 PM 01/24/2020    5:11 PM 01/15/2020    5:19 PM 01/15/2020    3:03 PM  Advanced Directives  Does Patient Have a Medical Advance Directive? Yes Yes Yes Yes Yes Yes Unable to assess, patient is non-responsive or altered mental status  Type of Public librarian Power of [place];Living will Out of facility DNR (pink MOST or yellow form) Living will Healthcare Power of [place];Living will Living will Living will   Does patient want to make changes to medical advance directive? No - Patient declined  No - Patient declined No - Guardian declined No - Patient declined No - Patient declined   Copy of Healthcare Power of Attorney in Chart? Yes - validated most recent copy scanned in chart (See row information)        Pre-existing out of facility DNR order (yellow form or pink MOST form)  Pink Most/Yellow Form available - Physician notified to receive inpatient order         Current Medications (verified) Outpatient Encounter Medications as of 05/16/2024  Medication Sig   albuterol  (PROVENTIL ) (2.5 MG/3ML) 0.083% nebulizer  solution Take 3 mLs (2.5 mg total) by nebulization every 12 (twelve) hours as needed for wheezing or shortness of breath.   ASPIRIN  LOW DOSE 81 MG chewable tablet GIVE 1 TAB BY MOUTH ONCE DAILY   atorvastatin  (LIPITOR ) 80 MG tablet GIVE 1 TAB BY MOUTH ONCE DAILY F/U APPT DUE IN SEPT FOR FUTURE REFILLS   clopidogrel  (PLAVIX ) 75 MG tablet TAKE 1 TABLET (75 MG TOTAL) BY MOUTH DAILY. F/U APPT DUE IN SEPT FOR FUTURE REFILLS   donepezil  (ARICEPT ) 5 MG tablet Take 1 tablet (5 mg total) by mouth at bedtime.   DULoxetine  (CYMBALTA ) 60 MG capsule GIVE 1 CAP BY MOUTH ONCE DAILY F/U APPT DUE IN SEPT FOR FUTURE REFILLS   glimepiride  (AMARYL ) 1 MG tablet TAKE 1 TABLET (1 MG TOTAL) BY MOUTH DAILY WITH BREAKFAST. F/U APPT DUE IN SEPT FOR FUTURE REFILLS   levothyroxine  (SYNTHROID ) 112 MCG tablet Take 1 tablet (112 mcg total) by mouth daily.   meclizine  (ANTIVERT ) 25 MG tablet Take 1 tablet (25 mg total) by mouth 3 (three) times daily as needed for dizziness.   metoprolol  tartrate (LOPRESSOR ) 25 MG tablet TAKE 1 TAB BY MOUTH TWICE DAILY   nitroGLYCERIN  (NITROSTAT ) 0.4 MG SL tablet TAKE 1 TAB SUBLINGUALLY EVERY 5 MINUTES FOR 3 DOSES AS NEEDED FOR CHEST PAIN. IF NO RELIEF CALL MD.   OLANZapine  (ZYPREXA ) 5 MG tablet TAKE DAILY AT 4PM   ondansetron  (ZOFRAN -ODT) 4 MG disintegrating tablet  TAKE 1 TAB BY MOUTH EVERY 8 HOURS AS NEEDED FOR NAUSEA   pantoprazole  (PROTONIX ) 40 MG tablet TAKE 1 TAB (40MG ) BY MOUTH ONCE DAILY   promethazine  (PHENERGAN ) 25 MG tablet Take 1 tablet (25 mg total) by mouth every 6 (six) hours as needed for nausea or vomiting.   SYMBICORT  80-4.5 MCG/ACT inhaler Inhale 2 puffs into the lungs 2 (two) times daily.   guaiFENesin  (MUCINEX ) 600 MG 12 hr tablet Take 1 tablet (600 mg total) by mouth 2 (two) times daily. (Patient not taking: Reported on 05/16/2024)   No facility-administered encounter medications on file as of 05/16/2024.    Allergies (verified) Morphine, Penicillins, Prednisone , Sulfonamide  derivatives, Codeine, Hydrocodone -acetaminophen , Meperidine hcl, Metoclopramide hcl, Metoprolol  succinate, Moxifloxacin, Oxycodone -aspirin , Pentazocine lactate, Pioglitazone, and Trazodone  and nefazodone   History: Past Medical History:  Diagnosis Date   Anemia    Anxiety    Arthritis    fingers (05/19/2016   CAD (coronary artery disease)    a. s/p CABG 1982 b. redo CABG 2003. c. [place]  despite med rx 05/2016: successful but complicated PCI of PDA beyond graft site, c/b localized dissection requiring overlapping stent.   Carotid bruit    a. Duplex 01/2015: patent vessels, 1-39% BICA, f/u PRN recommened.   Colon polyp    adenomatous   Colon, diverticulosis    Degenerative joint disease    Diastolic dysfunction    Esophageal stricture    GERD (gastroesophageal reflux disease)    Hiatal hernia    History of blood transfusion    related to my bypass   Hyperlipidemia    Hypertension    Hypothyroidism    Ischemic cardiomyopathy    a. Cath 05/2016: EF 40% with severe inferior wall HK, suspect hibernating myocardium.   Myocardial infarction (HCC) 1977; 1982   Osteoarthrosis, unspecified whether generalized or localized, unspecified site    Presence of permanent cardiac pacemaker    PVD (peripheral vascular disease) (HCC)    Stroke (HCC)    greater than 2 years ago per husband   Type II diabetes mellitus (HCC)    Unspecified hearing loss    Past Surgical History:  Procedure Laterality Date   ABDOMINAL HYSTERECTOMY     APPENDECTOMY     BACK SURGERY     CARDIAC CATHETERIZATION N/A 05/19/2016   Procedure: Left Heart Cath and Cors/Grafts Angiography;  Surgeon: Arty Binning, MD;  Location: [facility] INVASIVE CV LAB;  Service: Cardiovascular;  Laterality: N/A;   CARDIAC CATHETERIZATION N/A 05/19/2016   Procedure: Coronary Stent Intervention;  Surgeon: Arty Binning, MD;  Location: [facility] INVASIVE CV LAB;  Service: Cardiovascular;  Laterality: N/A;   CATARACT EXTRACTION W/ INTRAOCULAR LENS  IMPLANT,  BILATERAL Bilateral    CHOLECYSTECTOMY OPEN     COLONOSCOPY     CORONARY ANGIOPLASTY WITH STENT PLACEMENT  05/19/2016   2 stents?   CORONARY ARTERY BYPASS GRAFT  1982; 2003   x4(1982),02-2002 CABG X2   ESOPHAGOGASTRODUODENOSCOPY (EGD) WITH ESOPHAGEAL DILATION  2-3 times   INSERT / REPLACE / REMOVE PACEMAKER     LEFT HEART CATHETERIZATION WITH CORONARY/GRAFT ANGIOGRAM N/A 03/05/2014   Procedure: LEFT HEART CATHETERIZATION WITH Estella Helling;  Surgeon: Mickiel Albany, MD;  Location: [facility] CATH LAB;  Service: Cardiovascular;  Laterality: N/A;   LUMBAR DISC SURGERY  01-2000   OOPHORECTOMY Bilateral    PERMANENT PACEMAKER INSERTION N/A 07/06/2012   MDT Adapta L implanted by Dr Nunzio Belch for symptomatic bradycardia   TONSILLECTOMY  1930s   UPPER GASTROINTESTINAL ENDOSCOPY  Family History  Problem Relation Age of Onset   Heart disease Sister    Heart attack Sister    Colon cancer Neg Hx    Breast cancer Neg Hx    Celiac disease Neg Hx    Cirrhosis Neg Hx    Clotting disorder Neg Hx    Colitis Neg Hx    Colon polyps Neg Hx    Crohn's disease Neg Hx    Cystic fibrosis Neg Hx    Diabetes Neg Hx    Esophageal cancer Neg Hx    Hemochromatosis Neg Hx    Inflammatory bowel disease Neg Hx    Irritable bowel syndrome Neg Hx    Kidney disease Neg Hx    Liver cancer Neg Hx    Liver disease Neg Hx    Ovarian cancer Neg Hx    Pancreatic cancer Neg Hx    Prostate cancer Neg Hx    Rectal cancer Neg Hx    Stomach cancer Neg Hx    Ulcerative colitis Neg Hx    Uterine cancer Neg Hx    Wilson's disease Neg Hx    Social History   Socioeconomic History   Marital status: Married    Spouse name: Sammie Crigler   Number of children: 3   Years of education: 16   Highest education level: Not on file  Occupational History   Occupation: Retired     Associate Professor: RETIRED  Tobacco Use   Smoking status: Former    Current packs/day: 0.00    Average packs/day: 0.5 packs/day for 30.0 years (15.0  ttl pk-yrs)    Types: Cigarettes    Start date: 07/05/1946    Quit date: 07/05/1976    Years since quitting: 47.8   Smokeless tobacco: Never  Vaping Use   Vaping status: Never Used  Substance and Sexual Activity   Alcohol use: No    Alcohol/week: 0.0 standard drinks of alcohol    Comment: not anymore, I haven't in I don't know years   Drug use: No   Sexual activity: Never  Other Topics Concern   Not on file  Social History Narrative   Patient is married Sammie Crigler) and lives at home with her husband.   Patient has three adult children.   Patient is retired.   Patient has a college education.   Patient is right-handed.   Patient does not drink any caffeine.   Social Drivers of Corporate investment banker Strain: Low Risk  (05/16/2024)   Overall Financial Resource Strain (CARDIA)    Difficulty of Paying Living Expenses: Not hard at all  Food Insecurity: No Food Insecurity (05/16/2024)   Hunger Vital Sign    Worried About Running Out of Food in the Last Year: Never true    Ran Out of Food in the Last Year: Never true  Transportation Needs: No Transportation Needs (05/16/2024)   PRAPARE - Administrator, Civil Service (Medical): No    Lack of Transportation (Non-Medical): No  Physical Activity: Inactive (05/16/2024)   Exercise Vital Sign    Days of Exercise per Week: 0 days    Minutes of Exercise per Session: 0 min  Stress: No Stress Concern Present (05/16/2024)   [employer] of Occupational Health - Occupational Stress Questionnaire    Feeling of Stress : Not at all  Social Connections: Moderately Isolated (05/16/2024)   Social Connection and Isolation Panel [NHANES]    Frequency of Communication with Friends and Family: More than three times a  week    Frequency of Social Gatherings with Friends and Family: Never    Attends Religious Services: Never    Database administrator or Organizations: No    Attends Engineer, structural: Never    Marital  Status: Married    Tobacco Counseling Counseling given: No    Clinical Intake:  Pre-visit preparation completed: Yes  Pain : No/denies pain     BMI - recorded: 22.46 Nutritional Status: BMI of 19-24  Normal Nutritional Risks: None Diabetes: Yes CBG done?: No Did pt. bring in CBG monitor from home?: No  Lab Results  Component Value Date   HGBA1C 7.6 (H) 11/21/2023   HGBA1C 6.8 (H) 02/21/2023   HGBA1C 6.7 (H) 07/27/2022     How often do you need to have someone help you when you read instructions, pamphlets, or other written materials from your doctor or pharmacy?: 5 - Always (Caregiver/Children)  Interpreter Needed?: No  Information entered by :: Kandy Orris, CMA   Activities of Daily Living     05/16/2024    1:12 PM  In your present state of health, do you have any difficulty performing the following activities:  Hearing? 1  Comment will not use hearing aids  Vision? 1  Comment referral to Dr Elnita Hai  Difficulty concentrating or making decisions? 1  Comment Dx-Dementia  Walking or climbing stairs? 1  Dressing or bathing? 1  Doing errands, shopping? 1  Preparing Food and eating ? Y  Comment only can feed herself (no preparing)  Using the Toilet? Y  Comment Caregiver/Children  In the past six months, have you accidently leaked urine? Y  Comment wears a depend  Do you have problems with loss of bowel control? Y  Comment wears a depend  Managing your Medications? Y  Comment Caregiver/Children  Managing your Finances? Y  Comment Children  Housekeeping or managing your Housekeeping? Y  Comment Caregiver/Children    Patient Care Team: Colene Dauphin, MD as PCP - General (Internal Medicine) Arnoldo Lapping, MD as PCP - Cardiology (Cardiology) Mealor, Donnamae Gaba, MD as PCP - Electrophysiology (Cardiology) Clem Currier, NP as Registered Nurse (Neurology) Tobin Forts, MD as Consulting Physician (Gastroenterology) Szabat, Tino Foreman, [facility] (Inactive)  (Pharmacist) Linard Reno, MD as Consulting Physician (Ophthalmology)  I have updated your Care Teams any recent Medical Services you may have received from other providers in the past year.     Assessment:    This is a routine wellness examination for Janet Steele.  Hearing/Vision screen Hearing Screening - Comments:: Has hearing aids - will not use per Son, stated Vision Screening - Comments:: Referral to Dr Linard Reno    Goals Addressed               This Visit's Progress     Patient Stated (pt-stated)        Patient's son, Blaise Bumps, mentioned that she's unable to walk.  Resides in Assisted Living.       Depression Screen     05/16/2024    1:17 PM 11/21/2023    2:19 PM 05/30/2023    1:46 PM 02/21/2023    1:30 PM 12/21/2022    2:39 PM 12/02/2022    1:39 PM 11/12/2022   11:09 AM  PHQ 2/9 Scores  PHQ - 2 Score 1 0 0 0 0 0 0  PHQ- 9 Score 7 0   1      Fall Risk     05/16/2024    1:15 PM  11/21/2023    2:19 PM 05/30/2023    1:45 PM 02/21/2023    1:30 PM 12/21/2022    2:39 PM  Fall Risk   Falls in the past year? 1 0 1 0 0  Number falls in past yr: 1 0 0 0 0  Injury with Fall? 0 0 1 0 0  Comment only bruises  bruised right elbow    Risk for fall due to : History of fall(s);Impaired balance/gait No Fall Risks Other (Comment) No Fall Risks No Fall Risks  Follow up Falls evaluation completed;Falls prevention discussed Falls evaluation completed Falls evaluation completed Falls evaluation completed Falls evaluation completed    MEDICARE RISK AT HOME:  Medicare Risk at Home Any stairs in or around the home?: No If so, are there any without handrails?: No Home free of loose throw rugs in walkways, pet beds, electrical cords, etc?: Yes Adequate lighting in your home to reduce risk of falls?: Yes Life alert?: Yes Use of a cane, walker or w/c?: Yes (wheelchair) Grab bars in the bathroom?: Yes Shower chair or bench in shower?: Yes Elevated toilet seat or a handicapped  toilet?: Yes  TIMED UP AND GO:  Was the test performed?  No  Cognitive Function: Impaired: Patient has current diagnosis of cognitive impairment.    05/16/2024    1:16 PM 01/02/2019    3:35 PM 04/05/2017    1:43 PM 01/17/2017    1:52 PM 01/04/2017    1:26 PM  MMSE - Mini Mental State Exam  Not completed: Unable to complete Refused     Orientation to time   5 5 3   Orientation to Place   4 5 5   Registration   3 2 3   Attention/ Calculation   1 2 5   Recall   1 2 3   Language- name 2 objects   2 2 2   Language- repeat   0 1 1  Language- follow 3 step command   3 2 3   Language- read & follow direction   1 1 1   Write a sentence   1 1 1   Copy design   1 1 1   Total score   22 24 28       11/06/2015    1:34 PM  Montreal Cognitive Assessment   Visuospatial/ Executive (0/5) 0  Naming (0/3) 3  Attention: Read list of digits (0/2) 1      06/04/2020   12:55 PM  6CIT Screen  What Year? 0 points  What month? 0 points  What time? 3 points  Count back from 20 0 points  Months in reverse 0 points  Repeat phrase 0 points  Total Score 3 points    Immunizations Immunization History  Administered Date(s) Administered   Fluad Quad(high Dose 65+) 08/17/2019, 10/23/2022   Influenza Whole 12/06/1997, 09/25/2007   Influenza, High Dose Seasonal PF 09/27/2014, 09/06/2017, 09/29/2018   Influenza-Unspecified 08/06/2013, 08/13/2015, 10/15/2016   PFIZER(Purple Top)SARS-COV-2 Vaccination 01/10/2020, 02/20/2020   PPD Test 03/30/2023   Pneumococcal Conjugate-13 08/06/2015   Pneumococcal Polysaccharide-23 12/06/1996, 05/28/2010   Td 05/06/2004   Tdap 06/05/2018    Screening Tests Health Maintenance  Topic Date Due   Zoster Vaccines- Shingrix (1 of 2) Never done   FOOT EXAM  11/03/2021   OPHTHALMOLOGY EXAM  01/08/2022   DEXA SCAN  05/16/2025 (Originally 10/12/1996)   HEMOGLOBIN A1C  05/21/2024   INFLUENZA VACCINE  07/06/2024   Medicare Annual Wellness (AWV)  05/16/2025   DTaP/Tdap/Td (3 - Td  or  Tdap) 06/05/2028   Pneumonia Vaccine 76+ Years old  Completed   HPV VACCINES  Aged Out   Meningococcal B Vaccine  Aged Out   COVID-19 Vaccine  Discontinued    Health Maintenance  Health Maintenance Due  Topic Date Due   Zoster Vaccines- Shingrix (1 of 2) Never done   FOOT EXAM  11/03/2021   OPHTHALMOLOGY EXAM  01/08/2022   Health Maintenance Items Addressed:  Referral sent to Optometry/Ophthalmology - Linard Reno  Additional Screening:  Vision Screening: Recommended annual ophthalmology exams for early detection of glaucoma and other disorders of the eye. Would you like a referral to an eye doctor? Yes    Dental Screening: Recommended annual dental exams for proper oral hygiene  Community Resource Referral / Chronic Care Management: CRR required this visit?  No   CCM required this visit?  No   Plan:    I have personally reviewed and noted the following in the patient's chart:   Medical and social history Use of alcohol, tobacco or illicit drugs  Current medications and supplements including opioid prescriptions. Patient is not currently taking opioid prescriptions. Functional ability and status Nutritional status Physical activity Advanced directives List of other physicians Hospitalizations, surgeries, and ER visits in previous 12 months Vitals Screenings to include cognitive, depression, and falls Referrals and appointments  In addition, I have reviewed and discussed with patient certain preventive protocols, quality metrics, and best practice recommendations. A written personalized care plan for preventive services as well as general preventive health recommendations were provided to patient.   Patria Bookbinder, CMA   05/16/2024   After Visit Summary: (In Person-Printed) AVS printed and given to the patient  Notes: Nothing significant to report at this time.

## 2024-05-16 NOTE — Assessment & Plan Note (Signed)
 Chronic It has been a change in her over the last 6 months-she is sleeping more, less mobile Agitation, sundowning is controlled Continue Zyprexa  5 mg every evening

## 2024-05-16 NOTE — Assessment & Plan Note (Signed)
 Chronic Most recent blood work showed worsening of her GFR Does not follow with nephrology I discussed with her signs and husband that if her kidney function got much worse we would have to make a decision regarding hemodialysis I do not think she is in good candidate for hemodialysis given her dementia The family will think about this

## 2024-05-16 NOTE — Progress Notes (Signed)
 Subjective:    Patient ID: Janet Steele, female    DOB: Feb 16, 1929, 89 y.o.   MRN: [id number]     HPI Janet Steele is here for follow up of her chronic medical problems.  She is here with her husband and 2 sons.   They state over the last 6 months she has slowed down a lot.  She is sleeping a lot more and is less mobile.   She did go to the emergency room last month and had blood work done-kidney function was worse  Cough with and without eating.  90% of the time she vomits it.  Even with chopped up food.    Medications and allergies reviewed with patient and updated if appropriate.  Current Outpatient Medications on File Prior to Visit  Medication Sig Dispense Refill   albuterol  (PROVENTIL ) (2.5 MG/3ML) 0.083% nebulizer solution Take 3 mLs (2.5 mg total) by nebulization every 12 (twelve) hours as needed for wheezing or shortness of breath. 150 mL 3   ASPIRIN  LOW DOSE 81 MG chewable tablet GIVE 1 TAB BY MOUTH ONCE DAILY 30 tablet 11   atorvastatin  (LIPITOR ) 80 MG tablet GIVE 1 TAB BY MOUTH ONCE DAILY F/U APPT DUE IN SEPT FOR FUTURE REFILLS 30 tablet 3   clopidogrel  (PLAVIX ) 75 MG tablet TAKE 1 TABLET (75 MG TOTAL) BY MOUTH DAILY. F/U APPT DUE IN SEPT FOR FUTURE REFILLS 30 tablet 11   donepezil  (ARICEPT ) 5 MG tablet Take 1 tablet (5 mg total) by mouth at bedtime. 30 tablet 11   DULoxetine  (CYMBALTA ) 60 MG capsule GIVE 1 CAP BY MOUTH ONCE DAILY F/U APPT DUE IN SEPT FOR FUTURE REFILLS 30 capsule 11   glimepiride  (AMARYL ) 1 MG tablet TAKE 1 TABLET (1 MG TOTAL) BY MOUTH DAILY WITH BREAKFAST. F/U APPT DUE IN SEPT FOR FUTURE REFILLS 30 tablet 11   guaiFENesin  (MUCINEX ) 600 MG 12 hr tablet Take 1 tablet (600 mg total) by mouth 2 (two) times daily. (Patient not taking: Reported on 05/16/2024) 180 tablet 3   levothyroxine  (SYNTHROID ) 112 MCG tablet Take 1 tablet (112 mcg total) by mouth daily. 90 tablet 3   meclizine  (ANTIVERT ) 25 MG tablet Take 1 tablet (25 mg total) by mouth 3 (three)  times daily as needed for dizziness. 30 tablet 0   metoprolol  tartrate (LOPRESSOR ) 25 MG tablet TAKE 1 TAB BY MOUTH TWICE DAILY 60 tablet 10   nitroGLYCERIN  (NITROSTAT ) 0.4 MG SL tablet TAKE 1 TAB SUBLINGUALLY EVERY 5 MINUTES FOR 3 DOSES AS NEEDED FOR CHEST PAIN. IF NO RELIEF CALL MD. 25 tablet 5   OLANZapine  (ZYPREXA ) 5 MG tablet TAKE DAILY AT 4PM 30 tablet 3   ondansetron  (ZOFRAN -ODT) 4 MG disintegrating tablet TAKE 1 TAB BY MOUTH EVERY 8 HOURS AS NEEDED FOR NAUSEA 30 tablet 11   pantoprazole  (PROTONIX ) 40 MG tablet TAKE 1 TAB (40MG ) BY MOUTH ONCE DAILY 30 tablet 11   promethazine  (PHENERGAN ) 25 MG tablet Take 1 tablet (25 mg total) by mouth every 6 (six) hours as needed for nausea or vomiting. 30 tablet 0   SYMBICORT  80-4.5 MCG/ACT inhaler Inhale 2 puffs into the lungs 2 (two) times daily. 6.9 g 5   No current facility-administered medications on file prior to visit.     Review of Systems  Unable to perform ROS: Dementia       Objective:   Vitals:   05/16/24 1410  BP: 112/62  Pulse: 60  Temp: 97.8 F (36.6 C)  SpO2: 94%  BP Readings from Last 3 Encounters:  05/16/24 112/62  05/16/24 112/62  04/27/24 (!) 117/101   Wt Readings from Last 3 Encounters:  05/16/24 115 lb (52.2 kg)  05/16/24 115 lb (52.2 kg)  04/27/24 115 lb (52.2 kg)   Body mass index is 22.46 kg/m.    Physical Exam Constitutional:      General: She is not in acute distress.    Appearance: Normal appearance.  HENT:     Head: Normocephalic and atraumatic.  Eyes:     Conjunctiva/sclera: Conjunctivae normal.  Cardiovascular:     Rate and Rhythm: Normal rate and regular rhythm.     Heart sounds: Normal heart sounds.  Pulmonary:     Effort: Pulmonary effort is normal. No respiratory distress.     Breath sounds: Normal breath sounds. No wheezing.  Abdominal:     Palpations: Abdomen is soft.     Tenderness: There is no abdominal tenderness.  Musculoskeletal:     Cervical back: Neck supple.      Right lower leg: Edema (Trace) present.     Left lower leg: Edema (Trace) present.  Lymphadenopathy:     Cervical: No cervical adenopathy.  Skin:    General: Skin is warm and dry.     Findings: No rash.  Neurological:     Mental Status: She is alert. Mental status is at baseline.  Psychiatric:        Mood and Affect: Mood normal.        Lab Results  Component Value Date   WBC 7.1 04/27/2024   HGB 10.5 (L) 04/27/2024   HCT 34.4 (L) 04/27/2024   PLT 282 04/27/2024   GLUCOSE 220 (H) 04/27/2024   CHOL 192 11/21/2023   TRIG 195.0 (H) 11/21/2023   HDL 42.60 11/21/2023   LDLDIRECT 254.0 11/01/2017   LDLCALC 111 (H) 11/21/2023   ALT 15 04/27/2024   AST 25 04/27/2024   NA 136 04/27/2024   K 4.6 04/27/2024   CL 95 (L) 04/27/2024   CREATININE 2.30 (H) 04/27/2024   BUN 25 (H) 04/27/2024   CO2 28 04/27/2024   TSH 27.21 (H) 11/21/2023   INR 0.99 04/04/2018   HGBA1C 7.6 (H) 11/21/2023   MICROALBUR 3.6 (H) 05/28/2010     Assessment & Plan:    See Problem List for Assessment and Plan of chronic medical problems.

## 2024-05-16 NOTE — Assessment & Plan Note (Signed)
 Chronic Continue levothyroxine  112 mcg daily Check TSH

## 2024-05-16 NOTE — Assessment & Plan Note (Signed)
 Chronic Her mobility has decreased significantly over the last 6 months and she is sleeping a lot Continue aspirin  81 mg daily, atorvastatin  80 mg daily, Plavix  75 mg daily

## 2024-05-16 NOTE — Assessment & Plan Note (Signed)
 Chronic Having increased dysphagia-she is choking on food, coughs while eating and vomits most the time when eating This is likely related to an esophageal stricture Family does not want to take her back to GI They will try doing liquids only

## 2024-05-16 NOTE — Assessment & Plan Note (Signed)
 Chronic BP controlled Continue metoprolol  25 mg bid CMP, CBC Can consider stopping medication-will see what blood work looks like

## 2024-05-16 NOTE — Assessment & Plan Note (Signed)
Chronic Check A1c Continue glimepiride 1 mg every morning

## 2024-05-16 NOTE — Patient Instructions (Signed)
 Ms. Blakney , Thank you for taking time out of your busy schedule to complete your Annual Wellness Visit with me. I enjoyed our conversation and look forward to speaking with you again next year. I, as well as your care team,  appreciate your ongoing commitment to your health goals. Please review the following plan we discussed and let me know if I can assist you in the future. Your Game plan/ To Do List    Referrals: If you haven't heard from the office you've been referred to, please reach out to them at the phone provided.  Referral to Dr Elnita Hai (Ophthalmologist) for an eye exam.   Follow up Visits: Next Medicare AWV with our clinical staff: 05/23/2025   Have you seen your provider in the last 6 months (3 months if uncontrolled diabetes)? Yes Next Office Visit with your provider: 05/16/2024   Clinician Recommendations:  Aim for 30 minutes of exercise or brisk walking, 6-8 glasses of water, and 5 servings of fruits and vegetables each day. Educated and advised on having the Shingles and COVID vaccines in 2025.        This is a list of the screening recommended for you and due dates:  Health Maintenance  Topic Date Due   Zoster (Shingles) Vaccine (1 of 2) Never done   Complete foot exam   11/03/2021   Eye exam for diabetics  01/08/2022   DEXA scan (bone density measurement)  05/16/2025*   Hemoglobin A1C  05/21/2024   Flu Shot  07/06/2024   Medicare Annual Wellness Visit  05/16/2025   DTaP/Tdap/Td vaccine (3 - Td or Tdap) 06/05/2028   Pneumonia Vaccine  Completed   HPV Vaccine  Aged Out   Meningitis B Vaccine  Aged Out   COVID-19 Vaccine  Discontinued  *Topic was postponed. The date shown is not the original due date.    Advanced directives: (In Chart) A copy of your advanced directives are scanned into your chart should your provider ever need it. Advance Care Planning is important because it:  [x]  Makes sure you receive the medical care that is consistent with your values, goals, and  preferences  [x]  It provides guidance to your family and loved ones and reduces their decisional burden about whether or not they are making the right decisions based on your wishes.  Follow the link provided in your after visit summary or read over the paperwork we have mailed to you to help you started getting your Advance Directives in place. If you need assistance in completing these, please reach out to us  so that we can help you!

## 2024-05-16 NOTE — Patient Instructions (Addendum)
      Blood work was ordered.       Medications changes include :   None      Return in about 6 months (around 11/15/2024) for follow up.

## 2024-05-16 NOTE — Assessment & Plan Note (Signed)
Chronic Check lipid panel  Continue atorvastatin 80 mg daily 

## 2024-05-17 ENCOUNTER — Ambulatory Visit: Payer: Self-pay | Admitting: Internal Medicine

## 2024-05-17 ENCOUNTER — Other Ambulatory Visit: Payer: Self-pay

## 2024-05-17 ENCOUNTER — Telehealth: Payer: Self-pay | Admitting: Internal Medicine

## 2024-05-17 LAB — CBC WITH DIFFERENTIAL/PLATELET
Absolute Lymphocytes: 1175 {cells}/uL (ref 850–3900)
Absolute Monocytes: 332 {cells}/uL (ref 200–950)
Basophils Absolute: 16 {cells}/uL (ref 0–200)
Basophils Relative: 0.2 %
Eosinophils Absolute: 146 {cells}/uL (ref 15–500)
Eosinophils Relative: 1.8 %
HCT: 34.3 % — ABNORMAL LOW (ref 35.0–45.0)
Hemoglobin: 10.6 g/dL — ABNORMAL LOW (ref 11.7–15.5)
MCH: 27.9 pg (ref 27.0–33.0)
MCHC: 30.9 g/dL — ABNORMAL LOW (ref 32.0–36.0)
MCV: 90.3 fL (ref 80.0–100.0)
MPV: 10.8 fL (ref 7.5–12.5)
Monocytes Relative: 4.1 %
Neutro Abs: 6431 {cells}/uL (ref 1500–7800)
Neutrophils Relative %: 79.4 %
Platelets: 215 10*3/uL (ref 140–400)
RBC: 3.8 10*6/uL (ref 3.80–5.10)
RDW: 14.2 % (ref 11.0–15.0)
Total Lymphocyte: 14.5 %
WBC: 8.1 10*3/uL (ref 3.8–10.8)

## 2024-05-17 LAB — LIPID PANEL
Cholesterol: 223 mg/dL — ABNORMAL HIGH (ref <200)
HDL: 50 mg/dL (ref >=50)
LDL Cholesterol (Calc): 136 mg/dL — ABNORMAL HIGH
Non-HDL Cholesterol (Calc): 173 mg/dL — ABNORMAL HIGH (ref <130)
Total CHOL/HDL Ratio: 4.5 (calc) (ref <5.0)
Triglycerides: 228 mg/dL — ABNORMAL HIGH (ref <150)

## 2024-05-17 LAB — COMPREHENSIVE METABOLIC PANEL WITH GFR
AG Ratio: 1.8 (calc) (ref 1.0–2.5)
ALT: 10 U/L (ref 6–29)
AST: 19 U/L (ref 10–35)
Albumin: 4.2 g/dL (ref 3.6–5.1)
Alkaline phosphatase (APISO): 83 U/L (ref 37–153)
BUN/Creatinine Ratio: 12 (calc) (ref 6–22)
BUN: 26 mg/dL — ABNORMAL HIGH (ref 7–25)
CO2: 25 mmol/L (ref 20–32)
Calcium: 10.1 mg/dL (ref 8.6–10.4)
Chloride: 98 mmol/L (ref 98–110)
Creat: 2.16 mg/dL — ABNORMAL HIGH (ref 0.60–0.95)
Globulin: 2.4 g/dL (ref 1.9–3.7)
Glucose, Bld: 267 mg/dL — ABNORMAL HIGH (ref 65–99)
Potassium: 4.1 mmol/L (ref 3.5–5.3)
Sodium: 138 mmol/L (ref 135–146)
Total Bilirubin: 1.1 mg/dL (ref 0.2–1.2)
Total Protein: 6.6 g/dL (ref 6.1–8.1)
eGFR: 21 mL/min/{1.73_m2} — ABNORMAL LOW (ref >=60)

## 2024-05-17 LAB — VITAMIN B12: Vitamin B-12: 689 pg/mL (ref 200–1100)

## 2024-05-17 LAB — VITAMIN D 25 HYDROXY (VIT D DEFICIENCY, FRACTURES): Vit D, 25-Hydroxy: 25 ng/mL — ABNORMAL LOW (ref 30–100)

## 2024-05-17 LAB — TSH: TSH: >150 m[IU]/L — ABNORMAL HIGH (ref 0.40–4.50)

## 2024-05-17 LAB — HEMOGLOBIN A1C
Hgb A1c MFr Bld: 8.2 % — ABNORMAL HIGH (ref <5.7)
Mean Plasma Glucose: 189 mg/dL
eAG (mmol/L): 10.4 mmol/L

## 2024-05-17 MED ORDER — LEVOTHYROXINE SODIUM 112 MCG PO TABS: 112.0000 ug | ORAL_TABLET | Freq: Two times a day (BID) | ORAL | 0 refills | Status: AC

## 2024-05-17 MED ORDER — LEVOTHYROXINE SODIUM 112 MCG PO TABS: 112.0000 ug | ORAL_TABLET | Freq: Two times a day (BID) | ORAL | 0 refills | Status: DC

## 2024-05-17 NOTE — Telephone Encounter (Signed)
 Form located and put in Dr. Donnette Gal folder to sign.

## 2024-05-17 NOTE — Telephone Encounter (Signed)
 Copied from CRM [id number]. Topic: Clinical - Medication Question >> May 17, 2024 11:50 AM Melissa C wrote: Reason for CRM: Latoya from facility where patient is staying at called. She contacted regarding giving tylenol  PRN pain medication. I see it in patient's medication list, however I believe she is wanting to speak with the medical team about how often prn. Phone number is [phone] fax if needed is [phone]. Latoya stated she sent faxes to the office with this information and has not heard back yet

## 2024-05-22 ENCOUNTER — Other Ambulatory Visit: Payer: Self-pay | Admitting: Internal Medicine

## 2024-05-22 ENCOUNTER — Telehealth: Payer: Self-pay | Admitting: Internal Medicine

## 2024-05-22 NOTE — Telephone Encounter (Unsigned)
 Copied from CRM [id number]. Topic: Clinical - Medication Question >> May 22, 2024  2:02 PM Albertha Alosa wrote: Reason for CRM: [name] nurse manager called in regarding patient , stated patient thyroid  is low - wanted to know which thyroid  medication she should be taking , also has two vaccines that they received orders from would like to know what vaccines needed to be administered   [phone]

## 2024-05-23 ENCOUNTER — Ambulatory Visit: Payer: Medicare Other | Admitting: Internal Medicine

## 2024-05-24 ENCOUNTER — Ambulatory Visit: Payer: Self-pay

## 2024-05-24 NOTE — Telephone Encounter (Signed)
 Copied from CRM [id number]. Topic: Clinical - Medication Question >> May 22, 2024  2:02 PM Albertha Alosa wrote: Reason for CRM: [name] nurse manager called in regarding patient , stated patient thyroid  is low - wanted to know which thyroid  medication she should be taking , also has two vaccines that they received orders from would like to know what vaccines needed to be administered   [id number] >> May 24, 2024  1:04 PM Arm[place] J wrote: Erby Hatcher is calling back again due to a fax we sent over stating that the patient's thyroid  levels are dangerously low. She needs to know what thyroid  medication the patient is supposed to be taking because it looks like the patient has not been taking that medication.    Reason for Disposition  Caller has medicine question only, adult not sick, AND triager answers question  Answer Assessment - Initial Assessment Questions 1. NAME of MEDICINE: What medicine(s) are you calling about?     Levothyroxine   2. QUESTION: What is your question? (e.g., double dose of medicine, side effect)     What is her thyroid  medication  Protocols used: Medication Question Call-A-AH   FYI Only or Action Required?: FYI only for provider.  Patient was last seen in primary care on 05/16/2024 by Colene Dauphin, MD. Called Nurse Triage reporting medication question.   Triage Disposition: Information or Advice Only Call  Patient/caregiver understands and will follow disposition?: Yes

## 2024-05-28 NOTE — Telephone Encounter (Signed)
 Message left for [facility] today.  Patient should be taking 112 mcg of the Synthorid as instructed twice daily for one month.  Vaccine recommended was the Shingles vaccine which they should have an order for.

## 2024-05-31 DIAGNOSIS — R2689 Other abnormalities of gait and mobility: Secondary | ICD-10-CM | POA: Diagnosis not present

## 2024-05-31 DIAGNOSIS — M6281 Muscle weakness (generalized): Secondary | ICD-10-CM | POA: Diagnosis not present

## 2024-06-05 DIAGNOSIS — R2689 Other abnormalities of gait and mobility: Secondary | ICD-10-CM | POA: Diagnosis not present

## 2024-06-05 DIAGNOSIS — M6281 Muscle weakness (generalized): Secondary | ICD-10-CM | POA: Diagnosis not present

## 2024-06-11 DIAGNOSIS — R2689 Other abnormalities of gait and mobility: Secondary | ICD-10-CM | POA: Diagnosis not present

## 2024-06-11 DIAGNOSIS — M6281 Muscle weakness (generalized): Secondary | ICD-10-CM | POA: Diagnosis not present

## 2024-06-12 DIAGNOSIS — M6281 Muscle weakness (generalized): Secondary | ICD-10-CM | POA: Diagnosis not present

## 2024-06-12 DIAGNOSIS — R2689 Other abnormalities of gait and mobility: Secondary | ICD-10-CM | POA: Diagnosis not present

## 2024-06-14 DIAGNOSIS — R2689 Other abnormalities of gait and mobility: Secondary | ICD-10-CM | POA: Diagnosis not present

## 2024-06-14 DIAGNOSIS — M6281 Muscle weakness (generalized): Secondary | ICD-10-CM | POA: Diagnosis not present

## 2024-06-18 ENCOUNTER — Encounter

## 2024-06-21 DIAGNOSIS — R2689 Other abnormalities of gait and mobility: Secondary | ICD-10-CM | POA: Diagnosis not present

## 2024-06-21 DIAGNOSIS — M6281 Muscle weakness (generalized): Secondary | ICD-10-CM | POA: Diagnosis not present

## 2024-06-25 ENCOUNTER — Ambulatory Visit: Payer: Self-pay

## 2024-06-25 DIAGNOSIS — I495 Sick sinus syndrome: Secondary | ICD-10-CM

## 2024-06-26 ENCOUNTER — Telehealth: Payer: Self-pay

## 2024-06-26 DIAGNOSIS — R2689 Other abnormalities of gait and mobility: Secondary | ICD-10-CM | POA: Diagnosis not present

## 2024-06-26 DIAGNOSIS — M6281 Muscle weakness (generalized): Secondary | ICD-10-CM | POA: Diagnosis not present

## 2024-06-26 LAB — CUP PACEART REMOTE DEVICE CHECK
Battery Impedance: 2478 Ohm
Battery Remaining Longevity: 27 mo
Battery Voltage: 2.74 V
Brady Statistic AP VP Percent: 6 %
Brady Statistic AP VS Percent: 92 %
Brady Statistic AS VP Percent: 0 %
Brady Statistic AS VS Percent: 2 %
Date Time Interrogation Session: 20250720153450
Implantable Lead Connection Status: 753985
Implantable Lead Connection Status: 753985
Implantable Lead Implant Date: 20130801
Implantable Lead Implant Date: 20130801
Implantable Lead Location: 753859
Implantable Lead Location: 753860
Implantable Lead Model: 5076
Implantable Lead Model: 5092
Implantable Pulse Generator Implant Date: 20130801
Lead Channel Impedance Value: 512 Ohm
Lead Channel Impedance Value: 524 Ohm
Lead Channel Pacing Threshold Amplitude: 1.125 V
Lead Channel Pacing Threshold Amplitude: 1.625 V
Lead Channel Pacing Threshold Pulse Width: 0.4 ms
Lead Channel Pacing Threshold Pulse Width: 0.4 ms
Lead Channel Setting Pacing Amplitude: 2.25 V
Lead Channel Setting Pacing Amplitude: 3.25 V
Lead Channel Setting Pacing Pulse Width: 0.4 ms
Lead Channel Setting Sensing Sensitivity: 5.6 mV
Zone Setting Status: 755011
Zone Setting Status: 755011

## 2024-06-26 NOTE — Telephone Encounter (Signed)
 Copied from CRM [id number]. Topic: General - Other >> Jun 26, 2024 11:53 AM Franky GRADE wrote:  Reason for RMF:Izwpdz Alice from [place] [place] is calling to confirm if the [facility] documentation was received by the office faxed on 06/25/2024. Fax:[phone] Best call back number:[phone].

## 2024-06-28 DIAGNOSIS — R2689 Other abnormalities of gait and mobility: Secondary | ICD-10-CM | POA: Diagnosis not present

## 2024-06-28 DIAGNOSIS — M6281 Muscle weakness (generalized): Secondary | ICD-10-CM | POA: Diagnosis not present

## 2024-06-28 NOTE — Telephone Encounter (Signed)
 Karna called in to inquire if the form has been completed. Informed her of our 7-10 business day turn around time for pw.   Karna is requesting the pw be completed ASAP because she is trying to get the pt and spouse admitted to assistant living.   Fax:[phone]  Best call back number:[phone].

## 2024-06-28 NOTE — Telephone Encounter (Signed)
 Form faxed today with medication list and conformation received.

## 2024-06-28 NOTE — Telephone Encounter (Signed)
Form received yesterday

## 2024-07-01 ENCOUNTER — Ambulatory Visit: Payer: Self-pay | Admitting: Cardiovascular Disease

## 2024-07-03 DIAGNOSIS — M6281 Muscle weakness (generalized): Secondary | ICD-10-CM | POA: Diagnosis not present

## 2024-07-03 DIAGNOSIS — R2689 Other abnormalities of gait and mobility: Secondary | ICD-10-CM | POA: Diagnosis not present

## 2024-07-05 DIAGNOSIS — M6281 Muscle weakness (generalized): Secondary | ICD-10-CM | POA: Diagnosis not present

## 2024-07-05 DIAGNOSIS — R2689 Other abnormalities of gait and mobility: Secondary | ICD-10-CM | POA: Diagnosis not present

## 2024-07-06 DIAGNOSIS — M6281 Muscle weakness (generalized): Secondary | ICD-10-CM | POA: Diagnosis not present

## 2024-07-10 DIAGNOSIS — M6281 Muscle weakness (generalized): Secondary | ICD-10-CM | POA: Diagnosis not present

## 2024-07-11 DIAGNOSIS — M6281 Muscle weakness (generalized): Secondary | ICD-10-CM | POA: Diagnosis not present

## 2024-07-11 DIAGNOSIS — R2689 Other abnormalities of gait and mobility: Secondary | ICD-10-CM | POA: Diagnosis not present

## 2024-07-12 ENCOUNTER — Telehealth: Payer: Self-pay | Admitting: Internal Medicine

## 2024-07-12 DIAGNOSIS — I6930 Unspecified sequelae of cerebral infarction: Secondary | ICD-10-CM

## 2024-07-12 DIAGNOSIS — E1142 Type 2 diabetes mellitus with diabetic polyneuropathy: Secondary | ICD-10-CM

## 2024-07-12 DIAGNOSIS — M6281 Muscle weakness (generalized): Secondary | ICD-10-CM | POA: Diagnosis not present

## 2024-07-12 DIAGNOSIS — I739 Peripheral vascular disease, unspecified: Secondary | ICD-10-CM

## 2024-07-12 NOTE — Telephone Encounter (Signed)
 Printed

## 2024-07-12 NOTE — Telephone Encounter (Signed)
 Copied from CRM [id number]. Topic: General - Other >> Jul 12, 2024 11:44 AM Burnard DEL wrote: Reason for CRM: Patient son Camron Essman would like to know if an order could be written for her to receive a power lift recliner. He would ike to know if the order could be written for him to come pick up and he want to file a claim with medicare to get reimbursed because he brought the chair for her already. Please notify son when prescription is ready to be picked up.

## 2024-07-17 NOTE — Telephone Encounter (Signed)
 Message left for patient that order was ready for pick up and left up front.

## 2024-07-18 DIAGNOSIS — M6281 Muscle weakness (generalized): Secondary | ICD-10-CM | POA: Diagnosis not present

## 2024-07-18 DIAGNOSIS — R2689 Other abnormalities of gait and mobility: Secondary | ICD-10-CM | POA: Diagnosis not present

## 2024-07-19 DIAGNOSIS — M6281 Muscle weakness (generalized): Secondary | ICD-10-CM | POA: Diagnosis not present

## 2024-07-20 DIAGNOSIS — R2689 Other abnormalities of gait and mobility: Secondary | ICD-10-CM | POA: Diagnosis not present

## 2024-07-20 DIAGNOSIS — M6281 Muscle weakness (generalized): Secondary | ICD-10-CM | POA: Diagnosis not present

## 2024-07-24 DIAGNOSIS — M6281 Muscle weakness (generalized): Secondary | ICD-10-CM | POA: Diagnosis not present

## 2024-07-25 DIAGNOSIS — M6281 Muscle weakness (generalized): Secondary | ICD-10-CM | POA: Diagnosis not present

## 2024-07-25 DIAGNOSIS — R2689 Other abnormalities of gait and mobility: Secondary | ICD-10-CM | POA: Diagnosis not present

## 2024-07-26 DIAGNOSIS — M6281 Muscle weakness (generalized): Secondary | ICD-10-CM | POA: Diagnosis not present

## 2024-07-27 DIAGNOSIS — M6281 Muscle weakness (generalized): Secondary | ICD-10-CM | POA: Diagnosis not present

## 2024-07-27 DIAGNOSIS — R2689 Other abnormalities of gait and mobility: Secondary | ICD-10-CM | POA: Diagnosis not present

## 2024-07-31 ENCOUNTER — Other Ambulatory Visit: Payer: Self-pay | Admitting: Internal Medicine

## 2024-07-31 DIAGNOSIS — M6281 Muscle weakness (generalized): Secondary | ICD-10-CM | POA: Diagnosis not present

## 2024-08-01 DIAGNOSIS — M6281 Muscle weakness (generalized): Secondary | ICD-10-CM | POA: Diagnosis not present

## 2024-08-01 DIAGNOSIS — R2689 Other abnormalities of gait and mobility: Secondary | ICD-10-CM | POA: Diagnosis not present

## 2024-08-02 DIAGNOSIS — M6281 Muscle weakness (generalized): Secondary | ICD-10-CM | POA: Diagnosis not present

## 2024-08-03 DIAGNOSIS — M6281 Muscle weakness (generalized): Secondary | ICD-10-CM | POA: Diagnosis not present

## 2024-08-03 DIAGNOSIS — R2689 Other abnormalities of gait and mobility: Secondary | ICD-10-CM | POA: Diagnosis not present

## 2024-08-07 DIAGNOSIS — M6281 Muscle weakness (generalized): Secondary | ICD-10-CM | POA: Diagnosis not present

## 2024-08-08 DIAGNOSIS — R2689 Other abnormalities of gait and mobility: Secondary | ICD-10-CM | POA: Diagnosis not present

## 2024-08-08 DIAGNOSIS — M6281 Muscle weakness (generalized): Secondary | ICD-10-CM | POA: Diagnosis not present

## 2024-08-09 DIAGNOSIS — M6281 Muscle weakness (generalized): Secondary | ICD-10-CM | POA: Diagnosis not present

## 2024-08-10 DIAGNOSIS — R2689 Other abnormalities of gait and mobility: Secondary | ICD-10-CM | POA: Diagnosis not present

## 2024-08-10 DIAGNOSIS — M6281 Muscle weakness (generalized): Secondary | ICD-10-CM | POA: Diagnosis not present

## 2024-08-14 DIAGNOSIS — M6281 Muscle weakness (generalized): Secondary | ICD-10-CM | POA: Diagnosis not present

## 2024-08-15 DIAGNOSIS — R2689 Other abnormalities of gait and mobility: Secondary | ICD-10-CM | POA: Diagnosis not present

## 2024-08-15 DIAGNOSIS — M6281 Muscle weakness (generalized): Secondary | ICD-10-CM | POA: Diagnosis not present

## 2024-08-16 DIAGNOSIS — M6281 Muscle weakness (generalized): Secondary | ICD-10-CM | POA: Diagnosis not present

## 2024-08-21 DIAGNOSIS — M6281 Muscle weakness (generalized): Secondary | ICD-10-CM | POA: Diagnosis not present

## 2024-08-22 DIAGNOSIS — R2689 Other abnormalities of gait and mobility: Secondary | ICD-10-CM | POA: Diagnosis not present

## 2024-08-22 DIAGNOSIS — M6281 Muscle weakness (generalized): Secondary | ICD-10-CM | POA: Diagnosis not present

## 2024-08-23 DIAGNOSIS — M6281 Muscle weakness (generalized): Secondary | ICD-10-CM | POA: Diagnosis not present

## 2024-08-24 DIAGNOSIS — M6281 Muscle weakness (generalized): Secondary | ICD-10-CM | POA: Diagnosis not present

## 2024-08-24 DIAGNOSIS — R2689 Other abnormalities of gait and mobility: Secondary | ICD-10-CM | POA: Diagnosis not present

## 2024-08-28 ENCOUNTER — Telehealth: Payer: Self-pay

## 2024-08-28 DIAGNOSIS — M6281 Muscle weakness (generalized): Secondary | ICD-10-CM | POA: Diagnosis not present

## 2024-08-28 NOTE — Telephone Encounter (Unsigned)
 Copied from CRM [id number]. Topic: Clinical - Medication Question >> Aug 28, 2024 10:14 AM Martinique E wrote: Reason for CRM: Patient's spouse, Elsie, called in requesting that Dr. Geofm cancel the order where patient had to have her food cut up. Callback number for patient's spouse is [phone].

## 2024-08-29 DIAGNOSIS — M6281 Muscle weakness (generalized): Secondary | ICD-10-CM | POA: Diagnosis not present

## 2024-08-29 DIAGNOSIS — R2689 Other abnormalities of gait and mobility: Secondary | ICD-10-CM | POA: Diagnosis not present

## 2024-08-29 DIAGNOSIS — Z23 Encounter for immunization: Secondary | ICD-10-CM | POA: Diagnosis not present

## 2024-08-30 DIAGNOSIS — M6281 Muscle weakness (generalized): Secondary | ICD-10-CM | POA: Diagnosis not present

## 2024-08-30 NOTE — Telephone Encounter (Signed)
 Message left for Elsie, son to call me back and let me know if we can d/c the request to change food status request.

## 2024-08-30 NOTE — Telephone Encounter (Signed)
 Can you please call one of her sons and see what's this about --- she is at risk of choking and aspiration so that makes me a little nervous to cancel that order.

## 2024-08-31 ENCOUNTER — Telehealth: Payer: Self-pay | Admitting: Radiology

## 2024-08-31 DIAGNOSIS — M6281 Muscle weakness (generalized): Secondary | ICD-10-CM | POA: Diagnosis not present

## 2024-08-31 DIAGNOSIS — R2689 Other abnormalities of gait and mobility: Secondary | ICD-10-CM | POA: Diagnosis not present

## 2024-08-31 NOTE — Telephone Encounter (Signed)
 Copied from CRM [id number]. Topic: Clinical - Medication Question >> Aug 28, 2024 10:14 AM Martinique E wrote: Reason for CRM: Patient's spouse, Elsie, called in requesting that Dr. Geofm cancel the order where patient had to have her food cut up. Callback number for patient's spouse is [phone]. >> Aug 31, 2024 11:10 AM Mia F wrote: Pt husband Elsie is calling back about this request. He is asking if pt can no longer have her food cut up. He states she is no longer ar risk of choking.

## 2024-08-31 NOTE — Telephone Encounter (Signed)
 Message was left for patient's son to return call to clinic and verify because she is at risk for choking otherwise.

## 2024-09-03 NOTE — Telephone Encounter (Unsigned)
 Copied from CRM #[id number]. Topic: General - Call Back - No Documentation >> Sep 03, 2024 10:58 AM Precious C wrote: Reason for CRM: Pt spouse called back to check on status of request for pt food to be cut up... please follow up with pt's son to let him know what to advise to his parents moving forward, callback is [phone]

## 2024-09-04 DIAGNOSIS — M6281 Muscle weakness (generalized): Secondary | ICD-10-CM | POA: Diagnosis not present

## 2024-09-04 NOTE — Telephone Encounter (Signed)
 Message left for son today again to return call to clinic to discuss.  If he calls back please verify that it is okay to change the orders.  This must come from the son and not the husband due to patient's risk for choking.

## 2024-09-04 NOTE — Telephone Encounter (Unsigned)
 Copied from CRM #[id number]. Topic: General - Call Back - No Documentation >> Sep 04, 2024  1:16 PM Alfonso HERO wrote: Reason for CRM: Patient son called back and said not to change the orders. Patients food needs to continue being chopped up.

## 2024-09-05 DIAGNOSIS — R2689 Other abnormalities of gait and mobility: Secondary | ICD-10-CM | POA: Diagnosis not present

## 2024-09-05 DIAGNOSIS — M6281 Muscle weakness (generalized): Secondary | ICD-10-CM | POA: Diagnosis not present

## 2024-09-05 NOTE — Telephone Encounter (Signed)
 Noted.  No changes to original orders.

## 2024-09-05 NOTE — Telephone Encounter (Signed)
 Pt's son called and requested to speak with carla to discuss an upcoming physical exam the patient has. He chose not to elaborate and preferred to speak with [facility]. Please call & advise with Elsie.

## 2024-09-06 ENCOUNTER — Ambulatory Visit: Payer: Self-pay | Admitting: Internal Medicine

## 2024-09-06 DIAGNOSIS — M6281 Muscle weakness (generalized): Secondary | ICD-10-CM | POA: Diagnosis not present

## 2024-09-06 NOTE — Telephone Encounter (Signed)
 Message has already been addressed.  We will not be changing orders for food diet.

## 2024-09-06 NOTE — Telephone Encounter (Signed)
 FYI Only or Action Required?: FYI only for provider.  Patient was last seen in primary care on 05/16/2024 by Geofm Glade PARAS, MD.  Called Nurse Triage reporting Advice Only.  Triage Disposition: Information or Advice Only Call  Patient/caregiver understands and will follow disposition?: Yes         Copied from CRM #[id number]. Topic: Clinical - Red Word Triage >> Sep 06, 2024  9:11 AM [name] wrote: [facility] that prompted transfer to Nurse Triage: Patients husband states patients wife lost her pacemaker for her heart and needs another one. Reason for Disposition  Health information question, no triage required and triager able to answer question  Answer Assessment - Initial Assessment Questions This RN spoke with pt's husband, Janet Steele. Pt's husband states the pt needs a new regulator for her pacemaker. This RN let pt husband know to call pt's cardiology office as this is primary care. Pt husband states he will call them now. All questions answered at this time.  Protocols used: Information Only Call - No Triage-A-AH

## 2024-09-06 NOTE — Telephone Encounter (Signed)
 Copied from CRM [id number]. Topic: Clinical - Medical Advice >> Sep 06, 2024 11:47 AM Alfonso ORN wrote: Reason for CRM: Pt husband called on behalf of wife. She is wanting to stop current diet (cutting up foods) discussed by pcp as she feels it isnt necessary.Please call back to advise.

## 2024-09-07 DIAGNOSIS — R2689 Other abnormalities of gait and mobility: Secondary | ICD-10-CM | POA: Diagnosis not present

## 2024-09-07 DIAGNOSIS — M6281 Muscle weakness (generalized): Secondary | ICD-10-CM | POA: Diagnosis not present

## 2024-09-10 NOTE — Progress Notes (Signed)
 Remote PPM Transmission

## 2024-09-11 DIAGNOSIS — M6281 Muscle weakness (generalized): Secondary | ICD-10-CM | POA: Diagnosis not present

## 2024-09-12 DIAGNOSIS — R2689 Other abnormalities of gait and mobility: Secondary | ICD-10-CM | POA: Diagnosis not present

## 2024-09-12 DIAGNOSIS — M6281 Muscle weakness (generalized): Secondary | ICD-10-CM | POA: Diagnosis not present

## 2024-09-13 DIAGNOSIS — M6281 Muscle weakness (generalized): Secondary | ICD-10-CM | POA: Diagnosis not present

## 2024-09-13 NOTE — Telephone Encounter (Signed)
 Copied from CRM [id number]. Topic: General - Other >> Sep 13, 2024  9:59 AM Vena HERO wrote: Reason for CRM: wants to cancel food service, cell service is bad but would like a nurse to call back at [phone]

## 2024-09-13 NOTE — Telephone Encounter (Signed)
 Food orders will not be changed

## 2024-09-14 DIAGNOSIS — R2689 Other abnormalities of gait and mobility: Secondary | ICD-10-CM | POA: Diagnosis not present

## 2024-09-14 DIAGNOSIS — M6281 Muscle weakness (generalized): Secondary | ICD-10-CM | POA: Diagnosis not present

## 2024-09-17 NOTE — Telephone Encounter (Signed)
 Food order will not be changed per Dr. Geofm due to choking hazzard.

## 2024-09-17 NOTE — Telephone Encounter (Unsigned)
 Copied from CRM [id number]. Topic: General - Call Back - No Documentation >> Sep 17, 2024 11:00 AM Precious C wrote: Reason for CRM: Pt's husband Janet Steele called in requesting that his wife's food does NOT get cut up, stated that they would like to cancel having it cut up cell service is bad but would like a nurse to call back at [phone]

## 2024-09-18 DIAGNOSIS — M6281 Muscle weakness (generalized): Secondary | ICD-10-CM | POA: Diagnosis not present

## 2024-09-19 DIAGNOSIS — M6281 Muscle weakness (generalized): Secondary | ICD-10-CM | POA: Diagnosis not present

## 2024-09-19 DIAGNOSIS — R2689 Other abnormalities of gait and mobility: Secondary | ICD-10-CM | POA: Diagnosis not present

## 2024-09-20 DIAGNOSIS — M6281 Muscle weakness (generalized): Secondary | ICD-10-CM | POA: Diagnosis not present

## 2024-09-21 DIAGNOSIS — R2689 Other abnormalities of gait and mobility: Secondary | ICD-10-CM | POA: Diagnosis not present

## 2024-09-21 DIAGNOSIS — M6281 Muscle weakness (generalized): Secondary | ICD-10-CM | POA: Diagnosis not present

## 2024-09-24 ENCOUNTER — Ambulatory Visit (INDEPENDENT_AMBULATORY_CARE_PROVIDER_SITE_OTHER): Payer: Self-pay

## 2024-09-24 DIAGNOSIS — I495 Sick sinus syndrome: Secondary | ICD-10-CM | POA: Diagnosis not present

## 2024-09-25 LAB — CUP PACEART REMOTE DEVICE CHECK
Battery Impedance: 2612 Ohm
Battery Remaining Longevity: 22 mo
Battery Voltage: 2.73 V
Brady Statistic AP VP Percent: 5 %
Brady Statistic AP VS Percent: 92 %
Brady Statistic AS VP Percent: 0 %
Brady Statistic AS VS Percent: 2 %
Date Time Interrogation Session: 20251020172438
Implantable Lead Connection Status: 753985
Implantable Lead Connection Status: 753985
Implantable Lead Implant Date: 20130801
Implantable Lead Implant Date: 20130801
Implantable Lead Location: 753859
Implantable Lead Location: 753860
Implantable Lead Model: 5076
Implantable Lead Model: 5092
Implantable Pulse Generator Implant Date: 20130801
Lead Channel Impedance Value: 546 Ohm
Lead Channel Impedance Value: 601 Ohm
Lead Channel Pacing Threshold Amplitude: 1.5 V
Lead Channel Pacing Threshold Amplitude: 1.5 V
Lead Channel Pacing Threshold Pulse Width: 0.4 ms
Lead Channel Pacing Threshold Pulse Width: 0.4 ms
Lead Channel Setting Pacing Amplitude: 3 V
Lead Channel Setting Pacing Amplitude: 3 V
Lead Channel Setting Pacing Pulse Width: 0.4 ms
Lead Channel Setting Sensing Sensitivity: 5.6 mV
Zone Setting Status: 755011
Zone Setting Status: 755011

## 2024-09-26 DIAGNOSIS — R2689 Other abnormalities of gait and mobility: Secondary | ICD-10-CM | POA: Diagnosis not present

## 2024-09-26 DIAGNOSIS — M6281 Muscle weakness (generalized): Secondary | ICD-10-CM | POA: Diagnosis not present

## 2024-09-27 DIAGNOSIS — M6281 Muscle weakness (generalized): Secondary | ICD-10-CM | POA: Diagnosis not present

## 2024-09-28 ENCOUNTER — Telehealth: Payer: Self-pay

## 2024-09-28 DIAGNOSIS — M6281 Muscle weakness (generalized): Secondary | ICD-10-CM | POA: Diagnosis not present

## 2024-09-28 NOTE — Telephone Encounter (Signed)
 All paperwork for Janet Steele has already been faxed back and sent to scan.

## 2024-09-28 NOTE — Telephone Encounter (Signed)
 Copied from CRM [id number]. Topic: General - Other >> Sep 28, 2024  9:08 AM [name] wrote: Reason for CRM: Patients son calling for Janet Steele should have received an amended care plan from [facility], if we need to discuss you can reach [place] at [phone]

## 2024-09-28 NOTE — Progress Notes (Signed)
 Remote PPM Transmission

## 2024-10-01 DIAGNOSIS — M6281 Muscle weakness (generalized): Secondary | ICD-10-CM | POA: Diagnosis not present

## 2024-10-03 ENCOUNTER — Ambulatory Visit: Payer: Self-pay | Admitting: Cardiovascular Disease

## 2024-10-03 DIAGNOSIS — R2689 Other abnormalities of gait and mobility: Secondary | ICD-10-CM | POA: Diagnosis not present

## 2024-10-03 DIAGNOSIS — M6281 Muscle weakness (generalized): Secondary | ICD-10-CM | POA: Diagnosis not present

## 2024-10-05 DIAGNOSIS — R2689 Other abnormalities of gait and mobility: Secondary | ICD-10-CM | POA: Diagnosis not present

## 2024-10-05 DIAGNOSIS — M6281 Muscle weakness (generalized): Secondary | ICD-10-CM | POA: Diagnosis not present

## 2024-10-09 DIAGNOSIS — M6281 Muscle weakness (generalized): Secondary | ICD-10-CM | POA: Diagnosis not present

## 2024-10-10 DIAGNOSIS — R2689 Other abnormalities of gait and mobility: Secondary | ICD-10-CM | POA: Diagnosis not present

## 2024-10-10 DIAGNOSIS — M6281 Muscle weakness (generalized): Secondary | ICD-10-CM | POA: Diagnosis not present

## 2024-10-11 DIAGNOSIS — M6281 Muscle weakness (generalized): Secondary | ICD-10-CM | POA: Diagnosis not present

## 2024-10-15 DIAGNOSIS — M6281 Muscle weakness (generalized): Secondary | ICD-10-CM | POA: Diagnosis not present

## 2024-10-15 DIAGNOSIS — R2689 Other abnormalities of gait and mobility: Secondary | ICD-10-CM | POA: Diagnosis not present

## 2024-10-18 DIAGNOSIS — M6281 Muscle weakness (generalized): Secondary | ICD-10-CM | POA: Diagnosis not present

## 2024-10-19 DIAGNOSIS — M6281 Muscle weakness (generalized): Secondary | ICD-10-CM | POA: Diagnosis not present

## 2024-10-19 DIAGNOSIS — R2689 Other abnormalities of gait and mobility: Secondary | ICD-10-CM | POA: Diagnosis not present

## 2024-10-22 DIAGNOSIS — R2689 Other abnormalities of gait and mobility: Secondary | ICD-10-CM | POA: Diagnosis not present

## 2024-10-22 DIAGNOSIS — M6281 Muscle weakness (generalized): Secondary | ICD-10-CM | POA: Diagnosis not present

## 2024-10-23 DIAGNOSIS — M6281 Muscle weakness (generalized): Secondary | ICD-10-CM | POA: Diagnosis not present

## 2024-10-26 DIAGNOSIS — R2689 Other abnormalities of gait and mobility: Secondary | ICD-10-CM | POA: Diagnosis not present

## 2024-10-26 DIAGNOSIS — M6281 Muscle weakness (generalized): Secondary | ICD-10-CM | POA: Diagnosis not present

## 2024-10-30 DIAGNOSIS — M6281 Muscle weakness (generalized): Secondary | ICD-10-CM | POA: Diagnosis not present

## 2024-11-05 DIAGNOSIS — R2689 Other abnormalities of gait and mobility: Secondary | ICD-10-CM | POA: Diagnosis not present

## 2024-11-05 DIAGNOSIS — M6281 Muscle weakness (generalized): Secondary | ICD-10-CM | POA: Diagnosis not present

## 2024-11-06 DIAGNOSIS — M6281 Muscle weakness (generalized): Secondary | ICD-10-CM | POA: Diagnosis not present

## 2024-11-09 DIAGNOSIS — M6281 Muscle weakness (generalized): Secondary | ICD-10-CM | POA: Diagnosis not present

## 2024-11-09 DIAGNOSIS — R2689 Other abnormalities of gait and mobility: Secondary | ICD-10-CM | POA: Diagnosis not present

## 2024-11-12 DIAGNOSIS — R2689 Other abnormalities of gait and mobility: Secondary | ICD-10-CM | POA: Diagnosis not present

## 2024-11-12 DIAGNOSIS — M6281 Muscle weakness (generalized): Secondary | ICD-10-CM | POA: Diagnosis not present

## 2024-11-14 DIAGNOSIS — M6281 Muscle weakness (generalized): Secondary | ICD-10-CM | POA: Diagnosis not present

## 2024-11-14 DIAGNOSIS — R2689 Other abnormalities of gait and mobility: Secondary | ICD-10-CM | POA: Diagnosis not present

## 2024-11-18 NOTE — Patient Instructions (Addendum)
° ° ° ° °  Blood work was ordered.       Medications changes include :   None    A referral was ordered and someone will call you to schedule an appointment.     Return in about 6 months (around 05/20/2025) for follow up.

## 2024-11-18 NOTE — Progress Notes (Signed)
 "     Subjective:    Patient ID: Janet Steele, female    DOB: 1929-05-16, 89 y.o.   MRN: [id number]     HPI Janet Steele is here for follow up of her chronic medical problems.    Medications and allergies reviewed with patient and updated if appropriate.  Medications Ordered Prior to Encounter[1]   Review of Systems     Objective:  There were no vitals filed for this visit. BP Readings from Last 3 Encounters:  05/16/24 112/62  05/16/24 112/62  04/27/24 (!) 117/101   Wt Readings from Last 3 Encounters:  05/16/24 115 lb (52.2 kg)  05/16/24 115 lb (52.2 kg)  04/27/24 115 lb (52.2 kg)   There is no height or weight on file to calculate BMI.    Physical Exam     Lab Results  Component Value Date   WBC 8.1 05/16/2024   HGB 10.6 (L) 05/16/2024   HCT 34.3 (L) 05/16/2024   PLT 215 05/16/2024   GLUCOSE 267 (H) 05/16/2024   CHOL 223 (H) 05/16/2024   TRIG 228 (H) 05/16/2024   HDL 50 05/16/2024   LDLDIRECT 254.0 11/01/2017   LDLCALC 136 (H) 05/16/2024   ALT 10 05/16/2024   AST 19 05/16/2024   NA 138 05/16/2024   K 4.1 05/16/2024   CL 98 05/16/2024   CREATININE 2.16 (H) 05/16/2024   BUN 26 (H) 05/16/2024   CO2 25 05/16/2024   TSH >150.00 (H) 05/16/2024   INR 0.99 04/04/2018   HGBA1C 8.2 (H) 05/16/2024   MICROALBUR 3.6 (H) 05/28/2010     Assessment & Plan:    See Problem List for Assessment and Plan of chronic medical problems.        [1] Current Outpatient Medications on File Prior to Visit  Medication Sig Dispense Refill   acetaminophen  (TYLENOL ) 325 MG tablet TAKE 2 TABS (650MG  DOSE) BY MOUTH EVERY 6 HOURS AS NEEDED FOR PAIN 60 tablet 11   albuterol  (PROVENTIL ) (2.5 MG/3ML) 0.083% nebulizer solution Take 3 mLs (2.5 mg total) by nebulization every 12 (twelve) hours as needed for wheezing or shortness of breath. 150 mL 3   ASPIRIN  LOW DOSE 81 MG chewable tablet GIVE 1 TAB BY MOUTH ONCE DAILY 30 tablet 11   atorvastatin  (LIPITOR ) 80 MG tablet GIVE 1  TAB BY MOUTH ONCE DAILY F/U APPT DUE IN SEPT FOR FUTURE REFILLS 30 tablet 3   clopidogrel  (PLAVIX ) 75 MG tablet TAKE 1 TABLET (75 MG TOTAL) BY MOUTH DAILY. F/U APPT DUE IN SEPT FOR FUTURE REFILLS 30 tablet 11   donepezil  (ARICEPT ) 5 MG tablet Take 1 tablet (5 mg total) by mouth at bedtime. 30 tablet 11   DULoxetine  (CYMBALTA ) 60 MG capsule GIVE 1 CAP BY MOUTH ONCE DAILY F/U APPT DUE IN SEPT FOR FUTURE REFILLS 30 capsule 11   glimepiride  (AMARYL ) 1 MG tablet TAKE 1 TABLET (1 MG TOTAL) BY MOUTH DAILY WITH BREAKFAST. F/U APPT DUE IN SEPT FOR FUTURE REFILLS 30 tablet 11   levothyroxine  (SYNTHROID ) 112 MCG tablet Take 1 tablet (112 mcg total) by mouth 2 (two) times daily. 60 tablet 0   meclizine  (ANTIVERT ) 25 MG tablet Take 1 tablet (25 mg total) by mouth 3 (three) times daily as needed for dizziness. 30 tablet 0   metoprolol  tartrate (LOPRESSOR ) 25 MG tablet TAKE 1 TAB BY MOUTH TWICE DAILY 60 tablet 10   nitroGLYCERIN  (NITROSTAT ) 0.4 MG SL tablet TAKE 1 TAB SUBLINGUALLY EVERY 5 MINUTES FOR 3 DOSES AS  NEEDED FOR CHEST PAIN. IF NO RELIEF CALL MD. 25 tablet 5   OLANZapine  (ZYPREXA ) 5 MG tablet TAKE DAILY AT 4PM 30 tablet 3   ondansetron  (ZOFRAN -ODT) 4 MG disintegrating tablet TAKE 1 TAB BY MOUTH EVERY 8 HOURS AS NEEDED FOR NAUSEA 30 tablet 11   pantoprazole  (PROTONIX ) 40 MG tablet TAKE 1 TAB (40MG ) BY MOUTH ONCE DAILY 30 tablet 11   promethazine  (PHENERGAN ) 25 MG tablet Take 1 tablet (25 mg total) by mouth every 6 (six) hours as needed for nausea or vomiting. 30 tablet 0   SYMBICORT  80-4.5 MCG/ACT inhaler INHALE 2 PUFFS BY MOUTH 2 TIMES A DAY 10.2 g 0   No current facility-administered medications on file prior to visit.  This encounter was created in error - please disregard. "

## 2024-11-19 ENCOUNTER — Encounter: Admitting: Internal Medicine

## 2024-11-19 DIAGNOSIS — R2689 Other abnormalities of gait and mobility: Secondary | ICD-10-CM | POA: Diagnosis not present

## 2024-11-19 DIAGNOSIS — M6281 Muscle weakness (generalized): Secondary | ICD-10-CM | POA: Diagnosis not present

## 2024-11-20 DIAGNOSIS — M6281 Muscle weakness (generalized): Secondary | ICD-10-CM | POA: Diagnosis not present

## 2024-12-12 ENCOUNTER — Emergency Department (HOSPITAL_COMMUNITY)

## 2024-12-12 ENCOUNTER — Other Ambulatory Visit: Payer: Self-pay | Admitting: Internal Medicine

## 2024-12-12 ENCOUNTER — Inpatient Hospital Stay (HOSPITAL_COMMUNITY)
Admission: EM | Admit: 2024-12-12 | Discharge: 2024-12-18 | DRG: 193 | Disposition: A | Discharge status: Skilled Nursing Facility | Source: Ambulatory Visit | Attending: Internal Medicine | Admitting: Internal Medicine

## 2024-12-12 ENCOUNTER — Encounter (HOSPITAL_COMMUNITY): Payer: Self-pay | Admitting: Internal Medicine

## 2024-12-12 DIAGNOSIS — J9601 Acute respiratory failure with hypoxia: Secondary | ICD-10-CM | POA: Diagnosis present

## 2024-12-12 DIAGNOSIS — I252 Old myocardial infarction: Secondary | ICD-10-CM

## 2024-12-12 DIAGNOSIS — J441 Chronic obstructive pulmonary disease with (acute) exacerbation: Secondary | ICD-10-CM | POA: Diagnosis present

## 2024-12-12 DIAGNOSIS — J189 Pneumonia, unspecified organism: Principal | ICD-10-CM | POA: Insufficient documentation

## 2024-12-12 DIAGNOSIS — N184 Chronic kidney disease, stage 4 (severe): Secondary | ICD-10-CM | POA: Diagnosis present

## 2024-12-12 DIAGNOSIS — Z860101 Personal history of adenomatous and serrated colon polyps: Secondary | ICD-10-CM

## 2024-12-12 DIAGNOSIS — Z951 Presence of aortocoronary bypass graft: Secondary | ICD-10-CM

## 2024-12-12 DIAGNOSIS — Z882 Allergy status to sulfonamides status: Secondary | ICD-10-CM

## 2024-12-12 DIAGNOSIS — Z881 Allergy status to other antibiotic agents status: Secondary | ICD-10-CM

## 2024-12-12 DIAGNOSIS — D649 Anemia, unspecified: Secondary | ICD-10-CM | POA: Diagnosis present

## 2024-12-12 DIAGNOSIS — Z885 Allergy status to narcotic agent status: Secondary | ICD-10-CM

## 2024-12-12 DIAGNOSIS — Z880 Allergy status to penicillin: Secondary | ICD-10-CM

## 2024-12-12 DIAGNOSIS — I5023 Acute on chronic systolic (congestive) heart failure: Secondary | ICD-10-CM | POA: Diagnosis not present

## 2024-12-12 DIAGNOSIS — N179 Acute kidney failure, unspecified: Secondary | ICD-10-CM | POA: Diagnosis present

## 2024-12-12 DIAGNOSIS — J440 Chronic obstructive pulmonary disease with acute lower respiratory infection: Secondary | ICD-10-CM | POA: Diagnosis present

## 2024-12-12 DIAGNOSIS — Z7984 Long term (current) use of oral hypoglycemic drugs: Secondary | ICD-10-CM

## 2024-12-12 DIAGNOSIS — I2510 Atherosclerotic heart disease of native coronary artery without angina pectoris: Secondary | ICD-10-CM | POA: Diagnosis present

## 2024-12-12 DIAGNOSIS — I255 Ischemic cardiomyopathy: Secondary | ICD-10-CM | POA: Diagnosis present

## 2024-12-12 DIAGNOSIS — E1140 Type 2 diabetes mellitus with diabetic neuropathy, unspecified: Secondary | ICD-10-CM | POA: Diagnosis present

## 2024-12-12 DIAGNOSIS — R918 Other nonspecific abnormal finding of lung field: Secondary | ICD-10-CM | POA: Insufficient documentation

## 2024-12-12 DIAGNOSIS — Z7951 Long term (current) use of inhaled steroids: Secondary | ICD-10-CM

## 2024-12-12 DIAGNOSIS — Z87891 Personal history of nicotine dependence: Secondary | ICD-10-CM

## 2024-12-12 DIAGNOSIS — F03918 Unspecified dementia, unspecified severity, with other behavioral disturbance: Secondary | ICD-10-CM | POA: Diagnosis present

## 2024-12-12 DIAGNOSIS — Z8673 Personal history of transient ischemic attack (TIA), and cerebral infarction without residual deficits: Secondary | ICD-10-CM

## 2024-12-12 DIAGNOSIS — Z79899 Other long term (current) drug therapy: Secondary | ICD-10-CM

## 2024-12-12 DIAGNOSIS — Z888 Allergy status to other drugs, medicaments and biological substances status: Secondary | ICD-10-CM

## 2024-12-12 DIAGNOSIS — H9190 Unspecified hearing loss, unspecified ear: Secondary | ICD-10-CM | POA: Diagnosis present

## 2024-12-12 DIAGNOSIS — J159 Unspecified bacterial pneumonia: Secondary | ICD-10-CM | POA: Diagnosis present

## 2024-12-12 DIAGNOSIS — K219 Gastro-esophageal reflux disease without esophagitis: Secondary | ICD-10-CM | POA: Diagnosis present

## 2024-12-12 DIAGNOSIS — I200 Unstable angina: Secondary | ICD-10-CM

## 2024-12-12 DIAGNOSIS — E1151 Type 2 diabetes mellitus with diabetic peripheral angiopathy without gangrene: Secondary | ICD-10-CM | POA: Diagnosis present

## 2024-12-12 DIAGNOSIS — I509 Heart failure, unspecified: Secondary | ICD-10-CM

## 2024-12-12 DIAGNOSIS — Z1152 Encounter for screening for COVID-19: Secondary | ICD-10-CM

## 2024-12-12 DIAGNOSIS — Z66 Do not resuscitate: Secondary | ICD-10-CM | POA: Diagnosis present

## 2024-12-12 DIAGNOSIS — Z79890 Hormone replacement therapy (postmenopausal): Secondary | ICD-10-CM

## 2024-12-12 DIAGNOSIS — E785 Hyperlipidemia, unspecified: Secondary | ICD-10-CM | POA: Diagnosis present

## 2024-12-12 DIAGNOSIS — I130 Hypertensive heart and chronic kidney disease with heart failure and stage 1 through stage 4 chronic kidney disease, or unspecified chronic kidney disease: Secondary | ICD-10-CM | POA: Diagnosis present

## 2024-12-12 DIAGNOSIS — J1008 Influenza due to other identified influenza virus with other specified pneumonia: Principal | ICD-10-CM | POA: Diagnosis present

## 2024-12-12 DIAGNOSIS — I495 Sick sinus syndrome: Secondary | ICD-10-CM | POA: Diagnosis present

## 2024-12-12 DIAGNOSIS — D631 Anemia in chronic kidney disease: Secondary | ICD-10-CM | POA: Diagnosis present

## 2024-12-12 DIAGNOSIS — F32A Depression, unspecified: Secondary | ICD-10-CM | POA: Diagnosis present

## 2024-12-12 DIAGNOSIS — Z8249 Family history of ischemic heart disease and other diseases of the circulatory system: Secondary | ICD-10-CM

## 2024-12-12 DIAGNOSIS — F0393 Unspecified dementia, unspecified severity, with mood disturbance: Secondary | ICD-10-CM | POA: Diagnosis present

## 2024-12-12 DIAGNOSIS — J09X1 Influenza due to identified novel influenza A virus with pneumonia: Secondary | ICD-10-CM | POA: Insufficient documentation

## 2024-12-12 DIAGNOSIS — Z7982 Long term (current) use of aspirin: Secondary | ICD-10-CM

## 2024-12-12 DIAGNOSIS — I25700 Atherosclerosis of coronary artery bypass graft(s), unspecified, with unstable angina pectoris: Secondary | ICD-10-CM

## 2024-12-12 DIAGNOSIS — I25810 Atherosclerosis of coronary artery bypass graft(s) without angina pectoris: Secondary | ICD-10-CM | POA: Diagnosis present

## 2024-12-12 DIAGNOSIS — Z955 Presence of coronary angioplasty implant and graft: Secondary | ICD-10-CM

## 2024-12-12 DIAGNOSIS — Z7902 Long term (current) use of antithrombotics/antiplatelets: Secondary | ICD-10-CM

## 2024-12-12 DIAGNOSIS — K449 Diaphragmatic hernia without obstruction or gangrene: Secondary | ICD-10-CM | POA: Diagnosis present

## 2024-12-12 DIAGNOSIS — E1122 Type 2 diabetes mellitus with diabetic chronic kidney disease: Secondary | ICD-10-CM | POA: Diagnosis present

## 2024-12-12 DIAGNOSIS — E039 Hypothyroidism, unspecified: Secondary | ICD-10-CM | POA: Diagnosis present

## 2024-12-12 DIAGNOSIS — E1165 Type 2 diabetes mellitus with hyperglycemia: Secondary | ICD-10-CM | POA: Diagnosis present

## 2024-12-12 DIAGNOSIS — Z950 Presence of cardiac pacemaker: Secondary | ICD-10-CM

## 2024-12-12 LAB — BLOOD GAS, VENOUS
Acid-base deficit: 1.4 mmol/L (ref 0.0–2.0)
Bicarbonate: 25.2 mmol/L (ref 20.0–28.0)
O2 Saturation: 90 %
Patient temperature: 37
pCO2, Ven: 49 mmHg (ref 44–60)
pH, Ven: 7.32 (ref 7.25–7.43)
pO2, Ven: 58 mmHg — ABNORMAL HIGH (ref 32–45)

## 2024-12-12 LAB — CBC WITH DIFFERENTIAL/PLATELET
Abs Immature Granulocytes: 0.03 K/uL (ref 0.00–0.07)
Basophils Absolute: 0 K/uL (ref 0.0–0.1)
Basophils Relative: 0 %
Eosinophils Absolute: 0 K/uL (ref 0.0–0.5)
Eosinophils Relative: 0 %
HCT: 32.6 % — ABNORMAL LOW (ref 36.0–46.0)
Hemoglobin: 10.2 g/dL — ABNORMAL LOW (ref 12.0–15.0)
Immature Granulocytes: 0 %
Lymphocytes Relative: 30 %
Lymphs Abs: 2.2 K/uL (ref 0.7–4.0)
MCH: 27.9 pg (ref 26.0–34.0)
MCHC: 31.3 g/dL (ref 30.0–36.0)
MCV: 89.3 fL (ref 80.0–100.0)
Monocytes Absolute: 0.6 K/uL (ref 0.1–1.0)
Monocytes Relative: 8 %
Neutro Abs: 4.5 K/uL (ref 1.7–7.7)
Neutrophils Relative %: 62 %
Platelets: 231 K/uL (ref 150–400)
RBC: 3.65 MIL/uL — ABNORMAL LOW (ref 3.87–5.11)
RDW: 15.7 % — ABNORMAL HIGH (ref 11.5–15.5)
WBC: 7.4 K/uL (ref 4.0–10.5)
nRBC: 0 % (ref 0.0–0.2)

## 2024-12-12 LAB — RESP PANEL BY RT-PCR (RSV, FLU A&B, COVID)  RVPGX2
Influenza A by PCR: POSITIVE — AB
Influenza B by PCR: NEGATIVE
Resp Syncytial Virus by PCR: NEGATIVE
SARS Coronavirus 2 by RT PCR: NEGATIVE

## 2024-12-12 LAB — BASIC METABOLIC PANEL WITH GFR
Anion gap: 12 (ref 5–15)
BUN: 36 mg/dL — ABNORMAL HIGH (ref 8–23)
CO2: 25 mmol/L (ref 22–32)
Calcium: 8.8 mg/dL — ABNORMAL LOW (ref 8.9–10.3)
Chloride: 101 mmol/L (ref 98–111)
Creatinine, Ser: 1.92 mg/dL — ABNORMAL HIGH (ref 0.44–1.00)
GFR, Estimated: 24 mL/min — ABNORMAL LOW
Glucose, Bld: 316 mg/dL — ABNORMAL HIGH (ref 70–99)
Potassium: 4.5 mmol/L (ref 3.5–5.1)
Sodium: 138 mmol/L (ref 135–145)

## 2024-12-12 LAB — CREATININE, SERUM
Creatinine, Ser: 2.12 mg/dL — ABNORMAL HIGH (ref 0.44–1.00)
GFR, Estimated: 21 mL/min — ABNORMAL LOW

## 2024-12-12 LAB — CBC
HCT: 29.2 % — ABNORMAL LOW (ref 36.0–46.0)
Hemoglobin: 9.2 g/dL — ABNORMAL LOW (ref 12.0–15.0)
MCH: 27.7 pg (ref 26.0–34.0)
MCHC: 31.5 g/dL (ref 30.0–36.0)
MCV: 88 fL (ref 80.0–100.0)
Platelets: 220 K/uL (ref 150–400)
RBC: 3.32 MIL/uL — ABNORMAL LOW (ref 3.87–5.11)
RDW: 15.7 % — ABNORMAL HIGH (ref 11.5–15.5)
WBC: 6.5 K/uL (ref 4.0–10.5)
nRBC: 0 % (ref 0.0–0.2)

## 2024-12-12 LAB — TROPONIN T, HIGH SENSITIVITY
Troponin T High Sensitivity: 21 ng/L — ABNORMAL HIGH (ref 0–19)
Troponin T High Sensitivity: 20 ng/L — ABNORMAL HIGH (ref 0–19)

## 2024-12-12 LAB — PRO BRAIN NATRIURETIC PEPTIDE: Pro Brain Natriuretic Peptide: 5657 pg/mL — ABNORMAL HIGH

## 2024-12-12 MED ORDER — FUROSEMIDE 10 MG/ML IJ SOLN
40.0000 mg | Freq: Once | INTRAMUSCULAR | Status: AC
  Administered 2024-12-12: 40 mg via INTRAVENOUS
  Filled 2024-12-12: qty 4

## 2024-12-12 MED ORDER — AZITHROMYCIN 250 MG PO TABS
500.0000 mg | ORAL_TABLET | Freq: Every day | ORAL | Status: DC
  Administered 2024-12-13 – 2024-12-14 (×2): 500 mg via ORAL
  Filled 2024-12-12 (×2): qty 2

## 2024-12-12 MED ORDER — SODIUM CHLORIDE 0.9 % IV SOLN: 500.0000 mg | INTRAVENOUS | Status: DC

## 2024-12-12 MED ORDER — IPRATROPIUM-ALBUTEROL 0.5-2.5 (3) MG/3ML IN SOLN
3.0000 mL | RESPIRATORY_TRACT | Status: DC
  Administered 2024-12-12 – 2024-12-13 (×4): 3 mL via RESPIRATORY_TRACT
  Filled 2024-12-12 (×3): qty 3

## 2024-12-12 MED ORDER — ACETAMINOPHEN 650 MG RE SUPP: 650.0000 mg | Freq: Four times a day (QID) | RECTAL | Status: DC | PRN

## 2024-12-12 MED ORDER — HEPARIN SODIUM (PORCINE) 5000 UNIT/ML IJ SOLN
5000.0000 [IU] | Freq: Three times a day (TID) | INTRAMUSCULAR | Status: DC
  Administered 2024-12-12 – 2024-12-15 (×7): 5000 [IU] via SUBCUTANEOUS
  Filled 2024-12-12 (×8): qty 1

## 2024-12-12 MED ORDER — IPRATROPIUM-ALBUTEROL 0.5-2.5 (3) MG/3ML IN SOLN: 3.0000 mL | RESPIRATORY_TRACT | Status: DC | PRN

## 2024-12-12 MED ORDER — INSULIN ASPART 100 UNIT/ML IJ SOLN
0.0000 [IU] | Freq: Three times a day (TID) | INTRAMUSCULAR | Status: DC
  Administered 2024-12-13: 3 [IU] via SUBCUTANEOUS
  Administered 2024-12-13: 2 [IU] via SUBCUTANEOUS
  Filled 2024-12-12: qty 2
  Filled 2024-12-12: qty 3

## 2024-12-12 MED ORDER — AZITHROMYCIN 250 MG PO TABS
500.0000 mg | ORAL_TABLET | Freq: Once | ORAL | Status: AC
  Administered 2024-12-12: 500 mg via ORAL
  Filled 2024-12-12: qty 2

## 2024-12-12 MED ORDER — ACETAMINOPHEN 325 MG PO TABS
650.0000 mg | ORAL_TABLET | Freq: Four times a day (QID) | ORAL | Status: DC | PRN
  Administered 2024-12-14 – 2024-12-17 (×3): 650 mg via ORAL
  Filled 2024-12-12 (×3): qty 2

## 2024-12-12 MED ORDER — SODIUM CHLORIDE 0.9 % IV SOLN
1.0000 g | INTRAVENOUS | Status: AC
  Administered 2024-12-13 – 2024-12-16 (×4): 1 g via INTRAVENOUS
  Filled 2024-12-12 (×4): qty 10

## 2024-12-12 MED ORDER — SODIUM CHLORIDE 0.9 % IV SOLN
1.0000 g | Freq: Once | INTRAVENOUS | Status: AC
  Administered 2024-12-12: 1 g via INTRAVENOUS
  Filled 2024-12-12: qty 10

## 2024-12-12 MED ORDER — ASPIRIN 81 MG PO CHEW
81.0000 mg | CHEWABLE_TABLET | Freq: Every day | ORAL | Status: DC
  Administered 2024-12-13 – 2024-12-18 (×6): 81 mg via ORAL
  Filled 2024-12-12 (×6): qty 1

## 2024-12-12 NOTE — Progress Notes (Addendum)
 Janet Steele is a 89 y.o.  with  has no history on file for tobacco use. with  Active Ambulatory Problems    Diagnosis Date Noted   No Active Ambulatory Problems   Resolved Ambulatory Problems    Diagnosis Date Noted   No Resolved Ambulatory Problems   No Additional Past Medical History   who presents today at Urgent Care for  Chief Complaint  Patient presents with   Respiratory Distress    Son noted pt with resp distress about 2 days ago.  The [facility] center called son advising that the pt needed to be checked due to increasing resp distress and O2 sat of 92.  PT with chronic cough reported by son.  Gets choked easily.         History of Present Illness The patient is a 89 year old female who presents today with shortness of breath. She has been in borderline respiratory distress for the last 2 days. Her retirement center called her son and advised her that she should be checked out. She currently has a pulse oximetry reading of 92 percent and a respiration rate of 26. She has difficulty with movement, and her son reports that she gets choked easily. She is accompanied by her son.      Patient has no co-morbidities  or medications that might increase the risk for developing a severe infection     has no history on file for tobacco use.  Review of Systems  Review of systems is otherwise negative except as noted in the HPI and Assessment/MDM   Physical Exam  BP (!) 125/50 (BP Location: Left arm, Patient Position: Sitting)   Pulse 64   Temp 98.8 F (37.1 C) (Oral)   Resp (!) 22   SpO2 97% Comment: On 2 lpm  Constitutional:      General: Patient is not in acute distress.    Appearance: Normal appearance.  Neurological:     General: No focal deficit present.     Mental Status: alert and oriented to person, place, and time.  HENT:     Eyes:     Conjunctiva/sclera: Conjunctivae normal. Pharynx normal    There is no associated anterior cervical lymphadenopathy     Pupils: Pupils are equal, round, and reactive to light.     TM's normal with no visible effusion  Cardiovascular:     Heart sounds: Normal heart sounds. No murmur heard.    No Lower extremity edema noted Pulmonary:     Effort: Pulmonary effort is normal. No respiratory distress.     Breath sounds: No wheezing, rhonchi or rales.  Abdominal:     General: Bowel sounds are normal. There is no distension.     Tenderness: There is no abdominal tenderness.  Musculoskeletal:        General: Normal range of motion.     Neck:  No rigidity.  Skin:    General: Skin is warm and dry.  Psychiatric:        Mood and Affect: Mood normal.   No orders to display            DIAGNOSIS/PLAN     1. Shortness of breath  POC Influenza A&B NAT (IDNOW)   POC SARS-COV-2 SYMPTOMATIC (IDNOW)   ipratropium-albuterol  (DUO-NEB) 0.5-2.5 mg/3 mL nebulizer solution 3 mL    2. Influenza A        Assessment & Plan Initial Assessment: 89 year old female with shortness of breath and borderline respiratory distress for the last  2 days. Pulse ox at 92, respiration at 26. Difficulty with movement and choking easily.  Differential Diagnosis: - Influenza A: Significant wheezing and labored breathing. Elevated risk due to advanced age. Hospital admission for monitoring and IV fluids/medications, including Tamiflu.  ED Course: - Pulse ox at 92 - Respiration at 26  Final Assessment: Patient presents with severe case of Influenza A, significant wheezing, and labored breathing. Elevated risk due to advanced age. Hospital admission recommended for monitoring and administration of IV fluids and medications, including Tamiflu.  Clinical Impression: - Influenza A  Disposition: - Admission: Recommended for close monitoring and administration of IV fluids and medications, including Tamiflu.  Follow-Up:  Patient Education: Discussed risks associated with Influenza A and importance of hospital  monitoring.  Portions of this note were created using the aid of voice recognition Dragon/DAX dictation software.   We discussed risks and side effects of medications, and also discussed red flags which would warrant immediate follow-up.   Urgent Care Disposition:  Follow up with ED

## 2024-12-12 NOTE — H&P (Addendum)
 " History and Physical    Janet Steele FMW:[id number] DOB: [birth year] DOA: 12/12/2024  Patient coming from: Assisted living facility.  Chief Complaint: Shortness of breath.  History obtained from patient's son.  HPI: Janet Steele is a 89 y.o. female with history of CAD status post CABG, symptomatic bradycardia status post pacemaker placement, chronic HFrEF, dementia, diabetes mellitus type 2, chronic kidney disease stage III, chronic anemia, hypothyroidism was brought to the ER after patient was found to be short of breath and tachypneic at the urgent care.  Per patient's son patient has been short of breath last couple of days and since patient's shortness of breath did not improve with persistent productive cough patient was taken to the urgent care.  At the urgent care patient was found to be tachypneic and concerning for shortness of breath and respiratory failure was referred to the ER.  ED Course: In the ER patient was requiring 4 L oxygen to maintain sats more than 90%.  CT chest shows features concerning for left upper lobe and lingular pneumonia.  Influenza test positive.  Patient's proBNP is 5600 troponins are flat at 21 and 20.  Patient also had features of possible fluid overload was given one dose of Lasix  40 mg IV and started on empiric antibiotics for pneumonia and also Tamiflu  for influenza A.  Creatinine is 1.9 blood glucose 316 hemoglobin 10.2.  Review of Systems: As per HPI, rest all negative.   Past Medical History:  Diagnosis Date   Anemia    Anxiety    Arthritis    fingers (05/19/2016   CAD (coronary artery disease)    a. s/p CABG 1982 b. redo CABG 2003. c. [place]  despite med rx 05/2016: successful but complicated PCI of PDA beyond graft site, c/b localized dissection requiring overlapping stent.   Carotid bruit    a. Duplex 01/2015: patent vessels, 1-39% BICA, f/u PRN recommened.   Colon polyp    adenomatous   Colon, diverticulosis    Degenerative joint disease     Diastolic dysfunction    Esophageal stricture    GERD (gastroesophageal reflux disease)    Hiatal hernia    History of blood transfusion    related to my bypass   Hyperlipidemia    Hypertension    Hypothyroidism    Ischemic cardiomyopathy    a. Cath 05/2016: EF 40% with severe inferior wall HK, suspect hibernating myocardium.   Myocardial infarction (HCC) 1977; 1982   Osteoarthrosis, unspecified whether generalized or localized, unspecified site    Presence of permanent cardiac pacemaker    PVD (peripheral vascular disease)    Stroke (HCC)    greater than 2 years ago per husband   Type II diabetes mellitus (HCC)    Unspecified hearing loss     Past Surgical History:  Procedure Laterality Date   ABDOMINAL HYSTERECTOMY     APPENDECTOMY     BACK SURGERY     CARDIAC CATHETERIZATION N/A 05/19/2016   Procedure: Left Heart Cath and Cors/Grafts Angiography;  Surgeon: Victory LELON Sharps, MD;  Location: [facility] INVASIVE CV LAB;  Service: Cardiovascular;  Laterality: N/A;   CARDIAC CATHETERIZATION N/A 05/19/2016   Procedure: Coronary Stent Intervention;  Surgeon: Victory LELON Sharps, MD;  Location: [facility] INVASIVE CV LAB;  Service: Cardiovascular;  Laterality: N/A;   CATARACT EXTRACTION W/ INTRAOCULAR LENS  IMPLANT, BILATERAL Bilateral    CHOLECYSTECTOMY OPEN     COLONOSCOPY     CORONARY ANGIOPLASTY WITH STENT PLACEMENT  05/19/2016   2  stents?   CORONARY ARTERY BYPASS GRAFT  1982; 2003   x4(1982),02-2002 CABG X2   ESOPHAGOGASTRODUODENOSCOPY (EGD) WITH ESOPHAGEAL DILATION  2-3 times   INSERT / REPLACE / REMOVE PACEMAKER     LEFT HEART CATHETERIZATION WITH CORONARY/GRAFT ANGIOGRAM N/A 03/05/2014   Procedure: LEFT HEART CATHETERIZATION WITH EL BILE;  Surgeon: Victory LELON Claudene DOUGLAS, MD;  Location: [facility] CATH LAB;  Service: Cardiovascular;  Laterality: N/A;   LUMBAR DISC SURGERY  01-2000   OOPHORECTOMY Bilateral    PERMANENT PACEMAKER INSERTION N/A 07/06/2012   MDT Adapta L implanted by Dr Kelsie for  symptomatic bradycardia   TONSILLECTOMY  1930s   UPPER GASTROINTESTINAL ENDOSCOPY       reports that she quit smoking about 48 years ago. Her smoking use included cigarettes. She started smoking about 78 years ago. She has a 15 pack-year smoking history. She has never used smokeless tobacco. She reports that she does not drink alcohol and does not use drugs.  Allergies[1]  Family History  Problem Relation Age of Onset   Heart disease Sister    Heart attack Sister    Colon cancer Neg Hx    Breast cancer Neg Hx    Celiac disease Neg Hx    Cirrhosis Neg Hx    Clotting disorder Neg Hx    Colitis Neg Hx    Colon polyps Neg Hx    Crohn's disease Neg Hx    Cystic fibrosis Neg Hx    Diabetes Neg Hx    Esophageal cancer Neg Hx    Hemochromatosis Neg Hx    Inflammatory bowel disease Neg Hx    Irritable bowel syndrome Neg Hx    Kidney disease Neg Hx    Liver cancer Neg Hx    Liver disease Neg Hx    Ovarian cancer Neg Hx    Pancreatic cancer Neg Hx    Prostate cancer Neg Hx    Rectal cancer Neg Hx    Stomach cancer Neg Hx    Ulcerative colitis Neg Hx    Uterine cancer Neg Hx    Wilson's disease Neg Hx     Prior to Admission medications  Medication Sig Start Date End Date Taking? Authorizing Provider  acetaminophen  (TYLENOL ) 325 MG tablet TAKE 2 TABS (650MG  DOSE) BY MOUTH EVERY 6 HOURS AS NEEDED FOR PAIN 05/23/24   Geofm Glade PARAS, MD  albuterol  (PROVENTIL ) (2.5 MG/3ML) 0.083% nebulizer solution Take 3 mLs (2.5 mg total) by nebulization every 12 (twelve) hours as needed for wheezing or shortness of breath. 09/27/23   Geofm Glade PARAS, MD  ASPIRIN  LOW DOSE 81 MG chewable tablet GIVE 1 TAB BY MOUTH ONCE DAILY 10/04/23   Burns, Glade PARAS, MD  atorvastatin  (LIPITOR ) 80 MG tablet GIVE 1 TAB BY MOUTH ONCE DAILY F/U APPT DUE IN SEPT FOR FUTURE REFILLS 04/10/24   Geofm Glade PARAS, MD  clopidogrel  (PLAVIX ) 75 MG tablet TAKE 1 TABLET (75 MG TOTAL) BY MOUTH DAILY. F/U APPT DUE IN SEPT FOR FUTURE REFILLS  10/04/23   Geofm Glade PARAS, MD  donepezil  (ARICEPT ) 5 MG tablet Take 1 tablet (5 mg total) by mouth at bedtime. 11/15/23   Geofm Glade PARAS, MD  DULoxetine  (CYMBALTA ) 60 MG capsule GIVE 1 CAP BY MOUTH ONCE DAILY F/U APPT DUE IN SEPT FOR FUTURE REFILLS 01/03/24   Geofm Glade PARAS, MD  glimepiride  (AMARYL ) 1 MG tablet TAKE 1 TABLET (1 MG TOTAL) BY MOUTH DAILY WITH BREAKFAST. F/U APPT DUE IN SEPT FOR FUTURE REFILLS 10/04/23  Geofm Glade PARAS, MD  levothyroxine  (SYNTHROID ) 112 MCG tablet Take 1 tablet (112 mcg total) by mouth 2 (two) times daily. 05/17/24   Geofm Glade PARAS, MD  meclizine  (ANTIVERT ) 25 MG tablet Take 1 tablet (25 mg total) by mouth 3 (three) times daily as needed for dizziness. 04/27/24   Emil Share, DO  metoprolol  tartrate (LOPRESSOR ) 25 MG tablet TAKE 1 TAB BY MOUTH TWICE DAILY 03/20/24   Burns, Glade PARAS, MD  nitroGLYCERIN  (NITROSTAT ) 0.4 MG SL tablet TAKE 1 TAB SUBLINGUALLY EVERY 5 MINUTES FOR 3 DOSES AS NEEDED FOR CHEST PAIN. IF NO RELIEF CALL MD. 04/26/23   Geofm Glade PARAS, MD  OLANZapine  (ZYPREXA ) 5 MG tablet TAKE DAILY AT 4PM 03/27/24   Geofm Glade PARAS, MD  ondansetron  (ZOFRAN -ODT) 4 MG disintegrating tablet TAKE 1 TAB BY MOUTH EVERY 8 HOURS AS NEEDED FOR NAUSEA 01/31/24   Geofm Glade PARAS, MD  pantoprazole  (PROTONIX ) 40 MG tablet TAKE 1 TAB (40MG ) BY MOUTH ONCE DAILY 12/06/23   Geofm Glade PARAS, MD  promethazine  (PHENERGAN ) 25 MG tablet Take 1 tablet (25 mg total) by mouth every 6 (six) hours as needed for nausea or vomiting. 04/27/24   Emil Share, DO  SYMBICORT  80-4.5 MCG/ACT inhaler INHALE 2 PUFFS BY MOUTH 2 TIMES A DAY 12/12/24   Geofm Glade PARAS, MD    Physical Exam: Constitutional: Moderately built and nourished. Vitals:   12/12/24 1858 12/12/24 1901 12/12/24 1915 12/12/24 1943  BP:  (!) 128/115 (!) 118/49   Pulse:  60 (!) 59   Resp:  (!) 22 (!) 23   Temp:      TempSrc:      SpO2: 99% 99% 96% 96%   Eyes: Anicteric no pallor. ENMT: No discharge from the ears eyes nose or mouth. Neck: No  mass felt.  No neck rigidity. Respiratory: No rhonchi or crepitations. Cardiovascular: S1-S2 heard. Abdomen: Soft nontender bowel sound present. Musculoskeletal: No edema. Skin: No rash. Neurologic: Alert awake oriented to her name and place moving all extremities. Psychiatric: Oriented to her name and place.   Labs on Admission: I have personally reviewed following labs and imaging studies  CBC: Recent Labs  Lab 12/12/24 1608  WBC 7.4  NEUTROABS 4.5  HGB 10.2*  HCT 32.6*  MCV 89.3  PLT 231   Basic Metabolic Panel: Recent Labs  Lab 12/12/24 1608  NA 138  K 4.5  CL 101  CO2 25  GLUCOSE 316*  BUN 36*  CREATININE 1.92*  CALCIUM  8.8*   GFR: CrCl cannot be calculated (Unknown ideal weight.). Liver Function Tests: No results for input(s): AST, ALT, ALKPHOS, BILITOT, PROT, ALBUMIN in the last 168 hours. No results for input(s): LIPASE, AMYLASE in the last 168 hours. No results for input(s): AMMONIA in the last 168 hours. Coagulation Profile: No results for input(s): INR, PROTIME in the last 168 hours. Cardiac Enzymes: No results for input(s): CKTOTAL, CKMB, CKMBINDEX, TROPONINI in the last 168 hours. BNP (last 3 results) Recent Labs    12/12/24 1608  PROBNP 5,657.0*   HbA1C: No results for input(s): HGBA1C in the last 72 hours. CBG: No results for input(s): GLUCAP in the last 168 hours. Lipid Profile: No results for input(s): CHOL, HDL, LDLCALC, TRIG, CHOLHDL, LDLDIRECT in the last 72 hours. Thyroid  Function Tests: No results for input(s): TSH, T4TOTAL, FREET4, T3FREE, THYROIDAB in the last 72 hours. Anemia Panel: No results for input(s): VITAMINB12, FOLATE, FERRITIN, TIBC, IRON, RETICCTPCT in the last 72 hours. Urine analysis:    Component  Value Date/Time   COLORURINE YELLOW 10/16/2021 1356   APPEARANCEUR CLEAR 10/16/2021 1356   LABSPEC 1.015 10/16/2021 1356   PHURINE 6.0 10/16/2021 1356    GLUCOSEU NEGATIVE 10/16/2021 1356   HGBUR SMALL (A) 10/16/2021 1356   HGBUR negative 01/30/2009 1506   BILIRUBINUR NEGATIVE 10/16/2021 1356   BILIRUBINUR 1+ 05/09/2020 1532   KETONESUR NEGATIVE 10/16/2021 1356   PROTEINUR Positive (A) 05/09/2020 1532   PROTEINUR NEGATIVE 04/04/2018 1538   UROBILINOGEN 0.2 10/16/2021 1356   NITRITE NEGATIVE 10/16/2021 1356   LEUKOCYTESUR LARGE (A) 10/16/2021 1356   Sepsis Labs: @LABRCNTIP (procalcitonin:4,lacticidven:4) )No results found for this or any previous visit (from the past 240 hours).   Radiological Exams on Admission: CT Chest Wo Contrast Result Date: 12/12/2024 EXAM: CT CHEST WITHOUT CONTRAST 12/12/2024 06:31:55 PM TECHNIQUE: CT of the chest was performed without the administration of intravenous contrast. Multiplanar reformatted images are provided for review. Automated exposure control, iterative reconstruction, and/or weight based adjustment of the mA/kV was utilized to reduce the radiation dose to as low as reasonably achievable. COMPARISON: None available. CLINICAL HISTORY: sob, wheezing, effusion on xray FINDINGS: MEDIASTINUM: Heart is mildly enlarged. Patient is status post cardiac surgery. Left sided pacemaker is present. Coronary and aortic atherosclerotic calcifications are noted. The thyroid  gland is not well seen. There is a moderate sized hiatal hernia. The central airways are clear. LYMPH NODES: No mediastinal, hilar or axillary lymphadenopathy. LUNGS AND PLEURA: There is scarring in both lung apices. There are clustered tree-in-bud opacities in the posterior left upper lobe and lingula compatible with infectious/inflammatory process. There are numerous scattered diffuse pulmonary nodules measuring 4 mm or less seen throughout both lungs. There are some peripheral reticular opacities in the lung bases which are nonspecific. No pleural effusion or pneumothorax. SOFT TISSUES/BONES: Sternal wires are present. No acute abnormality of the bones  or soft tissues. UPPER ABDOMEN: Limited images of the upper abdomen demonstrates no acute abnormality. IMPRESSION: 1. Clustered opacities in the posterior left upper lobe and lingula compatible with an infectious or inflammatory process. 2. Numerous scattered diffuse pulmonary nodules measuring 4 mm or less throughout both lungs findings are indeterminate and may be infectious or inflammatory or related to granulomatous disease. Other etiologies such as metastatic disease are not excluded in the appropriate clinical setting. Consider short-term follow-up CT to reevaluate. 3. Peripheral reticular opacities in the lung bases, nonspecific. 4. Moderate sized hiatal hernia. Electronically signed by: Greig Pique MD MD 12/12/2024 06:46 PM EST RP Workstation: [id number]   DG Chest 1 View Result Date: 12/12/2024 EXAM: 1 VIEW(S) XRAY OF THE CHEST 12/12/2024 04:37:00 PM COMPARISON: 04/27/2024 CLINICAL HISTORY: sob FINDINGS: LUNGS AND PLEURA: No focal pulmonary opacity. No pleural effusion. No pneumothorax. HEART AND MEDIASTINUM: Stable left dual lead pacemaker with lead terminating in the right atrium and right ventricle. CABG changes noted. Aortic atherosclerosis. BONES AND SOFT TISSUES: Sternotomy wires noted. Multilevel thoracic osteophytosis. IMPRESSION: 1. No acute cardiopulmonary abnormality. Electronically signed by: Rogelia Myers MD MD 12/12/2024 04:41 PM EST RP Workstation: [id number]    EKG: Independently reviewed.  Normal sinus rhythm.  Assessment/Plan Principal Problem:   Acute respiratory failure with hypoxia (HCC) Active Problems:   Hypothyroidism   Diabetes mellitus with diabetic neuropathy (HCC)   CAD, ARTERY BYPASS GRAFT   Pacemaker-[employer]   Sick sinus syndrome (HCC)   Dementia with behavioral disturbance (HCC)   Anemia    Acute respiratory failure with hypoxia presently on 4 L oxygen secondary to pneumonia and possible acute on chronic HFrEF. Pneumonia likely  secondary to influenza A  and possible bacterial pneumonia.  On empiric antibiotics and also on Tamiflu . Acute on chronic HFrEF last EF measured was 40% with grade 1 diastolic dysfunction on March 7975.One dose of Lasix  40 mg IV was given.  Follow respiratory status. Diabetes mellitus type 2 with hyperglycemia Home medication needs to be verified.  Hemoglobin A1c 6.8.  Will keep patient on sliding scale coverage for now. CAD status post CABG per cardiology notes patient also on antiplatelet agents metoprolol  and statins which we will continue. Hypothyroidism Synthroid  dose needs to be verified. Sick sinus syndrome status post pacemaker placement. Chronic kidney disease stage III creatinine at around baseline. Chronic anemia likely from renal disease follow CBC. History of dementia Home medicine needs to be verified.   History of depression Home medicine needs to be verified. Lung nodules seen on CAT scan will need follow-up as outpatient.  We need to verify patient's home medications.  Unable to reach patient's living facility at this time to verify the home medications.  DVT prophylaxis: Heparin . Code Status: DNR confirmed with patient's son. Family Communication: Patient's son. Disposition Plan: Monitored bed. Consults called: None. Admission status: Observation.         [1]  Allergies Allergen Reactions   Morphine Hives, Itching and Rash   Penicillins Itching and Rash    Has patient had a PCN reaction causing immediate rash, facial/tongue/throat swelling, SOB or lightheadedness with hypotension: Yes Has patient had a PCN reaction causing severe rash involving mucus membranes or skin necrosis: No Has patient had a PCN reaction that required hospitalization No Has patient had a PCN reaction occurring within the last 10 years: No If all of the above answers are NO, then may proceed with Cephalosporin use.    Prednisone  Other (See Comments)    MEMORY LOSS    Sulfonamide Derivatives Itching and Rash    Codeine Rash   Hydrocodone -Acetaminophen  Other (See Comments)    unknown   Meperidine Hcl Itching   Metoclopramide Hcl Itching and Rash   Metoprolol  Succinate Other (See Comments)    nervous   Moxifloxacin Itching   Oxycodone -Aspirin  Rash   Pentazocine Lactate Nausea Only   Pioglitazone Other (See Comments)    bloating   Trazodone  And Nefazodone Other (See Comments)    Urinary incontinence, stomach problems   "

## 2024-12-12 NOTE — ED Triage Notes (Signed)
 Pt BIB EMS from urgent care -0 resideds at [facility]. SOB past 2 days  - positive for flu A. Duoneb at urgent care - Room air sat 90% - Wheezing and ronchi.

## 2024-12-12 NOTE — ED Provider Notes (Signed)
 " [place] [facility] DE[facility]MENT AT [facility] Provider Note   CSN: [id number] Arrival date & time: 12/12/24  1510     Patient presents with: Shortness of Breath   Janet Steele is a 89 y.o. female.   89 year old female presents for shortness of breath.  She has breathing treatments at home but does not know what for.  She is hard of hearing and a poor historian and most of history is provided by EMS.  They state that patient was diagnosed with the flu a few days ago and lives in independent living.  Independent living.  She was more short of breath and she went to urgent care.  Patient states she was 90% on room air and they gave her breathing treatment and did not help.  EMS has given her 2 more breathing treatments steroids and magnesium  and she is still tachypneic and wheezing.  Patient denies any other symptoms or concerns.   Shortness of Breath Associated symptoms: cough and wheezing   Associated symptoms: no abdominal pain, no chest pain, no ear pain, no fever, no rash, no sore throat and no vomiting        Prior to Admission medications  Medication Sig Start Date End Date Taking? Authorizing Provider  acetaminophen  (TYLENOL ) 325 MG tablet TAKE 2 TABS (650MG  DOSE) BY MOUTH EVERY 6 HOURS AS NEEDED FOR PAIN 05/23/24   Geofm Glade PARAS, MD  albuterol  (PROVENTIL ) (2.5 MG/3ML) 0.083% nebulizer solution Take 3 mLs (2.5 mg total) by nebulization every 12 (twelve) hours as needed for wheezing or shortness of breath. 09/27/23   Geofm Glade PARAS, MD  ASPIRIN  LOW DOSE 81 MG chewable tablet GIVE 1 TAB BY MOUTH ONCE DAILY 10/04/23   Burns, Glade PARAS, MD  atorvastatin  (LIPITOR ) 80 MG tablet GIVE 1 TAB BY MOUTH ONCE DAILY F/U APPT DUE IN SEPT FOR FUTURE REFILLS 04/10/24   Geofm Glade PARAS, MD  clopidogrel  (PLAVIX ) 75 MG tablet TAKE 1 TABLET (75 MG TOTAL) BY MOUTH DAILY. F/U APPT DUE IN SEPT FOR FUTURE REFILLS 10/04/23   Geofm Glade PARAS, MD  donepezil  (ARICEPT ) 5 MG tablet Take 1 tablet (5 mg  total) by mouth at bedtime. 11/15/23   Geofm Glade PARAS, MD  DULoxetine  (CYMBALTA ) 60 MG capsule GIVE 1 CAP BY MOUTH ONCE DAILY F/U APPT DUE IN SEPT FOR FUTURE REFILLS 01/03/24   Geofm Glade PARAS, MD  glimepiride  (AMARYL ) 1 MG tablet TAKE 1 TABLET (1 MG TOTAL) BY MOUTH DAILY WITH BREAKFAST. F/U APPT DUE IN SEPT FOR FUTURE REFILLS 10/04/23   Geofm Glade PARAS, MD  levothyroxine  (SYNTHROID ) 112 MCG tablet Take 1 tablet (112 mcg total) by mouth 2 (two) times daily. 05/17/24   Geofm Glade PARAS, MD  meclizine  (ANTIVERT ) 25 MG tablet Take 1 tablet (25 mg total) by mouth 3 (three) times daily as needed for dizziness. 04/27/24   Emil Share, DO  metoprolol  tartrate (LOPRESSOR ) 25 MG tablet TAKE 1 TAB BY MOUTH TWICE DAILY 03/20/24   Geofm Glade PARAS, MD  nitroGLYCERIN  (NITROSTAT ) 0.4 MG SL tablet TAKE 1 TAB SUBLINGUALLY EVERY 5 MINUTES FOR 3 DOSES AS NEEDED FOR CHEST PAIN. IF NO RELIEF CALL MD. 04/26/23   Geofm Glade PARAS, MD  OLANZapine  (ZYPREXA ) 5 MG tablet TAKE DAILY AT 4PM 03/27/24   Geofm Glade PARAS, MD  ondansetron  (ZOFRAN -ODT) 4 MG disintegrating tablet TAKE 1 TAB BY MOUTH EVERY 8 HOURS AS NEEDED FOR NAUSEA 01/31/24   Geofm Glade PARAS, MD  pantoprazole  (PROTONIX ) 40 MG tablet  TAKE 1 TAB (40MG ) BY MOUTH ONCE DAILY 12/06/23   Geofm Glade PARAS, MD  promethazine  (PHENERGAN ) 25 MG tablet Take 1 tablet (25 mg total) by mouth every 6 (six) hours as needed for nausea or vomiting. 04/27/24   Emil Share, DO  SYMBICORT  80-4.5 MCG/ACT inhaler INHALE 2 PUFFS BY MOUTH 2 TIMES A DAY 12/12/24   Geofm Glade PARAS, MD    Allergies: Morphine, Penicillins, Prednisone , Sulfonamide derivatives, Codeine, Hydrocodone -acetaminophen , Meperidine hcl, Metoclopramide hcl, Metoprolol  succinate, Moxifloxacin, Oxycodone -aspirin , Pentazocine lactate, Pioglitazone, and Trazodone  and nefazodone    Review of Systems  Constitutional:  Negative for chills and fever.  HENT:  Negative for ear pain and sore throat.   Eyes:  Negative for pain and visual disturbance.   Respiratory:  Positive for cough, shortness of breath and wheezing.   Cardiovascular:  Negative for chest pain and palpitations.  Gastrointestinal:  Negative for abdominal pain and vomiting.  Genitourinary:  Negative for dysuria and hematuria.  Musculoskeletal:  Negative for arthralgias and back pain.  Skin:  Negative for color change and rash.  Neurological:  Negative for seizures and syncope.  All other systems reviewed and are negative.   Updated Vital Signs BP 118/79   Pulse (!) 59   Temp 98.4 F (36.9 C)   Resp 18   SpO2 96%   Physical Exam Vitals and nursing note reviewed.  Constitutional:      General: She is in acute distress.     Appearance: She is well-developed. She is ill-appearing.  HENT:     Head: Normocephalic and atraumatic.  Eyes:     Conjunctiva/sclera: Conjunctivae normal.  Cardiovascular:     Rate and Rhythm: Normal rate and regular rhythm.     Heart sounds: No murmur heard. Pulmonary:     Effort: Tachypnea and respiratory distress present.     Breath sounds: Decreased breath sounds and wheezing present.  Abdominal:     Palpations: Abdomen is soft.     Tenderness: There is no abdominal tenderness.  Musculoskeletal:        General: No swelling.     Cervical back: Neck supple.  Skin:    General: Skin is warm and dry.     Capillary Refill: Capillary refill takes less than 2 seconds.  Neurological:     General: No focal deficit present.     Mental Status: She is alert.  Psychiatric:        Mood and Affect: Mood normal.     (all labs ordered are listed, but only abnormal results are displayed) Labs Reviewed  BASIC METABOLIC PANEL WITH GFR - Abnormal; Notable for the following components:      Result Value   Glucose, Bld 316 (*)    BUN 36 (*)    Creatinine, Ser 1.92 (*)    Calcium  8.8 (*)    GFR, Estimated 24 (*)    All other components within normal limits  PRO BRAIN NATRIURETIC PEPTIDE - Abnormal; Notable for the following components:    Pro Brain Natriuretic Peptide 5,657.0 (*)    All other components within normal limits  CBC WITH DIFFERENTIAL/PLATELET - Abnormal; Notable for the following components:   RBC 3.65 (*)    Hemoglobin 10.2 (*)    HCT 32.6 (*)    RDW 15.7 (*)    All other components within normal limits  BLOOD GAS, VENOUS - Abnormal; Notable for the following components:   pO2, Ven 58 (*)    All other components within normal limits  TROPONIN T, HIGH SENSITIVITY - Abnormal; Notable for the following components:   Troponin T High Sensitivity 21 (*)    All other components within normal limits  TROPONIN T, HIGH SENSITIVITY - Abnormal; Notable for the following components:   Troponin T High Sensitivity 20 (*)    All other components within normal limits  RESP PANEL BY RT-PCR (RSV, FLU A&B, COVID)  RVPGX2  HEMOGLOBIN A1C  CBC  CREATININE, SERUM  TSH  COMPREHENSIVE METABOLIC PANEL WITH GFR  CBC    EKG: EKG Interpretation Date/Time:  Wednesday December 12 2024 15:26:57 EST Ventricular Rate:  64 PR Interval:  194 QRS Duration:  138 QT Interval:  436 QTC Calculation: 450 R Axis:   82  Text Interpretation: Sinus rhythm Nonspecific intraventricular conduction delay Nonspecific repol abnormality, diffuse leads Compared with prior EKG from 04/27/2024 Confirmed by Gennaro Bouchard ([id number]) on 12/12/2024 4:30:49 PM  Radiology: CT Chest Wo Contrast Result Date: 12/12/2024 EXAM: CT CHEST WITHOUT CONTRAST 12/12/2024 06:31:55 PM TECHNIQUE: CT of the chest was performed without the administration of intravenous contrast. Multiplanar reformatted images are provided for review. Automated exposure control, iterative reconstruction, and/or weight based adjustment of the mA/kV was utilized to reduce the radiation dose to as low as reasonably achievable. COMPARISON: None available. CLINICAL HISTORY: sob, wheezing, effusion on xray FINDINGS: MEDIASTINUM: Heart is mildly enlarged. Patient is status post cardiac surgery. Left sided  pacemaker is present. Coronary and aortic atherosclerotic calcifications are noted. The thyroid  gland is not well seen. There is a moderate sized hiatal hernia. The central airways are clear. LYMPH NODES: No mediastinal, hilar or axillary lymphadenopathy. LUNGS AND PLEURA: There is scarring in both lung apices. There are clustered tree-in-bud opacities in the posterior left upper lobe and lingula compatible with infectious/inflammatory process. There are numerous scattered diffuse pulmonary nodules measuring 4 mm or less seen throughout both lungs. There are some peripheral reticular opacities in the lung bases which are nonspecific. No pleural effusion or pneumothorax. SOFT TISSUES/BONES: Sternal wires are present. No acute abnormality of the bones or soft tissues. UPPER ABDOMEN: Limited images of the upper abdomen demonstrates no acute abnormality. IMPRESSION: 1. Clustered opacities in the posterior left upper lobe and lingula compatible with an infectious or inflammatory process. 2. Numerous scattered diffuse pulmonary nodules measuring 4 mm or less throughout both lungs findings are indeterminate and may be infectious or inflammatory or related to granulomatous disease. Other etiologies such as metastatic disease are not excluded in the appropriate clinical setting. Consider short-term follow-up CT to reevaluate. 3. Peripheral reticular opacities in the lung bases, nonspecific. 4. Moderate sized hiatal hernia. Electronically signed by: Greig Pique MD MD 12/12/2024 06:46 PM EST RP Workstation: [id number]   DG Chest 1 View Result Date: 12/12/2024 EXAM: 1 VIEW(S) XRAY OF THE CHEST 12/12/2024 04:37:00 PM COMPARISON: 04/27/2024 CLINICAL HISTORY: sob FINDINGS: LUNGS AND PLEURA: No focal pulmonary opacity. No pleural effusion. No pneumothorax. HEART AND MEDIASTINUM: Stable left dual lead pacemaker with lead terminating in the right atrium and right ventricle. CABG changes noted. Aortic atherosclerosis. BONES AND  SOFT TISSUES: Sternotomy wires noted. Multilevel thoracic osteophytosis. IMPRESSION: 1. No acute cardiopulmonary abnormality. Electronically signed by: Rogelia Myers MD MD 12/12/2024 04:41 PM EST RP Workstation: [id number]     Procedures   Medications Ordered in the ED  aspirin  chewable tablet 81 mg (has no administration in time range)  insulin  aspart (novoLOG ) injection 0-9 Units (has no administration in time range)  heparin  injection 5,000 Units (has no administration in time range)  acetaminophen  (TYLENOL ) tablet 650 mg (has no administration in time range)    Or  acetaminophen  (TYLENOL ) suppository 650 mg (has no administration in time range)  ipratropium-albuterol  (DUONEB) 0.5-2.5 (3) MG/3ML nebulizer solution 3 mL (has no administration in time range)  ipratropium-albuterol  (DUONEB) 0.5-2.5 (3) MG/3ML nebulizer solution 3 mL (has no administration in time range)  cefTRIAXone  (ROCEPHIN ) 1 g in sodium chloride  0.9 % 100 mL IVPB (has no administration in time range)  azithromycin  (ZITHROMAX ) tablet 500 mg (has no administration in time range)  furosemide  (LASIX ) injection 40 mg (40 mg Intravenous Given 12/12/24 1739)  cefTRIAXone  (ROCEPHIN ) 1 g in sodium chloride  0.9 % 100 mL IVPB (1 g Intravenous New Bag/Given 12/12/24 1931)  azithromycin  (ZITHROMAX ) tablet 500 mg (500 mg Oral Given 12/12/24 1928)                                    Medical Decision Making Cardiac monitor interpretation: Sinus bradycardia, no ectopy  Social determinants of health: Dementia  Patient here for shortness of breath, hypoxia 89 to 90% for urgent care.  Here was initially on 6 L we decrease it to 2 L of oxygen as appropriate.  Was given a total of 3 breathing treatments 1 at urgent care 2 by EMS and steroids and magnesium  as well.  Still wheezing on arrival and was given an additional breathing treatment.  She has elevated BNP and troponin with a history of CHF with an EF of 40% we will give her a dose of  Lasix  when she is found have bilateral pneumonia on x-ray.  Will start her on antibiotics for this.  Discussed patient's case with Dr. Franky, hospitalist and he will admit the patient for further workup and management.  All results and plan were discussed with patient and son at bedside.  Problems Addressed: Acute on chronic congestive heart failure, unspecified heart failure type [facility]): acute illness or injury that poses a threat to life or bodily functions COPD exacerbation (HCC): acute illness or injury that poses a threat to life or bodily functions Pneumonia due to infectious organism, unspecified laterality, unspecified part of lung: acute illness or injury that poses a threat to life or bodily functions  Amount and/or Complexity of Data Reviewed External Data Reviewed: notes.    Details: Urgent care records reviewed and patient had an oxygen saturation of 89 to 90% while there, they gave her a breathing treatment without much improvement Labs: ordered. Decision-making details documented in ED Course.    Details: Ordered and reviewed by me and troponin and BNP are elevated Radiology: ordered and independent interpretation performed. Decision-making details documented in ED Course.    Details: Ordered and interpreted by me independently of radiology  chest x-ray: Shows may be pleural effusion on the left CT of the chest shows evidence of bilateral pneumonia ECG/medicine tests: ordered and independent interpretation performed. Decision-making details documented in ED Course.    Details: Ordered and interpreted by me in the absence of cardiology and shows sinus rhythm, no STEMI, or significant change when compared to prior EKG Discussion of management or test interpretation with external provider(s): Dr. Franky -hospitalist-discussed patient's case with him and he will admit patient for further workup and management  Risk OTC drugs. Prescription drug management. Drug therapy  requiring intensive monitoring for toxicity. Decision regarding hospitalization. Diagnosis or treatment significantly limited by social determinants of health.     Final diagnoses:  Pneumonia due to infectious organism, unspecified laterality, unspecified part of lung  COPD exacerbation (HCC)  Acute on chronic congestive heart failure, unspecified heart failure type [facility])    ED Discharge Orders     None          Gennaro Duwaine CROME, DO 12/12/24 2022  "

## 2024-12-13 DIAGNOSIS — J441 Chronic obstructive pulmonary disease with (acute) exacerbation: Secondary | ICD-10-CM | POA: Diagnosis present

## 2024-12-13 DIAGNOSIS — N179 Acute kidney failure, unspecified: Secondary | ICD-10-CM | POA: Diagnosis present

## 2024-12-13 DIAGNOSIS — E785 Hyperlipidemia, unspecified: Secondary | ICD-10-CM | POA: Diagnosis present

## 2024-12-13 DIAGNOSIS — Z1152 Encounter for screening for COVID-19: Secondary | ICD-10-CM | POA: Diagnosis not present

## 2024-12-13 DIAGNOSIS — N184 Chronic kidney disease, stage 4 (severe): Secondary | ICD-10-CM | POA: Diagnosis present

## 2024-12-13 DIAGNOSIS — F03918 Unspecified dementia, unspecified severity, with other behavioral disturbance: Secondary | ICD-10-CM | POA: Diagnosis present

## 2024-12-13 DIAGNOSIS — E1165 Type 2 diabetes mellitus with hyperglycemia: Secondary | ICD-10-CM | POA: Diagnosis present

## 2024-12-13 DIAGNOSIS — I130 Hypertensive heart and chronic kidney disease with heart failure and stage 1 through stage 4 chronic kidney disease, or unspecified chronic kidney disease: Secondary | ICD-10-CM | POA: Diagnosis present

## 2024-12-13 DIAGNOSIS — J159 Unspecified bacterial pneumonia: Secondary | ICD-10-CM | POA: Diagnosis present

## 2024-12-13 DIAGNOSIS — Z66 Do not resuscitate: Secondary | ICD-10-CM | POA: Diagnosis present

## 2024-12-13 DIAGNOSIS — Z7902 Long term (current) use of antithrombotics/antiplatelets: Secondary | ICD-10-CM | POA: Diagnosis not present

## 2024-12-13 DIAGNOSIS — E1151 Type 2 diabetes mellitus with diabetic peripheral angiopathy without gangrene: Secondary | ICD-10-CM | POA: Diagnosis present

## 2024-12-13 DIAGNOSIS — E1140 Type 2 diabetes mellitus with diabetic neuropathy, unspecified: Secondary | ICD-10-CM | POA: Diagnosis present

## 2024-12-13 DIAGNOSIS — J9601 Acute respiratory failure with hypoxia: Secondary | ICD-10-CM | POA: Diagnosis present

## 2024-12-13 DIAGNOSIS — I5023 Acute on chronic systolic (congestive) heart failure: Secondary | ICD-10-CM | POA: Insufficient documentation

## 2024-12-13 DIAGNOSIS — J09X1 Influenza due to identified novel influenza A virus with pneumonia: Secondary | ICD-10-CM | POA: Insufficient documentation

## 2024-12-13 DIAGNOSIS — F32A Depression, unspecified: Secondary | ICD-10-CM | POA: Diagnosis present

## 2024-12-13 DIAGNOSIS — E039 Hypothyroidism, unspecified: Secondary | ICD-10-CM | POA: Diagnosis present

## 2024-12-13 DIAGNOSIS — J1008 Influenza due to other identified influenza virus with other specified pneumonia: Secondary | ICD-10-CM | POA: Diagnosis present

## 2024-12-13 DIAGNOSIS — R918 Other nonspecific abnormal finding of lung field: Secondary | ICD-10-CM | POA: Insufficient documentation

## 2024-12-13 DIAGNOSIS — D631 Anemia in chronic kidney disease: Secondary | ICD-10-CM | POA: Diagnosis present

## 2024-12-13 DIAGNOSIS — J189 Pneumonia, unspecified organism: Secondary | ICD-10-CM | POA: Insufficient documentation

## 2024-12-13 DIAGNOSIS — E1122 Type 2 diabetes mellitus with diabetic chronic kidney disease: Secondary | ICD-10-CM | POA: Diagnosis present

## 2024-12-13 DIAGNOSIS — I495 Sick sinus syndrome: Secondary | ICD-10-CM | POA: Diagnosis present

## 2024-12-13 DIAGNOSIS — I255 Ischemic cardiomyopathy: Secondary | ICD-10-CM | POA: Diagnosis present

## 2024-12-13 DIAGNOSIS — F0393 Unspecified dementia, unspecified severity, with mood disturbance: Secondary | ICD-10-CM | POA: Diagnosis present

## 2024-12-13 DIAGNOSIS — I200 Unstable angina: Secondary | ICD-10-CM | POA: Diagnosis present

## 2024-12-13 DIAGNOSIS — J440 Chronic obstructive pulmonary disease with acute lower respiratory infection: Secondary | ICD-10-CM | POA: Diagnosis present

## 2024-12-13 LAB — CBC
HCT: 27.8 % — ABNORMAL LOW (ref 36.0–46.0)
Hemoglobin: 8.7 g/dL — ABNORMAL LOW (ref 12.0–15.0)
MCH: 27.6 pg (ref 26.0–34.0)
MCHC: 31.3 g/dL (ref 30.0–36.0)
MCV: 88.3 fL (ref 80.0–100.0)
Platelets: 210 K/uL (ref 150–400)
RBC: 3.15 MIL/uL — ABNORMAL LOW (ref 3.87–5.11)
RDW: 15.7 % — ABNORMAL HIGH (ref 11.5–15.5)
WBC: 5.7 K/uL (ref 4.0–10.5)
nRBC: 0 % (ref 0.0–0.2)

## 2024-12-13 LAB — HEMOGLOBIN A1C
Hgb A1c MFr Bld: 6.8 % — ABNORMAL HIGH (ref 4.8–5.6)
Mean Plasma Glucose: 148.46 mg/dL

## 2024-12-13 LAB — COMPREHENSIVE METABOLIC PANEL WITH GFR
ALT: 17 U/L (ref 0–44)
AST: 25 U/L (ref 15–41)
Albumin: 3.7 g/dL (ref 3.5–5.0)
Alkaline Phosphatase: 116 U/L (ref 38–126)
Anion gap: 15 (ref 5–15)
BUN: 44 mg/dL — ABNORMAL HIGH (ref 8–23)
CO2: 22 mmol/L (ref 22–32)
Calcium: 8.8 mg/dL — ABNORMAL LOW (ref 8.9–10.3)
Chloride: 99 mmol/L (ref 98–111)
Creatinine, Ser: 2.43 mg/dL — ABNORMAL HIGH (ref 0.44–1.00)
GFR, Estimated: 18 mL/min — ABNORMAL LOW
Glucose, Bld: 295 mg/dL — ABNORMAL HIGH (ref 70–99)
Potassium: 5 mmol/L (ref 3.5–5.1)
Sodium: 136 mmol/L (ref 135–145)
Total Bilirubin: 0.4 mg/dL (ref 0.0–1.2)
Total Protein: 6.6 g/dL (ref 6.5–8.1)

## 2024-12-13 LAB — CBG MONITORING, ED: Glucose-Capillary: 302 mg/dL — ABNORMAL HIGH (ref 70–99)

## 2024-12-13 LAB — GLUCOSE, CAPILLARY
Glucose-Capillary: 217 mg/dL — ABNORMAL HIGH (ref 70–99)
Glucose-Capillary: 158 mg/dL — ABNORMAL HIGH (ref 70–99)
Glucose-Capillary: 52 mg/dL — ABNORMAL LOW (ref 70–99)
Glucose-Capillary: 62 mg/dL — ABNORMAL LOW (ref 70–99)
Glucose-Capillary: 128 mg/dL — ABNORMAL HIGH (ref 70–99)
Glucose-Capillary: 81 mg/dL (ref 70–99)

## 2024-12-13 LAB — TSH: TSH: 4.41 u[IU]/mL (ref 0.350–4.500)

## 2024-12-13 MED ORDER — DEXTROSE 50 % IV SOLN
INTRAVENOUS | Status: AC
  Filled 2024-12-13: qty 50

## 2024-12-13 MED ORDER — LEVOTHYROXINE SODIUM 112 MCG PO TABS
112.0000 ug | ORAL_TABLET | Freq: Every day | ORAL | Status: DC
  Administered 2024-12-13 – 2024-12-18 (×6): 112 ug via ORAL
  Filled 2024-12-13 (×6): qty 1

## 2024-12-13 MED ORDER — CLOPIDOGREL BISULFATE 75 MG PO TABS
75.0000 mg | ORAL_TABLET | Freq: Every day | ORAL | Status: DC
  Administered 2024-12-13 – 2024-12-18 (×6): 75 mg via ORAL
  Filled 2024-12-13 (×6): qty 1

## 2024-12-13 MED ORDER — FLUTICASONE FUROATE-VILANTEROL 100-25 MCG/ACT IN AEPB
1.0000 | INHALATION_SPRAY | Freq: Every day | RESPIRATORY_TRACT | Status: DC
  Administered 2024-12-13 – 2024-12-18 (×6): 1 via RESPIRATORY_TRACT
  Filled 2024-12-13: qty 28

## 2024-12-13 MED ORDER — DONEPEZIL HCL 5 MG PO TABS
5.0000 mg | ORAL_TABLET | Freq: Every day | ORAL | Status: DC
  Administered 2024-12-13 – 2024-12-17 (×5): 5 mg via ORAL
  Filled 2024-12-13 (×5): qty 1

## 2024-12-13 MED ORDER — ATORVASTATIN CALCIUM 40 MG PO TABS
80.0000 mg | ORAL_TABLET | Freq: Every day | ORAL | Status: DC
  Administered 2024-12-13 – 2024-12-18 (×6): 80 mg via ORAL
  Filled 2024-12-13 (×7): qty 2

## 2024-12-13 MED ORDER — GLUCOSE 40 % PO GEL: 2.0000 | ORAL | Status: DC

## 2024-12-13 MED ORDER — DULOXETINE HCL 60 MG PO CPEP
60.0000 mg | ORAL_CAPSULE | Freq: Every day | ORAL | Status: DC
  Administered 2024-12-13 – 2024-12-18 (×6): 60 mg via ORAL
  Filled 2024-12-13 (×6): qty 1

## 2024-12-13 MED ORDER — DEXTROSE 50 % IV SOLN
25.0000 g | INTRAVENOUS | Status: AC
  Administered 2024-12-13: 25 g via INTRAVENOUS

## 2024-12-13 MED ORDER — PANTOPRAZOLE SODIUM 40 MG PO TBEC
40.0000 mg | DELAYED_RELEASE_TABLET | Freq: Every day | ORAL | Status: DC
  Administered 2024-12-13 – 2024-12-14 (×2): 40 mg via ORAL
  Filled 2024-12-13 (×2): qty 1

## 2024-12-13 MED ORDER — OSELTAMIVIR PHOSPHATE 30 MG PO CAPS
30.0000 mg | ORAL_CAPSULE | Freq: Every day | ORAL | Status: AC
  Administered 2024-12-13 – 2024-12-17 (×5): 30 mg via ORAL
  Filled 2024-12-13 (×5): qty 1

## 2024-12-13 MED ORDER — METOPROLOL TARTRATE 25 MG PO TABS
25.0000 mg | ORAL_TABLET | Freq: Two times a day (BID) | ORAL | Status: DC
  Administered 2024-12-13 – 2024-12-18 (×9): 25 mg via ORAL
  Filled 2024-12-13 (×10): qty 1

## 2024-12-13 MED ORDER — OLANZAPINE 5 MG PO TABS
5.0000 mg | ORAL_TABLET | Freq: Every day | ORAL | Status: DC
  Administered 2024-12-13 – 2024-12-17 (×5): 5 mg via ORAL
  Filled 2024-12-13 (×5): qty 1

## 2024-12-13 NOTE — Progress Notes (Signed)
 Hypoglycemic Event  CBG: 52  Treatment: 4 oz juice/soda D50/25 ml  Symptoms: None  Follow-up CBG: Time:62 CBG Result:128  Possible Reasons for Event: Unknown  Comments/MD notified:message sent    [address], [employer]

## 2024-12-13 NOTE — Progress Notes (Signed)
 " Progress Note   Patient: Janet Steele FMW:[id number] DOB: Nov 01, 1929 DOA: 12/12/2024     0 DOS: the patient was seen and examined on 12/13/2024    Brief hospital course: Janet Steele is a 89 y.o. female with history of CAD status post CABG, symptomatic bradycardia status post pacemaker placement, chronic HFrEF, dementia, diabetes mellitus type 2, chronic kidney disease stage III, chronic anemia, hypothyroidism was brought to the ER after patient was found to be short of breath and tachypneic at the urgent care.   The patient has been diagnosed with acute hypoxic respiratory failure, pneumonia, influenza A infection, CHF exacerbation.  Assessment and Plan:  Acute respiratory failure with hypoxia  No baseline oxygen use. Respiratory failure evidenced by increased work of breathing, use of accessory muscles.  Oxygen saturations remained in the 90s. Currently on 2 L/min. - Wean off oxygen as tolerated.  Influenza A infection RVP was positive for influenza A. - Continue Tamiflu   Pneumonia  Likely bacterial, organism unknown, superadded on influenza A infection. Chest CT showed left-sided opacities. - Continue ceftriaxone , azithromycin .  Acute on chronic HFrEF  Last EF measured was 40% with grade 1 diastolic dysfunction on March 7975. Patient was given a dose of IV Lasix . - Will hold off diuresing further for now given increase in creatinine.  Diabetes mellitus type 2  Presented with hyperglycemia. Patient is on glimepiride  at home. Hemoglobin A1c 6.8. - Will keep patient on sliding scale coverage for now.  CAD status post CABG  -Continue metoprolol , statin, DAPT.   Hypothyroidism Synthroid  dose was adjusted 6 months ago due to TSH >150. Provider noted patient needed to take 112 mcg twice daily for 1 month. Not clear whether patient is still taking her Synthroid  twice daily. TSH is now 4.41 - Pharmacy to verify dose of Synthroid .  In the meantime, we will continue with 112  mcg daily.  Chronic kidney disease stage III  Baseline creatinine approximately 2. Noted increase in creatinine since diuresis last night. - Hold further diuresis for now. - Avoid nephrotoxins. - Trend renal function.    Chronic anemia  Likely from renal disease - Follow CBC.  History of dementia - Continue home Aricept .  History of depression  -Resumed home Cymbalta , Zyprexa .  Lung nodules  Seen on CAT scan  -will need follow-up as outpatient.       Subjective: Patient states she is feeling a little better today.  Her son, present at bedside, also notes that she is doing better.  Physical Exam: BP (!) 109/43 (BP Location: Right Arm)   Pulse 60   Temp 98.3 F (36.8 C) (Oral)   Resp 16   Ht 5' (1.524 m)   Wt 51.9 kg   SpO2 93%   BMI 22.35 kg/m    General: Alert, awake, responsive.  Hard of hearing Eyes: Pupils equal, reactive  Oral cavity: moist mucous membranes  Head: Atraumatic, normocephalic  Neck: supple  Chest: Diminished breath sounds, basal crackles CVS: S1,S2 RRR. No murmurs  Abd: No distention, soft, non-tender. No masses palpable  Extr: No edema   MSK: No joint deformities or swelling  Neurological: Grossly intact.    Data Reviewed:    Latest Ref Rng & Units 12/13/2024    5:20 AM 12/12/2024    9:30 PM 12/12/2024    4:08 PM  CBC  WBC 4.0 - 10.5 K/uL 5.7  6.5  7.4   Hemoglobin 12.0 - 15.0 g/dL 8.7  9.2  89.7   Hematocrit 36.0 -  46.0 % 27.8  29.2  32.6   Platelets 150 - 400 K/uL 210  220  231       Latest Ref Rng & Units 12/13/2024    5:20 AM 12/12/2024    9:30 PM 12/12/2024    4:08 PM  BMP  Glucose 70 - 99 mg/dL 704   683   BUN 8 - 23 mg/dL 44   36   Creatinine 9.55 - 1.00 mg/dL 7.56  7.87  8.07   Sodium 135 - 145 mmol/L 136   138   Potassium 3.5 - 5.1 mmol/L 5.0   4.5   Chloride 98 - 111 mmol/L 99   101   CO2 22 - 32 mmol/L 22   25   Calcium  8.9 - 10.3 mg/dL 8.8   8.8      Family Communication: Spoke with son at  bedside  Disposition: Status is: Inpatient Remains inpatient appropriate because: Acute hypoxic respiratory failure, pneumonia requiring IV antibiotics, CHF exacerbation  DVT PPx: SQ heparin       Author: MDALA-GAUSI, Laurelai Lepp AGATHA, MD 12/13/2024 1:21 PM  For on call review [email or web address].    "

## 2024-12-14 DIAGNOSIS — J9601 Acute respiratory failure with hypoxia: Secondary | ICD-10-CM | POA: Diagnosis not present

## 2024-12-14 LAB — CBC
HCT: 28.7 % — ABNORMAL LOW (ref 36.0–46.0)
Hemoglobin: 9.1 g/dL — ABNORMAL LOW (ref 12.0–15.0)
MCH: 27.3 pg (ref 26.0–34.0)
MCHC: 31.7 g/dL (ref 30.0–36.0)
MCV: 86.2 fL (ref 80.0–100.0)
Platelets: 213 K/uL (ref 150–400)
RBC: 3.33 MIL/uL — ABNORMAL LOW (ref 3.87–5.11)
RDW: 15.8 % — ABNORMAL HIGH (ref 11.5–15.5)
WBC: 11 K/uL — ABNORMAL HIGH (ref 4.0–10.5)
nRBC: 0 % (ref 0.0–0.2)

## 2024-12-14 LAB — MAGNESIUM: Magnesium: 2.7 mg/dL — ABNORMAL HIGH (ref 1.7–2.4)

## 2024-12-14 LAB — BASIC METABOLIC PANEL WITH GFR
Anion gap: 14 (ref 5–15)
BUN: 57 mg/dL — ABNORMAL HIGH (ref 8–23)
CO2: 23 mmol/L (ref 22–32)
Calcium: 8.5 mg/dL — ABNORMAL LOW (ref 8.9–10.3)
Chloride: 101 mmol/L (ref 98–111)
Creatinine, Ser: 2.65 mg/dL — ABNORMAL HIGH (ref 0.44–1.00)
GFR, Estimated: 16 mL/min — ABNORMAL LOW
Glucose, Bld: 96 mg/dL (ref 70–99)
Potassium: 4.8 mmol/L (ref 3.5–5.1)
Sodium: 138 mmol/L (ref 135–145)

## 2024-12-14 LAB — GLUCOSE, CAPILLARY
Glucose-Capillary: 95 mg/dL (ref 70–99)
Glucose-Capillary: 121 mg/dL — ABNORMAL HIGH (ref 70–99)
Glucose-Capillary: 161 mg/dL — ABNORMAL HIGH (ref 70–99)
Glucose-Capillary: 236 mg/dL — ABNORMAL HIGH (ref 70–99)

## 2024-12-14 MED ORDER — LACTATED RINGERS IV BOLUS
250.0000 mL | Freq: Once | INTRAVENOUS | Status: AC
  Administered 2024-12-14: 250 mL via INTRAVENOUS

## 2024-12-14 MED ORDER — PANTOPRAZOLE SODIUM 40 MG IV SOLR
40.0000 mg | INTRAVENOUS | Status: DC
  Administered 2024-12-15 – 2024-12-17 (×3): 40 mg via INTRAVENOUS
  Filled 2024-12-14 (×3): qty 10

## 2024-12-14 NOTE — Progress Notes (Addendum)
 " Progress Note   Patient: Janet Steele FMW:[id number] DOB: 04-19-1929 DOA: 12/12/2024     1 DOS: the patient was seen and examined on 12/14/2024    Brief hospital course: Janet Steele is a 89 y.o. female with history of CAD status post CABG, symptomatic bradycardia status post pacemaker placement, chronic HFrEF, dementia, diabetes mellitus type 2, chronic kidney disease stage III, chronic anemia, hypothyroidism was brought to the ER after patient was found to be short of breath and tachypneic at the urgent care.   The patient has been diagnosed with acute hypoxic respiratory failure, pneumonia, influenza A infection, CHF exacerbation.  Assessment and Plan:  Acute respiratory failure with hypoxia  No baseline oxygen use. Respiratory failure evidenced by increased work of breathing, use of accessory muscles.  Oxygen saturations remained in the 90s. Currently on 2 L/min. - Wean off oxygen as tolerated.  Influenza A infection RVP was positive for influenza A. - Continue Tamiflu   Pneumonia  Likely bacterial, organism unknown, superadded on influenza A infection. Chest CT showed left-sided opacities. - Continue ceftriaxone , azithromycin .  Acute on chronic HFrEF  Last EF measured was 40% with grade 1 diastolic dysfunction on March 7975. Patient was given a dose of IV Lasix . - Will hold off diuresing further for now given increase in creatinine.  Diabetes mellitus type 2  Presented with hyperglycemia. Patient is on glimepiride  at home. Hemoglobin A1c 6.8. - Will keep patient on sliding scale coverage for now.  CAD status post CABG  -Continue metoprolol , statin, DAPT.   Hypothyroidism Synthroid  dose was adjusted 6 months ago due to TSH >150. Provider noted patient needed to take 112 mcg twice daily for 1 month. Not clear whether patient is still taking her Synthroid  twice daily. TSH is now 4.41 - Pharmacy to verify dose of Synthroid .  In the meantime, we will continue with 112  mcg daily.  AKI on Chronic kidney disease stage IV Baseline creatinine approximately 2. Noted increase in creatinine since diuresis on admission, with creatinine of 2.65 on 1/9. - Hold further diuresis for now. - Avoid nephrotoxins. - Trend renal function.    Chronic anemia  Likely from renal disease - Follow CBC.  History of dementia - Continue home Aricept .  History of depression  -Resumed home Cymbalta , Zyprexa .  Lung nodules  Seen on CAT scan  -will need follow-up as outpatient.       Subjective: Patient and son note she seems to be improving.   Physical Exam: BP (!) 95/42 (BP Location: Left Arm)   Pulse (!) 59   Temp 98.5 F (36.9 C) (Oral)   Resp 18   Ht 5' (1.524 m)   Wt 51.9 kg   SpO2 98%   BMI 22.35 kg/m    General: Alert, awake, responsive.  Hard of hearing Eyes: Pupils equal, reactive  Oral cavity: moist mucous membranes  Head: Atraumatic, normocephalic  Neck: supple  Chest: Diminished breath sounds, basal crackles CVS: S1,S2 RRR. No murmurs  Abd: No distention, soft, non-tender. No masses palpable  Extr: No edema   MSK: No joint deformities or swelling  Neurological: Grossly intact.    Data Reviewed:    Latest Ref Rng & Units 12/14/2024    4:22 AM 12/13/2024    5:20 AM 12/12/2024    9:30 PM  CBC  WBC 4.0 - 10.5 K/uL 11.0  5.7  6.5   Hemoglobin 12.0 - 15.0 g/dL 9.1  8.7  9.2   Hematocrit 36.0 - 46.0 % 28.7  27.8  29.2   Platelets 150 - 400 K/uL 213  210  220       Latest Ref Rng & Units 12/14/2024    4:22 AM 12/13/2024    5:20 AM 12/12/2024    9:30 PM  BMP  Glucose 70 - 99 mg/dL 96  704    BUN 8 - 23 mg/dL 57  44    Creatinine 9.55 - 1.00 mg/dL 7.34  7.56  7.87   Sodium 135 - 145 mmol/L 138  136    Potassium 3.5 - 5.1 mmol/L 4.8  5.0    Chloride 98 - 111 mmol/L 101  99    CO2 22 - 32 mmol/L 23  22    Calcium  8.9 - 10.3 mg/dL 8.5  8.8       Family Communication: Spoke with son at bedside  Disposition: Status is: Inpatient Remains  inpatient appropriate because: Acute hypoxic respiratory failure, pneumonia requiring IV antibiotics, CHF exacerbation  DVT PPx: SQ heparin       Author: MDALA-GAUSI, Edit Ricciardelli AGATHA, MD 12/14/2024 3:00 PM  For on call review [email or web address].    "

## 2024-12-14 NOTE — Progress Notes (Signed)
 Heart Failure Navigator Progress Note  Assessed for Heart & Vascular TOC clinic readiness.  Patient does not meet criteria due to per MD note patient with history of Dementia. No HF TOC. .   Navigator will sign off at this time.   Randie Bustle, BSN, Scientist, clinical (histocompatibility and immunogenetics) Only

## 2024-12-14 NOTE — Plan of Care (Signed)
" °  Problem: Elimination: Goal: Will not experience complications related to urinary retention Outcome: Progressing   Problem: Elimination: Goal: Will not experience complications related to bowel motility Outcome: Progressing   Problem: Pain Managment: Goal: General experience of comfort will improve and/or be controlled Outcome: Progressing   "

## 2024-12-15 ENCOUNTER — Other Ambulatory Visit: Payer: Self-pay

## 2024-12-15 DIAGNOSIS — J9601 Acute respiratory failure with hypoxia: Secondary | ICD-10-CM | POA: Diagnosis not present

## 2024-12-15 LAB — MAGNESIUM: Magnesium: 2.6 mg/dL — ABNORMAL HIGH (ref 1.7–2.4)

## 2024-12-15 LAB — CBC
HCT: 25.4 % — ABNORMAL LOW (ref 36.0–46.0)
Hemoglobin: 8.1 g/dL — ABNORMAL LOW (ref 12.0–15.0)
MCH: 27.4 pg (ref 26.0–34.0)
MCHC: 31.9 g/dL (ref 30.0–36.0)
MCV: 85.8 fL (ref 80.0–100.0)
Platelets: 186 K/uL (ref 150–400)
RBC: 2.96 MIL/uL — ABNORMAL LOW (ref 3.87–5.11)
RDW: 15.6 % — ABNORMAL HIGH (ref 11.5–15.5)
WBC: 6.5 K/uL (ref 4.0–10.5)
nRBC: 0 % (ref 0.0–0.2)

## 2024-12-15 LAB — BASIC METABOLIC PANEL WITH GFR
Anion gap: 11 (ref 5–15)
BUN: 62 mg/dL — ABNORMAL HIGH (ref 8–23)
CO2: 21 mmol/L — ABNORMAL LOW (ref 22–32)
Calcium: 8 mg/dL — ABNORMAL LOW (ref 8.9–10.3)
Chloride: 106 mmol/L (ref 98–111)
Creatinine, Ser: 2.21 mg/dL — ABNORMAL HIGH (ref 0.44–1.00)
GFR, Estimated: 20 mL/min — ABNORMAL LOW
Glucose, Bld: 102 mg/dL — ABNORMAL HIGH (ref 70–99)
Potassium: 4.7 mmol/L (ref 3.5–5.1)
Sodium: 138 mmol/L (ref 135–145)

## 2024-12-15 LAB — GLUCOSE, CAPILLARY
Glucose-Capillary: 106 mg/dL — ABNORMAL HIGH (ref 70–99)
Glucose-Capillary: 232 mg/dL — ABNORMAL HIGH (ref 70–99)
Glucose-Capillary: 139 mg/dL — ABNORMAL HIGH (ref 70–99)
Glucose-Capillary: 119 mg/dL — ABNORMAL HIGH (ref 70–99)

## 2024-12-15 MED ORDER — INSULIN ASPART 100 UNIT/ML IJ SOLN
0.0000 [IU] | Freq: Three times a day (TID) | INTRAMUSCULAR | Status: DC
  Administered 2024-12-16: 1 [IU] via SUBCUTANEOUS
  Administered 2024-12-17: 2 [IU] via SUBCUTANEOUS
  Filled 2024-12-15: qty 2
  Filled 2024-12-15: qty 1

## 2024-12-15 MED ORDER — INSULIN ASPART 100 UNIT/ML IJ SOLN: 0.0000 [IU] | Freq: Every day | INTRAMUSCULAR | Status: DC

## 2024-12-15 NOTE — TOC Progression Note (Signed)
 Transition of Care [facility]) - Progression Note    Patient Details  Name: Janet Steele MRN: [id number] Date of Birth: June 21, 1929  Transition of Care [facility]) CM/SW Contact  Lorraine LILLETTE Fenton, LCSW Phone Number: 12/15/2024, 2:44 PM  Clinical Narrative:     Pt was being considered for DC today, then MD determined not ready for DC.  ICM following.  Anticipated DC Monday.     Barriers to Discharge: Continued Medical Work up               Expected Discharge Plan and Services                                               Social Drivers of Health (SDOH) Interventions SDOH Screenings   Food Insecurity: No Food Insecurity (12/13/2024)  Housing: Low Risk (12/13/2024)  Transportation Needs: No Transportation Needs (12/13/2024)  Utilities: Not At Risk (12/13/2024)  Alcohol Screen: Low Risk (05/16/2024)  Depression (PHQ2-9): Medium Risk (05/16/2024)  Financial Resource Strain: Low Risk (05/16/2024)  Physical Activity: Inactive (05/16/2024)  Social Connections: Moderately Isolated (12/13/2024)  Stress: No Stress Concern Present (05/16/2024)  Tobacco Use: Medium Risk (12/12/2024)  Health Literacy: Inadequate Health Literacy (05/16/2024)    Readmission Risk Interventions     No data to display

## 2024-12-15 NOTE — Plan of Care (Signed)
   Problem: Coping: Goal: Level of anxiety will decrease Outcome: Progressing   Problem: Safety: Goal: Ability to remain free from injury will improve Outcome: Progressing   Problem: Skin Integrity: Goal: Risk for impaired skin integrity will decrease Outcome: Progressing

## 2024-12-15 NOTE — Plan of Care (Signed)
" °  Problem: Metabolic: Goal: Ability to maintain appropriate glucose levels will improve Outcome: Progressing   Problem: Nutritional: Goal: Maintenance of adequate nutrition will improve Outcome: Progressing   Problem: Skin Integrity: Goal: Risk for impaired skin integrity will decrease Outcome: Progressing   Problem: Clinical Measurements: Goal: Cardiovascular complication will be avoided Outcome: Progressing   Problem: Clinical Measurements: Goal: Respiratory complications will improve Outcome: Progressing   "

## 2024-12-15 NOTE — Evaluation (Signed)
 Physical Therapy Evaluation Patient Details Name: Janet Steele MRN: [id number] DOB: March 29, 1929 Today's Date: 12/15/2024  History of Present Illness  Janet Steele is a 89 y.o. female was brought to the ER after patient was found to be short of breath and tachypneic at the urgent care.The patient has been diagnosed with acute hypoxic respiratory failure, pneumonia, influenza A infection, CHF exacerbation.   PMH:CAD status post CABG, symptomatic bradycardia status post pacemaker placement, chronic HFrEF, dementia, diabetes mellitus type 2, chronic kidney disease stage III, chronic anemia, hypothyroidism  Clinical Impression  Pt admitted with above diagnosis.  Pt currently with functional limitations due to the deficits listed below (see PT Problem List). Pt will benefit from acute skilled PT to increase their independence and safety with mobility to allow discharge.    The patient  is alert, requiring max assistance of 2 for stnading, use of STEDY lift for safer and easier transfer back to bed.  Per RN, son reports patient was ambulatory at the ALF, unsure if required assistance.  Patient  states that she did not walk.  Patient's spo2 difficult to  read on fingers, 100% on right 2nd toe before and after standing and transfer to bed with lift on RA.Janet Steele  Unable to perform a formal ambulatory saturation being unable  to ambulate  Patient will benefit from continued inpatient follow up therapy, <3 hours/day unless the Facility can provide care at current max level of care, recommend HHPT if returns to ALF.          If plan is discharge home, recommend the following: Two people to help with walking and/or transfers;A lot of help with bathing/dressing/bathroom   Can travel by private vehicle   No    Equipment Recommendations None recommended by PT  Recommendations for Other Services       Functional Status Assessment Patient has had a recent decline in their functional status and demonstrates  the ability to make significant improvements in function in a reasonable and predictable amount of time.     Precautions / Restrictions Precautions Precautions: Fall Precaution/Restrictions Comments: incontinent      Mobility  Bed Mobility Overal bed mobility: Needs Assistance Bed Mobility: Sit to Supine       Sit to supine: Max assist   General bed mobility comments: assist legs  and trunk to supine    Transfers Overall transfer level: Needs assistance Equipment used: Rolling walker (2 wheels) Transfers: Sit to/from Stand, Bed to chair/wheelchair/BSC Sit to Stand: Max assist           General transfer comment: attempted to stand  at Rw, patient unable, Stood in STEDY lift x 2 with max assist to power up, trnafer to bed via lift. Transfer via Lift Equipment: Stedy  Ambulation/Gait                  Stairs            Wheelchair Mobility     Tilt Bed    Modified Rankin (Stroke Patients Only)       Balance Overall balance assessment: Needs assistance Sitting-balance support: Bilateral upper extremity supported, Feet supported Sitting balance-Leahy Scale: Fair     Standing balance support: Bilateral upper extremity supported, During functional activity, Reliant on assistive device for balance Standing balance-Leahy Scale: Poor                               Pertinent Vitals/Pain  Pain Assessment Pain Assessment: No/denies pain    Home Living Family/patient expects to be discharged to:: Assisted living                        Prior Function               Mobility Comments: per RN per son, was able to ambulate at facility, not sure if independnet or w/ assist, pt. unable to state ADLs Comments: unsure, assume staff assists     Extremity/Trunk Assessment   Upper Extremity Assessment Upper Extremity Assessment: Generalized weakness    Lower Extremity Assessment Lower Extremity Assessment: Generalized weakness     Cervical / Trunk Assessment Cervical / Trunk Assessment: Kyphotic  Communication   Communication Communication: Impaired Factors Affecting Communication: Hearing impaired    Cognition Arousal: Alert Behavior During Therapy: WFL for tasks assessed/performed   PT - Cognitive impairments: No family/caregiver present to determine baseline                       PT - Cognition Comments: oriented to self         Cueing Cueing Techniques: Tactile cues, Verbal cues     General Comments      Exercises     Assessment/Plan    PT Assessment Patient needs continued PT services  PT Problem List Decreased strength;Decreased balance;Decreased mobility;Decreased cognition       PT Treatment Interventions DME instruction;Gait training;Functional mobility training;Therapeutic activities;Therapeutic exercise;Patient/family education    PT Goals (Current goals can be found in the Care Plan section)  Acute Rehab PT Goals PT Goal Formulation: Patient unable to participate in goal setting Time For Goal Achievement: 12/29/24 Potential to Achieve Goals: Fair    Frequency Min 2X/week     Co-evaluation               AM-PAC PT 6 Clicks Mobility  Outcome Measure Help needed turning from your back to your side while in a flat bed without using bedrails?: A Lot Help needed moving from lying on your back to sitting on the side of a flat bed without using bedrails?: A Lot Help needed moving to and from a bed to a chair (including a wheelchair)?: Total Help needed standing up from a chair using your arms (e.g., wheelchair or bedside chair)?: Total Help needed to walk in hospital room?: Total Help needed climbing 3-5 steps with a railing? : Total 6 Click Score: 8    End of Session Equipment Utilized During Treatment: Gait belt Activity Tolerance: Patient tolerated treatment well Patient left: in bed;with call bell/phone within reach;with bed alarm set Nurse  Communication: Mobility status PT Visit Diagnosis: Unsteadiness on feet (R26.81);Muscle weakness (generalized) (M62.81)    Time: 8576-8560 PT Time Calculation (min) (ACUTE ONLY): 16 min   Charges:   PT Evaluation $PT Eval Low Complexity: 1 Low   PT General Charges $$ ACUTE PT VISIT: 1 Visit         Janet Steele PT Acute Rehabilitation Services Office [phone]   Janet Steele 12/15/2024, 3:15 PM

## 2024-12-15 NOTE — Progress Notes (Addendum)
 " Progress Note   Patient: Janet Steele FMW:[id number] DOB: Jun 19, 1929 DOA: 12/12/2024     2 DOS: the patient was seen and examined on 12/15/2024    Brief hospital course: BRAYLEY MACKOWIAK is a 89 y.o. female with history of CAD status post CABG, symptomatic bradycardia status post pacemaker placement, chronic HFrEF, dementia, diabetes mellitus type 2, chronic kidney disease stage III, chronic anemia, hypothyroidism was brought to the ER after patient was found to be short of breath and tachypneic at the urgent care.   The patient has been diagnosed with acute hypoxic respiratory failure, pneumonia, influenza A infection, CHF exacerbation.  Assessment and Plan:  Weakness Deconditioning due to acute illness.  Patient unable to walk on 1/10, which is not her baseline.  - PT/OT evaluation.  Acute respiratory failure with hypoxia  No baseline oxygen use. Respiratory failure evidenced by increased work of breathing, use of accessory muscles.  Oxygen saturations remained in the 90s. Currently on 2 L/min. - Ambulatory oxygen testing.   Influenza A infection RVP was positive for influenza A. - Continue Tamiflu , with end date 12/17/2024.  Pneumonia  Likely bacterial, organism unknown, superadded on influenza A infection. Chest CT showed left-sided opacities. Received azithromycin  for 3 days.  - Continue ceftriaxone  and transition to Keflex  at discharge to complete a 5-day course of antibiotics (end date 1/11).  Acute on chronic HFrEF  Last EF measured was 40% with grade 1 diastolic dysfunction on March 7975. Patient was given a dose of IV Lasix . - Will hold off diuresing further for now given increase in creatinine.  Diabetes mellitus type 2  Presented with hyperglycemia. Patient is on glimepiride  at home. Hemoglobin A1c 6.8. - Will keep patient on sliding scale coverage for now - low dose due to hypoglycemia on 1/8.  CAD status post CABG  -Continue metoprolol , statin, DAPT.    Hypothyroidism Synthroid  dose was adjusted 6 months ago due to TSH >150. Provider noted patient needed to take 112 mcg twice daily for 1 month. Patient appears to be back on 112 mcg daily, per fill history. TSH is now 4.41 - Continue with 112 mcg daily.  AKI on Chronic kidney disease stage IV Baseline creatinine approximately 2. Noted increase in creatinine after diuresis on admission, with creatinine of 2.65 on 1/9. Creatinine trending down with holding diuresis. - Continue to hold further diuresis for now. - Avoid nephrotoxins. - Trend renal function.    Chronic anemia  Likely from renal disease - Follow CBC.  History of dementia - Continue home Aricept .  History of depression  -Resumed home Cymbalta , Zyprexa .  Lung nodules  Seen on CAT scan  -will need follow-up as outpatient.       Subjective: Patient has no new complaints. Noted by staff to not be able to walk.  Physical Exam: BP (!) 126/59 (BP Location: Left Arm)   Pulse (!) 59   Temp 98.5 F (36.9 C) (Oral)   Resp 20   Ht 5' (1.524 m)   Wt 51.9 kg   SpO2 93%   BMI 22.35 kg/m    General: Alert, awake, responsive.  Hard of hearing Eyes: Pupils equal, reactive  Oral cavity: moist mucous membranes  Head: Atraumatic, normocephalic  Neck: supple  Chest: Diminished breath sounds, basal crackles CVS: S1,S2 RRR. No murmurs  Abd: No distention, soft, non-tender. No masses palpable  Extr: No edema   MSK: No joint deformities or swelling  Neurological: Grossly intact.    Data Reviewed:  Latest Ref Rng & Units 12/15/2024    4:25 AM 12/14/2024    4:22 AM 12/13/2024    5:20 AM  CBC  WBC 4.0 - 10.5 K/uL 6.5  11.0  5.7   Hemoglobin 12.0 - 15.0 g/dL 8.1  9.1  8.7   Hematocrit 36.0 - 46.0 % 25.4  28.7  27.8   Platelets 150 - 400 K/uL 186  213  210       Latest Ref Rng & Units 12/15/2024    4:25 AM 12/14/2024    4:22 AM 12/13/2024    5:20 AM  BMP  Glucose 70 - 99 mg/dL 897  96  704   BUN 8 - 23 mg/dL 62   57  44   Creatinine 0.44 - 1.00 mg/dL 7.78  7.34  7.56   Sodium 135 - 145 mmol/L 138  138  136   Potassium 3.5 - 5.1 mmol/L 4.7  4.8  5.0   Chloride 98 - 111 mmol/L 106  101  99   CO2 22 - 32 mmol/L 21  23  22    Calcium  8.9 - 10.3 mg/dL 8.0  8.5  8.8      Family Communication: Spoke with son on the phone  Disposition: Status is: Inpatient Remains inpatient appropriate because: Acute hypoxic respiratory failure, pneumonia requiring IV antibiotics, CHF exacerbation, deconditioning  DVT PPx: SQ heparin  (on hold due to bruising)      Author: MDALA-GAUSI, Mardi Cannady AGATHA, MD 12/15/2024 12:47 PM  For on call review [email or web address].    "

## 2024-12-16 DIAGNOSIS — J9601 Acute respiratory failure with hypoxia: Secondary | ICD-10-CM | POA: Diagnosis not present

## 2024-12-16 LAB — CBC
HCT: 25.8 % — ABNORMAL LOW (ref 36.0–46.0)
Hemoglobin: 8.2 g/dL — ABNORMAL LOW (ref 12.0–15.0)
MCH: 27.2 pg (ref 26.0–34.0)
MCHC: 31.8 g/dL (ref 30.0–36.0)
MCV: 85.7 fL (ref 80.0–100.0)
Platelets: 189 K/uL (ref 150–400)
RBC: 3.01 MIL/uL — ABNORMAL LOW (ref 3.87–5.11)
RDW: 15.8 % — ABNORMAL HIGH (ref 11.5–15.5)
WBC: 4.8 K/uL (ref 4.0–10.5)
nRBC: 0 % (ref 0.0–0.2)

## 2024-12-16 LAB — BASIC METABOLIC PANEL WITH GFR
Anion gap: 12 (ref 5–15)
BUN: 46 mg/dL — ABNORMAL HIGH (ref 8–23)
CO2: 22 mmol/L (ref 22–32)
Calcium: 8.3 mg/dL — ABNORMAL LOW (ref 8.9–10.3)
Chloride: 105 mmol/L (ref 98–111)
Creatinine, Ser: 1.92 mg/dL — ABNORMAL HIGH (ref 0.44–1.00)
GFR, Estimated: 24 mL/min — ABNORMAL LOW
Glucose, Bld: 111 mg/dL — ABNORMAL HIGH (ref 70–99)
Potassium: 4.5 mmol/L (ref 3.5–5.1)
Sodium: 138 mmol/L (ref 135–145)

## 2024-12-16 LAB — GLUCOSE, CAPILLARY
Glucose-Capillary: 98 mg/dL (ref 70–99)
Glucose-Capillary: 175 mg/dL — ABNORMAL HIGH (ref 70–99)
Glucose-Capillary: 120 mg/dL — ABNORMAL HIGH (ref 70–99)
Glucose-Capillary: 153 mg/dL — ABNORMAL HIGH (ref 70–99)

## 2024-12-16 LAB — MAGNESIUM: Magnesium: 2.6 mg/dL — ABNORMAL HIGH (ref 1.7–2.4)

## 2024-12-16 NOTE — Plan of Care (Signed)
  Problem: Skin Integrity: Goal: Risk for impaired skin integrity will decrease Outcome: Progressing   Problem: Safety: Goal: Ability to remain free from injury will improve Outcome: Progressing   Problem: Skin Integrity: Goal: Risk for impaired skin integrity will decrease Outcome: Progressing

## 2024-12-16 NOTE — TOC Progression Note (Signed)
 Transition of Care [facility]) - Progression Note    Patient Details  Name: Janet Steele MRN: [id number] Date of Birth: 1929/07/06  Transition of Care [facility]) CM/SW Contact  Lorraine LILLETTE Fenton, LCSW Phone Number: 12/16/2024, 8:36 AM  Clinical Narrative:     CSW received a Marshall from MD 1/11- indicating he had a discussion with son regarding SNF requirements . Son reportedly confident that the ALF can offer enough therapy. Agreed that it would be prudent to wait till Monday discharge.    Expected Discharge Plan: Assisted Living ([facility]) Barriers to Discharge: Continued Medical Work up               Expected Discharge Plan and Services                                               Social Drivers of Health (SDOH) Interventions SDOH Screenings   Food Insecurity: Low Risk (12/14/2024)   Received from [facility]  Housing: Low Risk (12/14/2024)   Received from [facility]  Transportation Needs: No Transportation Needs (12/14/2024)   Received from [facility]  Utilities: Low Risk (12/14/2024)   Received from [facility]  Alcohol Screen: Low Risk (05/16/2024)  Depression (PHQ2-9): Medium Risk (05/16/2024)  Financial Resource Strain: Low Risk (05/16/2024)  Physical Activity: Inactive (05/16/2024)  Social Connections: Moderately Isolated (12/13/2024)  Stress: No Stress Concern Present (05/16/2024)  Tobacco Use: Medium Risk (12/12/2024)  Health Literacy: Inadequate Health Literacy (05/16/2024)    Readmission Risk Interventions     No data to display

## 2024-12-16 NOTE — Evaluation (Signed)
 Occupational Therapy Evaluation Patient Details Name: Janet Steele MRN: [id number] DOB: 03-01-1929 Today's Date: 12/16/2024   History of Present Illness   Janet Steele is a 89 y.o. female was brought to the ER after patient was found to be short of breath and tachypneic at the urgent care.The patient has been diagnosed with acute hypoxic respiratory failure, pneumonia, influenza A infection, CHF exacerbation.   PMH:CAD status post CABG, symptomatic bradycardia status post pacemaker placement, chronic HFrEF, dementia, diabetes mellitus type 2, chronic kidney disease stage III, chronic anemia, hypothyroidism     Clinical Impressions Attempted to call son for PLOF however unable to connect. Per chart, pt from ALF however unsure of required level of care. Required Max A +2 to stand and use of Stedy to transfer to chair. Able to feed self and complete grooming tasks with set up, however requires max to total A with other ADL tasks due to below listed deficits. If Janet Steele is able to assist at this level, recommend return to ALF with HHOT, however if not, pt will benefit from continued inpatient follow up therapy, <3 hours/day, Acute OT will follow. VSS on RA.      If plan is discharge home, recommend the following:   Two people to help with walking and/or transfers;A lot of help with bathing/dressing/bathroom     Functional Status Assessment   Patient has had a recent decline in their functional status and demonstrates the ability to make significant improvements in function in a reasonable and predictable amount of time.     Equipment Recommendations   None recommended by OT     Recommendations for Other Services         Precautions/Restrictions   Precautions Precautions: Fall Precaution/Restrictions Comments: incontinent Restrictions Weight Bearing Restrictions Per Provider Order: No     Mobility Bed Mobility Overal bed mobility: Needs Assistance Bed Mobility: Sit  to Supine       Sit to supine: Max assist   General bed mobility comments: assist legs  and trunk to supine    Transfers Overall transfer level: Needs assistance Equipment used: Rolling walker (2 wheels) Transfers: Sit to/from Stand, Bed to chair/wheelchair/BSC Sit to Stand: Max assist           General transfer comment: attempted to stand  at Rw, patient unable, Stood in STEDY lift x 2 with max assist to power up, trnafer to bed via lift. Transfer via Lift Equipment: Stedy    Balance Overall balance assessment: Needs assistance Sitting-balance support: Bilateral upper extremity supported, Feet supported Sitting balance-Leahy Scale: Fair     Standing balance support: Bilateral upper extremity supported, During functional activity, Reliant on assistive device for balance Standing balance-Leahy Scale: Poor- significant posterior lean                             ADL either performed or assessed with clinical judgement   ADL Overall ADL's : Needs assistance/impaired Eating/Feeding: Set up;Sitting   Grooming: Set up;Sitting   Upper Body Bathing: Minimal assistance;Sitting   Lower Body Bathing: Maximal assistance;Sit to/from stand   Upper Body Dressing : Moderate assistance;Sitting   Lower Body Dressing: Maximal assistance;Sit to/from stand Lower Body Dressing Details (indicate cue type and reason): able to to attempt figure four to put on familiar bedroom slippers however required assistance to donn             Functional mobility during ADLs: Maximal assistance;+2 for physical assistance (  post lean; unableto achiee full upright; Stey used)       Vision   Additional Comments: able to see breakfast tray OK     Perception         Praxis         Pertinent Vitals/Pain Pain Assessment Pain Assessment: Faces Faces Pain Scale: No hurt     Extremity/Trunk Assessment Upper Extremity Assessment Upper Extremity Assessment: Generalized weakness    Lower Extremity Assessment Lower Extremity Assessment: Defer to PT evaluation   Cervical / Trunk Assessment Cervical / Trunk Assessment: Kyphotic   Communication Communication Communication: Impaired Factors Affecting Communication: Hearing impaired   Cognition Arousal: Lethargic Behavior During Therapy: WFL for tasks assessed/performed Cognition: No family/caregiver present to determine baseline, Cognition impaired   Orientation impairments: Place, Time, Situation Awareness: Intellectual awareness impaired, Online awareness impaired Memory impairment (select all impairments): Short-term memory, Working civil service fast streamer, Engineer, structural memory Attention impairment (select first level of impairment): Sustained attention Executive functioning impairment (select all impairments): Initiation, Organization, Sequencing, Reasoning, Problem solving                   Following commands: Impaired Following commands impaired: Follows one step commands inconsistently     Cueing  General Comments   Cueing Techniques: Tactile cues;Verbal cues   Enjoys talking about her jewelry   Exercises     Shoulder Instructions      Home Living Family/patient expects to be discharged to:: Assisted living (?Independent Living @ [facility]?)                                        Prior Functioning/Environment               Mobility Comments: per RN per son, was able to ambulate at facility, not sure if independent or w/ assist, pt. unable to state ADLs Comments: unsure, assume staff assists    OT Problem List: Decreased strength;Decreased range of motion;Decreased activity tolerance;Impaired balance (sitting and/or standing);Decreased cognition;Decreased safety awareness;Decreased knowledge of use of DME or AE;Cardiopulmonary status limiting activity   OT Treatment/Interventions: Self-care/ADL training;Therapeutic exercise;Energy conservation;DME and/or AE  instruction;Therapeutic activities;Cognitive remediation/compensation;Patient/family education;Balance training      OT Goals(Current goals can be found in the care plan section)   Acute Rehab OT Goals Patient Stated Goal: none stated OT Goal Formulation: Patient unable to participate in goal setting Time For Goal Achievement: 12/30/24 Potential to Achieve Goals: Fair   OT Frequency:  Min 2X/week    Co-evaluation              AM-PAC OT 6 Clicks Daily Activity     Outcome Measure Help from another person eating meals?: A Little Help from another person taking care of personal grooming?: A Little Help from another person toileting, which includes using toliet, bedpan, or urinal?: Total Help from another person bathing (including washing, rinsing, drying)?: A Lot Help from another person to put on and taking off regular upper body clothing?: A Lot Help from another person to put on and taking off regular lower body clothing?: A Lot 6 Click Score: 13   End of Session Equipment Utilized During Treatment: Gait belt Nurse Communication: Mobility status;Need for lift equipment  Activity Tolerance: Patient tolerated treatment well Patient left: in chair;with call bell/phone within reach;with chair alarm set  OT Visit Diagnosis: Unsteadiness on feet (R26.81);Other abnormalities of gait and mobility (R26.89);Muscle weakness (generalized) (M62.81);Other  symptoms and signs involving cognitive function                Time: 9079-9055 OT Time Calculation (min): 24 min Charges:  OT General Charges $OT Visit: 1 Visit OT Evaluation $OT Eval Low Complexity: 1 Low OT Treatments $Self Care/Home Management : 8-22 mins  Kreg Sink, OT/L   Acute OT Clinical Specialist Acute Rehabilitation Services Pager [phone] Office [phone]   [facility] 12/16/2024, 9:56 AM

## 2024-12-16 NOTE — Progress Notes (Signed)
 " PROGRESS NOTE Janet DAUGHTREY  FMW:[id number] DOB: 10-26-1929 DOA: 12/12/2024 PCP: Geofm Steele PARAS, MD  Brief Narrative/Hospital Course: Janet Steele is a 89 y.o. female with PMH of CAD status post CABG, symptomatic bradycardia status post pacemaker placement, chronic HFrEF, dementia, diabetes mellitus type 2, chronic kidney disease stage III vs 4, chronic anemia, hypothyroidism was brought to the ER after patient was found to be short of breath and tachypneic at the urgent care. In the ED patient was hypoxic needing 4 L oxygen,CT chest shows >concerning for left upper lobe and lingular pneumonia. Influenza test positive.  Patient's proBNP is 5600 troponins are flat at 21 and 20.  Patient also had features of possible fluid overload was given one dose of Lasix   Patient was admitted for acute hypoxic respiratory failure, pneumonia, influenza A infection, CHF exacerbation.  Subjective: Seen and examined today Resting comfortably in the bedside chair On room air mild wheezing on the left lung Overnight afebrile, VSS,  on RA  Labs creatinine improving 2.2> 1.9 B/L ~ 2-2.3 in June 25 , Hb about the same 8.2  Assessment and plan:  Acute respiratory failure with hypoxia Left upper lobe and lingular pneumonia Influenza A infection: Patient presented with shortness of air tachypnea hypoxia workup with hypoxia secondary to flu A and pneumonia Patient received azithromycin  x 3 days, continue ceftriaxone  to complete 5 days course.  Continue Tamiflu  EOT 1/12 OOB PT OT incentive spirometry pulmonary toileting to continue.  Now off oxygen and on room air with slight wheezing on the left side  Acute on chronic HFrEF: Last EF 40% and G1 DD in March 24, got 1 dose of Lasix  with improvement, given elevated creatinine holding further Lasix , CKD appears to be at baseline.  Not on diuretics at home.  Continue Toprol . Net IO Since Admission: 491.3 mL [12/16/24 1155]   Hypothyroidism: TSH is now 4.41 ,Continue  home Synthroid .  Being followed by PCP to adjust dose.  HLD Continue statin  Hiatal hernia: Continue PPI  T2 DM: PTA glimepiride , blood sugar well-controlled continue SSI  CAD S/P cabg Pacemaker-Medtronic Sick sinus syndrome: Continue patient's aspirin  Plavix  statin  CKD stage IV: creatinine improving 2.2> 1.9 B/L ~ 2-2.3 in June 25.  Monitor labs Recent Labs    04/27/24 1603 05/16/24 1455 12/12/24 1608 12/12/24 2130 12/13/24 0520 12/14/24 0422 12/15/24 0425 12/16/24 0358  BUN 25* 26* 36*  --  44* 57* 62* 46*  CREATININE 2.30* 2.16* 1.92* 2.12* 2.43* 2.65* 2.21* 1.92*  CO2 28 25 25   --  22 23 21* 22  K 4.6 4.1 4.5  --  5.0 4.8 4.7 4.5    Dementia with behavioral disturbance Depression: Continue home Zyprexa , Cymbalta , Aricept   Anemia of chronic renal disease  Stable hb.  Transfuse if less than 7  Lung nodules: Numerous scattered diffuse pulmonary nodules 4 m or less will need follow-up in short interval from PCP if interested  Deconditioning/debility PT Orders: Active PT Follow up Rec: Skilled Nursing-Short Term Rehab (<3 Hours/Day)12/15/2024 1513   DVT prophylaxis: Place and maintain sequential compression device Start: 12/16/24 0903Heparin held due to bruising, add SCD Code Status:   Code Status: Limited: Do not attempt resuscitation (DNR) -DNR-LIMITED -Do Not Intubate/DNI  Family Communication: plan of care discussed with patient at bedside. Patient status is: Remains hospitalized because of severity of illness Level of care: Telemetry   Dispo: The patient is from: ALF            Anticipated disposition: pending  SNF.  May be able to return to ALF on Monday Objective: Vitals last 24 hrs: Vitals:   12/15/24 2122 12/16/24 0412 12/16/24 0851 12/16/24 0944  BP: (!) 105/50 (!) 104/53  127/60  Pulse: 61 (!) 59  62  Resp: (!) 24 16    Temp: 98.5 F (36.9 C) 98 F (36.7 C)    TempSrc: Oral Oral    SpO2: 90% 93% 94%   Weight:      Height:        Physical  Examination: General exam: alert awake, oriented, older than stated age HEENT:Oral mucosa moist, Ear/Nose WNL grossly Respiratory system: Bilaterally clear BS-with wheezing on the left lung Cardiovascular system: S1 & S2 +, No JVD. Gastrointestinal system: Abdomen soft,NT,ND, BS+ Nervous System: Alert, awake, moving all extremities,and following commands. Extremities: extremities warm, leg edema negative Skin: Warm, no rashes MSK: Normal muscle bulk,tone, power   Medications reviewed:  Scheduled Meds:  aspirin   81 mg Oral Daily   atorvastatin   80 mg Oral Daily   clopidogrel   75 mg Oral Daily   donepezil   5 mg Oral QHS   DULoxetine   60 mg Oral Daily   fluticasone  furoate-vilanterol  1 puff Inhalation Daily   insulin  aspart  0-5 Units Subcutaneous QHS   insulin  aspart  0-6 Units Subcutaneous TID WC   levothyroxine   112 mcg Oral Q0600   metoprolol  tartrate  25 mg Oral BID   OLANZapine   5 mg Oral Daily   oseltamivir   30 mg Oral Daily   pantoprazole  (PROTONIX ) IV  40 mg Intravenous Q24H   Continuous Infusions:  cefTRIAXone  (ROCEPHIN )  IV 1 g (12/15/24 2022)   Diet: Diet Order             DIET DYS 3 Room service appropriate? Yes; Fluid consistency: Thin  Diet effective now                  Data Reviewed: I have personally reviewed following labs and imaging studies ( see epic result tab) CBC: Recent Labs  Lab 12/12/24 1608 12/12/24 2130 12/13/24 0520 12/14/24 0422 12/15/24 0425 12/16/24 0358  WBC 7.4 6.5 5.7 11.0* 6.5 4.8  NEUTROABS 4.5  --   --   --   --   --   HGB 10.2* 9.2* 8.7* 9.1* 8.1* 8.2*  HCT 32.6* 29.2* 27.8* 28.7* 25.4* 25.8*  MCV 89.3 88.0 88.3 86.2 85.8 85.7  PLT 231 220 210 213 186 189   CMP: Recent Labs  Lab 12/12/24 1608 12/12/24 2130 12/13/24 0520 12/14/24 0422 12/15/24 0425 12/16/24 0358  NA 138  --  136 138 138 138  K 4.5  --  5.0 4.8 4.7 4.5  CL 101  --  99 101 106 105  CO2 25  --  22 23 21* 22  GLUCOSE 316*  --  295* 96 102* 111*   BUN 36*  --  44* 57* 62* 46*  CREATININE 1.92* 2.12* 2.43* 2.65* 2.21* 1.92*  CALCIUM  8.8*  --  8.8* 8.5* 8.0* 8.3*  MG  --   --   --  2.7* 2.6* 2.6*   GFR: Estimated Creatinine Clearance: 13.1 mL/min (A) (by C-G formula based on SCr of 1.92 mg/dL (H)). Recent Labs  Lab 12/13/24 0520  AST 25  ALT 17  ALKPHOS 116  BILITOT 0.4  PROT 6.6  ALBUMIN 3.7   No results for input(s): LIPASE, AMYLASE in the last 168 hours. No results for input(s): AMMONIA in the last 168 hours.  Coagulation Profile: No results for input(s): INR, PROTIME in the last 168 hours. Unresulted Labs (From admission, onward)     Start     Ordered   12/17/24 0500  Basic metabolic panel with GFR  Tomorrow morning,   R        12/16/24 0903   12/17/24 0500  CBC  Tomorrow morning,   R        12/16/24 0903   12/15/24 0500  Magnesium   Daily,   R     Question:  Specimen collection method  Answer:  Lab=Lab collect   12/14/24 1723           Antimicrobials/Microbiology: Anti-infectives (From admission, onward)    Start     Dose/Rate Route Frequency Ordered Stop   12/13/24 2000  cefTRIAXone  (ROCEPHIN ) 1 g in sodium chloride  0.9 % 100 mL IVPB        1 g 200 mL/hr over 30 Minutes Intravenous Every 24 hours 12/12/24 2001 12/17/24 1959   12/13/24 1000  azithromycin  (ZITHROMAX ) 500 mg in sodium chloride  0.9 % 250 mL IVPB  Status:  Discontinued        500 mg 250 mL/hr over 60 Minutes Intravenous Every 24 hours 12/12/24 2001 12/12/24 2002   12/13/24 1000  azithromycin  (ZITHROMAX ) tablet 500 mg  Status:  Discontinued        500 mg Oral Daily 12/12/24 2002 12/15/24 0823   12/13/24 0330  oseltamivir  (TAMIFLU ) capsule 30 mg        30 mg Oral Daily 12/13/24 0314 12/18/24 0959   12/12/24 1900  cefTRIAXone  (ROCEPHIN ) 1 g in sodium chloride  0.9 % 100 mL IVPB        1 g 200 mL/hr over 30 Minutes Intravenous  Once 12/12/24 1855 12/12/24 2001   12/12/24 1900  azithromycin  (ZITHROMAX ) tablet 500 mg        500 mg Oral  Once  12/12/24 1855 12/12/24 1928         Component Value Date/Time   SDES URINE, CATHETERIZED 11/03/2017 1536   SPECREQUEST NONE 11/03/2017 1536   CULT (A) 11/03/2017 1536    <10,000 COLONIES/mL INSIGNIFICANT GROWTH Performed at [facility], [address], [place], [place] [address]    REPTSTATUS 11/04/2017 FINAL 11/03/2017 1536    Procedures:    Mennie LAMY, MD [facility] 12/16/2024, 11:55 AM   "

## 2024-12-16 NOTE — Hospital Course (Addendum)
 Janet Steele is a 89 y.o. female with PMH of CAD status post CABG, symptomatic bradycardia status post pacemaker placement, chronic HFrEF, dementia, diabetes mellitus type 2, chronic kidney disease stage III vs 4, chronic anemia, hypothyroidism was brought to the ER after patient was found to be short of breath and tachypneic at the urgent care. In the ED patient was hypoxic needing 4 L oxygen,CT chest shows >concerning for left upper lobe and lingular pneumonia. Influenza test positive.  Patient's proBNP is 5600 troponins are flat at 21 and 20.  Patient also had features of possible fluid overload was given one dose of Lasix   Patient was admitted for acute hypoxic respiratory failure, pneumonia, influenza A infection, CHF exacerbation. At this time she has clinically stabilized and improved, planning for discharge to skilled nursing facility before returning to ALF  Subjective: Seen and examined  Resting comfortably able to tell me her name no complaint Overnight patient has been afebrile, vitals fairly stable blood sugar 102 Labs reviewed from 1/12  Discharge Diagnoses:   Acute respiratory failure with hypoxia Left upper lobe and lingular pneumonia Influenza A infection: Patient presented with shortness of air tachypnea hypoxia workup with hypoxia secondary to flu A and pneumonia Patient received azithromycin  x 3 days, ceftriaxone  x 5 days Tamiflu  EOT 1/12. Cont OOB PT OT IS, NEBS. BREO  Acute on chronic HFrEF: Last EF 40% and G1 DD in March 24, got 1 dose of Lasix  with improvement, given elevated creatinine holding further Lasix , CKD appears to be at baseline.  Not on diuretics at home.  Continue Toprol . Net IO Since Admission: 351.3 mL [12/18/24 1232]   Hypothyroidism: TSH is now 4.41 ,Continue home Synthroid .  Being followed by PCP to adjust dose.  HLD Continue statin  Hiatal hernia: Continue PPI  T2 DM: PTA glimepiride , blood sugar well-controlled continue SSI  CAD S/P  cabg Pacemaker-Medtronic Sick sinus syndrome: Continue patient's aspirin  Plavix  statin  CKD stage IV: creatinine improving 2.2> 1.8 her baseline  ~ 2-2.3 in June 25.   Dementia with behavioral disturbance Depression: Continue home Zyprexa , Cymbalta , Aricept   Anemia of chronic renal disease  Stable hb.  Transfuse if less than 7  Lung nodules: Numerous scattered diffuse pulmonary nodules 4 m or less will need follow-up in short interval from PCP if interested  Deconditioning/debility PT Orders: Active PT Follow up Rec: Skilled Nursing-Short Term Rehab (<3 Hours/Day)12/17/2024 1548   DVT prophylaxis: Place and maintain sequential compression device Start: 12/16/24 0903Heparin held due to bruising, add SCD Code Status:   Code Status: Limited: Do not attempt resuscitation (DNR) -DNR-LIMITED -Do Not Intubate/DNI  Family Communication: plan of care discussed with patient at bedside. Patient status is: Remains hospitalized because of severity of illness Level of care: Telemetry   Dispo: The patient is from: ALF            Anticipated disposition: SNF.   Objective: Vitals last 24 hrs: Vitals:   12/17/24 1422 12/17/24 2029 12/18/24 0542 12/18/24 0803  BP: 101/61 (!) 116/46 135/64   Pulse: 66 (!) 59 (!) 59   Resp: 18 15 15    Temp: 98.4 F (36.9 C) 98.1 F (36.7 C) 97.8 F (36.6 C)   TempSrc: Oral Oral Oral   SpO2: 94% 95% 93% 94%  Weight:      Height:       Physical Examination: General exam: alert awake HEENT:Oral mucosa moist, Ear/Nose WNL grossly Respiratory system: mild wheezing on left lung Cardiovascular system: S1 & S2 +, No  JVD. Gastrointestinal system: Abdomen soft,NT,ND, BS+ Nervous System:moving all extremities,and following commands. Extremities: extremities warm, leg edema negative Skin: Warm, no rashes MSK: weak

## 2024-12-17 DIAGNOSIS — J9601 Acute respiratory failure with hypoxia: Secondary | ICD-10-CM | POA: Diagnosis not present

## 2024-12-17 LAB — CBC
HCT: 27.4 % — ABNORMAL LOW (ref 36.0–46.0)
Hemoglobin: 8.8 g/dL — ABNORMAL LOW (ref 12.0–15.0)
MCH: 27.7 pg (ref 26.0–34.0)
MCHC: 32.1 g/dL (ref 30.0–36.0)
MCV: 86.2 fL (ref 80.0–100.0)
Platelets: 209 K/uL (ref 150–400)
RBC: 3.18 MIL/uL — ABNORMAL LOW (ref 3.87–5.11)
RDW: 15.5 % (ref 11.5–15.5)
WBC: 5.3 K/uL (ref 4.0–10.5)
nRBC: 0 % (ref 0.0–0.2)

## 2024-12-17 LAB — BASIC METABOLIC PANEL WITH GFR
Anion gap: 10 (ref 5–15)
BUN: 40 mg/dL — ABNORMAL HIGH (ref 8–23)
CO2: 23 mmol/L (ref 22–32)
Calcium: 8.7 mg/dL — ABNORMAL LOW (ref 8.9–10.3)
Chloride: 104 mmol/L (ref 98–111)
Creatinine, Ser: 1.88 mg/dL — ABNORMAL HIGH (ref 0.44–1.00)
GFR, Estimated: 25 mL/min — ABNORMAL LOW
Glucose, Bld: 113 mg/dL — ABNORMAL HIGH (ref 70–99)
Potassium: 4.6 mmol/L (ref 3.5–5.1)
Sodium: 138 mmol/L (ref 135–145)

## 2024-12-17 LAB — GLUCOSE, CAPILLARY
Glucose-Capillary: 110 mg/dL — ABNORMAL HIGH (ref 70–99)
Glucose-Capillary: 127 mg/dL — ABNORMAL HIGH (ref 70–99)
Glucose-Capillary: 201 mg/dL — ABNORMAL HIGH (ref 70–99)
Glucose-Capillary: 169 mg/dL — ABNORMAL HIGH (ref 70–99)

## 2024-12-17 LAB — MAGNESIUM: Magnesium: 2.6 mg/dL — ABNORMAL HIGH (ref 1.7–2.4)

## 2024-12-17 MED ORDER — PANTOPRAZOLE SODIUM 40 MG PO TBEC
40.0000 mg | DELAYED_RELEASE_TABLET | Freq: Every day | ORAL | Status: DC
  Administered 2024-12-18: 40 mg via ORAL
  Filled 2024-12-17: qty 1

## 2024-12-17 NOTE — Progress Notes (Signed)
 " PROGRESS NOTE Janet Steele  FMW:[id number] DOB: 01-May-1929 DOA: 12/12/2024 PCP: Geofm Glade PARAS, MD  Brief Narrative/Hospital Course: Janet Steele is a 89 y.o. female with PMH of CAD status post CABG, symptomatic bradycardia status post pacemaker placement, chronic HFrEF, dementia, diabetes mellitus type 2, chronic kidney disease stage III vs 4, chronic anemia, hypothyroidism was brought to the ER after patient was found to be short of breath and tachypneic at the urgent care. In the ED patient was hypoxic needing 4 L oxygen,CT chest shows >concerning for left upper lobe and lingular pneumonia. Influenza test positive.  Patient's proBNP is 5600 troponins are flat at 21 and 20.  Patient also had features of possible fluid overload was given one dose of Lasix   Patient was admitted for acute hypoxic respiratory failure, pneumonia, influenza A infection, CHF exacerbation.  Subjective: Seen and examined  Complains of some chest soreness, cough Overnight afebrile, VSS,  on RA  Labs creatinine improving 2.2> 1.9> 1.8  Baseline ~ 2-2.3 in June 25 , Hb about the same 8.2.  Sleepy this morning and ha not been up  Assessment and plan:  Acute respiratory failure with hypoxia Left upper lobe and lingular pneumonia Influenza A infection: Patient presented with shortness of air tachypnea hypoxia workup with hypoxia secondary to flu A and pneumonia Patient received azithromycin  x 3 days, continue ceftriaxone  to complete 5 days course.   Continue Tamiflu  EOT 1/12. Cont OOB PT OT IS, NEBS. BREO  Acute on chronic HFrEF: Last EF 40% and G1 DD in March 24, got 1 dose of Lasix  with improvement, given elevated creatinine holding further Lasix , CKD appears to be at baseline.  Not on diuretics at home.  Continue Toprol . Net IO Since Admission: 551.3 mL [12/17/24 1300]   Hypothyroidism: TSH is now 4.41 ,Continue home Synthroid .  Being followed by PCP to adjust dose.  HLD Continue statin  Hiatal  hernia: Continue PPI  T2 DM: PTA glimepiride , blood sugar well-controlled continue SSI  CAD S/P cabg Pacemaker-Medtronic Sick sinus syndrome: Continue patient's aspirin  Plavix  statin  CKD stage IV: creatinine improving 2.2> 1.9 B/L ~ 2-2.3 in June 25.  Monitor labs Recent Labs    04/27/24 1603 05/16/24 1455 12/12/24 1608 12/12/24 2130 12/13/24 0520 12/14/24 0422 12/15/24 0425 12/16/24 0358 12/17/24 0348  BUN 25* 26* 36*  --  44* 57* 62* 46* 40*  CREATININE 2.30* 2.16* 1.92* 2.12* 2.43* 2.65* 2.21* 1.92* 1.88*  CO2 28 25 25   --  22 23 21* 22 23  K 4.6 4.1 4.5  --  5.0 4.8 4.7 4.5 4.6    Dementia with behavioral disturbance Depression: Continue home Zyprexa , Cymbalta , Aricept   Anemia of chronic renal disease  Stable hb.  Transfuse if less than 7  Lung nodules: Numerous scattered diffuse pulmonary nodules 4 m or less will need follow-up in short interval from PCP if interested  Deconditioning/debility PT Orders: Active PT Follow up Rec: Skilled Nursing-Short Term Rehab (<3 Hours/Day)12/15/2024 1513   DVT prophylaxis: Place and maintain sequential compression device Start: 12/16/24 0903Heparin held due to bruising, add SCD Code Status:   Code Status: Limited: Do not attempt resuscitation (DNR) -DNR-LIMITED -Do Not Intubate/DNI  Family Communication: plan of care discussed with patient at bedside. Patient status is: Remains hospitalized because of severity of illness Level of care: Telemetry   Dispo: The patient is from: ALF            Anticipated disposition: pending SNF. Unable to return to ALG due to  her weakness. Discussed w/ TOC  Objective: Vitals last 24 hrs: Vitals:   12/17/24 0417 12/17/24 1055 12/17/24 1133 12/17/24 1207  BP: (!) 126/43     Pulse: (!) 59  62   Resp: 16 19  19   Temp: 97.8 F (36.6 C)     TempSrc: Oral     SpO2: 94%     Weight:      Height:       Physical Examination: General exam: alert awake HEENT:Oral mucosa moist, Ear/Nose WNL  grossly Respiratory system: mild wheezing on left lung Cardiovascular system: S1 & S2 +, No JVD. Gastrointestinal system: Abdomen soft,NT,ND, BS+ Nervous System:moving all extremities,and following commands. Extremities: extremities warm, leg edema negative Skin: Warm, no rashes MSK: weak  Medications reviewed:  Scheduled Meds:  aspirin   81 mg Oral Daily   atorvastatin   80 mg Oral Daily   clopidogrel   75 mg Oral Daily   donepezil   5 mg Oral QHS   DULoxetine   60 mg Oral Daily   fluticasone  furoate-vilanterol  1 puff Inhalation Daily   insulin  aspart  0-5 Units Subcutaneous QHS   insulin  aspart  0-6 Units Subcutaneous TID WC   levothyroxine   112 mcg Oral Q0600   metoprolol  tartrate  25 mg Oral BID   OLANZapine   5 mg Oral Daily   pantoprazole  (PROTONIX ) IV  40 mg Intravenous Q24H  Continuous Infusions:  Diet: Diet Order             DIET DYS 3 Room service appropriate? Yes; Fluid consistency: Thin  Diet effective now                  Data Reviewed: I have personally reviewed following labs and imaging studies ( see epic result tab) CBC: Recent Labs  Lab 12/12/24 1608 12/12/24 2130 12/13/24 0520 12/14/24 0422 12/15/24 0425 12/16/24 0358 12/17/24 0348  WBC 7.4   < > 5.7 11.0* 6.5 4.8 5.3  NEUTROABS 4.5  --   --   --   --   --   --   HGB 10.2*   < > 8.7* 9.1* 8.1* 8.2* 8.8*  HCT 32.6*   < > 27.8* 28.7* 25.4* 25.8* 27.4*  MCV 89.3   < > 88.3 86.2 85.8 85.7 86.2  PLT 231   < > 210 213 186 189 209   < > = values in this interval not displayed.   CMP: Recent Labs  Lab 12/13/24 0520 12/14/24 0422 12/15/24 0425 12/16/24 0358 12/17/24 0348  NA 136 138 138 138 138  K 5.0 4.8 4.7 4.5 4.6  CL 99 101 106 105 104  CO2 22 23 21* 22 23  GLUCOSE 295* 96 102* 111* 113*  BUN 44* 57* 62* 46* 40*  CREATININE 2.43* 2.65* 2.21* 1.92* 1.88*  CALCIUM  8.8* 8.5* 8.0* 8.3* 8.7*  MG  --  2.7* 2.6* 2.6* 2.6*   GFR: Estimated Creatinine Clearance: 13.4 mL/min (A) (by C-G formula  based on SCr of 1.88 mg/dL (H)). Recent Labs  Lab 12/13/24 0520  AST 25  ALT 17  ALKPHOS 116  BILITOT 0.4  PROT 6.6  ALBUMIN 3.7   No results for input(s): LIPASE, AMYLASE in the last 168 hours. No results for input(s): AMMONIA in the last 168 hours. Coagulation Profile: No results for input(s): INR, PROTIME in the last 168 hours. Unresulted Labs (From admission, onward)    None      Antimicrobials/Microbiology: Anti-infectives (From admission, onward)    Start  Dose/Rate Route Frequency Ordered Stop   12/13/24 2000  cefTRIAXone  (ROCEPHIN ) 1 g in sodium chloride  0.9 % 100 mL IVPB        1 g 200 mL/hr over 30 Minutes Intravenous Every 24 hours 12/12/24 2001 12/16/24 2003   12/13/24 1000  azithromycin  (ZITHROMAX ) 500 mg in sodium chloride  0.9 % 250 mL IVPB  Status:  Discontinued        500 mg 250 mL/hr over 60 Minutes Intravenous Every 24 hours 12/12/24 2001 12/12/24 2002   12/13/24 1000  azithromycin  (ZITHROMAX ) tablet 500 mg  Status:  Discontinued        500 mg Oral Daily 12/12/24 2002 12/15/24 0823   12/13/24 0330  oseltamivir  (TAMIFLU ) capsule 30 mg        30 mg Oral Daily 12/13/24 0314 12/17/24 1134   12/12/24 1900  cefTRIAXone  (ROCEPHIN ) 1 g in sodium chloride  0.9 % 100 mL IVPB        1 g 200 mL/hr over 30 Minutes Intravenous  Once 12/12/24 1855 12/12/24 2001   12/12/24 1900  azithromycin  (ZITHROMAX ) tablet 500 mg        500 mg Oral  Once 12/12/24 1855 12/12/24 1928         Component Value Date/Time   SDES URINE, CATHETERIZED 11/03/2017 1536   SPECREQUEST NONE 11/03/2017 1536   CULT (A) 11/03/2017 1536    <10,000 COLONIES/mL INSIGNIFICANT GROWTH Performed at [facility], [address], [place], [place] [address]    REPTSTATUS 11/04/2017 FINAL 11/03/2017 1536    Procedures:    Mennie LAMY, MD [facility] 12/17/2024, 1:00 PM   "

## 2024-12-17 NOTE — Plan of Care (Signed)
   Problem: Coping: Goal: Level of anxiety will decrease Outcome: Progressing   Problem: Safety: Goal: Ability to remain free from injury will improve Outcome: Progressing   Problem: Skin Integrity: Goal: Risk for impaired skin integrity will decrease Outcome: Progressing

## 2024-12-17 NOTE — TOC Progression Note (Addendum)
 Transition of Care [facility]) - Progression Note    Patient Details  Name: Janet Steele MRN: [id number] Date of Birth: 07-19-1929  [facility] CM/SW Contact  Tawni CHRISTELLA Eva, LCSW Phone Number: 12/17/2024, 9:19 AM  Clinical Narrative:     CSW attempted to call Melissa with [facility], no answer left HIPAA complaint VM requesting a return cal. ICM to follow.    ADDEN 10:50am CSW spoke with Summer at [facility]. They are not able to accept the pt back at the current level of care and would like the pt to go to rehab before she can return. Pt's clinicals faxed over to [facility].   CSW attempted to contact the pts son; there was no answer, and a voicemail was left requesting a return call. ICM to follow.   12:08pm CSW received message from pt's RN, pt's son in the room asking about SNF placement. CSW attempted  to meet pt's son in room but he left. CSW attempted to call pt's son again no answer left VM requesting return call.   Pt's RN stated pt's son was ok with faxing pt's information out for rehab. CSW spoke with pt's son Jerel, he is agreeable to SNF and would like [facility]. CSW explained the process. pt's son provided verbal understanding. ICM to follow.   Expected Discharge Plan: Assisted Living ([facility]) Barriers to Discharge: Continued Medical Work up               Expected Discharge Plan and Services                                               Social Drivers of Health (SDOH) Interventions SDOH Screenings   Food Insecurity: Low Risk (12/14/2024)   Received from [facility]  Housing: Low Risk (12/14/2024)   Received from [facility]  Transportation Needs: No Transportation Needs (12/14/2024)   Received from [facility]  Utilities: Low Risk (12/14/2024)   Received from [facility]  Alcohol Screen: Low Risk (05/16/2024)  Depression (PHQ2-9): Medium Risk (05/16/2024)  Financial Resource Strain: Low Risk (05/16/2024)   Physical Activity: Inactive (05/16/2024)  Social Connections: Moderately Isolated (12/13/2024)  Stress: No Stress Concern Present (05/16/2024)  Tobacco Use: Medium Risk (12/12/2024)  Health Literacy: Inadequate Health Literacy (05/16/2024)    Readmission Risk Interventions     No data to display

## 2024-12-17 NOTE — [place] FL2 (Signed)
 " [place]  MEDICAID FL2 LEVEL OF CARE FORM     IDENTIFICATION  Patient Name: Janet Steele Birthdate: [birth year] Sex: female Admission Date (Current Location): 12/12/2024  [facility] and [place] Number:  Producer, Television/film/video and Address:  [facility],  [address]. [address], [place] [address]      Provider Number: [id number]  Attending Physician Name and Address:  Christobal Guadalajara, MD  Relative Name and Phone Number:  Dashawna, Delbridge.  Son, Emergency Contact  [phone] (Mobile)    Current Level of Care: Hospital Recommended Level of Care: Skilled Nursing Facility Prior Approval Number:    Date Approved/Denied:   PASRR Number: [id number]  Discharge Plan: SNF    Current Diagnoses: Patient Active Problem List   Diagnosis Date Noted   Acute on chronic HFrEF (heart failure with reduced ejection fraction) (HCC) 12/13/2024   CAP (community acquired pneumonia) 12/13/2024   Influenza A with pneumonia 12/13/2024   Lung nodules 12/13/2024   Acute respiratory failure with hypoxia (HCC) 12/12/2024   Low vitamin B12 level 02/21/2023   History of esophageal stricture 01/06/2022   Aortic atherosclerosis 11/03/2020   Chronic kidney disease, stage 4 (severe) (HCC) 11/02/2020   Urinary frequency 10/17/2020   Poor balance 04/29/2020   Physical deconditioning 04/29/2020   Cold extremities 02/19/2020   Colitis 01/15/2020   Cervicogenic headache 07/21/2019   LLQ pain 02/05/2019   Other headache syndrome 08/14/2018   Depression 08/14/2018   History of cerebrovascular accident (CVA) with residual deficit 11/03/2017   Chronic back pain 07/04/2017   PAD (peripheral artery disease) 01/17/2017   Overactive bladder 12/17/2016   Anemia 05/20/2016   Coronary artery disease involving coronary bypass graft of native heart without angina pectoris 05/19/2016   Dementia with behavioral disturbance (HCC) 11/18/2015   Facial tic 05/07/2015   Sick sinus syndrome (HCC) 02/26/2015   Cervical  dystonia 12/04/2014   Pacemaker-Medtronic 07/07/2012   GERD (gastroesophageal reflux disease) 07/05/2012   Cough 08/04/2010   Abnormal involuntary movement 12/17/2009   Sleep difficulties 12/11/2009   CAD, ARTERY BYPASS GRAFT 09/24/2009   CAROTID BRUIT 09/24/2009   Diabetes mellitus with diabetic neuropathy (HCC) 01/30/2009   Dysphagia 06/19/2008   Anxiety 02/14/2008   EROSIVE ESOPHAGITIS 02/14/2008   CONSTIPATION, CHRONIC 02/14/2008   DEGENERATIVE JOINT DISEASE 02/14/2008   HEARING LOSS 01/04/2008   PVD 01/04/2008   Hypothyroidism 09/25/2007   Hyperlipidemia associated with type 2 diabetes mellitus (HCC) 09/25/2007   Essential hypertension 09/07/2007   Diabetic polyneuropathy (HCC) 03/16/2007   Stricture and stenosis of esophagus 03/16/2007   HIATAL HERNIA 02/16/2007   Hemorrhoid 12/31/1997   DIVERTICULOSIS, COLON 12/31/1997    Orientation RESPIRATION BLADDER Height & Weight     Self  Normal Incontinent Weight: 114 lb 6.7 oz (51.9 kg) Height:  5' (152.4 cm)  BEHAVIORAL SYMPTOMS/MOOD NEUROLOGICAL BOWEL NUTRITION STATUS      Continent Diet (DSY3)  AMBULATORY STATUS COMMUNICATION OF NEEDS Skin   Extensive Assist Verbally Other (Comment) (see d/c summary)                       Personal Care Assistance Level of Assistance  Bathing, Feeding, Dressing Bathing Assistance: Limited assistance Feeding assistance: Independent Dressing Assistance: Limited assistance     Functional Limitations Info  Sight, Hearing, Speech Sight Info: Adequate Hearing Info: Adequate Speech Info: Adequate    SPECIAL CARE FACTORS FREQUENCY  PT (By licensed PT), OT (By licensed OT)     PT Frequency: 5 x a week  OT Frequency: 5 x a week            Contractures Contractures Info: Not present    Additional Factors Info  Code Status, Allergies Code Status Info: DNR Allergies Info: Morphine  Penicillins  Prednisone   Sulfonamide Derivatives  Oxycodone   Codeine  Hydrocodone -acetaminophen    Meperidine Hcl  Metoclopramide Hcl  Metoprolol  Succinate  Moxifloxacin  Oxycodone -aspirin   Pentazocine Lactate  Pioglitazone  Trazodone  And Nefazodone           Current Medications (12/17/2024):  This is the current hospital active medication list Current Facility-Administered Medications  Medication Dose Route Frequency Provider Last Rate Last Admin   acetaminophen  (TYLENOL ) tablet 650 mg  650 mg Oral Q6H PRN Franky Redia SAILOR, MD   650 mg at 12/17/24 1134   Or   acetaminophen  (TYLENOL ) suppository 650 mg  650 mg Rectal Q6H PRN Franky Redia SAILOR, MD       aspirin  chewable tablet 81 mg  81 mg Oral Daily Kakrakandy, Arshad N, MD   81 mg at 12/17/24 1134   atorvastatin  (LIPITOR ) tablet 80 mg  80 mg Oral Daily Kakrakandy, Arshad N, MD   80 mg at 12/17/24 1133   clopidogrel  (PLAVIX ) tablet 75 mg  75 mg Oral Daily Franky Redia SAILOR, MD   75 mg at 12/17/24 1134   donepezil  (ARICEPT ) tablet 5 mg  5 mg Oral QHS Mdala-Gausi, Masiku Agatha, MD   5 mg at 12/16/24 2123   DULoxetine  (CYMBALTA ) DR capsule 60 mg  60 mg Oral Daily Mdala-Gausi, Masiku Agatha, MD   60 mg at 12/17/24 1133   fluticasone  furoate-vilanterol (BREO ELLIPTA ) 100-25 MCG/ACT 1 puff  1 puff Inhalation Daily Mdala-Gausi, Masiku Agatha, MD   1 puff at 12/17/24 0827   insulin  aspart (novoLOG ) injection 0-5 Units  0-5 Units Subcutaneous QHS Mdala-Gausi, Masiku Agatha, MD       insulin  aspart (novoLOG ) injection 0-6 Units  0-6 Units Subcutaneous TID WC Mdala-Gausi, Masiku Agatha, MD   1 Units at 12/16/24 1254   ipratropium-albuterol  (DUONEB) 0.5-2.5 (3) MG/3ML nebulizer solution 3 mL  3 mL Nebulization Q2H PRN Franky Redia SAILOR, MD       levothyroxine  (SYNTHROID ) tablet 112 mcg  112 mcg Oral Q0600 Mdala-Gausi, Masiku Agatha, MD   112 mcg at 12/17/24 9372   metoprolol  tartrate (LOPRESSOR ) tablet 25 mg  25 mg Oral BID Franky Redia SAILOR, MD   25 mg at 12/17/24 1133   OLANZapine  (ZYPREXA ) tablet 5 mg  5 mg Oral Daily Mdala-Gausi,  Masiku Agatha, MD   5 mg at 12/16/24 1530   pantoprazole  (PROTONIX ) injection 40 mg  40 mg Intravenous Q24H Mdala-Gausi, Masiku Agatha, MD   40 mg at 12/17/24 1130     Discharge Medications: Please see discharge summary for a list of discharge medications.  Relevant Imaging Results:  Relevant Lab Results:   Additional Information [id number]  Tawni HERO Yao [place], LCSW     "

## 2024-12-17 NOTE — Progress Notes (Signed)
 Physical Therapy Treatment Patient Details Name: Janet Steele MRN: [id number] DOB: Mar 29, 1929 Today's Date: 12/17/2024   History of Present Illness Janet Steele is a 89 y.o. female was brought to the ER after patient was found to be short of breath and tachypneic at the urgent care.The patient has been diagnosed with acute hypoxic respiratory failure, pneumonia, influenza A infection, CHF exacerbation.   PMH:CAD status post CABG, symptomatic bradycardia status post pacemaker placement, chronic HFrEF, dementia, diabetes mellitus type 2, chronic kidney disease stage III, chronic anemia, hypothyroidism    PT Comments  Pt agreeable to therapy, reporting fear of falling, requesting assist but responds well to encouragement, cues and education. Pt needing mod A with bedpad to mobilize to bedside, cues for hand placement on bedrails, elevating HOB to assist. Pt completes initial STS with RW, max A to power up, therapist blocking bil feet from sliding, pt reporting fear of falling so return to sitting. Pt performs 5 additional STS reps with therapist anterior to pt, max-mod A to power up, slow, therapist blocking bil feet from sliding. Pt's son then arrives, offering strong encouragement. Pt performs 2 additional STS reps with therapist anterior to pt, then on 2nd able to take 2-3 shuffling steps to recliner at bedside, strong posterior lean with pt cuing forward weight shift, max A for step pivot. Pt performs 2 additional sit to stand reps in recliner, mod-max A to power up, cues for foot positioning and muscle activation to power up for pillow and linen repositioning. Pt on RA throughout session with Spo2 >92%. Son at bedside reports pt was walking at [place], sometimes used RW and sometimes didn't. Continue to recommend inpatient follow up therapy, <3 hours/day .   If plan is discharge home, recommend the following: A lot of help with walking and/or transfers;A lot of help with  bathing/dressing/bathroom;Assistance with cooking/housework;Assist for transportation;Help with stairs or ramp for entrance;Supervision due to cognitive status   Can travel by private vehicle     No  Equipment Recommendations  None recommended by PT    Recommendations for Other Services       Precautions / Restrictions Precautions Precautions: Fall Recall of Precautions/Restrictions: Impaired Precaution/Restrictions Comments: incontinent Restrictions Weight Bearing Restrictions Per Provider Order: No     Mobility  Bed Mobility Overal bed mobility: Needs Assistance Bed Mobility: Supine to Sit     Supine to sit: Mod assist, HOB elevated, Used rails     General bed mobility comments: pt using bedrail and elevated HOB, mod A with bedpad to mobilize to bedside, cues for independence and initiation    Transfers Overall transfer level: Needs assistance Equipment used: Rolling walker (2 wheels), 1 person hand held assist Transfers: Sit to/from Stand Sit to Stand: Max assist, Mod assist   Step pivot transfers: Max assist       General transfer comment: max-mod A for STS reps from bedside, therapist blocking BLE from sliding, attempted initial with but pt with fear of falling so utilized therapist anterior to pt instead, increased time and cues wtih assist to shift weight forward for step pivot to drop arm recliner at bedside    Ambulation/Gait                   Stairs             Wheelchair Mobility     Tilt Bed    Modified Rankin (Stroke Patients Only)       Balance  Communication Communication Communication: Impaired Factors Affecting Communication: Hearing impaired  Cognition Arousal: Alert Behavior During Therapy: WFL for tasks assessed/performed   PT - Cognitive impairments: No family/caregiver present to determine baseline                       PT - Cognition  Comments: pt states name, hospital as location, correctly identifies city, unable to state current date, correctly identifies current president Following commands: Intact Following commands impaired: Only follows one step commands consistently (repeat cues, suspect HOH due to promptly following when repeated)    Cueing    Exercises      General Comments        Pertinent Vitals/Pain Pain Assessment Pain Assessment: No/denies pain    Home Living                          Prior Function            PT Goals (current goals can now be found in the care plan section) Progress towards PT goals: Progressing toward goals    Frequency    Min 2X/week      PT Plan      Co-evaluation              AM-PAC PT 6 Clicks Mobility   Outcome Measure  Help needed turning from your back to your side while in a flat bed without using bedrails?: A Lot Help needed moving from lying on your back to sitting on the side of a flat bed without using bedrails?: A Lot Help needed moving to and from a bed to a chair (including a wheelchair)?: A Lot Help needed standing up from a chair using your arms (e.g., wheelchair or bedside chair)?: A Lot Help needed to walk in hospital room?: Total Help needed climbing 3-5 steps with a railing? : Total 6 Click Score: 10    End of Session Equipment Utilized During Treatment: Gait belt Activity Tolerance: Patient tolerated treatment well Patient left: in chair;with call bell/phone within reach;with chair alarm set;with family/visitor present;with nursing/sitter in room Nurse Communication: Mobility status PT Visit Diagnosis: Unsteadiness on feet (R26.81);Muscle weakness (generalized) (M62.81)     Time: 8495-8463 PT Time Calculation (min) (ACUTE ONLY): 32 min  Charges:    $Therapeutic Activity: 23-37 mins PT General Charges $$ ACUTE PT VISIT: 1 Visit                     Janet Steele PT, DPT 12/17/2024, 3:53 PM

## 2024-12-17 NOTE — Progress Notes (Signed)
 VAST consult. Per [employer]. Patient is currently up to the chair. No medications due until tomorrow. RN will consult when pt is back in bed as needed. Powell Bowler, RN VAST

## 2024-12-18 DIAGNOSIS — J9601 Acute respiratory failure with hypoxia: Secondary | ICD-10-CM | POA: Diagnosis not present

## 2024-12-18 LAB — GLUCOSE, CAPILLARY
Glucose-Capillary: 102 mg/dL — ABNORMAL HIGH (ref 70–99)
Glucose-Capillary: 98 mg/dL (ref 70–99)

## 2024-12-18 NOTE — TOC Progression Note (Signed)
 Transition of Care [facility]) - Progression Note    Patient Details  Name: Janet Steele MRN: [id number] Date of Birth: 09/01/1929  [facility]) CM/SW Contact  Tawni CHRISTELLA Eva, LCSW Phone Number: 12/18/2024, 10:12 AM  Clinical Narrative:     CSW presented bed offers to pt's son. He is requesting time to review. ICM to follow.   Expected Discharge Plan: Assisted Living ([facility]) Barriers to Discharge: Continued Medical Work up               Expected Discharge Plan and Services                                               Social Drivers of Health (SDOH) Interventions SDOH Screenings   Food Insecurity: Low Risk (12/14/2024)   Received from [facility]  Housing: Low Risk (12/14/2024)   Received from [facility]  Transportation Needs: No Transportation Needs (12/14/2024)   Received from [facility]  Utilities: Low Risk (12/14/2024)   Received from [facility]  Alcohol Screen: Low Risk (05/16/2024)  Depression (PHQ2-9): Medium Risk (05/16/2024)  Financial Resource Strain: Low Risk (05/16/2024)  Physical Activity: Inactive (05/16/2024)  Social Connections: Moderately Isolated (12/13/2024)  Stress: No Stress Concern Present (05/16/2024)  Tobacco Use: Medium Risk (12/12/2024)  Health Literacy: Inadequate Health Literacy (05/16/2024)    Readmission Risk Interventions     No data to display

## 2024-12-18 NOTE — TOC Transition Note (Signed)
 Transition of Care [facility]) - Discharge Note   Patient Details  Name: Janet Steele MRN: [id number] Date of Birth: 1929-07-04  [facility]) CM/SW Contact:  Janet CHRISTELLA Eva, LCSW Phone Number: 12/18/2024, 12:32 PM   Clinical Narrative:     CSW spoke with pt's son Janet Steele he has chosen [facility]. CSW spoke with [facility] pt can be admitted today.   Pt's room 102P RN to call report [phone]. Pt's son Janet Steele agrees with d/c plan. [facility] called, no Further ICM needs,ICM sign off.       Final next level of care: Home w Home Health Services Barriers to Discharge: Barriers Resolved   Patient Goals and CMS Choice Patient states their goals for this hospitalization and ongoing recovery are:: SNF to get stronger CMS Medicare.gov Compare Post Acute Care list provided to:: Patient Represenative (must comment) Choice offered to / list presented to : Adult Children      Discharge Placement              Patient chooses bed at: [facility] Patient to be transferred to facility by: EMS Name of family member notified: Janet Steele,Janet Steele  Son, Emergency Contact  [phone] (Mobile) Patient and family notified of of transfer: 12/18/24  Discharge Plan and Services Additional resources added to the After Visit Summary for                                       Social Drivers of Health (SDOH) Interventions SDOH Screenings   Food Insecurity: Low Risk (12/14/2024)   Received from [facility]  Housing: Low Risk (12/14/2024)   Received from [facility]  Transportation Needs: No Transportation Needs (12/14/2024)   Received from [facility]  Utilities: Low Risk (12/14/2024)   Received from [facility]  Alcohol Screen: Low Risk (05/16/2024)  Depression (PHQ2-9): Medium Risk (05/16/2024)  Financial Resource Strain: Low Risk (05/16/2024)  Physical Activity: Inactive (05/16/2024)  Social Connections: Moderately Isolated (12/13/2024)  Stress: No Stress Concern Present  (05/16/2024)  Tobacco Use: Medium Risk (12/12/2024)  Health Literacy: Inadequate Health Literacy (05/16/2024)     Readmission Risk Interventions     No data to display

## 2024-12-18 NOTE — Plan of Care (Signed)
" °  Problem: Coping: Goal: Ability to adjust to condition or change in health will improve Outcome: Progressing   Problem: Clinical Measurements: Goal: Respiratory complications will improve Outcome: Progressing Goal: Cardiovascular complication will be avoided Outcome: Progressing   Problem: Nutrition: Goal: Adequate nutrition will be maintained Outcome: Progressing   Problem: Coping: Goal: Level of anxiety will decrease Outcome: Progressing   Problem: Pain Managment: Goal: General experience of comfort will improve and/or be controlled Outcome: Progressing   "

## 2024-12-18 NOTE — Progress Notes (Signed)
 Occupational Therapy Treatment Patient Details Name: Janet Steele MRN: [id number] DOB: [birth year] Today's Date: 12/18/2024   History of present illness Janet Steele is a 89 y.o. female was brought to the ER after patient was found to be short of breath and tachypneic at the urgent care.The patient has been diagnosed with acute hypoxic respiratory failure, pneumonia, influenza A infection, CHF exacerbation.   PMH:CAD status post CABG, symptomatic bradycardia status post pacemaker placement, chronic HFrEF, dementia, diabetes mellitus type 2, chronic kidney disease stage III, chronic anemia, hypothyroidism   OT comments  Pt making gradual progress towards goals. Pt HOH and has a difficult time following commands. MAX A to perform bed mobility, fearful of falling with posterior push once seated upright at EOB. +2 called into room to utilize STEDY, bed elevated and pt able to perform STS with MAX A +2 with reassurance + encouragement. Requires setup to orient to lunch items setup by OT on breakfast tray, pt able to self-feed when provided fork. RN updated on pt's location and recommendation for STEDY to transfer back to bed. Discharge recommendation appropriate, OT will follow. Patient will benefit from continued inpatient follow up therapy, <3 hours/day       If plan is discharge home, recommend the following:  Two people to help with walking and/or transfers;A lot of help with bathing/dressing/bathroom   Equipment Recommendations  None recommended by OT       Precautions / Restrictions Precautions Precautions: Fall Recall of Precautions/Restrictions: Impaired Precaution/Restrictions Comments: incontinent Restrictions Weight Bearing Restrictions Per Provider Order: No       Mobility Bed Mobility Overal bed mobility: Needs Assistance Bed Mobility: Supine to Sit     Supine to sit: Max assist, HOB elevated     General bed mobility comments: MAX A with step by step cues to initiate  task, use of bed pad to shift hips forward. pt with posterior lean, fearful of falling    Transfers Overall transfer level: Needs assistance Equipment used: Ambulation equipment used Transfers: Sit to/from Stand, Bed to chair/wheelchair/BSC Sit to Stand: Max assist, +2 safety/equipment, From elevated surface, +2 physical assistance     Step pivot transfers: Max assist, +2 physical assistance, +2 safety/equipment, From elevated surface     General transfer comment: STEDY used with +2 present for safety. Pt requires bed elevated, cues for sequencing and therepeutic reassurance. pt able to rise with MAX A +2, posterior lean. Once transitioned to recliner, pt able to stand MOD A with 10 sec in static standing before fatiguing Transfer via Lift Equipment: Stedy   Balance Overall balance assessment: Needs assistance Sitting-balance support: Bilateral upper extremity supported, Feet supported Sitting balance-Leahy Scale: Fair Sitting balance - Comments: posterior lean   Standing balance support: Bilateral upper extremity supported, During functional activity, Reliant on assistive device for balance Standing balance-Leahy Scale: Poor Standing balance comment: external support required                           ADL either performed or assessed with clinical judgement   ADL Overall ADL's : Needs assistance/impaired Eating/Feeding: Set up;Sitting Eating/Feeding Details (indicate cue type and reason): cues to locate utensils, pt asking what is this and requires cues to orient to task, once handed fork, pt able to use appropriately Grooming: Set up;Sitting Grooming Details (indicate cue type and reason): sitting EOB                 Toilet Transfer: Maximal  assistance;+2 for physical assistance;+2 for safety/equipment;Cueing for safety;Cueing for sequencing;BSC/3in1 (STEDY) Toilet Transfer Details (indicate cue type and reason): simulated to recliner, +2 MAX A with STEDY. Pt  HOH limiting efficient command following.         Functional mobility during ADLs: Maximal assistance;+2 for physical assistance General ADL Comments: session focused on functional transfers with STS x3 in STEDY, MAX fading to MOD A +2     Communication Communication Communication: Impaired Factors Affecting Communication: Hearing impaired   Cognition Arousal: Alert Behavior During Therapy: WFL for tasks assessed/performed Cognition: No family/caregiver present to determine baseline, Cognition impaired   Orientation impairments: Place, Time, Situation Awareness: Intellectual awareness impaired, Online awareness impaired Memory impairment (select all impairments): Short-term memory, Working civil service fast streamer, Engineer, structural memory Attention impairment (select first level of impairment): Sustained attention Executive functioning impairment (select all impairments): Initiation, Organization, Sequencing, Reasoning, Problem solving                   Following commands: Impaired Following commands impaired: Only follows one step commands consistently (repeat cues, suspect HOH due to promptly following when repeated)      Cueing   Cueing Techniques: Tactile cues, Verbal cues             Pertinent Vitals/ Pain       Pain Assessment Pain Assessment: No/denies pain   Frequency  Min 2X/week        Progress Toward Goals  OT Goals(current goals can now be found in the care plan section)  Progress towards OT goals: Progressing toward goals  Acute Rehab OT Goals OT Goal Formulation: Patient unable to participate in goal setting Time For Goal Achievement: 12/30/24 Potential to Achieve Goals: Fair ADL Goals Pt Will Perform Lower Body Bathing: with mod assist;sit to/from stand Pt Will Transfer to Toilet: with mod assist;with +2 assist;bedside commode Additional ADL Goal #1: bed mobility with mod A in preparation for ADL tasks  Plan         AM-PAC OT 6 Clicks Daily  Activity     Outcome Measure   Help from another person eating meals?: A Little Help from another person taking care of personal grooming?: A Little Help from another person toileting, which includes using toliet, bedpan, or urinal?: Total Help from another person bathing (including washing, rinsing, drying)?: A Lot Help from another person to put on and taking off regular upper body clothing?: A Lot Help from another person to put on and taking off regular lower body clothing?: A Lot 6 Click Score: 13    End of Session Equipment Utilized During Treatment: Gait belt;Other (comment) (STEDY)  OT Visit Diagnosis: Unsteadiness on feet (R26.81);Other abnormalities of gait and mobility (R26.89);Muscle weakness (generalized) (M62.81);Other symptoms and signs involving cognitive function   Activity Tolerance Patient tolerated treatment well   Patient Left in chair;with call bell/phone within reach;with chair alarm set   Nurse Communication Mobility status;Need for lift equipment        Time: 1203-1227 OT Time Calculation (min): 24 min  Charges: OT General Charges $OT Visit: 1 Visit OT Treatments $Self Care/Home Management : 8-22 mins $Therapeutic Activity: 8-22 mins  Ltanya Bayley L. Agnes Probert, OTR/L  12/18/2024, 12:57 PM

## 2024-12-18 NOTE — Progress Notes (Signed)
 Patient will be discharged to [facility] with NAD. Report given to Sabrina,RN. Transported by [facility].

## 2024-12-18 NOTE — Discharge Summary (Signed)
 Physician Discharge Summary  Janet Steele FMW:[id number] DOB: Feb 15, 1929 DOA: 12/12/2024  PCP: Geofm Glade PARAS, MD  Admit date: 12/12/2024 Discharge date: 12/18/2024 Recommendations for Outpatient Follow-up:  Follow up with PCP in 1 weeks-call for appointment Please obtain BMP/CBC in one week  Discharge Dispo: SNF Discharge Condition: Stable Code Status:   Code Status: Limited: Do not attempt resuscitation (DNR) -DNR-LIMITED -Do Not Intubate/DNI  Diet recommendation:  Diet Order             DIET DYS 3 Room service appropriate? Yes; Fluid consistency: Thin  Diet effective now                    Brief/Interim Summary: Janet Steele is a 89 y.o. female with PMH of CAD status post CABG, symptomatic bradycardia status post pacemaker placement, chronic HFrEF, dementia, diabetes mellitus type 2, chronic kidney disease stage III vs 4, chronic anemia, hypothyroidism was brought to the ER after patient was found to be short of breath and tachypneic at the urgent care. In the ED patient was hypoxic needing 4 L oxygen,CT chest shows >concerning for left upper lobe and lingular pneumonia. Influenza test positive.  Patient's proBNP is 5600 troponins are flat at 21 and 20.  Patient also had features of possible fluid overload was given one dose of Lasix   Patient was admitted for acute hypoxic respiratory failure, pneumonia, influenza A infection, CHF exacerbation. At this time she has clinically stabilized and improved, planning for discharge to skilled nursing facility before returning to ALF  Subjective: Seen and examined  Resting comfortably able to tell me her name no complaint Overnight patient has been afebrile, vitals fairly stable blood sugar 102 Labs reviewed from 1/12  Discharge Diagnoses:   Acute respiratory failure with hypoxia Left upper lobe and lingular pneumonia Influenza A infection: Patient presented with shortness of air tachypnea hypoxia workup with hypoxia secondary to  flu A and pneumonia Patient received azithromycin  x 3 days, ceftriaxone  x 5 days Tamiflu  EOT 1/12. Cont OOB PT OT IS, NEBS. BREO  Acute on chronic HFrEF: Last EF 40% and G1 DD in March 24, got 1 dose of Lasix  with improvement, given elevated creatinine holding further Lasix , CKD appears to be at baseline.  Not on diuretics at home.  Continue Toprol . Net IO Since Admission: 351.3 mL [12/18/24 1232]   Hypothyroidism: TSH is now 4.41 ,Continue home Synthroid .  Being followed by PCP to adjust dose.  HLD Continue statin  Hiatal hernia: Continue PPI  T2 DM: PTA glimepiride , blood sugar well-controlled continue SSI  CAD S/P cabg Pacemaker-[employer] Sick sinus syndrome: Continue patient's aspirin  Plavix  statin  CKD stage IV: creatinine improving 2.2> 1.8 her baseline  ~ 2-2.3 in June 25.   Dementia with behavioral disturbance Depression: Continue home Zyprexa , Cymbalta , Aricept   Anemia of chronic renal disease  Stable hb.  Transfuse if less than 7  Lung nodules: Numerous scattered diffuse pulmonary nodules 4 m or less will need follow-up in short interval from PCP if interested  Deconditioning/debility PT Orders: Active PT Follow up Rec: Skilled Nursing-Short Term Rehab (<3 Hours/Day)12/17/2024 1548   DVT prophylaxis: Place and maintain sequential compression device Start: 12/16/24 0903Heparin held due to bruising, add SCD Code Status:   Code Status: Limited: Do not attempt resuscitation (DNR) -DNR-LIMITED -Do Not Intubate/DNI  Family Communication: plan of care discussed with patient at bedside. Patient status is: Remains hospitalized because of severity of illness Level of care: Telemetry   Dispo: The patient is from:  ALF            Anticipated disposition: SNF.   Objective: Vitals last 24 hrs: Vitals:   12/17/24 1422 12/17/24 2029 12/18/24 0542 12/18/24 0803  BP: 101/61 (!) 116/46 135/64   Pulse: 66 (!) 59 (!) 59   Resp: 18 15 15    Temp: 98.4 F (36.9 C) 98.1 F  (36.7 C) 97.8 F (36.6 C)   TempSrc: Oral Oral Oral   SpO2: 94% 95% 93% 94%  Weight:      Height:       Physical Examination: General exam: alert awake HEENT:Oral mucosa moist, Ear/Nose WNL grossly Respiratory system: mild wheezing on left lung Cardiovascular system: S1 & S2 +, No JVD. Gastrointestinal system: Abdomen soft,NT,ND, BS+ Nervous System:moving all extremities,and following commands. Extremities: extremities warm, leg edema negative Skin: Warm, no rashes MSK: weak     Consultation: See note.  Discharge Instructions  Discharge Instructions     Discharge instructions   Complete by: As directed    Please call call MD or return to ER for similar or worsening recurring problem that brought you to hospital or if any fever,nausea/vomiting,abdominal pain, uncontrolled pain, chest pain,  shortness of breath or any other alarming symptoms.  Please follow-up your doctor as instructed in a week time and call the office for appointment.  Please avoid alcohol, smoking, or any other illicit substance and maintain healthy habits including taking your regular medications as prescribed.  You were cared for by a hospitalist during your hospital stay. If you have any questions about your discharge medications or the care you received while you were in the hospital after you are discharged, you can call the unit and ask to speak with the hospitalist on call if the hospitalist that took care of you is not available.  Once you are discharged, your primary care physician will handle any further medical issues. Please note that NO REFILLS for any discharge medications will be authorized once you are discharged, as it is imperative that you return to your primary care physician (or establish a relationship with a primary care physician if you do not have one) for your aftercare needs so that they can reassess your need for medications and monitor your lab values   Increase activity slowly    Complete by: As directed    No wound care   Complete by: As directed       Allergies as of 12/18/2024       Reactions   Morphine Hives, Itching, Rash   Penicillins Itching, Rash   Has patient had a PCN reaction causing immediate rash, facial/tongue/throat swelling, SOB or lightheadedness with hypotension: Yes Has patient had a PCN reaction causing severe rash involving mucus membranes or skin necrosis: No Has patient had a PCN reaction that required hospitalization No Has patient had a PCN reaction occurring within the last 10 years: No If all of the above answers are NO, then may proceed with Cephalosporin use.   Prednisone  Other (See Comments)   MEMORY LOSS and  Allergic, per facility   Sulfonamide Derivatives Itching, Rash   Oxycodone  Other (See Comments)    Allergic, per facility   Codeine Rash   Hydrocodone -acetaminophen  Other (See Comments)   Allergies, per [facility]   Meperidine Hcl Itching   Metoclopramide Hcl Itching, Rash   Metoprolol  Succinate Anxiety, Other (See Comments)   Nervous and Allergic, per facility   Moxifloxacin Itching   Oxycodone -aspirin  Rash   Pentazocine Lactate Nausea Only  Pioglitazone Other (See Comments)   Bloating and Allergic, per facility   Trazodone  And Nefazodone Other (See Comments)   Urinary incontinence, stomach problems        Medication List     TAKE these medications    acetaminophen  325 MG tablet Commonly known as: TYLENOL  TAKE 2 TABS (650MG  DOSE) BY MOUTH EVERY 6 HOURS AS NEEDED FOR PAIN   albuterol  (2.5 MG/3ML) 0.083% nebulizer solution Commonly known as: PROVENTIL  Take 3 mLs (2.5 mg total) by nebulization every 12 (twelve) hours as needed for wheezing or shortness of breath.   Aspirin  Low Dose 81 MG chewable tablet Generic drug: aspirin  GIVE 1 TAB BY MOUTH ONCE DAILY What changed: See the new instructions.   atorvastatin  80 MG tablet Commonly known as: LIPITOR  GIVE 1 TAB BY MOUTH ONCE DAILY F/U APPT  DUE IN SEPT FOR FUTURE REFILLS What changed: See the new instructions.   clopidogrel  75 MG tablet Commonly known as: PLAVIX  TAKE 1 TABLET (75 MG TOTAL) BY MOUTH DAILY. F/U APPT DUE IN SEPT FOR FUTURE REFILLS What changed: additional instructions   donepezil  5 MG tablet Commonly known as: ARICEPT  Take 1 tablet (5 mg total) by mouth at bedtime.   DULoxetine  60 MG capsule Commonly known as: CYMBALTA  GIVE 1 CAP BY MOUTH ONCE DAILY F/U APPT DUE IN SEPT FOR FUTURE REFILLS What changed: See the new instructions.   glimepiride  1 MG tablet Commonly known as: AMARYL  TAKE 1 TABLET (1 MG TOTAL) BY MOUTH DAILY WITH BREAKFAST. F/U APPT DUE IN SEPT FOR FUTURE REFILLS What changed: additional instructions   levothyroxine  112 MCG tablet Commonly known as: SYNTHROID  Take 1 tablet (112 mcg total) by mouth 2 (two) times daily. What changed: when to take this   meclizine  25 MG tablet Commonly known as: ANTIVERT  Take 1 tablet (25 mg total) by mouth 3 (three) times daily as needed for dizziness.   metoprolol  tartrate 25 MG tablet Commonly known as: LOPRESSOR  TAKE 1 TAB BY MOUTH TWICE DAILY What changed: See the new instructions.   nitroGLYCERIN  0.4 MG SL tablet Commonly known as: NITROSTAT  TAKE 1 TAB SUBLINGUALLY EVERY 5 MINUTES FOR 3 DOSES AS NEEDED FOR CHEST PAIN. IF NO RELIEF CALL MD. What changed: See the new instructions.   OLANZapine  5 MG tablet Commonly known as: ZYPREXA  TAKE DAILY AT 4PM What changed: See the new instructions.   ondansetron  4 MG disintegrating tablet Commonly known as: ZOFRAN -ODT TAKE 1 TAB BY MOUTH EVERY 8 HOURS AS NEEDED FOR NAUSEA What changed: See the new instructions.   pantoprazole  40 MG tablet Commonly known as: PROTONIX  TAKE 1 TAB (40MG ) BY MOUTH ONCE DAILY What changed: See the new instructions.   promethazine  25 MG tablet Commonly known as: PHENERGAN  Take 1 tablet (25 mg total) by mouth every 6 (six) hours as needed for nausea or vomiting.    Symbicort  80-4.5 MCG/ACT inhaler Generic drug: budesonide-formoterol INHALE 2 PUFFS BY MOUTH 2 TIMES A DAY What changed: See the new instructions.        Contact information for follow-up providers     Geofm Glade PARAS, MD Follow up in 1 week(s).   Specialty: Internal Medicine Contact information: [address] [place] [address] [phone]              Contact information for after-discharge care     Destination     [facility], [place] .   Service: Skilled Nursing Contact information: [address] [place]  [phone]  Allergies[1]  The results of significant diagnostics from this hospitalization (including imaging, microbiology, ancillary and laboratory) are listed below for reference.    Microbiology: Recent Results (from the past 240 hours)  Resp panel by RT-PCR (RSV, Flu A&B, Covid) Anterior Nasal Swab     Status: Abnormal   Collection Time: 12/12/24  9:29 PM   Specimen: Anterior Nasal Swab  Result Value Ref Range Status   SARS Coronavirus 2 by RT PCR NEGATIVE NEGATIVE Final    Comment: (NOTE) SARS-CoV-2 target nucleic acids are NOT DETECTED.  The SARS-CoV-2 RNA is generally detectable in upper respiratory specimens during the acute phase of infection. The lowest concentration of SARS-CoV-2 viral copies this assay can detect is 138 copies/mL. A negative result does not preclude SARS-Cov-2 infection and should not be used as the sole basis for treatment or other patient management decisions. A negative result may occur with  improper specimen collection/handling, submission of specimen other than nasopharyngeal swab, presence of viral mutation(s) within the areas targeted by this assay, and inadequate number of viral copies(<138 copies/mL). A negative result must be combined with clinical observations, patient history, and epidemiological information. The expected result  is Negative.  Fact Sheet for Patients:  bloggercourse.com  Fact Sheet for Healthcare Providers:  seriousbroker.it  This test is no t yet approved or cleared by the [place]  FDA and  has been authorized for detection and/or diagnosis of SARS-CoV-2 by FDA under an Emergency Use Authorization (EUA). This EUA will remain  in effect (meaning this test can be used) for the duration of the COVID-19 declaration under Section 564(b)(1) of the Act, 21 U.S.C.section 360bbb-3(b)(1), unless the authorization is terminated  or revoked sooner.       Influenza A by PCR POSITIVE (A) NEGATIVE Final   Influenza B by PCR NEGATIVE NEGATIVE Final    Comment: (NOTE) The Xpert Xpress SARS-CoV-2/FLU/RSV plus assay is intended as an aid in the diagnosis of influenza from Nasopharyngeal swab specimens and should not be used as a sole basis for treatment. Nasal washings and aspirates are unacceptable for Xpert Xpress SARS-CoV-2/FLU/RSV testing.  Fact Sheet for Patients: bloggercourse.com  Fact Sheet for Healthcare Providers: seriousbroker.it  This test is not yet approved or cleared by the [place]  FDA and has been authorized for detection and/or diagnosis of SARS-CoV-2 by FDA under an Emergency Use Authorization (EUA). This EUA will remain in effect (meaning this test can be used) for the duration of the COVID-19 declaration under Section 564(b)(1) of the Act, 21 U.S.C. section 360bbb-3(b)(1), unless the authorization is terminated or revoked.     Resp Syncytial Virus by PCR NEGATIVE NEGATIVE Final    Comment: (NOTE) Fact Sheet for Patients: bloggercourse.com  Fact Sheet for Healthcare Providers: seriousbroker.it  This test is not yet approved or cleared by the [place]  FDA and has been authorized for detection and/or diagnosis of  SARS-CoV-2 by FDA under an Emergency Use Authorization (EUA). This EUA will remain in effect (meaning this test can be used) for the duration of the COVID-19 declaration under Section 564(b)(1) of the Act, 21 U.S.C. section 360bbb-3(b)(1), unless the authorization is terminated or revoked.  Performed at [facility], [address] [place], [place] [address]     Procedures/Studies: CT Chest Wo Contrast Result Date: 12/12/2024 EXAM: CT CHEST WITHOUT CONTRAST 12/12/2024 06:31:55 PM TECHNIQUE: CT of the chest was performed without the administration of intravenous contrast. Multiplanar reformatted images are provided for review. Automated exposure control, iterative reconstruction, and/or weight  based adjustment of the mA/kV was utilized to reduce the radiation dose to as low as reasonably achievable. COMPARISON: None available. CLINICAL HISTORY: sob, wheezing, effusion on xray FINDINGS: MEDIASTINUM: Heart is mildly enlarged. Patient is status post cardiac surgery. Left sided pacemaker is present. Coronary and aortic atherosclerotic calcifications are noted. The thyroid  gland is not well seen. There is a moderate sized hiatal hernia. The central airways are clear. LYMPH NODES: No mediastinal, hilar or axillary lymphadenopathy. LUNGS AND PLEURA: There is scarring in both lung apices. There are clustered tree-in-bud opacities in the posterior left upper lobe and lingula compatible with infectious/inflammatory process. There are numerous scattered diffuse pulmonary nodules measuring 4 mm or less seen throughout both lungs. There are some peripheral reticular opacities in the lung bases which are nonspecific. No pleural effusion or pneumothorax. SOFT TISSUES/BONES: Sternal wires are present. No acute abnormality of the bones or soft tissues. UPPER ABDOMEN: Limited images of the upper abdomen demonstrates no acute abnormality. IMPRESSION: 1. Clustered opacities in the posterior left upper  lobe and lingula compatible with an infectious or inflammatory process. 2. Numerous scattered diffuse pulmonary nodules measuring 4 mm or less throughout both lungs findings are indeterminate and may be infectious or inflammatory or related to granulomatous disease. Other etiologies such as metastatic disease are not excluded in the appropriate clinical setting. Consider short-term follow-up CT to reevaluate. 3. Peripheral reticular opacities in the lung bases, nonspecific. 4. Moderate sized hiatal hernia. Electronically signed by: Greig Pique MD MD 12/12/2024 06:46 PM EST RP Workstation: [id number]   DG Chest 1 View Result Date: 12/12/2024 EXAM: 1 VIEW(S) XRAY OF THE CHEST 12/12/2024 04:37:00 PM COMPARISON: 04/27/2024 CLINICAL HISTORY: sob FINDINGS: LUNGS AND PLEURA: No focal pulmonary opacity. No pleural effusion. No pneumothorax. HEART AND MEDIASTINUM: Stable left dual lead pacemaker with lead terminating in the right atrium and right ventricle. CABG changes noted. Aortic atherosclerosis. BONES AND SOFT TISSUES: Sternotomy wires noted. Multilevel thoracic osteophytosis. IMPRESSION: 1. No acute cardiopulmonary abnormality. Electronically signed by: Rogelia Myers MD MD 12/12/2024 04:41 PM EST RP Workstation: [id number]    Labs: BNP (last 3 results) No results for input(s): BNP in the last 8760 hours. Basic Metabolic Panel: Recent Labs  Lab 12/13/24 0520 12/14/24 0422 12/15/24 0425 12/16/24 0358 12/17/24 0348  NA 136 138 138 138 138  K 5.0 4.8 4.7 4.5 4.6  CL 99 101 106 105 104  CO2 22 23 21* 22 23  GLUCOSE 295* 96 102* 111* 113*  BUN 44* 57* 62* 46* 40*  CREATININE 2.43* 2.65* 2.21* 1.92* 1.88*  CALCIUM  8.8* 8.5* 8.0* 8.3* 8.7*  MG  --  2.7* 2.6* 2.6* 2.6*   Liver Function Tests: Recent Labs  Lab 12/13/24 0520  AST 25  ALT 17  ALKPHOS 116  BILITOT 0.4  PROT 6.6  ALBUMIN 3.7   No results for input(s): LIPASE, AMYLASE in the last 168 hours. No results for input(s):  AMMONIA in the last 168 hours. CBC: Recent Labs  Lab 12/12/24 1608 12/12/24 2130 12/13/24 0520 12/14/24 0422 12/15/24 0425 12/16/24 0358 12/17/24 0348  WBC 7.4   < > 5.7 11.0* 6.5 4.8 5.3  NEUTROABS 4.5  --   --   --   --   --   --   HGB 10.2*   < > 8.7* 9.1* 8.1* 8.2* 8.8*  HCT 32.6*   < > 27.8* 28.7* 25.4* 25.8* 27.4*  MCV 89.3   < > 88.3 86.2 85.8 85.7 86.2  PLT 231   < >  210 213 186 189 209   < > = values in this interval not displayed.   CBG: Recent Labs  Lab 12/17/24 1117 12/17/24 1704 12/17/24 2029 12/18/24 0742 12/18/24 1132  GLUCAP 127* 201* 169* 102* 98   Hgb A1c No results for input(s): HGBA1C in the last 72 hours. Anemia work up No results for input(s): VITAMINB12, FOLATE, FERRITIN, TIBC, IRON, RETICCTPCT in the last 72 hours. Cardiac Enzymes: No results for input(s): CKTOTAL, CKMB, CKMBINDEX, TROPONINI in the last 168 hours. BNP: Invalid input(s): POCBNP D-Dimer No results for input(s): DDIMER in the last 72 hours. Lipid Profile No results for input(s): CHOL, HDL, LDLCALC, TRIG, CHOLHDL, LDLDIRECT in the last 72 hours. Thyroid  function studies No results for input(s): TSH, T4TOTAL, T3FREE, THYROIDAB in the last 72 hours.  Invalid input(s): FREET3 Urinalysis    Component Value Date/Time   COLORURINE YELLOW 10/16/2021 1356   APPEARANCEUR CLEAR 10/16/2021 1356   LABSPEC 1.015 10/16/2021 1356   PHURINE 6.0 10/16/2021 1356   GLUCOSEU NEGATIVE 10/16/2021 1356   HGBUR SMALL (A) 10/16/2021 1356   HGBUR negative 01/30/2009 1506   BILIRUBINUR NEGATIVE 10/16/2021 1356   BILIRUBINUR 1+ 05/09/2020 1532   KETONESUR NEGATIVE 10/16/2021 1356   PROTEINUR Positive (A) 05/09/2020 1532   PROTEINUR NEGATIVE 04/04/2018 1538   UROBILINOGEN 0.2 10/16/2021 1356   NITRITE NEGATIVE 10/16/2021 1356   LEUKOCYTESUR LARGE (A) 10/16/2021 1356   Sepsis Labs Recent Labs  Lab 12/14/24 0422 12/15/24 0425 12/16/24 0358  12/17/24 0348  WBC 11.0* 6.5 4.8 5.3   Microbiology Recent Results (from the past 240 hours)  Resp panel by RT-PCR (RSV, Flu A&B, Covid) Anterior Nasal Swab     Status: Abnormal   Collection Time: 12/12/24  9:29 PM   Specimen: Anterior Nasal Swab  Result Value Ref Range Status   SARS Coronavirus 2 by RT PCR NEGATIVE NEGATIVE Final    Comment: (NOTE) SARS-CoV-2 target nucleic acids are NOT DETECTED.  The SARS-CoV-2 RNA is generally detectable in upper respiratory specimens during the acute phase of infection. The lowest concentration of SARS-CoV-2 viral copies this assay can detect is 138 copies/mL. A negative result does not preclude SARS-Cov-2 infection and should not be used as the sole basis for treatment or other patient management decisions. A negative result may occur with  improper specimen collection/handling, submission of specimen other than nasopharyngeal swab, presence of viral mutation(s) within the areas targeted by this assay, and inadequate number of viral copies(<138 copies/mL). A negative result must be combined with clinical observations, patient history, and epidemiological information. The expected result is Negative.  Fact Sheet for Patients:  bloggercourse.com  Fact Sheet for Healthcare Providers:  seriousbroker.it  This test is no t yet approved or cleared by the [place]  FDA and  has been authorized for detection and/or diagnosis of SARS-CoV-2 by FDA under an Emergency Use Authorization (EUA). This EUA will remain  in effect (meaning this test can be used) for the duration of the COVID-19 declaration under Section 564(b)(1) of the Act, 21 U.S.C.section 360bbb-3(b)(1), unless the authorization is terminated  or revoked sooner.       Influenza A by PCR POSITIVE (A) NEGATIVE Final   Influenza B by PCR NEGATIVE NEGATIVE Final    Comment: (NOTE) The Xpert Xpress SARS-CoV-2/FLU/RSV plus assay is  intended as an aid in the diagnosis of influenza from Nasopharyngeal swab specimens and should not be used as a sole basis for treatment. Nasal washings and aspirates are unacceptable for Xpert Xpress SARS-CoV-2/FLU/RSV testing.  Fact Sheet for Patients: bloggercourse.com  Fact Sheet for Healthcare Providers: seriousbroker.it  This test is not yet approved or cleared by the [place]  FDA and has been authorized for detection and/or diagnosis of SARS-CoV-2 by FDA under an Emergency Use Authorization (EUA). This EUA will remain in effect (meaning this test can be used) for the duration of the COVID-19 declaration under Section 564(b)(1) of the Act, 21 U.S.C. section 360bbb-3(b)(1), unless the authorization is terminated or revoked.     Resp Syncytial Virus by PCR NEGATIVE NEGATIVE Final    Comment: (NOTE) Fact Sheet for Patients: bloggercourse.com  Fact Sheet for Healthcare Providers: seriousbroker.it  This test is not yet approved or cleared by the [place]  FDA and has been authorized for detection and/or diagnosis of SARS-CoV-2 by FDA under an Emergency Use Authorization (EUA). This EUA will remain in effect (meaning this test can be used) for the duration of the COVID-19 declaration under Section 564(b)(1) of the Act, 21 U.S.C. section 360bbb-3(b)(1), unless the authorization is terminated or revoked.  Performed at [facility], [address], [place], [place] [address]      Time coordinating discharge: 35  minutes  SIGNED: Mennie LAMY, MD  [facility] 12/18/2024, 12:32 PM  If 7PM-7AM, please contact night-coverage [email or web address]       [1]  Allergies Allergen Reactions   Morphine Hives, Itching and Rash   Penicillins Itching and Rash    Has patient had a PCN reaction causing immediate rash, facial/tongue/throat swelling, SOB  or lightheadedness with hypotension: Yes Has patient had a PCN reaction causing severe rash involving mucus membranes or skin necrosis: No Has patient had a PCN reaction that required hospitalization No Has patient had a PCN reaction occurring within the last 10 years: No If all of the above answers are NO, then may proceed with Cephalosporin use.    Prednisone  Other (See Comments)    MEMORY LOSS and  Allergic, per facility   Sulfonamide Derivatives Itching and Rash   Oxycodone  Other (See Comments)     Allergic, per facility   Codeine Rash   Hydrocodone -Acetaminophen  Other (See Comments)    Allergies, per [facility]   Meperidine Hcl Itching   Metoclopramide Hcl Itching and Rash   Metoprolol  Succinate Anxiety and Other (See Comments)    Nervous and Allergic, per facility   Moxifloxacin Itching   Oxycodone -Aspirin  Rash   Pentazocine Lactate Nausea Only   Pioglitazone Other (See Comments)    Bloating and Allergic, per facility   Trazodone  And Nefazodone Other (See Comments)    Urinary incontinence, stomach problems

## 2024-12-19 ENCOUNTER — Ambulatory Visit: Admitting: Internal Medicine

## 2024-12-20 ENCOUNTER — Telehealth: Payer: Self-pay

## 2024-12-20 NOTE — Telephone Encounter (Signed)
It should be once a day

## 2024-12-20 NOTE — Telephone Encounter (Signed)
 Copied from CRM #[id number]. Topic: Clinical - Medication Question >> Dec 20, 2024 11:58 AM Rea ORN wrote: Reason for CRM: Apolinar with [facility] called to advise that the pt was discharged to their facility after her hospital stay. Apolinar said per discharge paperwork, pt should be taking levothyroxine  (SYNTHROID ) 112 MCG tablet twice a day. Their pharmacy, however, is stating it should only be once a day. Please call Apolinar back to clarify.  If calling before 2 pm: [phone] If calling after 2 pm: Cell # [phone]

## 2024-12-20 NOTE — Telephone Encounter (Signed)
 Spoke with Janet Steele today.

## 2024-12-24 ENCOUNTER — Ambulatory Visit: Payer: Self-pay

## 2024-12-24 DIAGNOSIS — I495 Sick sinus syndrome: Secondary | ICD-10-CM | POA: Diagnosis not present

## 2024-12-27 LAB — CUP PACEART REMOTE DEVICE CHECK
Battery Impedance: 3096 Ohm
Battery Remaining Longevity: 17 mo
Battery Voltage: 2.72 V
Brady Statistic AP VP Percent: 4 %
Brady Statistic AP VS Percent: 94 %
Brady Statistic AS VP Percent: 0 %
Brady Statistic AS VS Percent: 2 %
Date Time Interrogation Session: 20260121174235
Implantable Lead Connection Status: 753985
Implantable Lead Connection Status: 753985
Implantable Lead Implant Date: 20130801
Implantable Lead Implant Date: 20130801
Implantable Lead Location: 753859
Implantable Lead Location: 753860
Implantable Lead Model: 5076
Implantable Lead Model: 5092
Implantable Pulse Generator Implant Date: 20130801
Lead Channel Impedance Value: 532 Ohm
Lead Channel Impedance Value: 557 Ohm
Lead Channel Pacing Threshold Amplitude: 1.125 V
Lead Channel Pacing Threshold Amplitude: 1.375 V
Lead Channel Pacing Threshold Pulse Width: 0.4 ms
Lead Channel Pacing Threshold Pulse Width: 0.4 ms
Lead Channel Setting Pacing Amplitude: 2.5 V
Lead Channel Setting Pacing Amplitude: 2.75 V
Lead Channel Setting Pacing Pulse Width: 0.4 ms
Lead Channel Setting Sensing Sensitivity: 5.6 mV
Zone Setting Status: 755011
Zone Setting Status: 755011

## 2024-12-28 NOTE — Progress Notes (Signed)
 Remote PPM Transmission

## 2025-01-01 ENCOUNTER — Ambulatory Visit: Payer: Self-pay | Admitting: Cardiovascular Disease

## 2025-01-10 ENCOUNTER — Ambulatory Visit

## 2025-01-11 ENCOUNTER — Telehealth: Payer: Self-pay

## 2025-01-11 LAB — CUP PACEART REMOTE DEVICE CHECK
Battery Impedance: 2845 Ohm
Battery Remaining Longevity: 20 mo
Battery Voltage: 2.72 V
Brady Statistic AP VP Percent: 4 %
Brady Statistic AP VS Percent: 94 %
Brady Statistic AS VP Percent: 0 %
Brady Statistic AS VS Percent: 2 %
Date Time Interrogation Session: 20260205141525
Implantable Lead Connection Status: 753985
Implantable Lead Connection Status: 753985
Implantable Lead Implant Date: 20130801
Implantable Lead Implant Date: 20130801
Implantable Lead Location: 753859
Implantable Lead Location: 753860
Implantable Lead Model: 5076
Implantable Lead Model: 5092
Implantable Pulse Generator Implant Date: 20130801
Lead Channel Impedance Value: 551 Ohm
Lead Channel Impedance Value: 569 Ohm
Lead Channel Pacing Threshold Amplitude: 1.125 V
Lead Channel Pacing Threshold Amplitude: 1.375 V
Lead Channel Pacing Threshold Pulse Width: 0.4 ms
Lead Channel Pacing Threshold Pulse Width: 0.4 ms
Lead Channel Setting Pacing Amplitude: 2.5 V
Lead Channel Setting Pacing Amplitude: 2.75 V
Lead Channel Setting Pacing Pulse Width: 0.4 ms
Lead Channel Setting Sensing Sensitivity: 5.6 mV
Zone Setting Status: 755011
Zone Setting Status: 755011

## 2025-01-11 NOTE — Transitions of Care (Post Inpatient/ED Visit) (Signed)
 "  01/11/2025  Name: Janet Steele MRN: [id number] DOB: [birth year]  Today's TOC FU Call Status: Today's TOC FU Call Status:: Successful TOC FU Call Completed TOC FU Call Complete Date: 01/11/25  Patient's Name and Date of Birth confirmed. Name, DOB  Transition Care Management Follow-up Telephone Call Date of Discharge: 01/10/25 Discharge Facility: Other ([facility]) Name of Other ([facility]) Discharge Facility: [facility] Type of Discharge: Inpatient Admission Primary Inpatient Discharge Diagnosis:: muscle weakness How have you been since you were released from the hospital?: Better Any questions or concerns?: No  Items Reviewed: Did you receive and understand the discharge instructions provided?: No Medications obtained,verified, and reconciled?: Yes (Medications Reviewed) Any new allergies since your discharge?: No Dietary orders reviewed?: Yes Do you have support at home?: Yes People in Home [RPT]: spouse  Medications Reviewed Today: Medications Reviewed Today     Reviewed by Emmitt Pan, LPN (Licensed Practical Nurse) on 01/11/25 at [phone]  Med List Status: <None>   Medication Order Taking? Sig Documenting Provider Last Dose Status Informant  acetaminophen  (TYLENOL ) 325 MG tablet [id number] Yes TAKE 2 TABS (650MG  DOSE) BY MOUTH EVERY 6 HOURS AS NEEDED FOR PAIN Burns, Glade PARAS, MD  Active Nursing Home Medication Administration Guide (MAG)  albuterol  (PROVENTIL ) (2.5 MG/3ML) 0.083% nebulizer solution [id number] Yes Take 3 mLs (2.5 mg total) by nebulization every 12 (twelve) hours as needed for wheezing or shortness of breath. Geofm Glade PARAS, MD  Active Nursing Home Medication Administration Guide (MAG)  ASPIRIN  LOW DOSE 81 MG chewable tablet [id number] Yes GIVE 1 TAB BY MOUTH ONCE DAILY  Patient taking differently: Chew 81 mg by mouth in the morning.   Geofm Glade PARAS, MD  Active Nursing Home Medication Administration Guide (MAG)  atorvastatin  (LIPITOR ) 80 MG tablet [id number]  Yes GIVE 1 TAB BY MOUTH ONCE DAILY F/U APPT DUE IN SEPT FOR FUTURE REFILLS  Patient taking differently: Take 80 mg by mouth daily.   Geofm Glade PARAS, MD  Active Nursing Home Medication Administration Guide (MAG)  clopidogrel  (PLAVIX ) 75 MG tablet [id number] Yes TAKE 1 TABLET (75 MG TOTAL) BY MOUTH DAILY. F/U APPT DUE IN SEPT FOR FUTURE REFILLS  Patient taking differently: Take 75 mg by mouth daily.   Geofm Glade PARAS, MD  Active Nursing Home Medication Administration Guide (MAG)  donepezil  (ARICEPT ) 5 MG tablet [id number] Yes Take 1 tablet (5 mg total) by mouth at bedtime. Geofm Glade PARAS, MD  Active Nursing Home Medication Administration Guide (MAG)  DULoxetine  (CYMBALTA ) 60 MG capsule [id number] Yes GIVE 1 CAP BY MOUTH ONCE DAILY F/U APPT DUE IN SEPT FOR FUTURE REFILLS  Patient taking differently: Take 60 mg by mouth in the morning.   Geofm Glade PARAS, MD  Active Nursing Home Medication Administration Guide (MAG)  glimepiride  (AMARYL ) 1 MG tablet [id number] Yes TAKE 1 TABLET (1 MG TOTAL) BY MOUTH DAILY WITH BREAKFAST. F/U APPT DUE IN SEPT FOR FUTURE REFILLS  Patient taking differently: Take 1 mg by mouth daily with breakfast.   Geofm Glade PARAS, MD  Active Nursing Home Medication Administration Guide (MAG)  levothyroxine  (SYNTHROID ) 112 MCG tablet [id number] Yes Take 1 tablet (112 mcg total) by mouth 2 (two) times daily.  Patient taking differently: Take 112 mcg by mouth daily before breakfast.   Geofm Glade PARAS, MD  Active Nursing Home Medication Administration Guide (MAG)  meclizine  (ANTIVERT ) 25 MG tablet [id number] Yes Take 1 tablet (25 mg total) by mouth 3 (three) times daily as needed for dizziness. Emil Share, DO  Active Nursing Home Medication Administration Guide (MAG)  metoprolol  tartrate (LOPRESSOR ) 25 MG tablet [id number] Yes TAKE 1 TAB BY MOUTH TWICE DAILY  Patient taking differently: Take 25 mg by mouth in the morning and at bedtime.   Geofm Glade PARAS, MD  Active Nursing Home Medication  Administration Guide (MAG)  nitroGLYCERIN  (NITROSTAT ) 0.4 MG SL tablet [id number] Yes TAKE 1 TAB SUBLINGUALLY EVERY 5 MINUTES FOR 3 DOSES AS NEEDED FOR CHEST PAIN. IF NO RELIEF CALL MD.  Patient taking differently: Place 0.4 mg under the tongue every 5 (five) minutes x 3 doses as needed for chest pain.   Geofm Glade PARAS, MD  Active Nursing Home Medication Administration Guide (MAG)  OLANZapine  (ZYPREXA ) 5 MG tablet [id number] Yes TAKE DAILY AT 4PM  Patient taking differently: Take 5 mg by mouth daily at 4 PM.   Geofm Glade PARAS, MD  Active Nursing Home Medication Administration Guide (MAG)  ondansetron  (ZOFRAN -ODT) 4 MG disintegrating tablet [id number] Yes TAKE 1 TAB BY MOUTH EVERY 8 HOURS AS NEEDED FOR NAUSEA  Patient taking differently: Take 4 mg by mouth every 8 (eight) hours as needed for nausea (dissolve orally).   Geofm Glade PARAS, MD  Active Nursing Home Medication Administration Guide (MAG)  pantoprazole  (PROTONIX ) 40 MG tablet [id number] Yes TAKE 1 TAB (40MG ) BY MOUTH ONCE DAILY  Patient taking differently: Take 40 mg by mouth in the morning.   Geofm Glade PARAS, MD  Active Nursing Home Medication Administration Guide (MAG)  promethazine  (PHENERGAN ) 25 MG tablet [id number] Yes Take 1 tablet (25 mg total) by mouth every 6 (six) hours as needed for nausea or vomiting. Emil Share, DO  Active Nursing Home Medication Administration Guide (MAG)  SYMBICORT  80-4.5 MCG/ACT inhaler [id number] Yes INHALE 2 PUFFS BY MOUTH 2 TIMES A DAY  Patient taking differently: Inhale 2 puffs into the lungs in the morning and at bedtime.   Geofm Glade PARAS, MD  Active Nursing Home Medication Administration Guide (MAG)  Med List Note Marisa Nathanel LOISE Bishop 12/12/24 2001): [facility] @  [phone]            Home Care and Equipment/Supplies: Were Home Health Services Ordered?: Yes Name of Home Health Agency:: unknown Has Agency set up a time to come to your home?: Yes First Home Health Visit Date: 01/11/25 Any new equipment  or medical supplies ordered?: NA  Functional Questionnaire: Do you need assistance with bathing/showering or dressing?: Yes Do you need assistance with meal preparation?: Yes Do you need assistance with eating?: No Do you have difficulty maintaining continence: No Do you need assistance with getting out of bed/getting out of a chair/moving?: Yes Do you have difficulty managing or taking your medications?: Yes  Follow up appointments reviewed: PCP Follow-up appointment confirmed?: No (will call back to schedule) MD Provider Line Number:[phone] Given: No Specialist Hospital Follow-up appointment confirmed?: NA Do you need transportation to your follow-up appointment?: No Do you understand care options if your condition(s) worsen?: Yes-patient verbalized understanding    SIGNATURE Julian Lemmings, LPN [facility] Nurse Health Advisor Direct Dial [phone]  "

## 2025-01-24 ENCOUNTER — Ambulatory Visit

## 2025-02-10 ENCOUNTER — Ambulatory Visit

## 2025-02-24 ENCOUNTER — Ambulatory Visit

## 2025-05-23 ENCOUNTER — Ambulatory Visit
